# Patient Record
Sex: Female | Born: 1940 | Race: Black or African American | Hispanic: No | Smoking: Former smoker
Health system: Southern US, Community
[De-identification: ages and names within clinical notes are randomized; demographics above are authoritative.]

## PROBLEM LIST (undated history)

## (undated) DIAGNOSIS — S82891A Other fracture of right lower leg, initial encounter for closed fracture: Secondary | ICD-10-CM

## (undated) DIAGNOSIS — D649 Anemia, unspecified: Secondary | ICD-10-CM

## (undated) DIAGNOSIS — M51369 Other intervertebral disc degeneration, lumbar region without mention of lumbar back pain or lower extremity pain: Secondary | ICD-10-CM

## (undated) DIAGNOSIS — R269 Unspecified abnormalities of gait and mobility: Secondary | ICD-10-CM

## (undated) DIAGNOSIS — S42302A Unspecified fracture of shaft of humerus, left arm, initial encounter for closed fracture: Secondary | ICD-10-CM

## (undated) DIAGNOSIS — M5136 Other intervertebral disc degeneration, lumbar region: Secondary | ICD-10-CM

## (undated) DIAGNOSIS — M545 Low back pain, unspecified: Secondary | ICD-10-CM

## (undated) DIAGNOSIS — Z66 Do not resuscitate: Secondary | ICD-10-CM

## (undated) DIAGNOSIS — G4730 Sleep apnea, unspecified: Secondary | ICD-10-CM

## (undated) DIAGNOSIS — G4733 Obstructive sleep apnea (adult) (pediatric): Secondary | ICD-10-CM

## (undated) DIAGNOSIS — R590 Localized enlarged lymph nodes: Secondary | ICD-10-CM

## (undated) DIAGNOSIS — M5030 Other cervical disc degeneration, unspecified cervical region: Secondary | ICD-10-CM

## (undated) DIAGNOSIS — R413 Other amnesia: Secondary | ICD-10-CM

## (undated) DIAGNOSIS — M1990 Unspecified osteoarthritis, unspecified site: Secondary | ICD-10-CM

## (undated) DIAGNOSIS — R739 Hyperglycemia, unspecified: Secondary | ICD-10-CM

## (undated) DIAGNOSIS — L700 Acne vulgaris: Secondary | ICD-10-CM

## (undated) DIAGNOSIS — Z9889 Other specified postprocedural states: Secondary | ICD-10-CM

## (undated) DIAGNOSIS — F329 Major depressive disorder, single episode, unspecified: Secondary | ICD-10-CM

## (undated) DIAGNOSIS — F0390 Unspecified dementia without behavioral disturbance: Secondary | ICD-10-CM

## (undated) DIAGNOSIS — Z9989 Dependence on other enabling machines and devices: Secondary | ICD-10-CM

## (undated) DIAGNOSIS — F32A Depression, unspecified: Secondary | ICD-10-CM

## (undated) DIAGNOSIS — K219 Gastro-esophageal reflux disease without esophagitis: Secondary | ICD-10-CM

## (undated) DIAGNOSIS — I10 Essential (primary) hypertension: Secondary | ICD-10-CM

## (undated) DIAGNOSIS — F419 Anxiety disorder, unspecified: Secondary | ICD-10-CM

## (undated) DIAGNOSIS — J449 Chronic obstructive pulmonary disease, unspecified: Secondary | ICD-10-CM

## (undated) DIAGNOSIS — B351 Tinea unguium: Secondary | ICD-10-CM

## (undated) HISTORY — DX: Major depressive disorder, single episode, unspecified: F32.9

## (undated) HISTORY — PX: OTHER SURGICAL HISTORY: SHX169

## (undated) HISTORY — DX: Unspecified abnormalities of gait and mobility: R26.9

## (undated) HISTORY — DX: Dependence on other enabling machines and devices: Z99.89

## (undated) HISTORY — DX: Other specified postprocedural states: Z98.890

## (undated) HISTORY — DX: Depression, unspecified: F32.A

## (undated) HISTORY — DX: Unspecified osteoarthritis, unspecified site: M19.90

## (undated) HISTORY — DX: Other intervertebral disc degeneration, lumbar region: M51.36

## (undated) HISTORY — DX: Low back pain: M54.5

## (undated) HISTORY — DX: Chronic obstructive pulmonary disease, unspecified: J44.9

## (undated) HISTORY — DX: Low back pain, unspecified: M54.50

## (undated) HISTORY — DX: Obstructive sleep apnea (adult) (pediatric): G47.33

## (undated) HISTORY — DX: Anxiety disorder, unspecified: F41.9

## (undated) HISTORY — DX: Essential (primary) hypertension: I10

## (undated) HISTORY — DX: Gastro-esophageal reflux disease without esophagitis: K21.9

## (undated) HISTORY — DX: Acne vulgaris: L70.0

## (undated) HISTORY — DX: Hyperglycemia, unspecified: R73.9

## (undated) HISTORY — DX: Other amnesia: R41.3

## (undated) HISTORY — DX: Anemia, unspecified: D64.9

## (undated) HISTORY — DX: Localized enlarged lymph nodes: R59.0

## (undated) HISTORY — DX: Other cervical disc degeneration, unspecified cervical region: M50.30

## (undated) HISTORY — DX: Unspecified fracture of shaft of humerus, left arm, initial encounter for closed fracture: S42.302A

## (undated) HISTORY — PX: APPENDECTOMY: SHX54

## (undated) HISTORY — DX: Other intervertebral disc degeneration, lumbar region without mention of lumbar back pain or lower extremity pain: M51.369

## (undated) HISTORY — DX: Tinea unguium: B35.1

## (undated) HISTORY — DX: Other fracture of right lower leg, initial encounter for closed fracture: S82.891A

---

## 1974-11-15 HISTORY — PX: OOPHORECTOMY: SHX86

## 1974-11-15 HISTORY — PX: ABDOMINAL HYSTERECTOMY: SHX81

## 2001-11-01 ENCOUNTER — Ambulatory Visit (HOSPITAL_COMMUNITY): Admission: RE | Admit: 2001-11-01 | Discharge: 2001-11-01 | Payer: Self-pay | Admitting: Family Medicine

## 2001-11-01 ENCOUNTER — Encounter: Payer: Self-pay | Admitting: Family Medicine

## 2002-07-09 ENCOUNTER — Emergency Department (HOSPITAL_COMMUNITY): Admission: EM | Admit: 2002-07-09 | Discharge: 2002-07-09 | Payer: Self-pay

## 2002-11-05 ENCOUNTER — Encounter: Payer: Self-pay | Admitting: Family Medicine

## 2002-11-05 ENCOUNTER — Ambulatory Visit (HOSPITAL_COMMUNITY): Admission: RE | Admit: 2002-11-05 | Discharge: 2002-11-05 | Payer: Self-pay | Admitting: Family Medicine

## 2004-09-29 ENCOUNTER — Ambulatory Visit (HOSPITAL_COMMUNITY): Admission: RE | Admit: 2004-09-29 | Discharge: 2004-09-29 | Payer: Self-pay | Admitting: Neurology

## 2004-11-24 ENCOUNTER — Ambulatory Visit (HOSPITAL_COMMUNITY): Admission: RE | Admit: 2004-11-24 | Discharge: 2004-11-24 | Payer: Self-pay | Admitting: Family Medicine

## 2005-01-06 ENCOUNTER — Ambulatory Visit (HOSPITAL_COMMUNITY): Admission: RE | Admit: 2005-01-06 | Discharge: 2005-01-06 | Payer: Self-pay | Admitting: Neurology

## 2005-03-04 ENCOUNTER — Encounter (INDEPENDENT_AMBULATORY_CARE_PROVIDER_SITE_OTHER): Payer: Self-pay | Admitting: Internal Medicine

## 2005-04-07 ENCOUNTER — Emergency Department (HOSPITAL_COMMUNITY): Admission: EM | Admit: 2005-04-07 | Discharge: 2005-04-07 | Payer: Self-pay | Admitting: Emergency Medicine

## 2005-06-22 ENCOUNTER — Ambulatory Visit (HOSPITAL_COMMUNITY): Admission: RE | Admit: 2005-06-22 | Discharge: 2005-06-22 | Payer: Self-pay | Admitting: Family Medicine

## 2005-10-11 ENCOUNTER — Emergency Department (HOSPITAL_COMMUNITY): Admission: EM | Admit: 2005-10-11 | Discharge: 2005-10-11 | Payer: Self-pay | Admitting: Emergency Medicine

## 2005-10-14 LAB — CONVERTED CEMR LAB
Cholesterol: 229 mg/dL
HDL: 51 mg/dL
LDL Cholesterol: 136 mg/dL
Triglycerides: 211 mg/dL

## 2005-11-03 ENCOUNTER — Ambulatory Visit: Payer: Self-pay | Admitting: Orthopedic Surgery

## 2005-12-27 ENCOUNTER — Ambulatory Visit (HOSPITAL_COMMUNITY): Admission: RE | Admit: 2005-12-27 | Discharge: 2005-12-27 | Payer: Self-pay | Admitting: Family Medicine

## 2006-02-27 ENCOUNTER — Encounter (INDEPENDENT_AMBULATORY_CARE_PROVIDER_SITE_OTHER): Payer: Self-pay | Admitting: Internal Medicine

## 2006-03-01 LAB — CONVERTED CEMR LAB
Basophils Absolute: 0.1 10*3/uL
Hemoglobin: 12.8 g/dL
Lymphocytes Relative: 26 %
Monocytes Absolute: 0.5 10*3/uL
Neutro Abs: 4.8 10*3/uL
Neutrophils Relative %: 64.7 %
Platelets: 303 10*3/uL
RDW: 14.8 %

## 2006-05-21 ENCOUNTER — Emergency Department (HOSPITAL_COMMUNITY): Admission: EM | Admit: 2006-05-21 | Discharge: 2006-05-21 | Payer: Self-pay | Admitting: Emergency Medicine

## 2006-06-23 ENCOUNTER — Ambulatory Visit: Payer: Self-pay | Admitting: Internal Medicine

## 2006-08-04 ENCOUNTER — Ambulatory Visit: Payer: Self-pay | Admitting: Internal Medicine

## 2006-08-04 LAB — CONVERTED CEMR LAB
AST: 13 units/L
Albumin: 4.2 g/dL
BUN: 20 mg/dL
Calcium: 9.4 mg/dL
Chloride: 100 meq/L
Potassium: 3.4 meq/L

## 2006-08-08 ENCOUNTER — Encounter (INDEPENDENT_AMBULATORY_CARE_PROVIDER_SITE_OTHER): Payer: Self-pay | Admitting: Internal Medicine

## 2006-09-12 ENCOUNTER — Ambulatory Visit: Payer: Self-pay | Admitting: Internal Medicine

## 2006-10-05 DIAGNOSIS — M545 Low back pain: Secondary | ICD-10-CM

## 2006-10-05 DIAGNOSIS — J309 Allergic rhinitis, unspecified: Secondary | ICD-10-CM | POA: Insufficient documentation

## 2006-10-05 DIAGNOSIS — I10 Essential (primary) hypertension: Secondary | ICD-10-CM | POA: Insufficient documentation

## 2006-10-05 DIAGNOSIS — K219 Gastro-esophageal reflux disease without esophagitis: Secondary | ICD-10-CM

## 2006-10-05 DIAGNOSIS — F411 Generalized anxiety disorder: Secondary | ICD-10-CM | POA: Insufficient documentation

## 2006-10-05 DIAGNOSIS — D649 Anemia, unspecified: Secondary | ICD-10-CM

## 2006-10-05 DIAGNOSIS — B351 Tinea unguium: Secondary | ICD-10-CM

## 2006-10-05 DIAGNOSIS — M159 Polyosteoarthritis, unspecified: Secondary | ICD-10-CM | POA: Insufficient documentation

## 2006-10-05 DIAGNOSIS — M129 Arthropathy, unspecified: Secondary | ICD-10-CM | POA: Insufficient documentation

## 2006-10-05 DIAGNOSIS — G4733 Obstructive sleep apnea (adult) (pediatric): Secondary | ICD-10-CM

## 2006-10-05 DIAGNOSIS — J441 Chronic obstructive pulmonary disease with (acute) exacerbation: Secondary | ICD-10-CM | POA: Insufficient documentation

## 2006-10-26 ENCOUNTER — Ambulatory Visit: Payer: Self-pay | Admitting: Internal Medicine

## 2006-10-28 ENCOUNTER — Encounter (INDEPENDENT_AMBULATORY_CARE_PROVIDER_SITE_OTHER): Payer: Self-pay | Admitting: Internal Medicine

## 2006-11-28 ENCOUNTER — Encounter (INDEPENDENT_AMBULATORY_CARE_PROVIDER_SITE_OTHER): Payer: Self-pay | Admitting: Internal Medicine

## 2007-01-05 ENCOUNTER — Ambulatory Visit (HOSPITAL_COMMUNITY): Admission: RE | Admit: 2007-01-05 | Discharge: 2007-01-05 | Payer: Self-pay | Admitting: Internal Medicine

## 2007-01-06 ENCOUNTER — Encounter (INDEPENDENT_AMBULATORY_CARE_PROVIDER_SITE_OTHER): Payer: Self-pay | Admitting: Internal Medicine

## 2007-01-25 ENCOUNTER — Ambulatory Visit: Payer: Self-pay | Admitting: Internal Medicine

## 2007-01-25 DIAGNOSIS — E785 Hyperlipidemia, unspecified: Secondary | ICD-10-CM | POA: Insufficient documentation

## 2007-02-15 ENCOUNTER — Ambulatory Visit: Payer: Self-pay | Admitting: Internal Medicine

## 2007-02-24 ENCOUNTER — Telehealth (INDEPENDENT_AMBULATORY_CARE_PROVIDER_SITE_OTHER): Payer: Self-pay | Admitting: *Deleted

## 2007-03-17 ENCOUNTER — Ambulatory Visit: Payer: Self-pay | Admitting: Internal Medicine

## 2007-03-20 ENCOUNTER — Telehealth (INDEPENDENT_AMBULATORY_CARE_PROVIDER_SITE_OTHER): Payer: Self-pay | Admitting: *Deleted

## 2007-03-28 ENCOUNTER — Ambulatory Visit: Payer: Self-pay | Admitting: Internal Medicine

## 2007-04-25 ENCOUNTER — Ambulatory Visit: Payer: Self-pay | Admitting: Internal Medicine

## 2007-05-01 ENCOUNTER — Encounter (INDEPENDENT_AMBULATORY_CARE_PROVIDER_SITE_OTHER): Payer: Self-pay | Admitting: Internal Medicine

## 2007-05-23 ENCOUNTER — Ambulatory Visit: Payer: Self-pay | Admitting: Internal Medicine

## 2007-06-20 ENCOUNTER — Ambulatory Visit: Payer: Self-pay | Admitting: Internal Medicine

## 2007-06-20 DIAGNOSIS — R0902 Hypoxemia: Secondary | ICD-10-CM

## 2007-07-24 ENCOUNTER — Ambulatory Visit: Payer: Self-pay | Admitting: Orthopedic Surgery

## 2007-08-22 ENCOUNTER — Ambulatory Visit: Payer: Self-pay | Admitting: Internal Medicine

## 2007-08-22 ENCOUNTER — Inpatient Hospital Stay (HOSPITAL_COMMUNITY): Admission: EM | Admit: 2007-08-22 | Discharge: 2007-08-25 | Payer: Self-pay | Admitting: Emergency Medicine

## 2007-08-22 DIAGNOSIS — R0602 Shortness of breath: Secondary | ICD-10-CM

## 2007-08-22 LAB — CONVERTED CEMR LAB
ALT: 36 units/L — ABNORMAL HIGH (ref 0–35)
Alkaline Phosphatase: 113 units/L (ref 39–117)
Basophils Relative: 1 % (ref 0–1)
Creatinine, Ser: 0.85 mg/dL (ref 0.40–1.20)
Eosinophils Absolute: 0 10*3/uL (ref 0.0–0.7)
Eosinophils Relative: 0 % (ref 0–5)
Glucose, Bld: 111 mg/dL — ABNORMAL HIGH (ref 70–99)
MCHC: 29.9 g/dL — ABNORMAL LOW (ref 30.0–36.0)
MCV: 73.9 fL — ABNORMAL LOW (ref 78.0–100.0)
Neutrophils Relative %: 65 % (ref 43–77)
Platelets: 343 10*3/uL (ref 150–400)
RDW: 18.5 % — ABNORMAL HIGH (ref 11.5–14.0)
Sodium: 139 meq/L (ref 135–145)
Total Bilirubin: 0.4 mg/dL (ref 0.3–1.2)
Total Protein: 6.6 g/dL (ref 6.0–8.3)

## 2007-09-04 ENCOUNTER — Telehealth (INDEPENDENT_AMBULATORY_CARE_PROVIDER_SITE_OTHER): Payer: Self-pay | Admitting: *Deleted

## 2007-09-11 ENCOUNTER — Ambulatory Visit: Payer: Self-pay | Admitting: Internal Medicine

## 2007-09-11 DIAGNOSIS — R7309 Other abnormal glucose: Secondary | ICD-10-CM | POA: Insufficient documentation

## 2007-09-20 ENCOUNTER — Ambulatory Visit (HOSPITAL_COMMUNITY): Admission: RE | Admit: 2007-09-20 | Discharge: 2007-09-20 | Payer: Self-pay | Admitting: Internal Medicine

## 2007-09-20 ENCOUNTER — Encounter (INDEPENDENT_AMBULATORY_CARE_PROVIDER_SITE_OTHER): Payer: Self-pay | Admitting: Internal Medicine

## 2007-09-21 ENCOUNTER — Encounter (INDEPENDENT_AMBULATORY_CARE_PROVIDER_SITE_OTHER): Payer: Self-pay | Admitting: Internal Medicine

## 2007-09-25 ENCOUNTER — Telehealth (INDEPENDENT_AMBULATORY_CARE_PROVIDER_SITE_OTHER): Payer: Self-pay | Admitting: Internal Medicine

## 2007-09-25 LAB — CONVERTED CEMR LAB
BUN: 15 mg/dL (ref 6–23)
CO2: 27 meq/L (ref 19–32)
Chloride: 102 meq/L (ref 96–112)
Eosinophils Absolute: 0.2 10*3/uL (ref 0.0–0.7)
Ferritin: 33 ng/mL (ref 10–291)
Glucose, Bld: 90 mg/dL (ref 70–99)
Lymphocytes Relative: 27 % (ref 12–46)
Lymphs Abs: 1.4 10*3/uL (ref 0.7–3.3)
MCV: 76.2 fL — ABNORMAL LOW (ref 78.0–100.0)
Monocytes Relative: 5 % (ref 3–11)
Neutrophils Relative %: 65 % (ref 43–77)
Platelets: 233 10*3/uL (ref 150–400)
Potassium: 4.3 meq/L (ref 3.5–5.3)
RBC: 5.25 M/uL — ABNORMAL HIGH (ref 3.87–5.11)
Saturation Ratios: 25 % (ref 20–55)
Sodium: 142 meq/L (ref 135–145)
UIBC: 282 ug/dL
WBC: 5.4 10*3/uL (ref 4.0–10.5)

## 2007-09-27 ENCOUNTER — Encounter (INDEPENDENT_AMBULATORY_CARE_PROVIDER_SITE_OTHER): Payer: Self-pay | Admitting: Internal Medicine

## 2007-09-28 ENCOUNTER — Encounter (INDEPENDENT_AMBULATORY_CARE_PROVIDER_SITE_OTHER): Payer: Self-pay | Admitting: Internal Medicine

## 2007-10-31 ENCOUNTER — Ambulatory Visit: Payer: Self-pay | Admitting: Internal Medicine

## 2007-11-01 ENCOUNTER — Telehealth (INDEPENDENT_AMBULATORY_CARE_PROVIDER_SITE_OTHER): Payer: Self-pay | Admitting: *Deleted

## 2007-11-01 LAB — CONVERTED CEMR LAB
BUN: 18 mg/dL (ref 6–23)
Basophils Absolute: 0 10*3/uL (ref 0.0–0.1)
Basophils Relative: 0 % (ref 0–1)
Calcium: 9.5 mg/dL (ref 8.4–10.5)
Chloride: 103 meq/L (ref 96–112)
Creatinine, Ser: 0.77 mg/dL (ref 0.40–1.20)
Eosinophils Absolute: 0.1 10*3/uL — ABNORMAL LOW (ref 0.2–0.7)
Eosinophils Relative: 2 % (ref 0–5)
HCT: 39.6 % (ref 36.0–46.0)
Hemoglobin: 11.5 g/dL — ABNORMAL LOW (ref 12.0–15.0)
MCHC: 29 g/dL — ABNORMAL LOW (ref 30.0–36.0)
MCV: 77.6 fL — ABNORMAL LOW (ref 78.0–100.0)
Monocytes Absolute: 0.3 10*3/uL (ref 0.1–1.0)
Monocytes Relative: 5 % (ref 3–12)
Neutro Abs: 3.6 10*3/uL (ref 1.7–7.7)
RBC: 5.1 M/uL (ref 3.87–5.11)
RDW: 23.9 % — ABNORMAL HIGH (ref 11.5–15.5)

## 2007-11-06 ENCOUNTER — Telehealth (INDEPENDENT_AMBULATORY_CARE_PROVIDER_SITE_OTHER): Payer: Self-pay | Admitting: Internal Medicine

## 2007-11-16 DIAGNOSIS — R590 Localized enlarged lymph nodes: Secondary | ICD-10-CM

## 2007-11-16 HISTORY — DX: Localized enlarged lymph nodes: R59.0

## 2007-11-23 ENCOUNTER — Ambulatory Visit (HOSPITAL_COMMUNITY): Admission: RE | Admit: 2007-11-23 | Discharge: 2007-11-23 | Payer: Self-pay | Admitting: Internal Medicine

## 2007-12-18 ENCOUNTER — Ambulatory Visit: Payer: Self-pay | Admitting: Internal Medicine

## 2007-12-18 DIAGNOSIS — J441 Chronic obstructive pulmonary disease with (acute) exacerbation: Secondary | ICD-10-CM

## 2007-12-18 DIAGNOSIS — K117 Disturbances of salivary secretion: Secondary | ICD-10-CM

## 2008-01-03 ENCOUNTER — Encounter (INDEPENDENT_AMBULATORY_CARE_PROVIDER_SITE_OTHER): Payer: Self-pay | Admitting: Internal Medicine

## 2008-01-11 ENCOUNTER — Telehealth (INDEPENDENT_AMBULATORY_CARE_PROVIDER_SITE_OTHER): Payer: Self-pay | Admitting: *Deleted

## 2008-01-11 ENCOUNTER — Ambulatory Visit (HOSPITAL_COMMUNITY): Admission: RE | Admit: 2008-01-11 | Discharge: 2008-01-11 | Payer: Self-pay | Admitting: Internal Medicine

## 2008-01-16 ENCOUNTER — Telehealth (INDEPENDENT_AMBULATORY_CARE_PROVIDER_SITE_OTHER): Payer: Self-pay | Admitting: *Deleted

## 2008-01-18 ENCOUNTER — Ambulatory Visit: Payer: Self-pay | Admitting: Internal Medicine

## 2008-01-18 DIAGNOSIS — M5430 Sciatica, unspecified side: Secondary | ICD-10-CM | POA: Insufficient documentation

## 2008-02-16 ENCOUNTER — Ambulatory Visit: Payer: Self-pay | Admitting: Internal Medicine

## 2008-02-28 ENCOUNTER — Encounter (INDEPENDENT_AMBULATORY_CARE_PROVIDER_SITE_OTHER): Payer: Self-pay | Admitting: Internal Medicine

## 2008-03-26 ENCOUNTER — Ambulatory Visit: Payer: Self-pay | Admitting: Internal Medicine

## 2008-05-16 ENCOUNTER — Ambulatory Visit: Payer: Self-pay | Admitting: Internal Medicine

## 2008-05-20 LAB — CONVERTED CEMR LAB
BUN: 21 mg/dL (ref 6–23)
CO2: 28 meq/L (ref 19–32)
Calcium: 9.6 mg/dL (ref 8.4–10.5)
Chloride: 105 meq/L (ref 96–112)
Cholesterol: 250 mg/dL — ABNORMAL HIGH (ref 0–200)
Creatinine, Ser: 0.84 mg/dL (ref 0.40–1.20)
Glucose, Bld: 95 mg/dL (ref 70–99)
HDL: 60 mg/dL (ref >39)
LDL Cholesterol: 166 mg/dL — ABNORMAL HIGH (ref 0–99)
Potassium: 4.1 meq/L (ref 3.5–5.3)
Sodium: 143 meq/L (ref 135–145)
TSH: 0.372 microintl units/mL (ref 0.350–4.50)
Total CHOL/HDL Ratio: 4.2
Triglycerides: 120 mg/dL (ref <150)
VLDL: 24 mg/dL (ref 0–40)

## 2008-07-04 ENCOUNTER — Ambulatory Visit: Payer: Self-pay | Admitting: Internal Medicine

## 2008-07-04 DIAGNOSIS — L708 Other acne: Secondary | ICD-10-CM

## 2008-07-08 ENCOUNTER — Encounter (INDEPENDENT_AMBULATORY_CARE_PROVIDER_SITE_OTHER): Payer: Self-pay | Admitting: Internal Medicine

## 2008-07-08 LAB — CONVERTED CEMR LAB
Basophils Absolute: 0 10*3/uL (ref 0.0–0.1)
Basophils Relative: 1 % (ref 0–1)
Eosinophils Absolute: 0.1 10*3/uL (ref 0.0–0.7)
Hemoglobin: 11.3 g/dL — ABNORMAL LOW (ref 12.0–15.0)
MCHC: 29.5 g/dL — ABNORMAL LOW (ref 30.0–36.0)
MCV: 83.3 fL (ref 78.0–100.0)
Monocytes Absolute: 0.3 10*3/uL (ref 0.1–1.0)
Monocytes Relative: 5 % (ref 3–12)
RBC: 4.6 M/uL (ref 3.87–5.11)
RDW: 15.5 % (ref 11.5–15.5)

## 2008-08-01 ENCOUNTER — Ambulatory Visit: Payer: Self-pay | Admitting: Internal Medicine

## 2008-10-03 ENCOUNTER — Ambulatory Visit: Payer: Self-pay | Admitting: Internal Medicine

## 2008-10-31 ENCOUNTER — Ambulatory Visit: Payer: Self-pay | Admitting: Internal Medicine

## 2008-11-28 ENCOUNTER — Encounter (INDEPENDENT_AMBULATORY_CARE_PROVIDER_SITE_OTHER): Payer: Self-pay | Admitting: Internal Medicine

## 2008-11-29 ENCOUNTER — Encounter (INDEPENDENT_AMBULATORY_CARE_PROVIDER_SITE_OTHER): Payer: Self-pay | Admitting: *Deleted

## 2008-11-29 LAB — CONVERTED CEMR LAB
BUN: 18 mg/dL (ref 6–23)
CO2: 28 meq/L (ref 19–32)
Calcium: 9 mg/dL (ref 8.4–10.5)
Chloride: 102 meq/L (ref 96–112)
Cholesterol: 251 mg/dL — ABNORMAL HIGH (ref 0–200)
Creatinine, Ser: 0.81 mg/dL (ref 0.40–1.20)
Glucose, Bld: 85 mg/dL (ref 70–99)
HDL: 73 mg/dL (ref >39)
LDL Cholesterol: 158 mg/dL — ABNORMAL HIGH (ref 0–99)
Potassium: 4.1 meq/L (ref 3.5–5.3)
Sodium: 143 meq/L (ref 135–145)
Total CHOL/HDL Ratio: 3.4
Triglycerides: 99 mg/dL (ref <150)
VLDL: 20 mg/dL (ref 0–40)

## 2009-01-11 ENCOUNTER — Emergency Department (HOSPITAL_COMMUNITY): Admission: EM | Admit: 2009-01-11 | Discharge: 2009-01-11 | Payer: Self-pay | Admitting: Emergency Medicine

## 2009-01-11 ENCOUNTER — Encounter: Payer: Self-pay | Admitting: Orthopedic Surgery

## 2009-01-13 ENCOUNTER — Ambulatory Visit: Payer: Self-pay | Admitting: Orthopedic Surgery

## 2009-01-20 ENCOUNTER — Ambulatory Visit: Payer: Self-pay | Admitting: Internal Medicine

## 2009-01-27 ENCOUNTER — Ambulatory Visit (HOSPITAL_COMMUNITY): Admission: RE | Admit: 2009-01-27 | Discharge: 2009-01-27 | Payer: Self-pay | Admitting: Internal Medicine

## 2009-01-29 ENCOUNTER — Encounter (INDEPENDENT_AMBULATORY_CARE_PROVIDER_SITE_OTHER): Payer: Self-pay | Admitting: Internal Medicine

## 2009-02-03 ENCOUNTER — Encounter (INDEPENDENT_AMBULATORY_CARE_PROVIDER_SITE_OTHER): Payer: Self-pay | Admitting: Internal Medicine

## 2009-02-03 ENCOUNTER — Telehealth (INDEPENDENT_AMBULATORY_CARE_PROVIDER_SITE_OTHER): Payer: Self-pay | Admitting: Internal Medicine

## 2009-02-04 ENCOUNTER — Encounter (INDEPENDENT_AMBULATORY_CARE_PROVIDER_SITE_OTHER): Payer: Self-pay | Admitting: Internal Medicine

## 2009-02-05 ENCOUNTER — Encounter (INDEPENDENT_AMBULATORY_CARE_PROVIDER_SITE_OTHER): Payer: Self-pay | Admitting: Internal Medicine

## 2009-02-06 ENCOUNTER — Encounter (INDEPENDENT_AMBULATORY_CARE_PROVIDER_SITE_OTHER): Payer: Self-pay | Admitting: Internal Medicine

## 2009-03-04 ENCOUNTER — Encounter (INDEPENDENT_AMBULATORY_CARE_PROVIDER_SITE_OTHER): Payer: Self-pay | Admitting: Internal Medicine

## 2009-03-05 ENCOUNTER — Encounter (INDEPENDENT_AMBULATORY_CARE_PROVIDER_SITE_OTHER): Payer: Self-pay | Admitting: Internal Medicine

## 2009-04-21 ENCOUNTER — Encounter (INDEPENDENT_AMBULATORY_CARE_PROVIDER_SITE_OTHER): Payer: Self-pay | Admitting: Internal Medicine

## 2009-04-23 ENCOUNTER — Ambulatory Visit: Payer: Self-pay | Admitting: Internal Medicine

## 2009-04-23 LAB — CONVERTED CEMR LAB
Glucose, Urine, Semiquant: NEGATIVE
Urobilinogen, UA: 1

## 2009-06-30 ENCOUNTER — Ambulatory Visit: Payer: Self-pay | Admitting: Internal Medicine

## 2009-07-31 ENCOUNTER — Ambulatory Visit: Payer: Self-pay | Admitting: Internal Medicine

## 2009-08-01 ENCOUNTER — Encounter (INDEPENDENT_AMBULATORY_CARE_PROVIDER_SITE_OTHER): Payer: Self-pay | Admitting: Internal Medicine

## 2009-08-01 LAB — CONVERTED CEMR LAB
ALT: 16 units/L (ref 0–35)
AST: 17 units/L (ref 0–37)
Calcium: 9.1 mg/dL (ref 8.4–10.5)
Chloride: 102 meq/L (ref 96–112)
Creatinine, Ser: 0.89 mg/dL (ref 0.40–1.20)
Potassium: 4.2 meq/L (ref 3.5–5.3)
Sodium: 143 meq/L (ref 135–145)
Total CHOL/HDL Ratio: 4.2
Total Protein: 7.2 g/dL (ref 6.0–8.3)

## 2009-08-29 ENCOUNTER — Encounter: Payer: Self-pay | Admitting: Family Medicine

## 2009-10-20 ENCOUNTER — Ambulatory Visit: Payer: Self-pay | Admitting: Family Medicine

## 2009-10-21 ENCOUNTER — Encounter: Payer: Self-pay | Admitting: Family Medicine

## 2009-10-31 ENCOUNTER — Telehealth: Payer: Self-pay | Admitting: Family Medicine

## 2009-11-28 ENCOUNTER — Encounter: Payer: Self-pay | Admitting: Family Medicine

## 2009-12-09 ENCOUNTER — Encounter: Payer: Self-pay | Admitting: Family Medicine

## 2010-01-20 ENCOUNTER — Ambulatory Visit: Payer: Self-pay | Admitting: Family Medicine

## 2010-01-20 LAB — CONVERTED CEMR LAB
Bilirubin Urine: NEGATIVE
Glucose, Urine, Semiquant: NEGATIVE
pH: 6.5

## 2010-01-21 ENCOUNTER — Encounter: Payer: Self-pay | Admitting: Family Medicine

## 2010-01-23 LAB — CONVERTED CEMR LAB
ALT: 15 units/L (ref 0–35)
AST: 20 units/L (ref 0–37)
Albumin: 4.3 g/dL (ref 3.5–5.2)
Alkaline Phosphatase: 86 units/L (ref 39–117)
Chloride: 100 meq/L (ref 96–112)
Creatinine, Ser: 0.95 mg/dL (ref 0.40–1.20)
Eosinophils Absolute: 0.2 10*3/uL (ref 0.0–0.7)
HDL: 65 mg/dL (ref >39)
LDL Cholesterol: 111 mg/dL — ABNORMAL HIGH (ref 0–99)
Lymphocytes Relative: 23 % (ref 12–46)
Lymphs Abs: 1.1 10*3/uL (ref 0.7–4.0)
MCV: 85.9 fL (ref 78.0–100.0)
Neutrophils Relative %: 69 % (ref 43–77)
Platelets: 269 10*3/uL (ref 150–400)
Total Protein: 6.9 g/dL (ref 6.0–8.3)
Triglycerides: 95 mg/dL (ref <150)
WBC: 4.8 10*3/uL (ref 4.0–10.5)

## 2010-01-25 DIAGNOSIS — N3000 Acute cystitis without hematuria: Secondary | ICD-10-CM

## 2010-01-29 ENCOUNTER — Ambulatory Visit (HOSPITAL_COMMUNITY): Admission: RE | Admit: 2010-01-29 | Discharge: 2010-01-29 | Payer: Self-pay | Admitting: Family Medicine

## 2010-02-03 ENCOUNTER — Ambulatory Visit: Payer: Self-pay | Admitting: Family Medicine

## 2010-02-03 DIAGNOSIS — R110 Nausea: Secondary | ICD-10-CM

## 2010-02-03 DIAGNOSIS — R42 Dizziness and giddiness: Secondary | ICD-10-CM | POA: Insufficient documentation

## 2010-02-03 LAB — CONVERTED CEMR LAB
Bilirubin Urine: NEGATIVE
Protein, U semiquant: NEGATIVE
Specific Gravity, Urine: 1.015
pH: 5.5

## 2010-02-04 ENCOUNTER — Encounter: Payer: Self-pay | Admitting: Family Medicine

## 2010-02-17 ENCOUNTER — Ambulatory Visit: Payer: Self-pay | Admitting: Family Medicine

## 2010-02-17 DIAGNOSIS — R0989 Other specified symptoms and signs involving the circulatory and respiratory systems: Secondary | ICD-10-CM

## 2010-02-23 ENCOUNTER — Encounter: Payer: Self-pay | Admitting: Gastroenterology

## 2010-02-24 ENCOUNTER — Ambulatory Visit (HOSPITAL_COMMUNITY): Admission: RE | Admit: 2010-02-24 | Discharge: 2010-02-24 | Payer: Self-pay | Admitting: Family Medicine

## 2010-02-25 ENCOUNTER — Encounter: Payer: Self-pay | Admitting: Family Medicine

## 2010-02-26 ENCOUNTER — Encounter: Payer: Self-pay | Admitting: Family Medicine

## 2010-03-04 ENCOUNTER — Encounter (HOSPITAL_COMMUNITY): Admission: RE | Admit: 2010-03-04 | Discharge: 2010-04-03 | Payer: Self-pay | Admitting: Family Medicine

## 2010-03-19 ENCOUNTER — Ambulatory Visit: Payer: Self-pay | Admitting: Family Medicine

## 2010-04-10 ENCOUNTER — Ambulatory Visit: Payer: Self-pay | Admitting: Family Medicine

## 2010-04-10 DIAGNOSIS — J301 Allergic rhinitis due to pollen: Secondary | ICD-10-CM | POA: Insufficient documentation

## 2010-04-23 ENCOUNTER — Ambulatory Visit: Payer: Self-pay | Admitting: Family Medicine

## 2010-04-24 ENCOUNTER — Encounter: Payer: Self-pay | Admitting: Family Medicine

## 2010-06-03 ENCOUNTER — Encounter: Payer: Self-pay | Admitting: Family Medicine

## 2010-06-23 ENCOUNTER — Ambulatory Visit: Payer: Self-pay | Admitting: Family Medicine

## 2010-06-23 DIAGNOSIS — M949 Disorder of cartilage, unspecified: Secondary | ICD-10-CM

## 2010-06-23 DIAGNOSIS — M899 Disorder of bone, unspecified: Secondary | ICD-10-CM | POA: Insufficient documentation

## 2010-07-03 ENCOUNTER — Ambulatory Visit: Payer: Self-pay | Admitting: Family Medicine

## 2010-07-03 DIAGNOSIS — L259 Unspecified contact dermatitis, unspecified cause: Secondary | ICD-10-CM

## 2010-07-15 LAB — CONVERTED CEMR LAB
AST: 17 units/L (ref 0–37)
Alkaline Phosphatase: 81 units/L (ref 39–117)
Bilirubin, Direct: 0.1 mg/dL (ref 0.0–0.3)
CO2: 30 meq/L (ref 19–32)
Calcium: 9.3 mg/dL (ref 8.4–10.5)
Creatinine, Ser: 0.98 mg/dL (ref 0.40–1.20)
Glucose, Bld: 81 mg/dL (ref 70–99)
LDL Cholesterol: 139 mg/dL — ABNORMAL HIGH (ref 0–99)
Total Bilirubin: 0.5 mg/dL (ref 0.3–1.2)
Vit D, 25-Hydroxy: 27 ng/mL — ABNORMAL LOW (ref 30–89)

## 2010-08-20 ENCOUNTER — Ambulatory Visit: Payer: Self-pay | Admitting: Family Medicine

## 2010-09-02 ENCOUNTER — Telehealth: Payer: Self-pay | Admitting: Family Medicine

## 2010-10-30 ENCOUNTER — Ambulatory Visit: Payer: Self-pay | Admitting: Family Medicine

## 2010-10-30 DIAGNOSIS — M25559 Pain in unspecified hip: Secondary | ICD-10-CM | POA: Insufficient documentation

## 2010-10-30 LAB — CONVERTED CEMR LAB
Bilirubin Urine: NEGATIVE
Glucose, Urine, Semiquant: NEGATIVE
pH: 6

## 2010-10-31 ENCOUNTER — Encounter: Payer: Self-pay | Admitting: Family Medicine

## 2010-11-03 ENCOUNTER — Telehealth: Payer: Self-pay | Admitting: Family Medicine

## 2010-11-04 ENCOUNTER — Encounter: Payer: Self-pay | Admitting: Family Medicine

## 2010-11-13 ENCOUNTER — Encounter: Payer: Self-pay | Admitting: Family Medicine

## 2010-11-13 ENCOUNTER — Telehealth: Payer: Self-pay | Admitting: Family Medicine

## 2010-11-17 ENCOUNTER — Encounter: Payer: Self-pay | Admitting: Family Medicine

## 2010-11-25 ENCOUNTER — Encounter: Payer: Self-pay | Admitting: Family Medicine

## 2010-12-06 ENCOUNTER — Encounter: Payer: Self-pay | Admitting: Family Medicine

## 2010-12-06 ENCOUNTER — Encounter: Payer: Self-pay | Admitting: Neurology

## 2010-12-15 NOTE — Letter (Signed)
Summary: [facility]  [facility]   Imported By: Rudene Anda 02/26/2010 13:28:13  _____________________________________________________________________  External Attachment:    Type:   Image     Comment:   External Document

## 2010-12-15 NOTE — Assessment & Plan Note (Signed)
Summary: office visit   Vital Signs:  Patient profile:   70 year old female Menstrual status:  hysterectomy Height:      71 inches Weight:      255.75 pounds BMI:     35.80 O2 Sat:      94 % on 2 L/min Pulse rate:   112 / minute Pulse rhythm:   regular Resp:     16 per minute BP sitting:   150 / 92  (left arm) Cuff size:   large  Vitals Entered By: Everitt Amber LPN (January 20, 1026 10:47 AM)  Nutrition Counseling: Patient's BMI is greater than 25 and therefore counseled on weight management options.  O2 Flow:  2 L/min CC: Follow up, has been having hot flashes and then nausea and vomiting. Doesn't know whats causing it   Primary Care Provider:  Syliva Overman MD  CC:  Follow up and has been having hot flashes and then nausea and vomiting. Doesn't know whats causing it.  History of Present Illness: Reports  that she has been fair. Denies recent fever or chills. Denies sinus pressure, nasal congestion , ear pain or sore throat. Denies chest congestion, or cough productive of sputum. Denies chest pain, palpitations, PND, orthopnea or leg swelling. Reports abdomiunal  painwith some  nausea, but no  vomitting, diarrhea or constipation. Denies change in bowel movements or bloody stool. reports  dysuria , frequency, incontinence or hesitancy. Denies  joint pain, swelling, or reduced mobility. Denies headaches, vertigo, seizures. Denies depression, anxiety or insomnia. Denies  rash, lesions, or itch.     Allergies: 1)  ! * Benazepril 2)  Pravachol 3)  Cipro  Review of Systems      See HPI GU:  Complains of dysuria and urinary frequency. MS:  Complains of low back pain, mid back pain, and muscle weakness. Psych:  Denies anxiety and depression. Endo:  Denies cold intolerance, excessive hunger, excessive thirst, excessive urination, heat intolerance, polyuria, and weight change. Heme:  Denies abnormal bruising and bleeding. Allergy:  Complains of seasonal  allergies.  Physical Exam  General:  Well-developed,well-nourished,in no acute distress; alert,appropriate and cooperative throughout examination. pt has to use assistive device to ambulate, and when she sits she  sits to one side because of discomfort. HEENT: No facial asymmetry,  EOMI, No sinus tenderness, TM's Clear, oropharynx  pink and moist.   Chest: Clear to auscultation bilaterally.  CVS: S1, S2, No murmurs, No S3.   Abd: Soft, Nontender.  MS: decreased ROM spine,  and left shoulder  Ext: No edema.   CNS: CN 2-12 intact, power tone and sensation normal throughout.   Skin: Intact, no visible lesions or rashes.  Psych: Good eye contact, normal affect.  Memory intact, not anxious or depressed appearing.    Impression & Recommendations:  Problem # 1:  HYPERTENSION (ICD-401.9) Assessment Deteriorated  Her updated medication list for this problem includes:    Verapamil Hcl Cr 240 Mg Tbcr (Verapamil hcl) .[name]... 1 by mouth once daily  BP today: 150/92 Prior BP: 130/70 (10/20/2009)  Prior 10 Yr Risk Heart Disease: 13 % (09/11/2007)  Labs Reviewed: K+: 4.2 (08/01/2009) Creat: : 0.89 (08/01/2009)   Chol: 250 (08/01/2009)   HDL: 60 (08/01/2009)   LDL: 172 (08/01/2009)   TG: 90 (08/01/2009)  Problem # 2:  ALLERGIC RHINITIS (ICD-477.9) Assessment: Unchanged  Her updated medication list for this problem includes:    Fexofenadine Hcl 180 Mg Tabs (Fexofenadine hcl) .[name]... 1 by mouth once daily  Problem # 3:  DEPRESSION (ICD-311) Assessment: Improved  Her updated medication list for this problem includes:    Fluoxetine Hcl 20 Mg Caps (Fluoxetine hcl) .[name]... 1 pill by mouth once daily  Problem # 4:  ACUTE CYSTITIS (ICD-595.0) Assessment: Comment Only  Her updated medication list for this problem includes:    Bactrim Ds 800-160 Mg Tabs (Sulfamethoxazole-trimethoprim) .[name]... Take 1 tablet by mouth two times a day for 7 days  Orders: UA Dipstick W/ Micro (manual)  (81000) T-Culture, Urine ([id number])  Complete Medication List: 1)  Fluoxetine Hcl 20 Mg Caps (Fluoxetine hcl) .[name].. 1 pill by mouth once daily 2)  Verapamil Hcl Cr 240 Mg Tbcr (Verapamil hcl) .[name].. 1 by mouth once daily 3)  Fexofenadine Hcl 180 Mg Tabs (Fexofenadine hcl) .[name].. 1 by mouth once daily 4)  Os-cal Ultra 600 Mg Tabs (Calcium carb-vit d-c-e-mineral) .... Take one tab two times a day 5)  Brovana 15 Mcg/33ml Nebu (Arformoterol tartrate) .[name].. 1 in nebulizer two times a day 6)  Naproxen 500 Mg Tabs (Naproxen) .... Take one tab two times a day as needed 7)  Mevacor 40 Mg Tabs (Lovastatin) .... 2 tablets at bedtime 8)  Omeprazole 40 Mg Cpdr (Omeprazole) .... One cap by mouth once daily 9)  Bactrim Ds 800-160 Mg Tabs (Sulfamethoxazole-trimethoprim) .... Take 1 tablet by mouth two times a day for 7 days  Other Orders: T-Basic Metabolic Panel [id number]) T-Hepatic Function [phone]) T-Lipid Profile [id number]) T-TSH [id number]) T-CBC w/Diff [id number]) T- Hemoglobin A1C [id number]) T-Vitamin D (25-Hydroxy) [phone])  Patient Instructions: 1)  Please schedule a follow-up appointment in 3 months. 2)  You need to lose weight. Consider a lower calorie diet and regular exercise.  3)  BMP prior to visit, ICD-9: 4)  Hepatic Panel prior to visit, ICD-9: 5)  Lipid Panel prior to visit, ICD-9: 6)  TSH prior to visit, ICD-9:  fasting past due, pls do asap 7)  CBC w/ Diff prior to visit, ICD-9: 8)  Vit D level 9)  HbgA1C prior to visit, ICD-9: 10)  Schedule a colonoscopy/sigmoidoscopy to help detect colon cancer. Prescriptions: OMEPRAZOLE 40 MG CPDR (OMEPRAZOLE) one cap by mouth once daily  #90 x 1   Entered by:   Adella Hare LPN   Authorized by:   Syliva Overman MD   Signed by:   Adella Hare LPN on 31/51/7616   Method used:   Handwritten   RxID:   [id number] NAPROXEN 500 MG TABS (NAPROXEN) Take one tab two times a day as needed  #180 x 1   Entered by:    Adella Hare LPN   Authorized by:   Syliva Overman MD   Signed by:   Adella Hare LPN on 70/35/0093   Method used:   Handwritten   RxID:   [id number] FLUOXETINE HCL 20 MG CAPS (FLUOXETINE HCL) 1 pill by mouth once daily  #90 x 1   Entered by:   Adella Hare LPN   Authorized by:   Syliva Overman MD   Signed by:   Adella Hare LPN on 81/11/7508   Method used:   Handwritten   RxID:   [id number] VERAPAMIL HCL CR 240 MG TBCR (VERAPAMIL HCL) 1 by mouth once daily  #90 x 1   Entered by:   Adella Hare LPN   Authorized by:   Syliva Overman MD   Signed by:   Adella Hare LPN on 44/31/5400   Method used:   Handwritten   RxID:   [id number]  Laboratory Results   Urine Tests  Date/Time Received: January 20, 2010  Date/Time Reported: January 20, 2010   Routine Urinalysis   Color: yellow Appearance: Clear Glucose: negative   (Normal Range: Negative) Bilirubin: negative   (Normal Range: Negative) Ketone: negative   (Normal Range: Negative) Spec. Gravity: 1.025   (Normal Range: 1.003-1.035) Blood: negative   (Normal Range: Negative) pH: 6.5   (Normal Range: 5.0-8.0) Protein: trace   (Normal Range: Negative) Urobilinogen: 0.2   (Normal Range: 0-1) Nitrite: negative   (Normal Range: Negative) Leukocyte Esterace: trace   (Normal Range: Negative)

## 2010-12-15 NOTE — Letter (Signed)
Summary: Internal Other [facility]  Internal Other [facility]   Imported By: Cloria Spring LPN 56/21/3086 57:84:69  _____________________________________________________________________  External Attachment:    Type:   Image     Comment:   External Document

## 2010-12-15 NOTE — Assessment & Plan Note (Signed)
Summary: office visit   Vital Signs:  Patient profile:   70 year old female Menstrual status:  hysterectomy Height:      71 inches Weight:      251.75 pounds BMI:     35.24 O2 Sat:      94 % on 2 L/min Pulse rate:   109 / minute Pulse rhythm:   regular Resp:     16 per minute BP sitting:   112 / 60  (left arm) Cuff size:   large  Vitals Entered By: Everitt Amber LPN (April 23, 1609 10:04 AM)  Nutrition Counseling: Patient's BMI is greater than 25 and therefore counseled on weight management options.  O2 Flow:  2 L/min CC: has been getting this little spots that pop up on her and itch and then they scab over   Primary Care Provider:  Syliva Overman MD  CC:  has been getting this little spots that pop up on her and itch and then they scab over.  History of Present Illness: Pt is in prmarily for f/u of conjunctivitis and uncontrolled allergies. She reports good response to allegra with improvement. She denies erythemA, OR ITCHING OF THE EYES. Reports  that she has been doing fairly well. Denies recent fever or chills. Denies sinus pressure, nasal congestion , ear pain or sore throat. Denies chest congestion, or cough productive of sputum.she has chronic dyspnea and is oxygen dependent Denies chest pain, palpitations, PND, orthopnea or leg swelling. Denies abdominal pain, nausea, vomitting, diarrhea or constipation. Denies change in bowel movements or bloody stool. Denies dysuria , frequency, incontinence or hesitancy.  Denies headaches, vertigo, seizures.  Denies  rash, lesions, or itch.      Current Medications (verified): 1)  Fluoxetine Hcl 20 Mg Caps (Fluoxetine Hcl) .[name].. 1 Pill By Mouth Once Daily 2)  Verapamil Hcl Cr 240 Mg Tbcr (Verapamil Hcl) .[name].. 1 By Mouth Once Daily 3)  Os-Cal Ultra 600 Mg Tabs (Calcium Carb-Vit D-C-E-Mineral) .... Take One Tab Two Times A Day 4)  Brovana 15 Mcg/57ml  Nebu (Arformoterol Tartrate) .[name].. 1 in Nebulizer Two Times A Day 5)   Naproxen 500 Mg Tabs (Naproxen) .... Take One Tab Two Times A Day As Needed 6)  Mevacor 40 Mg Tabs (Lovastatin) .... 2 Tablets At Bedtime 7)  Omeprazole 40 Mg Cpdr (Omeprazole) .... One Cap By Mouth Once Daily 8)  Cerovite Senior  Tabs (Multiple Vitamins-Minerals) .... One Tab By Mouth Once Daily 9)  Oscal 500/200 D-3 500-200 Mg-Unit Tabs (Calcium-Vitamin D) .... Take 1 Tablet By Mouth Two Times A Day 10)  Fexofenadine Hcl 180 Mg Tabs (Fexofenadine Hcl) .... Take 1 Tablet By Mouth Once A Day  Allergies (verified): 1)  ! * Benazepril 2)  ! Sulfa 3)  Pravachol 4)  Cipro  Review of Systems      See HPI General:  Complains of fatigue. Eyes:  Complains of vision loss-both eyes. Resp:  Complains of shortness of breath. MS:  Complains of joint pain, low back pain, mid back pain, and stiffness. Endo:  Denies cold intolerance, excessive hunger, excessive thirst, excessive urination, heat intolerance, polyuria, and weight change. Heme:  Denies abnormal bruising and bleeding. Allergy:  Complains of seasonal allergies; improved.  Physical Exam  General:  Well-developed,well-nourished,in no acute distress; alert,appropriate and cooperative throughout examination HEENT: No facial asymmetry,  EOMI, No sinus tenderness, TM's Clear, oropharynx  pink and moist. no conjunctival erytema  Chest: Clear to auscultation bilaterally. Decreased air entry bilaterally CVS: S1,  S2, No murmurs, No S3.   Abd: Soft, Nontender.  MS: deecreased  ROM spine, hips, shoulders and knees.  Ext: No edema.   CNS: CN 2-12 intact, power tone and sensation normal throughout.   Skin: Intact, no visible lesions or rashes.  Psych: Good eye contact, normal affect.  Memory intact, not anxious or depressed appearing.    Impression & Recommendations:  Problem # 1:  ALLERGIC RHINITIS, SEASONAL (ICD-477.0) Assessment Improved  Problem # 2:  COPD (ICD-496) Assessment: Unchanged  Her updated medication list for this problem  includes:    Brovana 15 Mcg/68ml Nebu (Arformoterol tartrate) .[name]... 1 in nebulizer two times a day, pt continues on supplemental oxygen  Problem # 3:  DEGENERATIVE DISC DISEASE (ICD-722.6) Assessment: Unchanged  Problem # 4:  DEPRESSION (ICD-311) Assessment: Improved  Her updated medication list for this problem includes:    Fluoxetine Hcl 20 Mg Caps (Fluoxetine hcl) .[name]... 1 pill by mouth once daily  Problem # 5:  ANXIETY (ICD-300.00) Assessment: Improved  Her updated medication list for this problem includes:    Fluoxetine Hcl 20 Mg Caps (Fluoxetine hcl) .[name]... 1 pill by mouth once daily hospice is now a part of her husband's care   Problem # 6:  HYPERTENSION (ICD-401.9) Assessment: Improved  Her updated medication list for this problem includes:    Verapamil Hcl Cr 240 Mg Tbcr (Verapamil hcl) .[name]... 1 by mouth once daily  BP today: 112/60 Prior BP: 140/80 (04/10/2010)  Prior 10 Yr Risk Heart Disease: 13 % (09/11/2007)  Labs Reviewed: K+: 4.1 (01/23/2010) Creat: : 0.95 (01/23/2010)   Chol: 195 (01/23/2010)   HDL: 65 (01/23/2010)   LDL: 111 (01/23/2010)   TG: 95 (01/23/2010)  Complete Medication List: 1)  Fluoxetine Hcl 20 Mg Caps (Fluoxetine hcl) .[name].. 1 pill by mouth once daily 2)  Verapamil Hcl Cr 240 Mg Tbcr (Verapamil hcl) .[name].. 1 by mouth once daily 3)  Os-cal Ultra 600 Mg Tabs (Calcium carb-vit d-c-e-mineral) .... Take one tab two times a day 4)  Brovana 15 Mcg/63ml Nebu (Arformoterol tartrate) .[name].. 1 in nebulizer two times a day 5)  Naproxen 500 Mg Tabs (Naproxen) .... Take one tab two times a day as needed 6)  Mevacor 40 Mg Tabs (Lovastatin) .... 2 tablets at bedtime 7)  Omeprazole 40 Mg Cpdr (Omeprazole) .... One cap by mouth once daily 8)  Cerovite Senior Tabs (Multiple vitamins-minerals) .... One tab by mouth once daily 9)  Oscal 500/200 D-3 500-200 Mg-unit Tabs (Calcium-vitamin d) .... Take 1 tablet by mouth two times a day 10)  Fexofenadine Hcl 180 Mg Tabs  (Fexofenadine hcl) .... Take 1 tablet by mouth once a day  Patient Instructions: 1)  F/U in 2 months. 2)  Pls continue to take the allegra as prescribed. 3)  No med changes at this time . 4)  Your allergies and the eye are better

## 2010-12-15 NOTE — Assessment & Plan Note (Signed)
Summary: sick- room 2   Vital Signs:  Patient profile:   70 year old female Menstrual status:  hysterectomy Height:      71 inches Weight:      250 pounds BMI:     34.99 O2 Sat:      87 % on 2 L/min Pulse rate:   106 / minute Resp:     16 per minute BP sitting:   120 / 60  (left arm)  Vitals Entered By: Adella Hare LPN (February 03, 2010 10:24 AM) CC: nausea and generally feeling bad Comments patient reports not being able to take sulfa in the past, however, this med was rx'd this month   Primary Provider:  Syliva Overman MD  CC:  nausea and generally feeling bad.  History of Present Illness: Pt was seen 01/20/10 by Dr Lodema Hong with c/o N/V.  Pt is here today c/o N/V persisiting.   She was treated for a UTI.  Denies dysuria or freq. No abd pain. Appetite is decreased.  Since not eating well not having much BM but not constipated.  Pt comments on possible sulfa allergy.  Sx's did not change with Bactrim prescription & no rash.  Pt is very vague about her syptoms.  She did become lightheaded when she went into the bathroom. She did then admit that she has been getting lightheaded too.  No chest pain, palp or difficulty breathing.  No headaches.   Current Medications (verified): 1)  Fluoxetine Hcl 20 Mg Caps (Fluoxetine Hcl) .[name].. 1 Pill By Mouth Once Daily 2)  Verapamil Hcl Cr 240 Mg Tbcr (Verapamil Hcl) .[name].. 1 By Mouth Once Daily 3)  Fexofenadine Hcl 180 Mg Tabs (Fexofenadine Hcl) .[name].. 1 By Mouth Once Daily 4)  Os-Cal Ultra 600 Mg Tabs (Calcium Carb-Vit D-C-E-Mineral) .... Take One Tab Two Times A Day 5)  Brovana 15 Mcg/61ml  Nebu (Arformoterol Tartrate) .[name].. 1 in Nebulizer Two Times A Day 6)  Naproxen 500 Mg Tabs (Naproxen) .... Take One Tab Two Times A Day As Needed 7)  Mevacor 40 Mg Tabs (Lovastatin) .... 2 Tablets At Bedtime 8)  Omeprazole 40 Mg Cpdr (Omeprazole) .... One Cap By Mouth Once Daily 9)  Bactrim Ds 800-160 Mg Tabs (Sulfamethoxazole-Trimethoprim) .... Take 1  Tablet By Mouth Two Times A Day For 7 Days  Allergies (verified): 1)  ! * Benazepril 2)  Pravachol 3)  Cipro  Past History:  Past medical history reviewed for relevance to current acute and chronic problems.  Past Medical History: Reviewed history from 06/30/2009 and no changes required. 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 ENT:  Denies earache, nasal congestion, ringing in ears, and sinus pressure. CV:  Denies chest pain or discomfort and palpitations. Resp:  Denies cough and shortness of breath. GI:  Complains of loss of appetite, nausea, and vomiting; denies abdominal pain, constipation, and diarrhea. GU:  Denies dysuria, incontinence, and urinary frequency.  Physical Exam  General:  alert, well-developed, well-nourished, and well-hydrated.   Head:  Normocephalic and atraumatic without obvious abnormalities. No apparent alopecia or balding. Ears:  External ear exam shows no significant lesions or deformities.  Otoscopic examination reveals clear canals, tympanic membranes are intact bilaterally without bulging, retraction, inflammation or  discharge. Hearing is grossly normal bilaterally. Nose:  External nasal examination shows no deformity or inflammation. Nasal mucosa are pink and moist without lesions or exudates. Mouth:  Oral mucosa and oropharynx without lesions or exudates.  Teeth in good repair. Neck:  No deformities, masses, or tenderness noted. Lungs:  Normal respiratory effort, chest expands symmetrically. Lungs are clear to auscultation, no crackles or wheezes. Heart:   Normal rate and regular rhythm. S1 and S2 normal without gallop, murmur, click, rub or other extra sounds. Abdomen:  Bowel sounds positive,abdomen soft and non-tender without masses, organomegaly or hernias noted. Cervical Nodes:  No lymphadenopathy noted Psych:  depressed affect and flat affect.     Impression & Recommendations:  Problem # 1:  ACUTE CYSTITIS (ICD-595.0) Assessment Unchanged Pt has an increase in her leuks on dipstick today compared with her 01/20/10 OV.  The following medications were removed from the medication list:    Bactrim Ds 800-160 Mg Tabs (Sulfamethoxazole-trimethoprim) .[name]... Take 1 tablet by mouth two times a day for 7 days Her updated medication list for this problem includes:    Macrobid 100 Mg Caps (Nitrofurantoin monohyd macro) .[name]... Take 1 two times a day x 7 days  Orders: UA Dipstick W/ Micro (manual) (04540) T-Culture, Urine ([id number])  Her updated medication list for this problem includes:    Bactrim Ds 800-160 Mg Tabs (Sulfamethoxazole-trimethoprim) .[name]... Take 1 tablet by mouth two times a day for 7 days    Macrobid 100 Mg Caps (Nitrofurantoin monohyd macro) .[name]... Take 1 two times a day x 7 days  Problem # 2:  NAUSEA (ICD-787.02) Assessment: Unchanged Uncertain if due to cystitis, dizziness or other GI cause.  Her updated medication list for this problem includes:    Fexofenadine Hcl 180 Mg Tabs (Fexofenadine hcl) .[name]... 1 by mouth once daily    Antivert 25 Mg Tabs (Meclizine hcl) .[name]... Take 1 three times a day as needed for dizziness  Orders: Zofran 1mg . injection (N5621) Admin of Therapeutic Inj  intramuscular or subcutaneous (30865)  Problem # 3:  DIZZINESS (ICD-780.4) Assessment: New  Her updated medication list for this problem includes:    Fexofenadine Hcl 180 Mg Tabs (Fexofenadine hcl) .[name]... 1 by mouth once daily    Antivert 25 Mg Tabs (Meclizine hcl) .[name]... Take 1 three times a day as needed for dizziness  Complete Medication  List: 1)  Fluoxetine Hcl 20 Mg Caps (Fluoxetine hcl) .[name].. 1 pill by mouth once daily 2)  Verapamil Hcl Cr 240 Mg Tbcr (Verapamil hcl) .[name].. 1 by mouth once daily 3)  Fexofenadine Hcl 180 Mg Tabs (Fexofenadine hcl) .[name].. 1 by mouth once daily 4)  Os-cal Ultra 600 Mg Tabs (Calcium carb-vit d-c-e-mineral) .... Take one tab two times a day 5)  Brovana 15 Mcg/39ml Nebu (Arformoterol tartrate) .[name].. 1 in nebulizer two times a day 6)  Naproxen 500 Mg Tabs (Naproxen) .... Take one tab two times a day as needed 7)  Mevacor 40 Mg Tabs (Lovastatin) .... 2 tablets at bedtime 8)  Omeprazole 40 Mg Cpdr (Omeprazole) .... One cap by mouth once daily 9)  Macrobid 100 Mg Caps (Nitrofurantoin monohyd macro) .... Take 1 two times a day x 7 days 10)  Antivert 25 Mg Tabs (Meclizine hcl) .... Take 1 three times a day as needed for dizziness  Patient Instructions: 1)  Please schedule a follow-up appointment in 2 weeks with Dr Lodema Hong. 2)  I have prescribed another anitbiotic for you.  It appears that you still have  a urinary tract infection. 3)  I have also prescribed a medication to help with your dizziness. Prescriptions: OMEPRAZOLE 40 MG CPDR (OMEPRAZOLE) one cap by mouth once daily  #30 x 1   Entered and Authorized by:   Esperanza Sheets PA   Signed by:   Esperanza Sheets PA on 02/03/2010   Method used:   Electronically to        [facility]. [phone]* (retail)       [address]       [place], [place]  [address]       Ph: [phone] or [phone]       Fax: [phone]   RxID:   [id number] ANTIVERT 25 MG TABS (MECLIZINE HCL) take 1 three times a day as needed for dizziness  #30 x 0   Entered and Authorized by:   Esperanza Sheets PA   Signed by:   Esperanza Sheets PA on 02/03/2010   Method used:   Electronically to        [facility] [phone]* (retail)       [address]       [place], [place]  [address]       Ph: [phone] or [phone]       Fax: [phone]   RxID:    [id number] MACROBID 100 MG CAPS (NITROFURANTOIN MONOHYD MACRO) take 1 two times a day x 7 days  #14 x 0   Entered and Authorized by:   Esperanza Sheets PA   Signed by:   Esperanza Sheets PA on 02/03/2010   Method used:   Electronically to        [facility] [phone]* (retail)       [address]       [place], [place]  [address]       Ph: [phone] or [phone]       Fax: [phone]   RxID:   [id number]   Laboratory Results   Urine Tests  Date/Time Received: February 03, 2010  Date/Time Reported: February 03, 2010   Routine Urinalysis   Color: yellow Appearance: Clear Glucose: negative   (Normal Range: Negative) Bilirubin: negative   (Normal Range: Negative) Ketone: negative   (Normal Range: Negative) Spec. Gravity: 1.015   (Normal Range: 1.003-1.035) Blood: trace-intact   (Normal Range: Negative) pH: 5.5   (Normal Range: 5.0-8.0) Protein: negative   (Normal Range: Negative) Urobilinogen: 0.2   (Normal Range: 0-1) Nitrite: negative   (Normal Range: Negative) Leukocyte Esterace: small   (Normal Range: Negative)         Medication Administration  Injection # 1:    Medication: Zofran 1mg . injection    Diagnosis: NAUSEA (ICD-787.02)    Route: IM    Site: LUOQ gluteus    Exp Date: 2/12    Lot #: [id number]    Mfr: [email or web address]    Comments: zofran 4mg     Patient tolerated injection without complications    Given by: Adella Hare LPN (February 03, 2010 11:35 AM)  Orders Added: 1)  UA Dipstick W/ Micro (manual) [81000] 2)  Zofran 1mg . injection [J2405] 3)  Admin of Therapeutic Inj  intramuscular or subcutaneous [96372] 4)  T-Culture, Urine [[id number]] 5)  Est. Patient Level IV [27782]

## 2010-12-15 NOTE — Assessment & Plan Note (Signed)
Summary: office visit   Vital Signs:  Patient profile:   70 year old female Menstrual status:  hysterectomy Height:      71 inches Weight:      242.25 pounds BMI:     33.91 O2 Sat:      98 % on 2 L/min Pulse rate:   96 / minute Pulse rhythm:   regular Resp:     16 per minute BP sitting:   138 / 90  (right arm)  Vitals Entered By: Mauricia Area CMA  Nutrition Counseling: Patient's BMI is greater than 25 and therefore counseled on weight management options.  O2 Flow:  2 L/min CC: follow up Is Patient Diabetic? No Pain Assessment Patient in pain? no        Primary Care Provider:  Syliva Overman MD  CC:  follow up.  History of Present Illness: Reports  that she has been doing fairly well.  Denies recent fever or chills. Denies sinus pressure, nasal congestion , ear pain or sore throat. Denies chest congestion, or cough productive of sputum.She is reliant on supplemental oxygen , which she uses faithfully. Denies chest pain, palpitations, PND, orthopnea or leg swelling. Denies abdominal pain, nausea, vomitting, diarrhea or constipation. Denies change in bowel movements or bloody stool. Denies dysuria , frequency, incontinence or hesitancy. Chronic back pain with reduced mpobility, uses a walker, no falls. Denies headaches, vertigo, seizures. Chronic depression, anxiety and insomnia.she is still not using counselling svcs available to her through hospice. Denies  rash, lesions, or itch.       Current Medications (verified): 1)  Verapamil Hcl Cr 240 Mg Tbcr (Verapamil Hcl) .[name].. 1 By Mouth Once Daily 2)  Os-Cal Ultra 600 Mg Tabs (Calcium Carb-Vit D-C-E-Mineral) .... Take One Tab Two Times A Day 3)  Naproxen 500 Mg Tabs (Naproxen) .... Take One Tab Two Times A Day As Needed 4)  Omeprazole 40 Mg Cpdr (Omeprazole) .... One Cap By Mouth Once Daily 5)  Cerovite Senior  Tabs (Multiple Vitamins-Minerals) .... One Tab By Mouth Once Daily 6)  Oscal 500/200 D-3 500-200  Mg-Unit Tabs (Calcium-Vitamin D) .... Take 1 Tablet By Mouth Two Times A Day 7)  Fluoxetine Hcl 40 Mg Caps (Fluoxetine Hcl) .... Take 1 Capsule By Mouth Once A Day  Allergies (verified): 1)  ! * Benazepril 2)  ! Sulfa 3)  Pravachol 4)  Cipro  Review of Systems      See HPI General:  Complains of fatigue, sleep disorder, and weakness. Eyes:  Denies discharge, eye pain, and red eye. Resp:  Complains of pleuritic. MS:  Complains of joint pain, low back pain, mid back pain, muscle weakness, and stiffness. Psych:  Complains of anxiety, depression, and mental problems; denies suicidal thoughts/plans, thoughts of violence, and unusual visions or sounds. Endo:  Denies cold intolerance, excessive thirst, excessive urination, and heat intolerance. Heme:  Denies abnormal bruising and bleeding. Allergy:  Complains of seasonal allergies; denies hives or rash and itching eyes.  Physical Exam  General:  Well-developed,well-nourished,in no acute distress; alert,appropriate and cooperative throughout examination HEENT: No facial asymmetry,  EOMI, No sinus tenderness, TM's Clear, oropharynx  pink and moist. no conjunctival erytema  Chest: Clear to auscultation bilaterally. Decreased air entry bilaterally CVS: S1, S2, No murmurs, No S3.   Abd: Soft, Nontender.  MS: deecreased  ROM spine, hips, shoulders and knees.  Ext: No edema.   CNS: CN 2-12 intact, power tone and sensation normal throughout.   Skin: Intact multiple , no  erythema or ulceration Psych: Good eye contact, normal affect.  Memory intact, not anxious or depressed appearing.    Impression & Recommendations:  Problem # 1:  DERMATITIS (ICD-692.9) Assessment Improved  The following medications were removed from the medication list:    Benadryl 25 Mg Caps (Diphenhydramine hcl) .[name]... As needed  Problem # 2:  DEGENERATIVE DISC DISEASE (ICD-722.6) Assessment: Unchanged  Problem # 3:  HYPERTENSION (ICD-401.9) Assessment:  Unchanged  Her updated medication list for this problem includes:    Verapamil Hcl Cr 240 Mg Tbcr (Verapamil hcl) .[name]... 1 by mouth once daily  BP today: 138/90 Prior BP: 140/86 (07/03/2010) needs improvement, pt aware Prior 10 Yr Risk Heart Disease: 13 % (09/11/2007)  Labs Reviewed: K+: 4.0 (07/14/2010) Creat: : 0.98 (07/14/2010)   Chol: 222 (07/14/2010)   HDL: 67 (07/14/2010)   LDL: 139 (07/14/2010)   TG: 79 (07/14/2010)  Problem # 4:  COPD (ICD-496) Assessment: Unchanged  The following medications were removed from the medication list:    Brovana 15 Mcg/42ml Nebu (Arformoterol tartrate) .[name]... 1 in nebulizer two times a day  Problem # 5:  HYPERLIPIDEMIA (ICD-272.4) Assessment: Unchanged  The following medications were removed from the medication list:    Mevacor 40 Mg Tabs (Lovastatin) .[name]... 2 tablets at bedtime  Orders: T-Lipid Profile [phone]) T-Hepatic Function [phone])  Labs Reviewed: SGOT: 17 (07/14/2010)   SGPT: 12 (07/14/2010)  Prior 10 Yr Risk Heart Disease: 13 % (09/11/2007)   HDL:67 (07/14/2010), 65 (01/23/2010)  LDL:139 (07/14/2010), 111 (01/23/2010)  Chol:222 (07/14/2010), 195 (01/23/2010)  Trig:79 (07/14/2010), 95 (01/23/2010)  Complete Medication List: 1)  Verapamil Hcl Cr 240 Mg Tbcr (Verapamil hcl) .[name].. 1 by mouth once daily 2)  Os-cal Ultra 600 Mg Tabs (Calcium carb-vit d-c-e-mineral) .... Take one tab two times a day 3)  Naproxen 500 Mg Tabs (Naproxen) .... Take one tab two times a day as needed 4)  Omeprazole 40 Mg Cpdr (Omeprazole) .... One cap by mouth once daily 5)  Cerovite Senior Tabs (Multiple vitamins-minerals) .... One tab by mouth once daily 6)  Oscal 500/200 D-3 500-200 Mg-unit Tabs (Calcium-vitamin d) .... Take 1 tablet by mouth two times a day 7)  Fluoxetine Hcl 40 Mg Caps (Fluoxetine hcl) .... Take 1 capsule by mouth once a day  Other Orders: Pneumococcal Vaccine (18841) Admin 1st Vaccine (66063) Tdap => 31yrs IM (01601) Admin  of Any Addtl Vaccine (09323) Influenza Vaccine MCR (55732)  Patient Instructions: 1)  Please schedule a follow-up appointment in 3 months. 2)  Call if you need help before. 3)    4)  Pls seek counselling and support through hospice for yourself as you care for your ill spouse 5)  Flu vac today. 6)  The medication list was reviewed and reconciled..All changed/newly prescribed medications were explained. A complete medication list was provided to the patient/caregiver.  7)  PLS remember to take all meds as prescribed, esp the cholesterol med. 8)  Hepatic Panel prior to visit, ICD-9: 9)  Lipid Panel prior to visit, ICD-9:   Immunizations Administered:  Pneumonia Vaccine:    Vaccine Type: Pneumovax    Site: right deltoid    Mfr: Merck    Dose: 0.5 ml    Route: IM    Given by: Adella Hare LPN    Exp. Date: 02/01/2012    Lot #: [id number]    VIS given: 10/20/09 version given August 20, 2010.  Tetanus Vaccine:    Vaccine Type: Tdap    Site: left  deltoid    Mfr: GlaxoSmithKline    Dose: 0.5 ml    Route: IM    Given by: Adella Hare LPN    Exp. Date: 09/03/2012    Lot #: [id number]    VIS given: 10/02/08 version given August 20, 2010.  Influenza Vaccine # 1:    Vaccine Type: Fluvax MCR    Site: right deltoid    Mfr: novartis    Dose: 0.5 ml    Route: IM    Given by: Adella Hare LPN    Exp. Date: 03/2011    Lot #: [id number]    VIS given: 06/09/10 version given August 20, 2010.

## 2010-12-15 NOTE — Letter (Signed)
Summary: CONFIRMATION OF ORDER Patsy Lager  CONFIRMATION OF ORDER Patsy Lager   Imported By: Lind Guest 06/03/2010 08:56:34  _____________________________________________________________________  External Attachment:    Type:   Image     Comment:   External Document

## 2010-12-15 NOTE — Assessment & Plan Note (Signed)
Summary: office visit per doc   Vital Signs:  Patient profile:   70 year old female Menstrual status:  hysterectomy Height:      71 inches Weight:      254.50 pounds BMI:     35.62 O2 Sat:      95 % on 2 L/min Pulse rate:   101 / minute Pulse rhythm:   regular Resp:     16 per minute BP sitting:   140 / 88  (left arm) Cuff size:   large  Vitals Entered By: Everitt Amber LPN (Mar 19, 8100 9:36 AM)  Nutrition Counseling: Patient's BMI is greater than 25 and therefore counseled on weight management options.  O2 Flow:  2 L/min CC: Follow up visit per Dr. Lodema Hong. Husband is very ill and she's under alot of stress   Primary Care Provider:  Syliva Overman MD  CC:  Follow up visit per Dr. Lodema Hong. Husband is very ill and she's under alot of stress.  History of Present Illness: Reports  that she has been doing fairly well. She is currently under increased stress since her spouse of over 45 yrs is now under the care of hospice due to cancer. Denies recent fever or chills. Denies sinus pressure, nasal congestion , ear pain or sore throat. Denies chest congestion, or cough productive of sputum.She has COPD and is oxygen dependent. Denies chest pain, palpitations, PND, orthopnea or leg swelling. Denies abdominal pain, nausea, vomitting, diarrhea or constipation. Denies change in bowel movements or bloody stool. Denies dysuria , frequency, incontinence or hesitancy. reports reduced mobility of spine, hips and knee, and chronic pain and swelling of the major joints.. Denies headaches, vertigo, seizures. Denies depression, anxiety or insomnia. Denies  rash, lesions, or itch.  She is also here to review recent tests which ere alll good    1   Current Medications (verified): 1)  Fluoxetine Hcl 20 Mg Caps (Fluoxetine Hcl) .[name].. 1 Pill By Mouth Once Daily 2)  Verapamil Hcl Cr 240 Mg Tbcr (Verapamil Hcl) .[name].. 1 By Mouth Once Daily 3)  Os-Cal Ultra 600 Mg Tabs (Calcium Carb-Vit  D-C-E-Mineral) .... Take One Tab Two Times A Day 4)  Brovana 15 Mcg/59ml  Nebu (Arformoterol Tartrate) .[name].. 1 in Nebulizer Two Times A Day 5)  Naproxen 500 Mg Tabs (Naproxen) .... Take One Tab Two Times A Day As Needed 6)  Mevacor 40 Mg Tabs (Lovastatin) .... 2 Tablets At Bedtime 7)  Omeprazole 40 Mg Cpdr (Omeprazole) .... One Cap By Mouth Once Daily  Allergies (verified): 1)  ! * Benazepril 2)  ! Sulfa 3)  Pravachol 4)  Cipro  Review of Systems      See HPI Eyes:  Denies discharge, eye pain, itching, and red eye. Heme:  Denies abnormal bruising and bleeding. Allergy:  Denies hives or rash and itching eyes.  Physical Exam  General:  alert, well-developed, well-nourished, and well-hydrated.   HEENT: No facial asymmetry, carotid bruit EOMI, No sinus tenderness, TM's Clear, oropharynx  pink and moist.   Chest: Clear to auscultation bilaterally. Decreased air entry throughout CVS: S1, S2, No murmurs, No S3.   Abd: Soft, Nontender.  BP:ZWCHENIDP  ROM spine, hips, shoulders and knees. Ambulates with a walker Ext: No edema.   CNS: CN 2-12 intact, power tone and sensation normal throughout.   Skin: Intact, no visible lesions or rashes.  Psych: Good eye contact, normal affect.  Memory intact,mildly  anxious and depressed appearing.    Impression &  Recommendations:  Problem # 1:  CAROTID BRUIT (ICD-785.9) Assessment Comment Only no obstruction rdiologically, pt reassured  Problem # 2:  COPD (ICD-496) Assessment: Unchanged  Her updated medication list for this problem includes:    Brovana 15 Mcg/5ml Nebu (Arformoterol tartrate) .[name]... 1 in nebulizer two times a day remains oxygen dependent  Problem # 3:  ANXIETY (ICD-300.00) Assessment: Deteriorated  Her updated medication list for this problem includes:    Fluoxetine Hcl 20 Mg Caps (Fluoxetine hcl) .[name]... 1 pill by mouth once daily  Problem # 4:  DEPRESSION (ICD-311) Assessment: Deteriorated  Her updated medication list for  this problem includes:    Fluoxetine Hcl 20 Mg Caps (Fluoxetine hcl) .[name]... 1 pill by mouth once daily  Problem # 5:  ALLERGIC RHINITIS (ICD-477.9)  Complete Medication List: 1)  Fluoxetine Hcl 20 Mg Caps (Fluoxetine hcl) .[name].. 1 pill by mouth once daily 2)  Verapamil Hcl Cr 240 Mg Tbcr (Verapamil hcl) .[name].. 1 by mouth once daily 3)  Os-cal Ultra 600 Mg Tabs (Calcium carb-vit d-c-e-mineral) .... Take one tab two times a day 4)  Brovana 15 Mcg/39ml Nebu (Arformoterol tartrate) .[name].. 1 in nebulizer two times a day 5)  Naproxen 500 Mg Tabs (Naproxen) .... Take one tab two times a day as needed 6)  Mevacor 40 Mg Tabs (Lovastatin) .... 2 tablets at bedtime 7)  Omeprazole 40 Mg Cpdr (Omeprazole) .... One cap by mouth once daily  Other Orders: T-Basic Metabolic Panel [phone]) T-Hepatic Function [phone]) T-Lipid Profile [phone]) T-Vitamin D (25-Hydroxy) [phone])  Patient Instructions: 1)  Please schedule a follow-up appointment in 4 months. 2)  BMP prior to visit, ICD-9: 3)  Hepatic Panel prior to visit, ICD-9: 4)  Lipid Panel prior to visit, ICD-9:   fasting in 4 months 5)  vitamin d level 6)  pls do breathing treatments twice daily, and call to reschedule the colonscopy. 7)  you are in my thoughts with theill health of your husband. Prescriptions: NAPROXEN 500 MG TABS (NAPROXEN) Take one tab two times a day as needed  #180 x 1   Entered by:   Everitt Amber LPN   Authorized by:   Syliva Overman MD   Signed by:   Everitt Amber LPN on 28/41/3244   Method used:   Faxed to ...       [facility] (mail-order)             , [place]         Ph: [phone]       Fax: [phone]   RxID:   [id number] MEVACOR 40 MG TABS (LOVASTATIN) 2 tablets at bedtime  #180 x 3   Entered by:   Everitt Amber LPN   Authorized by:   Syliva Overman MD   Signed by:   Everitt Amber LPN on 41/66/0630   Method used:   Faxed to ...       [facility] (mail-order)             , [place]           Ph: [phone]       Fax: [phone]   RxID:   [id number] OMEPRAZOLE 40 MG CPDR (OMEPRAZOLE) one cap by mouth once daily  #90 x 3   Entered by:   Everitt Amber LPN   Authorized by:   Syliva Overman MD   Signed by:   Everitt Amber LPN on 60/73/7106   Method used:   Faxed to .Marland KitchenMarland Kitchen  [facility] (mail-order)             , [place]         Ph: [phone]       Fax: [phone]   RxID:   [id number] FLUOXETINE HCL 20 MG CAPS (FLUOXETINE HCL) 1 pill by mouth once daily  #90 x 3   Entered by:   Everitt Amber LPN   Authorized by:   Syliva Overman MD   Signed by:   Everitt Amber LPN on 18/84/1660   Method used:   Faxed to ...       [facility] (mail-order)             , [place]         Ph: [phone]       Fax: [phone]   RxID:   [id number] VERAPAMIL HCL CR 240 MG TBCR (VERAPAMIL HCL) 1 by mouth once daily  #90 x 3   Entered by:   Everitt Amber LPN   Authorized by:   Syliva Overman MD   Signed by:   Everitt Amber LPN on 76/16/0737   Method used:   Faxed to ...       [facility] (mail-order)             , [place]         Ph: [phone]       Fax: [phone]   RxID:   [id number]

## 2010-12-15 NOTE — Progress Notes (Signed)
Summary: [facility]  Phone Note Call from Patient   Summary of Call: NEEDS HER FLUOXETINE  OMEPRAZOLE AND VERAPAMIL    SEND TO [facility] Initial call taken by: Lind Guest,  September 02, 2010 9:58 AM  Follow-up for Phone Call        pls refill as requested Follow-up by: Syliva Overman MD,  September 02, 2010 12:23 PM    Prescriptions: FLUOXETINE HCL 40 MG CAPS (FLUOXETINE HCL) Take 1 capsule by mouth once a day  #90 x 0   Entered by:   Adella Hare LPN   Authorized by:   Syliva Overman MD   Signed by:   Adella Hare LPN on 16/08/9603   Method used:   Faxed to ...       [facility] (mail-order)             , [place]         Ph: [phone]       Fax: [phone]   RxID:   [id number] OMEPRAZOLE 40 MG CPDR (OMEPRAZOLE) one cap by mouth once daily  #90 x 0   Entered by:   Adella Hare LPN   Authorized by:   Syliva Overman MD   Signed by:   Adella Hare LPN on 32/44/0102   Method used:   Faxed to ...       [facility] (mail-order)             , [place]         Ph: [phone]       Fax: [phone]   RxID:   [id number] VERAPAMIL HCL CR 240 MG TBCR (VERAPAMIL HCL) 1 by mouth once daily  #90 x 0   Entered by:   Adella Hare LPN   Authorized by:   Syliva Overman MD   Signed by:   Adella Hare LPN on 05/14/1600   Method used:   Faxed to ...       [facility] (mail-order)             , [place]         Ph: [phone]       Fax: [phone]   RxID:   [id number] FLUOXETINE HCL 40 MG CAPS (FLUOXETINE HCL) Take 1 capsule by mouth once a day  #90 x 0   Entered by:   Adella Hare LPN   Authorized by:   Syliva Overman MD   Signed by:   Adella Hare LPN on 71/04/2693   Method used:   Electronically to        [facility] [phone]* (retail)       [address]       [place], [place]  [address]       Ph: [phone] or [phone]       Fax: [phone]   RxID:   [id number] OMEPRAZOLE 40 MG CPDR (OMEPRAZOLE) one cap by  mouth once daily  #90 x 0   Entered by:   Adella Hare LPN   Authorized by:   Syliva Overman MD   Signed by:   Adella Hare LPN on 31/54/0086   Method used:   Electronically to        [facility] [phone]* (retail)       [address]       [place], [place]  [address]       Ph: [phone] or [phone]  Fax: [phone]   RxID:   [id number] VERAPAMIL HCL CR 240 MG TBCR (VERAPAMIL HCL) 1 by mouth once daily  #90 x 0   Entered by:   Adella Hare LPN   Authorized by:   Syliva Overman MD   Signed by:   Adella Hare LPN on 95/63/8756   Method used:   Electronically to        [facility] [phone]* (retail)       [address]       [place], [place]  [address]       Ph: [phone] or [phone]       Fax: [phone]   RxID:   [id number]

## 2010-12-15 NOTE — Assessment & Plan Note (Signed)
Summary: rash /  eye swollen - room 2   Vital Signs:  Patient profile:   70 year old female Menstrual status:  hysterectomy Height:      71 inches Weight:      254.75 pounds BMI:     35.66 O2 Sat:      98 % on Room air Pulse rate:   89 / minute Resp:     16 per minute BP sitting:   140 / 80  (left arm)  Vitals Entered By: Adella Hare LPN (Apr 10, 2010 8:40 AM) CC: rash and swollen eye Is Patient Diabetic? No Pain Assessment Patient in pain? no        Primary Provider:  Syliva Overman MD  CC:  rash and swollen eye.  History of Present Illness: Pt presents today with an itchy rash. She states that she had 2 red bumps on her lower legs about 1 week ago.  They formed 1 single blister on each, then healed up after broke open.  Now she awoke with itching and swelling of her Rt upper eyelid. She admits to seasonal allergies.   Not currently taking allergy meds. Allegra has worked well for her in the past. No new foods, meds, soaps, lotions etc. She denies difficulty breathing, difficulty swallowing, swelling of her tongue or throat.   Current Medications (verified): 1)  Fluoxetine Hcl 20 Mg Caps (Fluoxetine Hcl) .[name].. 1 Pill By Mouth Once Daily 2)  Verapamil Hcl Cr 240 Mg Tbcr (Verapamil Hcl) .[name].. 1 By Mouth Once Daily 3)  Os-Cal Ultra 600 Mg Tabs (Calcium Carb-Vit D-C-E-Mineral) .... Take One Tab Two Times A Day 4)  Brovana 15 Mcg/68ml  Nebu (Arformoterol Tartrate) .[name].. 1 in Nebulizer Two Times A Day 5)  Naproxen 500 Mg Tabs (Naproxen) .... Take One Tab Two Times A Day As Needed 6)  Mevacor 40 Mg Tabs (Lovastatin) .... 2 Tablets At Bedtime 7)  Omeprazole 40 Mg Cpdr (Omeprazole) .... One Cap By Mouth Once Daily 8)  Cerovite Senior  Tabs (Multiple Vitamins-Minerals) .... One Tab By Mouth Once Daily  Allergies (verified): 1)  ! * Benazepril 2)  ! Sulfa 3)  Pravachol 4)  Cipro  Past History:  Past medical history reviewed for relevance to current acute and chronic  problems.  Past Medical History: Reviewed history from 06/30/2009 and no changes required. 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 CV:  Denies palpitations. Resp:  Denies cough and shortness of breath. Derm:  Complains of itching, lesion(s), and rash. Allergy:  Complains of hives or rash, itching eyes, seasonal allergies, and sneezing.  Physical Exam  General:  Well-developed,well-nourished,in no acute distress; alert,appropriate and cooperative throughout examination Head:  Normocephalic and atraumatic without obvious abnormalities. No apparent alopecia or balding. Eyes:  Mild swelling and erythema of Rt upper lid noted.  pupils equal, pupils round, pupils reactive to light, and no injection.   Ears:  External ear exam shows no significant lesions or deformities.  Otoscopic examination reveals clear canals, tympanic membranes are intact bilaterally without bulging, retraction, inflammation or discharge. Hearing is grossly normal bilaterally. Nose:  External nasal examination shows no deformity or inflammation. Nasal mucosa are pale and moistt without lesions or exudates. Mouth:  Oral  mucosa and oropharynx without lesions or exudates.  Teeth in good repair. Neck:  No deformities, masses, or tenderness noted. Lungs:  Normal respiratory effort, chest expands symmetrically. Lungs are clear to auscultation, no crackles or wheezes. Heart:  Normal rate and regular rhythm. S1 and S2 normal without gallop, murmur, click, rub or other extra sounds. Skin:  irrg erythmatous itchy raised lesion Rt jawline, and  Rt upper lid Cervical Nodes:  No lymphadenopathy noted Psych:  Cognition and judgment appear intact. Alert and cooperative with normal attention span and concentration. No apparent delusions, illusions, hallucinations   Impression & Recommendations:  Problem # 1:  URTICARIA (ICD-708.9) Assessment New  Orders: Depo- Medrol 80mg  (J1040) Admin of Therapeutic Inj  intramuscular or subcutaneous (81191)  Problem # 2:  ALLERGIC RHINITIS, SEASONAL (ICD-477.0) Assessment: Deteriorated  Problem # 3:  HYPERTENSION (ICD-401.9) Assessment: Comment Only  Her updated medication list for this problem includes:    Verapamil Hcl Cr 240 Mg Tbcr (Verapamil hcl) .[name]... 1 by mouth once daily  BP today: 140/80 Prior BP: 140/88 (03/19/2010)  Prior 10 Yr Risk Heart Disease: 13 % (09/11/2007)  Labs Reviewed: K+: 4.1 (01/23/2010) Creat: : 0.95 (01/23/2010)   Chol: 195 (01/23/2010)   HDL: 65 (01/23/2010)   LDL: 111 (01/23/2010)   TG: 95 (01/23/2010)  Complete Medication List: 1)  Fluoxetine Hcl 20 Mg Caps (Fluoxetine hcl) .[name].. 1 pill by mouth once daily 2)  Verapamil Hcl Cr 240 Mg Tbcr (Verapamil hcl) .[name].. 1 by mouth once daily 3)  Os-cal Ultra 600 Mg Tabs (Calcium carb-vit d-c-e-mineral) .... Take one tab two times a day 4)  Brovana 15 Mcg/30ml Nebu (Arformoterol tartrate) .[name].. 1 in nebulizer two times a day 5)  Naproxen 500 Mg Tabs (Naproxen) .... Take one tab two times a day as needed 6)  Mevacor 40 Mg Tabs (Lovastatin) .... 2 tablets at bedtime 7)  Omeprazole 40 Mg Cpdr (Omeprazole) .... One cap by mouth once daily 8)  Cerovite Senior Tabs (Multiple vitamins-minerals) .... One tab by mouth once daily  Patient Instructions: 1)  Please schedule a follow-up appointment as needed. 2)  Restart Allegra one daily. 3)  You have received a shot of Depo Medrol today to help with your allergies and hives.   Medication Administration  Injection # 1:    Medication: Depo- Medrol 80mg     Diagnosis:  URTICARIA (ICD-708.9)    Route: IM    Site: L deltoid    Exp Date: 3/12    Lot #: [id number]    Mfr: Pharmacia    Patient tolerated injection without complications    Given by: Adella Hare LPN (Apr 10, 2010 9:27 AM)  Orders Added: 1)  Depo- Medrol 80mg  [J1040] 2)  Admin of Therapeutic Inj  intramuscular or subcutaneous [96372] 3)  Est. Patient Level IV [47829]

## 2010-12-15 NOTE — Letter (Signed)
Summary: [name]   Imported By: Lind Guest 11/28/2009 09:19:29  _____________________________________________________________________  External Attachment:    Type:   Image     Comment:   External Document

## 2010-12-15 NOTE — Assessment & Plan Note (Signed)
Summary: office visit   Vital Signs:  Patient profile:   70 year old female Menstrual status:  hysterectomy Height:      71 inches Weight:      257 pounds BMI:     35.97 O2 Sat:      95 % on 2 L/min Pulse rate:   112 / minute Pulse rhythm:   regular Resp:     18 per minute BP sitting:   150 / 90  (left arm) Cuff size:   large  Vitals Entered By: Everitt Amber LPN (February 17, 1609 9:28 AM)  Nutrition Counseling: Patient's BMI is greater than 25 and therefore counseled on weight management options.  O2 Flow:  2 L/min CC: Follow up chronic problems   Primary Care Provider:  Syliva Overman MD  CC:  Follow up chronic problems.  History of Present Illness: Pt in for f/u,. states she has nausea and vertigo, states the antivert is helping her alot. She denies any fever or chills. She denies urinary symptoms currently. She reprts nausea for approx 2 months, has had only a hysterectomy. States her last colonscopy was over 10 years ago. Reports  she will try lovastatin 1 only when she saw her lipid results, and states she will quit if it bothers her and let us kniow. reports lightheadedness when she stands quickly.   Current Medications (verified): 1)  Fluoxetine Hcl 20 Mg Caps (Fluoxetine Hcl) .[name].. 1 Pill By Mouth Once Daily 2)  Verapamil Hcl Cr 240 Mg Tbcr (Verapamil Hcl) .[name].. 1 By Mouth Once Daily 3)  Os-Cal Ultra 600 Mg Tabs (Calcium Carb-Vit D-C-E-Mineral) .... Take One Tab Two Times A Day 4)  Brovana 15 Mcg/63ml  Nebu (Arformoterol Tartrate) .[name].. 1 in Nebulizer Two Times A Day 5)  Naproxen 500 Mg Tabs (Naproxen) .... Take One Tab Two Times A Day As Needed 6)  Mevacor 40 Mg Tabs (Lovastatin) .... 2 Tablets At Bedtime 7)  Omeprazole 40 Mg Cpdr (Omeprazole) .... One Cap By Mouth Once Daily  Allergies (verified): 1)  ! * Benazepril 2)  ! Sulfa 3)  Pravachol 4)  Cipro  Family History: Reviewed history from 10/20/2009 and no changes required. father-deceased-CA,  stomach mother-deceased-breast CA sisters x 2  living, 1 deceasedb at age 68 cancer brothers x 3 healthy  Social History: Reviewed history from 10/20/2009 and no changes required. Married Former Smoker-1/2ppd x40years, quit October 2008 Alcohol use-no Drug use-no 3 daughtersand one son, oneis hypertensive emploed in the tobacco industry  Review of Systems General:  Complains of fatigue, malaise, and weakness; denies chills and fever. Eyes:  Denies discharge and red eye. ENT:  Complains of nasal congestion and postnasal drainage; denies sinus pressure and sore throat. CV:  Complains of difficulty breathing at night and shortness of breath with exertion; denies chest pain or discomfort, palpitations, swelling of feet, and weight gain. Resp:  Complains of shortness of breath; denies cough, sputum productive, and wheezing; oxygen dpendent. GI:  Denies abdominal pain, constipation, diarrhea, indigestion, nausea, and vomiting. GU:  Denies dysuria and urinary frequency. MS:  Complains of joint pain, low back pain, mid back pain, muscle weakness, and stiffness. Derm:  Denies itching, lesion(s), and rash. Neuro:  Complains of poor balance and weakness; denies seizures and tingling. Psych:  Complains of anxiety and depression; denies easily tearful, irritability, mental problems, suicidal thoughts/plans, thoughts of violence, and unusual visions or sounds. Endo:  Denies cold intolerance, excessive hunger, excessive thirst, excessive urination, heat intolerance, polyuria, and weight  change. Heme:  Denies abnormal bruising and bleeding. Allergy:  Complains of seasonal allergies.  Physical Exam  General:  alert, well-developed, well-nourished, and well-hydrated.   HEENT: No facial asymmetry, carotid bruit EOMI, No sinus tenderness, TM's Clear, oropharynx  pink and moist.   Chest: Clear to auscultation bilaterally. Decreased air entry throughout CVS: S1, S2, No murmurs, No S3.   Abd: Soft,  Nontender.  UJ:[id number]  ROM spine, hips, shoulders and knees. Ambulates with a walker Ext: No edema.   CNS: CN 2-12 intact, power tone and sensation normal throughout.   Skin: Intact, no visible lesions or rashes.  Psych: Good eye contact, normal affect.  Memory intact,mildly  anxious and depressed appearing.    Impression & Recommendations:  Problem # 1:  CAROTID BRUIT (ICD-785.9) Assessment Comment Only  Orders: Radiology Referral (Radiology)  Problem # 2:  NAUSEA (ICD-787.02) Assessment: Deteriorated  Orders: Radiology Referral (Radiology), RUQ ordered  Problem # 3:  DIZZINESS (ICD-780.4) Assessment: Improved  The following medications were removed from the medication list:    Fexofenadine Hcl 180 Mg Tabs (Fexofenadine hcl) .[name]... 1 by mouth once daily  Problem # 4:  HYPERTENSION (ICD-401.9) Assessment: Deteriorated  Her updated medication list for this problem includes:    Verapamil Hcl Cr 240 Mg Tbcr (Verapamil hcl) .[name]... 1 by mouth once daily  BP today: 150/90 Prior BP: 120/60 (02/03/2010)  Prior 10 Yr Risk Heart Disease: 13 % (09/11/2007)  Labs Reviewed: K+: 4.1 (01/23/2010) Creat: : 0.95 (01/23/2010)   Chol: 195 (01/23/2010)   HDL: 65 (01/23/2010)   LDL: 111 (01/23/2010)   TG: 95 (01/23/2010)  Problem # 5:  ANXIETY (ICD-300.00) Assessment: Unchanged  Her updated medication list for this problem includes:    Fluoxetine Hcl 20 Mg Caps (Fluoxetine hcl) .[name]... 1 pill by mouth once daily  Complete Medication List: 1)  Fluoxetine Hcl 20 Mg Caps (Fluoxetine hcl) .[name].. 1 pill by mouth once daily 2)  Verapamil Hcl Cr 240 Mg Tbcr (Verapamil hcl) .[name].. 1 by mouth once daily 3)  Os-cal Ultra 600 Mg Tabs (Calcium carb-vit d-c-e-mineral) .... Take one tab two times a day 4)  Brovana 15 Mcg/41ml Nebu (Arformoterol tartrate) .[name].. 1 in nebulizer two times a day 5)  Naproxen 500 Mg Tabs (Naproxen) .... Take one tab two times a day as needed 6)  Mevacor 40 Mg Tabs  (Lovastatin) .... 2 tablets at bedtime 7)  Omeprazole 40 Mg Cpdr (Omeprazole) .... One cap by mouth once daily  Other Orders: Gastroenterology Referral (GI)  Patient Instructions: 1)  Please schedule a follow-up appointment in 1 month. 2)  You will be referred fopr a colonscopy, gall blladder tests, , and a carotid doppler, 3)  Plas start lovastatin 1 at night. Prescriptions: VERAPAMIL HCL CR 240 MG TBCR (VERAPAMIL HCL) 1 by mouth once daily  #90 x 3   Entered by:   Everitt Amber LPN   Authorized by:   Syliva Overman MD   Signed by:   Everitt Amber LPN on 95/62/1308   Method used:   Faxed to ...       [facility] (mail-order)       [address]       [place], [place]  [id number]       Ph: [phone]       Fax: [phone]   RxID:   [id number] OMEPRAZOLE 40 MG CPDR (OMEPRAZOLE) one cap by mouth once daily  #90 x 3   Entered by:   Everitt Amber  LPN   Authorized by:   Syliva Overman MD   Signed by:   Everitt Amber LPN on 62/13/0865   Method used:   Faxed to ...       [facility] (mail-order)       [address]       [place], [place]  [id number]       Ph: [phone]       Fax: [phone]   RxID:   [id number] FLUOXETINE HCL 20 MG CAPS (FLUOXETINE HCL) 1 pill by mouth once daily  #90 x 1   Entered by:   Everitt Amber LPN   Authorized by:   Syliva Overman MD   Signed by:   Everitt Amber LPN on 33/29/5188   Method used:   Faxed to ...       [facility] (mail-order)       [address]       [place], [place]  [id number]       Ph: [phone]       Fax: [phone]   RxID:   [id number]

## 2010-12-15 NOTE — Assessment & Plan Note (Signed)
Summary: office visit   Vital Signs:  Patient profile:   70 year old female Menstrual status:  hysterectomy Height:      71 inches Weight:      248 pounds BMI:     34.71 O2 Sat:      94 % on 2 L/min Pulse rate:   73 / minute Pulse rhythm:   regular Resp:     17 per minute BP sitting:   130 / 70  (left arm) Cuff size:   large  Vitals Entered By: Everitt Amber LPN (June 23, 2010 9:34 AM)  Nutrition Counseling: Patient's BMI is greater than 25 and therefore counseled on weight management options.  O2 Flow:  2 L/min CC: Follow up chronic problems   Primary Care Provider:  Syliva Overman MD  CC:  Follow up chronic problems.  History of Present Illness: Reports  that she has been doing fairly well. Denies recent fever or chills. Denies sinus pressure, nasal congestion , ear pain or sore throat. Denies chest congestion, or cough productive of sputum.She is dyspnoeic even at rest, and needs supplemental oxygen at all tmes. Denies chest pain, palpitations, PND, orthopnea or leg swelling. Denies abdominal pain, nausea, vomitting, diarrhea or constipation. Denies change in bowel movements or bloody stool. Denies dysuria , frequency, incontinence or hesitancy. chromnic and unchanged   joint pain, swelling,and  reduced mobility.Ambulates with a walker. Denies headaches, vertigo, seizures. Denies depression, anxiety or insomnia being uncontrolled, she is on medication for these Denies  rash, lesions, or itch.      Current Medications (verified): 1)  Fluoxetine Hcl 20 Mg Caps (Fluoxetine Hcl) .[name].. 1 Pill By Mouth Once Daily 2)  Verapamil Hcl Cr 240 Mg Tbcr (Verapamil Hcl) .[name].. 1 By Mouth Once Daily 3)  Os-Cal Ultra 600 Mg Tabs (Calcium Carb-Vit D-C-E-Mineral) .... Take One Tab Two Times A Day 4)  Brovana 15 Mcg/2ml  Nebu (Arformoterol Tartrate) .[name].. 1 in Nebulizer Two Times A Day 5)  Naproxen 500 Mg Tabs (Naproxen) .... Take One Tab Two Times A Day As Needed 6)  Mevacor 40 Mg  Tabs (Lovastatin) .... 2 Tablets At Bedtime 7)  Omeprazole 40 Mg Cpdr (Omeprazole) .... One Cap By Mouth Once Daily 8)  Cerovite Senior  Tabs (Multiple Vitamins-Minerals) .... One Tab By Mouth Once Daily 9)  Oscal 500/200 D-3 500-200 Mg-Unit Tabs (Calcium-Vitamin D) .... Take 1 Tablet By Mouth Two Times A Day 10)  Fexofenadine Hcl 180 Mg Tabs (Fexofenadine Hcl) .... Take 1 Tablet By Mouth Once A Day  Allergies (verified): 1)  ! * Benazepril 2)  ! Sulfa 3)  Pravachol 4)  Cipro  Review of Systems      See HPI General:  Complains of fatigue and sleep disorder. Eyes:  Complains of vision loss-both eyes; denies discharge and eye pain. Resp:  Complains of shortness of breath. MS:  Complains of joint pain, low back pain, mid back pain, muscle weakness, and stiffness. Neuro:  Complains of visual disturbances and weakness; denies seizures and sensation of room spinning. Psych:  Complains of anxiety and depression; denies panic attacks, suicidal thoughts/plans, thoughts of violence, and thoughts /plans of harming others; states she has been doubling the 20mg  fluoxetine and finds this useful. Endo:  Denies cold intolerance, excessive thirst, excessive urination, and heat intolerance. Heme:  Denies abnormal bruising and bleeding. Allergy:  Complains of seasonal allergies.  Physical Exam  General:  Well-developed,well-nourished,in no acute distress; alert,appropriate and cooperative throughout examination HEENT: No facial asymmetry,  EOMI, No sinus tenderness, TM's Clear, oropharynx  pink and moist. no conjunctival erytema  Chest: Clear to auscultation bilaterally. Decreased air entry bilaterally CVS: S1, S2, No murmurs, No S3.   Abd: Soft, Nontender.  MS: deecreased  ROM spine, hips, shoulders and knees.  Ext: No edema.   CNS: CN 2-12 intact, power tone and sensation normal throughout.   Skin: Intact, no visible lesions or rashes.  Psych: Good eye contact, normal affect.  Memory intact, not  anxious or depressed appearing.    Impression & Recommendations:  Problem # 1:  COPD (ICD-496) Assessment Unchanged  Her updated medication list for this problem includes:    Brovana 15 Mcg/56ml Nebu (Arformoterol tartrate) .[name]... 1 in nebulizer two times a day  Problem # 2:  HYPERTENSION (ICD-401.9) Assessment: Unchanged  Her updated medication list for this problem includes:    Verapamil Hcl Cr 240 Mg Tbcr (Verapamil hcl) .[name]... 1 by mouth once daily  Orders: T-Basic Metabolic Panel [id number])  BP today: 130/70 Prior BP: 112/60 (04/23/2010)  Prior 10 Yr Risk Heart Disease: 13 % (09/11/2007)  Labs Reviewed: K+: 4.1 (01/23/2010) Creat: : 0.95 (01/23/2010)   Chol: 195 (01/23/2010)   HDL: 65 (01/23/2010)   LDL: 111 (01/23/2010)   TG: 95 (01/23/2010)  Problem # 3:  DEPRESSION (ICD-311) Assessment: Improved  The following medications were removed from the medication list:    Fluoxetine Hcl 20 Mg Caps (Fluoxetine hcl) .[name]... 1 pill by mouth once daily Her updated medication list for this problem includes:    Fluoxetine Hcl 40 Mg Caps (Fluoxetine hcl) .[name]... Take 1 capsule by mouth once a day  Problem # 4:  HYPERLIPIDEMIA (ICD-272.4) Assessment: Comment Only  Her updated medication list for this problem includes:    Mevacor 40 Mg Tabs (Lovastatin) .[name]... 2 tablets at bedtime  Orders: T-Hepatic Function [id number]) T-Lipid Profile [id number])  Labs Reviewed: SGOT: 20 (01/23/2010)   SGPT: 15 (01/23/2010)  Prior 10 Yr Risk Heart Disease: 13 % (09/11/2007)   HDL:65 (01/23/2010), 60 (08/01/2009)  LDL:111 (01/23/2010), 172 (08/01/2009)  Chol:195 (01/23/2010), 250 (08/01/2009)  Trig:95 (01/23/2010), 90 (08/01/2009)  Complete Medication List: 1)  Verapamil Hcl Cr 240 Mg Tbcr (Verapamil hcl) .[name].. 1 by mouth once daily 2)  Os-cal Ultra 600 Mg Tabs (Calcium carb-vit d-c-e-mineral) .... Take one tab two times a day 3)  Brovana 15 Mcg/43ml Nebu (Arformoterol tartrate) .[name].. 1 in  nebulizer two times a day 4)  Naproxen 500 Mg Tabs (Naproxen) .... Take one tab two times a day as needed 5)  Mevacor 40 Mg Tabs (Lovastatin) .... 2 tablets at bedtime 6)  Omeprazole 40 Mg Cpdr (Omeprazole) .... One cap by mouth once daily 7)  Cerovite Senior Tabs (Multiple vitamins-minerals) .... One tab by mouth once daily 8)  Oscal 500/200 D-3 500-200 Mg-unit Tabs (Calcium-vitamin d) .... Take 1 tablet by mouth two times a day 9)  Fexofenadine Hcl 180 Mg Tabs (Fexofenadine hcl) .... Take 1 tablet by mouth once a day 10)  Fluoxetine Hcl 40 Mg Caps (Fluoxetine hcl) .... Take 1 capsule by mouth once a day  Other Orders: T-Vitamin D (25-Hydroxy) [id number])  Patient Instructions: 1)  f/u in mid october 2)  BMP prior to visit, ICD-9: 3)  Hepatic Panel prior to visit, ICD-9:   fasting mid October 4)  Lipid Panel prior to visit, ICD-9: 5)  vitd 6)  dose increase in fluoxetine take two 20mg  capsules daily till done, then new med is 40mg  oNE daily Prescriptions: FLUOXETINE HCL  40 MG CAPS (FLUOXETINE HCL) Take 1 capsule by mouth once a day  #90 x 3   Entered by:   Everitt Amber LPN   Authorized by:   Syliva Overman MD   Signed by:   Everitt Amber LPN on 95/28/4132   Method used:   Printed then faxed to ...       [address] [phone]* (retail)       [address]       [place], [place]  [address]       Ph: [phone] or [phone]       Fax: [phone]   RxID:   [id number] FLUOXETINE HCL 40 MG CAPS (FLUOXETINE HCL) Take 1 capsule by mouth once a day  #30 x 3   Entered and Authorized by:   Syliva Overman MD   Signed by:   Syliva Overman MD on 06/23/2010   Method used:   Printed then faxed to ...       [address] [phone]* (retail)       [address]       [place], [place]  [address]       Ph: [phone] or [phone]       Fax: [phone]   RxID:   [id number]

## 2010-12-15 NOTE — Letter (Signed)
Summary: ORDER WALKER  ORDER WALKER   Imported By: Lind Guest 12/09/2009 10:50:07  _____________________________________________________________________  External Attachment:    Type:   Image     Comment:   External Document

## 2010-12-15 NOTE — Letter (Signed)
Summary: medical necessity  medical necessity   Imported By: Lind Guest 04/24/2010 16:19:52  _____________________________________________________________________  External Attachment:    Type:   Image     Comment:   External Document

## 2010-12-15 NOTE — Assessment & Plan Note (Signed)
Summary: office visit   Vital Signs:  Patient profile:   70 year old female Menstrual status:  hysterectomy Height:      71 inches Weight:      248.75 pounds BMI:     34.82 O2 Sat:      96 % on 2 L/min Pulse rate:   101 / minute Pulse rhythm:   regular Resp:     18 per minute BP sitting:   140 / 86  (left arm) Cuff size:   large  Vitals Entered By: Everitt Amber LPN (July 03, 2010 9:26 AM)  Nutrition Counseling: Patient's BMI is greater than 25 and therefore counseled on weight management options.  O2 Flow:  2 L/min CC: has these itchy bumps that have been popping up all over her body and she has been taking banadryl but they are not doing away   Primary Care Provider:  Syliva Overman MD  CC:  has these itchy bumps that have been popping up all over her body and she has been taking banadryl but they are not doing away.  History of Present Illness: 1 week h/o puritic rash involving lower ext and back, red, has watery blisters, no fever or chills. Pt had similar rash a few weeks ago, no other familyt member or clos contact is similarly affected. he denies fever or chills, she sometimes has a purulent drainage associated with the lesions. She ahs ujsed OTC benadryl with no relief and the itch is very disturbing.  Current Medications (verified): 1)  Verapamil Hcl Cr 240 Mg Tbcr (Verapamil Hcl) .[name].. 1 By Mouth Once Daily 2)  Os-Cal Ultra 600 Mg Tabs (Calcium Carb-Vit D-C-E-Mineral) .... Take One Tab Two Times A Day 3)  Brovana 15 Mcg/32ml  Nebu (Arformoterol Tartrate) .[name].. 1 in Nebulizer Two Times A Day 4)  Naproxen 500 Mg Tabs (Naproxen) .... Take One Tab Two Times A Day As Needed 5)  Mevacor 40 Mg Tabs (Lovastatin) .... 2 Tablets At Bedtime 6)  Omeprazole 40 Mg Cpdr (Omeprazole) .... One Cap By Mouth Once Daily 7)  Cerovite Senior  Tabs (Multiple Vitamins-Minerals) .... One Tab By Mouth Once Daily 8)  Oscal 500/200 D-3 500-200 Mg-Unit Tabs (Calcium-Vitamin D) .... Take 1  Tablet By Mouth Two Times A Day 9)  Benadryl 25 Mg Caps (Diphenhydramine Hcl) .... As Needed 10)  Fluoxetine Hcl 40 Mg Caps (Fluoxetine Hcl) .... Take 1 Capsule By Mouth Once A Day  Allergies (verified): 1)  ! * Benazepril 2)  ! Sulfa 3)  Pravachol 4)  Cipro  Review of Systems      See HPI General:  Complains of fatigue. Eyes:  Complains of vision loss-both eyes; denies blurring and discharge. ENT:  Denies hoarseness, nasal congestion, sinus pressure, and sore throat. CV:  Denies chest pain or discomfort, palpitations, and swelling of feet. Resp:  Denies cough and sputum productive. GI:  Denies abdominal pain, constipation, nausea, and vomiting. MS:  Complains of joint pain and stiffness. Derm:  Complains of itching and rash. Psych:  Complains of anxiety and depression; denies mental problems, panic attacks, suicidal thoughts/plans, thoughts of violence, and thoughts /plans of harming others.  Physical Exam  General:  Well-developed,well-nourished,in no acute distress; alert,appropriate and cooperative throughout examination HEENT: No facial asymmetry,  EOMI, No sinus tenderness, TM's Clear, oropharynx  pink and moist. no conjunctival erytema  Chest: Clear to auscultation bilaterally. Decreased air entry bilaterally CVS: S1, S2, No murmurs, No S3.   Abd: Soft, Nontender.  MS: deecreased  ROM spine, hips, shoulders and knees.  Ext: No edema.   CNS: CN 2-12 intact, power tone and sensation normal throughout.   Skin: Intact multiple scartach marks on back and lowe extremeties, erythematous vesicular lesions scattered throughout.No evidence of bacteril superinfection Psych: Good eye contact, normal affect.  Memory intact, not anxious or depressed appearing.    Impression & Recommendations:  Problem # 1:  DERMATITIS (ICD-692.9) Assessment Comment Only  Her updated medication list for this problem includes:    Benadryl 25 Mg Caps (Diphenhydramine hcl) .[name]... As needed     Prednisone (pak) 5 Mg Tabs (Prednisone) ..... Use as directed  Orders: Depo- Medrol 80mg  (J1040) Benadryl  IM or IV (J1200) Admin of Therapeutic Inj  intramuscular or subcutaneous (14782)  Problem # 2:  COPD (ICD-496) Assessment: Unchanged  Her updated medication list for this problem includes:    Brovana 15 Mcg/79ml Nebu (Arformoterol tartrate) .[name]... 1 in nebulizer two times a day  Problem # 3:  HYPERTENSION (ICD-401.9) Assessment: Deteriorated  Her updated medication list for this problem includes:    Verapamil Hcl Cr 240 Mg Tbcr (Verapamil hcl) .[name]... 1 by mouth once daily  BP today: 140/86 Prior BP: 130/70 (06/23/2010)  Prior 10 Yr Risk Heart Disease: 13 % (09/11/2007)  Labs Reviewed: K+: 4.1 (01/23/2010) Creat: : 0.95 (01/23/2010)   Chol: 195 (01/23/2010)   HDL: 65 (01/23/2010)   LDL: 111 (01/23/2010)   TG: 95 (01/23/2010)  Problem # 4:  DEGENERATIVE DISC DISEASE (ICD-722.6) Assessment: Unchanged  Complete Medication List: 1)  Verapamil Hcl Cr 240 Mg Tbcr (Verapamil hcl) .[name].. 1 by mouth once daily 2)  Os-cal Ultra 600 Mg Tabs (Calcium carb-vit d-c-e-mineral) .... Take one tab two times a day 3)  Brovana 15 Mcg/56ml Nebu (Arformoterol tartrate) .[name].. 1 in nebulizer two times a day 4)  Naproxen 500 Mg Tabs (Naproxen) .... Take one tab two times a day as needed 5)  Mevacor 40 Mg Tabs (Lovastatin) .... 2 tablets at bedtime 6)  Omeprazole 40 Mg Cpdr (Omeprazole) .... One cap by mouth once daily 7)  Cerovite Senior Tabs (Multiple vitamins-minerals) .... One tab by mouth once daily 8)  Oscal 500/200 D-3 500-200 Mg-unit Tabs (Calcium-vitamin d) .... Take 1 tablet by mouth two times a day 9)  Benadryl 25 Mg Caps (Diphenhydramine hcl) .... As needed 10)  Fluoxetine Hcl 40 Mg Caps (Fluoxetine hcl) .... Take 1 capsule by mouth once a day 11)  Prednisone (pak) 5 Mg Tabs (Prednisone) .... Use as directed 12)  Cephalexin 500 Mg Caps (Cephalexin) .... Take 1 capsule by mouth two times a  day  Patient Instructions: 1)  F/U as before. 2)  You will get 2 shots in the office , benadryl and depomedrol. 3)  Meds are also sent in to the pharmacy, pls take them all 4)  You need to use AVEENO bath soap and CALAMINE LOTION. 5)  pls use the equate allergy pills one 3 times daily for the next 3 to 5 days Prescriptions: CEPHALEXIN 500 MG CAPS (CEPHALEXIN) Take 1 capsule by mouth two times a day  #14 x 0   Entered and Authorized by:   Syliva Overman MD   Signed by:   Syliva Overman MD on 07/03/2010   Method used:   Electronically to        [facility] [phone]* (retail)       [address]       [place],  [place]  [address]       Ph: [phone] or [phone]       Fax: [phone]   RxID:   [id number] PREDNISONE (PAK) 5 MG TABS (PREDNISONE) Use as directed  #21 x 0   Entered and Authorized by:   Syliva Overman MD   Signed by:   Syliva Overman MD on 07/03/2010   Method used:   Electronically to        [facility] [phone]* (retail)       [address]       [place], [place]  [address]       Ph: [phone] or [phone]       Fax: [phone]   RxID:   [id number]    Medication Administration  Injection # 1:    Medication: Depo- Medrol 80mg     Diagnosis: DERMATITIS (ICD-692.9)    Route: IM    Site: RUOQ gluteus    Exp Date: 03/2011    Lot #: [id number]    Mfr: Pharmacia    Comments: 80mg  given     Patient tolerated injection without complications    Given by: Everitt Amber LPN (July 03, 2010 11:16 AM)  Injection # 2:    Medication: Benadryl  IM or IV    Diagnosis: DERMATITIS (ICD-692.9)    Route: IM    Site: LUOQ gluteus    Exp Date: 11/2011    Lot #: [id number]    Mfr: [email or web address]    Comments: 25mg  given     Patient tolerated injection without complications    Given by: Everitt Amber LPN (July 03, 2010 11:16 AM)  Orders Added: 1)  Est. Patient Level IV [70623] 2)  Depo- Medrol 80mg  [J1040] 3)  Benadryl  IM or IV  [J1200] 4)  Admin of Therapeutic Inj  intramuscular or subcutaneous [76283]

## 2010-12-17 NOTE — Letter (Signed)
Summary: oximetry results  oximetry results   Imported By: Lind Guest 11/25/2010 11:08:12  _____________________________________________________________________  External Attachment:    Type:   Image     Comment:   External Document

## 2010-12-17 NOTE — Progress Notes (Signed)
Summary: [facility]  Phone Note Other Incoming   Caller: Jasmine December from [place] Summary of Call: [phone] opt 2 and you may speak with whomever answer,  needs a verbal on oxygen and cpap supplies.   Initial call taken by: Curtis Sites,  November 03, 2010 3:50 PM  Follow-up for Phone Call        HAVE A FORM WILL SEND IN Follow-up by: Adella Hare LPN,  November 03, 2010 6:27 PM

## 2010-12-17 NOTE — Letter (Signed)
Summary: [facility]   Imported By: Lind Guest 11/17/2010 15:22:45  _____________________________________________________________________  External Attachment:    Type:   Image     Comment:   External Document

## 2010-12-17 NOTE — Letter (Signed)
[facility]   Imported By: Lind Guest 11/02/2010 13:16:59  _____________________________________________________________________  External Attachment:    Type:   Image     Comment:   External Document

## 2010-12-17 NOTE — Letter (Signed)
Summary: [facility]   Imported By: Lind Guest 11/25/2010 09:14:42  _____________________________________________________________________  External Attachment:    Type:   Image     Comment:   External Document

## 2010-12-17 NOTE — Assessment & Plan Note (Signed)
Summary: OV   Vital Signs:  Patient profile:   70 year old female Menstrual status:  hysterectomy Height:      71 inches Weight:      243.75 pounds BMI:     34.12 O2 Sat:      91 % on 2 L/min Pulse rate:   96 / minute Pulse rhythm:   regular Resp:     16 per minute BP sitting:   142 / 70  (left arm)  Vitals Entered By: Adella Hare LPN (October 30, 2010 8:41 AM)  O2 Flow:  2 L/min CC: follow-up visit Is Patient Diabetic? No   Primary Care Provider:  Syliva Overman MD  CC:  follow-up visit.  History of Present Illness: Needs oxygen from [facility], drop [facility] effective Jan 2012. Reports  that she has been doing fairly well. She lost her terminally ill spouse approx 2 months ago, and is at pweace with this. Her children are supportive. Denies recent fever or chills. Denies sinus pressure, nasal congestion , ear pain or sore throat. Denies chest congestion, or cough productive of sputum. Denies chest pain, palpitations, PND, orthopnea or leg swelling. Denies abdominal pain, nausea, vomitting, diarrhea or constipation. Denies change in bowel movements or bloody stool.   Denies headaches, vertigo, seizures. Denies depression, anxiety or insomnia. Denies  rash, lesions, or itch.     Current Medications (verified): 1)  Verapamil Hcl Cr 240 Mg Tbcr (Verapamil Hcl) .[name].. 1 By Mouth Once Daily 2)  Os-Cal Ultra 600 Mg Tabs (Calcium Carb-Vit D-C-E-Mineral) .... Take One Tab Two Times A Day 3)  Naproxen 500 Mg Tabs (Naproxen) .... Take One Tab Two Times A Day As Needed 4)  Omeprazole 40 Mg Cpdr (Omeprazole) .... One Cap By Mouth Once Daily 5)  Cerovite Senior  Tabs (Multiple Vitamins-Minerals) .... One Tab By Mouth Once Daily 6)  Oscal 500/200 D-3 500-200 Mg-Unit Tabs (Calcium-Vitamin D) .... Take 1 Tablet By Mouth Two Times A Day 7)  Fluoxetine Hcl 40 Mg Caps (Fluoxetine Hcl) .... Take 1 Capsule By Mouth Once A Day  Allergies (verified): 1)  ! * Benazepril 2)  ! Sulfa 3)   Pravachol 4)  Cipro  Past History:  Family history reviewed for relevance to current acute and chronic problems.  Family History: Reviewed history from 10/20/2009 and no changes required. father-deceased-CA, stomach mother-deceased-breast CA sisters x 2  living, 1 deceasedb at age 46 cancer brothers x 3 healthy  Social History: Widow 08/2010 Former Smoker-1/2ppd x40years, quit October 2008 Alcohol use-no Drug use-no 3 daughtersand one son, oneis hypertensive emploed in the tobacco industry  Review of Systems      See HPI General:  Complains of fatigue. Eyes:  Complains of vision loss-both eyes; denies discharge, eye pain, and red eye; wears corerective lenses. GI:  Complains of abdominal pain; increased reflux symptoms will prop head and stop sodas. GU:  Complains of urinary frequency; malodoros urine x 1 day. MS:  Complains of joint pain, muscle weakness, and stiffness; left hip pain and stiffness x 1 month , fell this morning.Uses assistive device chronically . Endo:  Denies cold intolerance, excessive hunger, excessive thirst, and excessive urination. Heme:  Denies abnormal bruising and bleeding. Allergy:  Denies hives or rash and itching eyes.  Physical Exam  General:  Well-developed,well-nourished,in no acute distress; alert,appropriate and cooperative throughout examination HEENT: No facial asymmetry,  EOMI, No sinus tenderness, TM's Clear, oropharynx  pink and moist.   Chest:decreased air entry scattered crackles , no wheezes  CVS: S1, S2, No murmurs, No S3.   Abd: Soft, Nontender.  MS: decreased  ROM spine, hips, shoulders and knees.  Ext: No edema.   CNS: CN 2-12 intact, power tone and sensation normal throughout.   Skin: Intact, no visible lesions or rashes.  Psych: Good eye contact, normal affect.  Memory intact, not anxious or depressed appearing.    Impression & Recommendations:  Problem # 1:  HIP PAIN, LEFT (ICD-719.45) Assessment Deteriorated  Her  updated medication list for this problem includes:    Naproxen 500 Mg Tabs (Naproxen) .[name]... Take one tab two times a day as needed  Orders: Depo- Medrol 80mg  (J1040) Ketorolac-Toradol 15mg  (E4540) Admin of Therapeutic Inj  intramuscular or subcutaneous (98119)  Problem # 2:  ALLERGIC RHINITIS, SEASONAL (ICD-477.0) Assessment: Deteriorated  Problem # 3:  ACUTE CYSTITIS (ICD-595.0) Assessment: Comment Only  Her updated medication list for this problem includes:    Ciprofloxacin Hcl 500 Mg Tabs (Ciprofloxacin hcl) ..... One tab by mouth two times a day  Orders: Urinalysis (14782-95621) T-Culture, Urine ([id number])  Encouraged to push clear liquids, get enough rest, and take acetaminophen as needed. To be seen in 10 days if no improvement, sooner if worse.  Problem # 4:  HYPERTENSION (ICD-401.9) Assessment: Unchanged  Her updated medication list for this problem includes:    Verapamil Hcl Cr 240 Mg Tbcr (Verapamil hcl) .[name]... 1 by mouth once daily Patient advised to follow low sodium diet rich in fruit and vegetables, and to commit to at least 30 minutes 5 days per week of regular exercise , to improve blood presure control.   BP today: 142/70 Prior BP: 138/90 (08/20/2010)  Prior 10 Yr Risk Heart Disease: 13 % (09/11/2007)  Labs Reviewed: K+: 4.0 (07/14/2010) Creat: : 0.98 (07/14/2010)   Chol: 222 (07/14/2010)   HDL: 67 (07/14/2010)   LDL: 139 (07/14/2010)   TG: 79 (07/14/2010)  Problem # 5:  DEGENERATIVE DISC DISEASE (ICD-722.6) Assessment: Deteriorated counselled re improving safety in the home and reducing falls   Problem # 6:  COPD (ICD-496) Assessment: Unchanged new oxygen svc per insurance coverage  Problem # 7:  HYPERLIPIDEMIA (ICD-272.4) Assessment: Comment Only  The following medications were removed from the medication list:    Lovastatin 40 Mg Tabs (Lovastatin) .[name]... Take 1 tab by mouth at bedtime Her updated medication list for this problem includes:     Lovastatin 40 Mg Tabs (Lovastatin) .[name]... Take 1 tab by mouth at bedtime  Orders: Medicare Electronic Prescription [phone]) T-Lipid Profile [phone]) T-Hepatic Function [phone])  Labs Reviewed: SGOT: 17 (07/14/2010)   SGPT: 12 (07/14/2010)  Prior 10 Yr Risk Heart Disease: 13 % (09/11/2007)   HDL:67 (07/14/2010), 65 (01/23/2010)  LDL:139 (07/14/2010), 111 (01/23/2010)  Chol:222 (07/14/2010), 195 (01/23/2010)  Trig:79 (07/14/2010), 95 (01/23/2010) Low fat dietdiscussed and encouraged  Complete Medication List: 1)  Verapamil Hcl Cr 240 Mg Tbcr (Verapamil hcl) .[name].. 1 by mouth once daily 2)  Os-cal Ultra 600 Mg Tabs (Calcium carb-vit d-c-e-mineral) .... Take one tab two times a day 3)  Naproxen 500 Mg Tabs (Naproxen) .... Take one tab two times a day as needed 4)  Omeprazole 40 Mg Cpdr (Omeprazole) .... One cap by mouth once daily 5)  Cerovite Senior Tabs (Multiple vitamins-minerals) .... One tab by mouth once daily 6)  Oscal 500/200 D-3 500-200 Mg-unit Tabs (Calcium-vitamin d) .... Take 1 tablet by mouth two times a day 7)  Fluoxetine Hcl 40 Mg Caps (Fluoxetine hcl) .... Take 1 capsule by mouth  once a day 8)  Lovastatin 40 Mg Tabs (Lovastatin) .... Take 1 tab by mouth at bedtime 9)  Ciprofloxacin Hcl 500 Mg Tabs (Ciprofloxacin hcl) .... One tab by mouth two times a day  Other Orders: TLB-H. Pylori Abs(Helicobacter Pylori) (86677-HELICO)  Patient Instructions: 1)  Please schedule a follow-up appointment in 4 months. 2)  Hepatic Panel prior to visit, ICD-9:   fasting in 4 months 3)  Lipid Panel prior to visit, ICD-9: 4)  H pylori  dyspepsia 5)  New med for cholesterol, pls follow low fat diet 6)  Urine will be checked today.[name] 7)  We woill also notify your oxygen service 8)  [facility] Greet[facility]!! Prescriptions: CIPROFLOXACIN HCL 500 MG TABS (CIPROFLOXACIN HCL) one tab by mouth two times a day  #10 x 0   Entered by:   Adella Hare LPN   Authorized by:   Syliva Overman MD    Signed by:   Adella Hare LPN on 91/47/8295   Method used:   Electronically to        [facility] [phone]* (retail)       [address]       [place], [place]  [address]       Ph: [phone] or [phone]       Fax: [phone]   RxID:   [id number] LOVASTATIN 40 MG TABS (LOVASTATIN) Take 1 tab by mouth at bedtime  #30 x 3   Entered and Authorized by:   Syliva Overman MD   Signed by:   Syliva Overman MD on 10/30/2010   Method used:   Electronically to        [facility] [phone]* (retail)       [address]       [place], [place]  [address]       Ph: [phone] or [phone]       Fax: [phone]   RxID:   [id number] LOVASTATIN 40 MG TABS (LOVASTATIN) Take 1 tab by mouth at bedtime  #90 x 1   Entered and Authorized by:   Syliva Overman MD   Signed by:   Syliva Overman MD on 10/30/2010   Method used:   Printed then faxed to ...       [address] [phone]* (retail)       [address]       [place], [place]  [address]       Ph: [phone] or [phone]       Fax: [phone]   RxID:   [id number] LOVASTATIN 40 MG TABS (LOVASTATIN) Take 1 tab by mouth at bedtime  #30 x 4   Entered and Authorized by:   Syliva Overman MD   Signed by:   Syliva Overman MD on 10/30/2010   Method used:   Electronically to        [facility] [phone]* (retail)       [address]       [place], [place]  [address]       Ph: [phone] or [phone]       Fax: [phone]   RxID:   [id number]    Medication Administration  Injection # 1:    Medication: Depo- Medrol 80mg     Diagnosis: HIP PAIN, LEFT (ICD-719.45)    Route: IM    Site: RUOQ  gluteus    Exp Date: 07/12    Lot #: Gunnar Bulla    Mfr: Pharmacia    Patient tolerated injection without complications    Given by: Adella Hare LPN (October 30, 2010 3:21 PM)  Injection # 2:    Medication: Ketorolac-Toradol 15mg     Diagnosis:  HIP PAIN, LEFT (ICD-719.45)    Route: IM    Site: LUOQ gluteus    Exp Date: 04/15/2012    Lot #: [id number]    Mfr: novalog    Comments: toradol 60mg  given    Patient tolerated injection without complications    Given by: Adella Hare LPN (October 30, 2010 3:22 PM)  Orders Added: 1)  Est. Patient Level IV [91478] 2)  Medicare Electronic Prescription [G8553] 3)  T-Lipid Profile [80061-22930] 4)  T-Hepatic Function [80076-22960] 5)  TLB-H. Pylori Abs(Helicobacter Pylori) [86677-HELICO] 6)  Depo- Medrol 80mg  [J1040] 7)  Ketorolac-Toradol 15mg  [J1885] 8)  Admin of Therapeutic Inj  intramuscular or subcutaneous [96372] 9)  Urinalysis [81003-65000] 10)  T-Culture, Urine [[id number]]     Medication Administration  Injection # 1:    Medication: Depo- Medrol 80mg     Diagnosis: HIP PAIN, LEFT (ICD-719.45)    Route: IM    Site: RUOQ gluteus    Exp Date: 07/12    Lot #: Gunnar Bulla    Mfr: Pharmacia    Patient tolerated injection without complications    Given by: Adella Hare LPN (October 30, 2010 3:21 PM)  Injection # 2:    Medication: Ketorolac-Toradol 15mg     Diagnosis: HIP PAIN, LEFT (ICD-719.45)    Route: IM    Site: LUOQ gluteus    Exp Date: 04/15/2012    Lot #: [id number]    Mfr: novalog    Comments: toradol 60mg  given    Patient tolerated injection without complications    Given by: Adella Hare LPN (October 30, 2010 3:22 PM)  Orders Added: 1)  Est. Patient Level IV [29528] 2)  Medicare Electronic Prescription [G8553] 3)  T-Lipid Profile [80061-22930] 4)  T-Hepatic Function [80076-22960] 5)  TLB-H. Pylori Abs(Helicobacter Pylori) [86677-HELICO] 6)  Depo- Medrol 80mg  [J1040] 7)  Ketorolac-Toradol 15mg  [J1885] 8)  Admin of Therapeutic Inj  intramuscular or subcutaneous [96372] 9)  Urinalysis [81003-65000] 10)  T-Culture, Urine [[id number]]   Laboratory Results   Urine Tests  Date/Time Received: October 30, 2010 3:29 PM  Date/Time Reported: October 30, 2010  3:30 PM   Routine Urinalysis   Color: yellow Appearance: Clear Glucose: negative   (Normal Range: Negative) Bilirubin: negative   (Normal Range: Negative) Ketone: trace (5)   (Normal Range: Negative) Spec. Gravity: 1.025   (Normal Range: 1.003-1.035) Blood: negative   (Normal Range: Negative) pH: 6.0   (Normal Range: 5.0-8.0) Protein: trace   (Normal Range: Negative) Urobilinogen: 1.0   (Normal Range: 0-1) Nitrite: positive   (Normal Range: Negative) Leukocyte Esterace: trace   (Normal Range: Negative)

## 2010-12-17 NOTE — Letter (Signed)
[facility]   Imported By: Lind Guest 11/04/2010 13:03:25  _____________________________________________________________________  External Attachment:    Type:   Image     Comment:   External Document

## 2010-12-17 NOTE — Progress Notes (Signed)
Summary: medication  Phone Note Call from Patient   Summary of Call: omeprazole 40 mg, fluoxetine hcl 40 mg, and her naproxen 500 mg she needs these called into [facility].   Initial call taken by: Curtis Sites,  November 13, 2010 10:51 AM    Prescriptions: FLUOXETINE HCL 40 MG CAPS (FLUOXETINE HCL) Take 1 capsule by mouth once a day  #90 x 0   Entered by:   Adella Hare LPN   Authorized by:   Syliva Overman MD   Signed by:   Adella Hare LPN on 16/08/9603   Method used:   Faxed to ...       [facility] (mail-order)             , [place]         Ph: [phone]       Fax: [phone]   RxID:   [id number] OMEPRAZOLE 40 MG CPDR (OMEPRAZOLE) one cap by mouth once daily  #90 x 0   Entered by:   Adella Hare LPN   Authorized by:   Syliva Overman MD   Signed by:   Adella Hare LPN on 32/44/0102   Method used:   Faxed to ...       [facility] (mail-order)             , [place]         Ph: [phone]       Fax: [phone]   RxID:   [id number] NAPROXEN 500 MG TABS (NAPROXEN) Take one tab two times a day as needed  #180 x 0   Entered by:   Adella Hare LPN   Authorized by:   Syliva Overman MD   Signed by:   Adella Hare LPN on 05/14/1600   Method used:   Faxed to ...       [facility] (mail-order)             , [place]         Ph: [phone]       Fax: [phone]   RxID:   [id number]

## 2010-12-18 NOTE — Letter (Signed)
Summary: Letter  Letter   Imported By: Lind Guest 02/25/2010 16:39:43  _____________________________________________________________________  External Attachment:    Type:   Image     Comment:   External Document

## 2011-01-08 ENCOUNTER — Encounter: Payer: Self-pay | Admitting: Family Medicine

## 2011-01-12 NOTE — Letter (Signed)
Summary: to reschedule   to reschedule   Imported By: Lind Guest 01/08/2011 14:14:18  _____________________________________________________________________  External Attachment:    Type:   Image     Comment:   External Document

## 2011-01-14 ENCOUNTER — Other Ambulatory Visit: Payer: Self-pay | Admitting: Family Medicine

## 2011-01-14 DIAGNOSIS — Z139 Encounter for screening, unspecified: Secondary | ICD-10-CM

## 2011-02-02 ENCOUNTER — Telehealth: Payer: Self-pay | Admitting: Family Medicine

## 2011-02-02 ENCOUNTER — Ambulatory Visit (HOSPITAL_COMMUNITY)
Admission: RE | Admit: 2011-02-02 | Discharge: 2011-02-02 | Disposition: A | Discharge status: Home / Self Care | Payer: Medicare PPO | Source: Ambulatory Visit | Attending: Family Medicine | Admitting: Family Medicine

## 2011-02-02 DIAGNOSIS — Z1231 Encounter for screening mammogram for malignant neoplasm of breast: Secondary | ICD-10-CM | POA: Insufficient documentation

## 2011-02-02 DIAGNOSIS — Z139 Encounter for screening, unspecified: Secondary | ICD-10-CM

## 2011-02-02 NOTE — Telephone Encounter (Signed)
Briefly examined 2 areas of concern per pt, no tick seen, no erythema. Warmth, tenderness or drainage. Advised topical neosporin if become infected, not to scratch area, use of pure vaseline for comfort only

## 2011-02-18 ENCOUNTER — Encounter: Payer: Self-pay | Admitting: Family Medicine

## 2011-02-22 ENCOUNTER — Other Ambulatory Visit: Payer: Self-pay | Admitting: Family Medicine

## 2011-02-22 DIAGNOSIS — M25552 Pain in left hip: Secondary | ICD-10-CM

## 2011-02-22 DIAGNOSIS — K219 Gastro-esophageal reflux disease without esophagitis: Secondary | ICD-10-CM

## 2011-02-22 DIAGNOSIS — I10 Essential (primary) hypertension: Secondary | ICD-10-CM

## 2011-02-22 DIAGNOSIS — F32A Depression, unspecified: Secondary | ICD-10-CM

## 2011-02-22 DIAGNOSIS — F329 Major depressive disorder, single episode, unspecified: Secondary | ICD-10-CM

## 2011-02-23 ENCOUNTER — Ambulatory Visit: Payer: Self-pay | Admitting: Family Medicine

## 2011-03-01 ENCOUNTER — Ambulatory Visit: Payer: Self-pay | Admitting: Family Medicine

## 2011-03-09 ENCOUNTER — Telehealth: Payer: Self-pay | Admitting: Family Medicine

## 2011-03-09 MED ORDER — OMEPRAZOLE 40 MG PO CPDR: 40.0000 mg | DELAYED_RELEASE_CAPSULE | Freq: Every day | ORAL | Status: DC

## 2011-03-09 MED ORDER — NAPROXEN 500 MG PO TABS: 500.0000 mg | ORAL_TABLET | Freq: Two times a day (BID) | ORAL | Status: DC

## 2011-03-09 MED ORDER — FLUOXETINE HCL 40 MG PO CAPS: 40.0000 mg | ORAL_CAPSULE | Freq: Every day | ORAL | Status: DC

## 2011-03-09 MED ORDER — VERAPAMIL HCL ER 240 MG PO TBCR: 240.0000 mg | EXTENDED_RELEASE_TABLET | Freq: Every day | ORAL | Status: DC

## 2011-03-30 NOTE — H&P (Signed)
[name], [name]           ACCOUNT NO.:  [id number]   MEDICAL RECORD NO.:  [id number]          PATIENT TYPE:  INP   LOCATION:  A338                          FACILITY:  [facility]   PHYSICIAN:  Skeet Latch, DO    DATE OF BIRTH:  24-Jan-1941   DATE OF ADMISSION:  08/22/2007  DATE OF DISCHARGE:  LH                              HISTORY & PHYSICAL   PRIMARY CARE PHYSICIAN:  Dr. [name].   CHIEF COMPLAINT:  Shortness of breath.   HISTORY OF PRESENT ILLNESS:  This is a 70 year old African American  female who presents to the emergency room with hypoxemia.  Apparently,  the patient was seen at her primary care physician's office today and  was found to be hypoxic and was given nebulizer treatments and Solu-  Medrol without relief.  The patient states for the past one week she has  had progressive shortness of breath, especially on exertion, as well as  leg weakness.  The patient denies any fever, chills, nausea, vomiting or  diarrhea.  The patient has been taking over-the-counter medications  without relief.  The patient also complains of some whitish sputum when  she coughs.  The patient has no history of any respiratory illnesses,  but the patient is a one-pack per day smoker for over 40 years.   PAST MEDICAL HISTORY:  1. Allergic rhinitis.  2. Anemia.  3. Anxiety/depression.  4  Gastroesophageal reflux disease.  1. Hypertension.  2. Low back pain.  3. Arthritis.  4. Obstructive sleep apnea on CPAP.  5. C spine disk disease.  6. L spine disk disease.  7. Onychomycosis.   PAST SURGICAL HISTORY:  Hysterectomy oophorectomy.   SOCIAL HISTORY:  The patient is a one-pack per day smoker for over 40  years.  Denies any drug use or alcohol abuse.  Past family history is  positive for cancer.  Mother had Guillain-Barre disease, positive for  diabetes and stroke.   DRUG ALLERGIES:  BENAZEPRIL AND PRAVACHOL.   CURRENT MEDICATIONS:  1. Diovan 320 mg daily.  2. Prozac 20 mg daily.  3. Verapamil 240 mg twice a day.  4. Omeprazole 20 mg twice a day.  5. Aspirin 81 mg daily.  6. Calcium twice a day.  7. Naproxen 500 mg as needed.  8. Singulair 10 mg daily.  9. Allegra 180 mg daily.   REVIEW OF SYSTEMS:  CONSTITUTION:  The patient denies any chills, fever  or weight loss.  HEENT: Unremarkable.  CARDIOVASCULAR:  Unremarkable.  RESPIRATORY:  Positive for productive cough and dyspnea.  GENITOURINARY:  Unremarkable GASTROINTESTINAL:  Unremarkable.  NEUROLOGICALLY:  Unremarkable.  Other systems are unremarkable.   PHYSICAL EXAMINATION:  GENERAL: The patient is alert, awake, well-  hydrated, well-nourished, well-developed female, looks appropriate age.  No acute distress.  VITAL SIGNS: Temperature is 98.8, pulse 87, respirations 20, blood  pressure 161/75.  The patient was sating 94% on 5 liters.  HEENT:  Head is atraumatic, normocephalic.  No scleral icterus is noted.  NECK:  Soft, supple, nontender, nondistended.  No JVD.  No thyromegaly.  Oral mucosa is moist.  CARDIOVASCULAR:  Regular rate  and rhythm.  No rubs, gallops or murmurs.  LUNGS:  She has coarse breath sounds throughout with wheezing and some  slight rales at the bases.  ABDOMEN:  Obese, soft, nontender, nondistended.  Positive bowel sounds.  EXTREMITIES:  No clubbing, cyanosis or edema.  NEUROLOGICAL:  Cranial nerves II-XII grossly intact.  The patient is  alert and oriented x3.  She is awake and alert.   STUDIES:  Chest x-ray showed a minimal bronchitic changes in the left  base, atelectasis versus early infiltrate in the right base.   LABORATORY DATA:  ABG showed pH of 7.333, pCO2 60.8, pO2 63.4, bicarb  31.4, sodium 139, potassium 3.3, chloride 103, CO2 is 32, glucose 101,  BUN 10, creatinine 0.77.  White count 5.5, hemoglobin 10.3, hematocrit  34.1, platelets 327.   ASSESSMENT:  1. Pneumonia.  2. Hypokalemia.  3. Hypertension.  4. Anemia.  5. Tobacco abuse.   PLAN:  The patient will be  admitted to general medical bed, placed on IV  steroids as well as IV antibiotics.  The patient was kept on oxygen to  keep her sats above 90%.  For hyperkalemia, the patient's potassium will  be replaced orally and will be repeated in the morning.  Would also get  a magnesium phosphate level added to her a.m. labs.  Her anemia seems to  be chronic.  Will continue to watch her hemoglobin with CBCs at this  time.  For hypertension,  the patient will be placed on home medications and these will be  adjusted as needed for blood pressure control.  The patient may get a  repeat chest x-ray in the next 1-2 days also.  The patient will be also  placed on DVT as well as GI prophylaxis.      Skeet Latch, DO  Electronically Signed     SM/MEDQ  D:  08/22/2007  T:  08/23/2007  Job:  [id number]   cc:   Erle Crocker, M.D.

## 2011-03-30 NOTE — Discharge Summary (Signed)
[name], [name]           ACCOUNT NO.:  [id number]   MEDICAL RECORD NO.:  [id number]          PATIENT TYPE:  INP   LOCATION:  A338                          FACILITY:  [facility]   PHYSICIAN:  Osvaldo Shipper, MD     DATE OF BIRTH:  1941/04/22   DATE OF ADMISSION:  08/22/2007  DATE OF DISCHARGE:  10/10/2008LH                               DISCHARGE SUMMARY   PRIMARY CARE PHYSICIAN:  [name].   Please see the H&P dictated by Dr. [name] for details regarding  patient's presenting illness.   DISCHARGE DIAGNOSES:  1. Pneumonia, likely community-acquired.  2. Likely chronic obstructive pulmonary disease.  3. History of sleep apnea, on CPAP.  4. History of hypertension, stable.  5. History of mediastinal lymphadenopathy requiring followup.   BRIEF HOSPITAL COURSE:  1. Shortness of breath.  This is a 70 year old African-American female      who was sent by her doctor, after she was hypoxic at the doctor's      office.  The patient had presented to the office complaining of      shortness of breath.  The patient was having some wheezing.  There      were some crackles in the lungs, and her x-ray suggested possible      pneumonia.  The patient was subsequently admitted to the hospital,      started on antibiotics with ceftriaxone and azithromycin.  She was      put on oxygen.  She was also treated with nebulizer treatment and      steroids.  The patient's symptoms have improved significantly over      the past three or four days.  She, however, continues to be hypoxic      at 83%.  To think of other differentials, we obtained a D-dimer      which was mildly elevated.  This prompted a CT scan of the chest,      which showed mild bilateral lower lobe infiltrative changes,      moderately enlarged mediastinal lymph notes were also noted.  Small      pericardial effusion was noted.  The reason for this      lymphadenopathy is not really clear.  It could be reactive, because    of the pneumonia.  Other differentials include lymphoma and      sarcoidosis.  We are going to get a repeat CT in three months'      time.  This has been set up for January of 2009.  If that CT      continues to show enlarged lymph nodes, she will need further      workup in the form of pulmonary consult.  2. She remains afebrile.  Her room air oxygen yesterday was 83%,      hence, she probably will need home O2.  We will recheck her room      air saturations today.   Of note, ABG was checked, which showed a pH of 7.33, with a PCO2 of 60,  the PaO2 was 63.  Hence, the patient does have evidence for COPD.  She  did admit to smoking one pack of cigarettes every day and has more than  40 pack-year history of smoking.  She was strongly counseled to stop  smoking, and all the risks were explained to her of continuing to smoke.  She was prescribed a nicotine patch to help with this.  She can discuss  with her PMD, regarding other means for smoking cessation.   Her hypertension was reasonably controlled.  Her anemia was also stable.  Her other medical issues were pretty stable as well.   This morning, the patient was feeling much better.  She ambulated in the  hallway with no difficulties.  She is keen on going home.  Her vital  signs are stable, except for the hypoxia, which we will recheck today on  room air.   Lungs reveals a few crackles at the right base; however, mostly clear on  auscultation.   PLAN:  As per above.   DISCHARGE MEDICATIONS:  1. Vantin 200 mg b.i.d. for 5 days.  2. Zithromax 500 mg daily for 5 days.  3. Xopenex 1.25 mg every 6 hours for 5 days and then as needed.  4. Atrovent 0.5 mg nebs every 6 hours for 5 days and then as needed.  5. Prednisone 60 mg for 3 days, 40 for 3 days, 20 for 3 days, and then      10 for 3 days.  6. Nicotine patch 21 mg daily.  Otherwise, will continue home medications which include:  1. Prozac 20 mg daily.  2. Diovan 320 mg daily.   3. Verapamil 240 mg b.i.d.  4. Prilosec 40 mg daily.  5. Aspirin 81 mg daily.  6. Calcium 500 mg daily.  7. Allegra 180 mg daily.  8. Tylenol and Naproxen as needed.   FOLLOWUP:  Dr. [name] in one week.  CT scan chest with contrast scheduled  for November 23, 2007, based on which she may need a referral to a  pulmonologist. The patient may also need to have a pulmonary function  test.  As an outpatient, I will defer this to Dr. [name] to follow up.   No consultations ordered during this admission.   Imaging studies were discussed earlier.   DIET:  She can have a heart-healthy diet.   PHYSICAL ACTIVITIES:  Increase activity slowly.  She may need home O2,  based on her evaluation today.   The discharge time was about 40 minutes.      Osvaldo Shipper, MD  Electronically Signed     GK/MEDQ  D:  08/25/2007  T:  08/25/2007  Job:  [id number]   cc:   Erle Crocker, M.D.

## 2011-04-02 NOTE — Procedures (Signed)
[name], [name]           ACCOUNT NO.:  [id number]   MEDICAL RECORD NO.:  [id number]          PATIENT TYPE:  OUT   LOCATION:  RAD                           FACILITY:  [facility]   PHYSICIAN:  Edward L. Juanetta Gosling, M.D.DATE OF BIRTH:  1941-04-15   DATE OF PROCEDURE:  DATE OF DISCHARGE:                            PULMONARY FUNCTION TEST   1. Spirometry shows a mild ventilatory defect with evidence of airflow      obstruction.  2. Lung volumes show normal total lung capacity but evidence of air      trapping.  3. DLCO is moderately reduced.  4. Arterial blood gases show hypoxemia and increased pCO2 suggesting      chronic respiratory failure.  5. There is no significant bronchodilator effect.  6. Particularly with a reduction in DLCO, this is consistent with a      diagnosis of emphysema.      Edward L. Juanetta Gosling, M.D.  Electronically Signed     ELH/MEDQ  D:  09/28/2007  T:  09/28/2007  Job:  [id number]   cc:   Erle Crocker, M.D.

## 2011-04-14 ENCOUNTER — Encounter: Payer: Self-pay | Admitting: Family Medicine

## 2011-04-16 ENCOUNTER — Ambulatory Visit (INDEPENDENT_AMBULATORY_CARE_PROVIDER_SITE_OTHER): Payer: Medicare PPO | Admitting: Family Medicine

## 2011-04-16 ENCOUNTER — Encounter: Payer: Self-pay | Admitting: Family Medicine

## 2011-04-16 VITALS — BP 110/68 | HR 89 | Ht 71.0 in | Wt 242.0 lb

## 2011-04-16 DIAGNOSIS — E785 Hyperlipidemia, unspecified: Secondary | ICD-10-CM

## 2011-04-16 DIAGNOSIS — I10 Essential (primary) hypertension: Secondary | ICD-10-CM

## 2011-04-16 DIAGNOSIS — M25559 Pain in unspecified hip: Secondary | ICD-10-CM

## 2011-04-16 DIAGNOSIS — Z23 Encounter for immunization: Secondary | ICD-10-CM

## 2011-04-16 DIAGNOSIS — R5383 Other fatigue: Secondary | ICD-10-CM

## 2011-04-16 DIAGNOSIS — J301 Allergic rhinitis due to pollen: Secondary | ICD-10-CM

## 2011-04-16 DIAGNOSIS — R5381 Other malaise: Secondary | ICD-10-CM

## 2011-04-16 DIAGNOSIS — J449 Chronic obstructive pulmonary disease, unspecified: Secondary | ICD-10-CM

## 2011-04-16 MED ORDER — KETOROLAC TROMETHAMINE 30 MG/ML IJ SOLN
60.0000 mg | Freq: Once | INTRAMUSCULAR | Status: AC
  Administered 2011-04-16: 60 mg via INTRAMUSCULAR

## 2011-04-16 NOTE — Patient Instructions (Signed)
F/u in 3.5 months.  Fasting labs are due , pls get them within the next week if possible.  pls be careful, and try not to fall, Toradol 60mg  today for hip pain  No med changes at this time

## 2011-04-16 NOTE — Progress Notes (Signed)
  Subjective:    Patient ID: [name], female    DOB: 03/13/1941, 70 y.o.   MRN: [id number]  HPI Chronic nausea, wiht  Bloating and belching,Gall bladder studies 1 year ago were normal  Recurrent falls, approx twice per month, does not want eval for wheelchair at this time and promises to be careful. Routine health maintenance and immunization reviewed     Review of Systems Denies recent fever or chills. Denies sinus pressure, nasal congestion, ear pain or sore throat. Denies chest congestion, productive cough or wheezing.Oxygen dependent COPD, continues to smoke. Denies chest pains, palpitations, paroxysmal nocturnal dyspnea, orthopnea and leg swelling Denies abdominal pain, nausea, vomiting,diarrhea or constipation.  Denies rectal bleeding or change in bowel movement. Denies dysuria,  Chronic joint pain, swelling and limitation in mobility.Uses a walker Denies headaches, seizure, numbness, or tingling. Denies uncontrolled  depression, anxiety or insomnia. Denies skin break down or rash.        Objective:   Physical Exam Patient alert and oriented and in no Cardiopulmonary distress.  HEENT: No facial asymmetry, EOMI, no sinus tenderness, TM's clear, Oropharynx pink and moist.  Neck  no adenopathy.  Chest: Clear to auscultation bilaterally.decreased air entry bilaterally, no crackles, few wheezes  CVS: S1, S2 no murmurs, no S3.  ABD: Soft non tender. Bowel sounds normal.  Ext: No edema  WG:NFAOZHYQM ROM spine, shoulders, hips and knees.  Skin: Intact, no ulcerations or rash noted.  Psych: Good eye contact, normal affect. Memory intact not anxious or depressed appearing.  CNS: CN 2-12 intact,        Assessment & Plan:

## 2011-04-17 LAB — HEPATIC FUNCTION PANEL
ALT: 12 U/L (ref 0–35)
Albumin: 4.5 g/dL (ref 3.5–5.2)
Indirect Bilirubin: 0.3 mg/dL (ref 0.0–0.9)
Total Protein: 7 g/dL (ref 6.0–8.3)

## 2011-04-17 LAB — CBC WITH DIFFERENTIAL/PLATELET
Basophils Absolute: 0 10*3/uL (ref 0.0–0.1)
Basophils Relative: 0 % (ref 0–1)
HCT: 38.5 % (ref 36.0–46.0)
Hemoglobin: 11.6 g/dL — ABNORMAL LOW (ref 12.0–15.0)
Lymphocytes Relative: 18 % (ref 12–46)
Monocytes Absolute: 0.4 10*3/uL (ref 0.1–1.0)
Monocytes Relative: 6 % (ref 3–12)
Neutro Abs: 4.4 10*3/uL (ref 1.7–7.7)
Neutrophils Relative %: 72 % (ref 43–77)
RDW: 15.2 % (ref 11.5–15.5)
WBC: 6.2 10*3/uL (ref 4.0–10.5)

## 2011-04-17 LAB — LIPID PANEL
Cholesterol: 193 mg/dL (ref 0–200)
HDL: 59 mg/dL (ref >39)
LDL Cholesterol: 118 mg/dL — ABNORMAL HIGH (ref 0–99)
Total CHOL/HDL Ratio: 3.3 Ratio
Triglycerides: 78 mg/dL (ref <150)
VLDL: 16 mg/dL (ref 0–40)

## 2011-04-17 LAB — BASIC METABOLIC PANEL
BUN: 22 mg/dL (ref 6–23)
Chloride: 100 mEq/L (ref 96–112)
Potassium: 4.6 mEq/L (ref 3.5–5.3)
Sodium: 140 mEq/L (ref 135–145)

## 2011-04-17 LAB — TSH: TSH: 0.643 u[IU]/mL (ref 0.350–4.500)

## 2011-04-18 NOTE — Assessment & Plan Note (Signed)
Improved, low fat diet encouraged , no med change 

## 2011-04-18 NOTE — Assessment & Plan Note (Signed)
Controlled, no change in medication  

## 2011-04-18 NOTE — Assessment & Plan Note (Signed)
increasd symptoms at this time , which is as expected

## 2011-04-18 NOTE — Assessment & Plan Note (Signed)
Unchanged, continue oxygen at same rate

## 2011-04-18 NOTE — Assessment & Plan Note (Signed)
Deteriorated, toradol administered at [facility]

## 2011-06-09 ENCOUNTER — Telehealth: Payer: Self-pay | Admitting: Family Medicine

## 2011-06-09 DIAGNOSIS — I10 Essential (primary) hypertension: Secondary | ICD-10-CM

## 2011-06-09 DIAGNOSIS — F329 Major depressive disorder, single episode, unspecified: Secondary | ICD-10-CM

## 2011-06-09 DIAGNOSIS — K219 Gastro-esophageal reflux disease without esophagitis: Secondary | ICD-10-CM

## 2011-06-09 DIAGNOSIS — F32A Depression, unspecified: Secondary | ICD-10-CM

## 2011-06-09 DIAGNOSIS — M25552 Pain in left hip: Secondary | ICD-10-CM

## 2011-06-09 MED ORDER — FLUOXETINE HCL 40 MG PO CAPS: 40.0000 mg | ORAL_CAPSULE | Freq: Every day | ORAL | Status: DC

## 2011-06-09 MED ORDER — OMEPRAZOLE 40 MG PO CPDR: 40.0000 mg | DELAYED_RELEASE_CAPSULE | Freq: Every day | ORAL | Status: DC

## 2011-06-09 MED ORDER — VERAPAMIL HCL ER 240 MG PO TBCR: 240.0000 mg | EXTENDED_RELEASE_TABLET | Freq: Every day | ORAL | Status: DC

## 2011-06-09 MED ORDER — NAPROXEN 500 MG PO TABS: 500.0000 mg | ORAL_TABLET | Freq: Two times a day (BID) | ORAL | Status: DC

## 2011-06-09 NOTE — Telephone Encounter (Signed)
meds sent as requested 

## 2011-06-21 ENCOUNTER — Telehealth: Payer: Self-pay | Admitting: Family Medicine

## 2011-06-21 ENCOUNTER — Encounter: Payer: Self-pay | Admitting: Family Medicine

## 2011-06-21 NOTE — Telephone Encounter (Signed)
Aurea Graff scheduled this patient for an OV

## 2011-06-22 ENCOUNTER — Emergency Department (HOSPITAL_COMMUNITY): Payer: Medicare PPO

## 2011-06-22 ENCOUNTER — Other Ambulatory Visit: Payer: Self-pay

## 2011-06-22 ENCOUNTER — Emergency Department (HOSPITAL_COMMUNITY)
Admission: EM | Admit: 2011-06-22 | Discharge: 2011-06-22 | Disposition: A | Discharge status: Home / Self Care | Payer: Medicare PPO | Attending: Emergency Medicine | Admitting: Emergency Medicine

## 2011-06-22 ENCOUNTER — Ambulatory Visit: Payer: Medicare PPO | Admitting: Family Medicine

## 2011-06-22 ENCOUNTER — Encounter (HOSPITAL_COMMUNITY): Payer: Self-pay | Admitting: Emergency Medicine

## 2011-06-22 DIAGNOSIS — Z79899 Other long term (current) drug therapy: Secondary | ICD-10-CM | POA: Insufficient documentation

## 2011-06-22 DIAGNOSIS — K219 Gastro-esophageal reflux disease without esophagitis: Secondary | ICD-10-CM | POA: Insufficient documentation

## 2011-06-22 DIAGNOSIS — F341 Dysthymic disorder: Secondary | ICD-10-CM | POA: Insufficient documentation

## 2011-06-22 DIAGNOSIS — J4489 Other specified chronic obstructive pulmonary disease: Secondary | ICD-10-CM | POA: Insufficient documentation

## 2011-06-22 DIAGNOSIS — M545 Low back pain, unspecified: Secondary | ICD-10-CM | POA: Insufficient documentation

## 2011-06-22 DIAGNOSIS — J449 Chronic obstructive pulmonary disease, unspecified: Secondary | ICD-10-CM | POA: Insufficient documentation

## 2011-06-22 DIAGNOSIS — G4733 Obstructive sleep apnea (adult) (pediatric): Secondary | ICD-10-CM | POA: Insufficient documentation

## 2011-06-22 DIAGNOSIS — M25469 Effusion, unspecified knee: Secondary | ICD-10-CM | POA: Insufficient documentation

## 2011-06-22 LAB — CBC
HCT: 35.4 % — ABNORMAL LOW (ref 36.0–46.0)
Hemoglobin: 10.7 g/dL — ABNORMAL LOW (ref 12.0–15.0)
RBC: 4.19 MIL/uL (ref 3.87–5.11)

## 2011-06-22 LAB — BASIC METABOLIC PANEL
BUN: 23 mg/dL (ref 6–23)
CO2: 28 mEq/L (ref 19–32)
Glucose, Bld: 91 mg/dL (ref 70–99)
Potassium: 3.7 mEq/L (ref 3.5–5.1)
Sodium: 141 mEq/L (ref 135–145)

## 2011-06-22 MED ORDER — PREDNISONE 50 MG PO TABS: 50.0000 mg | ORAL_TABLET | Freq: Every day | ORAL | Status: AC

## 2011-06-22 MED ORDER — PREDNISONE 20 MG PO TABS
60.0000 mg | ORAL_TABLET | Freq: Once | ORAL | Status: AC
  Administered 2011-06-22: 60 mg via ORAL
  Filled 2011-06-22: qty 3

## 2011-06-22 MED ORDER — ALBUTEROL SULFATE HFA 108 (90 BASE) MCG/ACT IN AERS: 2.0000 | INHALATION_SPRAY | RESPIRATORY_TRACT | Status: DC | PRN

## 2011-06-22 MED ORDER — ALBUTEROL SULFATE (5 MG/ML) 0.5% IN NEBU
5.0000 mg | INHALATION_SOLUTION | Freq: Once | RESPIRATORY_TRACT | Status: AC
  Administered 2011-06-22: 5 mg via RESPIRATORY_TRACT
  Filled 2011-06-22: qty 1

## 2011-06-22 MED ORDER — IPRATROPIUM BROMIDE 0.02 % IN SOLN
0.5000 mg | Freq: Once | RESPIRATORY_TRACT | Status: AC
  Administered 2011-06-22: 0.5 mg via RESPIRATORY_TRACT
  Filled 2011-06-22: qty 2.5

## 2011-06-22 NOTE — ED Notes (Signed)
Pt presents with cough, chest congestion and c/o lt knee pain after a fall at home yesterday. Pt is oxygen dependent at home, 2 LPM [place] 24/7.  Pt has bilateral decreased lower lung function audible. Pt states  Has yellow sputum and chest tightness only when coughing.

## 2011-06-22 NOTE — ED Notes (Signed)
RTT paged  for albuterol treatment. Pt remains stable at this time.

## 2011-06-22 NOTE — ED Notes (Signed)
Budesonide (pulmicort) breathing treatments completed at home twice a day.

## 2011-06-22 NOTE — Discharge Instructions (Signed)
Chronic Obstructive Pulmonary Disease (COPD) Chronic obstructive pulmonary disease (COPD) is a condition in which airflow from the lungs is restricted. The lungs can never return to normal, but there are measures you can take which will improve them and make you feel better. CAUSES  Smoking.   Breathing in irritants (pollution, cigarette smoke, strong odors, aerosol sprays, paint fumes).   History of lung infections.  TREATMENT  Treatment focuses on making you comfortable (supportive care).  HOME CARE INSTRUCTIONS  If you smoke, stop smoking. The carbon monoxide buildup in the blood robs you of your already short oxygen supply.   Take medicines (antibiotics) that kill germs as directed.   Avoid antihistamines and cough syrups. They dry up your system and slow down the elimination of secretions. This decreases respiratory capacity and may lead to infections.   Drink enough water and fluids to keep your urine clear or pale yellow. This loosens secretions.   Use humidifiers at home and at your bedside if they do not make breathing difficult.   Receive all protective vaccines your caregiver suggests, especially pneumococcal and influenza.   Use home oxygen as suggested.  SEEK MEDICAL CARE IF:  You develop pus-like mucus (sputum).   You have an oral temperature above 100.79F.   Breathing is more labored or exercise becomes difficult to do.   You are running out of the medicine you take for your breathing.  SEEK IMMEDIATE MEDICAL CARE IF:  You have a rapid heart rate.   You have agitation, confusion, tremors, or are in a stupor (family members may need to observe this).   It becomes difficult to breathe.   You develop chest pain.   You have an oral temperature above 100.79F, not controlled by medicine.  MAKE SURE YOU:   Understand these instructions.   Will watch your condition.   Will get help right away if you are not doing well or get worse.  Document Released:  08/11/2005 Document Re-Released: 01/26/2010 [facility] Patient Information 2011 [place], [place].

## 2011-06-22 NOTE — ED Provider Notes (Signed)
History    Scribed for Gabriella Gaskins, MD, the patient was seen in room APA02/APA02 . This chart was scribed by Desma Paganini. This patient's care was started at 12:05 PM .   CSN: [id number] Arrival date & time: 06/22/2011 11:03 AM  Chief Complaint  Patient presents with  . Cough    Pt c/o since last Friday. Fell at home yesterday.   HPI [name] is a 70 y.o. female who presents to the Emergency Department complaining of cough and congestion that started 4 days ago. States she has been coughing up thick sputum and had chest tightness before she coughed up the sputum but tightness is improved now. She has difficulty breathing, worse while walking but denies fever, chills, ha, and n/v. She has h/o asthma and COPD and is on 2L of oxygen at home. Pt also with separate c/o tenderness in left knee since falling yesterday when her leg gave out. No numbness, tingling, or weakness. No other injury.  PAST MEDICAL HISTORY:  Past Medical History  Diagnosis Date  . Allergic rhinitis   . Anemia   . Anxiety   . Depression   . GERD (gastroesophageal reflux disease)   . Hypertension   . Low back pain   . Arthritis     abnormal gait   . OSA on CPAP   . Degenerative disc disease, lumbar     L5 nerve impingement   . 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 Arm fracture, left   . Onychomycosis   . Ankle fracture, right   . Hyperglycemia   . Mediastinal lymphadenopathy 1/9    resolving   . COPD (chronic obstructive pulmonary disease)     chronic CO2 retention, decreased DLCO   . Cystic acne     adult      PAST SURGICAL HISTORY:  Past Surgical History  Procedure Date  . Abdominal hysterectomy 1976    secondary to bleeding   . Oophorectomy 1976  . Epidural steroids      MEDICATIONS:  Previous Medications   CALCIUM-VITAMIN D (OSCAL WITH D) 500-200 MG-UNIT PER TABLET    Take 1 tablet by mouth 2 (two) times daily.     FLUOXETINE (PROZAC) 40 MG CAPSULE    Take 1  capsule (40 mg total) by mouth daily.   LOVASTATIN (MEVACOR) 40 MG TABLET    Take 40 mg by mouth at bedtime.     MULTIPLE VITAMINS-MINERALS (CEROVITE SENIOR) TABS    Take by mouth daily.     NAPROXEN (NAPROSYN) 500 MG TABLET    Take 1 tablet (500 mg total) by mouth 2 (two) times daily with a meal.   OMEPRAZOLE (PRILOSEC) 40 MG CAPSULE    Take 1 capsule (40 mg total) by mouth daily.   VERAPAMIL (CALAN-SR) 240 MG CR TABLET    Take 1 tablet (240 mg total) by mouth at bedtime.   VITAMIN C (ASCORBIC ACID) 500 MG TABLET    Take 1,000 mg by mouth daily.       ALLERGIES:  Allergies as of 06/22/2011 - Review Complete 06/22/2011  Allergen Reaction Noted  . Ciprofloxacin    . Pravastatin sodium    . Sulfonamide derivatives Other (See Comments) 02/17/2010     FAMILY HISTORY:  Family History  Problem Relation Age of Onset  . Stomach cancer Father   . Breast cancer Father   . Cancer Father   . Cancer Sister   . Hypertension Son   .  Cancer Mother      SOCIAL HISTORY: History   Social History  . Marital Status: Married    Spouse Name: N/A    Number of Children: 4  . Years of Education: N/A   Occupational History  . employed in the tobacco industry     Social History Main Topics  . Smoking status: Former Smoker    Quit date: 06/21/2006  . Smokeless tobacco: Never Used  . Alcohol Use: No  . Drug Use: No  . Sexually Active: No   Other Topics Concern  . None   Social History Narrative  . None      OB History    Grav Para Term Preterm Abortions TAB SAB Ect Mult Living   5 4 4  1  1          Review of Systems 10 Systems reviewed and are negative for acute change except as noted in the HPI.  Physical Exam  BP 160/91  Pulse 101  Temp(Src) 98.5 F (36.9 C) (Oral)  Resp 20  Ht 6\' 1"  (1.854 m)  Wt 245 lb (111.131 kg)  BMI 32.32 kg/m2  SpO2 97%  Physical Exam CONSTITUTIONAL: Well developed/well nourished HEAD AND FACE: Normocephalic/atraumatic EYES: EOMI/PERRL ENMT:  Mucous membranes moist NECK: supple no meningeal signs SPINE:entire spine nontender CV: S1/S2 noted, no murmurs/rubs/gallops noted LUNGS:  no apparent distress; wheezing; decreased breath sounds ABDOMEN: soft, nontender, no rebound or guarding GU:no cva tenderness NEURO: Pt is awake/alert, moves all extremitiesx4 EXTREMITIES: pulses normal, full ROM; no swelling; tenderness in left patella SKIN: warm, color normal PSYCH: no abnormalities of mood noted   ED Course  Procedures  OTHER DATA REVIEWED: Nursing notes and vital signs reviewed.  DIAGNOSTIC STUDIES: Oxygen Saturation is 97% on 2L O2 per nasal cannula, normal by my interpretation.     Date: 06/22/2011  Rate: 93  Rhythm: normal sinus rhythm  QRS Axis: normal  Intervals: normal  ST/T Wave abnormalities: nonspecific ST changes  Conduction Disutrbances:none  Narrative Interpretation:   Old EKG Reviewed: none available     LABS / RADIOLOGY:  Results for orders placed during the hospital encounter of 06/22/11  CBC      Component Value Range   WBC 6.5  4.0 - 10.5 (K/uL)   RBC 4.19  3.87 - 5.11 (MIL/uL)   Hemoglobin 10.7 (*) 12.0 - 15.0 (g/dL)   HCT 45.4 (*) 09.8 - 46.0 (%)   MCV 84.5  78.0 - 100.0 (fL)   MCH 25.5 (*) 26.0 - 34.0 (pg)   MCHC 30.2  30.0 - 36.0 (g/dL)   RDW 11.9 (*) 14.7 - 15.5 (%)   Platelets 228  150 - 400 (K/uL)  BASIC METABOLIC PANEL      Component Value Range   Sodium 141  135 - 145 (mEq/L)   Potassium 3.7  3.5 - 5.1 (mEq/L)   Chloride 102  96 - 112 (mEq/L)   CO2 28  19 - 32 (mEq/L)   Glucose, Bld 91  70 - 99 (mg/dL)   BUN 23  6 - 23 (mg/dL)   Creatinine, Ser 8.29  0.50 - 1.10 (mg/dL)   Calcium 9.7  8.4 - 56.2 (mg/dL)   GFR calc non Af Amer >60  >60 (mL/min)   GFR calc Af Amer >60  >60 (mL/min)   Dg Chest Portable 1 View  06/22/2011  *RADIOLOGY REPORT*  Clinical Data: History of shortness of breath.  PORTABLE CHEST - 1 VIEW  Comparison: 08/24/2007.  CT 11/23/2007.  Findings: There is  borderline enlargement of the cardiac silhouette accentuated by AP magnification.  There is slight elevation of the left hemidiaphragm with slight atelectasis.  No pulmonary edema, pneumonia, or pleural effusion is evident.  No pneumothorax is seen.  Osteophytes are present in the spine.  IMPRESSION: Borderline cardiac silhouette enlargement.  Elevation of left hemidiaphragm with slight left basilar atelectasis.  Original Report Authenticated By: Crawford Givens, M.D.   Dg Knee Left Port  06/22/2011  *RADIOLOGY REPORT*  Clinical Data: History of left knee pain.  PORTABLE LEFT KNEE - 1-2 VIEW  Comparison: 10/11/2005.  Findings: There is fullness in the suprapatellar region on lateral image consistent with element of joint effusion.  There is posterior osteophyte formation and the anterior superior enthesophyte formation involving the patella.  There is narrowing of the inferior patellofemoral joint space.  There is narrowing of the medial and lateral joint spaces with marginal osteophyte formation representing osteoarthritic changes.  Meniscal calcifications are seen representing chondrocalcinosis.  No fracture, dislocation, or bony destruction is seen.  No definite opaque loose body is evident.  IMPRESSION: There appears to be a joint effusion.  Three compartment osteoarthritic changes.  Meniscal calcifications representing chondrocalcinosis.  Chondrocalcinosis may be associated with gout and the various causes of pseudogout syndrome including calcium pyrophosphate deposition disease.  Original Report Authenticated By: Crawford Givens, M.D.      ED COURSE / COORDINATION OF CARE: Patient feeling better 2:00 PM    MDM: Pt stable on baseline O2 Pt with cough/wheeze, improved after nebs.  She wants to go home, suspect this was flare of COPD.         I personally performed the services described in this documentation, which was scribed in my presence. The recorded information has been reviewed and considered.  Gabriella Gaskins, MD    Gabriella Gaskins, MD 06/22/11 [phone]

## 2011-06-22 NOTE — ED Notes (Signed)
Pt c/o cough and congestion that started last Fri. Coughing up thick yellow sputum. Pt currently on O2 at 2L via [place], wears all the time. Pt also fell at home yesterday injuring her L knee.

## 2011-06-22 NOTE — ED Notes (Signed)
MD at bedside. Dr Bebe Shaggy at bedside talking and examining pt.

## 2011-06-22 NOTE — ED Notes (Signed)
MD at bedside. Dr Bebe Shaggy at bedside to reassess pt and discuss plan of care.

## 2011-07-05 ENCOUNTER — Telehealth: Payer: Self-pay | Admitting: Family Medicine

## 2011-07-05 NOTE — Telephone Encounter (Signed)
Ask if strong enough to use a manual wheelchair, with oxygen she may qualify for a motorized/power wchair, if she feels she needs that I need to know to refer her to PT for further assesment , but I think she may qualify. Sorry to hear about the falls, will send in for chairas soon as i am clear about what is going on

## 2011-07-06 NOTE — Telephone Encounter (Signed)
Called patient, no answer 

## 2011-07-07 NOTE — Telephone Encounter (Signed)
Patient is requesting a Manufacturing engineer wheelchair

## 2011-07-07 NOTE — Telephone Encounter (Signed)
Script faxed to [facility] and patient aware

## 2011-07-07 NOTE — Telephone Encounter (Signed)
Script written , pls fax and let her know 

## 2011-07-08 ENCOUNTER — Telehealth: Payer: Self-pay | Admitting: Orthopedic Surgery

## 2011-07-08 NOTE — Telephone Encounter (Signed)
Patient requests appointment following [facility] visit 06/22/11 for left knee. States left knee has been giving out, and said right knee is almost as bad.  Patient had Xrays at the ER on this date on left knee.  Scheduled appointment in our office 08/03/11 (offered earlier date, unable to come in the previous week).  Advised also to notify her primary care physician of the emergency room visit.

## 2011-07-12 ENCOUNTER — Telehealth: Payer: Self-pay | Admitting: Family Medicine

## 2011-07-12 NOTE — Telephone Encounter (Signed)
Patients script was sent to [facility], due to insurance this has to go to [place] instead, can you re write script so this can be sent to [facility]

## 2011-07-12 NOTE — Telephone Encounter (Signed)
Written pls send and let her know

## 2011-07-12 NOTE — Telephone Encounter (Signed)
Sent and patient aware

## 2011-07-16 ENCOUNTER — Telehealth: Payer: Self-pay | Admitting: Family Medicine

## 2011-07-16 NOTE — Telephone Encounter (Signed)
error 

## 2011-07-20 NOTE — Telephone Encounter (Signed)
PATIENT IS REQUESTING BROVANA WHICH IS NOT ON HER MED LIST AND WANTS IT SENT TO [facility], [facility] HAS CALLED AS WELL  PHARMACIST LINE [phone]

## 2011-07-21 ENCOUNTER — Telehealth: Payer: Self-pay

## 2011-07-21 ENCOUNTER — Other Ambulatory Visit: Payer: Self-pay | Admitting: Family Medicine

## 2011-07-21 DIAGNOSIS — J449 Chronic obstructive pulmonary disease, unspecified: Secondary | ICD-10-CM

## 2011-07-21 MED ORDER — ARFORMOTEROL TARTRATE 15 MCG/2ML IN NEBU: 15.0000 ug | INHALATION_SOLUTION | Freq: Two times a day (BID) | RESPIRATORY_TRACT | Status: DC

## 2011-07-21 NOTE — Telephone Encounter (Signed)
completed

## 2011-07-21 NOTE — Telephone Encounter (Signed)
Printed, pls fax , an d notify pt and supplier.

## 2011-07-21 NOTE — Telephone Encounter (Signed)
Medication faxed

## 2011-08-03 ENCOUNTER — Ambulatory Visit (HOSPITAL_COMMUNITY)
Admission: RE | Admit: 2011-08-03 | Discharge: 2011-08-03 | Disposition: A | Discharge status: Home / Self Care | Payer: Medicare PPO | Source: Ambulatory Visit | Attending: Orthopedic Surgery | Admitting: Orthopedic Surgery

## 2011-08-03 ENCOUNTER — Other Ambulatory Visit: Payer: Self-pay | Admitting: Orthopedic Surgery

## 2011-08-03 ENCOUNTER — Ambulatory Visit (INDEPENDENT_AMBULATORY_CARE_PROVIDER_SITE_OTHER): Payer: Medicare PPO | Admitting: Orthopedic Surgery

## 2011-08-03 ENCOUNTER — Encounter: Payer: Self-pay | Admitting: Orthopedic Surgery

## 2011-08-03 VITALS — Resp 22 | Wt 242.0 lb

## 2011-08-03 DIAGNOSIS — M4800 Spinal stenosis, site unspecified: Secondary | ICD-10-CM

## 2011-08-03 DIAGNOSIS — M545 Low back pain, unspecified: Secondary | ICD-10-CM | POA: Insufficient documentation

## 2011-08-03 DIAGNOSIS — M549 Dorsalgia, unspecified: Secondary | ICD-10-CM

## 2011-08-04 ENCOUNTER — Encounter: Payer: Self-pay | Admitting: Orthopedic Surgery

## 2011-08-04 NOTE — Progress Notes (Signed)
Chief complaint bilateral knee pain LEFT hip radiating pain  Pain started 6 months ago  Pain started gradually  Patient reports injury secondary to a fall.  She's been treated with oral medication including naproxen  Pain is described as dull  Pain is described as 7/10 and intermittent  Pain is described as occurring in the morning when she wakes up.  She says she has to stand prolonged before she can get going.  Sitting for long periods of time also increase the pain and the pain is associated with bruising numbness and swelling  A 14 system review was undertaken.  The patient reports wheezing as the only positive finding.  Past Medical History  Diagnosis Date  . Allergic rhinitis   . Anemia   . Anxiety   . Depression   . GERD (gastroesophageal reflux disease)   . Hypertension   . Low back pain   . Arthritis     abnormal gait   . OSA on CPAP   . Degenerative disc disease, lumbar     L5 nerve impingement   . 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 Arm fracture, left   . Onychomycosis   . Ankle fracture, right   . Hyperglycemia   . Mediastinal lymphadenopathy 1/9    resolving   . COPD (chronic obstructive pulmonary disease)     chronic CO2 retention, decreased DLCO   . Cystic acne     adult     Past Surgical History  Procedure Date  . Abdominal hysterectomy 1976    secondary to bleeding   . Oophorectomy 1976  . Epidural steroids     Physical Exam(12) GENERAL: normal development   CDV: pulses are normal   Skin: normal  Lymph: nodes were not palpable/normal  Psychiatric: awake, alert and oriented  Neuro: normal sensation  MSK gait normal  1Extremities Inspection shows no gross abnormalities.  Mild tenderness around the knee joint. 2 Range of motion is normal in both knees and hips 3 Motor exam is graded 5 in all muscles of the lower extremity 4 Extremity joints are stable  Lumbar spine no major tenderness in the lumbar spine although  the LEFT gluteal region is tender and straight leg raise is mildly positive at 40 LEFT leg.   Assessment: Back pain    Plan: Computers were down during this evaluation I believe the patient was sent for some physical therapy.  I am not sure who took an x-ray although I do remember possibility of her having degenerative disc disease.

## 2011-08-06 ENCOUNTER — Other Ambulatory Visit: Payer: Self-pay | Admitting: Family Medicine

## 2011-08-06 DIAGNOSIS — F32A Depression, unspecified: Secondary | ICD-10-CM

## 2011-08-06 DIAGNOSIS — F329 Major depressive disorder, single episode, unspecified: Secondary | ICD-10-CM

## 2011-08-06 DIAGNOSIS — I10 Essential (primary) hypertension: Secondary | ICD-10-CM

## 2011-08-06 DIAGNOSIS — K219 Gastro-esophageal reflux disease without esophagitis: Secondary | ICD-10-CM

## 2011-08-06 DIAGNOSIS — M25552 Pain in left hip: Secondary | ICD-10-CM

## 2011-08-06 DIAGNOSIS — J449 Chronic obstructive pulmonary disease, unspecified: Secondary | ICD-10-CM

## 2011-08-06 MED ORDER — VERAPAMIL HCL ER 240 MG PO TBCR: 240.0000 mg | EXTENDED_RELEASE_TABLET | Freq: Every day | ORAL | Status: DC

## 2011-08-06 MED ORDER — OMEPRAZOLE 40 MG PO CPDR: 40.0000 mg | DELAYED_RELEASE_CAPSULE | Freq: Every day | ORAL | Status: DC

## 2011-08-06 MED ORDER — NAPROXEN 500 MG PO TABS: 500.0000 mg | ORAL_TABLET | Freq: Two times a day (BID) | ORAL | Status: DC

## 2011-08-06 MED ORDER — FLUOXETINE HCL 40 MG PO CAPS: 40.0000 mg | ORAL_CAPSULE | Freq: Every day | ORAL | Status: DC

## 2011-08-06 MED ORDER — ARFORMOTEROL TARTRATE 15 MCG/2ML IN NEBU: 15.0000 ug | INHALATION_SOLUTION | Freq: Two times a day (BID) | RESPIRATORY_TRACT | Status: DC

## 2011-08-06 NOTE — Telephone Encounter (Signed)
Sent in to [facility] 

## 2011-08-17 ENCOUNTER — Ambulatory Visit: Payer: Medicare PPO | Admitting: Orthopedic Surgery

## 2011-08-18 ENCOUNTER — Ambulatory Visit (INDEPENDENT_AMBULATORY_CARE_PROVIDER_SITE_OTHER): Payer: Medicare PPO | Admitting: Orthopedic Surgery

## 2011-08-18 ENCOUNTER — Encounter: Payer: Self-pay | Admitting: Orthopedic Surgery

## 2011-08-18 VITALS — Ht 71.0 in | Wt 242.0 lb

## 2011-08-18 DIAGNOSIS — M5137 Other intervertebral disc degeneration, lumbosacral region: Secondary | ICD-10-CM

## 2011-08-18 DIAGNOSIS — M5136 Other intervertebral disc degeneration, lumbar region: Secondary | ICD-10-CM

## 2011-08-18 MED ORDER — HYDROCODONE-ACETAMINOPHEN 5-325 MG PO TABS: 1.0000 | ORAL_TABLET | Freq: Four times a day (QID) | ORAL | Status: AC | PRN

## 2011-08-18 NOTE — Patient Instructions (Signed)
Neurology consult: left leg giving out  Physical therapy will be ordered please call the number on the order sheet   Start pain medication take as ordered

## 2011-08-18 NOTE — Progress Notes (Signed)
Followup visit  The patient returns after treatment for back and leg pain with left leg weakness and giving way  She is improved slightly with Naprosyn. Her numbness has improved however her leg is still weak she still having giving way episodes still requiring a walker.  Presumptive diagnosis spinal stenosis   Review of systems he denies bowel or bladder dysfunction or urinary retention   The patient will work prior MRI of the lumbar spine.  She can start physical therapy on the lumbar spine to improve her back mechanics. She should have a neurology consult to evaluate her left leg giving way and weakness.

## 2011-08-19 ENCOUNTER — Encounter: Payer: Self-pay | Admitting: Family Medicine

## 2011-08-20 ENCOUNTER — Other Ambulatory Visit: Payer: Self-pay | Admitting: Orthopedic Surgery

## 2011-08-20 DIAGNOSIS — R29898 Other symptoms and signs involving the musculoskeletal system: Secondary | ICD-10-CM

## 2011-08-24 ENCOUNTER — Ambulatory Visit (INDEPENDENT_AMBULATORY_CARE_PROVIDER_SITE_OTHER): Payer: Medicare PPO | Admitting: Family Medicine

## 2011-08-24 ENCOUNTER — Encounter: Payer: Self-pay | Admitting: Family Medicine

## 2011-08-24 VITALS — BP 140/80 | HR 88 | Resp 16 | Ht 73.0 in | Wt 238.1 lb

## 2011-08-24 DIAGNOSIS — J449 Chronic obstructive pulmonary disease, unspecified: Secondary | ICD-10-CM

## 2011-08-24 DIAGNOSIS — IMO0002 Reserved for concepts with insufficient information to code with codable children: Secondary | ICD-10-CM

## 2011-08-24 DIAGNOSIS — Z23 Encounter for immunization: Secondary | ICD-10-CM

## 2011-08-24 DIAGNOSIS — F329 Major depressive disorder, single episode, unspecified: Secondary | ICD-10-CM

## 2011-08-24 DIAGNOSIS — E785 Hyperlipidemia, unspecified: Secondary | ICD-10-CM

## 2011-08-24 DIAGNOSIS — M25552 Pain in left hip: Secondary | ICD-10-CM

## 2011-08-24 DIAGNOSIS — I10 Essential (primary) hypertension: Secondary | ICD-10-CM

## 2011-08-24 DIAGNOSIS — M25559 Pain in unspecified hip: Secondary | ICD-10-CM

## 2011-08-24 DIAGNOSIS — K219 Gastro-esophageal reflux disease without esophagitis: Secondary | ICD-10-CM

## 2011-08-24 LAB — BLOOD GAS, ARTERIAL
TCO2: 28
pCO2 arterial: 54.3 — ABNORMAL HIGH
pH, Arterial: 7.369
pO2, Arterial: 55.3 — ABNORMAL LOW

## 2011-08-24 LAB — CARBOXYHEMOGLOBIN
Carboxyhemoglobin: 0.7
O2 Saturation: 87.2
Total hemoglobin: 11.8 — ABNORMAL LOW
Total oxygen content: 14.2 — ABNORMAL LOW

## 2011-08-24 MED ORDER — ARFORMOTEROL TARTRATE 15 MCG/2ML IN NEBU: 15.0000 ug | INHALATION_SOLUTION | Freq: Two times a day (BID) | RESPIRATORY_TRACT | Status: DC

## 2011-08-24 NOTE — Patient Instructions (Signed)
F/u in 4 months.  Fasting labs before next visit.  Pls take all meds as prescribed.  Flu vaccine today.  Call and check on the coverage for the shingles vaccine please

## 2011-08-25 DIAGNOSIS — Z23 Encounter for immunization: Secondary | ICD-10-CM

## 2011-08-25 MED ORDER — OMEPRAZOLE 40 MG PO CPDR: 40.0000 mg | DELAYED_RELEASE_CAPSULE | Freq: Every day | ORAL | Status: DC

## 2011-08-25 MED ORDER — VERAPAMIL HCL ER 240 MG PO TBCR: 240.0000 mg | EXTENDED_RELEASE_TABLET | Freq: Every day | ORAL | Status: DC

## 2011-08-25 MED ORDER — NAPROXEN 500 MG PO TABS: 500.0000 mg | ORAL_TABLET | Freq: Two times a day (BID) | ORAL | Status: DC

## 2011-08-26 ENCOUNTER — Telehealth: Payer: Self-pay | Admitting: Family Medicine

## 2011-08-26 DIAGNOSIS — J449 Chronic obstructive pulmonary disease, unspecified: Secondary | ICD-10-CM

## 2011-08-26 LAB — CBC
Hemoglobin: 10.3 — ABNORMAL LOW
Hemoglobin: 10.7 — ABNORMAL LOW
MCHC: 30.2
MCHC: 30.3
MCV: 74.1 — ABNORMAL LOW
RBC: 4.6
RBC: 4.77

## 2011-08-26 LAB — BLOOD GAS, ARTERIAL
Acid-Base Excess: 3.7 — ABNORMAL HIGH
Acid-Base Excess: 5.8 — ABNORMAL HIGH
O2 Content: 4
O2 Saturation: 89.7
TCO2: 29.5
pCO2 arterial: 57.2
pCO2 arterial: 60.8
pH, Arterial: 7.328 — ABNORMAL LOW
pO2, Arterial: 56.6 — ABNORMAL LOW
pO2, Arterial: 63.4 — ABNORMAL LOW

## 2011-08-26 LAB — DIFFERENTIAL
Band Neutrophils: 0
Band Neutrophils: 0
Basophils Relative: 0
Basophils Relative: 0
Blasts: 0
Lymphocytes Relative: 9 — ABNORMAL LOW
Metamyelocytes Relative: 0
Monocytes Relative: 2 — ABNORMAL LOW
Myelocytes: 0
Neutrophils Relative %: 89 — ABNORMAL HIGH
Promyelocytes Absolute: 0
Promyelocytes Absolute: 0

## 2011-08-26 LAB — BASIC METABOLIC PANEL
CO2: 29
CO2: 32
Calcium: 8.9
Chloride: 103
Creatinine, Ser: 0.71
GFR calc Af Amer: >60
GFR calc Af Amer: >60
GFR calc non Af Amer: >60
Sodium: 139
Sodium: 141

## 2011-08-26 LAB — URINALYSIS, ROUTINE W REFLEX MICROSCOPIC
Bilirubin Urine: NEGATIVE
Nitrite: NEGATIVE
Specific Gravity, Urine: >1.03 — ABNORMAL HIGH
Urobilinogen, UA: 0.2
pH: 5

## 2011-08-26 LAB — URINE CULTURE

## 2011-08-26 LAB — MAGNESIUM: Magnesium: 1.9

## 2011-08-26 LAB — URINE MICROSCOPIC-ADD ON

## 2011-08-26 LAB — CULTURE, BLOOD (ROUTINE X 2): Report Status: 10122008

## 2011-08-26 LAB — PHOSPHORUS: Phosphorus: 2.9

## 2011-08-26 LAB — PROTIME-INR: INR: 1

## 2011-08-30 ENCOUNTER — Telehealth: Payer: Self-pay | Admitting: Radiology

## 2011-08-30 NOTE — Assessment & Plan Note (Signed)
Controlled, no change in medication  

## 2011-08-30 NOTE — Telephone Encounter (Signed)
I faxed a referral for this patient to Dr. Gerilyn Pilgrim for leg weakness.

## 2011-08-30 NOTE — Assessment & Plan Note (Signed)
Hyperlipidemia:Low fat diet discussed and encouraged.  Continue medication as before, fasting labs prior to next  visit

## 2011-08-30 NOTE — Assessment & Plan Note (Signed)
Progressing with increased fall risk. Safety discussed and emphasized

## 2011-08-30 NOTE — Assessment & Plan Note (Signed)
Unchanged, supplemental oxygen and neb treatments as before

## 2011-08-30 NOTE — Progress Notes (Signed)
  Subjective:    Patient ID: [name], female    DOB: 04/14/1941, 70 y.o.   MRN: [id number]  HPI The PT is here for follow up and re-evaluation of chronic medical conditions, medication management and review of any available recent lab and radiology data.  Preventive health is updated, specifically  Cancer screening and Immunization.   Questions or concerns regarding consultations or procedures which the PT has had in the interim are  addressed. The PT denies any adverse reactions to current medications since the last visit.  C/o increased instability with recent fall, following with orthopedics for this.     Review of Systems See HPI Denies recent fever or chills. Denies sinus pressure, nasal congestion, ear pain or sore throat. Denies chest congestion, productive cough or wheezing, has COPD and is oxygen dependent. Needs supplies. Denies chest pains, palpitations and leg swelling Denies abdominal pain, nausea, vomiting,diarrhea or constipation.   Denies dysuria, frequency, hesitancy or incontinence. Denies headaches, seizures, numbness, or tingling. Denies depression, anxiety or insomnia. Denies skin break down or rash.        Objective:   Physical Exam Patient alert and oriented and in no cardiopulmonary distress.Pt maintained on supplemental oxygen  HEENT: No facial asymmetry, EOMI, no sinus tenderness,  oropharynx pink and moist.  Neck supple no adenopathy.  Chest: Clear to auscultation bilaterally.Decreased air entry throughout  CVS: S1, S2 no murmurs, no S3.  ABD: Soft non tender. Bowel sounds normal.  Ext: No edema  [id number]:[id number]  ROM spine, shoulders, hips and knees.  Skin: Intact, no ulcerations or rash noted.  Psych: Good eye contact, normal affect. Memory intact not anxious or depressed appearing.  CNS: CN 2-12 intact, power, tone and sensation normal throughout.        Assessment & Plan:

## 2011-09-01 ENCOUNTER — Ambulatory Visit (HOSPITAL_COMMUNITY)
Admission: RE | Admit: 2011-09-01 | Discharge: 2011-09-01 | Disposition: A | Discharge status: Home / Self Care | Payer: Medicare PPO | Source: Ambulatory Visit | Attending: Orthopedic Surgery | Admitting: Orthopedic Surgery

## 2011-09-01 DIAGNOSIS — M545 Low back pain, unspecified: Secondary | ICD-10-CM | POA: Insufficient documentation

## 2011-09-01 DIAGNOSIS — M6281 Muscle weakness (generalized): Secondary | ICD-10-CM | POA: Insufficient documentation

## 2011-09-01 DIAGNOSIS — J4489 Other specified chronic obstructive pulmonary disease: Secondary | ICD-10-CM | POA: Insufficient documentation

## 2011-09-01 DIAGNOSIS — IMO0001 Reserved for inherently not codable concepts without codable children: Secondary | ICD-10-CM | POA: Insufficient documentation

## 2011-09-01 DIAGNOSIS — J449 Chronic obstructive pulmonary disease, unspecified: Secondary | ICD-10-CM | POA: Insufficient documentation

## 2011-09-01 DIAGNOSIS — I10 Essential (primary) hypertension: Secondary | ICD-10-CM | POA: Insufficient documentation

## 2011-09-01 DIAGNOSIS — Z9181 History of falling: Secondary | ICD-10-CM | POA: Insufficient documentation

## 2011-09-01 NOTE — Progress Notes (Signed)
Physical Therapy Evaluation  Patient Details  Name: [name] MRN: [id number] Date of Birth: 11-17-1940  Today's Date: 09/01/2011 Time: 4098-1191 Time Calculation (min): 43 min Visit#: 1  of 16   Re-eval: 10/01/11 Assessment Diagnosis: Radicular low back pain Charge:  Evaluation Past Medical History:  Past Medical History  Diagnosis Date  . Allergic rhinitis   . Anemia   . Anxiety   . Depression   . GERD (gastroesophageal reflux disease)   . Hypertension   . Low back pain   . Arthritis     abnormal gait   . OSA on CPAP   . Degenerative disc disease, lumbar     L5 nerve impingement   . 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 Arm fracture, left   . Onychomycosis   . Ankle fracture, right   . Hyperglycemia   . Mediastinal lymphadenopathy 1/9    resolving   . COPD (chronic obstructive pulmonary disease)     chronic CO2 retention, decreased DLCO   . Cystic acne     adult    Past Surgical History:  Past Surgical History  Procedure Date  . Abdominal hysterectomy 1976    secondary to bleeding   . Oophorectomy 1976  . Epidural steroids     Subjective Symptoms/Limitations Symptoms: The patient states that she has had back pain for several years and has been given several injections.  She states that the pain is on the left side of her back and radiates down her left leg if she sits or stand too long.  The patient states that she has been using a walker to walk with for two years now.  The pain has been getting progressively worse and it is at a point now that her left leg just buckles.  She has begun to fall and fell two times last week.  The patient is being referred to therapy to improve her strength , improve her safety , improve her functional ability and decrease her pain. Limitations: Sitting;Standing;Walking How long can you sit comfortably?: Able to sit for 15 minutes before she has increased pain.  After  30 minutes her left leg will go  numb. How long can you stand comfortably?: The patient states she will need to sit down after 15 minutes. How long can you walk comfortably?: The patient states that she is able to walk with her walker.  If she has her walker she can walk for 15 minutes.   Repetition: Increases Symptoms Special Tests: O2 dependent. Pain Assessment Currently in Pain?: No/denies (if she has been weight bearing pain increases to a 9) Pain Location: Back Pain Orientation: Left Pain Onset: More than a month ago Pain Frequency: Intermittent Pain Relieving Factors: sitting for short a short time, injections Effect of Pain on Daily Activities: increases  Precautions/Restrictions  Precautions Precautions: Fall  Prior Functioning  Home Living Lives With: Alone  Objective: RLE Strength Right Hip Flexion: 5/5 Right Hip Extension: 2/5 Right Hip ABduction: 2/5 Right Hip ADduction: 3-/5 Right Knee Flexion: 5/5 Right Knee Extension: 5/5 Right Ankle Dorsiflexion: 5/5 LLE Strength Left Hip Flexion: 3-/5 Left Hip Extension: 2-/5 Left Hip ABduction: 2/5 Left Hip ADduction: 2/5 Left Knee Flexion: 3-/5 Left Knee Extension: 3/5 Left Ankle Dorsiflexion: 3/5 Lumbar AROM Lumbar Flexion: decreasedc 50% Lumbar Extension: decreased 80% Lumbar - Right Side Bend: decreased 30% Lumbar - Left Side Bend: decreased 30% Lumbar - Right Rotation: decreased 30% Lumbar - Left Rotation: decreased 30%  Exercise/Treatments    Stability Bridge: 5 reps Bent Knee Raise: 5 reps Ab Set: 5 reps   Physical Therapy Assessment and Plan PT Assessment and Plan Clinical Impression Statement: Patient with increased pain, decreased strength, pain that has limited functional ability who will benefit from skilled PT to improve functional ability. Rehab Potential: Good Clinical Impairments Affecting Rehab Potential: decreased strength, decreased balance pain PT Frequency: Min 3X/week PT Duration: 6 weeks PT Treatment/Interventions:  Balance training;Therapeutic exercise;Functional mobility training;Patient/family education PT Plan: Pt to be seen 3x/wk.  Pt to work on [employer], Standing without UE support, functional squat, SL abduction, K-C and active hamstretch next treatment.  If pt does not improve may have trial of pelvic traction in the furture.    Goals PT Short Term Goals Time to Complete Short Term Goals: 3 weeks PT Short Term Goal 1: Pt pain to be no greater than an 8 PT Short Term Goal 2: Patient to be able to sit 30 min without leg numbness PT Short Term Goal 3: no falls PT Long Term Goals Time to Complete Long Term Goals:  (6 weeks) PT Long Term Goal 1: I advance HEP PT Long Term Goal 2: able to stand 20 minute to prepare a meal Long Term Goal 3: Able to sit 60 mninutes without pain or numbness Long Term Goal 4: Able to walk 30 min. with her walker PT Long Term Goal 5: Sit to stand within 3 seconds. Patient able to show controlled descent when going stand to sit  Problem List Patient Active Problem List  Diagnoses  . ONYCHOMYCOSIS  . HYPERLIPIDEMIA  . ANEMIA-NOS  . ANXIETY  . DEPRESSION  . OBSTRUCTIVE SLEEP APNEA  . HYPERTENSION  . ALLERGIC RHINITIS, SEASONAL  . ALLERGIC RHINITIS  . COPD  . DRY MOUTH  . GERD  . ACUTE CYSTITIS  . DERMATITIS  . CYSTIC ACNE  . ARTHRITIS  . DEGENERATIVE DISC DISEASE  . LOW BACK PAIN  . SCIATICA, LEFT  . DISORDER OF BONE AND CARTILAGE UNSPECIFIED  . DIZZINESS  . CAROTID BRUIT  . DYSPNEA  . NAUSEA  . HYPERGLYCEMIA  . HYPOXEMIA  . HIP PAIN, LEFT  . Muscle weakness (generalized)  . Falls frequently    PT - End of Session Activity Tolerance: Patient tolerated treatment well General Behavior During Session: [facility] for tasks performed Cognition: [facility] for tasks performed   RUSSELL,CINDY 09/01/2011, 10:09 AM  Physician Documentation Your signature is required to indicate approval of the treatment plan as stated above.  Please sign and either send  electronically or make a copy of this report for your files and return this physician signed original.   Please mark one 1.__approve of plan  2. ___approve of plan with the following conditions.   ______________________________                                                          _____________________ Physician Signature  Date  

## 2011-09-01 NOTE — Patient Instructions (Addendum)
HEP

## 2011-09-02 NOTE — Telephone Encounter (Signed)
I don't understand the message but Patient states she does have the medicine

## 2011-09-07 ENCOUNTER — Ambulatory Visit (HOSPITAL_COMMUNITY)
Admission: RE | Admit: 2011-09-07 | Discharge: 2011-09-07 | Disposition: A | Discharge status: Home / Self Care | Payer: Medicare PPO | Source: Ambulatory Visit | Attending: Family Medicine | Admitting: Family Medicine

## 2011-09-07 DIAGNOSIS — R296 Repeated falls: Secondary | ICD-10-CM

## 2011-09-07 DIAGNOSIS — M6281 Muscle weakness (generalized): Secondary | ICD-10-CM

## 2011-09-07 NOTE — Progress Notes (Signed)
Physical Therapy Treatment Patient Details  Name: [name] MRN: [id number] Date of Birth: 11/26/1940  Today's Date: 09/07/2011 Time: 7829-5621 Time Calculation (min): 42 min Visit#: 2  of 16   Re-eval: 10/01/11   Charge:  There ex 43 Subjective: Symptoms/Limitations Symptoms: The patient states she is sore she is doing the exercises every night.   Pain Assessment Currently in Pain?: No/denies (pt has taken a pain pill) Pain Location: Back        Exercise/Treatments Stretches Active Hamstring Stretch: 3 reps;30 seconds Passive Hamstring Stretch: Limitations Passive Hamstring Stretch Limitations: stand without UE  Single Knee to Chest Stretch: 3 reps;30 seconds Lumbar Exercises   Stability Bridge: 15 reps Bent Knee Raise: 15 reps Ab Set: 15 reps Straight Leg Raise: 5 reps;Supine Hip Abduction: Side-lying;10 reps Functional Squats: 10 reps Heel Raises: 10 reps Machine Exercises Stationary Bike: 6' seat at 14 at 2.0     Physical Therapy Assessment and Plan PT Assessment and Plan Clinical Impression Statement: Pt needed verbal and tactile cues to complete stabilizaation exercises correctly.  Only able to hold standing without UE for 5 seconds. Rehab Potential: Good Clinical Impairments Affecting Rehab Potential: decreased strength and balance. PT Plan: begin hip flexion isometrics and T-Band exercises for strengthening.  May need assist standing secondary to balance.    Goals  decrease pain improved functional ability  Problem List Patient Active Problem List  Diagnoses  . ONYCHOMYCOSIS  . HYPERLIPIDEMIA  . ANEMIA-NOS  . ANXIETY  . DEPRESSION  . OBSTRUCTIVE SLEEP APNEA  . HYPERTENSION  . ALLERGIC RHINITIS, SEASONAL  . ALLERGIC RHINITIS  . COPD  . DRY MOUTH  . GERD  . ACUTE CYSTITIS  . DERMATITIS  . CYSTIC ACNE  . ARTHRITIS  . DEGENERATIVE DISC DISEASE  . LOW BACK PAIN  . SCIATICA, LEFT  . DISORDER OF BONE AND CARTILAGE UNSPECIFIED  .  DIZZINESS  . CAROTID BRUIT  . DYSPNEA  . NAUSEA  . HYPERGLYCEMIA  . HYPOXEMIA  . HIP PAIN, LEFT  . Muscle weakness (generalized)  . Falls frequently    PT - End of Session Activity Tolerance: Patient tolerated treatment well General Behavior During Session: [facility] for tasks performed Cognition: [facility] for tasks performed  RUSSELL,CINDY 09/07/2011, 8:46 AM

## 2011-09-08 ENCOUNTER — Ambulatory Visit (HOSPITAL_COMMUNITY)
Admission: RE | Admit: 2011-09-08 | Discharge: 2011-09-08 | Disposition: A | Discharge status: Home / Self Care | Payer: Medicare PPO | Source: Ambulatory Visit | Attending: Family Medicine | Admitting: Family Medicine

## 2011-09-08 NOTE — Progress Notes (Signed)
Physical Therapy Treatment Patient Details  Name: [name] MRN: [id number] Date of Birth: August 11, 1941  Today's Date: 09/08/2011 Time: 5621-3086 Time Calculation (min): 39 min Visit#: 3  of 16   Re-eval: 10/01/11 Charges: Therex x 38'  Subjective: Symptoms/Limitations Symptoms: Pt reports she is not having any pain currently but she took a pain pill when she woke up. Pain Assessment Currently in Pain?: No/denies    Exercise/Treatments Stretches Active Hamstring Stretch: 3 reps;30 seconds Single Knee to Chest Stretch: 3 reps;30 seconds Lumbar Exercises Scapular Retraction: 10 reps;Theraband Theraband Level (Scapular Retraction): Level 3 (Green) Row: 10 reps;Theraband Theraband Level (Row): Level 3 (Green) Shoulder Extension: 10 reps;Theraband Theraband Level (Shoulder Extension): Level 3 (Green) Stability Bridge: 15 reps Bent Knee Raise: 15 reps Ab Set: 15 reps Isometric Hip Flexion: 10 reps Straight Leg Raise: 5 reps;Supine  Physical Therapy Assessment and Plan PT Assessment and Plan Clinical Impression Statement: Pt displays hip instability with bridges. Began scapular tband ex with min assist to hold balance. Pt requires multimodal cueing to increase control with lowering of SLR. PT Plan: Continue to progress per PT POC.     Problem List Patient Active Problem List  Diagnoses  . ONYCHOMYCOSIS  . HYPERLIPIDEMIA  . ANEMIA-NOS  . ANXIETY  . DEPRESSION  . OBSTRUCTIVE SLEEP APNEA  . HYPERTENSION  . ALLERGIC RHINITIS, SEASONAL  . ALLERGIC RHINITIS  . COPD  . DRY MOUTH  . GERD  . ACUTE CYSTITIS  . DERMATITIS  . CYSTIC ACNE  . ARTHRITIS  . DEGENERATIVE DISC DISEASE  . LOW BACK PAIN  . SCIATICA, LEFT  . DISORDER OF BONE AND CARTILAGE UNSPECIFIED  . DIZZINESS  . CAROTID BRUIT  . DYSPNEA  . NAUSEA  . HYPERGLYCEMIA  . HYPOXEMIA  . HIP PAIN, LEFT  . Muscle weakness (generalized)  . Falls frequently    PT - End of Session Activity Tolerance:  Patient tolerated treatment well General Behavior During Session: [facility] for tasks performed Cognition: [facility] for tasks performed  Antonieta Iba 09/08/2011, 8:59 AM

## 2011-09-16 ENCOUNTER — Ambulatory Visit (HOSPITAL_COMMUNITY)
Admission: RE | Admit: 2011-09-16 | Discharge: 2011-09-16 | Disposition: A | Discharge status: Home / Self Care | Payer: Medicare PPO | Source: Ambulatory Visit | Attending: Family Medicine | Admitting: Family Medicine

## 2011-09-16 ENCOUNTER — Telehealth (HOSPITAL_COMMUNITY): Payer: Self-pay | Admitting: Physical Therapy

## 2011-09-16 DIAGNOSIS — R296 Repeated falls: Secondary | ICD-10-CM

## 2011-09-16 DIAGNOSIS — M545 Low back pain, unspecified: Secondary | ICD-10-CM | POA: Insufficient documentation

## 2011-09-16 DIAGNOSIS — J449 Chronic obstructive pulmonary disease, unspecified: Secondary | ICD-10-CM | POA: Insufficient documentation

## 2011-09-16 DIAGNOSIS — J4489 Other specified chronic obstructive pulmonary disease: Secondary | ICD-10-CM | POA: Insufficient documentation

## 2011-09-16 DIAGNOSIS — I10 Essential (primary) hypertension: Secondary | ICD-10-CM | POA: Insufficient documentation

## 2011-09-16 DIAGNOSIS — IMO0001 Reserved for inherently not codable concepts without codable children: Secondary | ICD-10-CM | POA: Insufficient documentation

## 2011-09-16 DIAGNOSIS — M6281 Muscle weakness (generalized): Secondary | ICD-10-CM | POA: Insufficient documentation

## 2011-09-16 NOTE — Progress Notes (Signed)
Physical Therapy Treatment Patient Details  Name: [name] MRN: [id number] Date of Birth: 20-Jul-1941  Today's Date: 09/16/2011 Time: 4034-7425 Time Calculation (min): 48 min Visit#: 4  of 16   Re-eval: 10/01/11 Charges:  therex 45'    Subjective: Symptoms/Limitations Symptoms: Pt. states she is having 5/10 pain this morning.  States it is worse in the morning and she has difficulty walking when first gets up. Pain Assessment Currently in Pain?: Yes Pain Score:   5 Pain Location: Back   Exercise/Treatments Lumbar Exercises Scapular Retraction: 10 reps;Theraband Theraband Level (Scapular Retraction): Level 3 (Green) Row: 10 reps;Theraband Theraband Level (Row): Level 3 (Green) Shoulder Extension: 10 reps;Theraband Theraband Level (Shoulder Extension): Level 3 (Green) Stability Bridge: 15 reps Bent Knee Raise: 15 reps Ab Set: 15 reps;5 seconds Isometric Hip Flexion: 10 reps;5 seconds Straight Leg Raise: 10 reps;Supine Leg Raise: Limitations Leg Raises Limitations: seated LAQ's 15 reps each Functional Squats: Limitations (TIME) Functional Squats Limitations: TIME Heel Raises: Limitations Heel Raises Limitations: TIME Machine Exercises Stationary Bike: TIME     Physical Therapy Assessment and Plan PT Assessment and Plan Clinical Impression Statement: Improved control with lowering LE's with SLR; required min assistance with tband exercises due to multiple LOB.  Began seated LAQ and Clams to address LE strengthening and core stability.  Pt. with difficulty coming from sit to stand. PT Treatment/Interventions: Therapeutic exercise PT Plan: Begin sit to stand exercises next visit.    Problem List Patient Active Problem List  Diagnoses  . ONYCHOMYCOSIS  . HYPERLIPIDEMIA  . ANEMIA-NOS  . ANXIETY  . DEPRESSION  . OBSTRUCTIVE SLEEP APNEA  . HYPERTENSION  . ALLERGIC RHINITIS, SEASONAL  . ALLERGIC RHINITIS  . COPD  . DRY MOUTH  . GERD  . ACUTE CYSTITIS    . DERMATITIS  . CYSTIC ACNE  . ARTHRITIS  . DEGENERATIVE DISC DISEASE  . LOW BACK PAIN  . SCIATICA, LEFT  . DISORDER OF BONE AND CARTILAGE UNSPECIFIED  . DIZZINESS  . CAROTID BRUIT  . DYSPNEA  . NAUSEA  . HYPERGLYCEMIA  . HYPOXEMIA  . HIP PAIN, LEFT  . Muscle weakness (generalized)  . Falls frequently    PT - End of Session Activity Tolerance: Patient tolerated treatment well General Behavior During Session: [facility] for tasks performed Cognition: [facility] for tasks performed  Emeline Gins B 09/16/2011, 11:37 AM

## 2011-09-17 ENCOUNTER — Ambulatory Visit (HOSPITAL_COMMUNITY)
Admission: RE | Admit: 2011-09-17 | Discharge: 2011-09-17 | Disposition: A | Discharge status: Home / Self Care | Payer: Medicare PPO | Source: Ambulatory Visit | Attending: Family Medicine | Admitting: Family Medicine

## 2011-09-17 DIAGNOSIS — R296 Repeated falls: Secondary | ICD-10-CM

## 2011-09-17 DIAGNOSIS — M6281 Muscle weakness (generalized): Secondary | ICD-10-CM

## 2011-09-17 NOTE — Progress Notes (Signed)
Physical Therapy Treatment Patient Details  Name: [name] MRN: [id number] Date of Birth: [birth year]  Today's Date: 09/17/2011 Time: 4098-1191 Time Calculation (min): 44 min Visit#: 5  of 16   Re-eval: 10/01/11  There ex 17'; Neuromuscular reed 24'  Subjective: Symptoms/Limitations Symptoms: Pt states that she has been trying to do some of the exercises at night but when she wakes up she is in the greatest pain. Pain Assessment Currently in Pain?: No/denies (Pt states she has taken pain meds this am)  Precautions/Restrictions -falls      Exercise/Treatments Stretches  standing no UE support x 2 min. Standing  hip extension x 10 Worked  on heel toe gt pt tends to walk on the ball of her feet increasing her instability.  SLS with two finger hold x 5 on both R/L LELumbar Exercises  Stability Bridge: 10 reps Straight Leg Raise: 10 reps Hip Abduction: Side-lying;10 reps Machine Exercises : sit to stand with mat at 24" 5 x R; 5x L  LE in back Stationary Bike: 6'@3 .0 seat at 14   Physical Therapy Assessment and Plan PT Assessment and Plan Clinical Impression Statement: Worked on standing stability activites today. PT Plan: try prone exercises next treatment pt may not be able to tolerate.    Goals  decrease back pain improve stability  Problem List Patient Active Problem List  Diagnoses  . ONYCHOMYCOSIS  . HYPERLIPIDEMIA  . ANEMIA-NOS  . ANXIETY  . DEPRESSION  . OBSTRUCTIVE SLEEP APNEA  . HYPERTENSION  . ALLERGIC RHINITIS, SEASONAL  . ALLERGIC RHINITIS  . COPD  . DRY MOUTH  . GERD  . ACUTE CYSTITIS  . DERMATITIS  . CYSTIC ACNE  . ARTHRITIS  . DEGENERATIVE DISC DISEASE  . LOW BACK PAIN  . SCIATICA, LEFT  . DISORDER OF BONE AND CARTILAGE UNSPECIFIED  . DIZZINESS  . CAROTID BRUIT  . DYSPNEA  . NAUSEA  . HYPERGLYCEMIA  . HYPOXEMIA  . HIP PAIN, LEFT  . Muscle weakness (generalized)  . Falls frequently    PT - End of Session Equipment Utilized  During Treatment: Gait belt Activity Tolerance: Patient tolerated treatment well General Behavior During Session: [facility] for tasks performed  RUSSELL,CINDY 09/17/2011, 8:49 AM

## 2011-09-21 ENCOUNTER — Ambulatory Visit (HOSPITAL_COMMUNITY)
Admission: RE | Admit: 2011-09-21 | Discharge: 2011-09-21 | Disposition: A | Discharge status: Home / Self Care | Payer: Medicare PPO | Source: Ambulatory Visit | Attending: Family Medicine | Admitting: Family Medicine

## 2011-09-21 NOTE — Progress Notes (Signed)
Physical Therapy Treatment Patient Details  Name: [name] MRN: [id number] Date of Birth: 1941-05-31  Today's Date: 09/21/2011 Time: 4098-1191 Time Calculation (min): 52 min Visit#: 6  of 16   Re-eval: 10/01/11 Charges:  therex 16', NMR 32'    Subjective: Symptoms/Limitations Symptoms: Pt. states she has no pain today secondary to taking pain meds.  However, pt. reports she hurt so bad this morning, she couldn't get out of the bed to walk. Pain Assessment Currently in Pain?: No/denies   Exercise/Treatments Stretches Standing Side Bend Limitations: standing no UE support x 2 min. Standing Extension Limitations: hip extension x 10 Quadruped Mid Back Stretch Limitations: work on heel toe gt. Quad Stretch Limitations: SLS with two finger hold x 1 min. each Lumbar Exercises Scapular Retraction: 15 reps;Seated;Theraband Theraband Level (Scapular Retraction): Level 3 (Green) Stability Bridge: 15 reps Straight Leg Raise: 15 reps Hip Abduction: Side-lying;15 reps Leg Raise: 5 reps;Prone;Right;Left Machine Exercises Cybex Leg Press: sit to stand with mat at 24" 5 x R; 5x L  LE in back     Physical Therapy Assessment and Plan PT Assessment and Plan Clinical Impression Statement: Pt. able to SLS for 1 minute on each leg with B UE support.  Pt. with several LOB with activities but able to correct with min assistance.  Changed standing theraband retraction to seated due to decreased trunk stability.  Pt. required multimodal cueing for posture and control with therex. Progressed to prone with manual cues. PT Treatment/Interventions: Therapeutic exercise;Balance training;Functional mobility training PT Plan: Continue to progress strength and stability.       Problem List Patient Active Problem List  Diagnoses  . ONYCHOMYCOSIS  . HYPERLIPIDEMIA  . ANEMIA-NOS  . ANXIETY  . DEPRESSION  . OBSTRUCTIVE SLEEP APNEA  . HYPERTENSION  . ALLERGIC RHINITIS, SEASONAL  . ALLERGIC  RHINITIS  . COPD  . DRY MOUTH  . GERD  . ACUTE CYSTITIS  . DERMATITIS  . CYSTIC ACNE  . ARTHRITIS  . DEGENERATIVE DISC DISEASE  . LOW BACK PAIN  . SCIATICA, LEFT  . DISORDER OF BONE AND CARTILAGE UNSPECIFIED  . DIZZINESS  . CAROTID BRUIT  . DYSPNEA  . NAUSEA  . HYPERGLYCEMIA  . HYPOXEMIA  . HIP PAIN, LEFT  . Muscle weakness (generalized)  . Falls frequently    PT - End of Session Activity Tolerance: Patient tolerated treatment well General Behavior During Session: [facility] for tasks performed Cognition: [facility] for tasks performed  Bascom Levels, Amy B 09/21/2011, 10:20 AM

## 2011-09-22 ENCOUNTER — Ambulatory Visit (HOSPITAL_COMMUNITY)
Admission: RE | Admit: 2011-09-22 | Discharge: 2011-09-22 | Disposition: A | Discharge status: Home / Self Care | Payer: Medicare PPO | Source: Ambulatory Visit | Attending: Family Medicine | Admitting: Family Medicine

## 2011-09-22 NOTE — Progress Notes (Signed)
Physical Therapy Treatment Patient Details  Name: [name] MRN: [id number] Date of Birth: [birth year]  Today's Date: 09/22/2011 Time: 7829-5621 Time Calculation (min): 47 min Visit#: 7  of 16   Re-eval: 10/01/11 Charges: Neuro re-ed x 10' Therex x 25' Gait x 8'  Subjective: Symptoms/Limitations Symptoms: Pt reprots taht she is pain free. She also reprots taht she felt good after last session. Pain Assessment Currently in Pain?: No/denies Pain Score: 0-No pain   Exercise/Treatments Standing standing no UE support x 2 min. hip extension x 15  work on heel toe gt. SLS with one finger hold x 1 min. each Lumbar Exercises Scapular Retraction: 15 reps;Seated;Theraband Theraband Level (Scapular Retraction): Level 3 (Green) Stability Bridge: 15 reps Straight Leg Raise: 15 reps sit to stand with mat at 22" 5 x R; 5x L  LE in back  Physical Therapy Assessment and Plan PT Assessment and Plan Clinical Impression Statement: Pt able to complete SLS for 1 minute with 1 finger assistance. Pt requires multimodal cueing with hip ext to isolate hip movement from trunk. Pt with 1 LOB with gt requiring mod assist to recover. Pt requires multimodal cueing for posture and technique with sit to stand.  PT Treatment/Interventions: Therapeutic exercise;Balance training;Gait training PT Plan: Continue per PT POC.     Problem List Patient Active Problem List  Diagnoses  . ONYCHOMYCOSIS  . HYPERLIPIDEMIA  . ANEMIA-NOS  . ANXIETY  . DEPRESSION  . OBSTRUCTIVE SLEEP APNEA  . HYPERTENSION  . ALLERGIC RHINITIS, SEASONAL  . ALLERGIC RHINITIS  . COPD  . DRY MOUTH  . GERD  . ACUTE CYSTITIS  . DERMATITIS  . CYSTIC ACNE  . ARTHRITIS  . DEGENERATIVE DISC DISEASE  . LOW BACK PAIN  . SCIATICA, LEFT  . DISORDER OF BONE AND CARTILAGE UNSPECIFIED  . DIZZINESS  . CAROTID BRUIT  . DYSPNEA  . NAUSEA  . HYPERGLYCEMIA  . HYPOXEMIA  . HIP PAIN, LEFT  . Muscle weakness (generalized)  .  Falls frequently    PT - End of Session Activity Tolerance: Patient tolerated treatment well General Behavior During Session: [facility] for tasks performed Cognition: [facility] for tasks performed  Antonieta Iba 09/22/2011, 9:00 AM

## 2011-09-29 ENCOUNTER — Encounter: Payer: Self-pay | Admitting: Orthopedic Surgery

## 2011-09-29 ENCOUNTER — Ambulatory Visit (INDEPENDENT_AMBULATORY_CARE_PROVIDER_SITE_OTHER): Payer: Medicare PPO | Admitting: Orthopedic Surgery

## 2011-09-29 VITALS — BP 140/90 | Ht 73.0 in | Wt 240.0 lb

## 2011-09-29 DIAGNOSIS — M4800 Spinal stenosis, site unspecified: Secondary | ICD-10-CM | POA: Insufficient documentation

## 2011-09-29 NOTE — Patient Instructions (Signed)
Continue naprosyn and finish therapy

## 2011-09-29 NOTE — Progress Notes (Signed)
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 Correlation for symptoms in left L5 distribution recommended.  Bulging discs causing mass-effect upon thecal sac and probably mild mass-effect  upon the L4 and L3 roots respectively at L3-L4 and L-2 3.  No additional disc herniation.  Multilevel degenerative disc and facet disease changes.  She has improved with therapy and is on naprosyn doing well   Continue medication and finish PT

## 2011-09-30 ENCOUNTER — Ambulatory Visit (HOSPITAL_COMMUNITY)
Admission: RE | Admit: 2011-09-30 | Discharge: 2011-09-30 | Disposition: A | Discharge status: Home / Self Care | Payer: Medicare PPO | Source: Ambulatory Visit | Attending: Family Medicine | Admitting: Family Medicine

## 2011-09-30 NOTE — Progress Notes (Addendum)
Physical Therapy Treatment Patient Details  Name: [name] MRN: [id number] Date of Birth: September 20, 1941  Today's Date: 09/30/2011 Time: 4098-1191 Time Calculation (min): 46 min Visit#: 8  of 16   Re-eval: 10/01/11 Charges: Therex x 34' Gait x 8'  Subjective: Symptoms/Limitations Symptoms: Pt reports that she has fallen twice since she was last at therapy. She reports both times she wasin her yard getting up leaves. Pain Assessment Currently in Pain?: No/denies Pain Score: 0-No pain (Pt took pain pill this morning)   Mobility (including Balance) Ambulation/Gait Ambulation/Gait: Yes Ambulation/Gait Assistance: 6: Modified independent (Device/Increase time) Ambulation Distance (Feet): 154 Feet Assistive device: 4-wheeled walker Gait Pattern: Decreased hip/knee flexion - left;Decreased hip/knee flexion - right;Decreased dorsiflexion - right;Decreased dorsiflexion - left;Decreased step length - right;Decreased step length - left Gait velocity: cautious     Exercise/Treatments Stretches Standing Side Bend Limitations: standing no UE support x 2 min. Standing Extension Limitations: hip extension x 15 Quad Stretch Limitations: SLS with one finger hold x 1 min. each Lumbar Exercises Scapular Retraction: 15 reps;Seated;Theraband Theraband Level (Scapular Retraction): Level 3 (Green) Stability Clam: Limitations Clam Limitations: supine ER/IRwith grn ball 5x5" each Bridge: 20 reps Straight Leg Raise: 15 reps Machine Exercises Cybex Leg Press: sit to stand with mat at 22" 5 x   Physical Therapy Assessment and Plan PT Assessment and Plan Clinical Impression Statement: Pt displays increased activity tolerance. Pt with improved posture and decreased assistance with sit to stand. Pt without LOB with gait this session.  PT Treatment/Interventions: Therapeutic exercise;Gait training PT Plan: Continue to progress per PT POC.     Problem List Patient Active Problem List    Diagnoses  . ONYCHOMYCOSIS  . HYPERLIPIDEMIA  . ANEMIA-NOS  . ANXIETY  . DEPRESSION  . OBSTRUCTIVE SLEEP APNEA  . HYPERTENSION  . ALLERGIC RHINITIS, SEASONAL  . ALLERGIC RHINITIS  . COPD  . DRY MOUTH  . GERD  . ACUTE CYSTITIS  . DERMATITIS  . CYSTIC ACNE  . ARTHRITIS  . DEGENERATIVE DISC DISEASE  . LOW BACK PAIN  . SCIATICA, LEFT  . DISORDER OF BONE AND CARTILAGE UNSPECIFIED  . DIZZINESS  . CAROTID BRUIT  . DYSPNEA  . NAUSEA  . HYPERGLYCEMIA  . HYPOXEMIA  . HIP PAIN, LEFT  . Muscle weakness (generalized)  . Falls frequently  . Spinal stenosis    PT - End of Session Activity Tolerance: Patient tolerated treatment well General Behavior During Session: [facility] for tasks performed Cognition: [facility] for tasks performed  Antonieta Iba 09/30/2011, 11:28 AM

## 2011-10-01 ENCOUNTER — Ambulatory Visit (HOSPITAL_COMMUNITY)
Admission: RE | Admit: 2011-10-01 | Discharge: 2011-10-01 | Disposition: A | Discharge status: Home / Self Care | Payer: Medicare PPO | Source: Ambulatory Visit | Attending: Family Medicine | Admitting: Family Medicine

## 2011-10-01 DIAGNOSIS — R296 Repeated falls: Secondary | ICD-10-CM

## 2011-10-01 DIAGNOSIS — M6281 Muscle weakness (generalized): Secondary | ICD-10-CM

## 2011-10-01 NOTE — Patient Instructions (Addendum)
HEP

## 2011-10-01 NOTE — Progress Notes (Signed)
Physical Therapy Reassessment  Patient Details  Name: [name] MRN: [id number] Date of Birth: [birth year]  Today's Date: 10/01/2011 Time: 6962-9528 Time Calculation (min): 45 min Visit#: 9  of 9   Re-eval:  today    Past Medical History:  Past Medical History  Diagnosis Date  . Allergic rhinitis   . Anemia   . Anxiety   . Depression   . GERD (gastroesophageal reflux disease)   . Hypertension   . Low back pain   . Arthritis     abnormal gait   . OSA on CPAP   . Degenerative disc disease, lumbar     L5 nerve impingement   . 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 Arm fracture, left   . Onychomycosis   . Ankle fracture, right   . Hyperglycemia   . Mediastinal lymphadenopathy 1/9    resolving   . COPD (chronic obstructive pulmonary disease)     chronic CO2 retention, decreased DLCO   . Cystic acne     adult    Past Surgical History:  Past Surgical History  Procedure Date  . Abdominal hysterectomy 1976    secondary to bleeding   . Oophorectomy 1976  . Epidural steroids     Subjective Symptoms/Limitations Symptoms: The patient states that she feels she is doing better.  She states if she stays up long she starts getting tired and weak.  Thepateint states thtat before she started therapy she was falling once a week.  She has only fallen once since starting therapy and this was because she was leaqning over holding onto a tree and the limb broke. How long can you sit comfortably?: The patient was only able to sit for 15 minutes before her back started hurting.  Now she can sit on the couch for an hour.  She states her leg will start going numb. How long can you stand comfortably?: The patient states that she can stand for about 20 min. now where she was able to be up 15 min. How long can you walk comfortably?: the patient states that she can walk with her walker 21:00 she was stating that she could only walk for 15 min.  Pain  Assessment Currently in Pain?: No/denies (The patient states that her leg pain is not bad any longer)  Precautions/Restrictions   falls  Prior Functioning   Pt falling once a week.   Objection RLE Strength Right Hip Flexion: 5/5 was 5/5 Right Hip Extension: 3/5 was 2/5 Right Hip ABduction: 5/5 was 2/5 Right Hip ADduction: 3/5 was 3-/5 Right Knee Flexion: 5/5  Right Knee Extension: 5/5 Right Ankle Dorsiflexion: 5/5 LLE Strength Left Hip Flexion: 5/5 was 3- Left Hip Extension: 3-/5 was 2- Left Hip ABduction: 3+/5 was 3 Left Hip ADduction: 3/5 was 2 Left Knee Flexion: 5/5 was 3- Left Knee Extension: 5/5 was 3 Left Ankle Dorsiflexion: 5/5 was 3 Lumbar AROM Lumbar Flexion:  (decreased 50%) Lumbar Extension: decreased 70 % Lumbar - Right Side Bend: decreased 305 Lumbar - Left Side Bend: decreased 30% Lumbar - Right Rotation: decreased 50% Lumbar - Left Rotation: decreased 50%  Balance:  Pt able to stand with supervision for one minute without and UE hold.  At initial evaluation pt was unable to stand for 15 sec with one hand hold.  Exercise/Treatments -for strengthening and balance. Physical Therapy Assessment and Plan PT Assessment and Plan Clinical Impression Statement: Pt improved significantly in strength;  Balance is improved  but is still a significant deficit.  Pt low back pain is improved.  ROM has not improved at this point.   Clinical Impairments Affecting Rehab Potential: balance, strength. PT Plan: D/C to [facility] secondary to transportation issues. PT encouraged to continue with therapy due to good gains stated she  Has to rely on her daugther to bring her and she would prefer to attempt to do at home.    Goals Home Exercise Program Pt will Perform Home Exercise Program: Independently PT Short Term Goals PT Short Term Goal 1 - Progress: Met PT Short Term Goal 2 - Progress: Met PT Short Term Goal 3: Pt fell one time last week but it had to do with pt leaning on a  tree. PT Short Term Goal 3 - Progress: Partly met PT Long Term Goals PT Long Term Goal 1 - Progress: Met PT Long Term Goal 2 - Progress: Met Long Term Goal 3 Progress: Partly met Long Term Goal 4 Progress: Progressing toward goal Long Term Goal 5 Progress: Met Problem List Patient Active Problem List  Diagnoses  . ONYCHOMYCOSIS  . HYPERLIPIDEMIA  . ANEMIA-NOS  . ANXIETY  . DEPRESSION  . OBSTRUCTIVE SLEEP APNEA  . HYPERTENSION  . ALLERGIC RHINITIS, SEASONAL  . ALLERGIC RHINITIS  . COPD  . DRY MOUTH  . GERD  . ACUTE CYSTITIS  . DERMATITIS  . CYSTIC ACNE  . ARTHRITIS  . DEGENERATIVE DISC DISEASE  . LOW BACK PAIN  . SCIATICA, LEFT  . DISORDER OF BONE AND CARTILAGE UNSPECIFIED  . DIZZINESS  . CAROTID BRUIT  . DYSPNEA  . NAUSEA  . HYPERGLYCEMIA  . HYPOXEMIA  . HIP PAIN, LEFT  . Muscle weakness (generalized)  . Falls frequently  . Spinal stenosis    PT - End of Session Activity Tolerance: Patient tolerated treatment well General Behavior During Session: [facility] for tasks performed Cognition: [facility] for tasks performed   RUSSELL,CINDY 10/01/2011, 9:58 AM  Physician Documentation Your signature is required to indicate approval of the treatment plan as stated above.  Please sign and either send electronically or make a copy of this report for your files and return this physician signed original.   Please mark one 1.__approve of plan  2. ___approve of plan with the following conditions.   ______________________________                                                          _____________________ Physician Signature                                                                                                             Date

## 2011-11-19 ENCOUNTER — Encounter: Payer: Self-pay | Admitting: Orthopedic Surgery

## 2011-12-08 ENCOUNTER — Telehealth: Payer: Self-pay | Admitting: Family Medicine

## 2011-12-08 DIAGNOSIS — K219 Gastro-esophageal reflux disease without esophagitis: Secondary | ICD-10-CM

## 2011-12-08 DIAGNOSIS — I10 Essential (primary) hypertension: Secondary | ICD-10-CM

## 2011-12-08 DIAGNOSIS — M25552 Pain in left hip: Secondary | ICD-10-CM

## 2011-12-08 DIAGNOSIS — F32A Depression, unspecified: Secondary | ICD-10-CM

## 2011-12-08 DIAGNOSIS — F329 Major depressive disorder, single episode, unspecified: Secondary | ICD-10-CM

## 2011-12-09 MED ORDER — FLUOXETINE HCL 40 MG PO CAPS: 40.0000 mg | ORAL_CAPSULE | Freq: Every day | ORAL | Status: DC

## 2011-12-09 MED ORDER — OMEPRAZOLE 40 MG PO CPDR: 40.0000 mg | DELAYED_RELEASE_CAPSULE | Freq: Every day | ORAL | Status: DC

## 2011-12-09 MED ORDER — NAPROXEN 500 MG PO TABS: 500.0000 mg | ORAL_TABLET | Freq: Two times a day (BID) | ORAL | Status: DC

## 2011-12-09 MED ORDER — VERAPAMIL HCL ER 240 MG PO TBCR: 240.0000 mg | EXTENDED_RELEASE_TABLET | Freq: Every day | ORAL | Status: DC

## 2011-12-09 NOTE — Telephone Encounter (Signed)
Faxed to [facility].

## 2011-12-18 LAB — BASIC METABOLIC PANEL
BUN: 19 mg/dL (ref 6–23)
CO2: 29 mEq/L (ref 19–32)
Chloride: 101 mEq/L (ref 96–112)
Creat: 0.92 mg/dL (ref 0.50–1.10)
Glucose, Bld: 89 mg/dL (ref 70–99)
Potassium: 4.5 mEq/L (ref 3.5–5.3)

## 2011-12-18 LAB — LIPID PANEL
Cholesterol: 247 mg/dL — ABNORMAL HIGH (ref 0–200)
VLDL: 25 mg/dL (ref 0–40)

## 2011-12-18 LAB — HEPATIC FUNCTION PANEL
ALT: 13 U/L (ref 0–35)
AST: 16 U/L (ref 0–37)
Albumin: 4.3 g/dL (ref 3.5–5.2)
Bilirubin, Direct: 0.1 mg/dL (ref 0.0–0.3)
Total Protein: 6.9 g/dL (ref 6.0–8.3)

## 2011-12-28 ENCOUNTER — Ambulatory Visit (INDEPENDENT_AMBULATORY_CARE_PROVIDER_SITE_OTHER): Payer: Medicare PPO | Admitting: Family Medicine

## 2011-12-28 ENCOUNTER — Encounter: Payer: Self-pay | Admitting: Family Medicine

## 2011-12-28 DIAGNOSIS — M545 Low back pain, unspecified: Secondary | ICD-10-CM

## 2011-12-28 DIAGNOSIS — M899 Disorder of bone, unspecified: Secondary | ICD-10-CM

## 2011-12-28 DIAGNOSIS — J449 Chronic obstructive pulmonary disease, unspecified: Secondary | ICD-10-CM

## 2011-12-28 DIAGNOSIS — R5381 Other malaise: Secondary | ICD-10-CM

## 2011-12-28 DIAGNOSIS — F329 Major depressive disorder, single episode, unspecified: Secondary | ICD-10-CM

## 2011-12-28 DIAGNOSIS — E785 Hyperlipidemia, unspecified: Secondary | ICD-10-CM

## 2011-12-28 DIAGNOSIS — F32A Depression, unspecified: Secondary | ICD-10-CM

## 2011-12-28 DIAGNOSIS — F3289 Other specified depressive episodes: Secondary | ICD-10-CM

## 2011-12-28 DIAGNOSIS — M25559 Pain in unspecified hip: Secondary | ICD-10-CM

## 2011-12-28 DIAGNOSIS — I10 Essential (primary) hypertension: Secondary | ICD-10-CM

## 2011-12-28 DIAGNOSIS — M25552 Pain in left hip: Secondary | ICD-10-CM

## 2011-12-28 DIAGNOSIS — Z1211 Encounter for screening for malignant neoplasm of colon: Secondary | ICD-10-CM

## 2011-12-28 DIAGNOSIS — J4489 Other specified chronic obstructive pulmonary disease: Secondary | ICD-10-CM

## 2011-12-28 DIAGNOSIS — M949 Disorder of cartilage, unspecified: Secondary | ICD-10-CM

## 2011-12-28 DIAGNOSIS — K219 Gastro-esophageal reflux disease without esophagitis: Secondary | ICD-10-CM

## 2011-12-28 DIAGNOSIS — D649 Anemia, unspecified: Secondary | ICD-10-CM

## 2011-12-28 MED ORDER — BENZONATATE 100 MG PO CAPS: ORAL_CAPSULE | ORAL | Status: DC

## 2011-12-28 NOTE — Patient Instructions (Addendum)
F/u in  September  You need to take your cholesterol pills every night, your cholesterol is too high, also cut back on fried and fatty foods.   I am glad that you are doing better.   You are referred for colonoscopy to drFields  In august  Your mammogram is due in March pls schedule   New med sent in for chest congestion  CBC, fasting cmp, lipid, TSH and vit D in September

## 2011-12-28 NOTE — Assessment & Plan Note (Signed)
Uncontrolled due to non compliance.

## 2011-12-29 NOTE — Progress Notes (Signed)
Pt referred by Dr. Lodema Hong for a colonoscopy.Pt wants August appt.

## 2012-01-01 NOTE — Assessment & Plan Note (Signed)
Controlled, no change in medication  

## 2012-01-01 NOTE — Assessment & Plan Note (Signed)
Reports improvement in symptoms.

## 2012-01-01 NOTE — Progress Notes (Signed)
  Subjective:    Patient ID: [name], female    DOB: 15-Jan-1941, 71 y.o.   MRN: [id number]  HPI The PT is here for follow up and re-evaluation of chronic medical conditions, medication management and review of any available recent lab and radiology data.  Preventive health is updated, specifically  Cancer screening and Immunization.   Questions or concerns regarding consultations or procedures which the PT has had in the interim are  addressed. The PT denies any adverse reactions to current medications since the last visit.  C/o increased nasal stuffiness and chest congestion in the past 2 weeks. Denies fever , chills or yellow drainage or sputum. Overall she feels better and reports improved mobility      Review of Systems See HPI Denies recent fever or chills. Denies sinus pressure, , ear pain or sore throat.  Denies chest pains, palpitations and leg swelling Denies abdominal pain, nausea, vomiting,diarrhea or constipation.   Denies dysuria, frequency, hesitancy or incontinence. Chronic generalized arthritis and ambulates with a walker Denies headaches, seizures, numbness, or tingling. Denies depression, anxiety or insomnia. Denies skin break down or rash.        Objective:   Physical Exam Patient alert and oriented and in no cardiopulmonary distress.Uses suplemental oxygen and a walker  HEENT: No facial asymmetry, EOMI, no sinus tenderness,  oropharynx pink and moist.  Neck supple no adenopathy.  Chest: Decreased air entry, scattered crackles and wheezes.  CVS: S1, S2 no murmurs, no S3.  ABD: Soft non tender. Bowel sounds normal.  Ext: No edema  MS: decreased  ROM spine, shoulders, hips and knees.  Skin: Intact, no ulcerations or rash noted.  Psych: Good eye contact, normal affect. Memory intact not anxious or depressed appearing.  CNS: CN 2-12 intact, power, normal throughout.        Assessment & Plan:

## 2012-01-01 NOTE — Assessment & Plan Note (Signed)
Markedly improved, continue meds 

## 2012-01-01 NOTE — Assessment & Plan Note (Signed)
increased respiratory secretions in recent times, will add tessalon perles as needed

## 2012-01-04 NOTE — Progress Notes (Signed)
Called pt. She said she has a lot going on now with her legs. She will discuss with her daughter and might consider a colonoscopy in the near future. Will let me know.

## 2012-01-10 ENCOUNTER — Other Ambulatory Visit: Payer: Self-pay | Admitting: Family Medicine

## 2012-01-10 DIAGNOSIS — Z139 Encounter for screening, unspecified: Secondary | ICD-10-CM

## 2012-01-16 ENCOUNTER — Emergency Department (HOSPITAL_COMMUNITY)
Admission: EM | Admit: 2012-01-16 | Discharge: 2012-01-16 | Disposition: A | Discharge status: Home / Self Care | Payer: Medicare PPO | Attending: Emergency Medicine | Admitting: Emergency Medicine

## 2012-01-16 ENCOUNTER — Encounter (HOSPITAL_COMMUNITY): Payer: Self-pay

## 2012-01-16 ENCOUNTER — Emergency Department (HOSPITAL_COMMUNITY): Payer: Medicare PPO

## 2012-01-16 DIAGNOSIS — J449 Chronic obstructive pulmonary disease, unspecified: Secondary | ICD-10-CM | POA: Insufficient documentation

## 2012-01-16 DIAGNOSIS — M25476 Effusion, unspecified foot: Secondary | ICD-10-CM | POA: Insufficient documentation

## 2012-01-16 DIAGNOSIS — M25473 Effusion, unspecified ankle: Secondary | ICD-10-CM | POA: Insufficient documentation

## 2012-01-16 DIAGNOSIS — G4733 Obstructive sleep apnea (adult) (pediatric): Secondary | ICD-10-CM | POA: Insufficient documentation

## 2012-01-16 DIAGNOSIS — Z79899 Other long term (current) drug therapy: Secondary | ICD-10-CM | POA: Insufficient documentation

## 2012-01-16 DIAGNOSIS — J4489 Other specified chronic obstructive pulmonary disease: Secondary | ICD-10-CM | POA: Insufficient documentation

## 2012-01-16 DIAGNOSIS — S82843A Displaced bimalleolar fracture of unspecified lower leg, initial encounter for closed fracture: Secondary | ICD-10-CM

## 2012-01-16 DIAGNOSIS — M129 Arthropathy, unspecified: Secondary | ICD-10-CM | POA: Insufficient documentation

## 2012-01-16 DIAGNOSIS — F341 Dysthymic disorder: Secondary | ICD-10-CM | POA: Insufficient documentation

## 2012-01-16 DIAGNOSIS — K219 Gastro-esophageal reflux disease without esophagitis: Secondary | ICD-10-CM | POA: Insufficient documentation

## 2012-01-16 DIAGNOSIS — I10 Essential (primary) hypertension: Secondary | ICD-10-CM | POA: Insufficient documentation

## 2012-01-16 DIAGNOSIS — W010XXA Fall on same level from slipping, tripping and stumbling without subsequent striking against object, initial encounter: Secondary | ICD-10-CM | POA: Insufficient documentation

## 2012-01-16 DIAGNOSIS — M25579 Pain in unspecified ankle and joints of unspecified foot: Secondary | ICD-10-CM | POA: Insufficient documentation

## 2012-01-16 MED ORDER — DOCUSATE SODIUM 100 MG PO CAPS: 100.0000 mg | ORAL_CAPSULE | Freq: Two times a day (BID) | ORAL | Status: AC

## 2012-01-16 MED ORDER — HYDROCODONE-ACETAMINOPHEN 5-325 MG PO TABS: ORAL_TABLET | ORAL | Status: DC

## 2012-01-16 MED ORDER — IBUPROFEN 400 MG PO TABS
400.0000 mg | ORAL_TABLET | Freq: Once | ORAL | Status: AC
  Administered 2012-01-16: 400 mg via ORAL
  Filled 2012-01-16: qty 1

## 2012-01-16 NOTE — ED Provider Notes (Addendum)
History     CSN: [id number]  Arrival date & time 01/16/12  1603   First MD Initiated Contact with Patient 01/16/12 1649      Chief Complaint  Patient presents with  . Joint Swelling    (Consider location/radiation/quality/duration/timing/severity/associated sxs/prior treatment) HPI Comments: Patient was wearing socks and on a hardwood floor when her left foot slid and she twisted her ankle and fell to the ground. She denies any pain from falling. She denies any head injury, neck pain, back pain. She denies shortness of breath worse than usual. She is on home oxygen. She reports she has pain diffusely with associated significant swelling around her left ankle. She denies pain to her foot or toes. She reports she was unable to stand up or bear weight on her left ankle. She denies pain to her leg or her knee or her hip. No medications were taken prior to arrival. She denies numbness or weakness. She's not on Coumadin or any other significant blood thinners.  The history is provided by the patient and a relative.    Past Medical History  Diagnosis Date  . Allergic rhinitis   . Anemia   . Anxiety   . Depression   . GERD (gastroesophageal reflux disease)   . Hypertension   . Low back pain   . Arthritis     abnormal gait   . OSA on CPAP   . Degenerative disc disease, lumbar     L5 nerve impingement   . 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 Arm fracture, left   . Onychomycosis   . Ankle fracture, right   . Hyperglycemia   . Mediastinal lymphadenopathy 1/9    resolving   . COPD (chronic obstructive pulmonary disease)     chronic CO2 retention, decreased DLCO   . Cystic acne     adult     Past Surgical History  Procedure Date  . Abdominal hysterectomy 1976    secondary to bleeding   . Oophorectomy 1976  . Epidural steroids     Family History  Problem Relation Age of Onset  . Stomach cancer Father   . Breast cancer Father   . Cancer Father   . Cancer  Sister   . Hypertension Son   . Cancer Mother   . Arthritis      History  Substance Use Topics  . Smoking status: Former Smoker    Quit date: 06/21/2006  . Smokeless tobacco: Never Used  . Alcohol Use: No    OB History    Grav Para Term Preterm Abortions TAB SAB Ect Mult Living   5 4 4  1  1          Review of Systems  HENT: Negative for neck pain.   Musculoskeletal: Positive for joint swelling and arthralgias. Negative for back pain.  Skin: Negative for wound.  Neurological: Negative for weakness, numbness and headaches.    Allergies  Ciprofloxacin; Pravastatin sodium; and Sulfonamide derivatives  Home Medications   Current Outpatient Rx  Name Route Sig Dispense Refill  . ALBUTEROL SULFATE HFA 108 (90 BASE) MCG/ACT IN AERS Inhalation Inhale 2 puffs into the lungs every 4 (four) hours as needed for wheezing. 1 Inhaler 0  . ARFORMOTEROL TARTRATE 15 MCG/2ML IN NEBU Nebulization Take 2 mLs (15 mcg total) by nebulization 2 (two) times daily. 360 mL 1  . BENZONATATE 100 MG PO CAPS  One capsule twice daily, as needed for chest congestion  60 capsule 0  . CALCIUM CARBONATE-VITAMIN D 500-200 MG-UNIT PO TABS Oral Take 1 tablet by mouth 2 (two) times daily.      [name] DOCUSATE SODIUM 100 MG PO CAPS Oral Take 1 capsule (100 mg total) by mouth 2 (two) times daily. 30 capsule 0  . FLUOXETINE HCL 40 MG PO CAPS Oral Take 1 capsule (40 mg total) by mouth daily. 90 capsule 1  . HYDROCODONE-ACETAMINOPHEN 5-325 MG PO TABS  1-2 tablets po q 6 hours prn moderate to severe pain 20 tablet 0  . LOVASTATIN 40 MG PO TABS Oral Take 40 mg by mouth at bedtime.      . CEROVITE SENIOR PO TABS Oral Take by mouth daily.      [name] NAPROXEN 500 MG PO TABS Oral Take 1 tablet (500 mg total) by mouth 2 (two) times daily with a meal. 180 tablet 1  . OMEPRAZOLE 40 MG PO CPDR Oral Take 1 capsule (40 mg total) by mouth daily. 90 capsule 1  . VERAPAMIL HCL ER 240 MG PO TBCR Oral Take 1 tablet (240 mg total) by mouth at  bedtime. 90 tablet 1  . VITAMIN C 500 MG PO TABS Oral Take 1,000 mg by mouth daily.        BP 142/98  Pulse 101  Temp(Src) 97.5 F (36.4 C) (Oral)  Resp 20  Ht 6\' 1"  (1.854 m)  Wt 240 lb (108.863 kg)  BMI 31.66 kg/m2  SpO2 96%  Physical Exam  Nursing note and vitals reviewed. Constitutional: She appears well-developed and well-nourished. No distress.  HENT:  Head: Normocephalic and atraumatic.  Neck: Normal range of motion. Neck supple.  Pulmonary/Chest: Effort normal.  Abdominal: Soft.  Musculoskeletal:       Feet:  Neurological: She is alert. She has normal strength. No sensory deficit.  Skin: Skin is warm and dry. She is not diaphoretic.       No abrasions, lacerations, or obvious bruising  Psychiatric: She has a normal mood and affect. Her speech is normal. Judgment normal.    ED Course  Procedures (including critical care time)  Labs Reviewed - No data to display Dg Ankle Complete Left  01/16/2012  *RADIOLOGY REPORT*  Clinical Data: Status post fall.  Ankle pain and swelling.  LEFT ANKLE COMPLETE - 3+ VIEW  Comparison: None.  Findings: There is an acute fracture of the medial malleolus which is mildly displaced laterally.  There is an oblique fracture of the distal fibula extending proximally from the ankle mortise.  This demonstrates up to 9 mm of lateral displacement with resulting widening of the ankle mortise.  The posterior malleolus and talar dome appear intact.  There is moderate soft tissue swelling surrounding the ankle with a small ankle joint effusion.  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  IMPRESSION: Mildly displaced bimalleolar fracture of the left ankle as described.  Original Report Authenticated By: Gerrianne Scale, M.D.     1. Ankle fracture, bimalleolar, closed       MDM   RICE for current injury.  Will get plain film.  Pt denies h/o kidney problems, ulcers,  family reports ok taking ibuprofen.       6:00 PM  I reviewed the film myself.  B ankle fracture with mild displacement.  Per radiologist about 9 mm lateral displacement.  Neurovascularly intact.  Will splint, provide crutches and will refer to ortho.    Gavin Pound. Aviance [place]wood,  MD 01/16/12 1759  Gavin Pound. Shakirra Buehler, MD 01/16/12 1800   6:47 PM Spoke to Dr. Romeo Apple who can see pt in his office this week  Gavin Pound. Oletta Lamas, MD 01/16/12 4098

## 2012-01-16 NOTE — ED Notes (Addendum)
Pt presents with swollen left ankle after slipping and falling today. Pt denies hitting head.

## 2012-01-16 NOTE — Discharge Instructions (Signed)
Ankle Fracture A fracture is a break in the bone. A cast or splint is used to protect and keep your injured bone from moving.  HOME CARE INSTRUCTIONS   Use your crutches as directed.   To lessen the swelling, keep the injured leg elevated while sitting or lying down.   Apply ice to the injury for 15 to 20 minutes, 3 to 4 times per day while awake for 2 days. Put the ice in a plastic bag and place a thin towel between the bag of ice and your cast.   If you have a plaster or fiberglass cast:   Do not try to scratch the skin under the cast using sharp or pointed objects.   Check the skin around the cast every day. You may put lotion on any red or sore areas.   Keep your cast dry and clean.   If you have a plaster splint:   Wear the splint as directed.   You may loosen the elastic around the splint if your toes become numb, tingle, or turn cold or blue.   Do not put pressure on any part of your cast or splint; it may break. Rest your cast only on a pillow the first 24 hours until it is fully hardened.   Your cast or splint can be protected during bathing with a plastic bag. Do not lower the cast or splint into water.   Take medications as directed by your caregiver. Only take over-the-counter or prescription medicines for pain, discomfort, or fever as directed by your caregiver.   Do not drive a vehicle until your caregiver specifically tells you it is safe to do so.   If your caregiver has given you a follow-up appointment, it is very important to keep that appointment. Not keeping the appointment could result in a chronic or permanent injury, pain, and disability. If there is any problem keeping the appointment, you must call back to this facility for assistance.  SEEK IMMEDIATE MEDICAL CARE IF:   Your cast gets damaged or breaks.   You have continued severe pain or more swelling than you did before the cast was put on.   Your skin or toenails below the injury turn blue or gray,  or feel cold or numb.   There is a bad smell or new stains and/or purulent (pus like) drainage coming from under the cast.  If you do not have a window in your cast for observing the wound, a discharge or minor bleeding may show up as a stain on the outside of your cast. Report these findings to your caregiver. MAKE SURE YOU:   Understand these instructions.   Will watch your condition.   Will get help right away if you are not doing well or get worse.  Document Released: 10/29/2000 Document Revised: 10/21/2011 Document Reviewed: 06/04/2008 [facility] Patient Information 2012 [place], [place].    Narcotic and benzodiazepine use may cause drowsiness, slowed breathing or dependence.  Please use with caution and do not drive, operate machinery or watch young children alone while taking them.  Taking combinations of these medications or drinking alcohol will potentiate these effects.  I suggest that you take stool softener like colace with lots of water when taking Norco to prevent constipation.

## 2012-01-16 NOTE — ED Notes (Signed)
Splint applied with assistance of Teodoro Kil., RN and Wallis Bamberg, [place]. + [facility] post application.

## 2012-01-17 ENCOUNTER — Encounter: Payer: Self-pay | Admitting: Orthopedic Surgery

## 2012-01-17 ENCOUNTER — Telehealth: Payer: Self-pay | Admitting: Orthopedic Surgery

## 2012-01-17 ENCOUNTER — Ambulatory Visit (INDEPENDENT_AMBULATORY_CARE_PROVIDER_SITE_OTHER): Payer: Medicare PPO | Admitting: Orthopedic Surgery

## 2012-01-17 VITALS — BP 100/60 | Ht 73.0 in | Wt 240.0 lb

## 2012-01-17 DIAGNOSIS — S82843A Displaced bimalleolar fracture of unspecified lower leg, initial encounter for closed fracture: Secondary | ICD-10-CM

## 2012-01-17 NOTE — Telephone Encounter (Signed)
Contacted [employer] at ph [phone], re: out-patient surgery scheduled 01/18/12, at [facility], for CPT [phone], ICD9code 824.4, bimalleolar fracture repair, ORIF.  Per automated voice response system, no pre-authorization is required for this procedure.  Today's date (01/17/12) at 5:05PM for reference.

## 2012-01-17 NOTE — Progress Notes (Signed)
  Subjective:    [name] is a 70 y.o. female who presents with LEFT ankle bimalleolar fracture sustained on January 16, 2012  The patient has a history of COPD and is on oxygen therapy and noncompliant with her CPAP machine.  She slipped at home on a wooden floor with socks on presented to the emergency room on the third and x-rays show bimalleolar fracture with displacement.  She complains of 6/10 throbbing pain with bruising swelling stiffness and some numbness in the foot which is intermittent.  She is a history of shortness of breath wheezing snoring COPD neurologic numbness and unsteady gait with tremors she uses a walker with a seat for ambulation.  She has a history of depression and nervousness she bruises easily and she has seasonal ALLERGIES  The remaining systems were reviewed and were normal.  The following portions of the patient's history were reviewed and updated as appropriate:   allergies, current medications, past family history, past medical history, past social history, past surgical history and problem list.    Objective:    BP 100/60  Ht 6' 1" (1.854 m)  Wt 240 lb (108.863 kg)  BMI 31.66 kg/m2  Vital signs are stable as recorded  General appearance is normal  The patient is alert and oriented x3  The patient's mood and affect are normal  Gait assessment:   The cardiovascular exam reveals normal pulses and temperature without edema swelling.  The lymphatic system is negative for palpable lymph nodes  The sensory exam is normal.  There are no pathologic reflexes.  Balance is normal.  Upper extremity exam  Inspection and palpation revealed no abnormalities in the upper extremities.  Range of motion is full without contracture.  Motor exam is normal with grade 5 strength.  The joints are fully reduced without subluxation.  There is no atrophy or tremor and muscle tone is normal.  All joints are stable.  left ankle:   the patient is  unable to ambulate or stand with her significant up.  He is awake and LEFT ankle.  She has tenderness and swelling over the lateral aspects of the ankle were she also has limited range of motion secondary to pain with weakness in plantarflexion and dorsiflexion.  Ankle is unstable.  Right  ankle:   normal range of motion strength stability and alignment with flexible pes planus   Imaging: X-ray of LEFT ankle from the hospital show bimalleolar ankle fracture with displacement of the fibula.  Assessment:    Fracture of LEFT ankle bimalleolar with displacement of the fibula.  The concern is that the patient will not be able to hold this in a cast.  She will not be able to be compliant as well.  He    Plan:    open treatment internal fixation LEFT ankle for bimalleolar fracture.  We plan on discharging the patient after surgery but we may have to reassess thepatient'sambulatorystatusandnoncompliance.  

## 2012-01-17 NOTE — Patient Instructions (Addendum)
Surgery tomorrow left ankle plate and screws and pins   No food after midnight. You can take your blood pressure medicines with a sip of water.  You have been scheduled for surgery.  All surgeries carry some risk.  Remember you always have the option of continued nonsurgical treatment. However in this situation the risks vs. the benefits favor surgery as the best treatment option. The risks of the surgery includes the following but is not limited to bleeding, infection, pulmonary embolus, death from anesthesia, nerve injury vascular injury or need for further surgery, continued pain.  Specific to this procedure the following risks and complications are rare but possible Stiffness, pain, Hardware or issues with the metal such as pain when the weather changes. The pain may require that the implant be removed after a year.

## 2012-01-18 ENCOUNTER — Encounter (HOSPITAL_COMMUNITY): Payer: Self-pay | Admitting: Anesthesiology

## 2012-01-18 ENCOUNTER — Ambulatory Visit (HOSPITAL_COMMUNITY): Payer: Medicare PPO | Admitting: Anesthesiology

## 2012-01-18 ENCOUNTER — Encounter (HOSPITAL_COMMUNITY)
Admission: RE | Admit: 2012-01-18 | Discharge: 2012-01-18 | Disposition: A | Discharge status: Home / Self Care | Payer: Medicare PPO | Source: Ambulatory Visit | Attending: Orthopedic Surgery | Admitting: Orthopedic Surgery

## 2012-01-18 ENCOUNTER — Ambulatory Visit (HOSPITAL_COMMUNITY): Payer: Medicare PPO

## 2012-01-18 ENCOUNTER — Encounter (HOSPITAL_COMMUNITY): Payer: Self-pay

## 2012-01-18 ENCOUNTER — Encounter (HOSPITAL_COMMUNITY)
Admission: RE | Disposition: A | Discharge status: Home / Self Care | Payer: Self-pay | Source: Ambulatory Visit | Attending: Orthopedic Surgery

## 2012-01-18 ENCOUNTER — Ambulatory Visit (HOSPITAL_COMMUNITY)
Admission: RE | Admit: 2012-01-18 | Discharge: 2012-01-18 | Disposition: A | Discharge status: Home / Self Care | Payer: Medicare PPO | Source: Ambulatory Visit | Attending: Orthopedic Surgery | Admitting: Orthopedic Surgery

## 2012-01-18 DIAGNOSIS — Z79899 Other long term (current) drug therapy: Secondary | ICD-10-CM | POA: Insufficient documentation

## 2012-01-18 DIAGNOSIS — Y92009 Unspecified place in unspecified non-institutional (private) residence as the place of occurrence of the external cause: Secondary | ICD-10-CM | POA: Insufficient documentation

## 2012-01-18 DIAGNOSIS — S82843A Displaced bimalleolar fracture of unspecified lower leg, initial encounter for closed fracture: Secondary | ICD-10-CM

## 2012-01-18 DIAGNOSIS — G4733 Obstructive sleep apnea (adult) (pediatric): Secondary | ICD-10-CM | POA: Insufficient documentation

## 2012-01-18 DIAGNOSIS — X500XXA Overexertion from strenuous movement or load, initial encounter: Secondary | ICD-10-CM | POA: Insufficient documentation

## 2012-01-18 DIAGNOSIS — J449 Chronic obstructive pulmonary disease, unspecified: Secondary | ICD-10-CM | POA: Insufficient documentation

## 2012-01-18 DIAGNOSIS — J4489 Other specified chronic obstructive pulmonary disease: Secondary | ICD-10-CM | POA: Insufficient documentation

## 2012-01-18 DIAGNOSIS — I10 Essential (primary) hypertension: Secondary | ICD-10-CM | POA: Insufficient documentation

## 2012-01-18 DIAGNOSIS — Z01812 Encounter for preprocedural laboratory examination: Secondary | ICD-10-CM | POA: Insufficient documentation

## 2012-01-18 HISTORY — PX: ORIF ANKLE FRACTURE: SHX5408

## 2012-01-18 HISTORY — DX: Sleep apnea, unspecified: G47.30

## 2012-01-18 LAB — PROTIME-INR
INR: 1.03 (ref 0.00–1.49)
Prothrombin Time: 13.7 seconds (ref 11.6–15.2)

## 2012-01-18 LAB — APTT: aPTT: 24 seconds (ref 24–37)

## 2012-01-18 LAB — CBC
MCH: 26.2 pg (ref 26.0–34.0)
MCHC: 29.6 g/dL — ABNORMAL LOW (ref 30.0–36.0)
Platelets: 237 10*3/uL (ref 150–400)
RBC: 4.09 MIL/uL (ref 3.87–5.11)

## 2012-01-18 LAB — BASIC METABOLIC PANEL
Calcium: 9.4 mg/dL (ref 8.4–10.5)
Chloride: 103 mEq/L (ref 96–112)
Creatinine, Ser: 1.05 mg/dL (ref 0.50–1.10)
GFR calc Af Amer: 61 mL/min — ABNORMAL LOW (ref >90)
Sodium: 142 mEq/L (ref 135–145)

## 2012-01-18 LAB — SURGICAL PCR SCREEN
MRSA, PCR: NEGATIVE
Staphylococcus aureus: NEGATIVE

## 2012-01-18 SURGERY — OPEN REDUCTION INTERNAL FIXATION (ORIF) ANKLE FRACTURE
Anesthesia: Spinal | Site: Ankle | Laterality: Left | Wound class: Clean

## 2012-01-18 MED ORDER — MIDAZOLAM HCL 2 MG/2ML IJ SOLN
INTRAMUSCULAR | Status: AC
  Filled 2012-01-18: qty 2

## 2012-01-18 MED ORDER — METHOCARBAMOL 100 MG/ML IJ SOLN
500.0000 mg | Freq: Once | INTRAVENOUS | Status: AC
  Administered 2012-01-18: 500 mg via INTRAVENOUS
  Filled 2012-01-18: qty 5

## 2012-01-18 MED ORDER — CEFAZOLIN SODIUM-DEXTROSE 2-3 GM-% IV SOLR
INTRAVENOUS | Status: AC
  Filled 2012-01-18: qty 50

## 2012-01-18 MED ORDER — ONDANSETRON HCL 4 MG/2ML IJ SOLN: 4.0000 mg | Freq: Once | INTRAMUSCULAR | Status: DC | PRN

## 2012-01-18 MED ORDER — PHENYLEPHRINE HCL 10 MG/ML IJ SOLN
INTRAMUSCULAR | Status: AC
  Filled 2012-01-18: qty 1

## 2012-01-18 MED ORDER — LACTATED RINGERS IV SOLN
INTRAVENOUS | Status: DC
  Administered 2012-01-18 (×2): via INTRAVENOUS

## 2012-01-18 MED ORDER — MIDAZOLAM HCL 2 MG/2ML IJ SOLN
1.0000 mg | INTRAMUSCULAR | Status: DC | PRN
  Administered 2012-01-18: 1 mg via INTRAVENOUS

## 2012-01-18 MED ORDER — FENTANYL CITRATE 0.05 MG/ML IJ SOLN
INTRAMUSCULAR | Status: DC | PRN
  Administered 2012-01-18: 20 ug via INTRATHECAL
  Administered 2012-01-18: 50 ug via INTRAVENOUS

## 2012-01-18 MED ORDER — HYDROCODONE-ACETAMINOPHEN 10-325 MG PO TABS: 1.0000 | ORAL_TABLET | ORAL | Status: AC | PRN

## 2012-01-18 MED ORDER — BUPIVACAINE IN DEXTROSE 0.75-8.25 % IT SOLN
INTRATHECAL | Status: AC
  Filled 2012-01-18: qty 2

## 2012-01-18 MED ORDER — LIDOCAINE HCL (PF) 1 % IJ SOLN
INTRAMUSCULAR | Status: AC
  Filled 2012-01-18: qty 5

## 2012-01-18 MED ORDER — EPHEDRINE SULFATE 50 MG/ML IJ SOLN
INTRAMUSCULAR | Status: AC
  Filled 2012-01-18: qty 1

## 2012-01-18 MED ORDER — SODIUM CHLORIDE 0.9 % IR SOLN
Status: DC | PRN
  Administered 2012-01-18: 1000 mL

## 2012-01-18 MED ORDER — OXYCODONE HCL 5 MG PO TABS
ORAL_TABLET | ORAL | Status: AC
  Filled 2012-01-18: qty 1

## 2012-01-18 MED ORDER — PROPOFOL 10 MG/ML IV EMUL
INTRAVENOUS | Status: DC | PRN
  Administered 2012-01-18: 20 ug/kg/min via INTRAVENOUS

## 2012-01-18 MED ORDER — ONDANSETRON HCL 4 MG/2ML IJ SOLN
4.0000 mg | Freq: Once | INTRAMUSCULAR | Status: AC
  Administered 2012-01-18: 4 mg via INTRAVENOUS

## 2012-01-18 MED ORDER — BUPIVACAINE-EPINEPHRINE PF 0.5-1:200000 % IJ SOLN
INTRAMUSCULAR | Status: AC
  Filled 2012-01-18: qty 20

## 2012-01-18 MED ORDER — CHLORHEXIDINE GLUCONATE 4 % EX LIQD
60.0000 mL | Freq: Once | CUTANEOUS | Status: DC
  Filled 2012-01-18: qty 60

## 2012-01-18 MED ORDER — PROPOFOL 10 MG/ML IV EMUL
INTRAVENOUS | Status: AC
  Filled 2012-01-18: qty 20

## 2012-01-18 MED ORDER — CEFAZOLIN SODIUM-DEXTROSE 2-3 GM-% IV SOLR: 2.0000 g | INTRAVENOUS | Status: DC

## 2012-01-18 MED ORDER — MUPIROCIN 2 % EX OINT
TOPICAL_OINTMENT | CUTANEOUS | Status: AC
  Filled 2012-01-18: qty 22

## 2012-01-18 MED ORDER — FENTANYL CITRATE 0.05 MG/ML IJ SOLN: 25.0000 ug | INTRAMUSCULAR | Status: DC | PRN

## 2012-01-18 MED ORDER — BUPIVACAINE HCL 0.75 % IJ SOLN
INTRAMUSCULAR | Status: DC | PRN
  Administered 2012-01-18: 15 mg via INTRATHECAL

## 2012-01-18 MED ORDER — ONDANSETRON HCL 4 MG/2ML IJ SOLN
INTRAMUSCULAR | Status: AC
  Filled 2012-01-18: qty 2

## 2012-01-18 MED ORDER — BUPIVACAINE-EPINEPHRINE PF 0.5-1:200000 % IJ SOLN
INTRAMUSCULAR | Status: DC | PRN
  Administered 2012-01-18: 60 mL

## 2012-01-18 MED ORDER — EPHEDRINE SULFATE 50 MG/ML IJ SOLN
INTRAMUSCULAR | Status: DC | PRN
  Administered 2012-01-18: 10 mg via INTRAVENOUS

## 2012-01-18 MED ORDER — FENTANYL CITRATE 0.05 MG/ML IJ SOLN
INTRAMUSCULAR | Status: AC
  Filled 2012-01-18: qty 2

## 2012-01-18 MED ORDER — PHENYLEPHRINE HCL 10 MG/ML IJ SOLN
INTRAMUSCULAR | Status: DC | PRN
  Administered 2012-01-18 (×2): 50 ug via INTRAVENOUS
  Administered 2012-01-18: 100 ug via INTRAVENOUS

## 2012-01-18 MED ORDER — BUPIVACAINE-EPINEPHRINE PF 0.5-1:200000 % IJ SOLN
INTRAMUSCULAR | Status: AC
  Filled 2012-01-18: qty 10

## 2012-01-18 MED ORDER — MIDAZOLAM HCL 5 MG/5ML IJ SOLN
INTRAMUSCULAR | Status: DC | PRN
  Administered 2012-01-18: 2 mg via INTRAVENOUS

## 2012-01-18 MED ORDER — OXYCODONE HCL 5 MG PO TABS
5.0000 mg | ORAL_TABLET | ORAL | Status: DC
  Administered 2012-01-18: 5 mg via ORAL

## 2012-01-18 SURGICAL SUPPLY — 70 items
BAG HAMPER (MISCELLANEOUS) ×2 IMPLANT
BANDAGE ELASTIC 4 VELCRO NS (GAUZE/BANDAGES/DRESSINGS) ×4 IMPLANT
BANDAGE ELASTIC 6 VELCRO NS (GAUZE/BANDAGES/DRESSINGS) ×2 IMPLANT
BANDAGE ESMARK 4X12 BL STRL LF (DISPOSABLE) ×1 IMPLANT
BIT DRILL 2.5X110 QC LCP DISP (BIT) ×2 IMPLANT
BIT DRILL 2.8 (BIT)
BIT DRILL CANN QC 2.8X165 (BIT) IMPLANT
BIT DRILL Q/COUPLING 1 (BIT) ×2 IMPLANT
BIT DRILL QC 3.5X110 (BIT) IMPLANT
BLADE SURG SZ10 CARB STEEL (BLADE) ×2 IMPLANT
BNDG ESMARK 4X12 BLUE STRL LF (DISPOSABLE) ×2
CHLORAPREP W/TINT 26ML (MISCELLANEOUS) ×2 IMPLANT
CLOTH BEACON ORANGE TIMEOUT ST (SAFETY) ×2 IMPLANT
COVER LIGHT HANDLE STERIS (MISCELLANEOUS) ×6 IMPLANT
CUFF TOURNIQUET SINGLE 34IN LL (TOURNIQUET CUFF) ×2 IMPLANT
DRAPE C-ARM FOLDED MOBILE STRL (DRAPES) ×2 IMPLANT
DRILL BIT 2.8MM (BIT)
GAUZE XEROFORM 5X9 LF (GAUZE/BANDAGES/DRESSINGS) ×2 IMPLANT
GLOVE ECLIPSE 6.5 STRL STRAW (GLOVE) ×4 IMPLANT
GLOVE EXAM NITRILE MD LF STRL (GLOVE) ×4 IMPLANT
GLOVE INDICATOR 7.0 STRL GRN (GLOVE) ×8 IMPLANT
GLOVE SKINSENSE NS SZ8.0 LF (GLOVE) ×1
GLOVE SKINSENSE STRL SZ8.0 LF (GLOVE) ×1 IMPLANT
GLOVE SS BIOGEL STRL SZ 6.5 (GLOVE) ×1 IMPLANT
GLOVE SS N UNI LF 8.5 STRL (GLOVE) ×2 IMPLANT
GLOVE SUPERSENSE BIOGEL SZ 6.5 (GLOVE) ×1
GOWN STRL REIN XL XLG (GOWN DISPOSABLE) ×10 IMPLANT
INST SET MINOR BONE (KITS) ×2 IMPLANT
K-WIRE 1.25 TRCR POINT 150 (WIRE) ×4
K-WIRE 1.6X150 (WIRE)
K-WIRE 2.0X150M (WIRE)
KIT ROOM TURNOVER APOR (KITS) ×2 IMPLANT
KWIRE 1.25 TRCR POINT 150 (WIRE) ×2 IMPLANT
KWIRE 1.6X150 (WIRE) IMPLANT
KWIRE 2.0X150M (WIRE) IMPLANT
MANIFOLD NEPTUNE II (INSTRUMENTS) ×2 IMPLANT
NEEDLE HYPO 21X1.5 SAFETY (NEEDLE) ×2 IMPLANT
NS IRRIG 1000ML POUR BTL (IV SOLUTION) ×2 IMPLANT
PACK BASIC LIMB (CUSTOM PROCEDURE TRAY) ×2 IMPLANT
PAD ABD 5X9 TENDERSORB (GAUZE/BANDAGES/DRESSINGS) IMPLANT
PAD ARMBOARD 7.5X6 YLW CONV (MISCELLANEOUS) ×2 IMPLANT
PAD CAST 4YDX4 CTTN HI CHSV (CAST SUPPLIES) ×1 IMPLANT
PADDING CAST COTTON 4X4 STRL (CAST SUPPLIES) ×1
PLATE LCP 3.5 1/3 TUB 8HX93 (Plate) ×2 IMPLANT
SCREW CANC FT 12 4.0 (Screw) ×2 IMPLANT
SCREW CANC FT 4.0X20 (Screw) ×2 IMPLANT
SCREW CANC FT ST SFS 4X14 (Screw) ×2 IMPLANT
SCREW CANC FT/18 4.0 (Screw) ×2 IMPLANT
SCREW CANCEL 4.0 26MM (Screw) ×2 IMPLANT
SCREW CORTEX 3.5 14MM (Screw) ×2 IMPLANT
SCREW CORTEX 3.5 16MM (Screw) ×1 IMPLANT
SCREW CORTEX 3.5 18MM (Screw) ×2 IMPLANT
SCREW CORTEX 3.5 26MM (Screw) ×1 IMPLANT
SCREW LOCK CORT ST 3.5X14 (Screw) ×2 IMPLANT
SCREW LOCK CORT ST 3.5X16 (Screw) ×1 IMPLANT
SCREW LOCK CORT ST 3.5X18 (Screw) ×2 IMPLANT
SCREW LOCK CORT ST 3.5X26 (Screw) ×1 IMPLANT
SET BASIN LINEN APH (SET/KITS/TRAYS/PACK) ×2 IMPLANT
SPLINT IMMOBILIZER J 3INX20FT (CAST SUPPLIES)
SPLINT J IMMOBILIZER 3X20FT (CAST SUPPLIES) IMPLANT
SPLINT J IMMOBILIZER 4X20FT (CAST SUPPLIES) ×1 IMPLANT
SPLINT J PLASTER J 4INX20Y (CAST SUPPLIES) ×1
SPONGE GAUZE 4X4 12PLY (GAUZE/BANDAGES/DRESSINGS) ×2 IMPLANT
SPONGE LAP 18X18 X RAY DECT (DISPOSABLE) ×2 IMPLANT
STAPLER VISISTAT 35W (STAPLE) ×2 IMPLANT
SUT ETHILON 3 0 FSL (SUTURE) ×2 IMPLANT
SUT MON AB 0 CT1 (SUTURE) ×2 IMPLANT
SUT MON AB 2-0 CT1 36 (SUTURE) ×4 IMPLANT
SYR 30ML LL (SYRINGE) ×2 IMPLANT
SYR BULB IRRIGATION 50ML (SYRINGE) ×2 IMPLANT

## 2012-01-18 NOTE — Op Note (Signed)
01/18/2012  3:28 PM  PATIENT:  [name]  71 y.o. female  PRE-OPERATIVE DIAGNOSIS:  left ankle bimalleolar fracture  POST-OPERATIVE DIAGNOSIS:  left ankle bimalleolar fracture  Findings: Lateral oblique distal fibular fracture Weber B. type. Medial anterior colliculus fracture with deltoid avulsion  PROCEDURE:  Procedure(s) (LRB): OPEN REDUCTION INTERNAL FIXATION (ORIF) bimalleolar ANKLE FRACTURE (Left)  Lateral plate and screws and medial 0.045 k wires   Details of procedure  The site was confirmed and marked in the preop area. Surgical consent was reviewed and signed in the chart was reviewed  The patient was taken to the operating room for spinal anesthetic. She was placed in supine position. A tourniquet was placed on the left eye the leg was prepped and draped in sterile technique a bump was placed under the left hip. Timeout was executed in completed  X-rays were available implants were checked  After exsanguination of the limb with a six-inch Esmarch tourniquet was elevated to 300 mm mercury where it stayed for 83 minutes.  Lateral incision was made down to bone exposing the fracture and the lateral malleolus. The fracture was debrided irrigated and then manually reduced and held with a bone clamp. The reduction was confirmed by x-ray. An interfragmentary screw was placed using AO technique. We drilled a 3.2 glide hole a 2.5 distal hole used to countersink measured and placed a cortical screw. X-ray confirmed reduction of the fracture. Bone clamps were removed. A lateral plate was then applied after it was contoured. Reduced distal cancellus screws and proximal cortical screws. X-rays confirm reduction of the mortise and position of the plate.  Medial incision was then made over the anterior portion of the medial malleolus. This was taken down to bone. Blunt dissection was carried out at the distal edge of the incision to protect the posterior tibial tendon and  neurovascular bundle  This was a small for comminuted fragment with evidence of deltoid rupture  K wires were used to reduce the anterior colliculus this was confirmed by x-ray. The K wires were palpated just exiting the cortex of the tibia proximally and posteriorly. The deltoid ligament was repaired with a 2-0 Monocryl suture  Radiographs were taken of the entire ankle construct using 3 views and the fracture was reduced.  The pins were bent and driven into the medial malleolus. The wounds were irrigated with copious amounts of saline  The lateral side was closed with 2-0 Monocryl and staples. The medial side was closed with 2-0 Monocryl and 3-0 nylon.  Sterile dressings were applied and a posterior splint was applied with the foot in neutral position.  Postop plan nonweightbearing for probably 6 weeks. Discharge home after recovery  SURGEON:  Surgeon(s) and Role:    * Fuller Canada, MD - Primary  PHYSICIAN ASSISTANT:   ASSISTANTS: BETTY ASHLEY    ANESTHESIA:   spinal  EBL:  Total I/O In: 1000 [I.V.:1000] Out: 20 [Blood:20]  BLOOD ADMINISTERED:none  DRAINS: none   LOCAL MEDICATIONS USED:  MARCAINE     SPECIMEN:  No Specimen  DISPOSITION OF SPECIMEN:  N/A  COUNTS:  YES  TOURNIQUET:   Total Tourniquet Time Documented: Thigh (Left) - 84 minutes  DICTATION: .Dragon Dictation  PLAN OF CARE: Discharge to home after PACU  PATIENT DISPOSITION:  PACU - hemodynamically stable.   Delay start of Pharmacological VTE agent (>24hrs) due to surgical blood loss or risk of bleeding: not applicable Ortho

## 2012-01-18 NOTE — H&P (Signed)
  [name] is a 71 y.o. female who presents with LEFT ankle bimalleolar fracture sustained on January 16, 2012  The patient has a history of COPD and is on oxygen therapy and noncompliant with her CPAP machine.  She slipped at home on a wooden floor with socks on presented to the emergency room on the third and x-rays show bimalleolar fracture with displacement. She complains of 6/10 throbbing pain with bruising swelling stiffness and some numbness in the foot which is intermittent.  She is a history of shortness of breath wheezing snoring COPD neurologic numbness and unsteady gait with tremors she uses a walker with a seat for ambulation. She has a history of depression and nervousness she bruises easily and she has seasonal ALLERGIES  The remaining systems were reviewed and were normal.  The following portions of the patient's history were reviewed and updated as appropriate:  allergies, current medications, past family history, past medical history, past social history, past surgical history and problem list.  Objective:   BP 100/60  Ht 6\' 1"  (1.854 m)  Wt 240 lb (108.863 kg)  BMI 31.66 kg/m2  Vital signs are stable as recorded  General appearance is normal  The patient is alert and oriented x3  The patient's mood and affect are normal  Gait assessment:  The cardiovascular exam reveals normal pulses and temperature without edema swelling.  The lymphatic system is negative for palpable lymph nodes  The sensory exam is normal.  There are no pathologic reflexes.  Balance is normal.  Upper extremity exam  Inspection and palpation revealed no abnormalities in the upper extremities. Range of motion is full without contracture.  Motor exam is normal with grade 5 strength.  The joints are fully reduced without subluxation.  There is no atrophy or tremor and muscle tone is normal. All joints are stable.  left ankle:  the patient is unable to ambulate or stand with her significant up. He  is awake and LEFT ankle. She has tenderness and swelling over the lateral aspects of the ankle were she also has limited range of motion secondary to pain with weakness in plantarflexion and dorsiflexion. Ankle is unstable.   Right ankle:  normal range of motion strength stability and alignment with flexible pes planus   Imaging:  X-ray of LEFT ankle from the hospital show bimalleolar ankle fracture with displacement of the fibula.  Assessment:   Fracture of LEFT ankle bimalleolar with displacement of the fibula. The concern is that the patient will not be able to hold this in a cast. She will not be able to be compliant as well. He  Plan:   open treatment internal fixation LEFT ankle for bimalleolar fracture. We plan on discharging the patient after surgery but we may have to reassess thepatient'sambulatorystatusandnoncompliance.

## 2012-01-18 NOTE — Interval H&P Note (Signed)
History and Physical Interval Note:  01/18/2012 1:01 PM  [name]  has presented today for surgery, with the diagnosis of left ankle bimalleolar fracture  The various methods of treatment have been discussed with the patient and family. After consideration of risks, benefits and other options for treatment, the patient has consented to  Procedure(s) (LRB):LEFT OPEN REDUCTION INTERNAL FIXATION (ORIF) ANKLE FRACTURE (Left) as a surgical intervention .  The patients' history has been reviewed, patient examined, no change in status, stable for surgery.  I have reviewed the patients' chart and labs.  Questions were answered to the patient's satisfaction.     Fuller Canada

## 2012-01-18 NOTE — Anesthesia Postprocedure Evaluation (Signed)
  Anesthesia Post-op Note  Patient: [name]  Procedure(s) Performed: Procedure(s) (LRB): OPEN REDUCTION INTERNAL FIXATION (ORIF) ANKLE FRACTURE (Left)  Patient Location: PACU  Anesthesia Type: General and Spinal  Level of Consciousness: awake, alert  and oriented  Airway and Oxygen Therapy: Patient Spontanous Breathing and Patient connected to face mask oxygen  Post-op Pain: none  Post-op Assessment: Post-op Vital signs reviewed, Patient's Cardiovascular Status Stable, Respiratory Function Stable, Patent Airway and No signs of Nausea or vomiting  Post-op Vital Signs: Reviewed and stable  Complications: No apparent anesthesia complications

## 2012-01-18 NOTE — H&P (View-Only) (Signed)
  Subjective:    [name] is a 71 y.o. female who presents with LEFT ankle bimalleolar fracture sustained on January 16, 2012  The patient has a history of COPD and is on oxygen therapy and noncompliant with her CPAP machine.  She slipped at home on a wooden floor with socks on presented to the emergency room on the third and x-rays show bimalleolar fracture with displacement.  She complains of 6/10 throbbing pain with bruising swelling stiffness and some numbness in the foot which is intermittent.  She is a history of shortness of breath wheezing snoring COPD neurologic numbness and unsteady gait with tremors she uses a walker with a seat for ambulation.  She has a history of depression and nervousness she bruises easily and she has seasonal ALLERGIES  The remaining systems were reviewed and were normal.  The following portions of the patient's history were reviewed and updated as appropriate:   allergies, current medications, past family history, past medical history, past social history, past surgical history and problem list.    Objective:    BP 100/60  Ht 6\' 1"  (1.854 m)  Wt 240 lb (108.863 kg)  BMI 31.66 kg/m2  Vital signs are stable as recorded  General appearance is normal  The patient is alert and oriented x3  The patient's mood and affect are normal  Gait assessment:   The cardiovascular exam reveals normal pulses and temperature without edema swelling.  The lymphatic system is negative for palpable lymph nodes  The sensory exam is normal.  There are no pathologic reflexes.  Balance is normal.  Upper extremity exam  Inspection and palpation revealed no abnormalities in the upper extremities.  Range of motion is full without contracture.  Motor exam is normal with grade 5 strength.  The joints are fully reduced without subluxation.  There is no atrophy or tremor and muscle tone is normal.  All joints are stable.  left ankle:   the patient is  unable to ambulate or stand with her significant up.  He is awake and LEFT ankle.  She has tenderness and swelling over the lateral aspects of the ankle were she also has limited range of motion secondary to pain with weakness in plantarflexion and dorsiflexion.  Ankle is unstable.  Right  ankle:   normal range of motion strength stability and alignment with flexible pes planus   Imaging: X-ray of LEFT ankle from the hospital show bimalleolar ankle fracture with displacement of the fibula.  Assessment:    Fracture of LEFT ankle bimalleolar with displacement of the fibula.  The concern is that the patient will not be able to hold this in a cast.  She will not be able to be compliant as well.  He    Plan:    open treatment internal fixation LEFT ankle for bimalleolar fracture.  We plan on discharging the patient after surgery but we may have to reassess thepatient'sambulatorystatusandnoncompliance.

## 2012-01-18 NOTE — Interval H&P Note (Signed)
History and Physical Interval Note:  01/18/2012 1:04 PM  [name]  has presented today for surgery, with the diagnosis of left ankle bimalleolar fracture  The various methods of treatment have been discussed with the patient and family. After consideration of risks, benefits and other options for treatment, the patient has consented to  Procedure(s) (LRB): OPEN REDUCTION INTERNAL FIXATION (ORIF) ANKLE FRACTURE (Left) as a surgical intervention .  The patients' history has been reviewed, patient examined, no change in status, stable for surgery.  I have reviewed the patients' chart and labs.  Questions were answered to the patient's satisfaction.     Fuller Canada

## 2012-01-18 NOTE — Anesthesia Preprocedure Evaluation (Addendum)
Anesthesia Evaluation  Patient identified by MRN, date of birth, ID band Patient awake    Reviewed: Allergy & Precautions, H&P , NPO status , Patient's Chart, lab work & pertinent test results  History of Anesthesia Complications Negative for: history of anesthetic complications  Airway Mallampati: II TM Distance: >3 FB     Dental  (+) Edentulous Upper and Edentulous Lower   Pulmonary shortness of breath, asthma , sleep apnea , COPD breath sounds clear to auscultation        Cardiovascular hypertension, Pt. on medications Rhythm:Regular Rate:Normal     Neuro/Psych Anxiety Depression    GI/Hepatic GERD-  Medicated and Controlled,  Endo/Other    Renal/GU      Musculoskeletal  (+) Arthritis - (chronic LBP, hx epidural steroid injections),   Abdominal   Peds  Hematology   Anesthesia Other Findings   Reproductive/Obstetrics                           Anesthesia Physical Anesthesia Plan  ASA: III  Anesthesia Plan: Spinal   Post-op Pain Management:    Induction: Intravenous  Airway Management Planned: Nasal Cannula  Additional Equipment:   Intra-op Plan:   Post-operative Plan:   Informed Consent: I have reviewed the patients History and Physical, chart, labs and discussed the procedure including the risks, benefits and alternatives for the proposed anesthesia with the patient or authorized representative who has indicated his/her understanding and acceptance.     Plan Discussed with:   Anesthesia Plan Comments:         Anesthesia Quick Evaluation

## 2012-01-18 NOTE — Anesthesia Procedure Notes (Signed)
Spinal  Patient location during procedure: OR Start time: 01/18/2012 1:28 PM Staffing CRNA/Resident: Glynn Octave Preanesthetic Checklist Completed: patient identified, site marked, surgical consent, pre-op evaluation, timeout performed, IV checked, risks and benefits discussed and monitors and equipment checked Spinal Block Patient position: left lateral decubitus Prep: Betadine Patient monitoring: heart rate, cardiac monitor, continuous pulse ox and blood pressure Approach: left paramedian Location: L3-4 Injection technique: single-shot Needle Needle type: Spinocan  Needle gauge: 22 G Needle length: 9 cm Assessment Sensory level: T8 Additional Notes  ATTEMPTS: 1 TRAY ID : [id number] TRAY EXPIRATION DATE:09/2012

## 2012-01-18 NOTE — Progress Notes (Signed)
01/18/12 1139  OBSTRUCTIVE SLEEP APNEA  Have you ever been diagnosed with sleep apnea through a sleep study? Yes  If yes, do you have and use a CPAP or BPAP machine every night? 1  Do you know the presssure settings on your maching? No  Do you snore loudly (loud enough to be heard through closed doors)?  1  Do you often feel tired, fatigued, or sleepy during the daytime? 1  Has anyone observed you stop breathing during your sleep? 1  Do you have, or are you being treated for high blood pressure? 1  BMI more than 35 kg/m2? 1  Age over 68 years old? 1  Neck circumference greater than 40 cm/18 inches? 0  Gender: 0  Obstructive Sleep Apnea Score 6   Score 4 or greater  Updated health history

## 2012-01-18 NOTE — Transfer of Care (Signed)
Immediate Anesthesia Transfer of Care Note  Patient: [name]  Procedure(s) Performed: Procedure(s) (LRB): OPEN REDUCTION INTERNAL FIXATION (ORIF) ANKLE FRACTURE (Left)  Patient Location: PACU  Anesthesia Type: Spinal  Level of Consciousness: awake, alert  and oriented  Airway & Oxygen Therapy: Patient Spontanous Breathing and Patient connected to face mask oxygen  Post-op Assessment: Report given to PACU RN  Post vital signs: Reviewed and stable  Complications: No apparent anesthesia complications

## 2012-01-18 NOTE — Brief Op Note (Signed)
01/18/2012  3:28 PM  PATIENT:  [name]  71 y.o. female  PRE-OPERATIVE DIAGNOSIS:  left ankle bimalleolar fracture  POST-OPERATIVE DIAGNOSIS:  left ankle bimalleolar fracture  Findings: Lateral oblique distal fibular fracture Weber B. type. Medial anterior colliculus fracture with deltoid avulsion  PROCEDURE:  Procedure(s) (LRB): OPEN REDUCTION INTERNAL FIXATION (ORIF) bimalleolar ANKLE FRACTURE (Left)  Lateral plates and screws and medial 0.045 k wires   SURGEON:  Surgeon(s) and Role:    * Fuller Canada, MD - Primary  PHYSICIAN ASSISTANT:   ASSISTANTS: BETTY ASHLEY    ANESTHESIA:   spinal  EBL:  Total I/O In: 1000 [I.V.:1000] Out: 20 [Blood:20]  BLOOD ADMINISTERED:none  DRAINS: none   LOCAL MEDICATIONS USED:  MARCAINE     SPECIMEN:  No Specimen  DISPOSITION OF SPECIMEN:  N/A  COUNTS:  YES  TOURNIQUET:   Total Tourniquet Time Documented: Thigh (Left) - 84 minutes  DICTATION: .Dragon Dictation  PLAN OF CARE: Discharge to home after PACU  PATIENT DISPOSITION:  PACU - hemodynamically stable.   Delay start of Pharmacological VTE agent (>24hrs) due to surgical blood loss or risk of bleeding: not applicable Ortho

## 2012-01-20 ENCOUNTER — Telehealth: Payer: Self-pay | Admitting: *Deleted

## 2012-01-20 DIAGNOSIS — S82843A Displaced bimalleolar fracture of unspecified lower leg, initial encounter for closed fracture: Secondary | ICD-10-CM

## 2012-01-20 NOTE — Telephone Encounter (Signed)
Ok   Order PT nwb operative leg

## 2012-01-21 ENCOUNTER — Encounter (HOSPITAL_COMMUNITY): Payer: Self-pay | Admitting: Orthopedic Surgery

## 2012-01-21 NOTE — Telephone Encounter (Signed)
Home health PT order sent to [facility]

## 2012-01-25 ENCOUNTER — Ambulatory Visit (INDEPENDENT_AMBULATORY_CARE_PROVIDER_SITE_OTHER): Payer: Medicare PPO | Admitting: Orthopedic Surgery

## 2012-01-25 ENCOUNTER — Encounter: Payer: Self-pay | Admitting: Orthopedic Surgery

## 2012-01-25 VITALS — BP 132/60 | Ht 73.0 in | Wt 240.0 lb

## 2012-01-25 DIAGNOSIS — S82843A Displaced bimalleolar fracture of unspecified lower leg, initial encounter for closed fracture: Secondary | ICD-10-CM

## 2012-01-25 NOTE — Progress Notes (Signed)
Patient ID: [name], female   DOB: [birth year], 71 y.o.   MRN: [id number] Chief Complaint  Patient presents with  . Routine Post Op    post op 1, ORIF Left ankle, DOS 01/18/12   The patient is doing okay mild to moderate pain  Exam shows multiple blisters on both sides of the leg.  Blisters are released today with sterile technique.  Sterile dressings were applied.  Posterior splint reapplied.  Followup in a week change dressing take x-rays.  Cast if blisters and wound looks good  [facility]

## 2012-01-25 NOTE — Patient Instructions (Signed)
Elevate and ice the leg.

## 2012-02-02 ENCOUNTER — Encounter: Payer: Self-pay | Admitting: Orthopedic Surgery

## 2012-02-02 ENCOUNTER — Ambulatory Visit (INDEPENDENT_AMBULATORY_CARE_PROVIDER_SITE_OTHER): Payer: Medicare PPO | Admitting: Orthopedic Surgery

## 2012-02-02 VITALS — Ht 73.0 in | Wt 240.0 lb

## 2012-02-02 DIAGNOSIS — S82843A Displaced bimalleolar fracture of unspecified lower leg, initial encounter for closed fracture: Secondary | ICD-10-CM

## 2012-02-02 NOTE — Progress Notes (Signed)
3 views, LEFT ankle.  LEFT ankle fracture, status post internal fixation.  Lateral malleolar fracture is anatomic. The medial malleolar fracture and pin fixation have slight separation. Medial clear space is normal.  Impression stabilized lateral malleolus and medial malleolar fractures with internal fixation.

## 2012-02-02 NOTE — Patient Instructions (Signed)
No weight bearing on injured foot   Keep  Cast dry   Do not get wet   If it gets wet dry with a hair dryer on low setting and call the office

## 2012-02-02 NOTE — Progress Notes (Signed)
Patient ID: [name], female   DOB: 06-27-1941, 71 y.o.   MRN: [id number] Chief Complaint  Patient presents with  . Follow-up    1 week recheck and xray left ankle, DOS 01/18/12    Staples and sutures are removed. Blisters are improved.  Short-leg cast applied.  X-ray LEFT ankle 3 views.  Internal fixation, medial and lateral with pins medially, plate and screw construct laterally. Ankle mortise is intact.  Short-leg cast applied. Follow up 4 weeks for x-ray out of plaster

## 2012-02-03 ENCOUNTER — Telehealth: Payer: Self-pay | Admitting: Family Medicine

## 2012-02-03 NOTE — Telephone Encounter (Signed)
noted 

## 2012-02-04 ENCOUNTER — Encounter: Payer: Self-pay | Admitting: Orthopedic Surgery

## 2012-02-04 ENCOUNTER — Ambulatory Visit (HOSPITAL_COMMUNITY): Payer: Medicare PPO

## 2012-02-10 ENCOUNTER — Telehealth: Payer: Self-pay | Admitting: Family Medicine

## 2012-02-10 NOTE — Telephone Encounter (Signed)
Patient aware it was being shipped

## 2012-03-02 ENCOUNTER — Encounter: Payer: Self-pay | Admitting: Orthopedic Surgery

## 2012-03-02 ENCOUNTER — Telehealth: Payer: Self-pay | Admitting: Orthopedic Surgery

## 2012-03-02 ENCOUNTER — Ambulatory Visit (INDEPENDENT_AMBULATORY_CARE_PROVIDER_SITE_OTHER): Payer: Medicare PPO | Admitting: Orthopedic Surgery

## 2012-03-02 ENCOUNTER — Other Ambulatory Visit: Payer: Self-pay

## 2012-03-02 ENCOUNTER — Telehealth: Payer: Self-pay | Admitting: Family Medicine

## 2012-03-02 ENCOUNTER — Ambulatory Visit (INDEPENDENT_AMBULATORY_CARE_PROVIDER_SITE_OTHER): Payer: Medicare PPO

## 2012-03-02 VITALS — BP 90/50 | Ht 73.0 in | Wt 240.0 lb

## 2012-03-02 DIAGNOSIS — S82892A Other fracture of left lower leg, initial encounter for closed fracture: Secondary | ICD-10-CM

## 2012-03-02 DIAGNOSIS — S82899A Other fracture of unspecified lower leg, initial encounter for closed fracture: Secondary | ICD-10-CM

## 2012-03-02 DIAGNOSIS — M25552 Pain in left hip: Secondary | ICD-10-CM

## 2012-03-02 MED ORDER — NAPROXEN 500 MG PO TABS: 500.0000 mg | ORAL_TABLET | Freq: Two times a day (BID) | ORAL | Status: DC

## 2012-03-02 NOTE — Patient Instructions (Signed)
OK TO WEIGHT BEAR AS TOLERATED WITH CAST SHOE AND WALKER

## 2012-03-02 NOTE — Progress Notes (Signed)
Patient ID: [name], female   DOB: 30-Jul-1941, 71 y.o.   MRN: [id number] Chief Complaint  Patient presents with  . Follow-up    4 week recheck and xray left ankle/DOS 01-18-2012    XRAYS OOP   FRACTURE HEALING APPROPRIATELY   WOUNDS ARE HEALING   BLISTERS HAVE RESOLVED   THE MEDIAL WOUND HAD A SUPERFICIAL SKIN SLOUGH   SLWC APPLIED

## 2012-03-02 NOTE — Telephone Encounter (Signed)
Per daughter and patient, hold on 6 month appointment for re-check scoliosis, due to current treatment for post operative fracture care.  States will call back at a later time to re-schedule this appointment.

## 2012-03-07 ENCOUNTER — Other Ambulatory Visit: Payer: Self-pay

## 2012-03-07 ENCOUNTER — Telehealth: Payer: Self-pay | Admitting: Family Medicine

## 2012-03-07 DIAGNOSIS — F329 Major depressive disorder, single episode, unspecified: Secondary | ICD-10-CM

## 2012-03-07 DIAGNOSIS — M25552 Pain in left hip: Secondary | ICD-10-CM

## 2012-03-07 DIAGNOSIS — K219 Gastro-esophageal reflux disease without esophagitis: Secondary | ICD-10-CM

## 2012-03-07 DIAGNOSIS — I10 Essential (primary) hypertension: Secondary | ICD-10-CM

## 2012-03-07 MED ORDER — NAPROXEN 500 MG PO TABS: 500.0000 mg | ORAL_TABLET | Freq: Two times a day (BID) | ORAL | Status: DC

## 2012-03-07 MED ORDER — VERAPAMIL HCL ER 240 MG PO TBCR: 240.0000 mg | EXTENDED_RELEASE_TABLET | Freq: Every day | ORAL | Status: DC

## 2012-03-07 MED ORDER — FLUOXETINE HCL 40 MG PO CAPS: 40.0000 mg | ORAL_CAPSULE | Freq: Every day | ORAL | Status: DC

## 2012-03-07 MED ORDER — OMEPRAZOLE 40 MG PO CPDR: 40.0000 mg | DELAYED_RELEASE_CAPSULE | Freq: Every day | ORAL | Status: DC

## 2012-03-07 NOTE — Telephone Encounter (Signed)
meds sent

## 2012-03-07 NOTE — Telephone Encounter (Signed)
Was sent in today

## 2012-03-09 ENCOUNTER — Telehealth: Payer: Self-pay | Admitting: Family Medicine

## 2012-03-09 NOTE — Telephone Encounter (Signed)
Sallie to schedule

## 2012-03-17 ENCOUNTER — Encounter: Payer: Self-pay | Admitting: Family Medicine

## 2012-03-17 ENCOUNTER — Ambulatory Visit (INDEPENDENT_AMBULATORY_CARE_PROVIDER_SITE_OTHER): Payer: Medicare PPO | Admitting: Family Medicine

## 2012-03-17 VITALS — BP 140/82 | HR 97 | Resp 18 | Ht 73.0 in | Wt 240.1 lb

## 2012-03-17 DIAGNOSIS — S82843A Displaced bimalleolar fracture of unspecified lower leg, initial encounter for closed fracture: Secondary | ICD-10-CM

## 2012-03-17 DIAGNOSIS — J449 Chronic obstructive pulmonary disease, unspecified: Secondary | ICD-10-CM

## 2012-03-17 DIAGNOSIS — F329 Major depressive disorder, single episode, unspecified: Secondary | ICD-10-CM

## 2012-03-17 DIAGNOSIS — I10 Essential (primary) hypertension: Secondary | ICD-10-CM

## 2012-03-17 NOTE — Patient Instructions (Signed)
F/u as before.  Blood pressure is 140/82, no need to change medication.  You dO  NEED to cut back on salt, salty foods, canned foods and sodas.  Eat a lot of fresh or frozen fruit and vegetable

## 2012-03-17 NOTE — Assessment & Plan Note (Signed)
Controlled, no change in medication  

## 2012-03-18 ENCOUNTER — Encounter: Payer: Self-pay | Admitting: Family Medicine

## 2012-03-18 NOTE — Assessment & Plan Note (Signed)
Currently receiving physical therapy under the supervision of orthopedics

## 2012-03-18 NOTE — Progress Notes (Signed)
  Subjective:    Patient ID: Merceda Elks, female    DOB: 06-09-1941, 71 y.o.   MRN: [id number]  HPI  Pt in primarily for review of her blood pressure, the therapist who is working with her since she fractured her left ankle had called in with concerns about her blood pressure being too elevated, also pt states she was also told that her heart rate was irregular.She had fractured the left  Ankle in March of this year ago had ORIF and is currently in therapy. She has no concerns or complaints, generally feels well Review of Systems See HPI Denies recent fever or chills. Denies sinus pressure, nasal congestion, ear pain or sore throat. Denies chest congestion, productive cough or wheezing.Chronic dependence on supplemental oxygen unchanged. Denies chest pains, palpitations and leg swelling Denies abdominal pain, nausea, vomiting,diarrhea or constipation.   Denies dysuria, frequency, hesitancy or incontinence.  Denies headaches, seizures, numbness, or tingling. Denies depression, anxiety or insomnia. Denies skin break down or rash.        Objective:   Physical Exam  Patient alert and oriented and in no cardiopulmonary distress.  HEENT: No facial asymmetry, EOMI, no sinus tenderness,  oropharynx pink and moist.  Neck supple no adenopathy.  Chest: Clear to auscultation bilaterally.Decreased air entry throughout  CVS: S1, S2 no murmurs, no S3.  ABD: Soft non tender. Bowel sounds normal.  Ext: No edema  WU:[id number] ROM spine, shoulders, hips and knees.left ankle in boot  Skin: Intact, no ulcerations or rash noted.  Psych: Good eye contact, normal affect. Memory intact not anxious or depressed appearing.  CNS: CN 2-12 intact, power, tone and sensation normal throughout.       Assessment & Plan:

## 2012-03-18 NOTE — Assessment & Plan Note (Signed)
Controlled, no change in medication  

## 2012-03-29 ENCOUNTER — Ambulatory Visit: Payer: Medicare PPO | Admitting: Orthopedic Surgery

## 2012-04-13 ENCOUNTER — Encounter: Payer: Self-pay | Admitting: Orthopedic Surgery

## 2012-04-13 ENCOUNTER — Ambulatory Visit (INDEPENDENT_AMBULATORY_CARE_PROVIDER_SITE_OTHER): Payer: Medicare PPO | Admitting: Orthopedic Surgery

## 2012-04-13 ENCOUNTER — Ambulatory Visit (INDEPENDENT_AMBULATORY_CARE_PROVIDER_SITE_OTHER): Payer: Medicare PPO

## 2012-04-13 VITALS — BP 100/60 | Ht 73.0 in | Wt 240.0 lb

## 2012-04-13 DIAGNOSIS — S82899A Other fracture of unspecified lower leg, initial encounter for closed fracture: Secondary | ICD-10-CM

## 2012-04-13 DIAGNOSIS — S82843A Displaced bimalleolar fracture of unspecified lower leg, initial encounter for closed fracture: Secondary | ICD-10-CM

## 2012-04-13 NOTE — Patient Instructions (Signed)
Wear brace for 2 months then followup with me for reexamination

## 2012-04-13 NOTE — Progress Notes (Addendum)
Patient ID: [name], female   DOB: Apr 14, 1941, 71 y.o.   MRN: [id number] Chief Complaint  Patient presents with  . Follow-up    DOS 01/18/2012     BP 100/60  Ht 6\' 1"  (1.854 m)  Wt 240 lb (108.863 kg)  BMI 31.66 kg/m2  S/P otif ankle LEFT   XRAYS TODAY    X-rays show that the fracture has healed there is some partial loss of fixation of the 2 medial pins but it is inconsequential  Clinically her foot looks good incisions look good around the ankle.  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 She was started on steroids. She also had neurologic consult

## 2012-04-13 NOTE — Progress Notes (Deleted)
Patient ID: [name], female   DOB: 03/14/1941, 71 y.o.   MRN: [id number] Subjective:

## 2012-04-13 NOTE — Progress Notes (Deleted)
Subjective:     Patient ID: [name], female   DOB: May 03, 1941, 71 y.o.   MRN: [id number]  HPI   Review of Systems     Objective:   Physical Exam     Assessment:     ***    Plan:     ***

## 2012-04-25 ENCOUNTER — Telehealth: Payer: Self-pay | Admitting: Family Medicine

## 2012-04-25 DIAGNOSIS — J449 Chronic obstructive pulmonary disease, unspecified: Secondary | ICD-10-CM

## 2012-04-25 MED ORDER — ARFORMOTEROL TARTRATE 15 MCG/2ML IN NEBU: 15.0000 ug | INHALATION_SOLUTION | Freq: Two times a day (BID) | RESPIRATORY_TRACT | Status: DC

## 2012-04-25 NOTE — Telephone Encounter (Signed)
Sent in

## 2012-04-27 ENCOUNTER — Ambulatory Visit (HOSPITAL_COMMUNITY)
Admission: RE | Admit: 2012-04-27 | Discharge: 2012-04-27 | Disposition: A | Discharge status: Home / Self Care | Payer: Medicare PPO | Source: Ambulatory Visit | Attending: Family Medicine | Admitting: Family Medicine

## 2012-04-27 DIAGNOSIS — Z139 Encounter for screening, unspecified: Secondary | ICD-10-CM

## 2012-04-27 DIAGNOSIS — Z1231 Encounter for screening mammogram for malignant neoplasm of breast: Secondary | ICD-10-CM | POA: Insufficient documentation

## 2012-05-25 ENCOUNTER — Telehealth: Payer: Self-pay

## 2012-05-25 NOTE — Telephone Encounter (Signed)
Called pt. She will be going out of town for a week or so. She will check with her daughter and see when would be a good time to schedule. She will call me back.

## 2012-06-08 ENCOUNTER — Telehealth: Payer: Self-pay | Admitting: Orthopedic Surgery

## 2012-06-08 NOTE — Telephone Encounter (Signed)
Regarding patient's appointment scheduled for 06/14/12 - our office had left messages with patient and patient's daughter, as we need to re-schedule the follow up appointment, due to change in Dr. Mort Sawyers schedule.  Spoke with patient's daughter; elects to call back to re-schedule follow up appointment due to her variable work schedule.  Patient placed on recall.

## 2012-06-14 ENCOUNTER — Ambulatory Visit: Payer: Medicare PPO | Admitting: Orthopedic Surgery

## 2012-06-15 ENCOUNTER — Telehealth: Payer: Self-pay | Admitting: Family Medicine

## 2012-06-15 DIAGNOSIS — I10 Essential (primary) hypertension: Secondary | ICD-10-CM

## 2012-06-15 DIAGNOSIS — K219 Gastro-esophageal reflux disease without esophagitis: Secondary | ICD-10-CM

## 2012-06-15 DIAGNOSIS — M25552 Pain in left hip: Secondary | ICD-10-CM

## 2012-06-15 MED ORDER — VERAPAMIL HCL ER 240 MG PO TBCR: 240.0000 mg | EXTENDED_RELEASE_TABLET | Freq: Every day | ORAL | Status: DC

## 2012-06-15 MED ORDER — NAPROXEN 500 MG PO TABS: 500.0000 mg | ORAL_TABLET | Freq: Two times a day (BID) | ORAL | Status: DC

## 2012-06-15 MED ORDER — OMEPRAZOLE 40 MG PO CPDR: 40.0000 mg | DELAYED_RELEASE_CAPSULE | Freq: Every day | ORAL | Status: DC

## 2012-06-15 NOTE — Telephone Encounter (Signed)
Ordered the refills that I saw were due

## 2012-06-20 NOTE — Telephone Encounter (Signed)
Many rings and no answer. Will mail a letter to call.

## 2012-06-28 ENCOUNTER — Telehealth: Payer: Self-pay | Admitting: Family Medicine

## 2012-06-28 ENCOUNTER — Other Ambulatory Visit: Payer: Self-pay

## 2012-06-28 ENCOUNTER — Telehealth: Payer: Self-pay

## 2012-06-28 DIAGNOSIS — Z139 Encounter for screening, unspecified: Secondary | ICD-10-CM

## 2012-06-28 DIAGNOSIS — F329 Major depressive disorder, single episode, unspecified: Secondary | ICD-10-CM

## 2012-06-28 NOTE — Telephone Encounter (Signed)
Daughter called to schedule her mom's colonoscopy. She answered the triage questions. Mom will have to call back with the med list and her insurance info. She is scheduled for 07/20/2012 with RMR at 12:30 PM.   Mom called back and gave the med list and insurance info.

## 2012-06-28 NOTE — Telephone Encounter (Signed)
Gastroenterology Pre-Procedure Form    Request Date: 06/28/2012     Requesting Physician: Dr. Lodema Hong     PATIENT INFORMATION:  [name] is a 71 y.o., female (DOB=[birth year]).  PROCEDURE: Procedure(s) requested: colonoscopy Procedure Reason: screening for colon cancer  PATIENT REVIEW QUESTIONS: The patient reports the following:   1. Diabetes Melitis: no 2. Joint replacements in the past 12 months: no 3. Major health problems in the past 3 months: no 4. Has an artificial valve or MVP:no 5. Has been advised in past to take antibiotics in advance of a procedure like teeth cleaning: no}    MEDICATIONS & ALLERGIES:    Patient reports the following regarding taking any blood thinners:   Plavix? no Aspirin?no Coumadin?  no  Patient confirms/reports the following medications:  Current Outpatient Prescriptions  Medication Sig Dispense Refill  . arformoterol (BROVANA) 15 MCG/2ML NEBU Take 2 mLs (15 mcg total) by nebulization 2 (two) times daily.  360 mL  1  . calcium-vitamin D (OSCAL WITH D) 500-200 MG-UNIT per tablet Take 1 tablet by mouth daily.       [name] FLUoxetine (PROZAC) 40 MG capsule Take 1 capsule (40 mg total) by mouth daily.  90 capsule  0  . Multiple Vitamins-Minerals (CEROVITE SENIOR) TABS Take by mouth daily.        . naproxen (NAPROSYN) 500 MG tablet Take 500 mg by mouth 1 day or 1 dose.      [name] omeprazole (PRILOSEC) 40 MG capsule Take 1 capsule (40 mg total) by mouth daily.  90 capsule  0  . verapamil (CALAN-SR) 240 MG CR tablet Take 1 tablet (240 mg total) by mouth at bedtime.  90 tablet  0  . vitamin C (ASCORBIC ACID) 500 MG tablet Take 1,000 mg by mouth daily.        [name] albuterol ([name] HFA;VENTOLIN HFA) 108 (90 BASE) MCG/ACT inhaler Inhale 2 puffs into the lungs every 4 (four) hours as needed for wheezing.  1 Inhaler  0  . HYDROcodone-acetaminophen (VICODIN) 5-500 MG per tablet Take 1 tablet by mouth every 4 (four) hours as needed.      . lovastatin  (MEVACOR) 40 MG tablet Take 40 mg by mouth at bedtime.          Patient confirms/reports the following allergies:  Allergies  Allergen Reactions  . Ciprofloxacin     REACTION: rash--? reaction to cipro  . Pravastatin Sodium     REACTION: nausea  . Sulfonamide Derivatives Other (See Comments)    unknown    Patient is appropriate to schedule for requested procedure(s): yes  AUTHORIZATION INFORMATION Primary Insurance:  ID #:   Group #:  Pre-Cert / Auth required:  Pre-Cert / Auth #:   Secondary Insurance:   ID #:   Group #:  Pre-Cert / Auth required:  Pre-Cert / Auth #:   No orders of the defined types were placed in this encounter.    SCHEDULE INFORMATION: Procedure has been scheduled as follows:  Date: 07/20/2012     Time: 12:30 PM  Location: [facility] Short Stay  This Gastroenterology Pre-Precedure Form is being routed to the following provider(s) for review: R. Roetta Sessions, MD

## 2012-06-28 NOTE — Telephone Encounter (Signed)
OK to schedule

## 2012-06-30 MED ORDER — FLUOXETINE HCL 40 MG PO CAPS: 40.0000 mg | ORAL_CAPSULE | Freq: Every day | ORAL | Status: DC

## 2012-06-30 NOTE — Telephone Encounter (Signed)
Sent in needed med

## 2012-07-04 MED ORDER — PEG 3350-KCL-NA BICARB-NACL 420 G PO SOLR: 4000.0000 L | ORAL | Status: AC

## 2012-07-04 NOTE — Telephone Encounter (Signed)
Rx sent to [facility] in [place]. Instructions mailed to pt.

## 2012-07-05 ENCOUNTER — Encounter (HOSPITAL_COMMUNITY): Payer: Self-pay | Admitting: Pharmacy Technician

## 2012-07-11 ENCOUNTER — Encounter: Payer: Self-pay | Admitting: Orthopedic Surgery

## 2012-07-11 ENCOUNTER — Ambulatory Visit (INDEPENDENT_AMBULATORY_CARE_PROVIDER_SITE_OTHER): Payer: Medicare PPO | Admitting: Orthopedic Surgery

## 2012-07-11 VITALS — BP 144/80 | Ht 73.0 in | Wt 240.0 lb

## 2012-07-11 DIAGNOSIS — S82843A Displaced bimalleolar fracture of unspecified lower leg, initial encounter for closed fracture: Secondary | ICD-10-CM

## 2012-07-11 NOTE — Progress Notes (Signed)
Patient ID: [name], female   DOB: 1941-04-09, 71 y.o.   MRN: [id number] Chief Complaint  Patient presents with  . Follow-up    Recheck left ankle.    BP 144/80  Ht 6\' 1"  (1.854 m)  Wt 240 lb (108.863 kg)  BMI 31.66 kg/m2  Recheck left ankle after open treatment internal fixation in March. She is doing well regained her ambulation. The ankle looks clean scarring around the ankle from blisters have recovered nicely no signs of infection adequate ankle motion  Routine activities post op left ankle open treatment internal fixation followup as needed

## 2012-07-11 NOTE — Patient Instructions (Signed)
activities as tolerated 

## 2012-07-20 ENCOUNTER — Encounter (HOSPITAL_COMMUNITY): Payer: Self-pay | Admitting: *Deleted

## 2012-07-20 ENCOUNTER — Ambulatory Visit (HOSPITAL_COMMUNITY)
Admission: RE | Admit: 2012-07-20 | Discharge: 2012-07-20 | Disposition: A | Discharge status: Home / Self Care | Payer: Medicare PPO | Source: Ambulatory Visit | Attending: Internal Medicine | Admitting: Internal Medicine

## 2012-07-20 ENCOUNTER — Encounter (HOSPITAL_COMMUNITY)
Admission: RE | Disposition: A | Discharge status: Home / Self Care | Payer: Self-pay | Source: Ambulatory Visit | Attending: Internal Medicine

## 2012-07-20 DIAGNOSIS — J4489 Other specified chronic obstructive pulmonary disease: Secondary | ICD-10-CM | POA: Insufficient documentation

## 2012-07-20 DIAGNOSIS — Z1211 Encounter for screening for malignant neoplasm of colon: Secondary | ICD-10-CM | POA: Insufficient documentation

## 2012-07-20 DIAGNOSIS — Z139 Encounter for screening, unspecified: Secondary | ICD-10-CM

## 2012-07-20 DIAGNOSIS — J449 Chronic obstructive pulmonary disease, unspecified: Secondary | ICD-10-CM | POA: Insufficient documentation

## 2012-07-20 DIAGNOSIS — I10 Essential (primary) hypertension: Secondary | ICD-10-CM | POA: Insufficient documentation

## 2012-07-20 DIAGNOSIS — D126 Benign neoplasm of colon, unspecified: Secondary | ICD-10-CM | POA: Insufficient documentation

## 2012-07-20 HISTORY — PX: COLONOSCOPY: SHX5424

## 2012-07-20 SURGERY — COLONOSCOPY
Anesthesia: Moderate Sedation

## 2012-07-20 MED ORDER — SODIUM CHLORIDE 0.9 % IJ SOLN
INTRAMUSCULAR | Status: DC | PRN
  Administered 2012-07-20 (×2): 10 mL

## 2012-07-20 MED ORDER — MEPERIDINE HCL 100 MG/ML IJ SOLN
INTRAMUSCULAR | Status: DC | PRN
  Administered 2012-07-20: 25 mg via INTRAVENOUS
  Administered 2012-07-20: 50 mg via INTRAVENOUS

## 2012-07-20 MED ORDER — SODIUM CHLORIDE 0.45 % IV SOLN
INTRAVENOUS | Status: DC
  Administered 2012-07-20: 11:00:00 via INTRAVENOUS

## 2012-07-20 MED ORDER — STERILE WATER FOR IRRIGATION IR SOLN
Status: DC | PRN
  Administered 2012-07-20: 12:00:00

## 2012-07-20 MED ORDER — SODIUM CHLORIDE 0.9 % IJ SOLN
INTRAMUSCULAR | Status: DC | PRN
  Administered 2012-07-20 (×2)

## 2012-07-20 MED ORDER — MEPERIDINE HCL 100 MG/ML IJ SOLN
INTRAMUSCULAR | Status: AC
  Filled 2012-07-20: qty 1

## 2012-07-20 MED ORDER — MIDAZOLAM HCL 5 MG/5ML IJ SOLN
INTRAMUSCULAR | Status: DC | PRN
  Administered 2012-07-20: 1 mg via INTRAVENOUS
  Administered 2012-07-20 (×2): 2 mg via INTRAVENOUS

## 2012-07-20 MED ORDER — MIDAZOLAM HCL 5 MG/5ML IJ SOLN
INTRAMUSCULAR | Status: AC
  Filled 2012-07-20: qty 10

## 2012-07-20 MED ORDER — EPINEPHRINE HCL 0.1 MG/ML IJ SOLN
INTRAMUSCULAR | Status: AC
  Filled 2012-07-20: qty 10

## 2012-07-20 NOTE — H&P (Signed)
Primary Care Physician:  Syliva Overman, MD Primary Gastroenterologist:  Dr. Jena Gauss  Pre-Procedure History & Physical: HPI:  [name] is a 71 y.o. female is here for a screening colonoscopy.   Past Medical History  Diagnosis Date  . Allergic rhinitis   . Anemia   . Anxiety   . Depression   . GERD (gastroesophageal reflux disease)   . Hypertension   . Low back pain   . Arthritis     abnormal gait   . OSA on CPAP   . Degenerative disc disease, lumbar     L5 nerve impingement   . 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 Arm fracture, left   . Onychomycosis   . Ankle fracture, right   . Hyperglycemia   . Mediastinal lymphadenopathy 1/9    resolving   . COPD (chronic obstructive pulmonary disease)     chronic CO2 retention, decreased DLCO   . Cystic acne     adult   . Sleep apnea     stop bang score 6    Past Surgical History  Procedure Date  . Abdominal hysterectomy 1976    secondary to bleeding   . Oophorectomy 1976  . Epidural steroids   . Orif ankle fracture 01/18/2012    Procedure: OPEN REDUCTION INTERNAL FIXATION (ORIF) ANKLE FRACTURE;  Surgeon: Fuller Canada, MD;  Location: [facility];  Service: Orthopedics;  Laterality: Left;    Prior to Admission medications   Medication Sig Start Date End Date Taking? Authorizing Provider  albuterol ([name] HFA;VENTOLIN HFA) 108 (90 BASE) MCG/ACT inhaler Inhale 2 puffs into the lungs every 6 (six) hours as needed. Shortness of Breath   Yes Historical Provider, MD  calcium-vitamin D (OSCAL WITH D) 500-200 MG-UNIT per tablet Take 1 tablet by mouth daily.    Yes Historical Provider, MD  FLUoxetine (PROZAC) 40 MG capsule Take 1 capsule (40 mg total) by mouth daily. 06/30/12  Yes Kerri Perches, MD  Multiple Vitamins-Minerals (CEROVITE SENIOR) TABS Take by mouth daily.     Yes Historical Provider, MD  naproxen (NAPROSYN) 500 MG tablet Take 500 mg by mouth daily.  06/15/12  Yes Kerri Perches, MD    omeprazole (PRILOSEC) 40 MG capsule Take 1 capsule (40 mg total) by mouth daily. 06/15/12  Yes Kerri Perches, MD  verapamil (CALAN-SR) 240 MG CR tablet Take 1 tablet (240 mg total) by mouth at bedtime. 06/15/12  Yes Kerri Perches, MD  vitamin C (ASCORBIC ACID) 500 MG tablet Take 500 mg by mouth daily.    Yes Historical Provider, MD  albuterol ([name] HFA;VENTOLIN HFA) 108 (90 BASE) MCG/ACT inhaler Inhale 2 puffs into the lungs every 4 (four) hours as needed for wheezing. 06/22/11 06/21/12  Joya Gaskins, MD  arformoterol (BROVANA) 15 MCG/2ML NEBU Take 2 mLs (15 mcg total) by nebulization 2 (two) times daily. 04/25/12   Kerri Perches, MD    Allergies as of 06/28/2012 - Review Complete 06/28/2012  Allergen Reaction Noted  . Ciprofloxacin    . Pravastatin sodium    . Sulfonamide derivatives Other (See Comments) 02/17/2010    Family History  Problem Relation Age of Onset  . Stomach cancer Father   . Breast cancer Father   . Cancer Father   . Cancer Sister   . Hypertension Son   . Cancer Mother   . Arthritis      History   Social History  . Marital Status: Married  Spouse Name: N/A    Number of Children: 4  . Years of Education: N/A   Occupational History  . employed in the tobacco industry     Social History Main Topics  . Smoking status: Former Smoker -- 1.0 packs/day for 40 years    Types: Cigarettes    Quit date: 06/21/2006  . Smokeless tobacco: Never Used  . Alcohol Use: No  . Drug Use: No  . Sexually Active: No   Other Topics Concern  . Not on file   Social History Narrative  . No narrative on file    Review of Systems: See HPI, otherwise negative ROS  Physical Exam: BP 131/82  Pulse 101  Temp 98 F (36.7 C) (Oral)  Resp 18  Ht 6\' 1"  (1.854 m)  Wt 240 lb (108.863 kg)  BMI 31.66 kg/m2  SpO2 92% General:   Alert,  Well-developed, well-nourished, pleasant and cooperative in NAD Head:  Normocephalic and atraumatic. Eyes:  Sclera clear,  no icterus.   Conjunctiva pink. Ears:  Normal auditory acuity. Nose:  No deformity, discharge,  or lesions. Mouth:  No deformity or lesions, dentition normal. Neck:  Supple; no masses or thyromegaly. Lungs:  Clear throughout to auscultation.   No wheezes, crackles, or rhonchi. No acute distress. Heart:  Regular rate and rhythm; no murmurs, clicks, rubs,  or gallops. Abdomen:  Soft, nontender and nondistended. No masses, hepatosplenomegaly or hernias noted. Normal bowel sounds, without guarding, and without rebound.   Msk:  Symmetrical without gross deformities. Normal posture. Pulses:  Normal pulses noted. Extremities:  Without clubbing or edema. Neurologic:  Alert and  oriented x4;  grossly normal neurologically. Skin:  Intact without significant lesions or rashes. Cervical Nodes:  No significant cervical adenopathy. Psych:  Alert and cooperative. Normal mood and affect.  Impression/Plan: [name] is now here to undergo a screening colonoscopy.  Last examination reportedly 10 years ago and negative. No bowel symptoms currently. No family history colon polyps or colon cancer.  Risks, benefits, limitations, imponderables and alternatives regarding colonoscopy have been reviewed with the patient. Questions have been answered. All parties agreeable.

## 2012-07-20 NOTE — Op Note (Signed)
[facility] [address] [place], [address]   COLONOSCOPY PROCEDURE REPORT  PATIENT: [name], [name]  MR#:         [id number] BIRTHDATE: 1941-06-06 , 70  yrs. old GENDER: Female ENDOSCOPIST: R.  Roetta Sessions, MD FACP FACG REFERRED BY:  Syliva Overman, M.D. PROCEDURE DATE:  07/20/2012 PROCEDURE:     colonoscopy with piecemeal saline assisted snare polypectomy, debulking and ablation ablation  INDICATIONS: average risk screening colonoscopy; last exam reportedly 10 years ago  INFORMED CONSENT:  The risks, benefits, alternatives and imponderables including but not limited to bleeding, perforation as well as the possibility of a missed lesion have been reviewed.  The potential for biopsy, lesion removal, etc. have also been discussed.  Questions have been answered.  All parties agreeable. Please see the history and physical in the medical record for more information.  MEDICATIONS: Versed 5 mg IV and Versed 5 mg IV in divided doses. 12 cc of 1 in 10,000 epinephrine  DESCRIPTION OF PROCEDURE:  After a digital rectal exam was performed, the EC-[id number] ([id number])  colonoscope was advanced from the anus through the rectum and colon to the area of the cecum, ileocecal valve and appendiceal orifice.  The cecum was deeply intubated.  These structures were well-seen and photographed for the record.  From the level of the cecum and ileocecal valve, the scope was slowly and cautiously withdrawn.  The mucosal surfaces were carefully surveyed utilizing scope tip deflection to facilitate fold flattening as needed.  The scope was pulled down into the rectum where a thorough examination including retroflexion was performed.    FINDINGS:  marginal preparation made the exam more challenging. The patient had a 5 mm  polyp in the rectum - pedunculated at 12 cm from the anal verge; otherwise,rectum appeared normal. The patient had multiple large polyps in the ascending  segment and cecum. These were sprawling "carpet polyps" - the largest being approximately 2 by 2 cm on the distal side of the ileocecal valve. ; there were smaller,5-59mm polyps, in the same segment. There were a couple of diminutive polyps in the same segment as well.There was a 1.25 cm pedunculated polyp at the hepatic flexure as well as multiple 5- 8mm pedunculated polyps in the sigmoid and descending segments.  THERAPEUTIC / DIAGNOSTIC MANEUVERS PERFORMED:  The large complex polyps in the cecum and  ascending segment were lifted away from the colonic wall with submucosal normal saline injection. Piecemeal polypectomy/debulking of these polyps followed. A couple of these polypectomy sites wanted to ooz.  A total of 12 cc 1:10,000 epinephrine was injected at the periphery of a couple of these polypectomy sites to achieve good hemostasis. Please note the 3 largest polyps in the cecum and ascending segment were largely removed/debulked, however,  but some residual polyp tissue remained. I did not feel that I could safely put further thermal energy in the colon wall on this side in this single setting.  The hepatic flexure, and left colon polyps removed with hot snare cautery technique. The rectal polyp mentioned above was removed with hot snare cautery but was not recovered at the conclusion of the procedure.  COMPLICATIONS: none  CECAL WITHDRAWAL TIME:  one hour and 8 minutes  IMPRESSION:  Multiple colonic polyps with particularly large polyps on the right side - status post saline-assisted debulking piecemeal polypectomy and ablation.. Not all of the large polyps on the right side were entirely removed.  RECOMMENDATIONS: Followup on pathology. If high-grade pathology existed in  any of the right colon polyps, she will need a right hemicolectomy.  I have advised no Naprosyn or other nonsteroidals/aspirin for the next 14 days.   _______________________________ eSigned:  R.  Roetta Sessions, MD FACP [facility] 07/20/2012 1:20 PM   CC:    PATIENT NAME:  [name], [name] MR#: [id number]

## 2012-07-24 ENCOUNTER — Encounter (HOSPITAL_COMMUNITY): Payer: Self-pay | Admitting: Internal Medicine

## 2012-07-25 ENCOUNTER — Ambulatory Visit (INDEPENDENT_AMBULATORY_CARE_PROVIDER_SITE_OTHER): Payer: Medicare PPO | Admitting: Family Medicine

## 2012-07-25 ENCOUNTER — Encounter: Payer: Self-pay | Admitting: Family Medicine

## 2012-07-25 VITALS — BP 122/68 | HR 94 | Resp 20 | Ht 73.0 in | Wt 242.1 lb

## 2012-07-25 DIAGNOSIS — I10 Essential (primary) hypertension: Secondary | ICD-10-CM

## 2012-07-25 DIAGNOSIS — J449 Chronic obstructive pulmonary disease, unspecified: Secondary | ICD-10-CM

## 2012-07-25 DIAGNOSIS — Z23 Encounter for immunization: Secondary | ICD-10-CM

## 2012-07-25 DIAGNOSIS — E785 Hyperlipidemia, unspecified: Secondary | ICD-10-CM

## 2012-07-25 NOTE — Patient Instructions (Addendum)
Continue current medications Flu shot given today  Get the labs done fasting in October F/U 4 months Dr. Lodema Hong

## 2012-07-26 ENCOUNTER — Encounter: Payer: Self-pay | Admitting: Family Medicine

## 2012-07-26 NOTE — Assessment & Plan Note (Signed)
Currently stable, on oxygen therapy

## 2012-07-26 NOTE — Assessment & Plan Note (Signed)
Unable to tolerate statin per report, advised her to try fish oil, recheck before next visit

## 2012-07-26 NOTE — Progress Notes (Signed)
  Subjective:    Patient ID: [name], female    DOB: 03-01-1941, 71 y.o.   MRN: [id number]  HPI Pt here to f/u chronic medical problems, doing well no concerns She is s/p treatment for left ankle fracture has been released by ortho Medications and history reviewed Due for Flu shot Breathing has been good   Review of Systems  GEN- denies fatigue, fever, weight loss,weakness, recent illness HEENT- denies eye drainage, change in vision, nasal discharge, CVS- denies chest pain, palpitations RESP- denies SOB, cough, wheeze ABD- denies N/V, change in stools, abd pain GU- denies dysuria, hematuria, dribbling, incontinence MSK- denies joint pain, muscle aches, injury Neuro- denies headache, dizziness, syncope, seizure activity      Objective:   Physical Exam GEN- NAD, alert and oriented x3, obese, uses rollator HEENT- PERRL, EOMI, non injected sclera, pink conjunctiva, MMM, oropharynx clear Neck- Supple, no thryomegaly CVS- RRR, no murmur RESP-CTAB, 2 L oxygen ABD-NABS,soft,NT,ND EXT- trace pedal edema L >R  edema Pulses- Radial, DP- 2+ Psych-normal affect and Mood        Assessment & Plan:

## 2012-07-26 NOTE — Assessment & Plan Note (Signed)
BP well controlled, no change to meds 

## 2012-07-27 ENCOUNTER — Ambulatory Visit: Payer: Medicare PPO | Admitting: Family Medicine

## 2012-07-31 NOTE — Progress Notes (Signed)
Patient is scheduled with Dr. Cherylann Ratel on Tuesday Sept 24th at 2:15 and patient is aware

## 2012-08-08 NOTE — H&P (Signed)
  NTS SOAP Note  Vital Signs:  Vitals as of: 08/08/2012: Systolic 111: Diastolic 67: Heart Rate 97: Temp 98.74F: Height 92ft 1in: Weight 243Lbs 0 Ounces: BMI 32  BMI : 32.06 kg/m2  Subjective: This 58 Years 11 Months old Female presents for of a dysplastic polyp in the ascending colon.  Could not be removed fully endoscopically.  Denies any GI complaints.  Review of Symptoms:  Constitutional:unremarkable   Head:unremarkable    Eyes:unremarkable   Nose/Mouth/Throat:unremarkable Cardiovascular:  unremarkable   Respiratory:  dyspnea,wheezing Gastrointestinal:  unremarkable   Genitourinary:unremarkable       joint pain Skin:unremarkable Hematolgic/Lymphatic:unremarkable     Allergic/Immunologic:unremarkable     Past Medical History:    Reviewed   Past Medical History  Surgical History: hysterectomy Medical Problems:  High Blood pressure, COPD Allergies: pravastatin, cipro Medications: albuterol, calcium, fluoxetine, omeprazole, verapamil 240, vit c   Social History:Reviewed  Social History  Preferred Language: English ([place]) Race:  Black or African American Ethnicity: Not Hispanic / Latino Age: 72 Years 10 Months Marital Status:  W Alcohol:  No Recreational drug(s):  No   Smoking Status: Never smoker reviewed on 08/08/2012  Family History:  Reviewed   Family History  Is there a family history [id number]    Objective Information: General:  Well appearing, well nourished in no distress. Head:Atraumatic; no masses; no abnormalities Neck:  Supple without lymphadenopathy.  Heart:  RRR, no murmur Lungs:    CTA bilaterally, no wheezes, rhonchi, rales.  Breathing unlabored.  Is on 2l o2 Abdomen:Soft, NT/ND, no HSM, no masses.  Assessment:Colon neoplasm  Diagnosis &amp; Procedure: DiagnosisCode: 153.6, ProcedureCode: 78295,    Plan:Scheduled for laparoscopic handassisted partial  colectomy on 08/18/12.  Patient knows she is at higher risk due to COPD.  May need to be on vent postop.   Patient Education:Alternative treatments to surgery were discussed with patient (and family).  Risks and benefits  of procedure were fully explained to the patient (and family) who gave informed consent. Patient/family questions were addressed.  Follow-up:Pending Surgery

## 2012-08-11 ENCOUNTER — Encounter (HOSPITAL_COMMUNITY): Payer: Self-pay | Admitting: Pharmacy Technician

## 2012-08-14 ENCOUNTER — Encounter (HOSPITAL_COMMUNITY): Payer: Self-pay

## 2012-08-14 ENCOUNTER — Other Ambulatory Visit: Payer: Self-pay

## 2012-08-14 ENCOUNTER — Encounter (HOSPITAL_COMMUNITY)
Admission: RE | Admit: 2012-08-14 | Discharge: 2012-08-14 | Disposition: A | Discharge status: Home / Self Care | Payer: Medicare PPO | Source: Ambulatory Visit | Attending: General Surgery | Admitting: General Surgery

## 2012-08-14 ENCOUNTER — Ambulatory Visit (HOSPITAL_COMMUNITY)
Admission: RE | Admit: 2012-08-14 | Discharge: 2012-08-14 | Disposition: A | Discharge status: Home / Self Care | Payer: Medicare PPO | Source: Ambulatory Visit | Attending: General Surgery | Admitting: General Surgery

## 2012-08-14 LAB — BLOOD GAS, ARTERIAL
FIO2: 0.28 %
Patient temperature: 37
TCO2: 28.6 mmol/L (ref 0–100)
pCO2 arterial: 51.8 mmHg — ABNORMAL HIGH (ref 35.0–45.0)
pH, Arterial: 7.397 (ref 7.350–7.450)

## 2012-08-14 LAB — COMPREHENSIVE METABOLIC PANEL
AST: 18 U/L (ref 0–37)
Albumin: 3.9 g/dL (ref 3.5–5.2)
Alkaline Phosphatase: 91 U/L (ref 39–117)
BUN: 17 mg/dL (ref 6–23)
Chloride: 98 mEq/L (ref 96–112)
Potassium: 3.6 mEq/L (ref 3.5–5.1)
Sodium: 139 mEq/L (ref 135–145)
Total Bilirubin: 0.3 mg/dL (ref 0.3–1.2)
Total Protein: 7.1 g/dL (ref 6.0–8.3)

## 2012-08-14 LAB — CBC WITH DIFFERENTIAL/PLATELET
Basophils Absolute: 0 10*3/uL (ref 0.0–0.1)
Basophils Relative: 0 % (ref 0–1)
HCT: 36.8 % (ref 36.0–46.0)
Lymphocytes Relative: 23 % (ref 12–46)
MCHC: 30.7 g/dL (ref 30.0–36.0)
Monocytes Absolute: 0.3 10*3/uL (ref 0.1–1.0)
Neutro Abs: 3.8 10*3/uL (ref 1.7–7.7)
Neutrophils Relative %: 69 % (ref 43–77)
Platelets: 280 10*3/uL (ref 150–400)
RDW: 14.8 % (ref 11.5–15.5)
WBC: 5.5 10*3/uL (ref 4.0–10.5)

## 2012-08-14 LAB — SURGICAL PCR SCREEN: Staphylococcus aureus: NEGATIVE

## 2012-08-14 LAB — PREPARE RBC (CROSSMATCH)

## 2012-08-14 NOTE — Patient Instructions (Addendum)
20 [name]  08/14/2012   Your procedure is scheduled on:  08/18/2012  Report to [facility] at  800  AM.  Call this number if you have problems the morning of surgery: [phone]   Remember:   Do not eat food:After Midnight.  May have clear liquids:until Midnight .    Take these medicines the morning of surgery with A SIP OF WATER:  Prilosec,prozac,verapamil   Do not wear jewelry, make-up or nail polish.  Do not wear lotions, powders, or perfumes. You may wear deodorant.  Do not shave 48 hours prior to surgery. Men may shave face and neck.  Do not bring valuables to the hospital.  Contacts, dentures or bridgework may not be worn into surgery.  Leave suitcase in the car. After surgery it may be brought to your room.  For patients admitted to the hospital, checkout time is 11:00 AM the day of discharge.   Patients discharged the day of surgery will not be allowed to drive home.  Name and phone number of your driver: family  Special Instructions: Shower using CHG 2 nights before surgery and the night before surgery.  If you shower the day of surgery use CHG.  Use special wash - you have one bottle of CHG for all showers.  You should use approximately 1/3 of the bottle for each shower.   Please read over the following fact sheets that you were given: Pain Booklet, MRSA Information, Surgical Site Infection Prevention, Anesthesia Post-op Instructions and Care and Recovery After Surgery Removal of Colon (Colectomy) Care Before and After Surgery A partial or total colectomy is the removal of part or all of your colon. This is most often done under a general anesthetic so that you are sleeping during the procedure. Following the procedure, you will be taken to a recovery room. Once you have recovered from the anesthetic you will be returned to your room.  LET YOUR CAREGIVERS KNOW ABOUT:  Allergies   Medications taken including herbs, eye drops, over the counter medications, and  creams.   Use of steroids (by mouth or creams).   Previous problems with anesthetics or Novocaine.   Possibility of pregnancy, if this applies.   History of blood clots (thrombophlebitis).   History of bleeding or blood problems.   Previous surgery.   Other health problems.  AFTER THE PROCEDURE After surgery, you will be taken to the recovery area where a nurse will watch you and check your progress. After the recovery area you will go to your hospital room. Your surgeon will determine when it is alright for you to take fluids and foods. You will be given pain medicine to keep you comfortable. HOME CARE INSTRUCTIONS  Once home, an ice pack applied to the operative site may help with discomfort and keep swelling down.   Change dressings as directed.   Only take over-the-counter or prescription medicines for pain, discomfort, or fever as directed by your caregiver.   You may continue normal diet and activities as directed.   There should be no heavy lifting (more than 10 pounds), strenuous activities or contact sports for three weeks, or as directed.   Keep the wound dry and clean. The wound may be washed gently with soap and water. Gently blot or dab dry following cleansing without rubbing. Do not take baths, use swimming pools or use hot tubs for ten days, or as instructed by your caregivers.   If you have a colostomy, care  for it as you have been shown.  SEEK MEDICAL CARE IF:   There is redness, swelling, or increasing pain in the wound area.   Pus is coming from the wound.   An unexplained oral temperature above 101 F (38.3 C) develops.   You notice a foul smell coming from the wound or dressing.   There is a breaking open of a wound (edges not staying together) after the sutures have been removed.   There is increasing abdominal pain.  SEEK IMMEDIATE MEDICAL CARE IF:   A rash develops.   There is difficulty breathing, or development of a reaction or side effects  to medications given.  Document Released: 08/05/2004 Document Revised: 10/21/2011 Document Reviewed: 12/05/2007 [facility] Patient Information 2012 [place], [place].PATIENT INSTRUCTIONS POST-ANESTHESIA  IMMEDIATELY FOLLOWING SURGERY:  Do not drive or operate machinery for the first twenty four hours after surgery.  Do not make any important decisions for twenty four hours after surgery or while taking narcotic pain medications or sedatives.  If you develop intractable nausea and vomiting or a severe headache please notify your doctor immediately.  FOLLOW-UP:  Please make an appointment with your surgeon as instructed. You do not need to follow up with anesthesia unless specifically instructed to do so.  WOUND CARE INSTRUCTIONS (if applicable):  Keep a dry clean dressing on the anesthesia/puncture wound site if there is drainage.  Once the wound has quit draining you may leave it open to air.  Generally you should leave the bandage intact for twenty four hours unless there is drainage.  If the epidural site drains for more than 36-48 hours please call the anesthesia department.  QUESTIONS?:  Please feel free to call your physician or the hospital operator if you have any questions, and they will be happy to assist you.

## 2012-08-14 NOTE — Pre-Procedure Instructions (Signed)
ABG results called to Dr Lovell Sheehan. No orders given.

## 2012-08-15 LAB — CEA: CEA: 1 ng/mL (ref 0.0–5.0)

## 2012-08-16 NOTE — Pre-Procedure Instructions (Signed)
Dr Jayme Cloud aware of ABG. No new orders given.

## 2012-08-18 ENCOUNTER — Encounter (HOSPITAL_COMMUNITY): Payer: Self-pay | Admitting: Anesthesiology

## 2012-08-18 ENCOUNTER — Encounter (HOSPITAL_COMMUNITY): Payer: Self-pay | Admitting: *Deleted

## 2012-08-18 ENCOUNTER — Inpatient Hospital Stay (HOSPITAL_COMMUNITY)
Admission: RE | Admit: 2012-08-18 | Discharge: 2012-08-23 | DRG: 331 | Disposition: A | Discharge status: Home / Self Care | Payer: Medicare PPO | Source: Ambulatory Visit | Attending: General Surgery | Admitting: General Surgery

## 2012-08-18 ENCOUNTER — Ambulatory Visit (HOSPITAL_COMMUNITY): Payer: Medicare PPO | Admitting: Anesthesiology

## 2012-08-18 ENCOUNTER — Encounter (HOSPITAL_COMMUNITY)
Admission: RE | Disposition: A | Discharge status: Home / Self Care | Payer: Self-pay | Source: Ambulatory Visit | Attending: General Surgery

## 2012-08-18 DIAGNOSIS — Z90710 Acquired absence of both cervix and uterus: Secondary | ICD-10-CM

## 2012-08-18 DIAGNOSIS — R5381 Other malaise: Secondary | ICD-10-CM | POA: Diagnosis not present

## 2012-08-18 DIAGNOSIS — Z881 Allergy status to other antibiotic agents status: Secondary | ICD-10-CM

## 2012-08-18 DIAGNOSIS — J449 Chronic obstructive pulmonary disease, unspecified: Secondary | ICD-10-CM | POA: Diagnosis present

## 2012-08-18 DIAGNOSIS — Z888 Allergy status to other drugs, medicaments and biological substances status: Secondary | ICD-10-CM

## 2012-08-18 DIAGNOSIS — Z79899 Other long term (current) drug therapy: Secondary | ICD-10-CM

## 2012-08-18 DIAGNOSIS — Z9981 Dependence on supplemental oxygen: Secondary | ICD-10-CM

## 2012-08-18 DIAGNOSIS — I10 Essential (primary) hypertension: Secondary | ICD-10-CM | POA: Diagnosis present

## 2012-08-18 DIAGNOSIS — G8929 Other chronic pain: Secondary | ICD-10-CM | POA: Diagnosis present

## 2012-08-18 DIAGNOSIS — J4489 Other specified chronic obstructive pulmonary disease: Secondary | ICD-10-CM | POA: Diagnosis present

## 2012-08-18 DIAGNOSIS — M479 Spondylosis, unspecified: Secondary | ICD-10-CM | POA: Diagnosis present

## 2012-08-18 DIAGNOSIS — K219 Gastro-esophageal reflux disease without esophagitis: Secondary | ICD-10-CM | POA: Diagnosis present

## 2012-08-18 DIAGNOSIS — F329 Major depressive disorder, single episode, unspecified: Secondary | ICD-10-CM | POA: Diagnosis present

## 2012-08-18 DIAGNOSIS — F411 Generalized anxiety disorder: Secondary | ICD-10-CM | POA: Diagnosis present

## 2012-08-18 DIAGNOSIS — D126 Benign neoplasm of colon, unspecified: Principal | ICD-10-CM | POA: Diagnosis present

## 2012-08-18 DIAGNOSIS — G4730 Sleep apnea, unspecified: Secondary | ICD-10-CM | POA: Diagnosis present

## 2012-08-18 DIAGNOSIS — F3289 Other specified depressive episodes: Secondary | ICD-10-CM | POA: Diagnosis present

## 2012-08-18 HISTORY — PX: COLON RESECTION: SHX5231

## 2012-08-18 SURGERY — COLECTOMY, HAND-ASSISTED, LAPAROSCOPIC
Anesthesia: General | Site: Abdomen | Wound class: Clean Contaminated

## 2012-08-18 MED ORDER — ONDANSETRON HCL 4 MG/2ML IJ SOLN: 4.0000 mg | Freq: Four times a day (QID) | INTRAMUSCULAR | Status: DC | PRN

## 2012-08-18 MED ORDER — GLYCOPYRROLATE 0.2 MG/ML IJ SOLN
0.2000 mg | Freq: Once | INTRAMUSCULAR | Status: AC
  Administered 2012-08-18: 0.2 mg via INTRAVENOUS

## 2012-08-18 MED ORDER — PROPOFOL 10 MG/ML IV EMUL
INTRAVENOUS | Status: AC
  Filled 2012-08-18: qty 20

## 2012-08-18 MED ORDER — LIDOCAINE HCL (PF) 1 % IJ SOLN
INTRAMUSCULAR | Status: AC
  Filled 2012-08-18: qty 5

## 2012-08-18 MED ORDER — LACTATED RINGERS IV SOLN
INTRAVENOUS | Status: DC
  Administered 2012-08-18 (×2): via INTRAVENOUS
  Administered 2012-08-18: 125 mL/h via INTRAVENOUS
  Administered 2012-08-19 – 2012-08-20 (×4): via INTRAVENOUS

## 2012-08-18 MED ORDER — GLYCOPYRROLATE 0.2 MG/ML IJ SOLN
INTRAMUSCULAR | Status: AC
  Filled 2012-08-18: qty 2

## 2012-08-18 MED ORDER — BUPIVACAINE HCL (PF) 0.5 % IJ SOLN
INTRAMUSCULAR | Status: AC
  Filled 2012-08-18: qty 30

## 2012-08-18 MED ORDER — ENOXAPARIN SODIUM 40 MG/0.4ML ~~LOC~~ SOLN
40.0000 mg | Freq: Once | SUBCUTANEOUS | Status: AC
  Administered 2012-08-18: 40 mg via SUBCUTANEOUS

## 2012-08-18 MED ORDER — MIDAZOLAM HCL 2 MG/2ML IJ SOLN
INTRAMUSCULAR | Status: AC
  Filled 2012-08-18: qty 2

## 2012-08-18 MED ORDER — DEXAMETHASONE SODIUM PHOSPHATE 4 MG/ML IJ SOLN
4.0000 mg | Freq: Once | INTRAMUSCULAR | Status: AC
  Administered 2012-08-18: 4 mg via INTRAVENOUS

## 2012-08-18 MED ORDER — ONDANSETRON HCL 4 MG/2ML IJ SOLN: 4.0000 mg | Freq: Once | INTRAMUSCULAR | Status: DC | PRN

## 2012-08-18 MED ORDER — LIDOCAINE HCL (CARDIAC) 10 MG/ML IV SOLN
INTRAVENOUS | Status: DC | PRN
  Administered 2012-08-18: 50 mg via INTRAVENOUS

## 2012-08-18 MED ORDER — IPRATROPIUM-ALBUTEROL 18-103 MCG/ACT IN AERO
INHALATION_SPRAY | RESPIRATORY_TRACT | Status: DC | PRN
  Administered 2012-08-18 (×2): 2 via RESPIRATORY_TRACT

## 2012-08-18 MED ORDER — POVIDONE-IODINE 10 % OINT PACKET
TOPICAL_OINTMENT | CUTANEOUS | Status: DC | PRN
  Administered 2012-08-18: 2 via TOPICAL

## 2012-08-18 MED ORDER — ARFORMOTEROL TARTRATE 15 MCG/2ML IN NEBU
15.0000 ug | INHALATION_SOLUTION | Freq: Two times a day (BID) | RESPIRATORY_TRACT | Status: DC
  Administered 2012-08-19 – 2012-08-23 (×9): 15 ug via RESPIRATORY_TRACT
  Filled 2012-08-18 (×14): qty 2

## 2012-08-18 MED ORDER — FENTANYL CITRATE 0.05 MG/ML IJ SOLN
INTRAMUSCULAR | Status: AC
  Filled 2012-08-18: qty 5

## 2012-08-18 MED ORDER — GLYCOPYRROLATE 0.2 MG/ML IJ SOLN
INTRAMUSCULAR | Status: DC | PRN
  Administered 2012-08-18: 0.6 mg via INTRAVENOUS

## 2012-08-18 MED ORDER — CHLORHEXIDINE GLUCONATE 4 % EX LIQD: 1.0000 "application " | Freq: Once | CUTANEOUS | Status: DC

## 2012-08-18 MED ORDER — MIDAZOLAM HCL 2 MG/2ML IJ SOLN
1.0000 mg | INTRAMUSCULAR | Status: DC | PRN
  Administered 2012-08-18: 2 mg via INTRAVENOUS

## 2012-08-18 MED ORDER — ROCURONIUM BROMIDE 50 MG/5ML IV SOLN
INTRAVENOUS | Status: AC
  Filled 2012-08-18: qty 1

## 2012-08-18 MED ORDER — HYDROMORPHONE HCL PF 1 MG/ML IJ SOLN
1.0000 mg | INTRAMUSCULAR | Status: DC | PRN
  Administered 2012-08-18 – 2012-08-20 (×2): 1 mg via INTRAVENOUS
  Filled 2012-08-18: qty 15
  Filled 2012-08-18: qty 10
  Filled 2012-08-18: qty 1
  Filled 2012-08-18: qty 10
  Filled 2012-08-18: qty 1
  Filled 2012-08-18: qty 15
  Filled 2012-08-18: qty 5
  Filled 2012-08-18: qty 15

## 2012-08-18 MED ORDER — HEMOSTATIC AGENTS (NO CHARGE) OPTIME
TOPICAL | Status: DC | PRN
  Administered 2012-08-18 (×2): 1 via TOPICAL

## 2012-08-18 MED ORDER — PROPOFOL 10 MG/ML IV EMUL
INTRAVENOUS | Status: DC | PRN
  Administered 2012-08-18: 150 mg via INTRAVENOUS

## 2012-08-18 MED ORDER — ALBUTEROL SULFATE HFA 108 (90 BASE) MCG/ACT IN AERS
INHALATION_SPRAY | RESPIRATORY_TRACT | Status: AC
  Filled 2012-08-18: qty 6.7

## 2012-08-18 MED ORDER — FLUOXETINE HCL 20 MG PO CAPS
40.0000 mg | ORAL_CAPSULE | Freq: Every day | ORAL | Status: DC
  Administered 2012-08-18 – 2012-08-23 (×6): 40 mg via ORAL
  Filled 2012-08-18 (×2): qty 1
  Filled 2012-08-18 (×4): qty 2
  Filled 2012-08-18 (×2): qty 1

## 2012-08-18 MED ORDER — ACETAMINOPHEN 10 MG/ML IV SOLN
INTRAVENOUS | Status: AC
  Filled 2012-08-18: qty 100

## 2012-08-18 MED ORDER — SUCCINYLCHOLINE CHLORIDE 20 MG/ML IJ SOLN
INTRAMUSCULAR | Status: DC | PRN
  Administered 2012-08-18: 120 mg via INTRAVENOUS

## 2012-08-18 MED ORDER — ENOXAPARIN SODIUM 40 MG/0.4ML ~~LOC~~ SOLN
SUBCUTANEOUS | Status: AC
  Filled 2012-08-18: qty 0.4

## 2012-08-18 MED ORDER — LACTATED RINGERS IV SOLN
INTRAVENOUS | Status: DC
  Administered 2012-08-18: 09:00:00 via INTRAVENOUS

## 2012-08-18 MED ORDER — VERAPAMIL HCL ER 240 MG PO TBCR
240.0000 mg | EXTENDED_RELEASE_TABLET | Freq: Every day | ORAL | Status: DC
  Administered 2012-08-18 – 2012-08-22 (×5): 240 mg via ORAL
  Filled 2012-08-18 (×5): qty 1

## 2012-08-18 MED ORDER — ENOXAPARIN SODIUM 40 MG/0.4ML ~~LOC~~ SOLN
40.0000 mg | SUBCUTANEOUS | Status: DC
  Administered 2012-08-19 – 2012-08-23 (×5): 40 mg via SUBCUTANEOUS
  Filled 2012-08-18 (×6): qty 0.4

## 2012-08-18 MED ORDER — SODIUM CHLORIDE 0.9 % IR SOLN
Status: DC | PRN
  Administered 2012-08-18: 2000 mL

## 2012-08-18 MED ORDER — PHENYLEPHRINE HCL 10 MG/ML IJ SOLN
INTRAMUSCULAR | Status: DC | PRN
  Administered 2012-08-18 (×2): 100 ug via INTRAVENOUS

## 2012-08-18 MED ORDER — ONDANSETRON HCL 4 MG/2ML IJ SOLN
INTRAMUSCULAR | Status: AC
  Filled 2012-08-18: qty 2

## 2012-08-18 MED ORDER — NEOSTIGMINE METHYLSULFATE 1 MG/ML IJ SOLN
INTRAMUSCULAR | Status: AC
  Filled 2012-08-18: qty 10

## 2012-08-18 MED ORDER — PHENYLEPHRINE HCL 10 MG/ML IJ SOLN
INTRAMUSCULAR | Status: AC
  Filled 2012-08-18: qty 1

## 2012-08-18 MED ORDER — ONDANSETRON HCL 4 MG/2ML IJ SOLN
4.0000 mg | Freq: Once | INTRAMUSCULAR | Status: AC
  Administered 2012-08-18: 4 mg via INTRAVENOUS

## 2012-08-18 MED ORDER — SODIUM CHLORIDE 0.9 % IR SOLN
Status: DC | PRN
  Administered 2012-08-18: 3000 mL

## 2012-08-18 MED ORDER — SODIUM CHLORIDE 0.9 % IV SOLN
1.0000 g | INTRAVENOUS | Status: DC | PRN
  Administered 2012-08-18: 1 g via INTRAVENOUS

## 2012-08-18 MED ORDER — FENTANYL CITRATE 0.05 MG/ML IJ SOLN: 25.0000 ug | INTRAMUSCULAR | Status: DC | PRN

## 2012-08-18 MED ORDER — EPHEDRINE SULFATE 50 MG/ML IJ SOLN
INTRAMUSCULAR | Status: AC
  Filled 2012-08-18: qty 1

## 2012-08-18 MED ORDER — ALVIMOPAN 12 MG PO CAPS
12.0000 mg | ORAL_CAPSULE | Freq: Two times a day (BID) | ORAL | Status: DC
  Administered 2012-08-19 – 2012-08-20 (×3): 12 mg via ORAL
  Filled 2012-08-18 (×4): qty 1

## 2012-08-18 MED ORDER — BUPIVACAINE HCL (PF) 0.5 % IJ SOLN
INTRAMUSCULAR | Status: DC | PRN
  Administered 2012-08-18: 10 mL

## 2012-08-18 MED ORDER — SUCCINYLCHOLINE CHLORIDE 20 MG/ML IJ SOLN
INTRAMUSCULAR | Status: AC
  Filled 2012-08-18: qty 1

## 2012-08-18 MED ORDER — ALVIMOPAN 12 MG PO CAPS
12.0000 mg | ORAL_CAPSULE | Freq: Once | ORAL | Status: AC
  Administered 2012-08-18: 12 mg via ORAL

## 2012-08-18 MED ORDER — EPHEDRINE SULFATE 50 MG/ML IJ SOLN
INTRAMUSCULAR | Status: DC | PRN
  Administered 2012-08-18: 5 mg via INTRAVENOUS

## 2012-08-18 MED ORDER — ALVIMOPAN 12 MG PO CAPS
ORAL_CAPSULE | ORAL | Status: AC
  Filled 2012-08-18: qty 1

## 2012-08-18 MED ORDER — ROCURONIUM BROMIDE 100 MG/10ML IV SOLN
INTRAVENOUS | Status: DC | PRN
  Administered 2012-08-18: 10 mg via INTRAVENOUS
  Administered 2012-08-18: 30 mg via INTRAVENOUS
  Administered 2012-08-18: 10 mg via INTRAVENOUS

## 2012-08-18 MED ORDER — ONDANSETRON HCL 4 MG PO TABS: 4.0000 mg | ORAL_TABLET | Freq: Four times a day (QID) | ORAL | Status: DC | PRN

## 2012-08-18 MED ORDER — DEXAMETHASONE SODIUM PHOSPHATE 4 MG/ML IJ SOLN
INTRAMUSCULAR | Status: AC
  Filled 2012-08-18: qty 1

## 2012-08-18 MED ORDER — NEOSTIGMINE METHYLSULFATE 1 MG/ML IJ SOLN
INTRAMUSCULAR | Status: DC | PRN
  Administered 2012-08-18: 2 mg via INTRAVENOUS

## 2012-08-18 MED ORDER — ERTAPENEM SODIUM 1 G IJ SOLR: 1.0000 g | INTRAMUSCULAR | Status: DC

## 2012-08-18 MED ORDER — POVIDONE-IODINE 10 % EX OINT
TOPICAL_OINTMENT | CUTANEOUS | Status: AC
  Filled 2012-08-18: qty 2

## 2012-08-18 MED ORDER — FENTANYL CITRATE 0.05 MG/ML IJ SOLN
INTRAMUSCULAR | Status: DC | PRN
  Administered 2012-08-18 (×2): 100 ug via INTRAVENOUS

## 2012-08-18 MED ORDER — SODIUM CHLORIDE 0.9 % IV SOLN
INTRAVENOUS | Status: AC
  Filled 2012-08-18: qty 1

## 2012-08-18 MED ORDER — GLYCOPYRROLATE 0.2 MG/ML IJ SOLN
INTRAMUSCULAR | Status: AC
  Filled 2012-08-18: qty 1

## 2012-08-18 MED ORDER — LACTATED RINGERS IV SOLN
INTRAVENOUS | Status: DC | PRN
  Administered 2012-08-18 (×3): via INTRAVENOUS

## 2012-08-18 MED ORDER — ACETAMINOPHEN 10 MG/ML IV SOLN
1000.0000 mg | Freq: Four times a day (QID) | INTRAVENOUS | Status: AC
  Administered 2012-08-18 – 2012-08-19 (×4): 1000 mg via INTRAVENOUS
  Filled 2012-08-18 (×3): qty 100

## 2012-08-18 SURGICAL SUPPLY — 73 items
BAG HAMPER (MISCELLANEOUS) ×2 IMPLANT
BLADE SURG SZ10 CARB STEEL (BLADE) ×2 IMPLANT
CLOTH BEACON ORANGE TIMEOUT ST (SAFETY) ×2 IMPLANT
COVER LIGHT HANDLE STERIS (MISCELLANEOUS) ×4 IMPLANT
CUTTER ENDO LINEAR 45M (STAPLE) IMPLANT
DECANTER SPIKE VIAL GLASS SM (MISCELLANEOUS) ×2 IMPLANT
DRAPE INCISE IOBAN 44X35 STRL (DRAPES) ×2 IMPLANT
DRAPE WARM FLUID 44X44 (DRAPE) ×2 IMPLANT
DURAPREP 26ML APPLICATOR (WOUND CARE) ×2 IMPLANT
ELECT BLADE 6 FLAT ULTRCLN (ELECTRODE) ×2 IMPLANT
ELECT REM PT RETURN 9FT ADLT (ELECTROSURGICAL) ×2
ELECTRODE REM PT RTRN 9FT ADLT (ELECTROSURGICAL) ×1 IMPLANT
FILTER SMOKE EVAC LAPAROSHD (FILTER) IMPLANT
GLOVE BIO SURGEON STRL SZ7.5 (GLOVE) ×2 IMPLANT
GLOVE BIOGEL PI IND STRL 7.0 (GLOVE) ×2 IMPLANT
GLOVE BIOGEL PI IND STRL 7.5 (GLOVE) ×1 IMPLANT
GLOVE BIOGEL PI INDICATOR 7.0 (GLOVE) ×2
GLOVE BIOGEL PI INDICATOR 7.5 (GLOVE) ×1
GLOVE ECLIPSE 6.5 STRL STRAW (GLOVE) ×8 IMPLANT
GLOVE ECLIPSE 7.0 STRL STRAW (GLOVE) ×4 IMPLANT
GLOVE ECLIPSE 8.0 STRL XLNG CF (GLOVE) ×4 IMPLANT
GLOVE EXAM NITRILE MD LF STRL (GLOVE) ×2 IMPLANT
GOWN STRL REIN XL XLG (GOWN DISPOSABLE) ×8 IMPLANT
INST SET LAPROSCOPIC AP (KITS) ×2 IMPLANT
INST SET MAJOR GENERAL (KITS) ×2 IMPLANT
IV NS IRRIG 3000ML ARTHROMATIC (IV SOLUTION) ×2 IMPLANT
KIT BLADEGUARD II DBL (SET/KITS/TRAYS/PACK) ×2 IMPLANT
KIT ROOM TURNOVER AP CYSTO (KITS) IMPLANT
LIGASURE 5MM LAPAROSCOPIC (INSTRUMENTS) IMPLANT
LIGASURE IMPACT 36 18CM CVD LR (INSTRUMENTS) ×2 IMPLANT
LIGASURE LAP ATLAS 10MM 37CM (INSTRUMENTS) ×2 IMPLANT
MANIFOLD NEPTUNE II (INSTRUMENTS) ×2 IMPLANT
NEEDLE HYPO 18GX1.5 BLUNT FILL (NEEDLE) ×2 IMPLANT
NS IRRIG 1000ML POUR BTL (IV SOLUTION) ×4 IMPLANT
PACK LAP CHOLE LZT030E (CUSTOM PROCEDURE TRAY) ×2 IMPLANT
PAD ARMBOARD 7.5X6 YLW CONV (MISCELLANEOUS) ×2 IMPLANT
PENCIL HANDSWITCHING (ELECTRODE) ×2 IMPLANT
RELOAD LINEAR CUT PROX 55 BLUE (ENDOMECHANICALS) IMPLANT
RELOAD PROXIMATE 75MM BLUE (ENDOMECHANICALS) ×4 IMPLANT
RELOAD STAPLE TA45 3.5 REG BLU (ENDOMECHANICALS) IMPLANT
SEALER TISSUE G2 CVD JAW 35 (ENDOMECHANICALS) IMPLANT
SEALER TISSUE G2 CVD JAW 45CM (ENDOMECHANICALS)
SET BASIN LINEN APH (SET/KITS/TRAYS/PACK) ×2 IMPLANT
SET TUBE IRRIG SUCTION NO TIP (IRRIGATION / IRRIGATOR) ×2 IMPLANT
SHEET LAVH (DRAPES) ×2 IMPLANT
SPONGE GAUZE 2X2 8PLY STRL LF (GAUZE/BANDAGES/DRESSINGS) ×4 IMPLANT
SPONGE GAUZE 4X4 12PLY (GAUZE/BANDAGES/DRESSINGS) ×2 IMPLANT
SPONGE LAP 18X18 X RAY DECT (DISPOSABLE) ×4 IMPLANT
STAPLER GUN LINEAR PROX 60 (STAPLE) ×2 IMPLANT
STAPLER PROXIMATE 55 BLUE (STAPLE) IMPLANT
STAPLER PROXIMATE 75MM BLUE (STAPLE) ×2 IMPLANT
STAPLER VISISTAT (STAPLE) ×2 IMPLANT
STAPLER VISISTAT 35W (STAPLE) ×2 IMPLANT
SUCTION POOLE TIP (SUCTIONS) ×2 IMPLANT
SUT CHROMIC 0 CT 1 (SUTURE) ×2 IMPLANT
SUT CHROMIC 2 0 SH (SUTURE) ×2 IMPLANT
SUT PDS AB CT VIOLET #0 27IN (SUTURE) ×4 IMPLANT
SUT SILK 2 0 (SUTURE)
SUT SILK 2-0 18XBRD TIE 12 (SUTURE) IMPLANT
SUT SILK 3 0 SH CR/8 (SUTURE) ×4 IMPLANT
SUT VIC AB 0 CT1 27 (SUTURE)
SUT VIC AB 0 CT1 27XCR 8 STRN (SUTURE) IMPLANT
SUT VIC AB 2-0 CT2 27 (SUTURE) IMPLANT
SUT VICRYL 0 UR6 27IN ABS (SUTURE) ×2 IMPLANT
SYS LAPSCP GELPORT 120MM (MISCELLANEOUS) ×2
SYSTEM LAPSCP GELPORT 120MM (MISCELLANEOUS) ×1 IMPLANT
TAPE CLOTH SURG 4X10 WHT LF (GAUZE/BANDAGES/DRESSINGS) ×2 IMPLANT
TRAY FOLEY CATH 14FR (SET/KITS/TRAYS/PACK) ×2 IMPLANT
TROCAR Z-THRD FIOS HNDL 11X100 (TROCAR) ×2 IMPLANT
TROCAR Z-THREAD SLEEVE 11X100 (TROCAR) ×2 IMPLANT
TUBING HI FLO HEAT INSUFFLATOR (IRRIGATION / IRRIGATOR) ×2 IMPLANT
WARMER LAPAROSCOPE (MISCELLANEOUS) ×2 IMPLANT
YANKAUER SUCT BULB TIP 10FT TU (MISCELLANEOUS) ×2 IMPLANT

## 2012-08-18 NOTE — Transfer of Care (Signed)
Immediate Anesthesia Transfer of Care Note  Patient: [name]  Procedure(s) Performed: Procedure(s) (LRB): HAND ASSISTED LAPAROSCOPIC COLON RESECTION (N/A)  Patient Location: PACU  Anesthesia Type: General  Level of Consciousness: awake  Airway & Oxygen Therapy: Patient Spontanous Breathing and non-rebreather face mask  Post-op Assessment: Report given to PACU RN, Post -op Vital signs reviewed and stable and Patient moving all extremities  Post vital signs: Reviewed and stable  Complications: No apparent anesthesia complications

## 2012-08-18 NOTE — Anesthesia Postprocedure Evaluation (Signed)
Anesthesia Post Note  Patient: [name]  Procedure(s) Performed: Procedure(s) (LRB): HAND ASSISTED LAPAROSCOPIC COLON RESECTION (N/A)  Anesthesia type: General  Patient location: PACU  Post pain: Pain level controlled  Post assessment: Post-op Vital signs reviewed, Patient's Cardiovascular Status Stable, Respiratory Function Stable, Patent Airway, No signs of Nausea or vomiting and Pain level controlled  Last Vitals:  Filed Vitals:   08/18/12 1116  BP: 146/73  Pulse: 84  Temp: 36.6 C  Resp: 13    Post vital signs: Reviewed and stable  Level of consciousness: awake and alert   Complications: No apparent anesthesia complications

## 2012-08-18 NOTE — Anesthesia Preprocedure Evaluation (Addendum)
Anesthesia Evaluation  Patient identified by MRN, date of birth, ID band Patient awake    Reviewed: Allergy & Precautions, H&P , NPO status , Patient's Chart, lab work & pertinent test results  History of Anesthesia Complications Negative for: history of anesthetic complications  Airway Mallampati: II TM Distance: >3 FB     Dental  (+) Edentulous Upper and Edentulous Lower   Pulmonary shortness of breath, asthma , sleep apnea , COPD breath sounds clear to auscultation        Cardiovascular hypertension, Pt. on medications Rhythm:Regular Rate:Normal     Neuro/Psych PSYCHIATRIC DISORDERS Anxiety Depression    GI/Hepatic GERD-  Medicated and Controlled,  Endo/Other    Renal/GU      Musculoskeletal  (+) Arthritis - (chronic LBP, hx epidural steroid injections),   Abdominal   Peds  Hematology   Anesthesia Other Findings   Reproductive/Obstetrics                          Anesthesia Physical Anesthesia Plan  ASA: III  Anesthesia Plan: General   Post-op Pain Management:    Induction: Intravenous, Rapid sequence and Cricoid pressure planned  Airway Management Planned: Oral ETT  Additional Equipment:   Intra-op Plan:   Post-operative Plan: Extubation in OR  Informed Consent: I have reviewed the patients History and Physical, chart, labs and discussed the procedure including the risks, benefits and alternatives for the proposed anesthesia with the patient or authorized representative who has indicated his/her understanding and acceptance.     Plan Discussed with:   Anesthesia Plan Comments:         Anesthesia Quick Evaluation

## 2012-08-18 NOTE — Interval H&P Note (Signed)
History and Physical Interval Note:  08/18/2012 8:54 AM  [name]  has presented today for surgery, with the diagnosis of Colon Cancer  The various methods of treatment have been discussed with the patient and family. After consideration of risks, benefits and other options for treatment, the patient has consented to  Procedure(s) (LRB) with comments: HAND ASSISTED LAPAROSCOPIC COLON RESECTION (N/A) as a surgical intervention .  The patient's history has been reviewed, patient examined, no change in status, stable for surgery.  I have reviewed the patient's chart and labs.  Questions were answered to the patient's satisfaction.     Franky Macho A

## 2012-08-18 NOTE — Op Note (Signed)
Patient:  [name]  DOB:  1940/12/05  MRN:  [id number]   Preop Diagnosis:  Right colon neoplasm  Postop Diagnosis:  Same  Procedure:  Laparoscopic hand-assisted right hemicolectomy  Surgeon:  Franky Macho, M.D.  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 The patient comes the operating room for laparoscopic hand-assisted right colectomy. The risks and benefits of the procedure including bleeding, infection, cardiopulmonary difficulties, the possibly of an open procedure the possibility of a blood transfusion, and the possibility of requiring postoperative intubation were fully explained to the patient, gave informed consent.  Procedure note:  Patient was placed in the low lithotomy position after general anesthesia was administered. The abdomen was prepped and draped using usual sterile technique with DuraPrep. Surgical site confirmation was performed.  A midline incision was made in the periumbilical region. The peritoneal cavity was entered without difficulty. A GelPort was then inserted. An additional 11 mm trocar was placed the left upper quadrant and another one placed in the left lower quadrant under direct palpation. The abdomen was then insufflated to 16 mm mercury pressure. The liver was inspected and noted within normal limits. The descending colon was mobilized along its peritoneal reflection. A pack flexure was taken down using the LigaSure. The right colon was then mobilized and exteriorized through the GelPort. A GIA stapler was placed across the terminal ileum as well as the proximal transverse colon and fired. The mesentery was divided using the LigaSure. The specimen sent to pathology further examination. A side to side ileocolic anastomosis was then performed using a GIA 70  stapler. The enterotomy was closed using a TA 60 stapler. The stapler was posterior using 3-0 silk sutures. The greater omentum was then placed over the anastomosis and secured in place using 3-0 silk sutures. The mesenteric defect was closed using a 2-0 chromic gut running suture. The bowel was then returned to the abdominal cavity an orderly fashion. All surgical personnel then changed their gloves. The abdomen was copious irrigated normal saline. The abdomen was reinsufflated and the hepatic flexure and right paracolic gutter within normal limits. No abnormal bleeding was noted. Surgicel and Gelfoam were then placed at the hepatic flexure. All fluid and air were then evacuated from the abdominal cavity prior to removal of the trochars in the GelPort.  The midline incision was closed using an 0 PDS running suture. All skin incisions were closed using staples. 0.5% Sensorcaine was instilled the surrounding wound. Betadine ointment dorsal dressings were applied.  All tape and needle counts were correct at the end of the procedure. Patient was extubated in the operating room and transferred to PACU in stable condition.    Complications:  None  EBL:  100 cc  Specimen:  Right colon

## 2012-08-18 NOTE — Preoperative (Signed)
Beta Blockers   Reason not to administer Beta Blockers:Not Applicable 

## 2012-08-18 NOTE — Anesthesia Procedure Notes (Signed)
Procedure Name: Intubation Date/Time: 08/18/2012 9:29 AM Performed by: Carolyne Littles, Ames Hoban L Pre-anesthesia Checklist: Patient identified, Patient being monitored, Timeout performed, Emergency Drugs available and Suction available Patient Re-evaluated:Patient Re-evaluated prior to inductionOxygen Delivery Method: Circle System Utilized Preoxygenation: Pre-oxygenation with 100% oxygen Intubation Type: IV induction, Rapid sequence and Cricoid Pressure applied Laryngoscope Size: 3 and Miller Grade View: Grade I Tube type: Oral Tube size: 7.0 mm Number of attempts: 1 Airway Equipment and Method: stylet Placement Confirmation: ETT inserted through vocal cords under direct vision,  positive ETCO2 and breath sounds checked- equal and bilateral Secured at: 21 cm Tube secured with: Tape Dental Injury: Teeth and Oropharynx as per pre-operative assessment

## 2012-08-19 LAB — BASIC METABOLIC PANEL
CO2: 30 mEq/L (ref 19–32)
Calcium: 9.4 mg/dL (ref 8.4–10.5)
Creatinine, Ser: 0.81 mg/dL (ref 0.50–1.10)
GFR calc Af Amer: 83 mL/min — ABNORMAL LOW (ref >90)

## 2012-08-19 LAB — CBC
Hemoglobin: 10.4 g/dL — ABNORMAL LOW (ref 12.0–15.0)
MCH: 25.9 pg — ABNORMAL LOW (ref 26.0–34.0)
MCHC: 30.9 g/dL (ref 30.0–36.0)

## 2012-08-19 LAB — MAGNESIUM: Magnesium: 1.7 mg/dL (ref 1.5–2.5)

## 2012-08-19 MED ORDER — HYDROCODONE-ACETAMINOPHEN 5-325 MG PO TABS
1.0000 | ORAL_TABLET | ORAL | Status: DC | PRN
  Administered 2012-08-19 – 2012-08-20 (×4): 2 via ORAL
  Administered 2012-08-20: 1 via ORAL
  Administered 2012-08-21: 2 via ORAL
  Administered 2012-08-22 (×2): 1 via ORAL
  Filled 2012-08-19: qty 2
  Filled 2012-08-19 (×3): qty 1
  Filled 2012-08-19 (×4): qty 2

## 2012-08-19 NOTE — Progress Notes (Signed)
1 Day Post-Op  Subjective: Feels good. No shortness of breath. Mild incisional pain.  Objective: Vital signs in last 24 hours: Temp:  [97.9 F (36.6 C)-98.7 F (37.1 C)] 98.3 F (36.8 C) (10/05 0400) Pulse Rate:  [76-99] 88  (10/05 0900) Resp:  [10-21] 20  (10/05 0900) BP: (124-176)/(54-97) 124/75 mmHg (10/05 0900) SpO2:  [91 %-100 %] 91 % (10/05 0900) Weight:  [115.4 kg (254 lb 6.6 oz)] 115.4 kg (254 lb 6.6 oz) (10/04 1300) Last BM Date: 08/17/12  Intake/Output from previous day: 10/04 0701 - 10/05 0700 In: 5930 [P.O.:780; I.V.:4750; IV Piggyback:400] Out: 1180 [Urine:1080; Blood:100] Intake/Output this shift: Total I/O In: 850 [P.O.:600; I.V.:250] Out: 1000 [Urine:1000]  General appearance: alert, cooperative and no distress Resp: clear to auscultation bilaterally Cardio: regular rate and rhythm, S1, S2 normal, no murmur, click, rub or gallop GI: Soft. Dressings dry and intact. No bowel sounds appreciated.  Lab Results:   [facility] 08/19/12 0513  WBC 12.4*  HGB 10.4*  HCT 33.7*  PLT 251   BMET  Basename 08/19/12 0513  NA 140  K 4.0  CL 101  CO2 30  GLUCOSE 104*  BUN 12  CREATININE 0.81  CALCIUM 9.4   PT/INR No results found for this basename: LABPROT:2,INR:2 in the last 72 hours  Studies/Results: No results found.  Anti-infectives: Anti-infectives     Start     Dose/Rate Route Frequency Ordered Stop   08/18/12 0817   ertapenem (INVANZ) 1 g in sodium chloride 0.9 % 50 mL IVPB  Status:  Discontinued        1 g 100 mL/hr over 30 Minutes Intravenous 60 min pre-op 08/18/12 0817 08/18/12 1210          Assessment/Plan: s/p Procedure(s): HAND ASSISTED LAPAROSCOPIC COLON RESECTION Impression: Stable, postoperative day one. Respiratory status stable. Awaiting return of bowel function. Will transfer to step down unit. Will remove Foley.  LOS: 1 day    [name] 08/19/2012

## 2012-08-20 LAB — BASIC METABOLIC PANEL
Chloride: 101 mEq/L (ref 96–112)
GFR calc Af Amer: 81 mL/min — ABNORMAL LOW (ref >90)
Potassium: 3.6 mEq/L (ref 3.5–5.1)

## 2012-08-20 LAB — MAGNESIUM: Magnesium: 1.7 mg/dL (ref 1.5–2.5)

## 2012-08-20 LAB — CBC
HCT: 30.9 % — ABNORMAL LOW (ref 36.0–46.0)
Platelets: 228 10*3/uL (ref 150–400)
RBC: 3.59 MIL/uL — ABNORMAL LOW (ref 3.87–5.11)
RDW: 15.1 % (ref 11.5–15.5)
WBC: 9.3 10*3/uL (ref 4.0–10.5)

## 2012-08-20 LAB — PHOSPHORUS: Phosphorus: 2.9 mg/dL (ref 2.3–4.6)

## 2012-08-20 MED ORDER — KCL IN DEXTROSE-NACL 20-5-0.45 MEQ/L-%-% IV SOLN
INTRAVENOUS | Status: DC
  Administered 2012-08-20 – 2012-08-22 (×3): via INTRAVENOUS

## 2012-08-20 MED ORDER — ACETAMINOPHEN 500 MG PO TABS
500.0000 mg | ORAL_TABLET | ORAL | Status: DC | PRN
  Administered 2012-08-20: 500 mg via ORAL
  Filled 2012-08-20: qty 1

## 2012-08-20 NOTE — Anesthesia Postprocedure Evaluation (Signed)
Anesthesia Post Note  Patient: [name]  Procedure(s) Performed: Procedure(s) (LRB): HAND ASSISTED LAPAROSCOPIC COLON RESECTION (N/A)  Anesthesia type: Genera  Post pain: Pain level controlled  Post assessment: Post-op Vital signs reviewed, Patient's Cardiovascular Status Stable, Respiratory Function Stable, Patent Airway, No signs of Nausea or vomiting and Pain level controlled  Last Vitals:  Filed Vitals:   08/20/12 0800  BP: 123/83  Pulse: 89  Temp: 36.9 C  Resp: 19    Post vital signs: Reviewed and stable  Level of consciousness: awake and alert   Complications: No apparent anesthesia complications

## 2012-08-20 NOTE — Progress Notes (Signed)
Pt was using her walker and hospital portable oxygen tank during ambulation.  Patient was hooked up to portable monitoring system.  VSS.

## 2012-08-20 NOTE — Progress Notes (Signed)
2 Days Post-Op  Subjective: Minimal incisional pain. States she gets short of breath to resolve often, though this is her normal state.  Objective: Vital signs in last 24 hours: Temp:  [98.3 F (36.8 C)-98.8 F (37.1 C)] 98.5 F (36.9 C) (10/06 0800) Pulse Rate:  [77-97] 89  (10/06 0800) Resp:  [9-24] 19  (10/06 0800) BP: (96-158)/(44-95) 123/83 mmHg (10/06 0800) SpO2:  [86 %-98 %] 90 % (10/06 0858) Weight:  [113.2 kg (249 lb 9 oz)] 113.2 kg (249 lb 9 oz) (10/06 0342) Last BM Date: 08/19/12  Intake/Output from previous day: 10/05 0701 - 10/06 0700 In: 4360 [P.O.:2460; I.V.:1900] Out: 1802 [Urine:1800; Stool:2] Intake/Output this shift: Total I/O In: 315 [P.O.:240; I.V.:75] Out: -   General appearance: alert, cooperative and no distress Resp: clear to auscultation bilaterally Cardio: regular rate and rhythm, S1, S2 normal, no murmur, click, rub or gallop GI: Soft. Occasional bowel sounds appreciated. Incision healing well.  Lab Results:   Basename 08/20/12 0524 08/19/12 0513  WBC 9.3 12.4*  HGB 9.3* 10.4*  HCT 30.9* 33.7*  PLT 228 251   BMET  Basename 08/20/12 0524 08/19/12 0513  NA 137 140  K 3.6 4.0  CL 101 101  CO2 32 30  GLUCOSE 87 104*  BUN 12 12  CREATININE 0.83 0.81  CALCIUM 9.2 9.4   PT/INR No results found for this basename: LABPROT:2,INR:2 in the last 72 hours  Studies/Results: No results found.  Anti-infectives: Anti-infectives     Start     Dose/Rate Route Frequency Ordered Stop   08/18/12 0817   ertapenem (INVANZ) 1 g in sodium chloride 0.9 % 50 mL IVPB  Status:  Discontinued        1 g 100 mL/hr over 30 Minutes Intravenous 60 min pre-op 08/18/12 0817 08/18/12 1210          Assessment/Plan: s/p Procedure(s): HAND ASSISTED LAPAROSCOPIC COLON RESECTION Impression: Stable postoperative day 2. Blood tests look good. Bowel function is returning. Will stop the Coreg and advanced diet. We'll also start to ambulate patient, though she does  use a walker and is on home O2. We'll continue step down care for now.  LOS: 2 days    Halston Kintz A 08/20/2012

## 2012-08-20 NOTE — Progress Notes (Signed)
Nurse tech with patient to walk per MD request.  Pt a little unsteady with writer this am during bath and getting up to [facility].  Instructed nurse tech, Cassie French to go slow and not push patient to walk far. Pt with weakness to bilateral legs.  Nurse tech frequently ask patient to wait for her.  Patient quick to move without assistance.  Writer retrieving medications from pxysis and observed patient and tech walking from bed to the door.  Family member beside patient. Pt verbalized feeling weak and her legs gave way. Pt eased to the floor by nurse tech and family member. Writer assessed patient for injuries and no apparent problems discovered.  Patient stated, "I fall frequently at home". Family member confirmed patient has issues with falling due to a pinched nerve in her back.  Writer and nursing assistant Cassie French assisted patient back to bed using the gait belt. Patient verbalized not wanting to walk again today. Dr. Lovell Sheehan notified of patient's weakness and incident, writer requested PT consult for strengthening and ambulation.  Patient verbalized she was hard-headed and just wanted to hurry up and get home.  No acute distress noted. Patient resting comfortably in bed with family at the bedside.

## 2012-08-20 NOTE — Addendum Note (Signed)
Addendum  created 08/20/12 1041 by Franco Nones, CRNA   Modules edited:Notes Section

## 2012-08-21 LAB — PHOSPHORUS: Phosphorus: 3.2 mg/dL (ref 2.3–4.6)

## 2012-08-21 LAB — MAGNESIUM: Magnesium: 1.6 mg/dL (ref 1.5–2.5)

## 2012-08-21 LAB — BASIC METABOLIC PANEL
CO2: 31 mEq/L (ref 19–32)
Calcium: 9.2 mg/dL (ref 8.4–10.5)
Chloride: 102 mEq/L (ref 96–112)
Potassium: 3.5 mEq/L (ref 3.5–5.1)
Sodium: 141 mEq/L (ref 135–145)

## 2012-08-21 LAB — CBC
Platelets: 239 10*3/uL (ref 150–400)
RBC: 3.72 MIL/uL — ABNORMAL LOW (ref 3.87–5.11)
WBC: 9.1 10*3/uL (ref 4.0–10.5)

## 2012-08-21 NOTE — Care Management Note (Signed)
    Page 1 of 2   08/23/2012     2:58:13 PM   CARE MANAGEMENT NOTE 08/23/2012  Patient:  [name], [name]   Account Number:  [id number]  Date Initiated:  08/21/2012  Documentation initiated by:  Sharrie Rothman  Subjective/Objective Assessment:   Pt admitted from home s/p right hemicolectomy. Pt lives alone and has a daughter and grandchild that checks on her frequently. Pt is fairly independent and will return home at discharge. Pt hoes have a rollator and O2 at home.     Action/Plan:   No CM or HH needs noted. Will continue to follow.   Anticipated DC Date:  08/23/2012   Anticipated DC Plan:  HOME/SELF CARE      DC Planning Services  CM consult      [facility] Choice  HOME HEALTH   Choice offered to / List presented to:  C-4 Adult Children        HH arranged  HH-1 RN  HH-2 PT      HH agency  [facility]   Status of service:  Completed, signed off Medicare Important Message given?   (If response is "NO", the following Medicare IM given date fields will be blank) Date Medicare IM given:   Date Additional Medicare IM given:    Discharge Disposition:  HOME W HOME HEALTH SERVICES  Per UR Regulation:    If discussed at Long Length of Stay Meetings, dates discussed:    Comments:  08/23/12 1500 Arlyss Queen, RN BSN CM Pt discharged home today with [facility]. Tamala Bari of [name] is aware of referral and orders sent to [facility]. HH services will start within 48 hours. Pt and pts nurse is aware of discharge arrangements. No DME needs noted.  08/22/12 1533 Arlyss Queen, RN BSN Cm Pt is potential for discharge home on 08/23/12. Pt is refusing SNF. Per pts daughter, [name], the family is going to provide around the clock care for the pt. Pts daughter has chosen [facility]. Pt will need RN and PT. Tamala Bari of [facility] is aware and will collect the pts orders and information from the chart on 08/23/12.  08/21/12 1320 Arlyss Queen, RN BSN CM

## 2012-08-21 NOTE — Progress Notes (Signed)
3 Days Post-Op  Subjective: Feels okay. Was weak yesterday during ambulation and had to be assisted significantly. Tolerating soft diet well. To have a bowel movement yesterday again.  Objective: Vital signs in last 24 hours: Temp:  [97.6 F (36.4 C)-102.5 F (39.2 C)] 98.1 F (36.7 C) (10/07 0815) Pulse Rate:  [78-117] 82  (10/07 1000) Resp:  [10-25] 19  (10/07 1100) BP: (73-154)/(31-118) 73/31 mmHg (10/07 1100) SpO2:  [90 %-97 %] 96 % (10/07 1000) Weight:  [112.9 kg (248 lb 14.4 oz)] 112.9 kg (248 lb 14.4 oz) (10/07 0500) Last BM Date: 08/20/12  Intake/Output from previous day: 10/06 0701 - 10/07 0700 In: 2731.7 [P.O.:1500; I.V.:1231.7] Out: 1750 [Urine:1750] Intake/Output this shift: Total I/O In: 440 [P.O.:240; I.V.:200] Out: 500 [Urine:500]  General appearance: alert, cooperative and no distress Resp: clear to auscultation bilaterally Cardio: regular rate and rhythm, S1, S2 normal, no murmur, click, rub or gallop GI: Soft. No tenderness noted. Incisions healing well.  Lab Results:   Basename 08/21/12 0447 08/20/12 0524  WBC 9.1 9.3  HGB 9.6* 9.3*  HCT 32.0* 30.9*  PLT 239 228   BMET  Basename 08/21/12 0447 08/20/12 0524  NA 141 137  K 3.5 3.6  CL 102 101  CO2 31 32  GLUCOSE 95 87  BUN 11 12  CREATININE 0.74 0.83  CALCIUM 9.2 9.2   PT/INR No results found for this basename: LABPROT:2,INR:2 in the last 72 hours  Studies/Results: No results found.  Anti-infectives: Anti-infectives     Start     Dose/Rate Route Frequency Ordered Stop   08/18/12 0817   ertapenem (INVANZ) 1 g in sodium chloride 0.9 % 50 mL IVPB  Status:  Discontinued        1 g 100 mL/hr over 30 Minutes Intravenous 60 min pre-op 08/18/12 0817 08/18/12 1210          Assessment/Plan: s/p Procedure(s): HAND ASSISTED LAPAROSCOPIC COLON RESECTION Impression: Doing well on postoperative day 3. Bowel function has returned. She is still a little and requires assistance with fibrillation.  Physical therapy and consult. Pathology pending.  LOS: 3 days    [name] A 08/21/2012

## 2012-08-21 NOTE — Evaluation (Signed)
Physical Therapy Evaluation Patient Details Name: [name] MRN: [id number] DOB: 10-10-1941 Today's Date: 08/21/2012 Time: 4098-1191 PT Time Calculation (min): 50 min  PT Assessment / Plan / Recommendation Clinical Impression  Pt was seen for initial tx.  She is deconditioned with difficulty walking.  She did tolerate ther ex well, able to do 20 reps of all ex.  She walked 84' with 2 wheeled walker, min guard.  She states that she has a good friend who can stay with her at discharge (pt lives alone).  In my opinion, she must have 24 hour assist initially.  HHPT will be needed.  If she does not end up having full time help at d/c, she will need to go to SNF.    PT Assessment  Patient needs continued PT services    Follow Up Recommendations  Home health PT    Does the patient have the potential to tolerate intense rehabilitation      Barriers to Discharge Decreased caregiver support      Equipment Recommendations  None recommended by PT    Recommendations for Other Services OT consult   Frequency Min 3X/week    Precautions / Restrictions Precautions Precautions: Fall Restrictions Weight Bearing Restrictions: No   Pertinent Vitals/Pain       Mobility  Bed Mobility Bed Mobility: Supine to Sit;Sit to Supine Supine to Sit: 6: Modified independent (Device/Increase time);HOB elevated Sit to Supine: 5: Supervision;HOB flat Details for Bed Mobility Assistance: pt does have to lift LLE into the bed with her hand, even though she was able to do SLR x 20 reps Transfers Transfers: Sit to Stand;Stand to Sit Sit to Stand: From bed;With upper extremity assist;5: Supervision Stand to Sit: 5: Supervision;With upper extremity assist;To bed Details for Transfer Assistance: pt declines sitting in a chair...too tired Ambulation/Gait Ambulation/Gait Assistance: 4: Min guard Ambulation Distance (Feet): 80 Feet Assistive device: Rolling walker Ambulation/Gait Assistance Details:  pt has a 4 wheeled walker here with her, but we elected to use a 2 wheeled walker instead for increased stability Gait Pattern: Within Functional Limits Gait velocity: slow and labored General Gait Details: pt leans heavily on walker, O2 on 4L/min...she has had an incident at bedside where her knees bucked under her and she slid to the floor...she needs to be watched very carefully for safety Stairs: No Wheelchair Mobility Wheelchair Mobility: No    Shoulder Instructions     Exercises General Exercises - Lower Extremity Ankle Circles/Pumps: AROM;10 reps;Supine Short Arc Quad: AROM;Both;20 reps;Supine;Strengthening Heel Slides: AROM;Strengthening;Both;20 reps;Supine Hip ABduction/ADduction: AROM;Strengthening;Both;20 reps;Supine Straight Leg Raises: AROM;Both;20 reps;Supine   PT Diagnosis: Difficulty walking;Generalized weakness  PT Problem List: Decreased strength;Decreased activity tolerance;Decreased mobility;Decreased safety awareness PT Treatment Interventions: Gait training;Functional mobility training;Therapeutic activities;Therapeutic exercise;Patient/family education   PT Goals Acute Rehab PT Goals PT Goal Formulation: With patient Time For Goal Achievement: 09/04/12 Potential to Achieve Goals: Good Pt will Ambulate: 51 - 150 feet;with supervision;with rolling walker PT Goal: Ambulate - Progress: Goal set today  Visit Information  Last PT Received On: 08/21/12    Subjective Data  Subjective: I sometimes do things too fast and then I get into trouble Patient Stated Goal: retrun home   Prior Functioning  Home Living Lives With: Alone Available Help at Discharge: Friend(s);Available 24 hours/day Type of Home: House Home Access: Ramped entrance Home Layout: One level Firefighter: Standard Home Adaptive Equipment: Wheelchair - [employer] - four wheeled;Walker - rolling;Straight cane;Bedside commode/3-in-1 Prior Function Level of Independence: Independent with  assistive  device(s) Able to Take Stairs?: No Vocation: Retired Musician: No difficulties    Cognition  Overall Cognitive Status: Appears within functional limits for tasks assessed/performed Arousal/Alertness: Awake/alert Orientation Level: Appears intact for tasks assessed Behavior During Session: [facility] for tasks performed    Extremity/Trunk Assessment Right Lower Extremity Assessment RLE ROM/Strength/Tone: [facility] for tasks assessed (genu valgus at knee) RLE Sensation: WFL - Light Touch Left Lower Extremity Assessment LLE ROM/Strength/Tone: WFL for tasks assessed (genu valgus at knee) LLE Sensation: WFL - Light Touch Trunk Assessment Trunk Assessment: Normal   Balance Balance Balance Assessed: No  End of Session PT - End of Session Equipment Utilized During Treatment: Gait belt Activity Tolerance: Patient tolerated treatment well Patient left: in bed;with call bell/phone within reach Nurse Communication: Mobility status  GP     Myrlene Broker L 08/21/2012, 1:51 PM

## 2012-08-22 ENCOUNTER — Encounter (HOSPITAL_COMMUNITY): Payer: Self-pay | Admitting: General Surgery

## 2012-08-22 MED ORDER — SODIUM CHLORIDE 0.9 % IJ SOLN
INTRAMUSCULAR | Status: AC
  Filled 2012-08-22: qty 3

## 2012-08-22 NOTE — Progress Notes (Signed)
Patient declined flu vaccine at this time. Will get at PCP after discharge.

## 2012-08-22 NOTE — Clinical Social Work Note (Signed)
CSW met w patient at bedside.  Patient wants to return home, says she has friend coming to stay w her 24/7.  Friend previously assisted patient when she had broken leg.  Daughter lives across street, works during day, but available at nights and weekends.  Patient feels she has adequate support at home and declines SNF placement.  CSW signing off unless further needs arise.  Santa Genera, LCSW Clinical Social Worker [phone])

## 2012-08-22 NOTE — Progress Notes (Signed)
4 Days Post-Op  Subjective: Still feels weak when ambulating with assistance. He is tolerating by mouth well.  Objective: Vital signs in last 24 hours: Temp:  [98 F (36.7 C)-100.1 F (37.8 C)] 98.5 F (36.9 C) (10/08 0815) Pulse Rate:  [82-83] 83  (10/08 1000) Resp:  [11-22] 14  (10/08 1000) BP: (73-179)/(31-90) 133/69 mmHg (10/08 1000) SpO2:  [92 %-96 %] 96 % (10/08 1000) Weight:  [114.1 kg (251 lb 8.7 oz)] 114.1 kg (251 lb 8.7 oz) (10/08 0500) Last BM Date: 08/21/12  Intake/Output from previous day: 10/07 0701 - 10/08 0700 In: 1620 [P.O.:1080; I.V.:540] Out: 2100 [Urine:2100] Intake/Output this shift: Total I/O In: 240 [P.O.:240] Out: -   General appearance: alert and cooperative Resp: clear to auscultation bilaterally Cardio: regular rate and rhythm, S1, S2 normal, no murmur, click, rub or gallop GI: Soft, nontender, nondistended. Incision healing well.  Lab Results:   Basename 08/21/12 0447 08/20/12 0524  WBC 9.1 9.3  HGB 9.6* 9.3*  HCT 32.0* 30.9*  PLT 239 228   BMET  Basename 08/21/12 0447 08/20/12 0524  NA 141 137  K 3.5 3.6  CL 102 101  CO2 31 32  GLUCOSE 95 87  BUN 11 12  CREATININE 0.74 0.83  CALCIUM 9.2 9.2   PT/INR No results found for this basename: LABPROT:2,INR:2 in the last 72 hours  Studies/Results: No results found.  Anti-infectives: Anti-infectives     Start     Dose/Rate Route Frequency Ordered Stop   08/18/12 0817   ertapenem (INVANZ) 1 g in sodium chloride 0.9 % 50 mL IVPB  Status:  Discontinued        1 g 100 mL/hr over 30 Minutes Intravenous 60 min pre-op 08/18/12 0817 08/18/12 1210          Assessment/Plan: s/p Procedure(s): HAND ASSISTED LAPAROSCOPIC COLON RESECTION Impression: Having difficulties with ambulation. She does have a history of needing a walker to ambulate. She also is on home oxygen. She has agreed to temporary rehabilitation placement until her strength is improved. Final pathology is still pending.  We'll consult social work for assistance.  LOS: 4 days    Karyna Bessler A 08/22/2012

## 2012-08-22 NOTE — Progress Notes (Signed)
Physical Therapy Treatment Patient Details Name: [name] MRN: [id number] DOB: May 06, 1941 Today's Date: 08/22/2012 Time: 4098-1191 PT Time Calculation (min): 23 min 19' gait   PT Assessment / Plan / Recommendation Comments on Treatment Session  Patient motivated to return home. Daughter present and assured they have "things in place" to take care of her mother at home;daughter lives accross the street as well. Patient completed a total of 200' of gait training with RW;min guard;knees did buckle somewhat x2 but pt able to maintain balance. Several standing rest breaks required to bring O2 sats from upper 80's to mid 90's. With patient desire to return home 24hr assistance and HHPT reccommended.    Follow Up Recommendations        Does the patient have the potential to tolerate intense rehabilitation     Barriers to Discharge        Equipment Recommendations       Recommendations for Other Services    Frequency     Plan      Precautions / Restrictions     Pertinent Vitals/Pain     Mobility  Bed Mobility Bed Mobility: Not assessed Transfers Sit to Stand: 5: Supervision;From chair/3-in-1;From toilet Stand to Sit: 5: Supervision;To chair/3-in-1;To toilet Details for Transfer Assistance: verbal cues to back completely to surface before sitting Ambulation/Gait Ambulation/Gait Assistance: 4: Min guard Ambulation Distance (Feet): 200 Feet Assistive device: Rolling walker Ambulation/Gait Assistance Details: 2 wheeled walker used. 3-4 standing rest breaks needed to bring O2 sats from upper 80/s to mid 90's. Left knee buckled x2 patient able to recover Gait Pattern: Trunk flexed;Shuffle Gait velocity: very slow General Gait Details: patient not "leaning" on RW as previous observed yesterday Stairs: No Wheelchair Mobility Wheelchair Mobility: No    Exercises     PT Diagnosis:    PT Problem List:   PT Treatment Interventions:     PT Goals    Visit  Information  Last PT Received On: 08/22/12    Subjective Data  Subjective: I am ready to get out of here. Ready to go home.   Cognition       Balance     End of Session PT - End of Session Equipment Utilized During Treatment: Gait belt Activity Tolerance: Patient tolerated treatment well Patient left: in chair;with call bell/phone within reach;with nursing in room;with family/visitor present   GP     Demarius Archila ATKINSO 08/22/2012, 12:11 PM

## 2012-08-23 MED ORDER — HYDROCODONE-ACETAMINOPHEN 5-325 MG PO TABS: 1.0000 | ORAL_TABLET | Freq: Four times a day (QID) | ORAL | Status: DC | PRN

## 2012-08-23 NOTE — Clinical Social Work Psychosocial (Signed)
    Clinical Social Work Department BRIEF PSYCHOSOCIAL ASSESSMENT 08/23/2012  Patient:  [name], [name]     Account Number:  [id number]     Admit date:  08/18/2012  Clinical Social Worker:  Santa Genera, CLINICAL SOCIAL WORKER  Date/Time:  08/22/2012 12:00 N  Referred by:  Physician  Date Referred:  08/22/2012 Referred for  SNF Placement   Other Referral:   Interview type:  Patient Other interview type:    PSYCHOSOCIAL DATA Living Status:  ALONE Admitted from facility:   Level of care:   Primary support name:  Marliss Czar Primary support relationship to patient:  CHILD, ADULT Degree of support available:   Significant    CURRENT CONCERNS  Other Concerns:    SOCIAL WORK ASSESSMENT / PLAN CSW met w patient at bedside to discuss discharge planning. Per MD, SNF is recommended as patient lives alone.  PT has evaluated patient and recommended [facility] PT as patient told PT they wanted to return home.  MD concerned that patient cannot manage self care w O2 and needs rehab at this point. Concerned that daughter lives across street and works during day so is unavailable.  Discussed concerns w patient, who stated that she has a friend from [place] coming to stay w her until she "regains her strength." Friend previously stayed w her when she broke her leg and was able to assist patient effectively.  Patient says her daughter will be available nights and weekends as well. Patient confident that she wants to return home at discharge and that she has sufficient help to manage.  Does not want SNF placement at this time.  CSW signing off unless further needs arise.   Assessment/plan status:  Psychosocial Support/Ongoing Assessment of Needs Other assessment/ plan:   Information/referral to community resources:   None needed    PATIENT'S/FAMILY'S RESPONSE TO PLAN OF CARE: Appreciative.    Santa Genera, LCSW Clinical Social Worker [phone])

## 2012-08-23 NOTE — Progress Notes (Signed)
Patient is ready for discharge.  IV removed and instructions given to patient and patients daughter.  Script for pain medications also given to patient.  Patient and daughter verbalized understanding of discharge instructions.  Out via wheelchair to vehicle for transport home.  No acute distress noted.

## 2012-08-23 NOTE — Discharge Summary (Signed)
Physician Discharge Summary  Patient ID: [name] MRN: [id number] DOB/AGE: 71-11-1940 71 y.o.  Admit date: 08/18/2012 Discharge date: 08/23/2012  Admission Diagnoses: Dysplastic adenoma, right colon  Discharge Diagnoses: Same Active Problems:  * No active hospital problems. *    Discharged Condition: good  Hospital Course: Patient is a 71 year old black female who underwent colonoscopy by Dr. workup gastroenterology was found to have a dysplastic adenomatous polyp in the ascending colon which could not be removed endoscopically. The patient thus was referred to surgery for a laparoscopic hand-assisted right hemicolectomy. This was performed on 08/18/2012. Her postoperative course was remarkable for some mild weakness. She is on home oxygen and also requires a walker for assistance with ambulation. Despite being offered rehabilitation in a nursing home, the patient elected to proceed home. Final pathology did reveal no evidence of malignant change of the adenoma. Several other polyps were noted to be within the specimen removed.  Treatments: surgery: Laparoscopic hand-assisted right hemicolectomy on 08/18/2012  Discharge Exam: Blood pressure 143/71, pulse 25, temperature 98.5 F (36.9 C), temperature source Oral, resp. rate 13, height 6\' 1"  (1.854 m), weight 110.9 kg (244 lb 7.8 oz), SpO2 85.00%. General appearance: alert, cooperative and no distress Resp: clear to auscultation bilaterally Cardio: regular rate and rhythm, S1, S2 normal, no murmur, click, rub or gallop GI: Soft, nontender, nondistended. Good bowel sounds appreciated. Incisions healing well.  Disposition: 01-Home or Self Care  Discharge Orders    Future Appointments: Provider: Department: Dept Phone: Center:   11/30/2012 2:30 PM Kerri Perches, MD [facility] [phone] [facility]       Medication List     As of 08/23/2012 12:16 PM    TAKE these medications         acetaminophen 500 MG tablet    Commonly known as: TYLENOL   Take 1,000 mg by mouth 2 (two) times daily.      arformoterol 15 MCG/2ML Nebu   Commonly known as: BROVANA   Take 2 mLs (15 mcg total) by nebulization 2 (two) times daily.      calcium-vitamin D 500-200 MG-UNIT per tablet   Commonly known as: OSCAL WITH D   Take 1 tablet by mouth daily.      CEROVITE SENIOR Tabs   Take 1 tablet by mouth daily.      FLUoxetine 40 MG capsule   Commonly known as: PROZAC   Take 1 capsule (40 mg total) by mouth daily.      GAVILYTE-N WITH FLAVOR PACK 420 G solution   Generic drug: polyethylene glycol-electrolytes   Take 1 Bottle by mouth once. For procedure.      HYDROcodone-acetaminophen 5-325 MG per tablet   Commonly known as: NORCO/VICODIN   Take 1 tablet by mouth every 6 (six) hours as needed for pain.      omeprazole 40 MG capsule   Commonly known as: PRILOSEC   Take 1 capsule (40 mg total) by mouth daily.      verapamil 240 MG CR tablet   Commonly known as: CALAN-SR   Take 1 tablet (240 mg total) by mouth at bedtime.           Follow-up Information    Follow up with Dalia Heading, MD. Schedule an appointment as soon as possible for a visit on 08/31/2012.   Contact information:   [address] [place] [address] [phone]          Signed: Franky Macho A 08/23/2012, 12:16 PM

## 2012-08-23 NOTE — Progress Notes (Signed)
UR Chart Review Completed  

## 2012-08-24 LAB — URINE CULTURE

## 2012-08-25 LAB — TYPE AND SCREEN
ABO/RH(D): A POS
Antibody Screen: NEGATIVE
Unit division: 0

## 2012-09-08 MED ORDER — SODIUM CHLORIDE 0.9 % IJ SOLN
INTRAMUSCULAR | Status: AC
  Filled 2012-09-08: qty 3

## 2012-09-27 ENCOUNTER — Telehealth: Payer: Self-pay | Admitting: Family Medicine

## 2012-09-27 DIAGNOSIS — K219 Gastro-esophageal reflux disease without esophagitis: Secondary | ICD-10-CM

## 2012-09-27 DIAGNOSIS — F329 Major depressive disorder, single episode, unspecified: Secondary | ICD-10-CM

## 2012-09-27 DIAGNOSIS — I10 Essential (primary) hypertension: Secondary | ICD-10-CM

## 2012-09-27 MED ORDER — VERAPAMIL HCL ER 240 MG PO TBCR: 240.0000 mg | EXTENDED_RELEASE_TABLET | Freq: Every day | ORAL | Status: DC

## 2012-09-27 MED ORDER — OMEPRAZOLE 40 MG PO CPDR: 40.0000 mg | DELAYED_RELEASE_CAPSULE | Freq: Every day | ORAL | Status: DC

## 2012-09-27 MED ORDER — FLUOXETINE HCL 40 MG PO CAPS: 40.0000 mg | ORAL_CAPSULE | Freq: Every day | ORAL | Status: DC

## 2012-09-27 NOTE — Telephone Encounter (Signed)
Faxed in

## 2012-11-16 ENCOUNTER — Telehealth: Payer: Self-pay | Admitting: Family Medicine

## 2012-11-16 NOTE — Telephone Encounter (Signed)
Scheduled for Monday  °

## 2012-11-16 NOTE — Telephone Encounter (Signed)
Make appt for Dr [facility] tomorrow

## 2012-11-20 ENCOUNTER — Encounter: Payer: Self-pay | Admitting: Family Medicine

## 2012-11-20 ENCOUNTER — Ambulatory Visit (INDEPENDENT_AMBULATORY_CARE_PROVIDER_SITE_OTHER): Payer: Medicare PPO | Admitting: Family Medicine

## 2012-11-20 VITALS — BP 140/80 | HR 110 | Resp 18 | Ht 73.0 in | Wt 241.0 lb

## 2012-11-20 DIAGNOSIS — M949 Disorder of cartilage, unspecified: Secondary | ICD-10-CM

## 2012-11-20 DIAGNOSIS — M25559 Pain in unspecified hip: Secondary | ICD-10-CM

## 2012-11-20 DIAGNOSIS — I10 Essential (primary) hypertension: Secondary | ICD-10-CM

## 2012-11-20 DIAGNOSIS — M899 Disorder of bone, unspecified: Secondary | ICD-10-CM

## 2012-11-20 DIAGNOSIS — J449 Chronic obstructive pulmonary disease, unspecified: Secondary | ICD-10-CM

## 2012-11-20 DIAGNOSIS — M25562 Pain in left knee: Secondary | ICD-10-CM

## 2012-11-20 DIAGNOSIS — D649 Anemia, unspecified: Secondary | ICD-10-CM

## 2012-11-20 DIAGNOSIS — R0989 Other specified symptoms and signs involving the circulatory and respiratory systems: Secondary | ICD-10-CM

## 2012-11-20 DIAGNOSIS — R7309 Other abnormal glucose: Secondary | ICD-10-CM

## 2012-11-20 DIAGNOSIS — M25569 Pain in unspecified knee: Secondary | ICD-10-CM

## 2012-11-20 DIAGNOSIS — R42 Dizziness and giddiness: Secondary | ICD-10-CM

## 2012-11-20 DIAGNOSIS — E785 Hyperlipidemia, unspecified: Secondary | ICD-10-CM

## 2012-11-20 MED ORDER — KETOROLAC TROMETHAMINE 60 MG/2ML IJ SOLN
60.0000 mg | Freq: Once | INTRAMUSCULAR | Status: AC
  Administered 2012-11-20: 60 mg via INTRAMUSCULAR

## 2012-11-20 NOTE — Patient Instructions (Addendum)
F./U in 2 month  You will get toradol 60 mg IM in the office today.  You are referred to Dr Romeo Apple for eval and treatment of left knee due to recurrent falls and instability.  You are referred to cardiology due to falls from light headedness and chest discomfort intermittently, averaging 2 per week, also for a carotid doppler study to evaluate for blockage in neck arteries.  You are referred for a bone density test  Labs today, cbc, iron and ferritin, lipid, chem 7 , TSh and vit D  Please check you pharmacy for the shingles vaccine

## 2012-11-20 NOTE — Progress Notes (Signed)
  Subjective:    Patient ID: [name], female    DOB: 08/23/1941, 72 y.o.   MRN: [id number]  HPI Pt in with a c/o fall 1 week ago, experiencing increased hip pan. She reports worsening left knee pain and instability with a h/o recurrent falls and light headedness which she would like further addressed. Denies chest pain or palpitations. Denies any recent fever , chills and has no symptoms of infection   Review of Systems See HPI Denies recent fever or chills. Denies sinus pressure, nasal congestion, ear pain or sore throat. Denies chest congestion, productive cough or wheezing.Chronic oxygen dependence which is unchanged Denies chest pains, palpitations and leg swelling Denies abdominal pain, nausea, vomiting,diarrhea or constipation.   Denies dysuria, frequency, hesitancy or incontinence. Denies headaches. Denies depression, anxiety or insomnia. Denies skin break down or rash.        Objective:   Physical Exam Patient alert and oriented and in no cardiopulmonary distress.Oxygen dependent and ambulates with assistive device  HEENT: No facial asymmetry, EOMI, no sinus tenderness,  oropharynx pink and moist.  Neck adequate ROM no adenopathy.Bruit  Chest: Clear to auscultation bilaterally.Decreased air entry throughout  CVS: S1, S2 no murmurs, no S3.  ABD: Soft non tender. Bowel sounds normal.  Ext: No edema  MS: decreased  ROM spine, shoulders, hips and knees.  Skin: Intact, no ulcerations or rash noted.  Psych: Good eye contact, normal affect. Memory intact not anxious or depressed appearing.  CNS: CN 2-12 intact, .        Assessment & Plan:

## 2012-11-21 LAB — IRON: Iron: 133 ug/dL (ref 42–145)

## 2012-11-21 LAB — BASIC METABOLIC PANEL
Calcium: 10 mg/dL (ref 8.4–10.5)
Glucose, Bld: 84 mg/dL (ref 70–99)
Sodium: 142 mEq/L (ref 135–145)

## 2012-11-21 LAB — LIPID PANEL
HDL: 63 mg/dL (ref >39)
LDL Cholesterol: 156 mg/dL — ABNORMAL HIGH (ref 0–99)
Total CHOL/HDL Ratio: 3.9 Ratio

## 2012-11-21 LAB — CBC
HCT: 38.2 % (ref 36.0–46.0)
MCV: 78.4 fL (ref 78.0–100.0)
Platelets: 276 10*3/uL (ref 150–400)
RBC: 4.87 MIL/uL (ref 3.87–5.11)
WBC: 6.1 10*3/uL (ref 4.0–10.5)

## 2012-11-21 LAB — TSH: TSH: 0.712 u[IU]/mL (ref 0.350–4.500)

## 2012-11-28 ENCOUNTER — Telehealth: Payer: Self-pay | Admitting: Family Medicine

## 2012-11-30 ENCOUNTER — Telehealth: Payer: Self-pay | Admitting: Family Medicine

## 2012-11-30 ENCOUNTER — Ambulatory Visit: Payer: Medicare PPO | Admitting: Family Medicine

## 2012-11-30 DIAGNOSIS — R42 Dizziness and giddiness: Secondary | ICD-10-CM | POA: Insufficient documentation

## 2012-11-30 DIAGNOSIS — R0989 Other specified symptoms and signs involving the circulatory and respiratory systems: Secondary | ICD-10-CM | POA: Insufficient documentation

## 2012-11-30 DIAGNOSIS — M25562 Pain in left knee: Secondary | ICD-10-CM | POA: Insufficient documentation

## 2012-11-30 NOTE — Telephone Encounter (Signed)
Pls call pt, let her know total and bad cholesterol are high. Needs to reduce fried and fatty foods and red meat, also I recommend starting medication, and have entered tricor, low dose, she had nausea with pravachol, explain this is another type of cholesterol lowering medication. Pls fax the historical tricor to [facility] if she agrees Connect her too referral staff re referals also if sje has not got appts yet pls ( I entered them late)

## 2012-11-30 NOTE — Assessment & Plan Note (Signed)
Controlled, no change in medication  

## 2012-11-30 NOTE — Telephone Encounter (Signed)
pls refer, to  Ortho, cardiology and for carotid doppler. Pls let pt know I am sorry , referral is entered late pls put her to nurse re lab results also

## 2012-11-30 NOTE — Assessment & Plan Note (Signed)
Clinically stable at this time.

## 2012-11-30 NOTE — Assessment & Plan Note (Signed)
Uncontrolled,   Hyperlipidemia:Low fat diet discussed and encouraged.  Pt to be offered a trial of tricor

## 2012-12-01 ENCOUNTER — Other Ambulatory Visit: Payer: Self-pay

## 2012-12-01 DIAGNOSIS — E785 Hyperlipidemia, unspecified: Secondary | ICD-10-CM

## 2012-12-01 MED ORDER — FENOFIBRATE 48 MG PO TABS: 48.0000 mg | ORAL_TABLET | Freq: Every day | ORAL | Status: DC

## 2012-12-01 NOTE — Telephone Encounter (Signed)
Pt aware.

## 2012-12-04 ENCOUNTER — Ambulatory Visit (HOSPITAL_COMMUNITY): Payer: Medicare PPO

## 2012-12-06 ENCOUNTER — Encounter: Payer: Self-pay | Admitting: *Deleted

## 2012-12-07 ENCOUNTER — Other Ambulatory Visit (HOSPITAL_COMMUNITY): Payer: Medicare PPO

## 2012-12-07 ENCOUNTER — Ambulatory Visit (HOSPITAL_COMMUNITY)
Admission: RE | Admit: 2012-12-07 | Discharge: 2012-12-07 | Disposition: A | Discharge status: Home / Self Care | Payer: Medicare PPO | Source: Ambulatory Visit | Attending: Family Medicine | Admitting: Family Medicine

## 2012-12-07 ENCOUNTER — Encounter: Payer: Self-pay | Admitting: Cardiovascular Disease

## 2012-12-07 ENCOUNTER — Ambulatory Visit (INDEPENDENT_AMBULATORY_CARE_PROVIDER_SITE_OTHER): Payer: Medicare PPO | Admitting: Cardiovascular Disease

## 2012-12-07 VITALS — BP 120/72 | HR 98 | Ht 73.0 in | Wt 244.0 lb

## 2012-12-07 DIAGNOSIS — R55 Syncope and collapse: Secondary | ICD-10-CM

## 2012-12-07 DIAGNOSIS — I10 Essential (primary) hypertension: Secondary | ICD-10-CM | POA: Insufficient documentation

## 2012-12-07 DIAGNOSIS — R0902 Hypoxemia: Secondary | ICD-10-CM

## 2012-12-07 DIAGNOSIS — R0989 Other specified symptoms and signs involving the circulatory and respiratory systems: Secondary | ICD-10-CM | POA: Insufficient documentation

## 2012-12-07 DIAGNOSIS — R42 Dizziness and giddiness: Secondary | ICD-10-CM | POA: Insufficient documentation

## 2012-12-07 DIAGNOSIS — E785 Hyperlipidemia, unspecified: Secondary | ICD-10-CM

## 2012-12-07 NOTE — Assessment & Plan Note (Signed)
Cholesterol is at goal.  Continue current dose of statin and diet Rx.  No myalgias or side effects.  F/U  LFT's in 6 months. Lab Results  Component Value Date   LDLCALC 156* 11/20/2012

## 2012-12-07 NOTE — Assessment & Plan Note (Signed)
No evidence of cardiac issue.  Not postural in clinic today.  No palpitations  Carotids already ordered.  ECG is fine with no arrhythmia or signs of heart block  Echo to r/o structural heart disease

## 2012-12-07 NOTE — Patient Instructions (Signed)
Your physician recommends that you schedule a follow-up appointment in:  As needed  Your physician has requested that you have an echocardiogram. Echocardiography is a painless test that uses sound waves to create images of your heart. It provides your doctor with information about the size and shape of your heart and how well your heart's chambers and valves are working. This procedure takes approximately one hour. There are no restrictions for this procedure.    

## 2012-12-07 NOTE — Assessment & Plan Note (Signed)
Well controlled.  Continue current medications and low sodium Dash type diet.    

## 2012-12-07 NOTE — Progress Notes (Signed)
Patient ID: [name], female   DOB: 1941/02/15, 72 y.o.   MRN: [id number] 72 yo referred by Dr Lodema Hong for frequent falls ? Syncope.  The patient has significant comorbidities.  She has been on oxygen since 2009 for COPD.  Wears it continuously.  She has poor balance and uses a walker to ambulate.  Most of her falls seem mechanical.  No palpitations or chest pain preceding.  Does have some dizzyness but not always postural  Has carotids already scheduled.  No focal neuro signs and no history of seizures.    ROS: Denies fever, malais, weight loss, blurry vision, decreased visual acuity, cough, sputum, SOB, hemoptysis, pleuritic pain, palpitaitons, heartburn, abdominal pain, melena, lower extremity edema, claudication, or rash.  All other systems reviewed and negative   General: Affect appropriate Overweight black female with oxygen on HEENT: normal Neck supple with no adenopathy JVP normal no bruits no thyromegaly Lungs clear with no wheezing poor air movement  Heart:  S1/S2 no murmur,rub, gallop or click PMI normal Abdomen: benighn, BS positve, no tenderness, no AAA no bruit.  No HSM or HJR Distal pulses intact with no bruits No edema Neuro non-focal Skin warm and dry No muscular weakness  Medications Current Outpatient Prescriptions  Medication Sig Dispense Refill  . acetaminophen (TYLENOL) 500 MG tablet Take 1,000 mg by mouth 2 (two) times daily.      [name] arformoterol (BROVANA) 15 MCG/2ML NEBU Take 2 mLs (15 mcg total) by nebulization 2 (two) times daily.  360 mL  1  . calcium-vitamin D (OSCAL WITH D) 500-200 MG-UNIT per tablet Take 1 tablet by mouth daily.       [name] FLUoxetine (PROZAC) 40 MG capsule Take 1 capsule (40 mg total) by mouth daily.  90 capsule  1  . HYDROcodone-acetaminophen (NORCO) 5-325 MG per tablet Take 1 tablet by mouth every 6 (six) hours as needed for pain.  40 tablet  0  . Multiple Vitamins-Minerals (CEROVITE SENIOR) TABS Take 1 tablet by mouth daily.         [name] omeprazole (PRILOSEC) 40 MG capsule Take 1 capsule (40 mg total) by mouth daily.  90 capsule  1  . verapamil (CALAN-SR) 240 MG CR tablet Take 1 tablet (240 mg total) by mouth at bedtime.  90 tablet  1  . fenofibrate (TRICOR) 48 MG tablet Take 1 tablet (48 mg total) by mouth daily.  90 tablet  3    Allergies Pravastatin sodium; Sulfonamide derivatives; and Ciprofloxacin  Family History: Family History  Problem Relation Age of Onset  . Stomach cancer Father   . Breast cancer Father   . Cancer Father   . Cancer Sister   . Hypertension Son   . Cancer Mother   . Arthritis      Social History: History   Social History  . Marital Status: Widowed    Spouse Name: N/A    Number of Children: 4  . Years of Education: N/A   Occupational History  . employed in the tobacco industry     Social History Main Topics  . Smoking status: Former Smoker -- 1.0 packs/day for 40 years    Types: Cigarettes    Quit date: 06/21/2006  . Smokeless tobacco: Never Used  . Alcohol Use: No  . Drug Use: No  . Sexually Active: No   Other Topics Concern  . Not on file   Social History Narrative  . No narrative on file    Electrocardiogram:  SR rate  91 poor R wave progression 08/24/12  Assessment and Plan

## 2012-12-07 NOTE — Assessment & Plan Note (Signed)
Continue oxygen Consider referral to [facility] for long term f/u

## 2012-12-21 ENCOUNTER — Ambulatory Visit (HOSPITAL_COMMUNITY)
Admission: RE | Admit: 2012-12-21 | Discharge: 2012-12-21 | Disposition: A | Discharge status: Home / Self Care | Payer: Medicare PPO | Source: Ambulatory Visit | Attending: Cardiovascular Disease | Admitting: Cardiovascular Disease

## 2012-12-21 DIAGNOSIS — R0989 Other specified symptoms and signs involving the circulatory and respiratory systems: Secondary | ICD-10-CM | POA: Insufficient documentation

## 2012-12-21 DIAGNOSIS — I10 Essential (primary) hypertension: Secondary | ICD-10-CM | POA: Insufficient documentation

## 2012-12-21 DIAGNOSIS — J449 Chronic obstructive pulmonary disease, unspecified: Secondary | ICD-10-CM | POA: Insufficient documentation

## 2012-12-21 DIAGNOSIS — J4489 Other specified chronic obstructive pulmonary disease: Secondary | ICD-10-CM | POA: Insufficient documentation

## 2012-12-21 DIAGNOSIS — R55 Syncope and collapse: Secondary | ICD-10-CM | POA: Insufficient documentation

## 2012-12-21 DIAGNOSIS — I517 Cardiomegaly: Secondary | ICD-10-CM

## 2012-12-21 DIAGNOSIS — R0609 Other forms of dyspnea: Secondary | ICD-10-CM | POA: Insufficient documentation

## 2012-12-21 NOTE — Progress Notes (Signed)
*  PRELIMINARY RESULTS* Echocardiogram 2D Echocardiogram has been performed.  [name] 12/21/2012, 11:55 AM

## 2013-01-01 ENCOUNTER — Telehealth: Payer: Self-pay | Admitting: Family Medicine

## 2013-01-01 DIAGNOSIS — K219 Gastro-esophageal reflux disease without esophagitis: Secondary | ICD-10-CM

## 2013-01-01 DIAGNOSIS — I10 Essential (primary) hypertension: Secondary | ICD-10-CM

## 2013-01-01 DIAGNOSIS — F329 Major depressive disorder, single episode, unspecified: Secondary | ICD-10-CM

## 2013-01-02 MED ORDER — OMEPRAZOLE 40 MG PO CPDR: 40.0000 mg | DELAYED_RELEASE_CAPSULE | Freq: Every day | ORAL | Status: DC

## 2013-01-02 MED ORDER — VERAPAMIL HCL ER 240 MG PO TBCR: 240.0000 mg | EXTENDED_RELEASE_TABLET | Freq: Every day | ORAL | Status: DC

## 2013-01-02 MED ORDER — FLUOXETINE HCL 40 MG PO CAPS: 40.0000 mg | ORAL_CAPSULE | Freq: Every day | ORAL | Status: DC

## 2013-01-02 NOTE — Telephone Encounter (Signed)
meds sent to [facility]

## 2013-01-18 ENCOUNTER — Encounter: Payer: Self-pay | Admitting: *Deleted

## 2013-02-15 ENCOUNTER — Telehealth: Payer: Self-pay

## 2013-02-15 ENCOUNTER — Other Ambulatory Visit: Payer: Self-pay

## 2013-02-15 ENCOUNTER — Ambulatory Visit (INDEPENDENT_AMBULATORY_CARE_PROVIDER_SITE_OTHER): Payer: Medicare PPO | Admitting: Gastroenterology

## 2013-02-15 ENCOUNTER — Encounter: Payer: Self-pay | Admitting: Gastroenterology

## 2013-02-15 VITALS — BP 137/77 | HR 97 | Temp 98.0°F | Ht 73.0 in | Wt 245.2 lb

## 2013-02-15 DIAGNOSIS — D369 Benign neoplasm, unspecified site: Secondary | ICD-10-CM

## 2013-02-15 DIAGNOSIS — Z86010 Personal history of colonic polyps: Secondary | ICD-10-CM

## 2013-02-15 MED ORDER — PEG-KCL-NACL-NASULF-NA ASC-C 100 G PO SOLR: 1.0000 | ORAL | Status: DC

## 2013-02-15 NOTE — Assessment & Plan Note (Signed)
72 year old female status post colonoscopy by Dr. Jena Gauss in Sept 2013 noting: multiple colonic polyps with large polyps on right side, s/p saline-assisted debulking piecemeal polypectomy and ablation. Not all removed. Path with tubulovillous adenomas, high grade dysplasia. She then underwent a right-sided hemicolectomy due to the presence of polyps unable to be removed. This was by Dr. Lovell Sheehan. Due for 6 mos f/u surveillance colonoscopy. Her only complaint is of occasional loose stool after eating greasy/fatty foods. Otherwise, no changes in bowel habits. Likely this is secondary to post-procedure state and dietary habits. Discussed importance of limiting these foods, high fiber.   Proceed with TCS with Dr. Jena Gauss in near future: the risks, benefits, and alternatives have been discussed with the patient in detail. The patient states understanding and desires to proceed.

## 2013-02-15 NOTE — Progress Notes (Signed)
Referring Provider: Simpson, Margaret E, MD Primary Care Physician:  Margaret Simpson, MD Primary GI: Dr. Rourk   Chief Complaint  Patient presents with  . Diarrhea  . Gas    HPI:   [name] is a 72-year-old female who underwent a colonoscopy by Dr. Rourk in Sept 2013 noting: multiple colonic polyps with large polyps on right side, s/p saline-assisted debulking piecemeal polypectomy and ablation. Not all removed. Path with tubulovillous adenomas, high grade dysplasia. She then underwent a right-sided hemicolectomy due to the presence of polyps unable to be removed. This was by Dr. Jenkins. She is here to proceed with a surveillance colonoscopy for further evaluation. Denies abdominal pain. States stomach is growling. Large amounts of gas. +loose stools. Takes imodium, sometimes incontinence trying to get to bathroom. No rectal bleeding. However, she later admitted that she will have normal bowel movements if she eats healthy. Greasy and fatty foods cause loose stools. Occasional nausea.   Past Medical History  Diagnosis Date  . Allergic rhinitis   . Anemia   . Anxiety   . Depression   . GERD (gastroesophageal reflux disease)   . Hypertension   . Low back pain   . Arthritis     abnormal gait   . OSA on CPAP   . Degenerative disc disease, lumbar     L5 nerve impingement   . 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 Arm fracture, left   . Onychomycosis   . Ankle fracture, right   . Hyperglycemia   . Mediastinal lymphadenopathy 1/9    resolving   . COPD (chronic obstructive pulmonary disease)     chronic CO2 retention, decreased DLCO   . Cystic acne     adult   . Sleep apnea     stop bang score 6    Past Surgical History  Procedure Laterality Date  . Abdominal hysterectomy  1976    secondary to bleeding   . Oophorectomy  1976  . Epidural steroids    . Orif ankle fracture  01/18/2012    Procedure: OPEN REDUCTION INTERNAL FIXATION (ORIF) ANKLE FRACTURE;   Surgeon: Stanley Harrison, MD;  Location: [facility];  Service: Orthopedics;  Laterality: Left;  . Colonoscopy  07/20/2012    Dr. Rourk: multiple colonic polyps with large polyps on right side, s/p saline-assisted debulking piecemeal polypectomy and ablation. Not all removed. Path with tubulovillous adenomas, high grade dysplasia.   . Appendectomy    . Colon resection  08/18/2012    Procedure: HAND ASSISTED LAPAROSCOPIC COLON RESECTION;  Surgeon: Mark A Jenkins, MD;  Location: [facility];  Service: General;  Laterality: N/A;    Current Outpatient Prescriptions  Medication Sig Dispense Refill  . acetaminophen (TYLENOL) 500 MG tablet Take 1,000 mg by mouth 2 (two) times daily.      . arformoterol (BROVANA) 15 MCG/2ML NEBU Take 2 mLs (15 mcg total) by nebulization 2 (two) times daily.  360 mL  1  . calcium-vitamin D (OSCAL WITH D) 500-200 MG-UNIT per tablet Take 1 tablet by mouth daily.       . fenofibrate (TRICOR) 48 MG tablet Take 1 tablet (48 mg total) by mouth daily.  90 tablet  3  . FLUoxetine (PROZAC) 40 MG capsule Take 1 capsule (40 mg total) by mouth daily.  90 capsule  1  . HYDROcodone-acetaminophen (NORCO) 5-325 MG per tablet Take 1 tablet by mouth every 6 (six) hours as needed for pain.  40 tablet    0  . loperamide (IMODIUM) 2 MG capsule Take 2 mg by mouth 2 (two) times daily as needed for diarrhea or loose stools.      . Multiple Vitamins-Minerals (CEROVITE SENIOR) TABS Take 1 tablet by mouth daily.       . omeprazole (PRILOSEC) 40 MG capsule Take 1 capsule (40 mg total) by mouth daily.  90 capsule  1  . verapamil (CALAN-SR) 240 MG CR tablet Take 1 tablet (240 mg total) by mouth at bedtime.  90 tablet  1   No current facility-administered medications for this visit.    Allergies as of 02/15/2013 - Review Complete 02/15/2013  Allergen Reaction Noted  . Pravastatin sodium Nausea And Vomiting   . Sulfonamide derivatives Other (See Comments) 02/17/2010  . Ciprofloxacin Rash     Family History   Problem Relation Age of Onset  . Stomach cancer Father   . Breast cancer Father   . Cancer Father   . Cancer Sister   . Hypertension Son   . Cancer Mother   . Arthritis      History   Social History  . Marital Status: Widowed    Spouse Name: N/A    Number of Children: 4  . Years of Education: N/A   Occupational History  . employed in the tobacco industry     Social History Main Topics  . Smoking status: Former Smoker -- 1.00 packs/day for 40 years    Types: Cigarettes    Quit date: 06/21/2006  . Smokeless tobacco: Never Used  . Alcohol Use: No  . Drug Use: No  . Sexually Active: No   Other Topics Concern  . None   Social History Narrative  . None    Review of Systems: Gen: Denies fever, chills, anorexia. Denies fatigue, weakness, weight loss.  CV: Denies chest pain, palpitations, syncope, peripheral edema, and claudication. Resp: +SOB, DOE GI: SEE HPI Derm: Denies rash, itching, dry skin Psych: Denies depression, anxiety, memory loss, confusion. No homicidal or suicidal ideation.  Heme: Denies bruising, bleeding, and enlarged lymph nodes.  Physical Exam: BP 137/77  Pulse 97  Temp(Src) 98 F (36.7 C) (Oral)  Ht 6' 1" (1.854 m)  Wt 245 lb 3.2 oz (111.222 kg)  BMI 32.36 kg/m2 General:   Alert and oriented. No distress noted. Pleasant and cooperative. 2liters O2 per nasal cannula Head:  Normocephalic and atraumatic. Eyes:  Conjuctiva clear without scleral icterus. Mouth:  Oral mucosa pink and moist. Good dentition. No lesions. Heart:  S1, S2 present without murmurs, rubs, or gallops. Regular rate and rhythm. Abdomen:  +BS, soft, non-tender and non-distended. No rebound or guarding. No HSM or masses noted. Surgical scar well-healed Msk:  Symmetrical without gross deformities. Normal posture. Extremities:  Without edema. Neurologic:  Alert and  oriented x4;  grossly normal neurologically. Skin:  Intact without significant lesions or rashes. Psych:  Alert  and cooperative. Normal mood and affect.  

## 2013-02-15 NOTE — Progress Notes (Signed)
CC PCP 

## 2013-02-15 NOTE — Telephone Encounter (Signed)
Sending the prescription for the prep to [facility] in [place], pt request.

## 2013-02-15 NOTE — Patient Instructions (Addendum)
We have set you up for a repeat colonoscopy with Dr. Jena Gauss in the near future.  Continue to avoid fatty, greasy foods.

## 2013-02-22 ENCOUNTER — Telehealth: Payer: Self-pay | Admitting: Family Medicine

## 2013-02-22 NOTE — Telephone Encounter (Signed)
Advise fenofibrate IS the generic. Lab in January was excellent, so stop the fenofibrate, ensure keeps fried and fatty food intake down, esp butter , cheese, red meat and fried foods. Needs apprt by June and will need fasting lipid and cmp beforre the visit, ensure this is in place also please

## 2013-02-23 ENCOUNTER — Telehealth: Payer: Self-pay

## 2013-02-23 NOTE — Telephone Encounter (Signed)
Pt does not need PA for screening TCS per automated machine.

## 2013-02-26 ENCOUNTER — Encounter (HOSPITAL_COMMUNITY): Payer: Self-pay | Admitting: Pharmacy Technician

## 2013-03-09 ENCOUNTER — Encounter (HOSPITAL_COMMUNITY): Payer: Self-pay | Admitting: *Deleted

## 2013-03-09 ENCOUNTER — Ambulatory Visit (HOSPITAL_COMMUNITY)
Admission: RE | Admit: 2013-03-09 | Discharge: 2013-03-09 | Disposition: A | Discharge status: Home / Self Care | Payer: Medicare PPO | Source: Ambulatory Visit | Attending: Internal Medicine | Admitting: Internal Medicine

## 2013-03-09 ENCOUNTER — Encounter (HOSPITAL_COMMUNITY)
Admission: RE | Disposition: A | Discharge status: Home / Self Care | Payer: Self-pay | Source: Ambulatory Visit | Attending: Internal Medicine

## 2013-03-09 DIAGNOSIS — Z860100 Personal history of colon polyps, unspecified: Secondary | ICD-10-CM

## 2013-03-09 DIAGNOSIS — K648 Other hemorrhoids: Secondary | ICD-10-CM | POA: Insufficient documentation

## 2013-03-09 DIAGNOSIS — J4489 Other specified chronic obstructive pulmonary disease: Secondary | ICD-10-CM | POA: Insufficient documentation

## 2013-03-09 DIAGNOSIS — Z9049 Acquired absence of other specified parts of digestive tract: Secondary | ICD-10-CM | POA: Insufficient documentation

## 2013-03-09 DIAGNOSIS — Z09 Encounter for follow-up examination after completed treatment for conditions other than malignant neoplasm: Secondary | ICD-10-CM | POA: Insufficient documentation

## 2013-03-09 DIAGNOSIS — D126 Benign neoplasm of colon, unspecified: Secondary | ICD-10-CM | POA: Insufficient documentation

## 2013-03-09 DIAGNOSIS — Z1211 Encounter for screening for malignant neoplasm of colon: Secondary | ICD-10-CM

## 2013-03-09 DIAGNOSIS — I10 Essential (primary) hypertension: Secondary | ICD-10-CM | POA: Insufficient documentation

## 2013-03-09 DIAGNOSIS — J449 Chronic obstructive pulmonary disease, unspecified: Secondary | ICD-10-CM | POA: Insufficient documentation

## 2013-03-09 HISTORY — PX: COLONOSCOPY: SHX5424

## 2013-03-09 SURGERY — COLONOSCOPY
Anesthesia: Moderate Sedation

## 2013-03-09 MED ORDER — MEPERIDINE HCL 100 MG/ML IJ SOLN
INTRAMUSCULAR | Status: DC | PRN
  Administered 2013-03-09: 50 mg via INTRAVENOUS

## 2013-03-09 MED ORDER — MIDAZOLAM HCL 5 MG/5ML IJ SOLN
INTRAMUSCULAR | Status: DC | PRN
  Administered 2013-03-09: 1 mg via INTRAVENOUS
  Administered 2013-03-09: 2 mg via INTRAVENOUS

## 2013-03-09 MED ORDER — MEPERIDINE HCL 100 MG/ML IJ SOLN
INTRAMUSCULAR | Status: AC
  Filled 2013-03-09: qty 2

## 2013-03-09 MED ORDER — STERILE WATER FOR IRRIGATION IR SOLN
Status: DC | PRN
  Administered 2013-03-09: 11:00:00

## 2013-03-09 MED ORDER — MIDAZOLAM HCL 5 MG/5ML IJ SOLN
INTRAMUSCULAR | Status: AC
  Filled 2013-03-09: qty 10

## 2013-03-09 MED ORDER — SODIUM CHLORIDE 0.9 % IV SOLN
INTRAVENOUS | Status: DC
  Administered 2013-03-09: 1000 mL via INTRAVENOUS

## 2013-03-09 MED ORDER — ONDANSETRON HCL 4 MG/2ML IJ SOLN
INTRAMUSCULAR | Status: AC
  Filled 2013-03-09: qty 2

## 2013-03-09 NOTE — H&P (View-Only) (Signed)
Referring Provider: Kerri Perches, MD Primary Care Physician:  Syliva Overman, MD Primary GI: Dr. Jena Gauss   Chief Complaint  Patient presents with  . Diarrhea  . Gas    HPI:   [name] is a 72 year old female who underwent a colonoscopy by Dr. Jena Gauss in Sept 2013 noting: multiple colonic polyps with large polyps on right side, s/p saline-assisted debulking piecemeal polypectomy and ablation. Not all removed. Path with tubulovillous adenomas, high grade dysplasia. She then underwent a right-sided hemicolectomy due to the presence of polyps unable to be removed. This was by Dr. Lovell Sheehan. She is here to proceed with a surveillance colonoscopy for further evaluation. Denies abdominal pain. States stomach is growling. Large amounts of gas. +loose stools. Takes imodium, sometimes incontinence trying to get to bathroom. No rectal bleeding. However, she later admitted that she will have normal bowel movements if she eats healthy. Greasy and fatty foods cause loose stools. Occasional nausea.   Past Medical History  Diagnosis Date  . Allergic rhinitis   . Anemia   . Anxiety   . Depression   . GERD (gastroesophageal reflux disease)   . Hypertension   . Low back pain   . Arthritis     abnormal gait   . OSA on CPAP   . Degenerative disc disease, lumbar     L5 nerve impingement   . 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 Arm fracture, left   . Onychomycosis   . Ankle fracture, right   . Hyperglycemia   . Mediastinal lymphadenopathy 1/9    resolving   . COPD (chronic obstructive pulmonary disease)     chronic CO2 retention, decreased DLCO   . Cystic acne     adult   . Sleep apnea     stop bang score 6    Past Surgical History  Procedure Laterality Date  . Abdominal hysterectomy  1976    secondary to bleeding   . Oophorectomy  1976  . Epidural steroids    . Orif ankle fracture  01/18/2012    Procedure: OPEN REDUCTION INTERNAL FIXATION (ORIF) ANKLE FRACTURE;   Surgeon: Fuller Canada, MD;  Location: [facility];  Service: Orthopedics;  Laterality: Left;  . Colonoscopy  07/20/2012    Dr. Jena Gauss: multiple colonic polyps with large polyps on right side, s/p saline-assisted debulking piecemeal polypectomy and ablation. Not all removed. Path with tubulovillous adenomas, high grade dysplasia.   [name] Appendectomy    . Colon resection  08/18/2012    Procedure: HAND ASSISTED LAPAROSCOPIC COLON RESECTION;  Surgeon: Dalia Heading, MD;  Location: [facility];  Service: General;  Laterality: N/A;    Current Outpatient Prescriptions  Medication Sig Dispense Refill  . acetaminophen (TYLENOL) 500 MG tablet Take 1,000 mg by mouth 2 (two) times daily.      [name] arformoterol (BROVANA) 15 MCG/2ML NEBU Take 2 mLs (15 mcg total) by nebulization 2 (two) times daily.  360 mL  1  . calcium-vitamin D (OSCAL WITH D) 500-200 MG-UNIT per tablet Take 1 tablet by mouth daily.       . fenofibrate (TRICOR) 48 MG tablet Take 1 tablet (48 mg total) by mouth daily.  90 tablet  3  . FLUoxetine (PROZAC) 40 MG capsule Take 1 capsule (40 mg total) by mouth daily.  90 capsule  1  . HYDROcodone-acetaminophen (NORCO) 5-325 MG per tablet Take 1 tablet by mouth every 6 (six) hours as needed for pain.  40 tablet  0  . loperamide (IMODIUM) 2 MG capsule Take 2 mg by mouth 2 (two) times daily as needed for diarrhea or loose stools.      . Multiple Vitamins-Minerals (CEROVITE SENIOR) TABS Take 1 tablet by mouth daily.       [name] omeprazole (PRILOSEC) 40 MG capsule Take 1 capsule (40 mg total) by mouth daily.  90 capsule  1  . verapamil (CALAN-SR) 240 MG CR tablet Take 1 tablet (240 mg total) by mouth at bedtime.  90 tablet  1   No current facility-administered medications for this visit.    Allergies as of 02/15/2013 - Review Complete 02/15/2013  Allergen Reaction Noted  . Pravastatin sodium Nausea And Vomiting   . Sulfonamide derivatives Other (See Comments) 02/17/2010  . Ciprofloxacin Rash     Family History   Problem Relation Age of Onset  . Stomach cancer Father   . Breast cancer Father   . Cancer Father   . Cancer Sister   . Hypertension Son   . Cancer Mother   . Arthritis      History   Social History  . Marital Status: Widowed    Spouse Name: N/A    Number of Children: 4  . Years of Education: N/A   Occupational History  . employed in the tobacco industry     Social History Main Topics  . Smoking status: Former Smoker -- 1.00 packs/day for 40 years    Types: Cigarettes    Quit date: 06/21/2006  . Smokeless tobacco: Never Used  . Alcohol Use: No  . Drug Use: No  . Sexually Active: No   Other Topics Concern  . None   Social History Narrative  . None    Review of Systems: Gen: Denies fever, chills, anorexia. Denies fatigue, weakness, weight loss.  CV: Denies chest pain, palpitations, syncope, peripheral edema, and claudication. Resp: +SOB, DOE GI: SEE HPI Derm: Denies rash, itching, dry skin Psych: Denies depression, anxiety, memory loss, confusion. No homicidal or suicidal ideation.  Heme: Denies bruising, bleeding, and enlarged lymph nodes.  Physical Exam: BP 137/77  Pulse 97  Temp(Src) 98 F (36.7 C) (Oral)  Ht 6\' 1"  (1.854 m)  Wt 245 lb 3.2 oz (111.222 kg)  BMI 32.36 kg/m2 General:   Alert and oriented. No distress noted. Pleasant and cooperative. 2liters O2 per nasal cannula Head:  Normocephalic and atraumatic. Eyes:  Conjuctiva clear without scleral icterus. Mouth:  Oral mucosa pink and moist. Good dentition. No lesions. Heart:  S1, S2 present without murmurs, rubs, or gallops. Regular rate and rhythm. Abdomen:  +BS, soft, non-tender and non-distended. No rebound or guarding. No HSM or masses noted. Surgical scar well-healed Msk:  Symmetrical without gross deformities. Normal posture. Extremities:  Without edema. Neurologic:  Alert and  oriented x4;  grossly normal neurologically. Skin:  Intact without significant lesions or rashes. Psych:  Alert  and cooperative. Normal mood and affect.

## 2013-03-09 NOTE — Op Note (Signed)
[facility] [address] [place], [address]   COLONOSCOPY PROCEDURE REPORT  PATIENT: Gabriella, Luna  MR#:         [id number] BIRTHDATE: 30-Oct-1941 , 71  yrs. old GENDER: Female ENDOSCOPIST: R.  Roetta Sessions, MD FACP [facility] REFERRED BY:  Syliva Overman, M.D.  Franky Macho, M.D. PROCEDURE DATE:  03/09/2013 PROCEDURE:     Colonoscopy with snare polypectomy  INDICATIONS: history of high-grade adenomas (multiple) in the right colon  -  status post right hemicolectomy last year; here for surveillance examination  INFORMED CONSENT:  The risks, benefits, alternatives and imponderables including but not limited to bleeding, perforation as well as the possibility of a missed lesion have been reviewed.  The potential for biopsy, lesion removal, etc. have also been discussed.  Questions have been answered.  All parties agreeable. Please see the history and physical in the medical record for more information.  MEDICATIONS: Versed  3 mg IV and Demerol 50 mg IV in divided doses. Zofran 4 mg  DESCRIPTION OF PROCEDURE:  After a digital rectal exam was performed, the EC-[id number] ([id number])  colonoscope was advanced from the anus through the rectum and colon to the area of the cecum, ileocecal valve and appendiceal orifice.  The cecum was deeply intubated.  These structures were well-seen and photographed for the record.  From the level of the cecum and ileocecal valve, the scope was slowly and cautiously withdrawn.  The mucosal surfaces were carefully surveyed utilizing scope tip deflection to facilitate fold flattening as needed.  The scope was pulled down into the rectum where a thorough examination including retroflexion was performed.    FINDINGS:  Anal canal hemorrhoids; otherwise normal rectum. Status post right hemicolectomy. (1) 4 mm polyp in the descending segment; (1) 1 cm polyp at the splenic flexure; otherwise the remainder of the residual colonic mucosa  appeared normal.  THERAPEUTIC / DIAGNOSTIC MANEUVERS PERFORMED:  The above-mentioned polyps were cold and hot snare removed ,respectively.  COMPLICATIONS: none  CECAL WITHDRAWAL TIME:  not applicable  IMPRESSION:  Colonic polyps-removed as described above. Status post right hemicolectomy  RECOMMENDATIONS:  Follow up on pathology.   _______________________________ eSigned:  R. Roetta Sessions, MD FACP [facility] 03/09/2013 11:19 AM   CC:

## 2013-03-09 NOTE — Interval H&P Note (Signed)
History and Physical Interval Note:  03/09/2013 10:49 AM  [name]  has presented today for surgery, with the diagnosis of Surveillance  The various methods of treatment have been discussed with the patient and family. After consideration of risks, benefits and other options for treatment, the patient has consented to  Procedure(s) with comments: COLONOSCOPY (N/A) - 11:30 AM-moved to 12:15 [name] to notify pt as a surgical intervention .  The patient's history has been reviewed, patient examined, no change in status, stable for surgery.  I have reviewed the patient's chart and labs.  Questions were answered to the patient's satisfaction.    colonoscopy per plan.The risks, benefits, limitations, alternatives and imponderables have been reviewed with the patient. Questions have been answered. All parties are agreeable.

## 2013-03-13 ENCOUNTER — Encounter (HOSPITAL_COMMUNITY): Payer: Self-pay | Admitting: Internal Medicine

## 2013-03-13 ENCOUNTER — Encounter: Payer: Self-pay | Admitting: Internal Medicine

## 2013-03-14 NOTE — Telephone Encounter (Signed)
Called patient and left message for them to return call at the office   

## 2013-03-22 ENCOUNTER — Encounter: Payer: Self-pay | Admitting: Family Medicine

## 2013-03-22 ENCOUNTER — Ambulatory Visit (INDEPENDENT_AMBULATORY_CARE_PROVIDER_SITE_OTHER): Payer: Medicare PPO | Admitting: Family Medicine

## 2013-03-22 VITALS — BP 128/80 | HR 78 | Resp 16 | Ht 73.0 in | Wt 248.0 lb

## 2013-03-22 DIAGNOSIS — F329 Major depressive disorder, single episode, unspecified: Secondary | ICD-10-CM

## 2013-03-22 DIAGNOSIS — K219 Gastro-esophageal reflux disease without esophagitis: Secondary | ICD-10-CM

## 2013-03-22 DIAGNOSIS — J309 Allergic rhinitis, unspecified: Secondary | ICD-10-CM

## 2013-03-22 DIAGNOSIS — J301 Allergic rhinitis due to pollen: Secondary | ICD-10-CM

## 2013-03-22 DIAGNOSIS — M6281 Muscle weakness (generalized): Secondary | ICD-10-CM

## 2013-03-22 DIAGNOSIS — I10 Essential (primary) hypertension: Secondary | ICD-10-CM

## 2013-03-22 DIAGNOSIS — R296 Repeated falls: Secondary | ICD-10-CM

## 2013-03-22 DIAGNOSIS — M549 Dorsalgia, unspecified: Secondary | ICD-10-CM

## 2013-03-22 DIAGNOSIS — Z9181 History of falling: Secondary | ICD-10-CM

## 2013-03-22 DIAGNOSIS — R0602 Shortness of breath: Secondary | ICD-10-CM

## 2013-03-22 DIAGNOSIS — J449 Chronic obstructive pulmonary disease, unspecified: Secondary | ICD-10-CM

## 2013-03-22 DIAGNOSIS — E785 Hyperlipidemia, unspecified: Secondary | ICD-10-CM

## 2013-03-22 MED ORDER — OMEPRAZOLE 40 MG PO CPDR: 40.0000 mg | DELAYED_RELEASE_CAPSULE | Freq: Every day | ORAL | Status: DC

## 2013-03-22 MED ORDER — FLUTICASONE PROPIONATE 50 MCG/ACT NA SUSP: 1.0000 | Freq: Every day | NASAL | Status: DC

## 2013-03-22 MED ORDER — FLUOXETINE HCL 40 MG PO CAPS: 40.0000 mg | ORAL_CAPSULE | Freq: Every day | ORAL | Status: DC

## 2013-03-22 MED ORDER — VERAPAMIL HCL ER 240 MG PO TBCR: 240.0000 mg | EXTENDED_RELEASE_TABLET | Freq: Every day | ORAL | Status: DC

## 2013-03-22 NOTE — Patient Instructions (Addendum)
Annual wellness in 4.5 month, please call if you need me before  Toradol 60mg  IM and depo medrol 40mg  IM today for back and knee pain.  Please be careful NOT to fall. You are referred to Dr Gerilyn Pilgrim for a June or after appointment for re evaluation re recurrent falls  Your mammogram is due after June 16, please schedule  Check with [facility], [facility] or [facility] for the shingles vaccine you need this

## 2013-03-22 NOTE — Progress Notes (Signed)
  Subjective:    Patient ID: [name], female    DOB: [birth year], 72 y.o.   MRN: [id number]  HPI Larey Seat 2 days ago and has a scar across her forehead, continues to have recurrent falls , states that she will have one of her children living with her in the next month No h/o memory loss or other neurologic deficit associated with the fall.  Cardiology evaluated her in January to see if there as underlying cardiac pathology contributing to the falls, none evident . She is referred for neurology eval which she has had in the past. Recent colonoscopy revealed multiple plyps so she has rept upcoming re evaluation in the near future Review of Systems See HPI Denies recent fever or chills. C/o increased allergy symtoms, sneezing, nasal congestion, watery eyes,denies  ear pain or sore throat. Denies chest congestion, productive cough or wheezing.Chronic oxygen dependence Denies chest pains, palpitations and leg swelling Denies abdominal pain, nausea, vomiting,diarrhea or constipation.   Denies dysuria, frequency, hesitancy or incontinence. Chronic generalized osteoarthritis, walker depenedent Denies headaches, seizures, numbness, or tingling. Denies uncontrolled depression, anxiety or insomnia. Denies skin break down or rash.        Objective:   Physical Exam        Assessment & Plan:

## 2013-03-23 DIAGNOSIS — M549 Dorsalgia, unspecified: Secondary | ICD-10-CM

## 2013-03-23 MED ORDER — METHYLPREDNISOLONE ACETATE 40 MG/ML IJ SUSP
40.0000 mg | Freq: Once | INTRAMUSCULAR | Status: AC
  Administered 2013-03-23: 40 mg via INTRAMUSCULAR

## 2013-03-23 MED ORDER — KETOROLAC TROMETHAMINE 60 MG/2ML IM SOLN
60.0000 mg | Freq: Once | INTRAMUSCULAR | Status: AC
  Administered 2013-03-23: 60 mg via INTRAMUSCULAR

## 2013-03-23 NOTE — Telephone Encounter (Signed)
Pt seen in the office yesterday

## 2013-03-25 NOTE — Assessment & Plan Note (Signed)
Controlled, no change in medication  

## 2013-03-25 NOTE — Assessment & Plan Note (Signed)
Unchanged, continue supplemental oxygen and neb treatments

## 2013-03-25 NOTE — Assessment & Plan Note (Signed)
Hyperlipidemia:Low fat diet discussed and encouraged.  [name] of meds

## 2013-03-25 NOTE — Assessment & Plan Note (Signed)
Oxygen dependent.  

## 2013-03-25 NOTE — Assessment & Plan Note (Signed)
Recent fall less than 1 week ago. The need to live in a safe environment again stressed. Pt reports that one of her daughters is moving in with her next month, relocating from [place]. She is referred for neurology to re eval her for unsteady gait.Clean laceration to forehead, length approx 2 inches. No memory loss associated with recent fall, neurologic exam unchanged from baseline

## 2013-03-25 NOTE — Assessment & Plan Note (Signed)
Uncontrolled , add flonase 

## 2013-04-18 NOTE — Assessment & Plan Note (Signed)
Generalized muscle weakness, with recurrent falls, will benefit and needs physical therapy for strengthening

## 2013-04-18 NOTE — Progress Notes (Signed)
  Subjective:    Patient ID: [name], female    DOB: 1940/12/02, 72 y.o.   MRN: [id number]  HPI    Review of Systems     Objective:   Physical Exam Patient alert and oriented requires supplemental oxygen at rest HEENT: No facial asymmetry, EOMI, no sinus tenderness,  oropharynx pink and moist.  Neck decreased ROM no adenopathy.  Chest: decreased air entry bilaterally, no crackles, few wheezes CVS: S1, S2 no murmurs, no S3.  ABD: Soft non tender. Bowel sounds normal.  Ext: No edema  MS: decreased  ROM spine, shoulders, hips and knees.Abnormal gait, relies on walker for safe ambulation  Skin: Intact, no ulcerations or rash noted.  Psych: Good eye contact, normal affect. Memory intact not anxious or depressed appearing.  CNS: CN 2-12 intact, power, and tone decreased in upper and lower extremities        Assessment & Plan:

## 2013-04-23 ENCOUNTER — Telehealth: Payer: Self-pay | Admitting: Family Medicine

## 2013-04-24 NOTE — Telephone Encounter (Signed)
[name] and gave verbal ok for OT

## 2013-06-05 ENCOUNTER — Other Ambulatory Visit: Payer: Self-pay | Admitting: Family Medicine

## 2013-06-05 DIAGNOSIS — Z139 Encounter for screening, unspecified: Secondary | ICD-10-CM

## 2013-06-07 ENCOUNTER — Ambulatory Visit (HOSPITAL_COMMUNITY)
Admission: RE | Admit: 2013-06-07 | Discharge: 2013-06-07 | Disposition: A | Discharge status: Home / Self Care | Payer: Medicare PPO | Source: Ambulatory Visit | Attending: Family Medicine | Admitting: Family Medicine

## 2013-06-07 DIAGNOSIS — Z1231 Encounter for screening mammogram for malignant neoplasm of breast: Secondary | ICD-10-CM | POA: Insufficient documentation

## 2013-06-07 DIAGNOSIS — Z139 Encounter for screening, unspecified: Secondary | ICD-10-CM

## 2013-06-14 ENCOUNTER — Telehealth: Payer: Self-pay | Admitting: Family Medicine

## 2013-06-14 DIAGNOSIS — K219 Gastro-esophageal reflux disease without esophagitis: Secondary | ICD-10-CM

## 2013-06-14 DIAGNOSIS — J309 Allergic rhinitis, unspecified: Secondary | ICD-10-CM

## 2013-06-14 DIAGNOSIS — F329 Major depressive disorder, single episode, unspecified: Secondary | ICD-10-CM

## 2013-06-14 DIAGNOSIS — I10 Essential (primary) hypertension: Secondary | ICD-10-CM

## 2013-06-14 DIAGNOSIS — F32A Depression, unspecified: Secondary | ICD-10-CM

## 2013-06-14 MED ORDER — OMEPRAZOLE 40 MG PO CPDR: 40.0000 mg | DELAYED_RELEASE_CAPSULE | Freq: Every day | ORAL | Status: DC

## 2013-06-14 MED ORDER — VERAPAMIL HCL ER 240 MG PO TBCR: 240.0000 mg | EXTENDED_RELEASE_TABLET | Freq: Every day | ORAL | Status: DC

## 2013-06-14 MED ORDER — NAPROXEN 500 MG PO TABS: 500.0000 mg | ORAL_TABLET | Freq: Two times a day (BID) | ORAL | Status: DC

## 2013-06-14 MED ORDER — FLUTICASONE PROPIONATE 50 MCG/ACT NA SUSP: 1.0000 | Freq: Every day | NASAL | Status: DC

## 2013-06-14 MED ORDER — FLUOXETINE HCL 40 MG PO CAPS: 40.0000 mg | ORAL_CAPSULE | Freq: Every day | ORAL | Status: DC

## 2013-06-14 NOTE — Telephone Encounter (Signed)
meds to be [facility]ed [facility]

## 2013-06-14 NOTE — Telephone Encounter (Signed)
[name] a letter stating that it is medically necessary for her daughter to come home to help take care of her mother.   [name] is the daughters name.

## 2013-06-15 NOTE — Telephone Encounter (Signed)
pls type letter stating pt needs her daughter to live with her, on medical grounds. I will sign,

## 2013-06-21 ENCOUNTER — Telehealth: Payer: Self-pay | Admitting: Family Medicine

## 2013-06-21 NOTE — Telephone Encounter (Signed)
Letter composed and awaiting signature.  Daughter to come by office and collect.

## 2013-06-21 NOTE — Telephone Encounter (Signed)
Letter complete and awaiting signature.

## 2013-07-19 ENCOUNTER — Telehealth: Payer: Self-pay | Admitting: Family Medicine

## 2013-07-24 NOTE — Telephone Encounter (Signed)
Noted and agree will await arrival of "new form" and review the situation

## 2013-07-25 ENCOUNTER — Telehealth: Payer: Self-pay | Admitting: Family Medicine

## 2013-07-26 NOTE — Telephone Encounter (Signed)
Patient has called back again

## 2013-07-26 NOTE — Telephone Encounter (Signed)
Please advise.  Do you agree with this?

## 2013-07-30 NOTE — Telephone Encounter (Signed)
Form has had change made to # 4 , Exia aware , handed to receptionist for faxing today

## 2013-07-30 NOTE — Telephone Encounter (Signed)
I spoke directly with daughter Eden Emms today, her cell # is [phone] States that job told her she needs a "yes" for #4 , for coverage  I explained I had already stated her Mom needs help for her entire lifetime, she asked if I can date through December 31 as that isd the time alloted , i statted I would and again restate in the same area that needs are lifetime since this is no acute incident when her mother is incapacitated for a specific time eg following surgery. Stated she understands and that is "OK"  Form changed as requested , pls fax

## 2013-07-31 NOTE — Telephone Encounter (Signed)
Faxed over paper [phone] to [facility] at [phone]

## 2013-08-21 ENCOUNTER — Ambulatory Visit (INDEPENDENT_AMBULATORY_CARE_PROVIDER_SITE_OTHER): Payer: Medicare PPO | Admitting: Family Medicine

## 2013-08-21 ENCOUNTER — Encounter: Payer: Self-pay | Admitting: Family Medicine

## 2013-08-21 VITALS — BP 130/80 | HR 87 | Resp 16 | Ht 73.0 in | Wt 252.1 lb

## 2013-08-21 DIAGNOSIS — D649 Anemia, unspecified: Secondary | ICD-10-CM

## 2013-08-21 DIAGNOSIS — E785 Hyperlipidemia, unspecified: Secondary | ICD-10-CM

## 2013-08-21 DIAGNOSIS — I10 Essential (primary) hypertension: Secondary | ICD-10-CM

## 2013-08-21 DIAGNOSIS — E559 Vitamin D deficiency, unspecified: Secondary | ICD-10-CM

## 2013-08-21 DIAGNOSIS — Z0000 Encounter for general adult medical examination without abnormal findings: Secondary | ICD-10-CM

## 2013-08-21 DIAGNOSIS — H547 Unspecified visual loss: Secondary | ICD-10-CM

## 2013-08-21 DIAGNOSIS — Z23 Encounter for immunization: Secondary | ICD-10-CM

## 2013-08-21 NOTE — Progress Notes (Signed)
Subjective:    Patient ID: [name], female    DOB: 02/01/1941, 72 y.o.   MRN: [id number]  HPI    Review of Systems     Objective:   Physical Exam        Assessment & Plan:  Preventive Screening-Counseling & Management   Patient present here today for a Medicare annual wellness visit.   Current Problems (verified)   Medications Prior to Visit Allergies (verified)   PAST HISTORY  Family HistoryNo heart disease , stroke , depression , dementia  Social History Widow x 3years , Mom of 4 living children, 1 stillbirth. No cancer , heart disease, stroke   Risk Factors  Current exercise habits:  Needs to increase movement  Dietary issues discussed:low fat diet , rich in fresh veg and fruit   Cardiac risk factors: no personal or family history  Depression Screen  (Note: if answer to either of the following is "Yes", a more complete depression screening is indicated)   Over the past two weeks, have you felt down, depressed or hopeless? No  Over the past two weeks, have you felt little interest or pleasure in doing things? No  Have you lost interest or pleasure in daily life? No  Do you often feel hopeless? No  Do you cry easily over simple problems? No   Activities of Daily Living  In your present state of health, do you have any difficulty performing the following activities?  Driving?: yes Managing money?: No Feeding yourself?:No Getting from bed to chair?:yes Climbing a flight of stairs?:yes Preparing food and eating?:no Bathing or showering?:No Getting dressed?:No Getting to the toilet?:No Using the toilet?:No Moving around from place to place?:yes uses assisitive device  Fall Risk Assessment In the past year have you fallen or had a near fall?:yes Are you currently taking any medications that make you dizzy?:No   Hearing Difficulties: No Do you often ask people to speak up or repeat themselves?:No Do you experience ringing or noises in  your ears?:No Do you have difficulty understanding soft or whispered voices?:No  Cognitive Testing  Alert? Yes Normal Appearance?Yes  Oriented to person? Yes Place? Yes  Time? Yes  Displays appropriate judgment?Yes  Can read the correct time from a watch face? yes Are you having problems remembering things?No  Advanced Directives have been discussed with the patient?Yes , full code, living will in place   List the Names of Other Physician/Practitioners you currently use: Dr Geoffry Paradise any recent Medical Services you may have received from other than [facility] providers in the past year (date may be approximate).   Assessment:    Annual Wellness Exam   Plan:    During the course of the visit the patient was educated and counseled about appropriate screening and preventive services including:  A healthy diet is rich in fruit, vegetables and whole grains. Poultry fish, nuts and beans are a healthy choice for protein rather then red meat. A low sodium diet and drinking 64 ounces of water daily is generally recommended. Oils and sweet should be limited. Carbohydrates especially for those who are diabetic or overweight, should be limited to 30-45 gram per meal. It is important to eat on a regular schedule, at least 3 times daily. Snacks should be primarily fruits, vegetables or nuts. It is important that you exercise regularly at least 30 minutes 5 times a week. If you develop chest pain, have severe difficulty breathing, or feel very tired, stop exercising immediately and seek  medical attention  Immunization reviewed and updated. Cancer screening reviewed and updated    Patient Instructions (the written plan) was given to the patient.  Medicare Attestation  I have personally reviewed:  The patient's medical and social history  Their use of alcohol, tobacco or illicit drugs  Their current medications and supplements  The patient's functional ability including ADLs,fall risks,  home safety risks, cognitive, and hearing and visual impairment  Diet and physical activities  Evidence for depression or mood disorders  The patient's weight, height, BMI, and visual acuity have been recorded in the chart. I have made referrals, counseling, and provided education to the patient based on review of the above and I have provided the patient with a written personalized care plan for preventive services.

## 2013-08-21 NOTE — Patient Instructions (Addendum)
F/u early February, call if you need me before  Fasting lipid, cmp, Vit D this week  You are referred for eye exam when due

## 2013-08-23 LAB — COMPREHENSIVE METABOLIC PANEL
ALT: 15 U/L (ref 0–35)
AST: 16 U/L (ref 0–37)
Albumin: 4 g/dL (ref 3.5–5.2)
CO2: 34 mEq/L — ABNORMAL HIGH (ref 19–32)
Calcium: 9.8 mg/dL (ref 8.4–10.5)
Chloride: 101 mEq/L (ref 96–112)
Creat: 1.01 mg/dL (ref 0.50–1.10)
Potassium: 4.6 mEq/L (ref 3.5–5.3)
Sodium: 142 mEq/L (ref 135–145)
Total Protein: 6.7 g/dL (ref 6.0–8.3)

## 2013-08-23 LAB — LIPID PANEL
LDL Cholesterol: 146 mg/dL — ABNORMAL HIGH (ref 0–99)
Triglycerides: 110 mg/dL (ref <150)
VLDL: 22 mg/dL (ref 0–40)

## 2013-08-24 LAB — VITAMIN D 25 HYDROXY (VIT D DEFICIENCY, FRACTURES): Vit D, 25-Hydroxy: 34 ng/mL (ref 30–89)

## 2013-08-26 DIAGNOSIS — Z0000 Encounter for general adult medical examination without abnormal findings: Secondary | ICD-10-CM | POA: Insufficient documentation

## 2013-08-26 NOTE — Assessment & Plan Note (Signed)
Improved Hyperlipidemia:Low fat diet discussed and encouraged.  Will benefit from statin will prescribe, pt to be notified then med sent in

## 2013-08-26 NOTE — Assessment & Plan Note (Signed)
Annual wellness as documented. Limitation is due to poor mobility, however , since her daughter is now relocated, less h/o falls, improved  mobility, and no c/o depression \\pt  to set up eye exam when due. End of life issues discussed openly and already has a living will reportedly

## 2013-08-27 ENCOUNTER — Other Ambulatory Visit: Payer: Self-pay

## 2013-08-27 DIAGNOSIS — E785 Hyperlipidemia, unspecified: Secondary | ICD-10-CM

## 2013-08-27 MED ORDER — ATORVASTATIN CALCIUM 10 MG PO TABS: 10.0000 mg | ORAL_TABLET | Freq: Every day | ORAL | Status: DC

## 2013-09-13 ENCOUNTER — Ambulatory Visit (HOSPITAL_COMMUNITY)
Admission: RE | Admit: 2013-09-13 | Discharge: 2013-09-13 | Disposition: A | Discharge status: Home / Self Care | Payer: Medicare PPO | Source: Ambulatory Visit | Attending: Family Medicine | Admitting: Family Medicine

## 2013-09-13 ENCOUNTER — Encounter: Payer: Self-pay | Admitting: Family Medicine

## 2013-09-13 ENCOUNTER — Ambulatory Visit (INDEPENDENT_AMBULATORY_CARE_PROVIDER_SITE_OTHER): Payer: Medicare PPO | Admitting: Family Medicine

## 2013-09-13 VITALS — BP 102/60 | HR 90 | Temp 99.6°F | Resp 18 | Ht 73.0 in | Wt 219.0 lb

## 2013-09-13 DIAGNOSIS — A499 Bacterial infection, unspecified: Secondary | ICD-10-CM

## 2013-09-13 DIAGNOSIS — I10 Essential (primary) hypertension: Secondary | ICD-10-CM

## 2013-09-13 DIAGNOSIS — J441 Chronic obstructive pulmonary disease with (acute) exacerbation: Secondary | ICD-10-CM

## 2013-09-13 DIAGNOSIS — J209 Acute bronchitis, unspecified: Secondary | ICD-10-CM | POA: Insufficient documentation

## 2013-09-13 DIAGNOSIS — B9689 Other specified bacterial agents as the cause of diseases classified elsewhere: Secondary | ICD-10-CM

## 2013-09-13 DIAGNOSIS — F329 Major depressive disorder, single episode, unspecified: Secondary | ICD-10-CM

## 2013-09-13 DIAGNOSIS — Z9181 History of falling: Secondary | ICD-10-CM

## 2013-09-13 DIAGNOSIS — R296 Repeated falls: Secondary | ICD-10-CM

## 2013-09-13 MED ORDER — PENICILLIN V POTASSIUM 500 MG PO TABS: 500.0000 mg | ORAL_TABLET | Freq: Three times a day (TID) | ORAL | Status: AC

## 2013-09-13 MED ORDER — BENZONATATE 100 MG PO CAPS: 100.0000 mg | ORAL_CAPSULE | Freq: Four times a day (QID) | ORAL | Status: DC | PRN

## 2013-09-13 MED ORDER — METHYLPREDNISOLONE ACETATE 80 MG/ML IJ SUSP
80.0000 mg | Freq: Once | INTRAMUSCULAR | Status: AC
  Administered 2013-09-13: 80 mg via INTRAMUSCULAR

## 2013-09-13 MED ORDER — CEFTRIAXONE SODIUM 1 G IJ SOLR
500.0000 mg | Freq: Once | INTRAMUSCULAR | Status: AC
  Administered 2013-09-13: 500 mg via INTRAMUSCULAR

## 2013-09-13 MED ORDER — PREDNISONE (PAK) 5 MG PO TABS: 5.0000 mg | ORAL_TABLET | ORAL | Status: DC

## 2013-09-13 NOTE — Patient Instructions (Signed)
F/u as before, call if you need me before  You are treated for acute bronchitis and flare up of your COPD.  Depo medrol is administered in the office , also Rocephin CXR today  Prednisone, tessalon perles and avelox (an antibiotic) are sent to your local pharmacy Please use wax softener in left ear before next visit, for 3 days so ear can be flushed, there is a lot of wax in that ear.

## 2013-09-13 NOTE — Assessment & Plan Note (Addendum)
Rocephin 500mg  IM in office followed by oral antibiotics and decongestants

## 2013-09-13 NOTE — Progress Notes (Signed)
  Subjective:    Patient ID: [name], female    DOB: Nov 20, 1940, 72 y.o.   MRN: [id number]  HPI  1 week h/o wheeze, cough , chest congestion with yellow sputum. Worse at night, no sore throat or ear pain, nasal drainage is yellow, has had intermittent chills and she believes also fever , though not taken Worried about her breathing, has had to use the nebulizer more often, conceerned she may have pneumonia    Review of Systems See HPI  throat. Denies chest congestion, productive cough or wheezing. Denies chest pains, palpitations and leg swelling Denies abdominal pain, nausea, vomiting,diarrhea or constipation.   Denies dysuria, frequency, hesitancy or incontinence. Chronic  limitation in mobility.Uses assistive device Denies headaches, seizures, numbness, or tingling. Denies depression, anxiety or insomnia. Denies skin break down or rash.        Objective:   Physical Exam Patient alert and oriented and in no cardiopulmonary distress.  HEENT: No facial asymmetry, EOMI, no sinus tenderness,  oropharynx pink and moist.  Neck supple no adenopathy.  Chest: decreased iar entry on supplemental oxygen, no wheeze, crackles in right lung  CVS: S1, S2 no murmurs, no S3.  ABD: Soft non tender. Bowel sounds normal.  Ext: No edema  MS: decreased ROM spine, shoulders, hips and knees.  Skin: Intact, no ulcerations or rash noted.  Psych: Good eye contact, normal affect. Memory intact not anxious or depressed appearing.  CNS: CN 2-12 intact, power,  normal throughout.        Assessment & Plan:

## 2013-09-13 NOTE — Assessment & Plan Note (Addendum)
Depo medrol 80mg  Im in office followed by prednisone dose pack also CXR

## 2013-09-16 NOTE — Assessment & Plan Note (Signed)
Denies any falls in past 6 weeks approx ever since daughter relocated to help with her care

## 2013-09-16 NOTE — Assessment & Plan Note (Signed)
Controlled, no change in medication  

## 2013-09-16 NOTE — Assessment & Plan Note (Signed)
Improved , esp with daughter relocating to help to care for her

## 2013-10-08 ENCOUNTER — Telehealth: Payer: Self-pay

## 2013-10-08 ENCOUNTER — Other Ambulatory Visit: Payer: Self-pay

## 2013-10-08 DIAGNOSIS — I10 Essential (primary) hypertension: Secondary | ICD-10-CM

## 2013-10-08 DIAGNOSIS — F329 Major depressive disorder, single episode, unspecified: Secondary | ICD-10-CM

## 2013-10-08 MED ORDER — FLUOXETINE HCL 40 MG PO CAPS: 40.0000 mg | ORAL_CAPSULE | Freq: Every day | ORAL | Status: DC

## 2013-10-08 MED ORDER — VERAPAMIL HCL ER 240 MG PO TBCR: 240.0000 mg | EXTENDED_RELEASE_TABLET | Freq: Every day | ORAL | Status: DC

## 2013-10-08 NOTE — Telephone Encounter (Signed)
[facility]

## 2013-10-15 ENCOUNTER — Telehealth: Payer: Self-pay | Admitting: Family Medicine

## 2013-10-15 ENCOUNTER — Other Ambulatory Visit: Payer: Self-pay

## 2013-10-15 DIAGNOSIS — K219 Gastro-esophageal reflux disease without esophagitis: Secondary | ICD-10-CM

## 2013-10-15 MED ORDER — OMEPRAZOLE 40 MG PO CPDR: 40.0000 mg | DELAYED_RELEASE_CAPSULE | Freq: Every day | ORAL | Status: DC

## 2013-10-15 NOTE — Telephone Encounter (Signed)
Med refilled.

## 2013-10-17 ENCOUNTER — Telehealth: Payer: Self-pay | Admitting: Family Medicine

## 2013-10-17 NOTE — Telephone Encounter (Signed)
Please advise 

## 2013-10-17 NOTE — Telephone Encounter (Signed)
pls call pt to get toradol 60mg  and depomedrol 80mg  Im in office for pain and document duration of pain symptoms, and pain level

## 2013-10-17 NOTE — Telephone Encounter (Signed)
Patient aware and states that she will come in  12/4

## 2013-10-18 ENCOUNTER — Encounter (INDEPENDENT_AMBULATORY_CARE_PROVIDER_SITE_OTHER): Payer: Self-pay

## 2013-10-18 ENCOUNTER — Ambulatory Visit (INDEPENDENT_AMBULATORY_CARE_PROVIDER_SITE_OTHER): Payer: Medicare PPO

## 2013-10-18 DIAGNOSIS — M159 Polyosteoarthritis, unspecified: Secondary | ICD-10-CM

## 2013-10-18 MED ORDER — METHYLPREDNISOLONE ACETATE 80 MG/ML IJ SUSP
80.0000 mg | Freq: Once | INTRAMUSCULAR | Status: AC
  Administered 2013-10-18: 80 mg via INTRAMUSCULAR

## 2013-10-18 MED ORDER — KETOROLAC TROMETHAMINE 60 MG/2ML IM SOLN
60.0000 mg | Freq: Once | INTRAMUSCULAR | Status: AC
  Administered 2013-10-18: 60 mg via INTRAMUSCULAR

## 2013-10-18 NOTE — Progress Notes (Signed)
Patient in for injection for pain control.  Patient describes pain as 6/10.  Pain is in right shoulder and arm.  Duration 2 weeks.  Has tried ibuprofen with little relief.  Toradol 60mg  IM given.  Depo medrol 80mg  IM given.  No sign or symptom of adverse reaction.  No voiced complaints from injections.

## 2013-10-29 ENCOUNTER — Telehealth: Payer: Self-pay | Admitting: Family Medicine

## 2013-10-30 ENCOUNTER — Encounter: Payer: Self-pay | Admitting: Family Medicine

## 2013-10-30 ENCOUNTER — Encounter (INDEPENDENT_AMBULATORY_CARE_PROVIDER_SITE_OTHER): Payer: Self-pay

## 2013-10-30 ENCOUNTER — Ambulatory Visit (INDEPENDENT_AMBULATORY_CARE_PROVIDER_SITE_OTHER): Payer: Medicare PPO | Admitting: Family Medicine

## 2013-10-30 ENCOUNTER — Ambulatory Visit (HOSPITAL_COMMUNITY)
Admission: RE | Admit: 2013-10-30 | Discharge: 2013-10-30 | Disposition: A | Discharge status: Home / Self Care | Payer: Medicare PPO | Source: Ambulatory Visit | Attending: Family Medicine | Admitting: Family Medicine

## 2013-10-30 VITALS — BP 156/86 | HR 100 | Resp 20 | Ht 73.0 in | Wt 253.0 lb

## 2013-10-30 DIAGNOSIS — I10 Essential (primary) hypertension: Secondary | ICD-10-CM

## 2013-10-30 DIAGNOSIS — D369 Benign neoplasm, unspecified site: Secondary | ICD-10-CM

## 2013-10-30 DIAGNOSIS — M25511 Pain in right shoulder: Secondary | ICD-10-CM

## 2013-10-30 DIAGNOSIS — M25519 Pain in unspecified shoulder: Secondary | ICD-10-CM | POA: Insufficient documentation

## 2013-10-30 DIAGNOSIS — G8929 Other chronic pain: Secondary | ICD-10-CM | POA: Insufficient documentation

## 2013-10-30 DIAGNOSIS — M898X9 Other specified disorders of bone, unspecified site: Secondary | ICD-10-CM | POA: Insufficient documentation

## 2013-10-30 MED ORDER — TRAMADOL HCL 50 MG PO TABS: ORAL_TABLET | ORAL | Status: DC

## 2013-10-30 MED ORDER — IBUPROFEN 800 MG PO TABS: ORAL_TABLET | ORAL | Status: AC

## 2013-10-30 NOTE — Progress Notes (Signed)
   Subjective:    Patient ID: [name], female    DOB: 15-Sep-1941, 72 y.o.   MRN: [id number]  HPI 6 week h/o worsening right shoulder pain, no recent trauma to area, pain limiting mobility, shoulder just "flops " down when she elevates it Recently came in for anti inflammatory injections IM for pain, but reports no relief    Review of Systems See HPI Denies recent fever or chills. Denies sinus pressure, nasal congestion, ear pain or sore throat. Denies chest congestion, productive cough or  Uncontrolled wheezing. Denies chest pains, palpitations and leg swelling Chronic arthritis limits independent ambulation.  Denies skin break down or rash.        Objective:   Physical Exam  Patient alert and oriented and in no cardiopulmonary distress.Patient in pain  HEENT: No facial asymmetry, EOMI, no sinus tenderness,  oropharynx pink and moist.  Neck adequate ROM, no adenopathy.  Chest: Clear to auscultation bilaterally.  CVS: S1, S2 no murmurs, no S3.  ABD: Soft non tender. Bowel sounds normal.  Ext: No edema  MS: decreased  ROM spine, shoulders, hips and knees.Decreased ROM most marked in right shoulder  Skin: Intact, no ulcerations or rash noted.  Psych: Good eye contact, normal affect. Memory intact not anxious or depressed appearing.        Assessment & Plan:

## 2013-10-30 NOTE — Patient Instructions (Addendum)
F/u as before  Ibuprofen 800mg  one daily for 2 weeks  Tramadol 50mg  one to two at bedtime as needed, for shoulder pain  You need an xray today   You are referred to Dr Romeo Apple for further management  [facility] you feel better soon

## 2013-10-30 NOTE — Telephone Encounter (Signed)
Patient confirmed she will be at her 1:30 appt

## 2013-11-02 NOTE — Assessment & Plan Note (Signed)
Deterioration in pain in shoulder ,no significant response to recent anti inflammatory injections. Xray and ortho eval, tramadol for pain

## 2013-11-02 NOTE — Assessment & Plan Note (Addendum)
Elevated BP at visit , but pt states hse has not had morning meds, is in pain and having poor sleep as a result of this. No med change today

## 2013-11-20 ENCOUNTER — Ambulatory Visit: Payer: Medicare PPO | Admitting: Orthopedic Surgery

## 2013-12-03 ENCOUNTER — Telehealth: Payer: Self-pay | Admitting: Family Medicine

## 2013-12-03 NOTE — Telephone Encounter (Signed)
Attempted to call patient back. Line busy. Will try again.

## 2013-12-05 NOTE — Telephone Encounter (Signed)
pls send in zofran 4 mg one at night for nausea , when she takes he tramadol, if not using tramadol every night, then limit the #of zofran per month to approx number of times she takes the tramadol pls...any questions pls ask

## 2013-12-05 NOTE — Telephone Encounter (Signed)
pls see response alreaty sent from before midday

## 2013-12-05 NOTE — Telephone Encounter (Signed)
Patient states that Tramadol is causing nausea and abdominal discomfort.  She is taking at night as prescribed.  She states that it does help with pain but she is having a hard time tolerating nausea.

## 2013-12-06 MED ORDER — ONDANSETRON HCL 4 MG PO TABS: 4.0000 mg | ORAL_TABLET | Freq: Every day | ORAL | Status: DC

## 2013-12-06 NOTE — Telephone Encounter (Signed)
Patient aware.

## 2013-12-06 NOTE — Addendum Note (Signed)
Addended by: Denman George B on: 12/06/2013 10:29 AM   Modules accepted: Orders

## 2013-12-18 ENCOUNTER — Ambulatory Visit: Payer: Medicare PPO | Admitting: Family Medicine

## 2013-12-20 ENCOUNTER — Encounter: Payer: Self-pay | Admitting: Family Medicine

## 2013-12-20 ENCOUNTER — Ambulatory Visit (INDEPENDENT_AMBULATORY_CARE_PROVIDER_SITE_OTHER): Payer: Medicare PPO | Admitting: Family Medicine

## 2013-12-20 ENCOUNTER — Encounter (INDEPENDENT_AMBULATORY_CARE_PROVIDER_SITE_OTHER): Payer: Self-pay

## 2013-12-20 VITALS — BP 130/80 | HR 90 | Resp 16 | Ht 73.0 in | Wt 249.1 lb

## 2013-12-20 DIAGNOSIS — M899 Disorder of bone, unspecified: Secondary | ICD-10-CM

## 2013-12-20 DIAGNOSIS — M4800 Spinal stenosis, site unspecified: Secondary | ICD-10-CM

## 2013-12-20 DIAGNOSIS — R1310 Dysphagia, unspecified: Secondary | ICD-10-CM

## 2013-12-20 DIAGNOSIS — M949 Disorder of cartilage, unspecified: Secondary | ICD-10-CM

## 2013-12-20 DIAGNOSIS — E785 Hyperlipidemia, unspecified: Secondary | ICD-10-CM

## 2013-12-20 DIAGNOSIS — F329 Major depressive disorder, single episode, unspecified: Secondary | ICD-10-CM

## 2013-12-20 DIAGNOSIS — K219 Gastro-esophageal reflux disease without esophagitis: Secondary | ICD-10-CM

## 2013-12-20 DIAGNOSIS — I10 Essential (primary) hypertension: Secondary | ICD-10-CM

## 2013-12-20 DIAGNOSIS — J449 Chronic obstructive pulmonary disease, unspecified: Secondary | ICD-10-CM

## 2013-12-20 DIAGNOSIS — R110 Nausea: Secondary | ICD-10-CM

## 2013-12-20 DIAGNOSIS — F3289 Other specified depressive episodes: Secondary | ICD-10-CM

## 2013-12-20 MED ORDER — ONDANSETRON HCL 4 MG/2ML IJ SOLN
4.0000 mg | Freq: Once | INTRAMUSCULAR | Status: AC
  Administered 2013-12-20: 4 mg via INTRAMUSCULAR

## 2013-12-20 NOTE — Patient Instructions (Signed)
F/u in 4 month, call if you need me before   Zofran in office for  Nausea  You are referred for [place] of gall bladder and to see Dr Gala Romney  Fasting lipid, cmp , H pylori and vit D , today if possible  You will be contacted re covered alternative for brovana

## 2013-12-21 DIAGNOSIS — J449 Chronic obstructive pulmonary disease, unspecified: Secondary | ICD-10-CM | POA: Insufficient documentation

## 2013-12-21 NOTE — Assessment & Plan Note (Signed)
Stable, maintained on supplemental oxygen More affordable alternative for brovana is needed

## 2013-12-21 NOTE — Assessment & Plan Note (Signed)
Progressive dysphagia, GI evaluation

## 2013-12-21 NOTE — Assessment & Plan Note (Signed)
Updated lab needed at/ before next visit. Hyperlipidemia:Low fat diet discussed and encouraged.   

## 2013-12-21 NOTE — Assessment & Plan Note (Signed)
Chronic pain management as before, pt ambulates with assistance

## 2013-12-21 NOTE — Progress Notes (Signed)
   Subjective:    Patient ID: [name], female    DOB: 1941-01-10, 73 y.o.   MRN: [id number]  HPI The PT is here for follow up and re-evaluation of chronic medical conditions, medication management and review of any available recent lab and radiology data.  Preventive health is updated, specifically  Cancer screening and Immunization.   The PT denies any adverse reactions to current medications since the last visit. C/o cost of brovana and wants to change this Specific complaint today is of a 2 week h/o intractable nausea, denies vomit, has chronic loose stool following partial colectomy. Denies fever , chills , myalgias, no other family member is affected. Appetite is good. States zofran is of no benefit. C/o dysphagia worsening over past several weeks, no weight change  Noted. Has to drink water to help food go down    Review of Systems See HPI Denies recent fever or chills. Denies sinus pressure, nasal congestion, ear pain or sore throat. Denies chest congestion or  productive cough , has chronic wheeze and shortness of breath, depends on supplemental oxygen Denies chest pains, palpitations and leg swelling  Denies dysuria, frequency, hesitancy has mild  incontinence. Chronic  joint pain, swelling and limitation in mobility. Denies headaches, seizures, numbness, or tingling. Denies depression, anxiety or insomnia. Denies skin break down or rash.        Objective:   Physical Exam  BP 130/80  Pulse 90  Resp 16  Ht 6\' 1"  (1.854 m)  Wt 249 lb 1.9 oz (113 kg)  BMI 32.87 kg/m2  SpO2 95%   Patient alert and oriented and in no cardiopulmonary distress.  HEENT: No facial asymmetry, EOMI, no sinus tenderness,  oropharynx pink and moist.  Neck supple no adenopathy.  Chest: Clear to auscultation bilaterally.Decreased though adequate air entry throughout  CVS: S1, S2 no murmurs, no S3.  ABD: Soft non tender. Bowel sounds normal.No palpable organomegaly or  mass  Ext: No edema  MS: Adequate though reduced  ROM spine, shoulders, hips and knees.  Skin: Intact, no ulcerations or rash noted.  Psych: Good eye contact, normal affect. Memory intact not anxious or depressed appearing.  CNS: CN 2-12 intact, power, tone and sensation normal throughout.       Assessment & Plan:  Nausea alone Disabling daily nausea x 2 weeks. H/o thickened gall bladder wall, rept imaging study and check H pylori Zofran in office  GERD Progressive dysphagia, GI evaluation  HYPERTENSION Controlled, no change in medication DASH diet and commitment to daily physical activity for a minimum of 20 minutes discussed and encouraged, as a part of hypertension management. The importance of attaining a healthy weight is also discussed.   HYPERLIPIDEMIA Updated lab needed at/ before next visit. Hyperlipidemia:Low fat diet discussed and encouraged.    Spinal stenosis Chronic pain management as before, pt ambulates with assistance  DEPRESSION Controlled, no change in medication   COPD (chronic obstructive pulmonary disease) Stable, maintained on supplemental oxygen More affordable alternative for brovana is needed

## 2013-12-21 NOTE — Assessment & Plan Note (Signed)
Disabling daily nausea x 2 weeks. H/o thickened gall bladder wall, rept imaging study and check H pylori Zofran in office

## 2013-12-21 NOTE — Assessment & Plan Note (Signed)
Controlled, no change in medication DASH diet and commitment to daily physical activity for a minimum of 20 minutes discussed and encouraged, as a part of hypertension management. The importance of attaining a healthy weight is also discussed.

## 2013-12-21 NOTE — Assessment & Plan Note (Signed)
Controlled, no change in medication  

## 2013-12-26 ENCOUNTER — Encounter: Payer: Self-pay | Admitting: Internal Medicine

## 2013-12-27 ENCOUNTER — Telehealth: Payer: Self-pay | Admitting: *Deleted

## 2013-12-27 NOTE — Telephone Encounter (Signed)
Noted and added to her chart

## 2013-12-27 NOTE — Telephone Encounter (Signed)
Pt called wanting to let Dr. Moshe Cipro know she had her shingles shot in [facility]

## 2014-01-03 ENCOUNTER — Ambulatory Visit: Payer: Medicare PPO | Admitting: Family Medicine

## 2014-01-08 ENCOUNTER — Ambulatory Visit: Payer: Medicare PPO | Admitting: Family Medicine

## 2014-01-08 ENCOUNTER — Telehealth: Payer: Self-pay

## 2014-01-08 ENCOUNTER — Ambulatory Visit (HOSPITAL_COMMUNITY): Payer: Medicare PPO | Attending: Family Medicine

## 2014-01-08 MED ORDER — PENICILLIN V POTASSIUM 500 MG PO TABS: 500.0000 mg | ORAL_TABLET | Freq: Three times a day (TID) | ORAL | Status: DC

## 2014-01-08 NOTE — Telephone Encounter (Signed)
Patient had appt today but had to cancel due to weather. For 1 week she has cough and congestion/rattling in her chest. Producing yellow mucus. Had used mucinex and Robitussin. Advised to continue the Robitussin but not the mucinex. Uses [facility] in [place]. Please advise

## 2014-01-08 NOTE — Telephone Encounter (Signed)
Asked nurse to directly speak with pt to let her know that I had sent in medication if she is able to spk with her.

## 2014-01-08 NOTE — Telephone Encounter (Signed)
Pen v sent  To her pharmacy

## 2014-01-11 ENCOUNTER — Ambulatory Visit: Payer: Medicare PPO | Admitting: Gastroenterology

## 2014-01-14 ENCOUNTER — Telehealth: Payer: Self-pay | Admitting: Family Medicine

## 2014-01-14 ENCOUNTER — Other Ambulatory Visit: Payer: Self-pay

## 2014-01-14 DIAGNOSIS — I10 Essential (primary) hypertension: Secondary | ICD-10-CM

## 2014-01-14 DIAGNOSIS — F329 Major depressive disorder, single episode, unspecified: Secondary | ICD-10-CM

## 2014-01-14 DIAGNOSIS — E785 Hyperlipidemia, unspecified: Secondary | ICD-10-CM

## 2014-01-14 DIAGNOSIS — K219 Gastro-esophageal reflux disease without esophagitis: Secondary | ICD-10-CM

## 2014-01-14 DIAGNOSIS — F32A Depression, unspecified: Secondary | ICD-10-CM

## 2014-01-14 MED ORDER — VERAPAMIL HCL ER 240 MG PO TBCR: 240.0000 mg | EXTENDED_RELEASE_TABLET | Freq: Every day | ORAL | Status: DC

## 2014-01-14 MED ORDER — FLUOXETINE HCL 40 MG PO CAPS: 40.0000 mg | ORAL_CAPSULE | Freq: Every day | ORAL | Status: DC

## 2014-01-14 MED ORDER — ATORVASTATIN CALCIUM 10 MG PO TABS: 10.0000 mg | ORAL_TABLET | Freq: Every day | ORAL | Status: DC

## 2014-01-14 MED ORDER — OMEPRAZOLE 40 MG PO CPDR: 40.0000 mg | DELAYED_RELEASE_CAPSULE | Freq: Every day | ORAL | Status: DC

## 2014-01-14 NOTE — Telephone Encounter (Signed)
meds refilled 

## 2014-01-18 ENCOUNTER — Encounter: Payer: Self-pay | Admitting: Gastroenterology

## 2014-01-18 ENCOUNTER — Ambulatory Visit (INDEPENDENT_AMBULATORY_CARE_PROVIDER_SITE_OTHER): Payer: Medicare PPO | Admitting: Gastroenterology

## 2014-01-18 VITALS — BP 125/78 | HR 99 | Temp 97.4°F | Ht 73.0 in | Wt 252.2 lb

## 2014-01-18 DIAGNOSIS — R1310 Dysphagia, unspecified: Secondary | ICD-10-CM

## 2014-01-18 DIAGNOSIS — R1314 Dysphagia, pharyngoesophageal phase: Secondary | ICD-10-CM

## 2014-01-18 DIAGNOSIS — R1319 Other dysphagia: Secondary | ICD-10-CM | POA: Insufficient documentation

## 2014-01-18 DIAGNOSIS — K219 Gastro-esophageal reflux disease without esophagitis: Secondary | ICD-10-CM

## 2014-01-18 NOTE — Assessment & Plan Note (Signed)
73 year old lady with history of chronic GERD who presents with complaints of solid food dysphagia. She feels like food is sticking in her upper chest but even after washing it down she has discomfort remaining for a period of time. Denies any episodes of impaction. Typical heartburn well controlled on Prilosec. No prior upper endoscopy. Recommend EGD with possible dilation in the near future.  I have discussed the risks, alternatives, benefits with regards to but not limited to the risk of reaction to medication, bleeding, infection, perforation and the patient is agreeable to proceed. Written consent to be obtained.  Continue Prilosec for now.

## 2014-01-18 NOTE — Patient Instructions (Signed)
1. Upper endoscopy as scheduled. See separate instructions.  

## 2014-01-18 NOTE — Progress Notes (Signed)
Primary Care Physician: Tula Nakayama, MD  Primary Gastroenterologist:  [name] Cornea, MD   Chief Complaint  Patient presents with  . Dysphagia    HPI: [name] is a 73 y.o. female here for further evaluation of dysphagia. She has a history of chronic GERD for over 5 years. She has been on Prilosec once daily for some time. Over the past several weeks she's noted difficulty swallowing her food. She feels like it sticking in her upper chest. She gets some relief when she washes it down with liquids. Since this started she also developed an upper respiratory infection and is currently on penicillin. Her congestion has improved. She had nausea during this time but this again has improved with the penicillin. She continues to have a couple bowel movements today. Stools tend to be loose or sometimes related to certain 2 she eats and has been this way since she had right hemicolectomy a couple years ago. She denies any melena or rectal bleeding. Denies any abdominal pain. She uses Imodium as needed. Generally her appetite has been good.  No prior upper endoscopy.   Current Outpatient Prescriptions  Medication Sig Dispense Refill  . arformoterol (BROVANA) 15 MCG/2ML NEBU Take 2 mLs (15 mcg total) by nebulization 2 (two) times daily.  360 mL  1  . atorvastatin (LIPITOR) 10 MG tablet Take 1 tablet (10 mg total) by mouth daily.  90 tablet  1  . calcium-vitamin D (OSCAL WITH D) 500-200 MG-UNIT per tablet Take 1 tablet by mouth daily.       [name] FLUoxetine (PROZAC) 40 MG capsule Take 1 capsule (40 mg total) by mouth daily.  90 capsule  1  . fluticasone (FLONASE) 50 MCG/ACT nasal spray Place 1 spray into the nose daily.  16 g  2  . ibuprofen (ADVIL,MOTRIN) 200 MG tablet Take 200 mg by mouth as needed.      . Multiple Vitamins-Minerals (CEROVITE SENIOR) TABS Take 1 tablet by mouth daily.       [name] omeprazole (PRILOSEC) 40 MG capsule Take 1 capsule (40 mg total) by mouth daily.  90  capsule  1  . ondansetron (ZOFRAN) 4 MG tablet Take 1 tablet (4 mg total) by mouth at bedtime.  30 tablet  1  . penicillin v potassium (VEETID) 500 MG tablet Take 1 tablet (500 mg total) by mouth 3 (three) times daily.  30 tablet  0  . traMADol (ULTRAM) 50 MG tablet Two tablets at bedtime for right shoulder pain  60 tablet  0  . verapamil (CALAN-SR) 240 MG CR tablet Take 1 tablet (240 mg total) by mouth at bedtime.  90 tablet  1   No current facility-administered medications for this visit.    Allergies as of 01/18/2014 - Review Complete 01/18/2014  Allergen Reaction Noted  . Fenofibrate Nausea And Vomiting 02/26/2013  . Pravastatin sodium Nausea And Vomiting   . Sulfonamide derivatives Other (See Comments) 02/17/2010  . Ciprofloxacin Rash    Past Medical History  Diagnosis Date  . Allergic rhinitis   . Anemia   . Anxiety   . Depression   . GERD (gastroesophageal reflux disease)   . Hypertension   . Low back pain   . Arthritis     abnormal gait   . OSA on CPAP   . Degenerative disc disease, lumbar     L5 nerve impingement   . 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 Arm  fracture, left   . Onychomycosis   . Ankle fracture, right   . Hyperglycemia   . Mediastinal lymphadenopathy 1/9    resolving   . COPD (chronic obstructive pulmonary disease)     chronic CO2 retention, decreased DLCO   . Cystic acne     adult   . Sleep apnea     stop bang score 6  . History of colon surgery     right hemicolectomy for high-grade adenomas in right colon 2013   Past Surgical History  Procedure Laterality Date  . Abdominal hysterectomy  1976    secondary to bleeding   . Oophorectomy  1976  . Epidural steroids    . Orif ankle fracture  01/18/2012    Procedure: OPEN REDUCTION INTERNAL FIXATION (ORIF) ANKLE FRACTURE;  Surgeon: Arther Abbott, MD;  Location: [facility];  Service: Orthopedics;  Laterality: Left;  . Colonoscopy  07/20/2012    Dr. Gala Romney: multiple colonic polyps  with large polyps on right side, s/p saline-assisted debulking piecemeal polypectomy and ablation. Not all removed. Path with tubulovillous adenomas, high grade dysplasia.   [name] Appendectomy    . Colon resection  08/18/2012    Procedure: HAND ASSISTED LAPAROSCOPIC COLON RESECTION;  Surgeon: Jamesetta So, MD;  Location: [facility];  Service: General;  Laterality: N/A;  . Colonoscopy N/A 03/09/2013    [id number] polyps-tubular adenomas. S/p right hemicolectomy. next tcs 02/2018   Family History  Problem Relation Age of Onset  . Stomach cancer Father   . Breast cancer Father   . Cancer Father   . Cancer Sister   . Hypertension Son   . Cancer Mother   . Arthritis     History   Social History  . Marital Status: Widowed    Spouse Name: N/A    Number of Children: 4  . Years of Education: N/A   Occupational History  . employed in the tobacco industry     Social History Main Topics  . Smoking status: Former Smoker -- 1.50 packs/day for 40 years    Types: Cigarettes    Quit date: 06/21/2006  . Smokeless tobacco: Never Used  . Alcohol Use: No  . Drug Use: No  . Sexual Activity: No   Other Topics Concern  . None   Social History Narrative  . None    ROS:  General: Negative for anorexia, weight loss, fever, chills, fatigue, weakness. ENT: Negative for hoarseness, nasal congestion. CV: Negative for chest pain, angina, palpitations, dyspnea on exertion, peripheral edema.  Respiratory: Negative for dyspnea at rest, dyspnea on exertion, cough, sputum, wheezing.  GI: See history of present illness. GU:  Negative for dysuria, hematuria, urinary incontinence, urinary frequency, nocturnal urination.  Endo: Negative for unusual weight change.    Physical Examination:   BP 125/78  Pulse 99  Temp(Src) 97.4 F (36.3 C) (Oral)  Ht 6\' 1"  (1.854 m)  Wt 252 lb 3.2 oz (114.397 kg)  BMI 33.28 kg/m2  General: Well-nourished, well-developed in no acute distress.  Eyes: No icterus. Mouth:  Oropharyngeal mucosa moist and pink , no lesions erythema or exudate. Lungs: Clear to auscultation bilaterally.  Heart: Regular rate and rhythm, no murmurs rubs or gallops.  Abdomen: Bowel sounds are normal, nontender, nondistended, no hepatosplenomegaly or masses, no abdominal bruits or hernia , no rebound or guarding.   Extremities: No lower extremity edema. No clubbing or deformities. Neuro: Alert and oriented x 4   Skin: Warm and dry, no jaundice.   Psych: Alert  and cooperative, normal mood and affect.

## 2014-01-21 ENCOUNTER — Encounter (HOSPITAL_COMMUNITY): Payer: Self-pay | Admitting: Pharmacy Technician

## 2014-01-21 NOTE — Progress Notes (Signed)
cc'd to pcp 

## 2014-02-06 ENCOUNTER — Encounter (HOSPITAL_COMMUNITY)
Admission: RE | Disposition: A | Discharge status: Home / Self Care | Payer: Self-pay | Source: Ambulatory Visit | Attending: Internal Medicine

## 2014-02-06 ENCOUNTER — Encounter (HOSPITAL_COMMUNITY): Payer: Self-pay | Admitting: *Deleted

## 2014-02-06 ENCOUNTER — Ambulatory Visit (HOSPITAL_COMMUNITY)
Admission: RE | Admit: 2014-02-06 | Discharge: 2014-02-06 | Disposition: A | Discharge status: Home / Self Care | Payer: Medicare PPO | Source: Ambulatory Visit | Attending: Internal Medicine | Admitting: Internal Medicine

## 2014-02-06 DIAGNOSIS — Q391 Atresia of esophagus with tracheo-esophageal fistula: Secondary | ICD-10-CM

## 2014-02-06 DIAGNOSIS — F329 Major depressive disorder, single episode, unspecified: Secondary | ICD-10-CM | POA: Insufficient documentation

## 2014-02-06 DIAGNOSIS — F411 Generalized anxiety disorder: Secondary | ICD-10-CM | POA: Insufficient documentation

## 2014-02-06 DIAGNOSIS — Z79899 Other long term (current) drug therapy: Secondary | ICD-10-CM | POA: Insufficient documentation

## 2014-02-06 DIAGNOSIS — J4489 Other specified chronic obstructive pulmonary disease: Secondary | ICD-10-CM | POA: Insufficient documentation

## 2014-02-06 DIAGNOSIS — R1310 Dysphagia, unspecified: Secondary | ICD-10-CM

## 2014-02-06 DIAGNOSIS — Q393 Congenital stenosis and stricture of esophagus: Secondary | ICD-10-CM

## 2014-02-06 DIAGNOSIS — F3289 Other specified depressive episodes: Secondary | ICD-10-CM | POA: Insufficient documentation

## 2014-02-06 DIAGNOSIS — J449 Chronic obstructive pulmonary disease, unspecified: Secondary | ICD-10-CM | POA: Insufficient documentation

## 2014-02-06 DIAGNOSIS — R1319 Other dysphagia: Secondary | ICD-10-CM

## 2014-02-06 DIAGNOSIS — K219 Gastro-esophageal reflux disease without esophagitis: Secondary | ICD-10-CM

## 2014-02-06 DIAGNOSIS — K222 Esophageal obstruction: Secondary | ICD-10-CM | POA: Insufficient documentation

## 2014-02-06 DIAGNOSIS — I10 Essential (primary) hypertension: Secondary | ICD-10-CM | POA: Insufficient documentation

## 2014-02-06 HISTORY — PX: ESOPHAGOGASTRODUODENOSCOPY (EGD) WITH ESOPHAGEAL DILATION: SHX5812

## 2014-02-06 SURGERY — ESOPHAGOGASTRODUODENOSCOPY (EGD) WITH ESOPHAGEAL DILATION
Anesthesia: Moderate Sedation

## 2014-02-06 MED ORDER — MIDAZOLAM HCL 5 MG/5ML IJ SOLN
INTRAMUSCULAR | Status: DC | PRN
Start: 2014-02-06 — End: 2014-02-06
  Administered 2014-02-06: 1 mg via INTRAVENOUS

## 2014-02-06 MED ORDER — STERILE WATER FOR IRRIGATION IR SOLN
Status: DC | PRN
  Administered 2014-02-06: 10:00:00

## 2014-02-06 MED ORDER — ONDANSETRON HCL 4 MG/2ML IJ SOLN
INTRAMUSCULAR | Status: DC | PRN
  Administered 2014-02-06: 4 mg via INTRAVENOUS

## 2014-02-06 MED ORDER — MEPERIDINE HCL 100 MG/ML IJ SOLN
INTRAMUSCULAR | Status: AC
  Filled 2014-02-06: qty 2

## 2014-02-06 MED ORDER — SODIUM CHLORIDE 0.9 % IV SOLN
INTRAVENOUS | Status: DC
  Administered 2014-02-06: 09:00:00 via INTRAVENOUS

## 2014-02-06 MED ORDER — ONDANSETRON HCL 4 MG/2ML IJ SOLN
INTRAMUSCULAR | Status: AC
  Filled 2014-02-06: qty 2

## 2014-02-06 MED ORDER — MEPERIDINE HCL 100 MG/ML IJ SOLN
INTRAMUSCULAR | Status: DC | PRN
  Administered 2014-02-06: 25 mg via INTRAVENOUS

## 2014-02-06 MED ORDER — LIDOCAINE VISCOUS 2 % MT SOLN
OROMUCOSAL | Status: AC
  Filled 2014-02-06: qty 15

## 2014-02-06 MED ORDER — LIDOCAINE VISCOUS 2 % MT SOLN
OROMUCOSAL | Status: DC | PRN
  Administered 2014-02-06: 3 mL via OROMUCOSAL

## 2014-02-06 MED ORDER — MIDAZOLAM HCL 5 MG/5ML IJ SOLN
INTRAMUSCULAR | Status: AC
  Filled 2014-02-06: qty 10

## 2014-02-06 NOTE — Interval H&P Note (Signed)
History and Physical Interval Note:  02/06/2014 10:09 AM  [name]  has presented today for surgery, with the diagnosis of CHRONIC GERD  AND DYSPHAGIA  The various methods of treatment have been discussed with the patient and family. After consideration of risks, benefits and other options for treatment, the patient has consented to  Procedure(s) with comments: ESOPHAGOGASTRODUODENOSCOPY (EGD) WITH ESOPHAGEAL DILATION (N/A) - 9:30 as a surgical intervention .  The patient's history has been reviewed, patient examined, no change in status, stable for surgery.  I have reviewed the patient's chart and labs.  Questions were answered to the patient's satisfaction.     No change. EGD with dilation as appropriate today.The risks, benefits, limitations, alternatives and imponderables have been reviewed with the patient. Potential for esophageal dilation, biopsy, etc. have also been reviewed.  Questions have been answered. All parties agreeable.  Manus Rudd

## 2014-02-06 NOTE — H&P (View-Only) (Signed)
Primary Care Physician: Tula Nakayama, MD  Primary Gastroenterologist:  [name] Cornea, MD   Chief Complaint  Patient presents with  . Dysphagia    HPI: [name] is a 73 y.o. female here for further evaluation of dysphagia. She has a history of chronic GERD for over 5 years. She has been on Prilosec once daily for some time. Over the past several weeks she's noted difficulty swallowing her food. She feels like it sticking in her upper chest. She gets some relief when she washes it down with liquids. Since this started she also developed an upper respiratory infection and is currently on penicillin. Her congestion has improved. She had nausea during this time but this again has improved with the penicillin. She continues to have a couple bowel movements today. Stools tend to be loose or sometimes related to certain 2 she eats and has been this way since she had right hemicolectomy a couple years ago. She denies any melena or rectal bleeding. Denies any abdominal pain. She uses Imodium as needed. Generally her appetite has been good.  No prior upper endoscopy.   Current Outpatient Prescriptions  Medication Sig Dispense Refill  . arformoterol (BROVANA) 15 MCG/2ML NEBU Take 2 mLs (15 mcg total) by nebulization 2 (two) times daily.  360 mL  1  . atorvastatin (LIPITOR) 10 MG tablet Take 1 tablet (10 mg total) by mouth daily.  90 tablet  1  . calcium-vitamin D (OSCAL WITH D) 500-200 MG-UNIT per tablet Take 1 tablet by mouth daily.       [name] FLUoxetine (PROZAC) 40 MG capsule Take 1 capsule (40 mg total) by mouth daily.  90 capsule  1  . fluticasone (FLONASE) 50 MCG/ACT nasal spray Place 1 spray into the nose daily.  16 g  2  . ibuprofen (ADVIL,MOTRIN) 200 MG tablet Take 200 mg by mouth as needed.      . Multiple Vitamins-Minerals (CEROVITE SENIOR) TABS Take 1 tablet by mouth daily.       [name] omeprazole (PRILOSEC) 40 MG capsule Take 1 capsule (40 mg total) by mouth daily.  90  capsule  1  . ondansetron (ZOFRAN) 4 MG tablet Take 1 tablet (4 mg total) by mouth at bedtime.  30 tablet  1  . penicillin v potassium (VEETID) 500 MG tablet Take 1 tablet (500 mg total) by mouth 3 (three) times daily.  30 tablet  0  . traMADol (ULTRAM) 50 MG tablet Two tablets at bedtime for right shoulder pain  60 tablet  0  . verapamil (CALAN-SR) 240 MG CR tablet Take 1 tablet (240 mg total) by mouth at bedtime.  90 tablet  1   No current facility-administered medications for this visit.    Allergies as of 01/18/2014 - Review Complete 01/18/2014  Allergen Reaction Noted  . Fenofibrate Nausea And Vomiting 02/26/2013  . Pravastatin sodium Nausea And Vomiting   . Sulfonamide derivatives Other (See Comments) 02/17/2010  . Ciprofloxacin Rash    Past Medical History  Diagnosis Date  . Allergic rhinitis   . Anemia   . Anxiety   . Depression   . GERD (gastroesophageal reflux disease)   . Hypertension   . Low back pain   . Arthritis     abnormal gait   . OSA on CPAP   . Degenerative disc disease, lumbar     L5 nerve impingement   . 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 Arm  fracture, left   . Onychomycosis   . Ankle fracture, right   . Hyperglycemia   . Mediastinal lymphadenopathy 1/9    resolving   . COPD (chronic obstructive pulmonary disease)     chronic CO2 retention, decreased DLCO   . Cystic acne     adult   . Sleep apnea     stop bang score 6  . History of colon surgery     right hemicolectomy for high-grade adenomas in right colon 2013   Past Surgical History  Procedure Laterality Date  . Abdominal hysterectomy  1976    secondary to bleeding   . Oophorectomy  1976  . Epidural steroids    . Orif ankle fracture  01/18/2012    Procedure: OPEN REDUCTION INTERNAL FIXATION (ORIF) ANKLE FRACTURE;  Surgeon: Stanley Harrison, MD;  Location: [facility];  Service: Orthopedics;  Laterality: Left;  . Colonoscopy  07/20/2012    Dr. Rourk: multiple colonic polyps  with large polyps on right side, s/p saline-assisted debulking piecemeal polypectomy and ablation. Not all removed. Path with tubulovillous adenomas, high grade dysplasia.   . Appendectomy    . Colon resection  08/18/2012    Procedure: HAND ASSISTED LAPAROSCOPIC COLON RESECTION;  Surgeon: Mark A Jenkins, MD;  Location: [facility];  Service: General;  Laterality: N/A;  . Colonoscopy N/A 03/09/2013    RMR:Colonic polyps-tubular adenomas. S/p right hemicolectomy. next tcs 02/2018   Family History  Problem Relation Age of Onset  . Stomach cancer Father   . Breast cancer Father   . Cancer Father   . Cancer Sister   . Hypertension Son   . Cancer Mother   . Arthritis     History   Social History  . Marital Status: Widowed    Spouse Name: N/A    Number of Children: 4  . Years of Education: N/A   Occupational History  . employed in the tobacco industry     Social History Main Topics  . Smoking status: Former Smoker -- 1.50 packs/day for 40 years    Types: Cigarettes    Quit date: 06/21/2006  . Smokeless tobacco: Never Used  . Alcohol Use: No  . Drug Use: No  . Sexual Activity: No   Other Topics Concern  . None   Social History Narrative  . None    ROS:  General: Negative for anorexia, weight loss, fever, chills, fatigue, weakness. ENT: Negative for hoarseness, nasal congestion. CV: Negative for chest pain, angina, palpitations, dyspnea on exertion, peripheral edema.  Respiratory: Negative for dyspnea at rest, dyspnea on exertion, cough, sputum, wheezing.  GI: See history of present illness. GU:  Negative for dysuria, hematuria, urinary incontinence, urinary frequency, nocturnal urination.  Endo: Negative for unusual weight change.    Physical Examination:   BP 125/78  Pulse 99  Temp(Src) 97.4 F (36.3 C) (Oral)  Ht 6' 1" (1.854 m)  Wt 252 lb 3.2 oz (114.397 kg)  BMI 33.28 kg/m2  General: Well-nourished, well-developed in no acute distress.  Eyes: No icterus. Mouth:  Oropharyngeal mucosa moist and pink , no lesions erythema or exudate. Lungs: Clear to auscultation bilaterally.  Heart: Regular rate and rhythm, no murmurs rubs or gallops.  Abdomen: Bowel sounds are normal, nontender, nondistended, no hepatosplenomegaly or masses, no abdominal bruits or hernia , no rebound or guarding.   Extremities: No lower extremity edema. No clubbing or deformities. Neuro: Alert and oriented x 4   Skin: Warm and dry, no jaundice.   Psych: Alert   and cooperative, normal mood and affect.

## 2014-02-06 NOTE — Op Note (Signed)
[facility] [address] [place], [address]   ENDOSCOPY PROCEDURE REPORT  PATIENT: [name], [name]  MR#: [id number] BIRTHDATE: 04-Dec-1940 , 72  yrs. old GENDER: Female ENDOSCOPIST: R.  [name] Cornea, MD FACP FACG REFERRED BY:  Tula Nakayama, M.D. PROCEDURE DATE:  02/06/2014 PROCEDURE:     EGD with Venia Minks dilation  INDICATIONS:      Esophageal dysphagia; long-standing GERD  INFORMED CONSENT:   The risks, benefits, limitations, alternatives and imponderables have been discussed.  The potential for biopsy, esophogeal dilation, etc. have also been reviewed.  Questions have been answered.  All parties agreeable.  Please see the history and physical in the medical record for more information.  MEDICATIONS: Versed 1 mg IV and Demerol 25 mg IV. Xylocaine gel orally. Zofran 4 mg IV  DESCRIPTION OF PROCEDURE:   The [id number] ([id number])  endoscope was introduced through the mouth and advanced to the second portion of the duodenum without difficulty or limitations.  The mucosal surfaces were surveyed very carefully during advancement of the scope and upon withdrawal.  Retroflexion view of the proximal stomach and esophagogastric junction was performed.      FINDINGS:   Incomplete, noncritical. Schatzki's ring. No Barrett's esophagus. No esophagitis. Stomach empty. Normal gastric mucosa. Patent pylorus. Normal first and second portion of the duodenum.  THERAPEUTIC / DIAGNOSTIC MANEUVERS PERFORMED:  A 54 French Maloney dilator was passed to full insertion easily; A look back revealed no apparent complication related to this maneuver.   COMPLICATIONS:  None  IMPRESSION:  Incomplete Schatzki's ring-status post passage of a Maloney dilator as described above; otherwise normal EGD  RECOMMENDATIONS:  Continue omeprazole 40 mg daily. GERD information provided. Swallowing precautions reviewed. Patient is to call if she has any future difficulty  swallowing.    _______________________________ R. [name] Cornea, MD FACP [facility] eSigned:  R. [name] Cornea, MD FACP [facility] 02/06/2014 10:37 AM     CC:  PATIENT NAME:  Nathalia, Wismer MR#: [id number]

## 2014-02-06 NOTE — Discharge Instructions (Signed)
EGD Discharge instructions Please read the instructions outlined below and refer to this sheet in the next few weeks. These discharge instructions provide you with general information on caring for yourself after you leave the hospital. Your doctor may also give you specific instructions. While your treatment has been planned according to the most current medical practices available, unavoidable complications occasionally occur. If you have any problems or questions after discharge, please call your doctor. ACTIVITY  You may resume your regular activity but move at a slower pace for the next 24 hours.   Take frequent rest periods for the next 24 hours.   Walking will help expel (get rid of) the air and reduce the bloated feeling in your abdomen.   No driving for 24 hours (because of the anesthesia (medicine) used during the test).   You may shower.   Do not sign any important legal documents or operate any machinery for 24 hours (because of the anesthesia used during the test).  NUTRITION  Drink plenty of fluids.   You may resume your normal diet.   Begin with a light meal and progress to your normal diet.   Avoid alcoholic beverages for 24 hours or as instructed by your caregiver.  MEDICATIONS  You may resume your normal medications unless your caregiver tells you otherwise.  WHAT YOU CAN EXPECT TODAY  You may experience abdominal discomfort such as a feeling of fullness or gas pains.  FOLLOW-UP  Your doctor will discuss the results of your test with you.  SEEK IMMEDIATE MEDICAL ATTENTION IF ANY OF THE FOLLOWING OCCUR:  Excessive nausea (feeling sick to your stomach) and/or vomiting.   Severe abdominal pain and distention (swelling).   Trouble swallowing.   Temperature over 101 F (37.8 C).   Rectal bleeding or vomiting of blood.   GERD information provided  Continue omeprazole 40 mg orally daily  Swallowing precautions reviewed.  Call if you have any recurrent  difficulties with swallowing

## 2014-02-12 ENCOUNTER — Encounter (HOSPITAL_COMMUNITY): Payer: Self-pay | Admitting: Internal Medicine

## 2014-03-28 ENCOUNTER — Telehealth: Payer: Self-pay | Admitting: Family Medicine

## 2014-03-28 NOTE — Telephone Encounter (Signed)
Script written, pls follow through, see if seat needs to be added to script and add pls

## 2014-03-28 NOTE — Telephone Encounter (Signed)
Patient is asking for a rx for a rolling walker with seat due to her current walker breaking.  She states that she believes it has been 5 years since she last got one.  She is aware that medicare only pays for this every 5 years.

## 2014-03-29 NOTE — Telephone Encounter (Signed)
Patient's daughter came by office and collected script.  Unable to get walker at [facility].  She is trying to find somewhere that is in network.  She will be intouch

## 2014-03-29 NOTE — Telephone Encounter (Signed)
RX sent to requested supplier [facility]).  Will followup with patient to make sure she is aware.

## 2014-04-12 ENCOUNTER — Other Ambulatory Visit: Payer: Self-pay

## 2014-04-18 ENCOUNTER — Other Ambulatory Visit: Payer: Self-pay | Admitting: Family Medicine

## 2014-04-18 ENCOUNTER — Ambulatory Visit (INDEPENDENT_AMBULATORY_CARE_PROVIDER_SITE_OTHER): Payer: Medicare PPO | Admitting: Family Medicine

## 2014-04-18 ENCOUNTER — Encounter: Payer: Self-pay | Admitting: Family Medicine

## 2014-04-18 ENCOUNTER — Encounter (INDEPENDENT_AMBULATORY_CARE_PROVIDER_SITE_OTHER): Payer: Self-pay

## 2014-04-18 VITALS — BP 114/72 | HR 101 | Resp 16 | Wt 249.0 lb

## 2014-04-18 DIAGNOSIS — Z1239 Encounter for other screening for malignant neoplasm of breast: Secondary | ICD-10-CM

## 2014-04-18 DIAGNOSIS — J4489 Other specified chronic obstructive pulmonary disease: Secondary | ICD-10-CM

## 2014-04-18 DIAGNOSIS — M949 Disorder of cartilage, unspecified: Secondary | ICD-10-CM

## 2014-04-18 DIAGNOSIS — E785 Hyperlipidemia, unspecified: Secondary | ICD-10-CM

## 2014-04-18 DIAGNOSIS — K219 Gastro-esophageal reflux disease without esophagitis: Secondary | ICD-10-CM

## 2014-04-18 DIAGNOSIS — Z13820 Encounter for screening for osteoporosis: Secondary | ICD-10-CM

## 2014-04-18 DIAGNOSIS — F3289 Other specified depressive episodes: Secondary | ICD-10-CM

## 2014-04-18 DIAGNOSIS — F329 Major depressive disorder, single episode, unspecified: Secondary | ICD-10-CM

## 2014-04-18 DIAGNOSIS — I10 Essential (primary) hypertension: Secondary | ICD-10-CM

## 2014-04-18 DIAGNOSIS — M899 Disorder of bone, unspecified: Secondary | ICD-10-CM

## 2014-04-18 DIAGNOSIS — M4800 Spinal stenosis, site unspecified: Secondary | ICD-10-CM

## 2014-04-18 DIAGNOSIS — J449 Chronic obstructive pulmonary disease, unspecified: Secondary | ICD-10-CM

## 2014-04-18 LAB — COMPREHENSIVE METABOLIC PANEL
ALK PHOS: 86 U/L (ref 39–117)
ALT: 15 U/L (ref 0–35)
AST: 17 U/L (ref 0–37)
Albumin: 4.1 g/dL (ref 3.5–5.2)
BILIRUBIN TOTAL: 0.3 mg/dL (ref 0.2–1.2)
BUN: 17 mg/dL (ref 6–23)
CO2: 34 meq/L — AB (ref 19–32)
Calcium: 9.2 mg/dL (ref 8.4–10.5)
Chloride: 100 mEq/L (ref 96–112)
Creat: 1.07 mg/dL (ref 0.50–1.10)
GLUCOSE: 78 mg/dL (ref 70–99)
Potassium: 4.4 mEq/L (ref 3.5–5.3)
Sodium: 141 mEq/L (ref 135–145)
Total Protein: 6.8 g/dL (ref 6.0–8.3)

## 2014-04-18 LAB — CBC WITH DIFFERENTIAL/PLATELET
BASOS ABS: 0 10*3/uL (ref 0.0–0.1)
Basophils Relative: 0 % (ref 0–1)
Eosinophils Absolute: 0.1 10*3/uL (ref 0.0–0.7)
Eosinophils Relative: 2 % (ref 0–5)
HCT: 36.5 % (ref 36.0–46.0)
Hemoglobin: 11.4 g/dL — ABNORMAL LOW (ref 12.0–15.0)
LYMPHS ABS: 1.4 10*3/uL (ref 0.7–4.0)
LYMPHS PCT: 23 % (ref 12–46)
MCH: 25.7 pg — ABNORMAL LOW (ref 26.0–34.0)
MCHC: 31.2 g/dL (ref 30.0–36.0)
MCV: 82.4 fL (ref 78.0–100.0)
Monocytes Absolute: 0.4 10*3/uL (ref 0.1–1.0)
Monocytes Relative: 7 % (ref 3–12)
NEUTROS ABS: 4.3 10*3/uL (ref 1.7–7.7)
Neutrophils Relative %: 68 % (ref 43–77)
PLATELETS: 281 10*3/uL (ref 150–400)
RBC: 4.43 MIL/uL (ref 3.87–5.11)
RDW: 15.6 % — ABNORMAL HIGH (ref 11.5–15.5)
WBC: 6.3 10*3/uL (ref 4.0–10.5)

## 2014-04-18 LAB — TSH: TSH: 0.705 u[IU]/mL (ref 0.350–4.500)

## 2014-04-18 LAB — LIPID PANEL
CHOL/HDL RATIO: 3.7 ratio
Cholesterol: 184 mg/dL (ref 0–200)
HDL: 50 mg/dL (ref >39)
LDL Cholesterol: 105 mg/dL — ABNORMAL HIGH (ref 0–99)
TRIGLYCERIDES: 144 mg/dL (ref <150)
VLDL: 29 mg/dL (ref 0–40)

## 2014-04-18 MED ORDER — ALENDRONATE SODIUM 70 MG PO TABS
70.0000 mg | ORAL_TABLET | ORAL | Status: DC
Start: 2014-04-18 — End: 2015-03-17

## 2014-04-18 NOTE — Patient Instructions (Signed)
Annual physical exam in September , call if you need me before   Labs within the next week, preferably today  You are referre d for bone density test and mammogram 7./27 or after morning appts per request  New for bones is once weekly fosamax

## 2014-04-19 LAB — VITAMIN D 25 HYDROXY (VIT D DEFICIENCY, FRACTURES): Vit D, 25-Hydroxy: 38 ng/mL (ref 30–89)

## 2014-04-19 LAB — IRON: IRON: 94 ug/dL (ref 42–145)

## 2014-04-19 LAB — FERRITIN: Ferritin: 43 ng/mL (ref 10–291)

## 2014-04-21 NOTE — Assessment & Plan Note (Signed)
Stable on chronic meds

## 2014-04-21 NOTE — Assessment & Plan Note (Signed)
Controlled, no change in medication  

## 2014-04-21 NOTE — Assessment & Plan Note (Signed)
Nearly at goal Hyperlipidemia:Low fat diet discussed and encouraged. `No med change

## 2014-04-21 NOTE — Progress Notes (Signed)
   Subjective:    Patient ID: [name], female    DOB: 05/24/1941, 73 y.o.   MRN: [id number]  HPI The PT is here for follow up and re-evaluation of chronic medical conditions, medication management and review of any available recent lab and radiology data.  Preventive health is updated, specifically  Cancer screening and Immunization.   Questions or concerns regarding consultations or procedures which the PT has had in the interim are  addressed. The PT denies any adverse reactions to current medications since the last visit.  There are no new concerns.  There are no specific complaints       Review of Systems See HPI Denies recent fever or chills. Denies sinus pressure, nasal congestion, ear pain or sore throat. Denies chest congestion, productive cough or wheezing. Denies chest pains, palpitations and leg swelling Denies abdominal pain, nausea, vomiting,diarrhea or constipation.   Denies dysuria, frequency, hesitancy or incontinence. Chronic and unchanged joint pain, swelling and limitation in mobility. Denies headaches, seizures, numbness, or tingling. Denies uncontrolled  depression, anxiety or insomnia. Denies skin break down or rash.        Objective:   Physical Exam BP 114/72  Pulse 101  Resp 16  Wt 249 lb (112.946 kg)  SpO2 94% Patient alert and oriented and in no cardiopulmonary distress.Dependent on supplemental oxygen continually  HEENT: No facial asymmetry, EOMI, no sinus tenderness,  oropharynx pink and moist.  Neck decreased though adequate ROM, no jVD, no adenopathy.  Chest: Clear to auscultation bilaterally.Decreased air entry   CVS: S1, S2 no murmurs, no S3.  ABD: Soft non tender.  Ext: No edema  NO:MVEHMCNOB  ROM spine, shoulders, hips and knees.  Skin: Intact, no ulcerations or rash noted.  Psych: Good eye contact, normal affect. Memory intact not anxious or depressed appearing.  CNS: CN 2-12 intact, power, tone and sensation  normal throughout.        Assessment & Plan:  HYPERTENSION Controlled, no change in medication   COPD (chronic obstructive pulmonary disease) Stable on chronic meds  HYPERLIPIDEMIA Nearly at goal Hyperlipidemia:Low fat diet discussed and encouraged. `No med change  Spinal stenosis Ongoing need to ambulate with assistance, with h/o fracture will start bisphosphonate  DEPRESSION Controlled, no change in medication   GERD Controlled, no change in medication

## 2014-04-21 NOTE — Assessment & Plan Note (Signed)
Ongoing need to ambulate with assistance, with h/o fracture will start bisphosphonate

## 2014-04-26 ENCOUNTER — Encounter (HOSPITAL_COMMUNITY): Payer: Self-pay | Admitting: Emergency Medicine

## 2014-04-26 ENCOUNTER — Telehealth: Payer: Self-pay

## 2014-04-26 ENCOUNTER — Emergency Department (HOSPITAL_COMMUNITY)
Admission: EM | Admit: 2014-04-26 | Discharge: 2014-04-26 | Disposition: A | Discharge status: Home / Self Care | Payer: Medicare PPO | Attending: Emergency Medicine | Admitting: Emergency Medicine

## 2014-04-26 DIAGNOSIS — M129 Arthropathy, unspecified: Secondary | ICD-10-CM | POA: Insufficient documentation

## 2014-04-26 DIAGNOSIS — Z9981 Dependence on supplemental oxygen: Secondary | ICD-10-CM | POA: Insufficient documentation

## 2014-04-26 DIAGNOSIS — F411 Generalized anxiety disorder: Secondary | ICD-10-CM | POA: Insufficient documentation

## 2014-04-26 DIAGNOSIS — J449 Chronic obstructive pulmonary disease, unspecified: Secondary | ICD-10-CM | POA: Insufficient documentation

## 2014-04-26 DIAGNOSIS — G4733 Obstructive sleep apnea (adult) (pediatric): Secondary | ICD-10-CM | POA: Insufficient documentation

## 2014-04-26 DIAGNOSIS — R410 Disorientation, unspecified: Secondary | ICD-10-CM

## 2014-04-26 DIAGNOSIS — J4489 Other specified chronic obstructive pulmonary disease: Secondary | ICD-10-CM | POA: Insufficient documentation

## 2014-04-26 DIAGNOSIS — M5137 Other intervertebral disc degeneration, lumbosacral region: Secondary | ICD-10-CM | POA: Insufficient documentation

## 2014-04-26 DIAGNOSIS — K219 Gastro-esophageal reflux disease without esophagitis: Secondary | ICD-10-CM | POA: Insufficient documentation

## 2014-04-26 DIAGNOSIS — E785 Hyperlipidemia, unspecified: Secondary | ICD-10-CM | POA: Insufficient documentation

## 2014-04-26 DIAGNOSIS — F29 Unspecified psychosis not due to a substance or known physiological condition: Secondary | ICD-10-CM | POA: Insufficient documentation

## 2014-04-26 DIAGNOSIS — Z87891 Personal history of nicotine dependence: Secondary | ICD-10-CM | POA: Insufficient documentation

## 2014-04-26 DIAGNOSIS — F329 Major depressive disorder, single episode, unspecified: Secondary | ICD-10-CM | POA: Insufficient documentation

## 2014-04-26 DIAGNOSIS — N390 Urinary tract infection, site not specified: Secondary | ICD-10-CM | POA: Insufficient documentation

## 2014-04-26 DIAGNOSIS — Z8619 Personal history of other infectious and parasitic diseases: Secondary | ICD-10-CM | POA: Insufficient documentation

## 2014-04-26 DIAGNOSIS — Z79899 Other long term (current) drug therapy: Secondary | ICD-10-CM | POA: Insufficient documentation

## 2014-04-26 DIAGNOSIS — F3289 Other specified depressive episodes: Secondary | ICD-10-CM | POA: Insufficient documentation

## 2014-04-26 DIAGNOSIS — I10 Essential (primary) hypertension: Secondary | ICD-10-CM | POA: Insufficient documentation

## 2014-04-26 DIAGNOSIS — Z8781 Personal history of (healed) traumatic fracture: Secondary | ICD-10-CM | POA: Insufficient documentation

## 2014-04-26 DIAGNOSIS — M51379 Other intervertebral disc degeneration, lumbosacral region without mention of lumbar back pain or lower extremity pain: Secondary | ICD-10-CM | POA: Insufficient documentation

## 2014-04-26 DIAGNOSIS — Z872 Personal history of diseases of the skin and subcutaneous tissue: Secondary | ICD-10-CM | POA: Insufficient documentation

## 2014-04-26 LAB — URINE MICROSCOPIC-ADD ON

## 2014-04-26 LAB — CBC WITH DIFFERENTIAL/PLATELET
BASOS ABS: 0 10*3/uL (ref 0.0–0.1)
Basophils Relative: 0 % (ref 0–1)
EOS ABS: 0.2 10*3/uL (ref 0.0–0.7)
EOS PCT: 4 % (ref 0–5)
HCT: 36.8 % (ref 36.0–46.0)
Hemoglobin: 11.4 g/dL — ABNORMAL LOW (ref 12.0–15.0)
LYMPHS ABS: 1.2 10*3/uL (ref 0.7–4.0)
LYMPHS PCT: 21 % (ref 12–46)
MCH: 26.7 pg (ref 26.0–34.0)
MCHC: 31 g/dL (ref 30.0–36.0)
MCV: 86.2 fL (ref 78.0–100.0)
Monocytes Absolute: 0.3 10*3/uL (ref 0.1–1.0)
Monocytes Relative: 5 % (ref 3–12)
NEUTROS PCT: 70 % (ref 43–77)
Neutro Abs: 3.9 10*3/uL (ref 1.7–7.7)
Platelets: 236 10*3/uL (ref 150–400)
RBC: 4.27 MIL/uL (ref 3.87–5.11)
RDW: 14.9 % (ref 11.5–15.5)
WBC: 5.6 10*3/uL (ref 4.0–10.5)

## 2014-04-26 LAB — COMPREHENSIVE METABOLIC PANEL
ALT: 15 U/L (ref 0–35)
AST: 16 U/L (ref 0–37)
Albumin: 3.7 g/dL (ref 3.5–5.2)
Alkaline Phosphatase: 98 U/L (ref 39–117)
BUN: 20 mg/dL (ref 6–23)
CALCIUM: 9.5 mg/dL (ref 8.4–10.5)
CO2: 32 mEq/L (ref 19–32)
Chloride: 100 mEq/L (ref 96–112)
Creatinine, Ser: 0.96 mg/dL (ref 0.50–1.10)
GFR calc Af Amer: 67 mL/min — ABNORMAL LOW (ref >90)
GFR calc non Af Amer: 58 mL/min — ABNORMAL LOW (ref >90)
GLUCOSE: 99 mg/dL (ref 70–99)
Potassium: 4.2 mEq/L (ref 3.7–5.3)
Sodium: 143 mEq/L (ref 137–147)
TOTAL PROTEIN: 7.3 g/dL (ref 6.0–8.3)
Total Bilirubin: 0.4 mg/dL (ref 0.3–1.2)

## 2014-04-26 LAB — URINALYSIS, ROUTINE W REFLEX MICROSCOPIC
Bilirubin Urine: NEGATIVE
GLUCOSE, UA: NEGATIVE mg/dL
HGB URINE DIPSTICK: NEGATIVE
Nitrite: NEGATIVE
PH: 6 (ref 5.0–8.0)
Protein, ur: 30 mg/dL — AB
Urobilinogen, UA: 0.2 mg/dL (ref 0.0–1.0)

## 2014-04-26 MED ORDER — CEPHALEXIN 500 MG PO CAPS: 500.0000 mg | ORAL_CAPSULE | Freq: Four times a day (QID) | ORAL | Status: DC

## 2014-04-26 NOTE — ED Notes (Signed)
No in and out cath.  Pt able to void.

## 2014-04-26 NOTE — Discharge Instructions (Signed)
Urinary Tract Infection  Urinary tract infections (UTIs) can develop anywhere along your urinary tract. Your urinary tract is your body's drainage system for removing wastes and extra water. Your urinary tract includes two kidneys, two ureters, a bladder, and a urethra. Your kidneys are a pair of bean-shaped organs. Each kidney is about the size of your fist. They are located below your ribs, one on each side of your spine.  CAUSES  Infections are caused by microbes, which are microscopic organisms, including fungi, viruses, and bacteria. These organisms are so small that they can only be seen through a microscope. Bacteria are the microbes that most commonly cause UTIs.  SYMPTOMS   Symptoms of UTIs may vary by age and gender of the patient and by the location of the infection. Symptoms in young women typically include a frequent and intense urge to urinate and a painful, burning feeling in the bladder or urethra during urination. Older women and men are more likely to be tired, shaky, and weak and have muscle aches and abdominal pain. A fever may mean the infection is in your kidneys. Other symptoms of a kidney infection include pain in your back or sides below the ribs, nausea, and vomiting.  DIAGNOSIS  To diagnose a UTI, your caregiver will ask you about your symptoms. Your caregiver also will ask to provide a urine sample. The urine sample will be tested for bacteria and white blood cells. White blood cells are made by your body to help fight infection.  TREATMENT   Typically, UTIs can be treated with medication. Because most UTIs are caused by a bacterial infection, they usually can be treated with the use of antibiotics. The choice of antibiotic and length of treatment depend on your symptoms and the type of bacteria causing your infection.  HOME CARE INSTRUCTIONS   If you were prescribed antibiotics, take them exactly as your caregiver instructs you. Finish the medication even if you feel better after you  have only taken some of the medication.   Drink enough water and fluids to keep your urine clear or pale yellow.   Avoid caffeine, tea, and carbonated beverages. They tend to irritate your bladder.   Empty your bladder often. Avoid holding urine for long periods of time.   Empty your bladder before and after sexual intercourse.   After a bowel movement, women should cleanse from front to back. Use each tissue only once.  SEEK MEDICAL CARE IF:    You have back pain.   You develop a fever.   Your symptoms do not begin to resolve within 3 days.  SEEK IMMEDIATE MEDICAL CARE IF:    You have severe back pain or lower abdominal pain.   You develop chills.   You have nausea or vomiting.   You have continued burning or discomfort with urination.  MAKE SURE YOU:    Understand these instructions.   Will watch your condition.   Will get help right away if you are not doing well or get worse.  Document Released: 08/11/2005 Document Revised: 05/02/2012 Document Reviewed: 12/10/2011  ExitCare Patient Information 2014 [employer]  Confusion  Confusion is the inability to think with your usual speed or clarity. Confusion may come on quickly or slowly over time. How quickly the confusion comes on depends on the cause. Confusion can be due to any number of causes.  CAUSES    Concussion, head injury, or head trauma.   Seizures.   Stroke.   Fever.     Senility.   Heightened emotional states like rage or terror.   Mental illness in which the person loses the ability to determine what is real and what is not (hallucinations).   Infections.   Toxic effects from alcohol, drugs, or prescription medicines.   Dehydration and an imbalance of salts in the body (electrolytes).   Lack of sleep.   Low blood sugar (diabetes).   Low levels of oxygen (for example from chronic lung disorders).   Drug interactions or other medication side effects.   Nutritional deficiencies, especially niacin, thiamine, vitamin C, or  vitamin B.   Sudden drop in body temperature (hypothermia).   Illness in the elderly. Constipation can result in confusion. An elderly person who is hospitalized may become confused due to change in daily routine.  SYMPTOMS   People often describe their thinking as cloudy or unclear when they are confused. Confusion can also include feeling disoriented. That means you are unaware of where or who you are. You may also not know what the date or time is. If confused, you may also have difficulty paying attention, remembering and making decisions. Some people also act aggressively when they are confused.   DIAGNOSIS   The medical evaluation of confusion may include:   Blood and urine tests.   X-rays.   Brain and nervous system tests.   Analyzing your brain waves (electroencphalogram or EEG).   A special X-ray (MRI) of your head or other special studies.  Your physician will ask questions such as:   Do you get days and nights mixed up?   Are you awake during regular sleep times?   Do you have trouble recognizing people?   Do you know where you are?   Do you know the date and time?   Does the confusion come and go?   Is the confusion quickly getting worse?   Has there been a recent illness?   Has there been a recent head injury?   Are you diabetic?   Do you have a lung disorder?   What medication are you taking?   Have you taken drugs or alcohol?  TREATMENT   An admission to the hospital may not be needed, but a confused person should not be left alone. Stay with a family member or friend until the confusion clears. Avoid alcohol, pain relievers or sedative drugs until you have fully recovered. Do not drive until your caregiver says it is okay.  HOME CARE INSTRUCTIONS  What family and friends can do:   To find out if someone is confused ask him or her their name, age, and the date. If the person is unsure or answers incorrectly, he or she is confused.   Always introduce yourself, no matter how well  the person knows you.   Often remind the person of his or her location.   Place a calendar and clock near the confused person.   Talk about current events and plans for the day.   Try to keep the environment calm, quiet and peaceful.   Make sure the patient keeps follow up appointments with their physician.  PREVENTION   Ways to prevent confusion:   Avoid alcohol.   Eat a balanced diet.   Get enough sleep.   Do not become isolated. Spend time with other people and make plans for your days.   Keep careful watch on your blood sugar levels if you are diabetic.  SEEK IMMEDIATE MEDICAL CARE IF:    You develop severe   headaches, repeated vomiting, seizures, blackouts or slurred speech.   There is increasing confusion, weakness, numbness, restlessness or personality changes.   You develop a loss of balance, have marked dizziness, feel uncoordinated or fall.   You have delusions, hallucinations or develop severe anxiety.   Your family members think you need to be rechecked.  Document Released: 12/09/2004 Document Revised: 01/24/2012 Document Reviewed: 08/06/2008  [employer] Patient Information 2014 [employer].

## 2014-04-26 NOTE — ED Notes (Signed)
Increased confusion starting yesterday per daughter.  Denies weakness, neuro deficits.  Denies pain.

## 2014-04-26 NOTE — Telephone Encounter (Signed)
Daughter called and stating that her mother has been very confused x several days. It was very bad yesterday and she is worried about her. Didn't know if maybe she had a uti- no definate symptoms though. Advised she be evaluated at the urgent care. Gave directions and telephone information.

## 2014-04-26 NOTE — ED Provider Notes (Signed)
CSN: [id number]     Arrival date & time 04/26/14  3244 History  This chart was scribed for Gabriella Hacker, MD by [name], ED Scribe. The patient was seen in room APA14/[facility]14. Patient's care was started at 9:00 AM.  Chief Complaint  Patient presents with  . Altered Mental Status    The history is provided by a relative and the patient (daughter). No language interpreter was used.    HPI Comments: [name] is a 73 y.o. female with history of COPD, OSA, hypertension who presents to the Emergency Department complaining of increased confusion persistently since yesterday. Patient's daughter states that patient has not been oriented to place for the past day. Daughter also reports that patient has been repeating things multiple times and has not been at her behavioral baseline. Daughter reports that patient has not had a recent change in appetite. Patient uses supplement oxygen at home. Patient denies any pain, shortness of breath, chest pain, abdominal pain, nausea or vomiting. Her daughter has not noticed unilateral weakness, changes in speech, or neurological deficits. Patient has not started any new medications recently. Patient normally ambulates with a walker.   Past Medical History  Diagnosis Date  . Allergic rhinitis   . Anemia   . Anxiety   . Depression   . GERD (gastroesophageal reflux disease)   . Hypertension   . Low back pain   . Arthritis     abnormal gait   . OSA on CPAP   . Degenerative disc disease, lumbar     L5 nerve impingement   . 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 Arm fracture, left   . Onychomycosis   . Ankle fracture, right   . Hyperglycemia   . Mediastinal lymphadenopathy 1/9    resolving   . COPD (chronic obstructive pulmonary disease)     chronic CO2 retention, decreased DLCO   . Cystic acne     adult   . Sleep apnea     stop bang score 6  . History of colon surgery     right hemicolectomy for high-grade adenomas in  right colon 2013   Past Surgical History  Procedure Laterality Date  . Abdominal hysterectomy  1976    secondary to bleeding   . Oophorectomy  1976  . Epidural steroids    . Orif ankle fracture  01/18/2012    Procedure: OPEN REDUCTION INTERNAL FIXATION (ORIF) ANKLE FRACTURE;  Surgeon: Arther Abbott, MD;  Location: [facility];  Service: Orthopedics;  Laterality: Left;  . Colonoscopy  07/20/2012    Dr. Gala Luna: multiple colonic polyps with large polyps on right side, s/p saline-assisted debulking piecemeal polypectomy and ablation. Not all removed. Path with tubulovillous adenomas, high grade dysplasia.   [name] Appendectomy    . Colon resection  08/18/2012    Procedure: HAND ASSISTED LAPAROSCOPIC COLON RESECTION;  Surgeon: Gabriella So, MD;  Location: [facility];  Service: General;  Laterality: N/A;  . Colonoscopy N/A 03/09/2013    WNU:UVOZDGU polyps-tubular adenomas. S/p right hemicolectomy. next tcs 02/2018  . Esophagogastroduodenoscopy (egd) with esophageal dilation N/A 02/06/2014    Procedure: ESOPHAGOGASTRODUODENOSCOPY (EGD) WITH ESOPHAGEAL DILATION;  Surgeon: Gabriella Dolin, MD;  Location: [facility];  Service: Endoscopy;  Laterality: N/A;  9:30   Family History  Problem Relation Age of Onset  . Stomach cancer Father   . Breast cancer Father   . Cancer Father   . Cancer Sister   . Hypertension  Son   . Cancer Mother   . Arthritis     History  Substance Use Topics  . Smoking status: Former Smoker -- 1.50 packs/day for 40 years    Types: Cigarettes    Quit date: 06/21/2006  . Smokeless tobacco: Never Used  . Alcohol Use: No   OB History   Grav Para Term Preterm Abortions TAB SAB Ect Mult Living   5 4 4  1  1         Review of Systems  Constitutional: Negative for fever.  Respiratory: Negative for cough, chest tightness and shortness of breath.   Cardiovascular: Negative for chest pain.  Gastrointestinal: Negative for nausea, vomiting and abdominal pain.  Genitourinary: Negative for  dysuria.  Musculoskeletal: Negative for back pain.  Neurological: Negative for dizziness, speech difficulty, weakness, light-headedness and headaches.  Psychiatric/Behavioral: Positive for confusion.  All other systems reviewed and are negative.     Allergies  Fenofibrate; Pravastatin sodium; Sulfonamide derivatives; and Ciprofloxacin  Home Medications   Prior to Admission medications   Medication Sig Start Date End Date Taking? Authorizing Provider  alendronate (FOSAMAX) 70 MG tablet Take 1 tablet (70 mg total) by mouth once a week. Take with a full glass of water on an empty stomach. 04/18/14   [name], MD  arformoterol (BROVANA) 15 MCG/2ML NEBU Take 2 mLs (15 mcg total) by nebulization 2 (two) times daily. 04/25/12   [name], MD  atorvastatin (LIPITOR) 10 MG tablet Take 1 tablet (10 mg total) by mouth daily. 01/14/14 01/14/15  [name], MD  calcium-vitamin D (OSCAL WITH D) 500-200 MG-UNIT per tablet Take 1 tablet by mouth daily.     Historical Provider, MD  cephALEXin (KEFLEX) 500 MG capsule Take 1 capsule (500 mg total) by mouth 4 (four) times daily. 04/26/14   Gabriella Hacker, MD  FLUoxetine (PROZAC) 40 MG capsule Take 1 capsule (40 mg total) by mouth daily. 01/14/14   [name], MD  fluticasone (FLONASE) 50 MCG/ACT nasal spray Place 1 spray into the nose daily. 06/14/13 06/14/14  [name], MD  ibuprofen (ADVIL,MOTRIN) 200 MG tablet Take 600 mg by mouth every 8 (eight) hours as needed for moderate pain.     Historical Provider, MD  Multiple Vitamins-Minerals (CEROVITE SENIOR) TABS Take 1 tablet by mouth daily.     Historical Provider, MD  omeprazole (PRILOSEC) 40 MG capsule Take 1 capsule (40 mg total) by mouth daily. 01/14/14   [name], MD  verapamil (CALAN-SR) 240 MG CR tablet Take 1 tablet (240 mg total) by mouth at bedtime. 01/14/14   [name], MD   Triage Vitals: BP 150/89  Pulse 88  Temp(Src) 98.6 F (37 C) (Oral)   Resp 20  Ht 6\' 1"  (1.854 m)  Wt 240 lb (108.863 kg)  BMI 31.67 kg/m2  SpO2 100% Physical Exam  Nursing note and vitals reviewed. Constitutional: She is oriented to person, place, and time. No distress.  elderly  HENT:  Head: Normocephalic and atraumatic.  Eyes: Pupils are equal, round, and reactive to light.  Neck: Neck supple.  Cardiovascular: Normal rate, regular rhythm and normal heart sounds.   Pulmonary/Chest: Effort normal and breath sounds normal. No respiratory distress. She has no wheezes.  Holden in place  Abdominal: Soft. Bowel sounds are normal. There is no tenderness. There is no rebound.  Musculoskeletal: She exhibits no edema.  Neurological: She is alert and oriented to person, place, and time. No cranial  nerve deficit. Coordination normal.  5/5 strength in all 4 extremities, no dysmetria to finger nose finger, fluent speech  Skin: Skin is warm and dry.  Psychiatric: She has a normal mood and affect.    ED Course  Procedures (including critical care time) DIAGNOSTIC STUDIES: Oxygen Saturation is 100% on room air, normal by my interpretation.    COORDINATION OF CARE: 9:05 AM- Patient informed of current plan for treatment and evaluation and agrees with plan at this time.     Labs Review Labs Reviewed  CBC WITH DIFFERENTIAL - Abnormal; Notable for the following:    Hemoglobin 11.4 (*)    All other components within normal limits  COMPREHENSIVE METABOLIC PANEL - Abnormal; Notable for the following:    GFR calc non Af Amer 58 (*)    GFR calc Af Amer 67 (*)    All other components within normal limits  URINALYSIS, ROUTINE W REFLEX MICROSCOPIC - Abnormal; Notable for the following:    Specific Gravity, Urine >1.030 (*)    Ketones, ur TRACE (*)    Protein, ur 30 (*)    Leukocytes, UA TRACE (*)    All other components within normal limits  URINE MICROSCOPIC-ADD ON - Abnormal; Notable for the following:    Squamous Epithelial / LPF MANY (*)    All other components  within normal limits  URINE CULTURE    Imaging Review No results found.   EKG Interpretation None      MDM   Final diagnoses:  Confusion  UTI (lower urinary tract infection)   Patient presents with increasing confusion per the daughter.  Vital signs are reassuring and patient is afebrile. She is alert and oriented x3 and able to provide history. She is nonfocal on neurologic exam. Workup at this time noted for  3-6 white cells in the urine for bacteria. Specimen appears dirty. However given concerns for increasing confusion we'll send urine culture and treat for UTI. Discussed workup with patient and her daughter. At this time do not feel patient needs further imaging including CT or MRI given that she is nonfocal and oriented x3. Daughter stated understanding. We'll treat for UTI and patient to followup closely with her primary physician.   After history, exam, and medical workup I feel the patient has been appropriately medically screened and is safe for discharge home. Pertinent diagnoses were discussed with the patient. Patient was given return precautions.  I personally performed the services described in this documentation, which was scribed in my presence. The recorded information has been reviewed and is accurate.    Gabriella Hacker, MD 04/26/14 1102

## 2014-04-28 LAB — URINE CULTURE

## 2014-04-30 ENCOUNTER — Encounter: Payer: Self-pay | Admitting: Family Medicine

## 2014-04-30 ENCOUNTER — Ambulatory Visit (INDEPENDENT_AMBULATORY_CARE_PROVIDER_SITE_OTHER): Payer: Medicare PPO | Admitting: Family Medicine

## 2014-04-30 VITALS — BP 120/82 | HR 99 | Resp 16 | Wt 247.0 lb

## 2014-04-30 DIAGNOSIS — M159 Polyosteoarthritis, unspecified: Secondary | ICD-10-CM

## 2014-04-30 DIAGNOSIS — R0989 Other specified symptoms and signs involving the circulatory and respiratory systems: Secondary | ICD-10-CM

## 2014-04-30 DIAGNOSIS — I10 Essential (primary) hypertension: Secondary | ICD-10-CM

## 2014-04-30 DIAGNOSIS — E785 Hyperlipidemia, unspecified: Secondary | ICD-10-CM

## 2014-04-30 DIAGNOSIS — F3289 Other specified depressive episodes: Secondary | ICD-10-CM

## 2014-04-30 DIAGNOSIS — F05 Delirium due to known physiological condition: Secondary | ICD-10-CM

## 2014-04-30 DIAGNOSIS — F329 Major depressive disorder, single episode, unspecified: Secondary | ICD-10-CM

## 2014-04-30 DIAGNOSIS — R413 Other amnesia: Secondary | ICD-10-CM

## 2014-04-30 NOTE — Patient Instructions (Addendum)
F/u in 2.5 month You are referred for a brain scan as well as an [place] of your neck arteries and to Dr Merlene Laughter to see why you have in the past 2 weeks become confused  Urine was not infected

## 2014-05-01 DIAGNOSIS — R413 Other amnesia: Secondary | ICD-10-CM | POA: Insufficient documentation

## 2014-05-01 NOTE — Assessment & Plan Note (Signed)
High fasll risk due to limitation in mobility, pt uses walker for safety

## 2014-05-01 NOTE — Assessment & Plan Note (Signed)
Controlled, no change in medication  

## 2014-05-01 NOTE — Assessment & Plan Note (Signed)
10 day h/o acute confusion with memory loss and forgetfulness, following recent trip out of town. No new neorolgic deficits on exam or by history. Possible CVA, needs brain scan ,a lso neurology to eval Check TSH, B12 and RPR, labs added

## 2014-05-01 NOTE — Assessment & Plan Note (Signed)
MRI brain as well as additional labs and neurology eval

## 2014-05-01 NOTE — Progress Notes (Signed)
   Subjective:    Patient ID: [name], female    DOB: 1941-02-20, 73 y.o.   MRN: [id number]  HPI Pt in today with her daughter. She wasd in her normal state of health  Up to 10 days ago. She went out of state for a few days and went fishing with her family. Daughter notes that since her return, [name] has been confused and unable to recall recent events. There is no new weakness or numbness in any extremities She has no established cerebrovascular disease, but has in the past had neurologic evaluation for gait instability She was evaluated in the ED 5 days ago due to her symptoms , treated for a presumed UTI, which is now shown to  be normal, and she remains somewhat confused with no recall of the recent events surrounding her out of town trip and subsequently after this There is no recent h/o direct head trauma   Review of Systems See HPI Denies recent fever or chills. Denies sinus pressure, nasal congestion, ear pain or sore throat. Denies chest congestion, productive cough or wheezing. Denies chest pains, palpitations and leg swelling Denies abdominal pain, nausea, vomiting,diarrhea or constipation.   Denies dysuria, frequency, hesitancy or incontinence. Chronic  joint pain,  and limitation in mobility is unchanged, continues to ambulate with a walker. Denies seizures, numbness, or tingling. Denies uncontrolled  depression, anxiety or insomnia. Denies skin break down or rash.        Objective:   Physical Exam  BP 120/82  Pulse 99  Resp 16  Wt 247 lb (112.038 kg)  SpO2 97% Patient alert and oriented and in no cardiopulmonary distress.  HEENT: No facial asymmetry, EOMI,   oropharynx pink and moist.  Neck decreased though adequate ROM. Bruits bilaterally no JVD, no mass.  Chest: Clear to auscultation bilaterally.Decreased air entry bilaterally  CVS: S1, S2 no murmurs, no S3.  ABD: Soft non tender.   Ext: No edema  Gabriella: decreased ROM spine, shoulders,  hips and knees.  Skin: Intact, no ulcerations or rash noted.  Psych: Good eye contact, normal affect. Memory impaired  Mildly  anxious not  depressed appearing.  CNS: CN 2-12 intact, power,  normal throughout.no focal deficits noted.       Assessment & Plan:  HYPERTENSION Controlled, no change in medication   Carotid bruit Needs carotid doppler to further evaluate  Acute confusional state 10 day h/o acute confusion with memory loss and forgetfulness, following recent trip out of town. No new neorolgic deficits on exam or by history. Possible CVA, needs brain scan ,a lso neurology to eval Check TSH, B12 and RPR, labs added  Generalized osteoarthritis of multiple sites High fasll risk due to limitation in mobility, pt uses walker for safety  HYPERLIPIDEMIA Improved, but LDL still too high Hyperlipidemia:Low fat diet discussed and encouraged.  No med change at this time  DEPRESSION Controlled, no change in medication   Memory loss of unknown cause MRI brain as well as additional labs and neurology eval

## 2014-05-01 NOTE — Assessment & Plan Note (Signed)
Improved, but LDL still too high Hyperlipidemia:Low fat diet discussed and encouraged.  No med change at this time

## 2014-05-01 NOTE — Assessment & Plan Note (Signed)
Needs carotid doppler to further evaluate

## 2014-05-02 ENCOUNTER — Telehealth: Payer: Self-pay | Admitting: Family Medicine

## 2014-05-02 LAB — RPR

## 2014-05-02 LAB — VITAMIN B12: Vitamin B-12: 553 pg/mL (ref 211–911)

## 2014-05-02 NOTE — Telephone Encounter (Signed)
Spoke with patient and assured her that we were working closely with her daughter [name] to get her scheduled with [place] and for a brain scan. She feels better now. Is just very upset because she is so confused. Tried to comfort her as much as I could

## 2014-05-03 ENCOUNTER — Ambulatory Visit (HOSPITAL_COMMUNITY)
Admission: RE | Admit: 2014-05-03 | Discharge: 2014-05-03 | Disposition: A | Discharge status: Home / Self Care | Payer: Medicare PPO | Source: Ambulatory Visit | Attending: Family Medicine | Admitting: Family Medicine

## 2014-05-03 ENCOUNTER — Encounter (HOSPITAL_COMMUNITY): Payer: Self-pay

## 2014-05-03 DIAGNOSIS — R413 Other amnesia: Secondary | ICD-10-CM | POA: Insufficient documentation

## 2014-05-03 DIAGNOSIS — I658 Occlusion and stenosis of other precerebral arteries: Secondary | ICD-10-CM | POA: Insufficient documentation

## 2014-05-03 DIAGNOSIS — I6529 Occlusion and stenosis of unspecified carotid artery: Secondary | ICD-10-CM | POA: Insufficient documentation

## 2014-05-03 DIAGNOSIS — Z8673 Personal history of transient ischemic attack (TIA), and cerebral infarction without residual deficits: Secondary | ICD-10-CM | POA: Insufficient documentation

## 2014-05-03 DIAGNOSIS — R0989 Other specified symptoms and signs involving the circulatory and respiratory systems: Secondary | ICD-10-CM | POA: Insufficient documentation

## 2014-05-03 DIAGNOSIS — F05 Delirium due to known physiological condition: Secondary | ICD-10-CM | POA: Insufficient documentation

## 2014-05-20 ENCOUNTER — Encounter: Payer: Self-pay | Admitting: Family Medicine

## 2014-05-20 ENCOUNTER — Ambulatory Visit (INDEPENDENT_AMBULATORY_CARE_PROVIDER_SITE_OTHER): Payer: Medicare PPO | Admitting: Family Medicine

## 2014-05-20 ENCOUNTER — Encounter (INDEPENDENT_AMBULATORY_CARE_PROVIDER_SITE_OTHER): Payer: Self-pay

## 2014-05-20 VITALS — BP 128/78 | HR 100 | Resp 20 | Wt 247.0 lb

## 2014-05-20 DIAGNOSIS — R413 Other amnesia: Secondary | ICD-10-CM

## 2014-05-20 DIAGNOSIS — F05 Delirium due to known physiological condition: Secondary | ICD-10-CM

## 2014-05-20 DIAGNOSIS — M542 Cervicalgia: Secondary | ICD-10-CM

## 2014-05-20 DIAGNOSIS — N3000 Acute cystitis without hematuria: Secondary | ICD-10-CM

## 2014-05-20 DIAGNOSIS — H9202 Otalgia, left ear: Secondary | ICD-10-CM

## 2014-05-20 DIAGNOSIS — J449 Chronic obstructive pulmonary disease, unspecified: Secondary | ICD-10-CM

## 2014-05-20 DIAGNOSIS — M25569 Pain in unspecified knee: Secondary | ICD-10-CM

## 2014-05-20 DIAGNOSIS — I10 Essential (primary) hypertension: Secondary | ICD-10-CM

## 2014-05-20 DIAGNOSIS — M5090 Cervical disc disorder, unspecified, unspecified cervical region: Secondary | ICD-10-CM | POA: Insufficient documentation

## 2014-05-20 DIAGNOSIS — H9209 Otalgia, unspecified ear: Secondary | ICD-10-CM

## 2014-05-20 DIAGNOSIS — M25562 Pain in left knee: Secondary | ICD-10-CM

## 2014-05-20 LAB — POCT URINALYSIS DIPSTICK
Blood, UA: NEGATIVE
Glucose, UA: NEGATIVE
NITRITE UA: NEGATIVE
Protein, UA: 100
Spec Grav, UA: >=1.03
UROBILINOGEN UA: 0.2
pH, UA: 6

## 2014-05-20 MED ORDER — ASPIRIN EC 81 MG PO TBEC: 81.0000 mg | DELAYED_RELEASE_TABLET | Freq: Every day | ORAL | Status: DC

## 2014-05-20 NOTE — Patient Instructions (Addendum)
Annual wellness 10 /10 or after, call if you need me before   You are referred to lung specialist ,  to check your lungs  Stop ibuprofen for left knee pain, instead use tylenol 325 mg one daily and topical rub like icy hot or bengay  We will let you know about neurology referral   You need to start daily aspirin 81mg  to help to protect you from a stroke  Xray of your neck asap, you have arthritis and right neck spasm which is causing right ear pain. I recommend tylenol twice daily for the next 7 to 10 days, massages to neck  and shoulders, esp the rigth and use of warm compresses twice daily and daily neck exercises, call bac for referral to PT if desired     Urine is checked today, shows dehydration is possible, you mAY have an infection, we will know in the next approx 3 days

## 2014-05-21 LAB — BASIC METABOLIC PANEL
BUN: 9 mg/dL (ref 6–23)
CALCIUM: 9 mg/dL (ref 8.4–10.5)
CO2: 31 mEq/L (ref 19–32)
CREATININE: 0.85 mg/dL (ref 0.50–1.10)
Chloride: 100 mEq/L (ref 96–112)
Glucose, Bld: 99 mg/dL (ref 70–99)
Potassium: 3.8 mEq/L (ref 3.5–5.3)
Sodium: 143 mEq/L (ref 135–145)

## 2014-05-22 ENCOUNTER — Ambulatory Visit (HOSPITAL_COMMUNITY)
Admission: RE | Admit: 2014-05-22 | Discharge: 2014-05-22 | Disposition: A | Discharge status: Home / Self Care | Payer: Medicare PPO | Source: Ambulatory Visit | Attending: Family Medicine | Admitting: Family Medicine

## 2014-05-22 ENCOUNTER — Other Ambulatory Visit: Payer: Self-pay

## 2014-05-22 DIAGNOSIS — M542 Cervicalgia: Secondary | ICD-10-CM

## 2014-05-22 DIAGNOSIS — T148XXA Other injury of unspecified body region, initial encounter: Secondary | ICD-10-CM

## 2014-05-22 LAB — URINE CULTURE: Colony Count: >=100000

## 2014-05-23 ENCOUNTER — Ambulatory Visit (INDEPENDENT_AMBULATORY_CARE_PROVIDER_SITE_OTHER): Payer: Medicare PPO

## 2014-05-23 ENCOUNTER — Encounter (INDEPENDENT_AMBULATORY_CARE_PROVIDER_SITE_OTHER): Payer: Self-pay

## 2014-05-23 DIAGNOSIS — N3000 Acute cystitis without hematuria: Secondary | ICD-10-CM

## 2014-05-23 NOTE — Progress Notes (Signed)
Patient in for urine collection via I&O cath.  Urine sent for culture due to previous specimen being contaminated.  Will await results.

## 2014-05-26 ENCOUNTER — Other Ambulatory Visit: Payer: Self-pay | Admitting: Family Medicine

## 2014-05-26 LAB — URINE CULTURE

## 2014-05-27 ENCOUNTER — Ambulatory Visit (INDEPENDENT_AMBULATORY_CARE_PROVIDER_SITE_OTHER): Payer: Medicare PPO

## 2014-05-27 ENCOUNTER — Other Ambulatory Visit: Payer: Self-pay

## 2014-05-27 ENCOUNTER — Encounter (INDEPENDENT_AMBULATORY_CARE_PROVIDER_SITE_OTHER): Payer: Self-pay

## 2014-05-27 VITALS — BP 130/74 | Wt 248.0 lb

## 2014-05-27 DIAGNOSIS — N390 Urinary tract infection, site not specified: Secondary | ICD-10-CM

## 2014-05-27 MED ORDER — CEFTRIAXONE SODIUM 1 G IJ SOLR
500.0000 mg | Freq: Once | INTRAMUSCULAR | Status: AC
  Administered 2014-05-27: 500 mg via INTRAMUSCULAR

## 2014-05-27 MED ORDER — NITROFURANTOIN MACROCRYSTAL 100 MG PO CAPS: 100.0000 mg | ORAL_CAPSULE | Freq: Two times a day (BID) | ORAL | Status: DC

## 2014-05-27 NOTE — Progress Notes (Signed)
Patient in for injection of Rocephin 500mg  IM. Injection given as directed.  No sign or symptom of adverse reaction.  No voice complaints.  Patient to return on 7/14 for next injection.

## 2014-05-28 ENCOUNTER — Encounter (INDEPENDENT_AMBULATORY_CARE_PROVIDER_SITE_OTHER): Payer: Self-pay

## 2014-05-28 ENCOUNTER — Ambulatory Visit (INDEPENDENT_AMBULATORY_CARE_PROVIDER_SITE_OTHER): Payer: Medicare PPO

## 2014-05-28 VITALS — BP 130/74 | Wt 248.0 lb

## 2014-05-28 DIAGNOSIS — N390 Urinary tract infection, site not specified: Secondary | ICD-10-CM

## 2014-05-28 MED ORDER — CEFTRIAXONE SODIUM 1 G IJ SOLR
500.0000 mg | Freq: Once | INTRAMUSCULAR | Status: AC
  Administered 2014-05-28: 500 mg via INTRAMUSCULAR

## 2014-05-28 NOTE — Progress Notes (Signed)
Patient in for #2 of 2 Rocephin injections.   Injection given Im as directed.  No sign or symptoms of adverse reaction.  No voiced complaints.  Patient is currently also taking Macrobid for UTI.

## 2014-06-03 NOTE — Assessment & Plan Note (Signed)
Increased and uncontrolled neck pain radiaiting to ear and upper extremity with upper extremity weakness needs rept MRI to determine extent of progression of the disease

## 2014-06-03 NOTE — Progress Notes (Signed)
   Subjective:    Patient ID: [name], female    DOB: [birth year], 73 y.o.   MRN: [id number]  HPI Pt returns with daughter still concerned about new confusion, imaging studies reveal no new pathology in brain or neck to explain, will try to expedite neuro eval . C/o left ear pain, has severe arthritis of spine and neck, knee pain is severe at times and need to change to non NSAID management. Family requests lung specialist to check her breathing as seems to have increased exertional fatigue   Review of Systems See HPI Denies recent fever or chills. Denies sinus pressure, nasal congestion,  or sore throat. Denies chest congestion, productive cough or wheezing. Denies chest pains, palpitations and leg swelling Denies abdominal pain, nausea, vomiting,diarrhea or constipation.   Denies dysuria, frequency, hesitancy or incontinence.   Denies uncontrolled depression, anxiety or insomnia. Denies skin break down or rash.        Objective:   Physical Exam BP 128/78  Pulse 100  Resp 20  Wt 247 lb (112.038 kg)  SpO2 91% Patient alert and oriented and in no cardiopulmonary distress.Maintained on supplemental oxygen  HEENT: No facial asymmetry, EOMI,   oropharynx pink and moist.  Neck decreased ROM no JVD, no mass.TM clear bilaterally  Chest: Decreased though adequate  air entry, scattered wheezes, no crackles  CVS: S1, S2 no murmurs, no S3.Regular rate.  ABD: Soft non tender.   Ext: No edema  MS: Decreased ROM spine, shoulders, hips and knees.  Skin: Intact, no ulcerations or rash noted.  Psych: Good eye contact, normal affect. Memory intact not anxious or depressed appearing.  CNS: CN 2-12 intact, power,  normal throughout.no focal deficits noted.        Assessment & Plan:  Cervical neck pain with evidence of disc disease Increased and uncontrolled neck pain radiaiting to ear and upper extremity with upper extremity weakness needs rept MRI to determine  extent of progression of the disease  Memory loss of unknown cause Family reports continued confusion and memory loss of relatively acute onset, request a 2nd neurologic opinion, will refer for same. Imaging studies do not show any acute insult, and pt believes she a has been experiencing mild memory impairment over time  COPD (chronic obstructive pulmonary disease) Severe long standing COPD.Oxygen dependent Family wants pulmonary evaluation and referral made  Acute confusional state Pt still awaiting neuro eval and family anxious to have this done. Imaging studies are non revealing will f/u referral info. Clinically pt appears to  Be at baseline  Knee pain, left For renal protection , pt to use topical rubs and tylenol only, fall risk reduction also discussed  Essential hypertension Controlled, no change in medication   Otalgia of left ear Referred  From c spine disease, ear exam is normal on 06/2014, pt to have xray  And use med and possibly PT for chronic neck pain

## 2014-06-03 NOTE — Assessment & Plan Note (Signed)
Family reports continued confusion and memory loss of relatively acute onset, request a 2nd neurologic opinion, will refer for same. Imaging studies do not show any acute insult, and pt believes she a has been experiencing mild memory impairment over time

## 2014-06-06 ENCOUNTER — Encounter: Payer: Self-pay | Admitting: Neurology

## 2014-06-06 ENCOUNTER — Ambulatory Visit (INDEPENDENT_AMBULATORY_CARE_PROVIDER_SITE_OTHER): Payer: Medicare PPO | Admitting: Neurology

## 2014-06-06 VITALS — BP 126/70 | HR 101 | Ht 73.0 in | Wt 246.0 lb

## 2014-06-06 DIAGNOSIS — R269 Unspecified abnormalities of gait and mobility: Secondary | ICD-10-CM

## 2014-06-06 DIAGNOSIS — R413 Other amnesia: Secondary | ICD-10-CM

## 2014-06-06 HISTORY — DX: Unspecified abnormalities of gait and mobility: R26.9

## 2014-06-06 NOTE — Progress Notes (Signed)
Reason for visit: Memory disorder  [name] is a 73 y.o. female  History of present illness:  [name] is a 73 year old right-handed black female with a history of a chronic gait disorder, and COPD. She has obstructive sleep apnea on CPAP, but she is not using her CPAP currently. The patient is on home oxygen. She had a relatively rapid onset of problems with confusion and memory issues that began 2 months ago according to the family. The patient went to the emergency room, and she was found to have a urinary tract infection. She was treated for this, but she has not returned to her baseline. MRI of the brain was done showing very minimal small vessel disease, but chronic medullary infarcts, left greater than right, were noted without acute changes seen. The patient herself reports no new numbness or weakness of the face, arms, or legs. He denies any headaches. She does have a chronic gait disorder and a history of vertigo that dates back for 5 years. She walks with a walker, and she does fall on occasion. She reports no recent episode of head trauma with a fall. She has been repeating herself, and she is no longer driving or cooking over the last 2 months. The patient has poor short-term memory. She does have a chronic tremor affecting the head and neck and arms. She denies issues controlling the bowels or the bladder. She has not had any significant lethargy during the day. The patient has undergone blood work that includes a vitamin B12 level, RPR, and thyroid profile that were unremarkable. She is sent to this office for an evaluation.  Past Medical History  Diagnosis Date  . Allergic rhinitis   . Anemia   . Anxiety   . Depression   . GERD (gastroesophageal reflux disease)   . Hypertension   . Low back pain   . Arthritis     abnormal gait   . OSA on CPAP   . Degenerative disc disease, lumbar     L5 nerve impingement   . Degenerative disc disease, cervical     syrinx  C3-7  . Arm fracture, left   . Onychomycosis   . Ankle fracture, right   . Hyperglycemia   . Mediastinal lymphadenopathy 1/9    resolving   . COPD (chronic obstructive pulmonary disease)     chronic CO2 retention, decreased DLCO   . Cystic acne     adult   . Sleep apnea     stop bang score 6  . History of colon surgery     right hemicolectomy for high-grade adenomas in right colon 2013  . Abnormality of gait 06/06/2014    Past Surgical History  Procedure Laterality Date  . Abdominal hysterectomy  1976    secondary to bleeding   . Oophorectomy  1976  . Epidural steroids    . Orif ankle fracture  01/18/2012    Procedure: OPEN REDUCTION INTERNAL FIXATION (ORIF) ANKLE FRACTURE;  Surgeon: Arther Abbott, MD;  Location: [facility];  Service: Orthopedics;  Laterality: Left;  . Colonoscopy  07/20/2012    Dr. Gala Romney: multiple colonic polyps with large polyps on right side, s/p saline-assisted debulking piecemeal polypectomy and ablation. Not all removed. Path with tubulovillous adenomas, high grade dysplasia.   [name] Appendectomy    . Colon resection  08/18/2012    Procedure: HAND ASSISTED LAPAROSCOPIC COLON RESECTION;  Surgeon: Jamesetta So, MD;  Location: [facility];  Service: General;  Laterality: N/A;  .  Colonoscopy N/A 03/09/2013    QMV:HQIONGE polyps-tubular adenomas. S/p right hemicolectomy. next tcs 02/2018  . Esophagogastroduodenoscopy (egd) with esophageal dilation N/A 02/06/2014    Procedure: ESOPHAGOGASTRODUODENOSCOPY (EGD) WITH ESOPHAGEAL DILATION;  Surgeon: Daneil Dolin, MD;  Location: [facility];  Service: Endoscopy;  Laterality: N/A;  9:30    Family History  Problem Relation Age of Onset  . Stomach cancer Father   . Breast cancer Father   . Cancer Father   . Cancer Sister   . Hypertension Son   . Cancer Mother   . Arthritis      Social history:  reports that she quit smoking about 7 years ago. Her smoking use included Cigarettes. She has a 60 pack-year smoking history. She  has never used smokeless tobacco. She reports that she does not drink alcohol or use illicit drugs.  Medications:  Current Outpatient Prescriptions on File Prior to Visit  Medication Sig Dispense Refill  . acetaminophen (TYLENOL) 325 MG tablet One tablet twice daily, as needed, for uncontrolled knee pain      . alendronate (FOSAMAX) 70 MG tablet Take 1 tablet (70 mg total) by mouth once a week. Take with a full glass of water on an empty stomach.  4 tablet  11  . arformoterol (BROVANA) 15 MCG/2ML NEBU Take 2 mLs (15 mcg total) by nebulization 2 (two) times daily.  360 mL  1  . aspirin EC 81 MG tablet Take 1 tablet (81 mg total) by mouth daily.  100 tablet  3  . atorvastatin (LIPITOR) 10 MG tablet Take 1 tablet (10 mg total) by mouth daily.  90 tablet  1  . calcium-vitamin D (OSCAL WITH D) 500-200 MG-UNIT per tablet Take 1 tablet by mouth daily.       [name] FLUoxetine (PROZAC) 40 MG capsule Take 1 capsule (40 mg total) by mouth daily.  90 capsule  1  . fluticasone (FLONASE) 50 MCG/ACT nasal spray Place 1 spray into the nose daily.  16 g  2  . Multiple Vitamins-Minerals (CEROVITE SENIOR) TABS Take 1 tablet by mouth daily.       [name] omeprazole (PRILOSEC) 40 MG capsule Take 1 capsule (40 mg total) by mouth daily.  90 capsule  1  . verapamil (CALAN-SR) 240 MG CR tablet Take 1 tablet (240 mg total) by mouth at bedtime.  90 tablet  1   No current facility-administered medications on file prior to visit.      Allergies  Allergen Reactions  . Fenofibrate Nausea And Vomiting  . Pravastatin Sodium Nausea And Vomiting  . Sulfonamide Derivatives Other (See Comments)    unknown  . Ciprofloxacin Rash    ROS:  Out of a complete 14 system review of symptoms, the patient complains only of the following symptoms, and all other reviewed systems are negative.  Ear pain Gait disturbance Memory loss Confusion, anxiety Snoring  Blood pressure 126/70, pulse 101, height 6\' 1"  (1.854 m), weight 246 lb  (111.585 kg).  Physical Exam  General: The patient is alert and cooperative at the time of the examination.  Eyes: Pupils are equal, round, and reactive to light. Discs are flat bilaterally.  Neck: The neck is supple, no carotid bruits are noted.  Respiratory: The respiratory examination is clear.  Cardiovascular: The cardiovascular examination reveals a regular rate and rhythm, no obvious murmurs or rubs are noted.  Skin: Extremities are without significant edema.  Neurologic Exam  Mental status: The Mini-Mental status examination done today shows a  total score 25/30.  Cranial nerves: Facial symmetry is present. There is good sensation of the face to pinprick and soft touch bilaterally. The strength of the facial muscles and the muscles to head turning and shoulder shrug are normal bilaterally. Speech is well enunciated, no aphasia or dysarthria is noted. Extraocular movements are full. Visual fields are full. The tongue is midline, and the patient has symmetric elevation of the soft palate. No obvious hearing deficits are noted.  Motor: The motor testing reveals 5 over 5 strength of all 4 extremities. Good symmetric motor tone is noted throughout.  Sensory: Sensory testing is intact to pinprick, soft touch, vibration sensation, and position sense on all 4 extremities, with exception of a stocking pattern pinprick sensory deficit to the ankles bilaterally, decreased vibration sensation in both feet. No evidence of extinction is noted.  Coordination: Cerebellar testing reveals good finger-nose-finger and heel-to-shin bilaterally.  Gait and station: Gait is wide-based, unsteady. The patient uses a walker for ambulation. Attempted. Romberg is negative. No drift is seen.  Reflexes: Deep tendon reflexes are symmetric and normal bilaterally. Toes are downgoing bilaterally.   MRI brain 05/03/14:  IMPRESSION:  No acute infarct or mass  Small chronic infarcts in the posterior medulla left  greater than  right.    Assessment/Plan:  1. Memory disorder, confusion, subacute onset  2. Chronic gait disorder  3. 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 The patient does have a chronic respiratory illness on oxygen. She had sleep apnea, and she is not using her CPAP over the last one year. The etiology of the confusion and memory problems is not clear. The patient will undergo further blood work today, and she will have an EEG study. The patient will have a blood gas study if the above evaluation is unremarkable. She will followup through this office in 2-3 months. A lumbar puncture may be considered in the future.  Jill Alexanders MD 06/06/2014 8:02 PM  [facility] [address] [place], [place] [address]  Phone [phone] Fax [phone]

## 2014-06-06 NOTE — Patient Instructions (Signed)

## 2014-06-09 LAB — SEDIMENTATION RATE: SED RATE: 37 mm/h (ref 0–40)

## 2014-06-09 LAB — AMMONIA: AMMONIA: 99 ug/dL — AB (ref 19–87)

## 2014-06-09 LAB — VITAMIN B1, WHOLE BLOOD: THIAMINE: 155.3 nmol/L (ref 66.5–200.0)

## 2014-06-09 LAB — COPPER, SERUM: Copper: 131 ug/dL (ref 72–166)

## 2014-06-10 ENCOUNTER — Telehealth: Payer: Self-pay | Admitting: *Deleted

## 2014-06-10 ENCOUNTER — Ambulatory Visit (HOSPITAL_COMMUNITY)
Admission: RE | Admit: 2014-06-10 | Discharge: 2014-06-10 | Disposition: A | Discharge status: Home / Self Care | Payer: Medicare PPO | Source: Ambulatory Visit | Attending: Neurology | Admitting: Neurology

## 2014-06-10 ENCOUNTER — Telehealth: Payer: Self-pay | Admitting: Neurology

## 2014-06-10 ENCOUNTER — Other Ambulatory Visit: Payer: Self-pay | Admitting: Neurology

## 2014-06-10 ENCOUNTER — Ambulatory Visit (HOSPITAL_COMMUNITY)
Admission: RE | Admit: 2014-06-10 | Discharge: 2014-06-10 | Disposition: A | Discharge status: Home / Self Care | Payer: Medicare PPO | Source: Ambulatory Visit | Attending: Family Medicine | Admitting: Family Medicine

## 2014-06-10 DIAGNOSIS — Z0389 Encounter for observation for other suspected diseases and conditions ruled out: Secondary | ICD-10-CM | POA: Insufficient documentation

## 2014-06-10 DIAGNOSIS — Z1231 Encounter for screening mammogram for malignant neoplasm of breast: Secondary | ICD-10-CM | POA: Insufficient documentation

## 2014-06-10 DIAGNOSIS — J441 Chronic obstructive pulmonary disease with (acute) exacerbation: Secondary | ICD-10-CM

## 2014-06-10 DIAGNOSIS — G9340 Encephalopathy, unspecified: Secondary | ICD-10-CM

## 2014-06-10 DIAGNOSIS — Z1239 Encounter for other screening for malignant neoplasm of breast: Secondary | ICD-10-CM

## 2014-06-10 LAB — BLOOD GAS, ARTERIAL
Acid-Base Excess: 4.7 mmol/L — ABNORMAL HIGH (ref 0.0–2.0)
Bicarbonate: 29.9 mEq/L — ABNORMAL HIGH (ref 20.0–24.0)
Drawn by: 234301
O2 Content: 2 L/min
O2 SAT: 90.5 %
PATIENT TEMPERATURE: 37
PH ART: 7.353 (ref 7.350–7.450)
TCO2: 27.4 mmol/L (ref 0–100)
pCO2 arterial: 55.3 mmHg — ABNORMAL HIGH (ref 35.0–45.0)
pO2, Arterial: 64.7 mmHg — ABNORMAL LOW (ref 80.0–100.0)

## 2014-06-10 NOTE — Telephone Encounter (Signed)
The blood work shows a slightly high ammonia level. Unclear if this is the sole etiology of her confusion. I will go on to check a blood gas given her chronic pulmonary disease. I called the family. We will go on to get the blood gas at [facility].

## 2014-06-10 NOTE — Telephone Encounter (Signed)
I called the family. Blood gas shows a low oxygen level and a high CO2 level, but the results are in a range that is at her baseline according to the prior study done. I am not sure this would explain a subacute onset of confusion, but could be a contributing factor. EEG is pending, if this does not show anything that explains her confusion, we will pursue a lumbar puncture.

## 2014-06-10 NOTE — Telephone Encounter (Signed)
I spoke with [place] at [facility].  She asked for confirmation on the lab test ordered.  I relayed to her that order for arterial blood gas was correct per Dr. Tobey Grim note.

## 2014-06-11 ENCOUNTER — Other Ambulatory Visit: Payer: Medicare PPO | Admitting: Radiology

## 2014-06-11 ENCOUNTER — Ambulatory Visit (HOSPITAL_COMMUNITY)
Admission: RE | Admit: 2014-06-11 | Discharge: 2014-06-11 | Disposition: A | Discharge status: Home / Self Care | Payer: Medicare PPO | Source: Ambulatory Visit | Attending: Family Medicine | Admitting: Family Medicine

## 2014-06-11 DIAGNOSIS — Z13820 Encounter for screening for osteoporosis: Secondary | ICD-10-CM | POA: Diagnosis present

## 2014-06-12 ENCOUNTER — Other Ambulatory Visit: Payer: Self-pay

## 2014-06-12 DIAGNOSIS — F32A Depression, unspecified: Secondary | ICD-10-CM

## 2014-06-12 DIAGNOSIS — I10 Essential (primary) hypertension: Secondary | ICD-10-CM

## 2014-06-12 DIAGNOSIS — E785 Hyperlipidemia, unspecified: Secondary | ICD-10-CM

## 2014-06-12 DIAGNOSIS — F329 Major depressive disorder, single episode, unspecified: Secondary | ICD-10-CM

## 2014-06-12 MED ORDER — VERAPAMIL HCL ER 240 MG PO TBCR: 240.0000 mg | EXTENDED_RELEASE_TABLET | Freq: Every day | ORAL | Status: DC

## 2014-06-12 MED ORDER — OMEPRAZOLE 40 MG PO CPDR: 40.0000 mg | DELAYED_RELEASE_CAPSULE | Freq: Every day | ORAL | Status: DC

## 2014-06-12 MED ORDER — FLUOXETINE HCL 40 MG PO CAPS
40.0000 mg | ORAL_CAPSULE | Freq: Every day | ORAL | Status: DC
Start: 2014-06-12 — End: 2014-06-17

## 2014-06-12 MED ORDER — ATORVASTATIN CALCIUM 10 MG PO TABS: 10.0000 mg | ORAL_TABLET | Freq: Every day | ORAL | Status: DC

## 2014-06-17 ENCOUNTER — Other Ambulatory Visit: Payer: Self-pay

## 2014-06-17 DIAGNOSIS — F329 Major depressive disorder, single episode, unspecified: Secondary | ICD-10-CM

## 2014-06-17 DIAGNOSIS — F32A Depression, unspecified: Secondary | ICD-10-CM

## 2014-06-17 DIAGNOSIS — I10 Essential (primary) hypertension: Secondary | ICD-10-CM

## 2014-06-17 MED ORDER — OMEPRAZOLE 40 MG PO CPDR: 40.0000 mg | DELAYED_RELEASE_CAPSULE | Freq: Every day | ORAL | Status: DC

## 2014-06-17 MED ORDER — VERAPAMIL HCL ER 240 MG PO TBCR: 240.0000 mg | EXTENDED_RELEASE_TABLET | Freq: Every day | ORAL | Status: DC

## 2014-06-17 MED ORDER — FLUOXETINE HCL 40 MG PO CAPS: 40.0000 mg | ORAL_CAPSULE | Freq: Every day | ORAL | Status: DC

## 2014-06-19 ENCOUNTER — Ambulatory Visit (INDEPENDENT_AMBULATORY_CARE_PROVIDER_SITE_OTHER): Payer: Medicare PPO

## 2014-06-19 DIAGNOSIS — R269 Unspecified abnormalities of gait and mobility: Secondary | ICD-10-CM

## 2014-06-19 DIAGNOSIS — R413 Other amnesia: Secondary | ICD-10-CM

## 2014-06-20 ENCOUNTER — Encounter: Payer: Self-pay | Admitting: Family Medicine

## 2014-06-20 ENCOUNTER — Encounter (INDEPENDENT_AMBULATORY_CARE_PROVIDER_SITE_OTHER): Payer: Self-pay

## 2014-06-20 ENCOUNTER — Ambulatory Visit (INDEPENDENT_AMBULATORY_CARE_PROVIDER_SITE_OTHER): Payer: Medicare PPO | Admitting: Family Medicine

## 2014-06-20 VITALS — BP 114/72 | HR 78 | Resp 16 | Ht 73.0 in | Wt 249.1 lb

## 2014-06-20 DIAGNOSIS — F05 Delirium due to known physiological condition: Secondary | ICD-10-CM

## 2014-06-20 DIAGNOSIS — E785 Hyperlipidemia, unspecified: Secondary | ICD-10-CM

## 2014-06-20 DIAGNOSIS — F3289 Other specified depressive episodes: Secondary | ICD-10-CM

## 2014-06-20 DIAGNOSIS — Z23 Encounter for immunization: Secondary | ICD-10-CM

## 2014-06-20 DIAGNOSIS — F32A Depression, unspecified: Secondary | ICD-10-CM

## 2014-06-20 DIAGNOSIS — I10 Essential (primary) hypertension: Secondary | ICD-10-CM

## 2014-06-20 DIAGNOSIS — M5090 Cervical disc disorder, unspecified, unspecified cervical region: Secondary | ICD-10-CM

## 2014-06-20 DIAGNOSIS — J449 Chronic obstructive pulmonary disease, unspecified: Secondary | ICD-10-CM

## 2014-06-20 DIAGNOSIS — F329 Major depressive disorder, single episode, unspecified: Secondary | ICD-10-CM

## 2014-06-20 DIAGNOSIS — M4800 Spinal stenosis, site unspecified: Secondary | ICD-10-CM

## 2014-06-20 MED ORDER — ATORVASTATIN CALCIUM 10 MG PO TABS: 10.0000 mg | ORAL_TABLET | Freq: Every day | ORAL | Status: DC

## 2014-06-20 MED ORDER — VERAPAMIL HCL ER 240 MG PO TBCR: 240.0000 mg | EXTENDED_RELEASE_TABLET | Freq: Every day | ORAL | Status: DC

## 2014-06-20 MED ORDER — FLUOXETINE HCL 40 MG PO CAPS
40.0000 mg | ORAL_CAPSULE | Freq: Every day | ORAL | Status: DC
Start: 2014-06-20 — End: 2015-03-17

## 2014-06-20 MED ORDER — OMEPRAZOLE 40 MG PO CPDR: 40.0000 mg | DELAYED_RELEASE_CAPSULE | Freq: Every day | ORAL | Status: DC

## 2014-06-20 NOTE — Procedures (Signed)
   HISTORY:  73 years old female, with past medical history of COPD, obstructive sleep apnea, presenting with rapid onset of confusion, memory issue.  TECHNIQUE:  16 channel EEG was performed based on standard 10-16 international system. One channel was dedicated to EKG, which has demonstrates normal sinus rhythm of  84beats per minutes.  Upon awakening, the posterior background activity was well-developed, in alpha range 9-10 Hz, with amplitude of  30 microvoltage, reactive to eye opening and closure.  There was no evidence of epileptiform discharge.  Photic stimulation was performed, which induced a symmetric photic driving.  Hyperventilation was  Not performed, because patient's history of COPD  During recording, the patient drifted into drowsiness, as evident by attenuation of background activity, but no deeper stage of sleep was achieved,  CONCLUSION: This is a  normal  awake EEG.  There is no electrodiagnostic evidence of epileptiform discharge

## 2014-06-20 NOTE — Patient Instructions (Signed)
Annual wellness early November , call if you need me before  Prevnar today  Careful not to fall  Keep your brain active so that you remember me!    Thankful that you are doing better

## 2014-06-22 ENCOUNTER — Emergency Department (HOSPITAL_COMMUNITY)
Admission: EM | Admit: 2014-06-22 | Discharge: 2014-06-23 | Disposition: A | Discharge status: Home / Self Care | Payer: Medicare PPO | Attending: Emergency Medicine | Admitting: Emergency Medicine

## 2014-06-22 ENCOUNTER — Encounter (HOSPITAL_COMMUNITY): Payer: Self-pay | Admitting: Emergency Medicine

## 2014-06-22 ENCOUNTER — Emergency Department (HOSPITAL_COMMUNITY): Payer: Medicare PPO

## 2014-06-22 DIAGNOSIS — Z9981 Dependence on supplemental oxygen: Secondary | ICD-10-CM | POA: Diagnosis not present

## 2014-06-22 DIAGNOSIS — Z7982 Long term (current) use of aspirin: Secondary | ICD-10-CM | POA: Diagnosis not present

## 2014-06-22 DIAGNOSIS — Z862 Personal history of diseases of the blood and blood-forming organs and certain disorders involving the immune mechanism: Secondary | ICD-10-CM | POA: Insufficient documentation

## 2014-06-22 DIAGNOSIS — G4733 Obstructive sleep apnea (adult) (pediatric): Secondary | ICD-10-CM | POA: Insufficient documentation

## 2014-06-22 DIAGNOSIS — I10 Essential (primary) hypertension: Secondary | ICD-10-CM | POA: Diagnosis not present

## 2014-06-22 DIAGNOSIS — Z9889 Other specified postprocedural states: Secondary | ICD-10-CM | POA: Diagnosis not present

## 2014-06-22 DIAGNOSIS — J449 Chronic obstructive pulmonary disease, unspecified: Secondary | ICD-10-CM | POA: Diagnosis not present

## 2014-06-22 DIAGNOSIS — J4489 Other specified chronic obstructive pulmonary disease: Secondary | ICD-10-CM | POA: Insufficient documentation

## 2014-06-22 DIAGNOSIS — Z8619 Personal history of other infectious and parasitic diseases: Secondary | ICD-10-CM | POA: Insufficient documentation

## 2014-06-22 DIAGNOSIS — Z87891 Personal history of nicotine dependence: Secondary | ICD-10-CM | POA: Diagnosis not present

## 2014-06-22 DIAGNOSIS — R404 Transient alteration of awareness: Secondary | ICD-10-CM | POA: Insufficient documentation

## 2014-06-22 DIAGNOSIS — Z79899 Other long term (current) drug therapy: Secondary | ICD-10-CM | POA: Insufficient documentation

## 2014-06-22 DIAGNOSIS — Z23 Encounter for immunization: Secondary | ICD-10-CM | POA: Insufficient documentation

## 2014-06-22 DIAGNOSIS — R413 Other amnesia: Secondary | ICD-10-CM

## 2014-06-22 DIAGNOSIS — W19XXXA Unspecified fall, initial encounter: Secondary | ICD-10-CM

## 2014-06-22 LAB — COMPREHENSIVE METABOLIC PANEL
ALBUMIN: 3.8 g/dL (ref 3.5–5.2)
ALT: 19 U/L (ref 0–35)
ANION GAP: 12 (ref 5–15)
AST: 21 U/L (ref 0–37)
Alkaline Phosphatase: 97 U/L (ref 39–117)
BILIRUBIN TOTAL: 0.2 mg/dL — AB (ref 0.3–1.2)
BUN: 20 mg/dL (ref 6–23)
CO2: 28 mEq/L (ref 19–32)
Calcium: 9 mg/dL (ref 8.4–10.5)
Chloride: 102 mEq/L (ref 96–112)
Creatinine, Ser: 1.01 mg/dL (ref 0.50–1.10)
GFR calc Af Amer: 63 mL/min — ABNORMAL LOW (ref >90)
GFR calc non Af Amer: 54 mL/min — ABNORMAL LOW (ref >90)
Glucose, Bld: 99 mg/dL (ref 70–99)
Potassium: 3.7 mEq/L (ref 3.7–5.3)
Sodium: 142 mEq/L (ref 137–147)
Total Protein: 7.4 g/dL (ref 6.0–8.3)

## 2014-06-22 LAB — CBC WITH DIFFERENTIAL/PLATELET
BASOS PCT: 0 % (ref 0–1)
Basophils Absolute: 0 10*3/uL (ref 0.0–0.1)
EOS PCT: 3 % (ref 0–5)
Eosinophils Absolute: 0.2 10*3/uL (ref 0.0–0.7)
HCT: 36.7 % (ref 36.0–46.0)
Hemoglobin: 11.1 g/dL — ABNORMAL LOW (ref 12.0–15.0)
Lymphocytes Relative: 22 % (ref 12–46)
Lymphs Abs: 1.6 10*3/uL (ref 0.7–4.0)
MCH: 26.1 pg (ref 26.0–34.0)
MCHC: 30.2 g/dL (ref 30.0–36.0)
MCV: 86.2 fL (ref 78.0–100.0)
MONOS PCT: 6 % (ref 3–12)
Monocytes Absolute: 0.4 10*3/uL (ref 0.1–1.0)
Neutro Abs: 5 10*3/uL (ref 1.7–7.7)
Neutrophils Relative %: 69 % (ref 43–77)
Platelets: 227 10*3/uL (ref 150–400)
RBC: 4.26 MIL/uL (ref 3.87–5.11)
RDW: 15.2 % (ref 11.5–15.5)
WBC: 7.2 10*3/uL (ref 4.0–10.5)

## 2014-06-22 LAB — URINALYSIS, ROUTINE W REFLEX MICROSCOPIC
Bilirubin Urine: NEGATIVE
GLUCOSE, UA: NEGATIVE mg/dL
Hgb urine dipstick: NEGATIVE
KETONES UR: NEGATIVE mg/dL
Nitrite: NEGATIVE
PH: 6 (ref 5.0–8.0)
Protein, ur: NEGATIVE mg/dL
Urobilinogen, UA: 0.2 mg/dL (ref 0.0–1.0)

## 2014-06-22 LAB — URINE MICROSCOPIC-ADD ON

## 2014-06-22 LAB — TROPONIN I: Troponin I: <0.3 ng/mL (ref <0.30)

## 2014-06-22 MED ORDER — SODIUM CHLORIDE 0.9 % IV SOLN: INTRAVENOUS | Status: DC

## 2014-06-22 NOTE — Assessment & Plan Note (Signed)
No acute flare, has upcoming appt with pulmonary, to establish that current meds are most appropriate

## 2014-06-22 NOTE — ED Provider Notes (Signed)
CSN: [id number]     Arrival date & time 06/22/14  2153 History   First MD Initiated Contact with Patient 06/22/14 2203   This chart was scribed for Fredia Sorrow, MD by Rosary Lively, ED scribe. This patient was seen in room [facility] and the patient's care was started at 10:16 PM.    Chief Complaint  Patient presents with  . Loss of Consciousness  . Fall   Patient is a 73 y.o. female presenting with syncope and fall. The history is provided by the patient. No language interpreter was used.  Loss of Consciousness Episode history:  Single Most recent episode:  Today Chronicity:  New Witnessed: no   Associated symptoms: no chest pain, no confusion, no dizziness, no fever, no headaches, no nausea, no shortness of breath and no vomiting   Fall Pertinent negatives include no chest pain, no abdominal pain, no headaches and no shortness of breath.   HPI Comments:  [name] is a 73 y.o. female who presents to the Emergency Department complaining of a fall. Pt reports that she experienced LOC and states that she called for EMS when she awoke. Pt states that she is unsure of why or how she fell. Pt states that she does use 2 liters of O2 a day. Relatives report that pt has fallen before. Pt denies any current CP, or pain in the hips. Pt reports that PCP is Dr. Moshe Cipro.   Past Medical History  Diagnosis Date  . Allergic rhinitis   . Anemia   . Anxiety   . Depression   . GERD (gastroesophageal reflux disease)   . Hypertension   . Low back pain   . Arthritis     abnormal gait   . OSA on CPAP   . Degenerative disc disease, lumbar     L5 nerve impingement   . 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 Arm fracture, left   . Onychomycosis   . Ankle fracture, right   . Hyperglycemia   . Mediastinal lymphadenopathy 1/9    resolving   . COPD (chronic obstructive pulmonary disease)     chronic CO2 retention, decreased DLCO   . Cystic acne     adult   . Sleep apnea      stop bang score 6  . History of colon surgery     right hemicolectomy for high-grade adenomas in right colon 2013  . Abnormality of gait 06/06/2014   Past Surgical History  Procedure Laterality Date  . Abdominal hysterectomy  1976    secondary to bleeding   . Oophorectomy  1976  . Epidural steroids    . Orif ankle fracture  01/18/2012    Procedure: OPEN REDUCTION INTERNAL FIXATION (ORIF) ANKLE FRACTURE;  Surgeon: Arther Abbott, MD;  Location: [facility];  Service: Orthopedics;  Laterality: Left;  . Colonoscopy  07/20/2012    Dr. Gala Romney: multiple colonic polyps with large polyps on right side, s/p saline-assisted debulking piecemeal polypectomy and ablation. Not all removed. Path with tubulovillous adenomas, high grade dysplasia.   [name] Appendectomy    . Colon resection  08/18/2012    Procedure: HAND ASSISTED LAPAROSCOPIC COLON RESECTION;  Surgeon: Jamesetta So, MD;  Location: [facility];  Service: General;  Laterality: N/A;  . Colonoscopy N/A 03/09/2013    TDV:VOHYWVP polyps-tubular adenomas. S/p right hemicolectomy. next tcs 02/2018  . Esophagogastroduodenoscopy (egd) with esophageal dilation N/A 02/06/2014    Procedure: ESOPHAGOGASTRODUODENOSCOPY (EGD) WITH ESOPHAGEAL DILATION;  Surgeon:  Daneil Dolin, MD;  Location: [facility];  Service: Endoscopy;  Laterality: N/A;  9:30   Family History  Problem Relation Age of Onset  . Stomach cancer Father   . Breast cancer Father   . Cancer Father   . Cancer Sister   . Hypertension Son   . Cancer Mother   . Arthritis     History  Substance Use Topics  . Smoking status: Former Smoker -- 1.50 packs/day for 40 years    Types: Cigarettes    Quit date: 06/21/2006  . Smokeless tobacco: Never Used  . Alcohol Use: No   OB History   Grav Para Term Preterm Abortions TAB SAB Ect Mult Living   5 4 4  1  1         Review of Systems  Constitutional: Negative for fever and chills.  HENT: Negative for rhinorrhea and sore throat.   Eyes: Negative for  visual disturbance.  Respiratory: Negative for cough and shortness of breath.   Cardiovascular: Positive for syncope. Negative for chest pain and leg swelling.  Gastrointestinal: Negative for nausea, vomiting, abdominal pain and diarrhea.  Genitourinary: Negative for dysuria.  Musculoskeletal: Negative for back pain, joint swelling and neck pain.  Skin: Negative for rash.  Neurological: Negative for dizziness, light-headedness and headaches.  Hematological: Does not bruise/bleed easily.  Psychiatric/Behavioral: Negative for confusion.      Allergies  Fenofibrate; Pravastatin sodium; Sulfonamide derivatives; and Ciprofloxacin  Home Medications   Prior to Admission medications   Medication Sig Start Date End Date Taking? Authorizing Provider  acetaminophen (TYLENOL) 325 MG tablet Take 325 mg by mouth 2 (two) times daily.  05/20/14  Yes [name], MD  alendronate (FOSAMAX) 70 MG tablet Take 1 tablet (70 mg total) by mouth once a week. Take with a full glass of water on an empty stomach. 04/18/14  Yes [name], MD  arformoterol (BROVANA) 15 MCG/2ML NEBU Take 2 mLs (15 mcg total) by nebulization 2 (two) times daily. 04/25/12  Yes [name], MD  aspirin EC 81 MG tablet Take 1 tablet (81 mg total) by mouth daily. 05/20/14  Yes [name], MD  atorvastatin (LIPITOR) 10 MG tablet Take 1 tablet (10 mg total) by mouth daily. 06/20/14 06/20/15 Yes [name], MD  calcium-vitamin D (OSCAL WITH D) 500-200 MG-UNIT per tablet Take 1 tablet by mouth daily.    Yes Historical Provider, MD  FLUoxetine (PROZAC) 40 MG capsule Take 1 capsule (40 mg total) by mouth daily. 06/20/14  Yes [name], MD  fluticasone (FLONASE) 50 MCG/ACT nasal spray Place 1 spray into the nose daily. 06/14/13 06/22/14 Yes [name], MD  Multiple Vitamins-Minerals (CEROVITE SENIOR) TABS Take 1 tablet by mouth daily.    Yes Historical Provider, MD  omeprazole (PRILOSEC) 40 MG capsule Take  1 capsule (40 mg total) by mouth daily. 06/20/14  Yes [name], MD  verapamil (CALAN-SR) 240 MG CR tablet Take 1 tablet (240 mg total) by mouth at bedtime. 06/20/14  Yes [name], MD   BP 111/85  Pulse 95  Temp(Src) 98.1 F (36.7 C) (Oral)  Resp 22  SpO2 92% Physical Exam  Nursing note and vitals reviewed. Constitutional: She is oriented to person, place, and time. She appears well-developed and well-nourished.  HENT:  Head: Normocephalic and atraumatic.  Mouth/Throat: Mucous membranes are normal.  Eyes: EOM are normal.  Neck: Normal range of motion. Neck supple.  Cardiovascular: Normal rate,  regular rhythm and normal heart sounds.   No murmur heard. Pulmonary/Chest: Effort normal and breath sounds normal.  Abdominal: Soft. Bowel sounds are normal. There is no tenderness.  Musculoskeletal: Normal range of motion. She exhibits no edema.  Neurological: She is alert and oriented to person, place, and time. No cranial nerve deficit. She exhibits normal muscle tone. Coordination normal.  Skin: Skin is warm and dry.  Psychiatric: She has a normal mood and affect. Her behavior is normal.    ED Course  Procedures  DIAGNOSTIC STUDIES: Oxygen Saturation is 89% on RA, low by my interpretation.  COORDINATION OF CARE: 10:23 PM-Discussed treatment plan with pt at bedside and pt agreed to plan.  Labs Review Labs Reviewed  CBC WITH DIFFERENTIAL - Abnormal; Notable for the following:    Hemoglobin 11.1 (*)    All other components within normal limits  COMPREHENSIVE METABOLIC PANEL - Abnormal; Notable for the following:    Total Bilirubin 0.2 (*)    GFR calc non Af Amer 54 (*)    GFR calc Af Amer 63 (*)    All other components within normal limits  URINALYSIS, ROUTINE W REFLEX MICROSCOPIC - Abnormal; Notable for the following:    Specific Gravity, Urine <1.005 (*)    Leukocytes, UA SMALL (*)    All other components within normal limits  URINE MICROSCOPIC-ADD ON -  Abnormal; Notable for the following:    Bacteria, UA FEW (*)    All other components within normal limits  TROPONIN I   Results for orders placed during the hospital encounter of 06/22/14  CBC WITH DIFFERENTIAL      Result Value Ref Range   WBC 7.2  4.0 - 10.5 K/uL   RBC 4.26  3.87 - 5.11 MIL/uL   Hemoglobin 11.1 (*) 12.0 - 15.0 g/dL   HCT 36.7  36.0 - 46.0 %   MCV 86.2  78.0 - 100.0 fL   MCH 26.1  26.0 - 34.0 pg   MCHC 30.2  30.0 - 36.0 g/dL   RDW 15.2  11.5 - 15.5 %   Platelets 227  150 - 400 K/uL   Neutrophils Relative % 69  43 - 77 %   Neutro Abs 5.0  1.7 - 7.7 K/uL   Lymphocytes Relative 22  12 - 46 %   Lymphs Abs 1.6  0.7 - 4.0 K/uL   Monocytes Relative 6  3 - 12 %   Monocytes Absolute 0.4  0.1 - 1.0 K/uL   Eosinophils Relative 3  0 - 5 %   Eosinophils Absolute 0.2  0.0 - 0.7 K/uL   Basophils Relative 0  0 - 1 %   Basophils Absolute 0.0  0.0 - 0.1 K/uL  COMPREHENSIVE METABOLIC PANEL      Result Value Ref Range   Sodium 142  137 - 147 mEq/L   Potassium 3.7  3.7 - 5.3 mEq/L   Chloride 102  96 - 112 mEq/L   CO2 28  19 - 32 mEq/L   Glucose, Bld 99  70 - 99 mg/dL   BUN 20  6 - 23 mg/dL   Creatinine, Ser 1.01  0.50 - 1.10 mg/dL   Calcium 9.0  8.4 - 10.5 mg/dL   Total Protein 7.4  6.0 - 8.3 g/dL   Albumin 3.8  3.5 - 5.2 g/dL   AST 21  0 - 37 U/L   ALT 19  0 - 35 U/L   Alkaline Phosphatase 97  39 - 117  U/L   Total Bilirubin 0.2 (*) 0.3 - 1.2 mg/dL   GFR calc non Af Amer 54 (*) >90 mL/min   GFR calc Af Amer 63 (*) >90 mL/min   Anion gap 12  5 - 15  TROPONIN I      Result Value Ref Range   Troponin I <0.30  <0.30 ng/mL  URINALYSIS, ROUTINE W REFLEX MICROSCOPIC      Result Value Ref Range   Color, Urine YELLOW  YELLOW   APPearance CLEAR  CLEAR   Specific Gravity, Urine <1.005 (*) 1.005 - 1.030   pH 6.0  5.0 - 8.0   Glucose, UA NEGATIVE  NEGATIVE mg/dL   Hgb urine dipstick NEGATIVE  NEGATIVE   Bilirubin Urine NEGATIVE  NEGATIVE   Ketones, ur NEGATIVE  NEGATIVE  mg/dL   Protein, ur NEGATIVE  NEGATIVE mg/dL   Urobilinogen, UA 0.2  0.0 - 1.0 mg/dL   Nitrite NEGATIVE  NEGATIVE   Leukocytes, UA SMALL (*) NEGATIVE  URINE MICROSCOPIC-ADD ON      Result Value Ref Range   WBC, UA 3-6  <3 WBC/hpf   Bacteria, UA FEW (*) RARE     Imaging Review Dg Chest 2 View  06/23/2014   CLINICAL DATA:  LOSS OF CONSCIOUSNESS FALL  EXAM: CHEST  2 VIEW  COMPARISON:  Prior radiograph from 09/13/2013  FINDINGS: Cardiomegaly is stable from prior. Mediastinal silhouette within normal limits.  The lungs are mildly hypoinflated. Mild right basilar atelectasis present. No airspace consolidation, pleural effusion, or pulmonary edema is identified. There is no pneumothorax.  No acute osseous abnormality identified.  IMPRESSION: Shallow lung inflation with mild right basilar atelectasis. No other acute cardiopulmonary abnormality.   Electronically Signed   By: Jeannine Boga M.D.   On: 06/23/2014 00:05   Ct Head Wo Contrast  06/23/2014   CLINICAL DATA:  Fall, loss of consciousness, headache  EXAM: CT HEAD WITHOUT CONTRAST  TECHNIQUE: Contiguous axial images were obtained from the base of the skull through the vertex without intravenous contrast.  COMPARISON:  None.  FINDINGS: No skull fracture. No hemorrhage or extra-axial fluid. No infarct mass or hydrocephalus. Mild to moderate diffuse age-appropriate atrophy.  IMPRESSION: Age-related atrophy.  No acute traumatic injury.   Electronically Signed   By: Skipper Cliche M.D.   On: 06/23/2014 00:12     EKG Interpretation   Date/Time:  Saturday June 22 2014 22:10:37 EDT Ventricular Rate:  95 PR Interval:  167 QRS Duration: 111 QT Interval:  330 QTC Calculation: 415 R Axis:   30 Text Interpretation:  Sinus rhythm Inferior infarct, age indeterminate  Repol abnrm, severe global ischemia (LM/MVD) No significant change since  last tracing Confirmed by Simmie Camerer  MD, [place] [phone]) on 06/22/2014  10:48:48 PM      MDM   Final  diagnoses:  Fall, initial encounter  Memory loss of unknown cause   Patient with known history of memory problems. Patient with fall at home. Patient with no recollection of the fall. Workup here without evidence of any serious injuries. Patient has a history of falling suspect this was not true syncope. She had a fall hit her head and that resulted in the loss of consciousness. If there was any perhaps due to some memory deficit. Head CT negative. Patient now completely normal. No complaints. No orthostatic hypotension. Cardiac monitoring here without any arrhythmias for the past several hours. Chest x-ray without any significant abnormalities. Patient's normally on 2 L of oxygen patient's oxygen saturations have  been 93% on the oxygen. Patient has primary care Dr. for followup. Family members will bring her back if there is any development of syncope.  I personally performed the services described in this documentation, which was scribed in my presence. The recorded information has been reviewed and is accurate.       Fredia Sorrow, MD 06/23/14 [phone]

## 2014-06-22 NOTE — Assessment & Plan Note (Signed)
Ambulates with assistance due to high fall risk, from severe arthritis, no c/o uncontrolled pain

## 2014-06-22 NOTE — Assessment & Plan Note (Signed)
Daughter present is still concerned about short term memory loss, she has had the benefit of a 2nd neurologic opinion per request of family No new dx , no new meds. Advised family to continue to be diligent in their observations in the home environ Encouraged pt to continue brian stimulating activity

## 2014-06-22 NOTE — Assessment & Plan Note (Signed)
prevnar administered

## 2014-06-22 NOTE — Progress Notes (Signed)
   Subjective:    Patient ID: [name], female    DOB: 21-Nov-1940, 73 y.o.   MRN: [id number]  HPI The PT is here for follow up and re-evaluation of chronic medical conditions, medication management and review of any available recent lab and radiology data.  Preventive health is updated, specifically  Cancer screening and Immunization.   Questions or concerns regarding consultations or procedures which the PT has had in the interim are  addressed. The PT denies any adverse reactions to current medications since the last visit.  There are no new concerns.  There are no specific complaints , states neck pain and cionfusion are better, still concern over short term  memory loss voiced by her daughter      Review of Systems See HPI Denies recent fever or chills. Denies sinus pressure, nasal congestion, ear pain or sore throat. Denies chest congestion, productive cough has chronic cough and  wheezing. Denies chest pains, palpitations, leg swelling and leg swelling Denies abdominal pain, nausea, vomiting,diarrhea or constipation.   Denies dysuria, frequency, hesitancy does have  incontinence. C/o chronic joint pain,  and limitation in mobility. Denies headaches, seizures, numbness, or tingling. Denies uncontrolled depression, anxiety or insomnia. Denies skin break down or rash.        Objective:   Physical Exam BP 114/72  Pulse 78  Resp 16  Ht 6\' 1"  (1.854 m)  Wt 249 lb 1.9 oz (113 kg)  BMI 32.87 kg/m2  SpO2 95% Patient alert and oriented and in no cardiopulmonary distress.A t rest on supplemental oxygen  HEENT: No facial asymmetry, EOMI,   oropharynx pink and moist.  Neck decreased though adequate ROM, no JVD, no mass.  Chest: Decreased air entry throughout, npo wheeze or crackles  CVS: S1, S2 no murmurs, no S3.Regular rate.  ABD: Soft non tender.   Ext: No edema  MS: Adequate though markedly reduced  ROM spine, shoulders, hips and knees.  Skin: Intact, no  ulcerations or rash noted.  Psych: Good eye contact, normal affect. not anxious or depressed appearing.  CNS: CN 2-12 intact, power,  normal throughout.no focal deficits noted.        Assessment & Plan:  Need for vaccination with 13-polyvalent pneumococcal conjugate vaccine prevnar administered  HYPERTENSION Controlled, no change in medication   COPD (chronic obstructive pulmonary disease) No acute flare, has upcoming appt with pulmonary, to establish that current meds are most appropriate  Acute confusional state Daughter present is still concerned about short term memory loss, she has had the benefit of a 2nd neurologic opinion per request of family No new dx , no new meds. Advised family to continue to be diligent in their observations in the home environ Encouraged pt to continue brian stimulating activity  Cervical neck pain with evidence of disc disease No longer much concern over paoin and reduced mobility, this is better, will not pursue imaging studies , not indicated at this time.   Spinal stenosis Ambulates with assistance due to high fall risk, from severe arthritis, no c/o uncontrolled pain

## 2014-06-22 NOTE — Assessment & Plan Note (Signed)
Controlled, no change in medication  

## 2014-06-22 NOTE — Assessment & Plan Note (Signed)
No longer much concern over paoin and reduced mobility, this is better, will not pursue imaging studies , not indicated at this time.

## 2014-06-22 NOTE — ED Notes (Signed)
Pt called ems for a fall and states she passed out, but does not recall the fall or why she fell

## 2014-06-23 NOTE — Discharge Instructions (Signed)

## 2014-07-02 ENCOUNTER — Telehealth: Payer: Self-pay | Admitting: *Deleted

## 2014-07-02 ENCOUNTER — Telehealth: Payer: Self-pay | Admitting: Neurology

## 2014-07-02 NOTE — Telephone Encounter (Signed)
Left message for patient's daughter [name] to call our office back to set up an appointment with [facility] with next 6 weeks.

## 2014-07-02 NOTE — Telephone Encounter (Signed)
I called the patient. The EEG study was unremarkable. The patient indicates that her memory and cognitive processing has remained stable. She is working crossword puzzles, and she indicates that she is able to perform this without much difficulty. The blood work did show an abnormal blood gas, but this was at her baseline, no worsening. The patient did have a slightly elevated ammonia level, which could be producing some of her confusion. At this point, we will follow her mental status conservatively, I will try to get a work in revisit within the next 6 weeks. We will need to determine whether the mental status is stable or deteriorating. If deterioration is noted, lumbar puncture will need to be done in the future.

## 2014-07-05 ENCOUNTER — Telehealth: Payer: Self-pay | Admitting: *Deleted

## 2014-07-05 NOTE — Telephone Encounter (Signed)
Left message for Mrs. Lipscombs to call and schedule appointment with [facility] for her mother in about 6 weeks.

## 2014-07-10 ENCOUNTER — Other Ambulatory Visit (HOSPITAL_COMMUNITY): Payer: Self-pay

## 2014-07-18 ENCOUNTER — Ambulatory Visit (HOSPITAL_COMMUNITY)
Admission: RE | Admit: 2014-07-18 | Discharge: 2014-07-18 | Disposition: A | Discharge status: Home / Self Care | Payer: Medicare PPO | Source: Ambulatory Visit | Attending: Pulmonary Disease | Admitting: Pulmonary Disease

## 2014-07-18 DIAGNOSIS — J4489 Other specified chronic obstructive pulmonary disease: Secondary | ICD-10-CM | POA: Insufficient documentation

## 2014-07-18 DIAGNOSIS — J449 Chronic obstructive pulmonary disease, unspecified: Secondary | ICD-10-CM | POA: Diagnosis present

## 2014-07-18 LAB — PULMONARY FUNCTION TEST
DL/VA % PRED: 54 %
DL/VA: 3.12 ml/min/mmHg/L
DLCO UNC % PRED: 35 %
DLCO UNC: 12.96 ml/min/mmHg
DLCO cor % pred: 35 %
DLCO cor: 12.96 ml/min/mmHg
FEF 25-75 Post: 1.57 L/sec
FEF 25-75 Pre: 0.73 L/sec
FEF2575-%Change-Post: 113 %
FEF2575-%Pred-Post: 69 %
FEF2575-%Pred-Pre: 32 %
FEV1-%Change-Post: 21 %
FEV1-%Pred-Post: 65 %
FEV1-%Pred-Pre: 54 %
FEV1-Post: 1.72 L
FEV1-Pre: 1.42 L
FEV1FVC-%CHANGE-POST: 2 %
FEV1FVC-%Pred-Pre: 86 %
FEV6-%Change-Post: 17 %
FEV6-%Pred-Post: 76 %
FEV6-%Pred-Pre: 64 %
FEV6-POST: 2.49 L
FEV6-PRE: 2.12 L
FEV6FVC-%CHANGE-POST: 0 %
FEV6FVC-%PRED-PRE: 102 %
FEV6FVC-%Pred-Post: 101 %
FVC-%CHANGE-POST: 18 %
FVC-%PRED-PRE: 63 %
FVC-%Pred-Post: 75 %
FVC-PRE: 2.14 L
FVC-Post: 2.52 L
Post FEV1/FVC ratio: 68 %
Post FEV6/FVC ratio: 99 %
Pre FEV1/FVC ratio: 66 %
Pre FEV6/FVC Ratio: 99 %
RV % pred: 119 %
RV: 3.22 L
TLC % pred: 92 %
TLC: 5.94 L

## 2014-07-18 MED ORDER — ALBUTEROL SULFATE (2.5 MG/3ML) 0.083% IN NEBU
2.5000 mg | INHALATION_SOLUTION | Freq: Once | RESPIRATORY_TRACT | Status: AC
  Administered 2014-07-18: 2.5 mg via RESPIRATORY_TRACT

## 2014-09-04 ENCOUNTER — Ambulatory Visit (INDEPENDENT_AMBULATORY_CARE_PROVIDER_SITE_OTHER): Payer: Medicare PPO

## 2014-09-04 DIAGNOSIS — Z23 Encounter for immunization: Secondary | ICD-10-CM

## 2014-09-12 DIAGNOSIS — H9202 Otalgia, left ear: Secondary | ICD-10-CM | POA: Insufficient documentation

## 2014-09-12 NOTE — Assessment & Plan Note (Signed)
Controlled, no change in medication  

## 2014-09-12 NOTE — Assessment & Plan Note (Signed)
Severe long standing COPD.Oxygen dependent Family wants pulmonary evaluation and referral made

## 2014-09-12 NOTE — Assessment & Plan Note (Addendum)
Pt still awaiting neuro eval and family anxious to have this done. Imaging studies are non revealing will f/u referral info. Clinically pt appears to  Be at baseline

## 2014-09-12 NOTE — Assessment & Plan Note (Signed)
Referred  From c spine disease, ear exam is normal on 06/2014, pt to have xray  And use med and possibly PT for chronic neck pain

## 2014-09-12 NOTE — Assessment & Plan Note (Signed)
For renal protection , pt to use topical rubs and tylenol only, fall risk reduction also discussed

## 2014-09-16 ENCOUNTER — Encounter (HOSPITAL_COMMUNITY): Payer: Self-pay | Admitting: Emergency Medicine

## 2014-09-19 ENCOUNTER — Telehealth: Payer: Self-pay | Admitting: Neurology

## 2014-09-19 NOTE — Telephone Encounter (Signed)
Patient's daughter called to cancel her appointment tomorrow at 8 since she has no transportation, wants to reschedule to sometime next week, please return call and advise.

## 2014-09-19 NOTE — Telephone Encounter (Signed)
Spoke to daughter and appointment is still scheduled for 09-20-14 at 12noon.

## 2014-09-19 NOTE — Telephone Encounter (Signed)
Patient's daughter calling again to state that she wants to keep that 12 pm appointment tomorrow.

## 2014-09-20 ENCOUNTER — Encounter: Payer: Self-pay | Admitting: Neurology

## 2014-09-20 ENCOUNTER — Ambulatory Visit (INDEPENDENT_AMBULATORY_CARE_PROVIDER_SITE_OTHER): Payer: Medicare PPO | Admitting: Neurology

## 2014-09-20 ENCOUNTER — Ambulatory Visit: Payer: Medicare PPO | Admitting: Neurology

## 2014-09-20 VITALS — BP 149/97 | HR 90 | Ht 73.0 in | Wt 218.0 lb

## 2014-09-20 DIAGNOSIS — R413 Other amnesia: Secondary | ICD-10-CM

## 2014-09-20 DIAGNOSIS — R269 Unspecified abnormalities of gait and mobility: Secondary | ICD-10-CM

## 2014-09-20 HISTORY — DX: Other amnesia: R41.3

## 2014-09-20 NOTE — Patient Instructions (Signed)

## 2014-09-20 NOTE — Progress Notes (Signed)
Reason for visit: memory disorder  [name] is an 73 y.o. female  History of present illness:  [name] is a 73 year old right-handed black female with a history of a memory disorder. According to the family, the patient had a sudden change in her memory and cognitive issues that occurred in May 2015. Since that time, her cognitive status has been quite stable. The patient has never gotten back to her baseline. The patient had a bladder infection around the onset of her cognitive deficits. MRI of the brain did not show an acute stroke, but patient does have a history of cerebrovascular disease with bilateral medullary strokes. The patient has undergone blood work that shows a mild elevation in ammonia level, and the PCO2 was 55, but this is at or near her baseline. The patient requires assistance to keep up with medications now, but she otherwise lives alone, able to bathe and dress her self, and she does some cooking. She has had occasional falls, she uses a walker for ambulation. 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   Past Medical History  Diagnosis Date  . Allergic rhinitis   . Anemia   . Anxiety   . Depression   . GERD (gastroesophageal reflux disease)   . Hypertension   . Low back pain   . Arthritis     abnormal gait   . OSA on CPAP   . Degenerative disc disease, lumbar     L5 nerve impingement   . 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 Arm fracture, left   . Onychomycosis   . Ankle fracture, right   . Hyperglycemia   . Mediastinal lymphadenopathy 1/9    resolving   . COPD (chronic obstructive pulmonary disease)     chronic CO2 retention, decreased DLCO   . Cystic acne     adult   . Sleep apnea     stop bang score 6  . History of colon surgery     right hemicolectomy for high-grade adenomas in right colon 2013  . Abnormality of gait 06/06/2014  . Memory difficulty 09/20/2014    Past Surgical History    Procedure Laterality Date  . Abdominal hysterectomy  1976    secondary to bleeding   . Oophorectomy  1976  . Epidural steroids    . Orif ankle fracture  01/18/2012    Procedure: OPEN REDUCTION INTERNAL FIXATION (ORIF) ANKLE FRACTURE;  Surgeon: Arther Abbott, MD;  Location: [facility];  Service: Orthopedics;  Laterality: Left;  . Colonoscopy  07/20/2012    Dr. Gala Romney: multiple colonic polyps with large polyps on right side, s/p saline-assisted debulking piecemeal polypectomy and ablation. Not all removed. Path with tubulovillous adenomas, high grade dysplasia.   [name] Appendectomy    . Colon resection  08/18/2012    Procedure: HAND ASSISTED LAPAROSCOPIC COLON RESECTION;  Surgeon: Jamesetta So, MD;  Location: [facility];  Service: General;  Laterality: N/A;  . Colonoscopy N/A 03/09/2013    [id number] polyps-tubular adenomas. S/p right hemicolectomy. next tcs 02/2018  . Esophagogastroduodenoscopy (egd) with esophageal dilation N/A 02/06/2014    Procedure: ESOPHAGOGASTRODUODENOSCOPY (EGD) WITH ESOPHAGEAL DILATION;  Surgeon: Daneil Dolin, MD;  Location: [facility];  Service: Endoscopy;  Laterality: N/A;  9:30    Family History  Problem Relation Age of Onset  . Stomach cancer Father   . Breast cancer Father   . Cancer Father   .  Cancer Sister   . Hypertension Son   . Cancer Mother   . Arthritis      Social history:  reports that she quit smoking about 8 years ago. Her smoking use included Cigarettes. She has a 60 pack-year smoking history. She has never used smokeless tobacco. She reports that she does not drink alcohol or use illicit drugs.    Allergies  Allergen Reactions  . Fenofibrate Nausea And Vomiting  . Pravastatin Sodium Nausea And Vomiting  . Sulfonamide Derivatives Other (See Comments)    unknown  . Ciprofloxacin Rash    Medications:  Current Outpatient Prescriptions on File Prior to Visit  Medication Sig Dispense Refill  . acetaminophen (TYLENOL) 325 MG tablet Take 325 mg by  mouth 2 (two) times daily.     [name] alendronate (FOSAMAX) 70 MG tablet Take 1 tablet (70 mg total) by mouth once a week. Take with a full glass of water on an empty stomach. 4 tablet 11  . arformoterol (BROVANA) 15 MCG/2ML NEBU Take 2 mLs (15 mcg total) by nebulization 2 (two) times daily. 360 mL 1  . aspirin EC 81 MG tablet Take 1 tablet (81 mg total) by mouth daily. 100 tablet 3  . atorvastatin (LIPITOR) 10 MG tablet Take 1 tablet (10 mg total) by mouth daily. 90 tablet 1  . calcium-vitamin D (OSCAL WITH D) 500-200 MG-UNIT per tablet Take 1 tablet by mouth daily.     [name] FLUoxetine (PROZAC) 40 MG capsule Take 1 capsule (40 mg total) by mouth daily. 90 capsule 1  . Multiple Vitamins-Minerals (CEROVITE SENIOR) TABS Take 1 tablet by mouth daily.     [name] omeprazole (PRILOSEC) 40 MG capsule Take 1 capsule (40 mg total) by mouth daily. 90 capsule 1  . verapamil (CALAN-SR) 240 MG CR tablet Take 1 tablet (240 mg total) by mouth at bedtime. 90 tablet 1  . fluticasone (FLONASE) 50 MCG/ACT nasal spray Place 1 spray into the nose daily. 16 g 2   No current facility-administered medications on file prior to visit.    ROS:  Out of a complete 14 system review of symptoms, the patient complains only of the following symptoms, and all other reviewed systems are negative.  Wheezing, shortness of breath Snoring Joint pain Memory loss Confusion, depression  Blood pressure 149/97, pulse 90, height 6\' 1"  (1.854 m), weight 218 lb (98.884 kg).  Physical Exam  General: The patient is alert and cooperative at the time of the examination.  Skin: No significant peripheral edema is noted.   Neurologic Exam  Mental status: The Mini-Mental status examination done today was 26/30.  Cranial nerves: Facial symmetry is present. Speech is normal, no aphasia or dysarthria is noted. Extraocular movements are full. Visual fields are full.  Motor: The patient has good strength in all 4 extremities.  Sensory  examination: Soft touch sensation is symmetric on the face, arms, and legs.  Coordination: The patient has good finger-nose-finger and heel-to-shin bilaterally.  Gait and station: The patient has a wide-based, unsteady gait. The patient uses a walker for ambulation. Tandem gait was not attempted. Romberg is negative. No drift is seen.  Reflexes: Deep tendon reflexes are symmetric.   Assessment/Plan:  1. Memory disturbance  2. Gait disturbance  3. Cerebrovascular disease  The patient has had a sudden change in her mental status that occurred in May 2015. The family indicates that she was out fishing, may have sustained a heat stroke, but she also had a bladder infection. The  patient has not had any acute changes by MRI of the brain. The etiology of her change in mental status is not clear. The patient will be followed over time, and if there is progression of her cognitive functioning level, medications such as Aricept can be added.  Jill Alexanders MD 09/20/2014 5:20 PM  [facility] [address] [place], [place] [address]  Phone [phone] Fax [phone]

## 2014-11-04 ENCOUNTER — Telehealth: Payer: Self-pay | Admitting: Family Medicine

## 2014-11-05 NOTE — Telephone Encounter (Signed)
Daughter called back and I made patient an appointment

## 2014-11-14 ENCOUNTER — Ambulatory Visit (INDEPENDENT_AMBULATORY_CARE_PROVIDER_SITE_OTHER): Payer: Medicare PPO | Admitting: Family Medicine

## 2014-11-14 ENCOUNTER — Encounter: Payer: Self-pay | Admitting: Family Medicine

## 2014-11-14 VITALS — BP 136/82 | HR 100 | Resp 20 | Ht 75.0 in | Wt 257.0 lb

## 2014-11-14 DIAGNOSIS — Z0000 Encounter for general adult medical examination without abnormal findings: Secondary | ICD-10-CM

## 2014-11-14 DIAGNOSIS — R413 Other amnesia: Secondary | ICD-10-CM

## 2014-11-14 DIAGNOSIS — E785 Hyperlipidemia, unspecified: Secondary | ICD-10-CM

## 2014-11-14 MED ORDER — DONEPEZIL HCL 5 MG PO TABS: 5.0000 mg | ORAL_TABLET | Freq: Every day | ORAL | Status: DC

## 2014-11-14 NOTE — Patient Instructions (Signed)
Annual physical exam in 4 month, call if you need me before  Fasting lipid today  New for memory impairment is aricept one every evening  Please eat more fruit and less sweets/ donuts , work on 5 pound weight loss  Careful not to fall

## 2014-11-14 NOTE — Progress Notes (Signed)
Subjective:    Patient ID: [name], female    DOB: 01-05-1941, 73 y.o.   MRN: [id number]  HPI Preventive Screening-Counseling & Management   Patient present here today for a Medicare annual wellness visit.   Current Problems (verified)   Medications Prior to Visit Allergies (verified)   PAST HISTORY  Family History (updated)  Social History retired Oncologist   Risk Factors  Current exercise habits: limited due to mobility , chair exercises encouraged  Dietary issues discussed: Needs to decrease sweets, increase veg and fruit   Cardiac risk factors: htn   Depression Screen  (Note: if answer to either of the following is "Yes", a more complete depression screening is indicated)   Over the past two weeks, have you felt down, depressed or hopeless? Yes Over the past two weeks, have you felt little interest or pleasure in doing things? No  Have you lost interest or pleasure in daily life? Yes  Do you often feel hopeless? No  Do you cry easily over simple problems? Yes  Activities of Daily Living  In your present state of health, do you have any difficulty performing the following activities?  Driving?: Yes Managing money?: Yes, handled by daughters  Feeding yourself?: No Getting from bed to chair?:yes Climbing a flight of stairs?:yes Preparing food and eating?:yes incapable of fixing a fuull meal, able to eat Bathing or showering?:No Getting dressed?:No  Getting to the toilet?:No Using the toilet?:No Moving around from place to place?: yes needs assistive device  Fall Risk Assessment In the past year have you fallen or had a near fall?:Yes, two falls, right knee instability Are you currently taking any medications that make you dizziness?:No   Hearing Difficulties: No Do you often ask people to speak up or repeat themselves?:No Do you experience ringing or noises in your ears?:No Do you have difficulty understanding soft or whispered  voices?:No  Cognitive Testing  Alert? Yes Normal Appearance?Yes  Oriented to person? Yes Place? Yes  Time? Yes  Displays appropriate judgment? Intermittent confusion  Can read the correct time from a watch face? yes Are you having problems remembering things? Yes  Advanced Directives have been discussed with the patient?Yes and brochure provided     List the Names of Other Physician/Practitioners you currently use: Dr. Luan Pulling, Dr. Gala Romney, Dr. Evonnie Pat any recent Medical Services you may have received from other than [facility] providers in the past year (date may be approximate).   Assessment:    Annual Wellness Exam   Plan:     Medicare Attestation  I have personally reviewed:  The patient's medical and social history  Their use of alcohol, tobacco or illicit drugs  Their current medications and supplements  The patient's functional ability including ADLs,fall risks, home safety risks, cognitive, and hearing and visual impairment  Diet and physical activities  Evidence for depression or mood disorders  The patient's weight, height, BMI, and visual acuity have been recorded in the chart. I have made referrals, counseling, and provided education to the patient based on review of the above and I have provided the patient with a written personalized care plan for preventive services.      Review of Systems     Objective:   Physical Exam BP 136/82 mmHg  Pulse 100  Resp 20  Ht 6\' 3"  (1.905 m)  Wt 257 lb (116.574 kg)  BMI 32.12 kg/m2  SpO2 90%        Assessment &  Plan:  Medicare annual wellness visit, subsequent Annual exam as documented. Counseling done  re healthy lifestyle involving commitment to daily exercise , reduced sweet and  Carb intake, heart healthy diet, and attaining healthy weight.The importance of adequate sleep also discussed. Regular seat belt use and home safety, is also discussed. Changes in health habits are decided on by the patient with  goals and time frames  set for achieving them. Immunization and cancer screening needs are specifically addressed at this visit.   Memory difficulty Daughter who accompanies her  Mom to this visit, requests that pt be afforded a trial of aricept, as the family remains concerned that [name] continues to show evidence of impaired memory Aricept at 5mg  daily is prescribed, major s/e , adverse of GI disturbance is discussed

## 2014-11-15 DIAGNOSIS — Z0000 Encounter for general adult medical examination without abnormal findings: Secondary | ICD-10-CM | POA: Insufficient documentation

## 2014-11-15 LAB — LIPID PANEL
Cholesterol: 172 mg/dL (ref 0–200)
HDL: 56 mg/dL (ref >39)
LDL CALC: 84 mg/dL (ref 0–99)
Total CHOL/HDL Ratio: 3.1 Ratio
Triglycerides: 162 mg/dL — ABNORMAL HIGH (ref <150)
VLDL: 32 mg/dL (ref 0–40)

## 2014-11-15 NOTE — Assessment & Plan Note (Signed)
Annual exam as documented. Counseling done  re healthy lifestyle involving commitment to daily exercise , reduced sweet and  Carb intake, heart healthy diet, and attaining healthy weight.The importance of adequate sleep also discussed. Regular seat belt use and home safety, is also discussed. Changes in health habits are decided on by the patient with goals and time frames  set for achieving them. Immunization and cancer screening needs are specifically addressed at this visit.

## 2014-11-15 NOTE — Assessment & Plan Note (Signed)
Daughter who accompanies her  Mom to this visit, requests that pt be afforded a trial of aricept, as the family remains concerned that Ms. Leven continues to show evidence of impaired memory Aricept at 5mg  daily is prescribed, major s/e , adverse of GI disturbance is discussed

## 2014-12-09 ENCOUNTER — Ambulatory Visit: Payer: Medicare PPO | Admitting: Family Medicine

## 2015-03-13 ENCOUNTER — Encounter: Payer: Self-pay | Admitting: Family Medicine

## 2015-03-13 ENCOUNTER — Ambulatory Visit (INDEPENDENT_AMBULATORY_CARE_PROVIDER_SITE_OTHER): Payer: Medicare PPO | Admitting: Family Medicine

## 2015-03-13 VITALS — BP 146/82 | HR 90 | Resp 18 | Ht 72.0 in | Wt 263.1 lb

## 2015-03-13 DIAGNOSIS — Z1211 Encounter for screening for malignant neoplasm of colon: Secondary | ICD-10-CM

## 2015-03-13 DIAGNOSIS — Z0000 Encounter for general adult medical examination without abnormal findings: Secondary | ICD-10-CM | POA: Diagnosis not present

## 2015-03-13 DIAGNOSIS — E785 Hyperlipidemia, unspecified: Secondary | ICD-10-CM

## 2015-03-13 DIAGNOSIS — I10 Essential (primary) hypertension: Secondary | ICD-10-CM

## 2015-03-13 LAB — LIPID PANEL
CHOL/HDL RATIO: 3 ratio
Cholesterol: 181 mg/dL (ref 0–200)
HDL: 61 mg/dL (ref >=46)
LDL CALC: 90 mg/dL (ref 0–99)
Triglycerides: 150 mg/dL — ABNORMAL HIGH (ref <150)
VLDL: 30 mg/dL (ref 0–40)

## 2015-03-13 LAB — CBC WITH DIFFERENTIAL/PLATELET
BASOS PCT: 0 % (ref 0–1)
Basophils Absolute: 0 10*3/uL (ref 0.0–0.1)
Eosinophils Absolute: 0.3 10*3/uL (ref 0.0–0.7)
Eosinophils Relative: 4 % (ref 0–5)
HCT: 37.2 % (ref 36.0–46.0)
Hemoglobin: 11.6 g/dL — ABNORMAL LOW (ref 12.0–15.0)
LYMPHS PCT: 22 % (ref 12–46)
Lymphs Abs: 1.4 10*3/uL (ref 0.7–4.0)
MCH: 26 pg (ref 26.0–34.0)
MCHC: 31.2 g/dL (ref 30.0–36.0)
MCV: 83.2 fL (ref 78.0–100.0)
MONO ABS: 0.4 10*3/uL (ref 0.1–1.0)
MPV: 10.3 fL (ref 8.6–12.4)
Monocytes Relative: 6 % (ref 3–12)
NEUTROS ABS: 4.3 10*3/uL (ref 1.7–7.7)
NEUTROS PCT: 68 % (ref 43–77)
PLATELETS: 279 10*3/uL (ref 150–400)
RBC: 4.47 MIL/uL (ref 3.87–5.11)
RDW: 15.2 % (ref 11.5–15.5)
WBC: 6.3 10*3/uL (ref 4.0–10.5)

## 2015-03-13 LAB — COMPREHENSIVE METABOLIC PANEL
ALT: 15 U/L (ref 0–35)
AST: 17 U/L (ref 0–37)
Albumin: 4.3 g/dL (ref 3.5–5.2)
Alkaline Phosphatase: 77 U/L (ref 39–117)
BUN: 21 mg/dL (ref 6–23)
CALCIUM: 9.5 mg/dL (ref 8.4–10.5)
CHLORIDE: 96 meq/L (ref 96–112)
CO2: 34 mEq/L — ABNORMAL HIGH (ref 19–32)
Creat: 0.99 mg/dL (ref 0.50–1.10)
Glucose, Bld: 98 mg/dL (ref 70–99)
POTASSIUM: 4.1 meq/L (ref 3.5–5.3)
Sodium: 144 mEq/L (ref 135–145)
Total Bilirubin: 0.5 mg/dL (ref 0.2–1.2)
Total Protein: 6.8 g/dL (ref 6.0–8.3)

## 2015-03-13 LAB — POC HEMOCCULT BLD/STL (OFFICE/1-CARD/DIAGNOSTIC): Fecal Occult Blood, POC: NEGATIVE

## 2015-03-13 NOTE — Patient Instructions (Addendum)
Annual wellness in mid September , please call if you need me before  CBc, fasting lipid, cmp today   Please get mammogram when next due  Please start aricept  Please be careful NOT TO FALL  Fall Prevention and Home Safety Falls cause injuries and can affect all age groups. It is possible to prevent falls.  HOW TO PREVENT FALLS  Wear shoes with rubber soles that do not have an opening for your toes.  Keep the inside and outside of your house well lit.  Use night lights throughout your home.  Remove clutter from floors.  Clean up floor spills.  Remove throw rugs or fasten them to the floor with carpet tape.  Do not place electrical cords across pathways.  Put grab bars by your tub, shower, and toilet. Do not use towel bars as grab bars.  Put handrails on both sides of the stairway. Fix loose handrails.  Do not climb on stools or stepladders, if possible.  Do not wax your floors.  Repair uneven or unsafe sidewalks, walkways, or stairs.  Keep items you use a lot within reach.  Be aware of pets.  Keep emergency numbers next to the telephone.  Put smoke detectors in your home and near bedrooms. Ask your doctor what other things you can do to prevent falls. Document Released: 08/28/2009 Document Revised: 05/02/2012 Document Reviewed: 02/01/2012 [facility] Patient Information 2015 [place], [place]. This information is not intended to replace advice given to you by your health care provider. Make sure you discuss any questions you have with your health care provider.

## 2015-03-13 NOTE — Assessment & Plan Note (Signed)

## 2015-03-14 NOTE — Assessment & Plan Note (Signed)
Elevated at visit, however no change in med, pt has not had medication on day of visit as she is fasting for labs, I explained Ok to take meds on empty stomach unless she has adverse s/e

## 2015-03-14 NOTE — Progress Notes (Signed)
   Subjective:    Patient ID: [name], female    DOB: 04-17-1941, 74 y.o.   MRN: [id number]  HPI Patient is in for annual physical exam. No other health concerns are expressed  at the visit. . Immunization is reviewed , and  updated if needed.    Review of Systems See HPI     Objective:   Physical Exam BP 146/82 mmHg  Pulse 90  Resp 18  Ht 6' (1.829 m)  Wt 263 lb 1.9 oz (119.35 kg)  BMI 35.68 kg/m2  SpO2 90%  Pleasant well nourished female, alert and oriented x 3, in no cardio-pulmonary distress.Dependent on supplemental oxygen Afebrile. HEENT No facial trauma or asymetry. Sinuses non tender.  Extra occullar muscles intact, pupils equally reactive to light. External ears normal, tympanic membranes clear.Partial obstruction by cerumen noted Oropharynx moist, no exudate, fair  dentition. Neck: decreased ROM, no adenopathy,JVD or thyromegaly.No bruits.  Chest: Clear to ascultation bilaterally.No crackles or wheezes.Markedly reduced air entry throughout , pt is oxygen dependent Non tender to palpation  Breast: No asymetry,no masses or lumps. No tenderness. No nipple discharge or inversion. No axillary or supraclavicular adenopathy  Cardiovascular system; Heart sounds normal,  S1 and  S2 ,no S3.  No murmur, or thrill. Apical beat not displaced Peripheral pulses normal.  Abdomen: Soft, non tender, no organomegaly or masses. No bruits. Bowel sounds normal. No guarding, tenderness or rebound.  Rectal:  Normal sphincter tone. No mass.No rectal masses.  Guaiac negative stool.  GU: External genitalia normal female genitalia , female distribution of hair. No lesions. Urethral meatus normal in size, no  Prolapse, no lesions visibly  Present. Bladder non tender. Vagina pink and moist , with no visible lesions , discharge present . Adequate pelvic support no  cystocele or rectocele noted Uterus absent, no adnexal masses, no adnexal  tenderness.   Musculoskeletal exam: Decreased  ROM of spine, hips , shoulders and knees. . No muscle wasting or atrophy.   Neurologic: Cranial nerves 2 to 12 intact. Power, tone ,sensation and reflexes normal throughout. No tremor.abnormal  Gait due to severe osteoarthritis, relies on assistive device , walker for safe mobility  Skin: Intact, no ulceration, erythema , scaling or rash noted. Pigmentation normal throughout  Psych; Normal mood and affect. Judgement and concentration normal        Assessment & Plan:  Annual physical exam Annual exam as documented. Counseling done  re healthy lifestyle involving commitment to 150 minutes exercise per week, heart healthy diet, and attaining healthy weight.The importance of adequate sleep also discussed. Regular seat belt use and home safety, is also discussed. Changes in health habits are decided on by the patient with goals and time frames  set for achieving them. Immunization and cancer screening needs are specifically addressed at this visit.    Essential hypertension Elevated at visit, however no change in med, pt has not had medication on day of visit as she is fasting for labs, I explained Ok to take meds on empty stomach unless she has adverse s/e

## 2015-03-17 ENCOUNTER — Other Ambulatory Visit: Payer: Self-pay

## 2015-03-17 DIAGNOSIS — F329 Major depressive disorder, single episode, unspecified: Secondary | ICD-10-CM

## 2015-03-17 DIAGNOSIS — R413 Other amnesia: Secondary | ICD-10-CM

## 2015-03-17 DIAGNOSIS — F32A Depression, unspecified: Secondary | ICD-10-CM

## 2015-03-17 DIAGNOSIS — E785 Hyperlipidemia, unspecified: Secondary | ICD-10-CM

## 2015-03-17 DIAGNOSIS — I10 Essential (primary) hypertension: Secondary | ICD-10-CM

## 2015-03-17 MED ORDER — DONEPEZIL HCL 5 MG PO TABS: 5.0000 mg | ORAL_TABLET | Freq: Every day | ORAL | Status: DC

## 2015-03-17 MED ORDER — VERAPAMIL HCL ER 240 MG PO TBCR: 240.0000 mg | EXTENDED_RELEASE_TABLET | Freq: Every day | ORAL | Status: DC

## 2015-03-17 MED ORDER — ALENDRONATE SODIUM 70 MG PO TABS: 70.0000 mg | ORAL_TABLET | ORAL | Status: DC

## 2015-03-17 MED ORDER — OMEPRAZOLE 40 MG PO CPDR: 40.0000 mg | DELAYED_RELEASE_CAPSULE | Freq: Every day | ORAL | Status: DC

## 2015-03-17 MED ORDER — ATORVASTATIN CALCIUM 10 MG PO TABS: 10.0000 mg | ORAL_TABLET | Freq: Every day | ORAL | Status: DC

## 2015-03-17 MED ORDER — FLUOXETINE HCL 40 MG PO CAPS: 40.0000 mg | ORAL_CAPSULE | Freq: Every day | ORAL | Status: DC

## 2015-03-26 ENCOUNTER — Ambulatory Visit: Payer: Medicare PPO | Admitting: Nurse Practitioner

## 2015-04-21 ENCOUNTER — Encounter: Payer: Self-pay | Admitting: Nurse Practitioner

## 2015-04-21 ENCOUNTER — Ambulatory Visit (INDEPENDENT_AMBULATORY_CARE_PROVIDER_SITE_OTHER): Payer: Medicare PPO | Admitting: Nurse Practitioner

## 2015-04-21 VITALS — BP 132/72 | HR 101 | Ht 73.0 in | Wt 208.2 lb

## 2015-04-21 DIAGNOSIS — R413 Other amnesia: Secondary | ICD-10-CM

## 2015-04-21 NOTE — Progress Notes (Signed)
I have read the note, and I agree with the clinical assessment and plan.  [name]   

## 2015-04-21 NOTE — Progress Notes (Signed)
[facility]  PATIENT: [name] DOB: Aug 27, 1941   REASON FOR VISIT: Follow-up for memory disorder HISTORY FROM: Patient, daughter    HISTORY OF PRESENT ILLNESS: [name], 74 year old black female returns for follow-up. She was last seen by Dr. Jannifer Franklin 09/20/2014. She has a history of sudden change in her memory in May 2015. She had a bladder infection around that time. She also has a history of cerebrovascular disease but MRI done in May 2015 did not show anything acute. She is O2 dependent chronic obstructive pulmonary disease. She has been placed on Aricept by her primary care Dr. Moshe Cipro about 3 weeks ago. Her memory score today is the same as it was in November of last year 26 out of 30. She lives alone but her daughters check on her frequently. She is independent in ADLs. She uses a rolling walker no recent falls. She does not drive. She has [employer]. She returns for reevaluation   HISTORY: [name] is a 74 year old right-handed black female with a history of a memory disorder. According to the family, the patient had a sudden change in her memory and cognitive issues that occurred in May 2015. Since that time, her cognitive status has been quite stable. The patient has never gotten back to her baseline. The patient had a bladder infection around the onset of her cognitive deficits. MRI of the brain did not show an acute stroke, but patient does have a history of cerebrovascular disease with bilateral medullary strokes. The patient has undergone blood work that shows a mild elevation in ammonia level, and the PCO2 was 55, but this is at or near her baseline. The patient requires assistance to keep up with medications now, but she otherwise lives alone, able to bathe and dress her self, and she does some cooking. She has had occasional falls, she uses a walker for ambulation. 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     REVIEW  OF SYSTEMS: Full 14 system review of systems performed and notable only for those listed, all others are neg:  Constitutional: Fatigue  Cardiovascular: neg Ear/Nose/Throat: neg  Skin: neg Eyes: neg Respiratory: Shortness of breath O2 dependent Gastroitestinal: neg  Hematology/Lymphatic: neg  Endocrine: neg Musculoskeletal: Walking difficulty  Allergy/Immunology: neg Neurological: Memory loss Psychiatric: neg Sleep : neg   ALLERGIES: Allergies  Allergen Reactions  . Fenofibrate Nausea And Vomiting  . Pravastatin Sodium Nausea And Vomiting  . Sulfonamide Derivatives Other (See Comments)    unknown  . Ciprofloxacin Rash    HOME MEDICATIONS: Outpatient Prescriptions Prior to Visit  Medication Sig Dispense Refill  . acetaminophen (TYLENOL) 325 MG tablet Take 325 mg by mouth 2 (two) times daily.     [name] alendronate (FOSAMAX) 70 MG tablet Take 1 tablet (70 mg total) by mouth once a week. Take with a full glass of water on an empty stomach. 12 tablet 1  . arformoterol (BROVANA) 15 MCG/2ML NEBU Take 2 mLs (15 mcg total) by nebulization 2 (two) times daily. 360 mL 1  . aspirin EC 81 MG tablet Take 1 tablet (81 mg total) by mouth daily. 100 tablet 3  . atorvastatin (LIPITOR) 10 MG tablet Take 1 tablet (10 mg total) by mouth daily. 90 tablet 1  . calcium-vitamin D (OSCAL WITH D) 500-200 MG-UNIT per tablet Take 1 tablet by mouth daily.     [name] donepezil (ARICEPT) 5 MG tablet Take 1 tablet (5 mg total) by mouth at  bedtime. 90 tablet 1  . FLUoxetine (PROZAC) 40 MG capsule Take 1 capsule (40 mg total) by mouth daily. 90 capsule 1  . Multiple Vitamins-Minerals (CEROVITE SENIOR) TABS Take 1 tablet by mouth daily.     [name] omeprazole (PRILOSEC) 40 MG capsule Take 1 capsule (40 mg total) by mouth daily. 90 capsule 1  . PROAIR RESPICLICK 601 (90 BASE) MCG/ACT AEPB as needed.    . verapamil (CALAN-SR) 240 MG CR tablet Take 1 tablet (240 mg total) by mouth at bedtime. 90 tablet 1   No  facility-administered medications prior to visit.    PAST MEDICAL HISTORY: Past Medical History  Diagnosis Date  . Allergic rhinitis   . Anemia   . Anxiety   . Depression   . GERD (gastroesophageal reflux disease)   . Hypertension   . Low back pain   . Arthritis     abnormal gait   . OSA on CPAP   . Degenerative disc disease, lumbar     L5 nerve impingement   . 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 Arm fracture, left   . Onychomycosis   . Ankle fracture, right   . Hyperglycemia   . Mediastinal lymphadenopathy 1/9    resolving   . COPD (chronic obstructive pulmonary disease)     chronic CO2 retention, decreased DLCO   . Cystic acne     adult   . Sleep apnea     stop bang score 6  . History of colon surgery     right hemicolectomy for high-grade adenomas in right colon 2013  . Abnormality of gait 06/06/2014  . Memory difficulty 09/20/2014    PAST SURGICAL HISTORY: Past Surgical History  Procedure Laterality Date  . Abdominal hysterectomy  1976    secondary to bleeding   . Oophorectomy  1976  . Epidural steroids    . Orif ankle fracture  01/18/2012    Procedure: OPEN REDUCTION INTERNAL FIXATION (ORIF) ANKLE FRACTURE;  Surgeon: Arther Abbott, MD;  Location: [facility];  Service: Orthopedics;  Laterality: Left;  . Colonoscopy  07/20/2012    Dr. Gala Romney: multiple colonic polyps with large polyps on right side, s/p saline-assisted debulking piecemeal polypectomy and ablation. Not all removed. Path with tubulovillous adenomas, high grade dysplasia.   [name] Appendectomy    . Colon resection  08/18/2012    Procedure: HAND ASSISTED LAPAROSCOPIC COLON RESECTION;  Surgeon: Jamesetta So, MD;  Location: [facility];  Service: General;  Laterality: N/A;  . Colonoscopy N/A 03/09/2013    UXN:ATFTDDU polyps-tubular adenomas. S/p right hemicolectomy. next tcs 02/2018  . Esophagogastroduodenoscopy (egd) with esophageal dilation N/A 02/06/2014    Procedure: ESOPHAGOGASTRODUODENOSCOPY  (EGD) WITH ESOPHAGEAL DILATION;  Surgeon: Daneil Dolin, MD;  Location: [facility];  Service: Endoscopy;  Laterality: N/A;  9:30    FAMILY HISTORY: Family History  Problem Relation Age of Onset  . Stomach cancer Father   . Cancer Father   . Cancer Mother     brain   . Breast cancer Mother   . Arthritis      SOCIAL HISTORY: History   Social History  . Marital Status: Widowed    Spouse Name: N/A  . Number of Children: 4  . Years of Education: college 1   Occupational History  . employed in the tobacco industry    .     Social History Main Topics  . Smoking status: Former Smoker -- 1.50 packs/day for 40 years  Types: Cigarettes    Quit date: 06/21/2006  . Smokeless tobacco: Never Used  . Alcohol Use: No  . Drug Use: No  . Sexual Activity: No   Other Topics Concern  . Not on file   Social History Narrative     PHYSICAL EXAM  Filed Vitals:   04/21/15 1022  BP: 132/72  Pulse: 101  Height: 6\' 1"  (1.854 m)  Weight: 208 lb 3.2 oz (94.439 kg)   Body mass index is 27.47 kg/(m^2). General: The patient is alert and cooperative at the time of the examination. Skin: No significant peripheral edema is noted.  Neurologic Exam Mental status: The Mini-Mental status examination done today was 26/30.AFT 9. Clock drawing 4/4 Cranial nerves: Facial symmetry is present. Speech is normal, no aphasia or dysarthria is noted. Extraocular movements are full. Visual fields are full. Motor: The patient has good strength in all 4 extremities. No focal weakness Sensory examination: Soft touch sensation is symmetric on the face, arms, and legs. Coordination: The patient has good finger-nose-finger and heel-to-shin bilaterally. Gait and station: The patient has a wide-based, unsteady gait. The patient uses a walker for ambulation. Tandem gait was not attempted. Romberg is negative. No drift is seen. Reflexes: Deep tendon reflexes are symmetric upper and lower.     DIAGNOSTIC  DATA (LABS, IMAGING, TESTING) - I reviewed patient records, labs, notes, testing and imaging myself where available.  Lab Results  Component Value Date   WBC 6.3 03/13/2015   HGB 11.6* 03/13/2015   HCT 37.2 03/13/2015   MCV 83.2 03/13/2015   PLT 279 03/13/2015      Component Value Date/Time   NA 144 03/13/2015 1137   K 4.1 03/13/2015 1137   CL 96 03/13/2015 1137   CO2 34* 03/13/2015 1137   GLUCOSE 98 03/13/2015 1137   BUN 21 03/13/2015 1137   CREATININE 0.99 03/13/2015 1137   CREATININE 1.01 06/22/2014 2237   CALCIUM 9.5 03/13/2015 1137   PROT 6.8 03/13/2015 1137   ALBUMIN 4.3 03/13/2015 1137   AST 17 03/13/2015 1137   ALT 15 03/13/2015 1137   ALKPHOS 77 03/13/2015 1137   BILITOT 0.5 03/13/2015 1137   GFRNONAA 54* 06/22/2014 2237   GFRAA 63* 06/22/2014 2237   Lab Results  Component Value Date   CHOL 181 03/13/2015   HDL 61 03/13/2015   LDLCALC 90 03/13/2015   TRIG 150* 03/13/2015   CHOLHDL 3.0 03/13/2015       ASSESSMENT AND PLAN  74 y.o. year old female  has a past medical history of 1. Memory loss mild 2. Cerebrovascular disease 3. Gait disturbance  Memory score is stable 26 out of 30 Continue Aricept 5 mg ordered by Dr. Moshe Cipro Continue to use rolling walker at all times for safe ambulation Reviewed with daughter and patient MRI from June 2015 as well as CT of the head in August which shows age-appropriate atrophy. Questions answered.  Follow-up in 6-8 months Dennie Bible, [facility], [facility], APRN  [facility] [address], [place], [place] [address] [phone]

## 2015-04-21 NOTE — Patient Instructions (Signed)
Memory score is stable 26 out of 30 Continue Aricept 5 mg ordered by Dr. Moshe Cipro Follow-up in 6-8 months

## 2015-05-13 DIAGNOSIS — J449 Chronic obstructive pulmonary disease, unspecified: Secondary | ICD-10-CM | POA: Diagnosis not present

## 2015-05-20 DIAGNOSIS — J449 Chronic obstructive pulmonary disease, unspecified: Secondary | ICD-10-CM | POA: Diagnosis not present

## 2015-05-23 ENCOUNTER — Other Ambulatory Visit: Payer: Self-pay | Admitting: Family Medicine

## 2015-05-23 DIAGNOSIS — Z1231 Encounter for screening mammogram for malignant neoplasm of breast: Secondary | ICD-10-CM

## 2015-05-26 DIAGNOSIS — J449 Chronic obstructive pulmonary disease, unspecified: Secondary | ICD-10-CM | POA: Diagnosis not present

## 2015-06-06 DIAGNOSIS — M40209 Unspecified kyphosis, site unspecified: Secondary | ICD-10-CM | POA: Diagnosis not present

## 2015-06-06 DIAGNOSIS — E785 Hyperlipidemia, unspecified: Secondary | ICD-10-CM | POA: Diagnosis not present

## 2015-06-06 DIAGNOSIS — J449 Chronic obstructive pulmonary disease, unspecified: Secondary | ICD-10-CM | POA: Diagnosis not present

## 2015-06-06 DIAGNOSIS — I10 Essential (primary) hypertension: Secondary | ICD-10-CM | POA: Diagnosis not present

## 2015-06-06 DIAGNOSIS — H269 Unspecified cataract: Secondary | ICD-10-CM | POA: Diagnosis not present

## 2015-06-06 DIAGNOSIS — E669 Obesity, unspecified: Secondary | ICD-10-CM | POA: Diagnosis not present

## 2015-06-06 DIAGNOSIS — H547 Unspecified visual loss: Secondary | ICD-10-CM | POA: Diagnosis not present

## 2015-06-06 DIAGNOSIS — F330 Major depressive disorder, recurrent, mild: Secondary | ICD-10-CM | POA: Diagnosis not present

## 2015-06-06 DIAGNOSIS — K219 Gastro-esophageal reflux disease without esophagitis: Secondary | ICD-10-CM | POA: Diagnosis not present

## 2015-06-12 ENCOUNTER — Ambulatory Visit (HOSPITAL_COMMUNITY)
Admission: RE | Admit: 2015-06-12 | Discharge: 2015-06-12 | Disposition: A | Discharge status: Home / Self Care | Payer: Medicare PPO | Source: Ambulatory Visit | Attending: Family Medicine | Admitting: Family Medicine

## 2015-06-12 ENCOUNTER — Other Ambulatory Visit: Payer: Self-pay | Admitting: Family Medicine

## 2015-06-12 DIAGNOSIS — Z1231 Encounter for screening mammogram for malignant neoplasm of breast: Secondary | ICD-10-CM | POA: Diagnosis not present

## 2015-06-12 DIAGNOSIS — J449 Chronic obstructive pulmonary disease, unspecified: Secondary | ICD-10-CM | POA: Diagnosis not present

## 2015-06-20 DIAGNOSIS — J449 Chronic obstructive pulmonary disease, unspecified: Secondary | ICD-10-CM | POA: Diagnosis not present

## 2015-07-13 DIAGNOSIS — J449 Chronic obstructive pulmonary disease, unspecified: Secondary | ICD-10-CM | POA: Diagnosis not present

## 2015-07-21 DIAGNOSIS — J449 Chronic obstructive pulmonary disease, unspecified: Secondary | ICD-10-CM | POA: Diagnosis not present

## 2015-08-13 ENCOUNTER — Ambulatory Visit (INDEPENDENT_AMBULATORY_CARE_PROVIDER_SITE_OTHER): Payer: Medicare PPO | Admitting: Family Medicine

## 2015-08-13 ENCOUNTER — Encounter: Payer: Self-pay | Admitting: Family Medicine

## 2015-08-13 VITALS — BP 130/80 | HR 88 | Resp 18 | Ht 73.0 in | Wt 252.1 lb

## 2015-08-13 DIAGNOSIS — Z23 Encounter for immunization: Secondary | ICD-10-CM

## 2015-08-13 DIAGNOSIS — I10 Essential (primary) hypertension: Secondary | ICD-10-CM

## 2015-08-13 DIAGNOSIS — Z0000 Encounter for general adult medical examination without abnormal findings: Secondary | ICD-10-CM

## 2015-08-13 DIAGNOSIS — R296 Repeated falls: Secondary | ICD-10-CM

## 2015-08-13 DIAGNOSIS — R413 Other amnesia: Secondary | ICD-10-CM

## 2015-08-13 DIAGNOSIS — J449 Chronic obstructive pulmonary disease, unspecified: Secondary | ICD-10-CM | POA: Diagnosis not present

## 2015-08-13 DIAGNOSIS — E785 Hyperlipidemia, unspecified: Secondary | ICD-10-CM

## 2015-08-13 MED ORDER — DONEPEZIL HCL 10 MG PO TABS: 10.0000 mg | ORAL_TABLET | Freq: Every day | ORAL | Status: DC

## 2015-08-13 NOTE — Progress Notes (Signed)
Subjective:    Patient ID: [name], female    DOB: 14-Aug-1941, 74 y.o.   MRN: [id number]  HPI Preventive Screening-Counseling & Management   Patient present here today for a Medicare annual wellness visit.   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 No  Over the past two weeks, have you felt little interest or pleasure in doing things? No  Have you lost interest or pleasure in daily life? No  Do you often feel hopeless? No  Do you cry easily over simple problems? No   Activities of Daily Living  In your present state of health, do you have any difficulty performing the following activities?  Driving?: Yes;  Transported by family Managing money?: Yes due to dementia; finances handled by family Feeding yourself?:No Getting from bed to chair?:No Climbing a flight of stairs?: Yes due to use of walker Preparing food and eating?:No Bathing or showering?:No Getting dressed?:No Getting to the toilet?:No Using the toilet?:No Moving around from place to place?: Yes; uses assistive device (walker)  Fall Risk Assessment In the past year have you fallen or had a near fall?: Yes Are you currently taking any medications that make you dizzy?:No   Hearing Difficulties: No Do you often ask people to speak up or repeat themselves?:No Do you experience ringing or noises in your ears?:No Do you have difficulty understanding soft or whispered  voices?:No  Cognitive Testing  Alert? Yes Normal Appearance?Yes  Oriented to person? Yes Place? Yes  Time? Intermittent Displays appropriate judgment?Yes  Can read the correct time from a watch face? yes Are you having problems remembering things? Yes; patient diagnosed with dementia no progression noted   Advanced Directives have been discussed with the patient?Yes and granddaughter states that patient has both healthcare poa and living will; requested copies of documents    List the Names of Other Physician/Practitioners you currently use: careteams updated    Indicate any recent Medical Services you may have received from other than [facility] providers in the past year (date may be approximate).   Assessment:    Annual Wellness Exam   Plan:     Medicare Attestation  I have personally reviewed:  The patient's medical and social history  Their use of alcohol, tobacco or illicit drugs  Their current medications and supplements  The patient's functional ability including ADLs,fall risks, home safety risks, cognitive, and hearing and visual impairment  Diet and physical activities  Evidence for depression or mood disorders  The patient's weight, height, BMI, and visual acuity have been recorded in the chart. I have made referrals, counseling, and provided education to the patient based on review of the above and I have provided the patient with a written personalized care plan for preventive services.     Review of Systems     Objective:   Physical Exam  BP 130/80 mmHg  Pulse 88  Resp 18  Ht 6\' 1"  (1.854 m)  Wt  252 lb 1.9 oz (114.361 kg)  BMI 33.27 kg/m2  SpO2 90%       Assessment & Plan:  Medicare annual wellness visit, subsequent Annual exam as documented. Counseling done  re healthy lifestyle involving commitment to 150 minutes exercise per week, heart healthy diet, and attaining healthy weight.The importance of adequate sleep also discussed. Regular seat belt  use and home safety, is also discussed. Changes in health habits are decided on by the patient with goals and time frames  set for achieving them. Immunization and cancer screening needs are specifically addressed at this visit.   Need for prophylactic vaccination and inoculation against influenza After obtaining informed consent, the vaccine is  administered by LPN.   Memory difficulty Dose increase to standard treating dose of aricept  Falls frequently Home safety and fall risk reduction discussed and written info provided

## 2015-08-13 NOTE — Assessment & Plan Note (Signed)
After obtaining informed consent, the vaccine is  administered by LPN.  

## 2015-08-13 NOTE — Assessment & Plan Note (Signed)

## 2015-08-13 NOTE — Patient Instructions (Addendum)
F/u in 4.5 month, call if you need me before  Flu vaccine today  Dose increase in aricept to standard treating dose of 10 mg daily  Call for script  for walker once you know where you want to get it  Please  Fasting labs needed please get between mid October  Mid  December  Thanks for choosing [facility], we consider it a privelige to serve you.   All the best!  Keep reading  Fall Prevention and [place] Falls cause injuries and can affect all age groups. It is possible to prevent falls.  HOW TO PREVENT FALLS  Wear shoes with rubber soles that do not have an opening for your toes.  Keep the inside and outside of your house well lit.  Use night lights throughout your home.  Remove clutter from floors.  Clean up floor spills.  Remove throw rugs or fasten them to the floor with carpet tape.  Do not place electrical cords across pathways.  Put grab bars by your tub, shower, and toilet. Do not use towel bars as grab bars.  Put handrails on both sides of the stairway. Fix loose handrails.  Do not climb on stools or stepladders, if possible.  Do not wax your floors.  Repair uneven or unsafe sidewalks, walkways, or stairs.  Keep items you use a lot within reach.  Be aware of pets.  Keep emergency numbers next to the telephone.  Put smoke detectors in your home and near bedrooms. Ask your doctor what other things you can do to prevent falls. Document Released: 08/28/2009 Document Revised: 05/02/2012 Document Reviewed: 02/01/2012 [facility] Patient Information 2015 [place], [place]. This information is not intended to replace advice given to you by your health care provider. Make sure you discuss any questions you have with your health care provider.

## 2015-08-15 NOTE — Assessment & Plan Note (Signed)
Home safety and fall risk reduction discussed and written info provided

## 2015-08-15 NOTE — Assessment & Plan Note (Signed)
Dose increase to standard treating dose of aricept

## 2015-08-20 DIAGNOSIS — J449 Chronic obstructive pulmonary disease, unspecified: Secondary | ICD-10-CM | POA: Diagnosis not present

## 2015-09-09 ENCOUNTER — Other Ambulatory Visit: Payer: Self-pay | Admitting: Family Medicine

## 2015-09-11 DIAGNOSIS — J449 Chronic obstructive pulmonary disease, unspecified: Secondary | ICD-10-CM | POA: Diagnosis not present

## 2015-09-12 DIAGNOSIS — J449 Chronic obstructive pulmonary disease, unspecified: Secondary | ICD-10-CM | POA: Diagnosis not present

## 2015-09-20 DIAGNOSIS — J449 Chronic obstructive pulmonary disease, unspecified: Secondary | ICD-10-CM | POA: Diagnosis not present

## 2015-10-13 DIAGNOSIS — J449 Chronic obstructive pulmonary disease, unspecified: Secondary | ICD-10-CM | POA: Diagnosis not present

## 2015-10-14 ENCOUNTER — Other Ambulatory Visit: Payer: Self-pay | Admitting: Family Medicine

## 2015-10-20 DIAGNOSIS — J449 Chronic obstructive pulmonary disease, unspecified: Secondary | ICD-10-CM | POA: Diagnosis not present

## 2015-10-21 ENCOUNTER — Ambulatory Visit (INDEPENDENT_AMBULATORY_CARE_PROVIDER_SITE_OTHER): Payer: Medicare PPO | Admitting: Nurse Practitioner

## 2015-10-21 ENCOUNTER — Encounter: Payer: Self-pay | Admitting: Nurse Practitioner

## 2015-10-21 VITALS — BP 140/65 | HR 84 | Ht 73.0 in | Wt 243.0 lb

## 2015-10-21 DIAGNOSIS — R413 Other amnesia: Secondary | ICD-10-CM | POA: Diagnosis not present

## 2015-10-21 DIAGNOSIS — R269 Unspecified abnormalities of gait and mobility: Secondary | ICD-10-CM | POA: Diagnosis not present

## 2015-10-21 NOTE — Progress Notes (Signed)
[facility]  PATIENT: [name] DOB: 07-27-1941   REASON FOR VISIT: Follow-up for memory loss HISTORY FROM: Patient and daughter    HISTORY OF PRESENT ILLNESS:[name], 74 year old black female returns for follow-up. She was last seen 04/21/15.She has a history of sudden change in her memory in May 2015. She had a bladder infection around that time. She also has a history of cerebrovascular disease but MRI done in May 2015 did not show anything acute. She is O2 dependent for chronic obstructive pulmonary disease. She has been placed on Aricept by her primary care Dr. Moshe Cipro and is currently taking 10 mg daily.She lives alone but her daughters check on her frequently. One of her daughters lives across the street. She is independent in ADLs. She no longer cooks. She no longer drives. She uses a rolling walker.  2 falls in the last 6 months without injury .  She has [facility]. She returns for reevaluation   HISTORY: [name] is a 74 year old right-handed black female with a history of a memory disorder. According to the family, the patient had a sudden change in her memory and cognitive issues that occurred in May 2015. Since that time, her cognitive status has been quite stable. The patient has never gotten back to her baseline. The patient had a bladder infection around the onset of her cognitive deficits. MRI of the brain did not show an acute stroke, but patient does have a history of cerebrovascular disease with bilateral medullary strokes. The patient has undergone blood work that shows a mild elevation in ammonia level, and the PCO2 was 55, but this is at or near her baseline. The patient requires assistance to keep up with medications now, but she otherwise lives alone, able to bathe and dress her self, and she does some cooking. She has had occasional falls, she uses a walker for ambulation. 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      REVIEW OF SYSTEMS: Full 14 system review of systems performed and notable only for those listed, all others are neg:  Constitutional: Appetite change Cardiovascular: neg Ear/Nose/Throat: neg  Skin: neg Eyes: neg Respiratory: Oxygen dependent Gastroitestinal: neg  Hematology/Lymphatic: neg  Endocrine: neg Musculoskeletal: Walking difficulty Allergy/Immunology: neg Neurological: Memory loss Psychiatric: neg Sleep : neg   ALLERGIES: Allergies  Allergen Reactions  . Fenofibrate Nausea And Vomiting  . Pravastatin Sodium Nausea And Vomiting  . Sulfonamide Derivatives Other (See Comments)    unknown  . Ciprofloxacin Rash    HOME MEDICATIONS: Outpatient Prescriptions Prior to Visit  Medication Sig Dispense Refill  . acetaminophen (TYLENOL) 325 MG tablet Take 325 mg by mouth 2 (two) times daily.     [name] alendronate (FOSAMAX) 70 MG tablet Take 1 tablet (70 mg total) by mouth once a week. Take with a full glass of water on an empty stomach. 12 tablet 1  . arformoterol (BROVANA) 15 MCG/2ML NEBU Take 2 mLs (15 mcg total) by nebulization 2 (two) times daily. 360 mL 1  . aspirin EC 81 MG tablet Take 1 tablet (81 mg total) by mouth daily. 100 tablet 3  . atorvastatin (LIPITOR) 10 MG tablet TAKE 1 TABLET EVERY DAY 90 tablet 1  . calcium-vitamin D (OSCAL WITH D) 500-200 MG-UNIT per tablet Take 1 tablet by mouth daily.     [name] donepezil (ARICEPT) 10 MG tablet Take 1 tablet (10 mg total) by mouth at bedtime. 90 tablet 3  . FLUoxetine (  PROZAC) 40 MG capsule TAKE 1 CAPSULE EVERY DAY 90 capsule 1  . Multiple Vitamins-Minerals (CEROVITE SENIOR) TABS Take 1 tablet by mouth daily.     [name] omeprazole (PRILOSEC) 40 MG capsule TAKE 1 CAPSULE DAILY 90 capsule 1  . PROAIR RESPICLICK 123XX123 (90 BASE) MCG/ACT AEPB as needed.    . verapamil (CALAN-SR) 240 MG CR tablet TAKE 1 TABLET AT BEDTIME 90 tablet 1   No facility-administered medications prior to visit.    PAST MEDICAL HISTORY: Past Medical History   Diagnosis Date  . Allergic rhinitis   . Anemia   . Anxiety   . Depression   . GERD (gastroesophageal reflux disease)   . Hypertension   . Low back pain   . Arthritis     abnormal gait   . OSA on CPAP   . Degenerative disc disease, lumbar     L5 nerve impingement   . 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 Arm fracture, left   . Onychomycosis   . Ankle fracture, right   . Hyperglycemia   . Mediastinal lymphadenopathy 1/9    resolving   . COPD (chronic obstructive pulmonary disease) (HCC)     chronic CO2 retention, decreased DLCO   . Cystic acne     adult   . Sleep apnea     stop bang score 6  . History of colon surgery     right hemicolectomy for high-grade adenomas in right colon 2013  . Abnormality of gait 06/06/2014  . Memory difficulty 09/20/2014    PAST SURGICAL HISTORY: Past Surgical History  Procedure Laterality Date  . Abdominal hysterectomy  1976    secondary to bleeding   . Oophorectomy  1976  . Epidural steroids    . Orif ankle fracture  01/18/2012    Procedure: OPEN REDUCTION INTERNAL FIXATION (ORIF) ANKLE FRACTURE;  Surgeon: Arther Abbott, MD;  Location: [facility];  Service: Orthopedics;  Laterality: Left;  . Colonoscopy  07/20/2012    Dr. Gala Romney: multiple colonic polyps with large polyps on right side, s/p saline-assisted debulking piecemeal polypectomy and ablation. Not all removed. Path with tubulovillous adenomas, high grade dysplasia.   [name] Appendectomy    . Colon resection  08/18/2012    Procedure: HAND ASSISTED LAPAROSCOPIC COLON RESECTION;  Surgeon: Jamesetta So, MD;  Location: [facility];  Service: General;  Laterality: N/A;  . Colonoscopy N/A 03/09/2013    [id number] polyps-tubular adenomas. S/p right hemicolectomy. next tcs 02/2018  . Esophagogastroduodenoscopy (egd) with esophageal dilation N/A 02/06/2014    Procedure: ESOPHAGOGASTRODUODENOSCOPY (EGD) WITH ESOPHAGEAL DILATION;  Surgeon: Daneil Dolin, MD;  Location: [facility];   Service: Endoscopy;  Laterality: N/A;  9:30    FAMILY HISTORY: Family History  Problem Relation Age of Onset  . Stomach cancer Father   . Cancer Father   . Cancer Mother     brain   . Breast cancer Mother   . Arthritis      SOCIAL HISTORY: Social History   Social History  . Marital Status: Widowed    Spouse Name: N/A  . Number of Children: 4  . Years of Education: college 1   Occupational History  . employed in the tobacco industry    .     Social History Main Topics  . Smoking status: Former Smoker -- 1.50 packs/day for 40 years    Types: Cigarettes    Quit date: 06/21/2006  . Smokeless tobacco: Never Used  .  Alcohol Use: No  . Drug Use: No  . Sexual Activity: No   Other Topics Concern  . Not on file   Social History Narrative     PHYSICAL EXAM  Filed Vitals:   10/21/15 0945  BP: 140/65  Pulse: 84  Height: 6\' 1"  (1.854 m)  Weight: 243 lb (110.224 kg)   Body mass index is 32.07 kg/(m^2). General: The patient is alert and cooperative at the time of the examination. Skin: No significant peripheral edema is noted.  Neurologic Exam Mental status: The Mini-Mental status examination done today was 23/30.AFT 11. Clock drawing 4/4. Last 26/30.AFT 9. Clock drawing 4/4 Cranial nerves: Facial symmetry is present. Speech is normal, no aphasia or dysarthria is noted. Extraocular movements are full. Visual fields are full. Motor: The patient has good strength in all 4 extremities. No focal weakness Sensory examination: Soft touch sensation is symmetric on the face, arms, and legs. Coordination: The patient has good finger-nose-finger and heel-to-shin bilaterally. Gait and station: The patient has a wide-based, unsteady gait. The patient uses a walker for ambulation. Tandem gait was not attempted. Romberg is negative. No drift is seen. Reflexes: Deep tendon reflexes are symmetric upper and lower.   DIAGNOSTIC DATA (LABS, IMAGING, TESTING) - I reviewed patient  records, labs, notes, testing and imaging myself where available.  Lab Results  Component Value Date   WBC 6.3 03/13/2015   HGB 11.6* 03/13/2015   HCT 37.2 03/13/2015   MCV 83.2 03/13/2015   PLT 279 03/13/2015      Component Value Date/Time   NA 144 03/13/2015 1137   K 4.1 03/13/2015 1137   CL 96 03/13/2015 1137   CO2 34* 03/13/2015 1137   GLUCOSE 98 03/13/2015 1137   BUN 21 03/13/2015 1137   CREATININE 0.99 03/13/2015 1137   CREATININE 1.01 06/22/2014 2237   CALCIUM 9.5 03/13/2015 1137   PROT 6.8 03/13/2015 1137   ALBUMIN 4.3 03/13/2015 1137   AST 17 03/13/2015 1137   ALT 15 03/13/2015 1137   ALKPHOS 77 03/13/2015 1137   BILITOT 0.5 03/13/2015 1137   GFRNONAA 54* 06/22/2014 2237   GFRAA 63* 06/22/2014 2237   Lab Results  Component Value Date   CHOL 181 03/13/2015   HDL 61 03/13/2015   LDLCALC 90 03/13/2015   TRIG 150* 03/13/2015   CHOLHDL 3.0 03/13/2015    ASSESSMENT AND PLAN 74 y.o. year old female has a past medical history of 1. Memory loss  2. Cerebrovascular disease 3. Gait disturbance  Memory score is stable 23 out of 30 Continue Aricept 10 mg ordered by Dr. Moshe Cipro Continue daily baby aspirin Continue to use rolling walker at all times for safe ambulation Exercise memory by crosswords word search and other strategy games.  F/U in 6 months with Dr. Jannifer Franklin Vst time 66 min Dennie Bible, [facility], [facility], [facility]  [facility] [address] [place], [place] [address] [phone]

## 2015-10-21 NOTE — Patient Instructions (Signed)
Memory score is stable 23 out of 30 Continue Aricept 10 mg ordered by Dr. Moshe Cipro Continue to use rolling walker at all times for safe ambulation Exercise memory by crosswords word search and other strategy games.  F/U in 6 months with Dr. Jannifer Franklin

## 2015-10-21 NOTE — Progress Notes (Signed)
I have read the note, and I agree with the clinical assessment and plan.  WILLIS,CHARLES KEITH   

## 2015-11-06 ENCOUNTER — Other Ambulatory Visit: Payer: Self-pay | Admitting: Family Medicine

## 2015-11-06 DIAGNOSIS — E785 Hyperlipidemia, unspecified: Secondary | ICD-10-CM | POA: Diagnosis not present

## 2015-11-06 DIAGNOSIS — D539 Nutritional anemia, unspecified: Secondary | ICD-10-CM | POA: Diagnosis not present

## 2015-11-06 DIAGNOSIS — I10 Essential (primary) hypertension: Secondary | ICD-10-CM | POA: Diagnosis not present

## 2015-11-06 LAB — CBC WITH DIFFERENTIAL/PLATELET
Basophils Absolute: 0 10*3/uL (ref 0.0–0.1)
Basophils Relative: 0 % (ref 0–1)
Eosinophils Absolute: 0.3 10*3/uL (ref 0.0–0.7)
Eosinophils Relative: 5 % (ref 0–5)
HEMATOCRIT: 36.5 % (ref 36.0–46.0)
Hemoglobin: 11.1 g/dL — ABNORMAL LOW (ref 12.0–15.0)
LYMPHS ABS: 1.5 10*3/uL (ref 0.7–4.0)
LYMPHS PCT: 26 % (ref 12–46)
MCH: 25.5 pg — ABNORMAL LOW (ref 26.0–34.0)
MCHC: 30.4 g/dL (ref 30.0–36.0)
MCV: 83.9 fL (ref 78.0–100.0)
MONO ABS: 0.4 10*3/uL (ref 0.1–1.0)
MPV: 9.1 fL (ref 8.6–12.4)
Monocytes Relative: 6 % (ref 3–12)
NEUTROS ABS: 3.7 10*3/uL (ref 1.7–7.7)
Neutrophils Relative %: 63 % (ref 43–77)
Platelets: 286 10*3/uL (ref 150–400)
RBC: 4.35 MIL/uL (ref 3.87–5.11)
RDW: 15.5 % (ref 11.5–15.5)
WBC: 5.9 10*3/uL (ref 4.0–10.5)

## 2015-11-07 LAB — LIPID PANEL
Cholesterol: 159 mg/dL (ref 125–200)
HDL: 71 mg/dL (ref >=46)
LDL Cholesterol: 57 mg/dL (ref <130)
TRIGLYCERIDES: 153 mg/dL — AB (ref <150)
Total CHOL/HDL Ratio: 2.2 Ratio (ref <=5.0)
VLDL: 31 mg/dL — AB (ref <30)

## 2015-11-07 LAB — COMPREHENSIVE METABOLIC PANEL
ALK PHOS: 85 U/L (ref 33–130)
ALT: 19 U/L (ref 6–29)
AST: 20 U/L (ref 10–35)
Albumin: 3.8 g/dL (ref 3.6–5.1)
BUN: 12 mg/dL (ref 7–25)
CO2: 32 mmol/L — AB (ref 20–31)
Calcium: 9.2 mg/dL (ref 8.6–10.4)
Chloride: 102 mmol/L (ref 98–110)
Creat: 0.8 mg/dL (ref 0.60–0.93)
GLUCOSE: 84 mg/dL (ref 65–99)
Potassium: 4.2 mmol/L (ref 3.5–5.3)
Sodium: 141 mmol/L (ref 135–146)
Total Bilirubin: 0.4 mg/dL (ref 0.2–1.2)
Total Protein: 6.4 g/dL (ref 6.1–8.1)

## 2015-11-07 LAB — TSH: TSH: 1.028 u[IU]/mL (ref 0.350–4.500)

## 2015-11-10 ENCOUNTER — Other Ambulatory Visit: Payer: Self-pay | Admitting: Family Medicine

## 2015-11-11 ENCOUNTER — Telehealth: Payer: Self-pay | Admitting: Family Medicine

## 2015-11-11 ENCOUNTER — Other Ambulatory Visit: Payer: Self-pay | Admitting: Family Medicine

## 2015-11-11 DIAGNOSIS — J449 Chronic obstructive pulmonary disease, unspecified: Secondary | ICD-10-CM | POA: Diagnosis not present

## 2015-11-11 LAB — FERRITIN: FERRITIN: 46 ng/mL (ref 10–291)

## 2015-11-11 LAB — IRON: IRON: 95 ug/dL (ref 45–160)

## 2015-11-11 MED ORDER — ALBUTEROL SULFATE 108 (90 BASE) MCG/ACT IN AEPB: 2.0000 | INHALATION_SPRAY | Freq: Three times a day (TID) | RESPIRATORY_TRACT | Status: DC | PRN

## 2015-11-11 NOTE — Telephone Encounter (Signed)
Med sent in.

## 2015-11-11 NOTE — Telephone Encounter (Signed)
Printed, please fax.  

## 2015-11-11 NOTE — Telephone Encounter (Signed)
Med refilled.

## 2015-11-11 NOTE — Telephone Encounter (Signed)
i see this on her current med list but there are no directions . How should she be taking it and ok to refill?

## 2015-11-11 NOTE — Telephone Encounter (Signed)
Patient is calling requesting a refill on PROAIR RESPICLICK [id number] (90 BASE) MCG/ACT AEPB  She broke hers over the weekend

## 2015-11-12 DIAGNOSIS — J449 Chronic obstructive pulmonary disease, unspecified: Secondary | ICD-10-CM | POA: Diagnosis not present

## 2015-11-20 DIAGNOSIS — J449 Chronic obstructive pulmonary disease, unspecified: Secondary | ICD-10-CM | POA: Diagnosis not present

## 2015-12-11 DIAGNOSIS — J449 Chronic obstructive pulmonary disease, unspecified: Secondary | ICD-10-CM | POA: Diagnosis not present

## 2015-12-11 DIAGNOSIS — H547 Unspecified visual loss: Secondary | ICD-10-CM | POA: Diagnosis not present

## 2015-12-11 DIAGNOSIS — I10 Essential (primary) hypertension: Secondary | ICD-10-CM | POA: Diagnosis not present

## 2015-12-11 DIAGNOSIS — M170 Bilateral primary osteoarthritis of knee: Secondary | ICD-10-CM | POA: Diagnosis not present

## 2015-12-11 DIAGNOSIS — E785 Hyperlipidemia, unspecified: Secondary | ICD-10-CM | POA: Diagnosis not present

## 2015-12-11 DIAGNOSIS — F3340 Major depressive disorder, recurrent, in remission, unspecified: Secondary | ICD-10-CM | POA: Diagnosis not present

## 2015-12-11 DIAGNOSIS — M810 Age-related osteoporosis without current pathological fracture: Secondary | ICD-10-CM | POA: Diagnosis not present

## 2015-12-11 DIAGNOSIS — E669 Obesity, unspecified: Secondary | ICD-10-CM | POA: Diagnosis not present

## 2015-12-11 DIAGNOSIS — K219 Gastro-esophageal reflux disease without esophagitis: Secondary | ICD-10-CM | POA: Diagnosis not present

## 2015-12-13 DIAGNOSIS — J449 Chronic obstructive pulmonary disease, unspecified: Secondary | ICD-10-CM | POA: Diagnosis not present

## 2015-12-15 ENCOUNTER — Encounter: Payer: Self-pay | Admitting: Family Medicine

## 2015-12-15 ENCOUNTER — Ambulatory Visit (INDEPENDENT_AMBULATORY_CARE_PROVIDER_SITE_OTHER): Payer: Medicare PPO | Admitting: Family Medicine

## 2015-12-15 VITALS — BP 120/70 | HR 100 | Resp 18 | Ht 73.0 in | Wt 241.0 lb

## 2015-12-15 DIAGNOSIS — R296 Repeated falls: Secondary | ICD-10-CM | POA: Diagnosis not present

## 2015-12-15 DIAGNOSIS — R413 Other amnesia: Secondary | ICD-10-CM

## 2015-12-15 DIAGNOSIS — J449 Chronic obstructive pulmonary disease, unspecified: Secondary | ICD-10-CM

## 2015-12-15 DIAGNOSIS — M25562 Pain in left knee: Secondary | ICD-10-CM

## 2015-12-15 DIAGNOSIS — M129 Arthropathy, unspecified: Secondary | ICD-10-CM | POA: Diagnosis not present

## 2015-12-15 DIAGNOSIS — M170 Bilateral primary osteoarthritis of knee: Secondary | ICD-10-CM

## 2015-12-15 DIAGNOSIS — I10 Essential (primary) hypertension: Secondary | ICD-10-CM

## 2015-12-15 DIAGNOSIS — M159 Polyosteoarthritis, unspecified: Secondary | ICD-10-CM

## 2015-12-15 DIAGNOSIS — M4807 Spinal stenosis, lumbosacral region: Secondary | ICD-10-CM

## 2015-12-15 MED ORDER — FLUOXETINE HCL 40 MG PO CAPS: 40.0000 mg | ORAL_CAPSULE | Freq: Every day | ORAL | Status: DC

## 2015-12-15 MED ORDER — ATORVASTATIN CALCIUM 10 MG PO TABS: 10.0000 mg | ORAL_TABLET | Freq: Every day | ORAL | Status: DC

## 2015-12-15 MED ORDER — VERAPAMIL HCL ER 240 MG PO TBCR: 240.0000 mg | EXTENDED_RELEASE_TABLET | Freq: Every day | ORAL | Status: DC

## 2015-12-15 MED ORDER — OMEPRAZOLE 40 MG PO CPDR
40.0000 mg | DELAYED_RELEASE_CAPSULE | Freq: Every day | ORAL | Status: DC
Start: 2015-12-15 — End: 2016-04-29

## 2015-12-15 MED ORDER — DONEPEZIL HCL 10 MG PO TABS: 10.0000 mg | ORAL_TABLET | Freq: Every day | ORAL | Status: DC

## 2015-12-15 MED ORDER — PREDNISONE 5 MG PO TABS: 5.0000 mg | ORAL_TABLET | Freq: Two times a day (BID) | ORAL | Status: DC

## 2015-12-15 MED ORDER — ALENDRONATE SODIUM 70 MG PO TABS: 70.0000 mg | ORAL_TABLET | ORAL | Status: DC

## 2015-12-15 MED ORDER — METHYLPREDNISOLONE ACETATE 80 MG/ML IJ SUSP
80.0000 mg | Freq: Once | INTRAMUSCULAR | Status: AC
  Administered 2015-12-15: 80 mg via INTRAMUSCULAR

## 2015-12-15 MED ORDER — KETOROLAC TROMETHAMINE 60 MG/2ML IM SOLN
60.0000 mg | Freq: Once | INTRAMUSCULAR | Status: AC
  Administered 2015-12-15: 60 mg via INTRAMUSCULAR

## 2015-12-15 NOTE — Progress Notes (Signed)
   Subjective:    Patient ID: [name], female    DOB: December 04, 1940, 75 y.o.   MRN: [id number]  HPI    Review of Systems     Objective:   Physical Exam        Assessment & Plan:    [name]     MRN: [id number]      DOB: 1941/08/09   HPI [name] is here for follow up and re-evaluation of chronic medical conditions, medication management and review of any available recent lab and radiology data.  Preventive health is updated, specifically  Cancer screening and Immunization.   Questions or concerns regarding consultations or procedures which the PT has had in the interim are  addressed. The PT denies any adverse reactions to current medications since the last visit.  C/o increased knee pain and instability, right greater than left, contributing to falls, she has had 3 in this current year , requests home PT for strengthening and gait training to reduce risk of recurrent fall  ROS Denies recent fever or chills. Denies sinus pressure, nasal congestion, ear pain or sore throat. Denies chest congestion, productive cough or wheezing. Denies chest pains, palpitations and leg swelling Denies abdominal pain, nausea, vomiting,diarrhea or constipation.   Denies dysuria, frequency, hesitancy  . Denies headaches, seizures, numbness, or tingling. Denies depression, anxiety or insomnia. Denies skin break down or rash.   PE  BP 120/70 mmHg  Pulse 100  Resp 18  Ht 6\' 1"  (1.854 m)  Wt 241 lb (109.317 kg)  BMI 31.80 kg/m2  SpO2 90%  Patient alert and oriented and in no cardiopulmonary distress.  HEENT: No facial asymmetry, EOMI,   oropharynx pink and moist.  Neck supple no JVD, no mass.  Chest: Clear to auscultation bilaterally.decreased though adequate air entry  CVS: S1, S2 no murmurs, no S3.Regular rate.  ABD: Soft non tender.   Ext: No edema  MS: decreased  ROM spine, shoulders, hips and knees.  Skin: Intact, no ulcerations or rash  noted.  Psych: Good eye contact, normal affect. Memory intact not anxious or depressed appearing.  CNS: CN 2-12 intact, power,  normal throughout.no focal deficits noted.   Assessment & Plan  Falls frequently Increased falls, uncontrolled knee pain, c/o instability, right worse than left, ortho eval for knees and ophysical therapy for fall risk reduction through strengthening exercises and gait training in the home, she is home bound  Needs twice weekly physical therapy for 6 weeks  Spinal stenosis Chronic back and lower extremity pain and weakness, multiple falls in past 1 month  Home physical therapy needed  Essential hypertension Controlled, no change in medication   COPD (chronic obstructive pulmonary disease) Controlled, no change in medication Remains oxygen dependent  Generalized osteoarthritis of multiple sites Flare of pain in knees injections in office and 5 day course of prednisone

## 2015-12-15 NOTE — Assessment & Plan Note (Addendum)
Increased falls, uncontrolled knee pain, c/o instability, right worse than left, ortho eval for knees and ophysical therapy for fall risk reduction through strengthening exercises and gait training in the home, she is home bound  Needs twice weekly physical therapy for 6 weeks

## 2015-12-15 NOTE — Patient Instructions (Addendum)
Annual physical exam in 4.5 months, call if you need me sooner  Injections in office today for knee pain, 5 day course of prednisone sent  you are also referred to physical therapy at home and to Dr Aline Brochure due to instability and pain in knees esp the right   Concerned about falls, please make sure home is clutter free  Fall Prevention in the Home  Falls can cause injuries. They can happen to people of all ages. There are many things you can do to make your home safe and to help prevent falls.  WHAT CAN I DO ON THE OUTSIDE OF MY HOME?  Regularly fix the edges of walkways and driveways and fix any cracks.  Remove anything that might make you trip as you walk through a door, such as a raised step or threshold.  Trim any bushes or trees on the path to your home.  Use bright outdoor lighting.  Clear any walking paths of anything that might make someone trip, such as rocks or tools.  Regularly check to see if handrails are loose or broken. Make sure that both sides of any steps have handrails.  Any raised decks and porches should have guardrails on the edges.  Have any leaves, snow, or ice cleared regularly.  Use sand or salt on walking paths during winter.  Clean up any spills in your garage right away. This includes oil or grease spills. WHAT CAN I DO IN THE BATHROOM?   Use night lights.  Install grab bars by the toilet and in the tub and shower. Do not use towel bars as grab bars.  Use non-skid mats or decals in the tub or shower.  If you need to sit down in the shower, use a plastic, non-slip stool.  Keep the floor dry. Clean up any water that spills on the floor as soon as it happens.  Remove soap buildup in the tub or shower regularly.  Attach bath mats securely with double-sided non-slip rug tape.  Do not have throw rugs and other things on the floor that can make you trip. WHAT CAN I DO IN THE BEDROOM?  Use night lights.  Make sure that you have a light by your  bed that is easy to reach.  Do not use any sheets or blankets that are too big for your bed. They should not hang down onto the floor.  Have a firm chair that has side arms. You can use this for support while you get dressed.  Do not have throw rugs and other things on the floor that can make you trip. WHAT CAN I DO IN THE KITCHEN?  Clean up any spills right away.  Avoid walking on wet floors.  Keep items that you use a lot in easy-to-reach places.  If you need to reach something above you, use a strong step stool that has a grab bar.  Keep electrical cords out of the way.  Do not use floor polish or wax that makes floors slippery. If you must use wax, use non-skid floor wax.  Do not have throw rugs and other things on the floor that can make you trip. WHAT CAN I DO WITH MY STAIRS?  Do not leave any items on the stairs.  Make sure that there are handrails on both sides of the stairs and use them. Fix handrails that are broken or loose. Make sure that handrails are as long as the stairways.  Check any carpeting to make sure that  it is firmly attached to the stairs. Fix any carpet that is loose or worn.  Avoid having throw rugs at the top or bottom of the stairs. If you do have throw rugs, attach them to the floor with carpet tape.  Make sure that you have a light switch at the top of the stairs and the bottom of the stairs. If you do not have them, ask someone to add them for you. WHAT ELSE CAN I DO TO HELP PREVENT FALLS?  Wear shoes that:  Do not have high heels.  Have rubber bottoms.  Are comfortable and fit you well.  Are closed at the toe. Do not wear sandals.  If you use a stepladder:  Make sure that it is fully opened. Do not climb a closed stepladder.  Make sure that both sides of the stepladder are locked into place.  Ask someone to hold it for you, if possible.  Clearly mark and make sure that you can see:  Any grab bars or handrails.  First and last  steps.  Where the edge of each step is.  Use tools that help you move around (mobility aids) if they are needed. These include:  Canes.  Walkers.  Scooters.  Crutches.  Turn on the lights when you go into a dark area. Replace any light bulbs as soon as they burn out.  Set up your furniture so you have a clear path. Avoid moving your furniture around.  If any of your floors are uneven, fix them.  If there are any pets around you, be aware of where they are.  Review your medicines with your doctor. Some medicines can make you feel dizzy. This can increase your chance of falling. Ask your doctor what other things that you can do to help prevent falls.   This information is not intended to replace advice given to you by your health care provider. Make sure you discuss any questions you have with your health care provider.   Document Released: 08/28/2009 Document Revised: 03/18/2015 Document Reviewed: 12/06/2014 Elsevier Interactive Patient Education [employer].

## 2015-12-16 NOTE — Assessment & Plan Note (Signed)
Chronic back and lower extremity pain and weakness, multiple falls in past 1 month  Home physical therapy needed

## 2015-12-16 NOTE — Assessment & Plan Note (Signed)
Controlled, no change in medication Remains oxygen dependent

## 2015-12-16 NOTE — Assessment & Plan Note (Addendum)
Flare of pain in knees injections in office and 5 day course of prednisone

## 2015-12-16 NOTE — Assessment & Plan Note (Signed)
Controlled, no change in medication  

## 2015-12-18 DIAGNOSIS — Z9981 Dependence on supplemental oxygen: Secondary | ICD-10-CM | POA: Diagnosis not present

## 2015-12-18 DIAGNOSIS — M25562 Pain in left knee: Secondary | ICD-10-CM | POA: Diagnosis not present

## 2015-12-18 DIAGNOSIS — M25561 Pain in right knee: Secondary | ICD-10-CM | POA: Diagnosis not present

## 2015-12-18 DIAGNOSIS — F068 Other specified mental disorders due to known physiological condition: Secondary | ICD-10-CM | POA: Diagnosis not present

## 2015-12-18 DIAGNOSIS — M159 Polyosteoarthritis, unspecified: Secondary | ICD-10-CM | POA: Diagnosis not present

## 2015-12-18 DIAGNOSIS — Z5181 Encounter for therapeutic drug level monitoring: Secondary | ICD-10-CM | POA: Diagnosis not present

## 2015-12-18 DIAGNOSIS — M4807 Spinal stenosis, lumbosacral region: Secondary | ICD-10-CM | POA: Diagnosis not present

## 2015-12-18 DIAGNOSIS — J449 Chronic obstructive pulmonary disease, unspecified: Secondary | ICD-10-CM | POA: Diagnosis not present

## 2015-12-18 DIAGNOSIS — I10 Essential (primary) hypertension: Secondary | ICD-10-CM | POA: Diagnosis not present

## 2015-12-20 ENCOUNTER — Emergency Department (HOSPITAL_COMMUNITY)
Admission: EM | Admit: 2015-12-20 | Discharge: 2015-12-21 | Disposition: A | Discharge status: Home / Self Care | Payer: Medicare PPO | Attending: Emergency Medicine | Admitting: Emergency Medicine

## 2015-12-20 DIAGNOSIS — Y9389 Activity, other specified: Secondary | ICD-10-CM | POA: Insufficient documentation

## 2015-12-20 DIAGNOSIS — F419 Anxiety disorder, unspecified: Secondary | ICD-10-CM | POA: Diagnosis not present

## 2015-12-20 DIAGNOSIS — Y998 Other external cause status: Secondary | ICD-10-CM | POA: Diagnosis not present

## 2015-12-20 DIAGNOSIS — S82841A Displaced bimalleolar fracture of right lower leg, initial encounter for closed fracture: Secondary | ICD-10-CM | POA: Insufficient documentation

## 2015-12-20 DIAGNOSIS — M1990 Unspecified osteoarthritis, unspecified site: Secondary | ICD-10-CM | POA: Diagnosis not present

## 2015-12-20 DIAGNOSIS — Z872 Personal history of diseases of the skin and subcutaneous tissue: Secondary | ICD-10-CM | POA: Diagnosis not present

## 2015-12-20 DIAGNOSIS — Z87891 Personal history of nicotine dependence: Secondary | ICD-10-CM | POA: Diagnosis not present

## 2015-12-20 DIAGNOSIS — F329 Major depressive disorder, single episode, unspecified: Secondary | ICD-10-CM | POA: Diagnosis not present

## 2015-12-20 DIAGNOSIS — Z862 Personal history of diseases of the blood and blood-forming organs and certain disorders involving the immune mechanism: Secondary | ICD-10-CM | POA: Diagnosis not present

## 2015-12-20 DIAGNOSIS — Z7982 Long term (current) use of aspirin: Secondary | ICD-10-CM | POA: Insufficient documentation

## 2015-12-20 DIAGNOSIS — K219 Gastro-esophageal reflux disease without esophagitis: Secondary | ICD-10-CM | POA: Insufficient documentation

## 2015-12-20 DIAGNOSIS — Z8619 Personal history of other infectious and parasitic diseases: Secondary | ICD-10-CM | POA: Diagnosis not present

## 2015-12-20 DIAGNOSIS — Z9981 Dependence on supplemental oxygen: Secondary | ICD-10-CM | POA: Diagnosis not present

## 2015-12-20 DIAGNOSIS — Z7952 Long term (current) use of systemic steroids: Secondary | ICD-10-CM | POA: Insufficient documentation

## 2015-12-20 DIAGNOSIS — S99911A Unspecified injury of right ankle, initial encounter: Secondary | ICD-10-CM | POA: Diagnosis present

## 2015-12-20 DIAGNOSIS — G4733 Obstructive sleep apnea (adult) (pediatric): Secondary | ICD-10-CM | POA: Insufficient documentation

## 2015-12-20 DIAGNOSIS — Z79899 Other long term (current) drug therapy: Secondary | ICD-10-CM | POA: Diagnosis not present

## 2015-12-20 DIAGNOSIS — I10 Essential (primary) hypertension: Secondary | ICD-10-CM | POA: Diagnosis not present

## 2015-12-20 DIAGNOSIS — W1839XA Other fall on same level, initial encounter: Secondary | ICD-10-CM | POA: Diagnosis not present

## 2015-12-20 DIAGNOSIS — J449 Chronic obstructive pulmonary disease, unspecified: Secondary | ICD-10-CM | POA: Insufficient documentation

## 2015-12-20 DIAGNOSIS — Y9289 Other specified places as the place of occurrence of the external cause: Secondary | ICD-10-CM | POA: Insufficient documentation

## 2015-12-20 DIAGNOSIS — S82831A Other fracture of upper and lower end of right fibula, initial encounter for closed fracture: Secondary | ICD-10-CM | POA: Diagnosis not present

## 2015-12-21 ENCOUNTER — Encounter (HOSPITAL_COMMUNITY): Payer: Self-pay | Admitting: *Deleted

## 2015-12-21 ENCOUNTER — Emergency Department (HOSPITAL_COMMUNITY): Payer: Medicare PPO

## 2015-12-21 DIAGNOSIS — J449 Chronic obstructive pulmonary disease, unspecified: Secondary | ICD-10-CM | POA: Diagnosis not present

## 2015-12-21 DIAGNOSIS — S82831A Other fracture of upper and lower end of right fibula, initial encounter for closed fracture: Secondary | ICD-10-CM | POA: Diagnosis not present

## 2015-12-21 MED ORDER — TRAMADOL HCL 50 MG PO TABS: 25.0000 mg | ORAL_TABLET | Freq: Four times a day (QID) | ORAL | Status: DC | PRN

## 2015-12-21 MED ORDER — WHEELCHAIR MISC: Status: DC

## 2015-12-21 NOTE — ED Provider Notes (Signed)
CSN: [id number]     Arrival date & time 12/20/15  2354 History   First MD Initiated Contact with Patient 12/20/15 2357     Chief Complaint  Patient presents with  . Ankle Injury     (Consider location/radiation/quality/duration/timing/severity/associated sxs/prior Treatment) HPI Comments: Patient presents to the ER for evaluation of right ankle injury. Patient reportedly fell earlier tonight. She does have a history of falls in the past. Patient reports pain in her ankle that is constant, worsens with movement. She denies headache. Loss of consciousness. She denies neck pain, back pain, hip pain, chest pain and shortness of breath.  Patient is a 75 y.o. female presenting with lower extremity injury.  Ankle Injury    Past Medical History  Diagnosis Date  . Allergic rhinitis   . Anemia   . Anxiety   . Depression   . GERD (gastroesophageal reflux disease)   . Hypertension   . Low back pain   . Arthritis     abnormal gait   . OSA on CPAP   . Degenerative disc disease, lumbar     L5 nerve impingement   . 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 Arm fracture, left   . Onychomycosis   . Ankle fracture, right   . Hyperglycemia   . Mediastinal lymphadenopathy 1/9    resolving   . COPD (chronic obstructive pulmonary disease) (HCC)     chronic CO2 retention, decreased DLCO   . Cystic acne     adult   . Sleep apnea     stop bang score 6  . History of colon surgery     right hemicolectomy for high-grade adenomas in right colon 2013  . Abnormality of gait 06/06/2014  . Memory difficulty 09/20/2014   Past Surgical History  Procedure Laterality Date  . Abdominal hysterectomy  1976    secondary to bleeding   . Oophorectomy  1976  . Epidural steroids    . Orif ankle fracture  01/18/2012    Procedure: OPEN REDUCTION INTERNAL FIXATION (ORIF) ANKLE FRACTURE;  Surgeon: Arther Abbott, MD;  Location: [facility];  Service: Orthopedics;  Laterality: Left;  . Colonoscopy   07/20/2012    Dr. Gala Romney: multiple colonic polyps with large polyps on right side, s/p saline-assisted debulking piecemeal polypectomy and ablation. Not all removed. Path with tubulovillous adenomas, high grade dysplasia.   [name] Appendectomy    . Colon resection  08/18/2012    Procedure: HAND ASSISTED LAPAROSCOPIC COLON RESECTION;  Surgeon: Jamesetta So, MD;  Location: [facility];  Service: General;  Laterality: N/A;  . Colonoscopy N/A 03/09/2013    [id number] polyps-tubular adenomas. S/p right hemicolectomy. next tcs 02/2018  . Esophagogastroduodenoscopy (egd) with esophageal dilation N/A 02/06/2014    Procedure: ESOPHAGOGASTRODUODENOSCOPY (EGD) WITH ESOPHAGEAL DILATION;  Surgeon: Daneil Dolin, MD;  Location: [facility];  Service: Endoscopy;  Laterality: N/A;  9:30   Family History  Problem Relation Age of Onset  . Stomach cancer Father   . Cancer Father   . Cancer Mother     brain   . Breast cancer Mother   . Arthritis     Social History  Substance Use Topics  . Smoking status: Former Smoker -- 1.50 packs/day for 40 years    Types: Cigarettes    Quit date: 06/21/2006  . Smokeless tobacco: Never Used  . Alcohol Use: No   OB History    Gravida Para Term Preterm AB TAB SAB Ectopic Multiple  Living   5 4 4  1  1         Review of Systems  Musculoskeletal: Positive for arthralgias.  Neurological: Negative for syncope.  All other systems reviewed and are negative.     Allergies  Fenofibrate; Pravastatin sodium; Sulfonamide derivatives; and Ciprofloxacin  Home Medications   Prior to Admission medications   Medication Sig Start Date End Date Taking? Authorizing Provider  acetaminophen (TYLENOL) 325 MG tablet Take 325 mg by mouth 2 (two) times daily.  05/20/14   [name], MD  Albuterol Sulfate (PROAIR RESPICLICK) 123XX123 (90 BASE) MCG/ACT AEPB Inhale 2 puffs into the lungs every 8 (eight) hours as needed. 11/11/15   [name], MD  alendronate (FOSAMAX) 70 MG tablet Take  1 tablet (70 mg total) by mouth once a week. Take with a full glass of water on an empty stomach. 12/15/15   [name], MD  arformoterol (BROVANA) 15 MCG/2ML NEBU Take 2 mLs (15 mcg total) by nebulization 2 (two) times daily. 04/25/12   [name], MD  aspirin EC 81 MG tablet Take 1 tablet (81 mg total) by mouth daily. 05/20/14   [name], MD  atorvastatin (LIPITOR) 10 MG tablet Take 1 tablet (10 mg total) by mouth daily. 12/15/15   [name], MD  calcium-vitamin D (OSCAL WITH D) 500-200 MG-UNIT per tablet Take 1 tablet by mouth daily.     Historical Provider, MD  donepezil (ARICEPT) 10 MG tablet Take 1 tablet (10 mg total) by mouth at bedtime. 12/15/15   [name], MD  FLUoxetine (PROZAC) 40 MG capsule Take 1 capsule (40 mg total) by mouth daily. 12/15/15   [name], MD  Multiple Vitamins-Minerals (CEROVITE SENIOR) TABS Take 1 tablet by mouth daily.     Historical Provider, MD  omeprazole (PRILOSEC) 40 MG capsule Take 1 capsule (40 mg total) by mouth daily. 12/15/15   [name], MD  predniSONE (DELTASONE) 5 MG tablet Take 1 tablet (5 mg total) by mouth 2 (two) times daily with a meal. 12/15/15   [name], MD  verapamil (CALAN-SR) 240 MG CR tablet Take 1 tablet (240 mg total) by mouth at bedtime. 12/15/15   [name], MD   BP 157/87 mmHg  Pulse 92  Temp(Src) 98.1 F (36.7 C)  Resp 18  Ht 6\' 1"  (1.854 m)  Wt 250 lb (113.399 kg)  BMI 32.99 kg/m2  SpO2 91% Physical Exam  Constitutional: She is oriented to person, place, and time. She appears well-developed and well-nourished. No distress.  HENT:  Head: Normocephalic and atraumatic.  Right Ear: Hearing normal.  Left Ear: Hearing normal.  Nose: Nose normal.  Mouth/Throat: Oropharynx is clear and moist and mucous membranes are normal.  Eyes: Conjunctivae and EOM are normal. Pupils are equal, round, and reactive to light.  Neck: Normal range of motion. Neck supple.   Cardiovascular: Regular rhythm, S1 normal and S2 normal.  Exam reveals no gallop and no friction rub.   No murmur heard. Pulmonary/Chest: Effort normal and breath sounds normal. No respiratory distress. She exhibits no tenderness.  Abdominal: Soft. Normal appearance and bowel sounds are normal. There is no hepatosplenomegaly. There is no tenderness. There is no rebound, no guarding, no tenderness at McBurney's point and negative Murphy's sign. No hernia.  Musculoskeletal:       Right ankle: She exhibits decreased range of motion and swelling. She exhibits no deformity. Tenderness.  Neurological: She is  alert and oriented to person, place, and time. She has normal strength. No cranial nerve deficit or sensory deficit. Coordination normal. GCS eye subscore is 4. GCS verbal subscore is 5. GCS motor subscore is 6.  Skin: Skin is warm, dry and intact. No rash noted. No cyanosis.  Psychiatric: She has a normal mood and affect. Her speech is normal and behavior is normal. Thought content normal.  Nursing note and vitals reviewed.   ED Course  Procedures (including critical care time) Labs Review Labs Reviewed - No data to display  Imaging Review No results found. I have personally reviewed and evaluated these images and lab results as part of my medical decision-making.   EKG Interpretation None      MDM   Final diagnoses:  None   bimalleolar ankle fracture  Presents to the ER for evaluation of ankle injury from a fall. Patient had isolated ankle injury. There was tenderness and swelling with decreased range of motion but no obvious deformity. X-ray confirms nondisplaced medial and lateral malleolus fractures. Patient is neurovascularly intact. She was placed in a leg splint and will follow up with orthopedics.    Orpah Greek, MD 12/21/15 0111

## 2015-12-21 NOTE — Discharge Instructions (Signed)
Ankle Fracture A fracture is a break in a bone. The ankle joint is made up of three bones. These include the lower (distal)sections of your lower leg bones, called the tibia and fibula, along with a bone in your foot, called the talus. Depending on how bad the break is and if more than one ankle joint bone is broken, a cast or splint is used to protect and keep your injured bone from moving while it heals. Sometimes, surgery is required to help the fracture heal properly.  There are two general types of fractures:  Stable fracture. This includes a single fracture line through one bone, with no injury to ankle ligaments. A fracture of the talus that does not have any displacement (movement of the bone on either side of the fracture line) is also stable.  Unstable fracture. This includes more than one fracture line through one or more bones in the ankle joint. It also includes fractures that have displacement of the bone on either side of the fracture line. CAUSES  A direct blow to the ankle.   Quickly and severely twisting your ankle.  Trauma, such as a car accident or falling from a significant height. RISK FACTORS You may be at a higher risk of ankle fracture if:  You have certain medical conditions.  You are involved in high-impact sports.  You are involved in a high-impact car accident. SIGNS AND SYMPTOMS   Tender and swollen ankle.  Bruising around the injured ankle.  Pain on movement of the ankle.  Difficulty walking or putting weight on the ankle.  A cold foot below the site of the ankle injury. This can occur if the blood vessels passing through your injured ankle were also damaged.  Numbness in the foot below the site of the ankle injury. DIAGNOSIS  An ankle fracture is usually diagnosed with a physical exam and X-rays. A CT scan may also be required for complex fractures. TREATMENT  Stable fractures are treated with a cast or splint and using crutches to avoid putting  weight on your injured ankle. This is followed by an ankle strengthening program. Some patients require a special type of cast, depending on other medical problems they may have. Unstable fractures require surgery to ensure the bones heal properly. Your health care provider will tell you what type of fracture you have and the best treatment for your condition. HOME CARE INSTRUCTIONS   Review correct crutch use with your health care provider and use your crutches as directed. Safe use of crutches is extremely important. Misuse of crutches can cause you to fall or cause injury to nerves in your hands or armpits.  Do not put weight or pressure on the injured ankle until directed by your health care provider.  To lessen the swelling, keep the injured leg elevated while sitting or lying down.  Apply ice to the injured area:  Put ice in a plastic bag.  Place a towel between your cast and the bag.  Leave the ice on for 20 minutes, 2-3 times a day.  If you have a plaster or fiberglass cast:  Do not try to scratch the skin under the cast with any objects. This can increase your risk of skin infection.  Check the skin around the cast every day. You may put lotion on any red or sore areas.  Keep your cast dry and clean.  If you have a plaster splint:  Wear the splint as directed.  You may loosen the elastic   around the splint if your toes become numb, tingle, or turn cold or blue.  Do not put pressure on any part of your cast or splint; it may break. Rest your cast only on a pillow the first 24 hours until it is fully hardened.  Your cast or splint can be protected during bathing with a plastic bag sealed to your skin with medical tape. Do not lower the cast or splint into water.  Take medicines as directed by your health care provider. Only take over-the-counter or prescription medicines for pain, discomfort, or fever as directed by your health care provider.  Do not drive a vehicle until  your health care provider specifically tells you it is safe to do so.  If your health care provider has given you a follow-up appointment, it is very important to keep that appointment. Not keeping the appointment could result in a chronic or permanent injury, pain, and disability. If you have any problem keeping the appointment, call the facility for assistance. SEEK MEDICAL CARE IF: You develop increased swelling or discomfort. SEEK IMMEDIATE MEDICAL CARE IF:   Your cast gets damaged or breaks.  You have continued severe pain.  You develop new pain or swelling after the cast was put on.  Your skin or toenails below the injury turn blue or gray.  Your skin or toenails below the injury feel cold, numb, or have loss of sensitivity to touch.  There is a bad smell or pus draining from under the cast. MAKE SURE YOU:   Understand these instructions.  Will watch your condition.  Will get help right away if you are not doing well or get worse.   This information is not intended to replace advice given to you by your health care provider. Make sure you discuss any questions you have with your health care provider.   Document Released: 10/29/2000 Document Revised: 11/06/2013 Document Reviewed: 05/31/2013 Elsevier Interactive Patient Education 2016 [employer]  

## 2015-12-21 NOTE — ED Notes (Signed)
Patient verbalizes understanding of discharge instructions, prescriptions, home care and follow up care. Patient out of department at this time with family. 

## 2015-12-21 NOTE — ED Notes (Signed)
Pt c/o right ankle pain after falling due to her leg's giving away, pt and family report that pt has had problems with fall, is currently receiving home health, cms intact

## 2015-12-22 ENCOUNTER — Encounter: Payer: Self-pay | Admitting: Orthopedic Surgery

## 2015-12-22 ENCOUNTER — Ambulatory Visit (INDEPENDENT_AMBULATORY_CARE_PROVIDER_SITE_OTHER): Payer: Medicare PPO | Admitting: Orthopedic Surgery

## 2015-12-22 ENCOUNTER — Telehealth: Payer: Self-pay | Admitting: Family Medicine

## 2015-12-22 VITALS — BP 106/67 | Ht 73.0 in | Wt 250.0 lb

## 2015-12-22 DIAGNOSIS — S82841A Displaced bimalleolar fracture of right lower leg, initial encounter for closed fracture: Secondary | ICD-10-CM

## 2015-12-22 DIAGNOSIS — M84471D Pathological fracture, right ankle, subsequent encounter for fracture with routine healing: Secondary | ICD-10-CM | POA: Diagnosis not present

## 2015-12-22 NOTE — Addendum Note (Signed)
Addended by: Baldomero Lamy B on: 12/22/2015 02:20 PM   Modules accepted: Orders

## 2015-12-22 NOTE — Progress Notes (Signed)
Patient ID: [name], female   DOB: 23-Dec-1940, 75 y.o.   MRN: [id number]  Chief Complaint  Patient presents with  . Follow-up    er follow up Rt ankle fx, DOI 12/21/15    HPI [name] is a 75 y.o. female.  Right ankle fracture Pain right ankle date of injury 12/21/2015, location at home, quality of pain dull ache severity mild requiring only Tylenol timing constant worse with weightbearing associated symptoms swelling  Review of systems no fever normal sensation  Review of Systems Review of Systems   Past Medical History  Diagnosis Date  . Allergic rhinitis   . Anemia   . Anxiety   . Depression   . GERD (gastroesophageal reflux disease)   . Hypertension   . Low back pain   . Arthritis     abnormal gait   . OSA on CPAP   . Degenerative disc disease, lumbar     L5 nerve impingement   . 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 Arm fracture, left   . Onychomycosis   . Ankle fracture, right   . Hyperglycemia   . Mediastinal lymphadenopathy 1/9    resolving   . COPD (chronic obstructive pulmonary disease) (HCC)     chronic CO2 retention, decreased DLCO   . Cystic acne     adult   . Sleep apnea     stop bang score 6  . History of colon surgery     right hemicolectomy for high-grade adenomas in right colon 2013  . Abnormality of gait 06/06/2014  . Memory difficulty 09/20/2014    Past Surgical History  Procedure Laterality Date  . Abdominal hysterectomy  1976    secondary to bleeding   . Oophorectomy  1976  . Epidural steroids    . Orif ankle fracture  01/18/2012    Procedure: OPEN REDUCTION INTERNAL FIXATION (ORIF) ANKLE FRACTURE;  Surgeon: Arther Abbott, MD;  Location: [facility];  Service: Orthopedics;  Laterality: Left;  . Colonoscopy  07/20/2012    Dr. Gala Romney: multiple colonic polyps with large polyps on right side, s/p saline-assisted debulking piecemeal polypectomy and ablation. Not all removed. Path with tubulovillous adenomas,  high grade dysplasia.   [name] Appendectomy    . Colon resection  08/18/2012    Procedure: HAND ASSISTED LAPAROSCOPIC COLON RESECTION;  Surgeon: Jamesetta So, MD;  Location: [facility];  Service: General;  Laterality: N/A;  . Colonoscopy N/A 03/09/2013    [id number] polyps-tubular adenomas. S/p right hemicolectomy. next tcs 02/2018  . Esophagogastroduodenoscopy (egd) with esophageal dilation N/A 02/06/2014    Procedure: ESOPHAGOGASTRODUODENOSCOPY (EGD) WITH ESOPHAGEAL DILATION;  Surgeon: Daneil Dolin, MD;  Location: [facility];  Service: Endoscopy;  Laterality: N/A;  9:30    Family History  Problem Relation Age of Onset  . Stomach cancer Father   . Cancer Father   . Cancer Mother     brain   . Breast cancer Mother   . Arthritis      Social History Social History  Substance Use Topics  . Smoking status: Former Smoker -- 1.50 packs/day for 40 years    Types: Cigarettes    Quit date: 06/21/2006  . Smokeless tobacco: Never Used  . Alcohol Use: No    Allergies  Allergen Reactions  . Fenofibrate Nausea And Vomiting  . Pravastatin Sodium Nausea And Vomiting  . Sulfonamide Derivatives Other (See Comments)    unknown  . Ciprofloxacin Rash  Current Outpatient Prescriptions  Medication Sig Dispense Refill  . acetaminophen (TYLENOL) 325 MG tablet Take 325 mg by mouth 2 (two) times daily.     . Albuterol Sulfate (PROAIR RESPICLICK) 123XX123 (90 BASE) MCG/ACT AEPB Inhale 2 puffs into the lungs every 8 (eight) hours as needed. 1 each 11  . alendronate (FOSAMAX) 70 MG tablet Take 1 tablet (70 mg total) by mouth once a week. Take with a full glass of water on an empty stomach. 12 tablet 1  . arformoterol (BROVANA) 15 MCG/2ML NEBU Take 2 mLs (15 mcg total) by nebulization 2 (two) times daily. 360 mL 1  . aspirin EC 81 MG tablet Take 1 tablet (81 mg total) by mouth daily. 100 tablet 3  . atorvastatin (LIPITOR) 10 MG tablet Take 1 tablet (10 mg total) by mouth daily. 90 tablet 1  . calcium-vitamin  D (OSCAL WITH D) 500-200 MG-UNIT per tablet Take 1 tablet by mouth daily.     [name] donepezil (ARICEPT) 10 MG tablet Take 1 tablet (10 mg total) by mouth at bedtime. 90 tablet 1  . FLUoxetine (PROZAC) 40 MG capsule Take 1 capsule (40 mg total) by mouth daily. 90 capsule 1  . Misc. Devices [facility]) MISC Use as needed 1 each 0  . Multiple Vitamins-Minerals (CEROVITE SENIOR) TABS Take 1 tablet by mouth daily.     [name] omeprazole (PRILOSEC) 40 MG capsule Take 1 capsule (40 mg total) by mouth daily. 90 capsule 1  . predniSONE (DELTASONE) 5 MG tablet Take 1 tablet (5 mg total) by mouth 2 (two) times daily with a meal. 10 tablet 0  . traMADol (ULTRAM) 50 MG tablet Take 0.5-1 tablets (25-50 mg total) by mouth every 6 (six) hours as needed. 15 tablet 0  . verapamil (CALAN-SR) 240 MG CR tablet Take 1 tablet (240 mg total) by mouth at bedtime. 90 tablet 1   No current facility-administered medications for this visit.     Physical Exam Blood pressure 106/67, height 6\' 1"  (1.854 m), weight 250 lb (113.399 kg). Physical Exam The patient is well developed well nourished and well groomed.  Orientation to person place and time is normal  Mood is pleasant.  Ambulatory status nonweightbearing wheelchair bound  Right Ankle examination: Inspection reveals tenderness and swelling over the lateral malleolus. The medial malleolus is tender as well  Range of motion is limited by pain foot is plantigrade. Ankle stability tests were deferred because of pain in the ankle does not appear to be subluxated. Motor exam shows no atrophy  Skin shows mild bruising and ecchymosis. With multiple blisters Neurovascular exam is intact.  Opposite ankle shows no deformity other than a flat foot    Data Reviewed  I reviewed the x-ray and independently interpreted as 3 views of the ankle and there is a non displaced lateral malleolus fracture nondisplaced medial malleolus vertical with intact ankle mortise   Probable  supination adduction injury   Assessment    Ankle fracture:  Bimalleolar    Plan    Cam Walker nonweightbearing x-ray for 2 weeks from Friday  Tylenol working for pain

## 2015-12-22 NOTE — Patient Instructions (Signed)
No weightbearing on right foot  Keep elevated  Take Tylenol for pain  Follow-up erect 24th for x-rays right ankle

## 2015-12-22 NOTE — Telephone Encounter (Signed)
Wanted to let us know her mother fell and broke her ankle and had an appt with Aline Brochure today at 2pm

## 2015-12-22 NOTE — Telephone Encounter (Signed)
Patient fell and broke her ankle Saturday night, [name] has a question, please advise?

## 2015-12-23 ENCOUNTER — Telehealth: Payer: Self-pay | Admitting: Family Medicine

## 2015-12-23 DIAGNOSIS — J449 Chronic obstructive pulmonary disease, unspecified: Secondary | ICD-10-CM | POA: Diagnosis not present

## 2015-12-23 DIAGNOSIS — Z9981 Dependence on supplemental oxygen: Secondary | ICD-10-CM | POA: Diagnosis not present

## 2015-12-23 DIAGNOSIS — F068 Other specified mental disorders due to known physiological condition: Secondary | ICD-10-CM | POA: Diagnosis not present

## 2015-12-23 DIAGNOSIS — M4807 Spinal stenosis, lumbosacral region: Secondary | ICD-10-CM | POA: Diagnosis not present

## 2015-12-23 DIAGNOSIS — Z5181 Encounter for therapeutic drug level monitoring: Secondary | ICD-10-CM | POA: Diagnosis not present

## 2015-12-23 DIAGNOSIS — M25561 Pain in right knee: Secondary | ICD-10-CM | POA: Diagnosis not present

## 2015-12-23 DIAGNOSIS — M159 Polyosteoarthritis, unspecified: Secondary | ICD-10-CM | POA: Diagnosis not present

## 2015-12-23 DIAGNOSIS — I10 Essential (primary) hypertension: Secondary | ICD-10-CM | POA: Diagnosis not present

## 2015-12-23 DIAGNOSIS — M25562 Pain in left knee: Secondary | ICD-10-CM | POA: Diagnosis not present

## 2015-12-23 NOTE — Telephone Encounter (Signed)
Patient had a fall on Saturday with a Rt ankle Fx, she saw Dr. Shelva Majestic for consult.

## 2015-12-23 NOTE — Telephone Encounter (Signed)
PT just wanted to notify of injury

## 2015-12-24 ENCOUNTER — Telehealth: Payer: Self-pay | Admitting: Family Medicine

## 2015-12-24 NOTE — Telephone Encounter (Signed)
Daughter aware that the pcp cannot admit to snf.  Directed her to call facility of choice and ask to speak with social worker.

## 2015-12-24 NOTE — Telephone Encounter (Signed)
Exia Ms. Blackwells daughter is asking if Dr. Moshe Cipro could put her in Rehab until she is mobil again with her Fx Ankle, please advise?

## 2015-12-25 DIAGNOSIS — I10 Essential (primary) hypertension: Secondary | ICD-10-CM | POA: Diagnosis not present

## 2015-12-25 DIAGNOSIS — Z5181 Encounter for therapeutic drug level monitoring: Secondary | ICD-10-CM | POA: Diagnosis not present

## 2015-12-25 DIAGNOSIS — M25562 Pain in left knee: Secondary | ICD-10-CM | POA: Diagnosis not present

## 2015-12-25 DIAGNOSIS — Z9981 Dependence on supplemental oxygen: Secondary | ICD-10-CM | POA: Diagnosis not present

## 2015-12-25 DIAGNOSIS — M25561 Pain in right knee: Secondary | ICD-10-CM | POA: Diagnosis not present

## 2015-12-25 DIAGNOSIS — M159 Polyosteoarthritis, unspecified: Secondary | ICD-10-CM | POA: Diagnosis not present

## 2015-12-25 DIAGNOSIS — J449 Chronic obstructive pulmonary disease, unspecified: Secondary | ICD-10-CM | POA: Diagnosis not present

## 2015-12-25 DIAGNOSIS — M4807 Spinal stenosis, lumbosacral region: Secondary | ICD-10-CM | POA: Diagnosis not present

## 2015-12-25 DIAGNOSIS — F068 Other specified mental disorders due to known physiological condition: Secondary | ICD-10-CM | POA: Diagnosis not present

## 2016-01-06 DIAGNOSIS — J449 Chronic obstructive pulmonary disease, unspecified: Secondary | ICD-10-CM | POA: Diagnosis not present

## 2016-01-06 DIAGNOSIS — M4807 Spinal stenosis, lumbosacral region: Secondary | ICD-10-CM | POA: Diagnosis not present

## 2016-01-06 DIAGNOSIS — M159 Polyosteoarthritis, unspecified: Secondary | ICD-10-CM | POA: Diagnosis not present

## 2016-01-06 DIAGNOSIS — M25562 Pain in left knee: Secondary | ICD-10-CM | POA: Diagnosis not present

## 2016-01-06 DIAGNOSIS — M25561 Pain in right knee: Secondary | ICD-10-CM | POA: Diagnosis not present

## 2016-01-07 ENCOUNTER — Telehealth: Payer: Self-pay | Admitting: *Deleted

## 2016-01-07 DIAGNOSIS — M25561 Pain in right knee: Secondary | ICD-10-CM | POA: Diagnosis not present

## 2016-01-07 DIAGNOSIS — J449 Chronic obstructive pulmonary disease, unspecified: Secondary | ICD-10-CM | POA: Diagnosis not present

## 2016-01-07 DIAGNOSIS — Z9981 Dependence on supplemental oxygen: Secondary | ICD-10-CM | POA: Diagnosis not present

## 2016-01-07 DIAGNOSIS — M159 Polyosteoarthritis, unspecified: Secondary | ICD-10-CM | POA: Diagnosis not present

## 2016-01-07 DIAGNOSIS — I10 Essential (primary) hypertension: Secondary | ICD-10-CM | POA: Diagnosis not present

## 2016-01-07 DIAGNOSIS — Z5181 Encounter for therapeutic drug level monitoring: Secondary | ICD-10-CM | POA: Diagnosis not present

## 2016-01-07 DIAGNOSIS — M25562 Pain in left knee: Secondary | ICD-10-CM | POA: Diagnosis not present

## 2016-01-07 DIAGNOSIS — F068 Other specified mental disorders due to known physiological condition: Secondary | ICD-10-CM | POA: Diagnosis not present

## 2016-01-07 DIAGNOSIS — M4807 Spinal stenosis, lumbosacral region: Secondary | ICD-10-CM | POA: Diagnosis not present

## 2016-01-07 NOTE — Telephone Encounter (Signed)
Tell him to put it back the way he found it

## 2016-01-07 NOTE — Telephone Encounter (Signed)
THERAPIST CALLED CONCERNED THAT PATIENT HAD NOT REMOVED WRAP TO ANKLE SINCE SHE LEFT OFFICE 12/22/15, HE REPORTS THAT DRAINAGE WAS PRESENT SO HE REMOVED BANDAGES TO CHECK FOR WOUND AND A LOT OF SWELLING AND BLISTERS WERE PRESENT, ( ALSO PRESENT AT LAST VISIT) HE IS ASKING SHOULD THEY REDRESS WITH CURLEX AND ACE UNTIL APPT Friday, THEY DO NOT HAVE XEROFORM.

## 2016-01-07 NOTE — Telephone Encounter (Signed)
Spoke with patient's daughter [name]) who is also a [employer] scientist, she states she will redress leg until appointment Friday

## 2016-01-09 ENCOUNTER — Ambulatory Visit (INDEPENDENT_AMBULATORY_CARE_PROVIDER_SITE_OTHER): Payer: Medicare PPO

## 2016-01-09 ENCOUNTER — Ambulatory Visit (INDEPENDENT_AMBULATORY_CARE_PROVIDER_SITE_OTHER): Payer: Self-pay | Admitting: Orthopedic Surgery

## 2016-01-09 ENCOUNTER — Telehealth: Payer: Self-pay | Admitting: Orthopedic Surgery

## 2016-01-09 VITALS — BP 122/68 | Ht 73.0 in | Wt 250.0 lb

## 2016-01-09 DIAGNOSIS — S82891A Other fracture of right lower leg, initial encounter for closed fracture: Secondary | ICD-10-CM | POA: Diagnosis not present

## 2016-01-09 DIAGNOSIS — S82841P Displaced bimalleolar fracture of right lower leg, subsequent encounter for closed fracture with malunion: Secondary | ICD-10-CM

## 2016-01-09 DIAGNOSIS — Z9981 Dependence on supplemental oxygen: Secondary | ICD-10-CM | POA: Diagnosis not present

## 2016-01-09 DIAGNOSIS — I10 Essential (primary) hypertension: Secondary | ICD-10-CM | POA: Diagnosis not present

## 2016-01-09 DIAGNOSIS — Z5181 Encounter for therapeutic drug level monitoring: Secondary | ICD-10-CM | POA: Diagnosis not present

## 2016-01-09 DIAGNOSIS — M25562 Pain in left knee: Secondary | ICD-10-CM | POA: Diagnosis not present

## 2016-01-09 DIAGNOSIS — J449 Chronic obstructive pulmonary disease, unspecified: Secondary | ICD-10-CM | POA: Diagnosis not present

## 2016-01-09 DIAGNOSIS — M159 Polyosteoarthritis, unspecified: Secondary | ICD-10-CM | POA: Diagnosis not present

## 2016-01-09 DIAGNOSIS — M25561 Pain in right knee: Secondary | ICD-10-CM | POA: Diagnosis not present

## 2016-01-09 DIAGNOSIS — M4807 Spinal stenosis, lumbosacral region: Secondary | ICD-10-CM | POA: Diagnosis not present

## 2016-01-09 DIAGNOSIS — F068 Other specified mental disorders due to known physiological condition: Secondary | ICD-10-CM | POA: Diagnosis not present

## 2016-01-09 NOTE — Addendum Note (Signed)
Addended by: Baldomero Lamy B on: 01/09/2016 12:55 PM   Modules accepted: Orders

## 2016-01-09 NOTE — Progress Notes (Signed)
Patient ID: [name], female   DOB: November 29, 1940, 75 y.o.   MRN: [id number]  Chief Complaint  Patient presents with  . Follow-up    Recheck on right ankle fracture with xray, DOI 12-21-15.    HPI [name] is a 75 y.o. female. Follow-up x-rays after bimalleolar ankle fracture where now 19 days into this  On initial presentation the patient gave the following history  [name] is a 75 y.o. female. Right ankle fracture Pain right ankle date of injury 12/21/2015, location at home, quality of pain dull ache severity mild requiring only Tylenol timing constant worse with weightbearing associated symptoms swelling  She was treated with a Cam Walker and told to be strict nonweightbearing  Review of systems no fever normal sensation Review of Systems Review of Systems 1. Denies fever 2. Does have respiratory issues Remaining review of systems negative all systems reviewed   Past Medical History  Diagnosis Date  . Allergic rhinitis   . Anemia   . Anxiety   . Depression   . GERD (gastroesophageal reflux disease)   . Hypertension   . Low back pain   . Arthritis     abnormal gait   . OSA on CPAP   . Degenerative disc disease, lumbar     L5 nerve impingement   . 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 Arm fracture, left   . Onychomycosis   . Ankle fracture, right   . Hyperglycemia   . Mediastinal lymphadenopathy 1/9    resolving   . COPD (chronic obstructive pulmonary disease) (HCC)     chronic CO2 retention, decreased DLCO   . Cystic acne     adult   . Sleep apnea     stop bang score 6  . History of colon surgery     right hemicolectomy for high-grade adenomas in right colon 2013  . Abnormality of gait 06/06/2014  . Memory difficulty 09/20/2014    Past Surgical History  Procedure Laterality Date  . Abdominal hysterectomy  1976    secondary to bleeding   . Oophorectomy  1976  . Epidural steroids    . Orif ankle fracture   01/18/2012    Procedure: OPEN REDUCTION INTERNAL FIXATION (ORIF) ANKLE FRACTURE;  Surgeon: Arther Abbott, MD;  Location: [facility];  Service: Orthopedics;  Laterality: Left;  . Colonoscopy  07/20/2012    Dr. Gala Romney: multiple colonic polyps with large polyps on right side, s/p saline-assisted debulking piecemeal polypectomy and ablation. Not all removed. Path with tubulovillous adenomas, high grade dysplasia.   [name] Appendectomy    . Colon resection  08/18/2012    Procedure: HAND ASSISTED LAPAROSCOPIC COLON RESECTION;  Surgeon: Jamesetta So, MD;  Location: [facility];  Service: General;  Laterality: N/A;  . Colonoscopy N/A 03/09/2013    [id number] polyps-tubular adenomas. S/p right hemicolectomy. next tcs 02/2018  . Esophagogastroduodenoscopy (egd) with esophageal dilation N/A 02/06/2014    Procedure: ESOPHAGOGASTRODUODENOSCOPY (EGD) WITH ESOPHAGEAL DILATION;  Surgeon: Daneil Dolin, MD;  Location: [facility];  Service: Endoscopy;  Laterality: N/A;  9:30    Social History Social History  Substance Use Topics  . Smoking status: Former Smoker -- 1.50 packs/day for 40 years    Types: Cigarettes    Quit date: 06/21/2006  . Smokeless tobacco: Never Used  . Alcohol Use: No    Allergies  Allergen Reactions  . Fenofibrate Nausea And Vomiting  . Pravastatin Sodium Nausea And Vomiting  .  Sulfonamide Derivatives Other (See Comments)    unknown  . Ciprofloxacin Rash    Current Outpatient Prescriptions  Medication Sig Dispense Refill  . acetaminophen (TYLENOL) 325 MG tablet Take 325 mg by mouth 2 (two) times daily.     . Albuterol Sulfate (PROAIR RESPICLICK) [id number] (90 BASE) MCG/ACT AEPB Inhale 2 puffs into the lungs every 8 (eight) hours as needed. 1 each 11  . alendronate (FOSAMAX) 70 MG tablet Take 1 tablet (70 mg total) by mouth once a week. Take with a full glass of water on an empty stomach. 12 tablet 1  . arformoterol (BROVANA) 15 MCG/2ML NEBU Take 2 mLs (15 mcg total) by nebulization 2 (two) times  daily. 360 mL 1  . aspirin EC 81 MG tablet Take 1 tablet (81 mg total) by mouth daily. 100 tablet 3  . atorvastatin (LIPITOR) 10 MG tablet Take 1 tablet (10 mg total) by mouth daily. 90 tablet 1  . calcium-vitamin D (OSCAL WITH D) 500-200 MG-UNIT per tablet Take 1 tablet by mouth daily.     [name] donepezil (ARICEPT) 10 MG tablet Take 1 tablet (10 mg total) by mouth at bedtime. 90 tablet 1  . FLUoxetine (PROZAC) 40 MG capsule Take 1 capsule (40 mg total) by mouth daily. 90 capsule 1  . Misc. Devices [facility]) MISC Use as needed 1 each 0  . Multiple Vitamins-Minerals (CEROVITE SENIOR) TABS Take 1 tablet by mouth daily.     [name] omeprazole (PRILOSEC) 40 MG capsule Take 1 capsule (40 mg total) by mouth daily. 90 capsule 1  . predniSONE (DELTASONE) 5 MG tablet Take 1 tablet (5 mg total) by mouth 2 (two) times daily with a meal. 10 tablet 0  . traMADol (ULTRAM) 50 MG tablet Take 0.5-1 tablets (25-50 mg total) by mouth every 6 (six) hours as needed. 15 tablet 0  . verapamil (CALAN-SR) 240 MG CR tablet Take 1 tablet (240 mg total) by mouth at bedtime. 90 tablet 1   No current facility-administered medications for this visit.      Physical Exam Physical Exam Blood pressure 122/68, height 6\' 1"  (1.854 m), weight 250 lb (113.399 kg).  Gen. appearance well-groomed oxygen is noted The patient is alert and oriented person place and time Mood is normal affect is normal Ambulatory status nonweightbearing wheelchair can walk with assistance and a walker normally walks with minimal assistance  Exam of the right ankle  Inspection skin shows previous blisters which are all resolved skin otherwise intact, medial lateral tenderness ROM ankle range of motion 5 dorsiflexion 5 plantar flexion Stability the ankle is unstable medial lateral stable anterior posterior Strength normal muscle tone  Skin: Skin as stated  Pulses: Remain intact strong bounding  Neuro: Normal sensation in the foot  Left ankle was  previously operated on inspection range of motion stability and strength normal skin normal pulses normal neurologic exam normal     Data Reviewed Today's x-rays compared to the last film shows shift of the mortise laterally with bimalleolar fracture  Assessment    Displaced bimalleolar fracture right ankle    Plan    Open treatment internal fixation right ankle This procedure has been fully reviewed with the patient and written informed consent has been obtained.        Arther Abbott 01/09/2016, 12:20 PM

## 2016-01-09 NOTE — Patient Instructions (Signed)
SURGERY TUES 01/13/16- RT ANKLE ORIF

## 2016-01-09 NOTE — Telephone Encounter (Signed)
Regarding pre-authorization, out-patient surgery scheduled at [facility], [place], [phone], contacted insurer [employer]; per Laurice Record, no pre-authorization required. REF# [id number], 01/09/16, 3:55p.m.

## 2016-01-12 ENCOUNTER — Encounter (HOSPITAL_COMMUNITY)
Admission: RE | Admit: 2016-01-12 | Discharge: 2016-01-12 | Disposition: A | Discharge status: Home / Self Care | Payer: Medicare PPO | Source: Ambulatory Visit | Attending: Orthopedic Surgery | Admitting: Orthopedic Surgery

## 2016-01-12 ENCOUNTER — Encounter (HOSPITAL_COMMUNITY): Payer: Self-pay

## 2016-01-12 DIAGNOSIS — K219 Gastro-esophageal reflux disease without esophagitis: Secondary | ICD-10-CM | POA: Diagnosis present

## 2016-01-12 DIAGNOSIS — Z7982 Long term (current) use of aspirin: Secondary | ICD-10-CM | POA: Diagnosis not present

## 2016-01-12 DIAGNOSIS — S82841A Displaced bimalleolar fracture of right lower leg, initial encounter for closed fracture: Secondary | ICD-10-CM | POA: Diagnosis present

## 2016-01-12 DIAGNOSIS — I10 Essential (primary) hypertension: Secondary | ICD-10-CM | POA: Diagnosis present

## 2016-01-12 DIAGNOSIS — Z87891 Personal history of nicotine dependence: Secondary | ICD-10-CM | POA: Diagnosis not present

## 2016-01-12 DIAGNOSIS — S82841P Displaced bimalleolar fracture of right lower leg, subsequent encounter for closed fracture with malunion: Secondary | ICD-10-CM | POA: Diagnosis not present

## 2016-01-12 DIAGNOSIS — X58XXXA Exposure to other specified factors, initial encounter: Secondary | ICD-10-CM | POA: Diagnosis present

## 2016-01-12 DIAGNOSIS — Y92009 Unspecified place in unspecified non-institutional (private) residence as the place of occurrence of the external cause: Secondary | ICD-10-CM | POA: Diagnosis not present

## 2016-01-12 DIAGNOSIS — M25571 Pain in right ankle and joints of right foot: Secondary | ICD-10-CM | POA: Diagnosis present

## 2016-01-12 DIAGNOSIS — S82831A Other fracture of upper and lower end of right fibula, initial encounter for closed fracture: Secondary | ICD-10-CM | POA: Diagnosis not present

## 2016-01-12 DIAGNOSIS — G4733 Obstructive sleep apnea (adult) (pediatric): Secondary | ICD-10-CM | POA: Diagnosis present

## 2016-01-12 DIAGNOSIS — J449 Chronic obstructive pulmonary disease, unspecified: Secondary | ICD-10-CM | POA: Diagnosis present

## 2016-01-12 DIAGNOSIS — S8251XA Displaced fracture of medial malleolus of right tibia, initial encounter for closed fracture: Secondary | ICD-10-CM | POA: Diagnosis not present

## 2016-01-12 DIAGNOSIS — Z90710 Acquired absence of both cervix and uterus: Secondary | ICD-10-CM | POA: Diagnosis not present

## 2016-01-12 LAB — BASIC METABOLIC PANEL
ANION GAP: 10 (ref 5–15)
BUN: 21 mg/dL — ABNORMAL HIGH (ref 6–20)
CALCIUM: 9.3 mg/dL (ref 8.9–10.3)
CHLORIDE: 104 mmol/L (ref 101–111)
CO2: 31 mmol/L (ref 22–32)
Creatinine, Ser: 0.84 mg/dL (ref 0.44–1.00)
GFR calc non Af Amer: >60 mL/min (ref >60)
GLUCOSE: 90 mg/dL (ref 65–99)
POTASSIUM: 4.5 mmol/L (ref 3.5–5.1)
Sodium: 145 mmol/L (ref 135–145)

## 2016-01-12 LAB — CBC WITH DIFFERENTIAL/PLATELET
BASOS ABS: 0 10*3/uL (ref 0.0–0.1)
BASOS PCT: 0 %
Eosinophils Absolute: 0.3 10*3/uL (ref 0.0–0.7)
Eosinophils Relative: 5 %
HEMATOCRIT: 35.5 % — AB (ref 36.0–46.0)
HEMOGLOBIN: 10.6 g/dL — AB (ref 12.0–15.0)
LYMPHS PCT: 22 %
Lymphs Abs: 1.2 10*3/uL (ref 0.7–4.0)
MCH: 26.4 pg (ref 26.0–34.0)
MCHC: 29.9 g/dL — ABNORMAL LOW (ref 30.0–36.0)
MCV: 88.3 fL (ref 78.0–100.0)
MONO ABS: 0.3 10*3/uL (ref 0.1–1.0)
Monocytes Relative: 5 %
NEUTROS ABS: 3.6 10*3/uL (ref 1.7–7.7)
NEUTROS PCT: 68 %
Platelets: 278 10*3/uL (ref 150–400)
RBC: 4.02 MIL/uL (ref 3.87–5.11)
RDW: 15.3 % (ref 11.5–15.5)
WBC: 5.4 10*3/uL (ref 4.0–10.5)

## 2016-01-12 NOTE — Patient Instructions (Signed)
[name]  01/12/2016     @PREFPERIOPPHARMACY @   Your procedure is scheduled on  01/13/2016   Report to [facility] at  1215  P.M.  Call this number if you have problems the morning of surgery:  [phone]   Remember:  Do not eat food or drink liquids after midnight.  Take these medicines the morning of surgery with A SIP OF WATER aricept, prilosec, prozac, prednisone, tramdol, verapamil. Take your inhaler before you come and bring it with you. Take your nebulizer before you come.   Do not wear jewelry, make-up or nail polish.  Do not wear lotions, powders, or perfumes.  You may wear deodorant.  Do not shave 48 hours prior to surgery.  Men may shave face and neck.  Do not bring valuables to the hospital.  [facility] is not responsible for any belongings or valuables.  Contacts, dentures or bridgework may not be worn into surgery.  Leave your suitcase in the car.  After surgery it may be brought to your room.  For patients admitted to the hospital, discharge time will be determined by your treatment team.  Patients discharged the day of surgery will not be allowed to drive home.   Name and phone number of your driver:   family Special instructions:  none  Please read over the following fact sheets that you were given. Coughing and Deep Breathing, Surgical Site Infection Prevention, Anesthesia Post-op Instructions and Care and Recovery After Surgery      Displaced Fibular Ankle Fracture Treated With Open Reduction, Adult Your displaced fracture is at the part of the fibula that is located at your ankle. Displaced means that the bone is not lined up correctly. Because of this, the bones must be put back into position with a procedure called open reduction and internal fixation (ORIF). ORIF ankle surgery will provide the best chance for your ankle to heal right and decrease the chances of later arthritis and disability. LET [facility] CARE PROVIDER KNOW  ABOUT:  Any allergies you have.  All medicines you are taking, including vitamins, herbs, eye drops, creams, and over-the-counter medicines.  Previous problems you or members of your family have had with the use of anesthetics.  Any blood disorders you have.  Previous surgeries you have had.  Medical conditions you have. RISKS AND COMPLICATIONS Generally, this is a safe procedure. However, as with any procedure, complications can occur. Possible complications include:  Excessive bleeding.  Infection.  Posttraumatic arthritis.  Failure to heal properly resulting in an unstable ankle.  Stiffness of the ankle after the repair. BEFORE THE PROCEDURE  Do not eat or drink after midnight the day before your procedure, or as instructed by your health care provider.  Ask your health care provider about changing or stopping any regular medicines. You may need to stop taking certain medicines up to 1 week before the procedure.  You may have lab tests the morning of the procedure.  Make arrangements for someone to drive you home after your procedure. PROCEDURE  An IV tube will be inserted into one of your veins.  You will receive fluids and medicine to make you relax through this tube.  You will then be given medicines to make you sleep (general anesthetic).  A cut (incision) will be made on the outside of your ankle to expose the bone and clean it out.  The bone is then aligned back together, and screws  and plates are used to hold this in place.  The skin and tissue are sewn back together to cover the repaired bone.  Dressings are placed over your incision. AFTER THE PROCEDURE  You will be taken to a recovery area and monitored until the anesthetic wears off.  You will be given pain medicine to control the pain.  You will be discharged after your pain is controlled and you can drink liquids.   This information is not intended to replace advice given to you by your health  care provider. Make sure you discuss any questions you have with your health care provider.   Document Released: 11/01/2005 Document Revised: 11/22/2014 Document Reviewed: 05/31/2013 Elsevier Interactive Patient Education 2016 Elsevier Inc. Displaced Fibular Ankle Fracture Treated With Open Reduction, Adult, Care After Refer to this sheet in the next few weeks. These instructions provide you with information on caring for yourself after your procedure. Your health care provider may also give you more specific instructions. Your treatment has been planned according to current medical practices, but problems sometimes occur. Call your health care provider if you have any problems or questions after your procedure. WHAT TO EXPECT AFTER THE PROCEDURE After your procedure, it is typical to have the following:  Swelling, bruising, and soreness at your ankle and foot. You will be given pain medicines to control this.  Stiffness when you move your ankle. HOME CARE INSTRUCTIONS   You may resume your normal diet and activities as directed or allowed.  Apply ice to the injured area:  Put ice in a plastic bag.  Place a towel between your skin and the bag.  Leave the ice on for 20 minutes, 2-3 times a day.  Change dressings if necessary or as directed.  If you have a plaster or fiberglass cast:  Do not try to scratch the skin under the cast using sharp or pointed objects.  Check the skin around the cast every day. You may put lotion on any red or sore areas.  Keep your cast dry and clean.  Do not put pressure on any part of your cast or splint until it is fully hardened.  Your cast or splint can be protected during bathing with a plastic bag. Do not lower the cast or splint into water.  Only take over-the-counter or prescription medicines for pain, discomfort, or fever as directed by your health care provider.  Use crutches as directed, and do not exercise the leg unless  instructed.  Your health care provider may instruct you to remove your cam boot.  Keep follow-up appointments as directed.  Do not drive a car or operate a motor vehicle until your health care provider specifically tells you it is safe to do so. SEEK MEDICAL CARE IF:  You have redness, swelling, or increasing pain in your wound.   You have pus coming from your wound.   You notice a bad smell coming from your wound or dressing.   Your wound breaks open (edges not staying together) after sutures or staples have been removed.   You have numbness in your foot that is getting worse. If you do not have a window in your cast for observing the wound, drainage or minor bleeding may show up as a stain on the outside of your cast. Report these findings to your health care provider. SEEK IMMEDIATE MEDICAL CARE IF:  You have chest pain or shortness of breath.   This information is not intended to replace advice given to  you by your health care provider. Make sure you discuss any questions you have with your health care provider.   Document Released: 08/22/2013 Document Revised: 11/22/2014 Document Reviewed: 08/22/2013 Elsevier Interactive Patient Education 2016 Elsevier Inc. PATIENT INSTRUCTIONS POST-ANESTHESIA  IMMEDIATELY FOLLOWING SURGERY:  Do not drive or operate machinery for the first twenty four hours after surgery.  Do not make any important decisions for twenty four hours after surgery or while taking narcotic pain medications or sedatives.  If you develop intractable nausea and vomiting or a severe headache please notify your doctor immediately.  FOLLOW-UP:  Please make an appointment with your surgeon as instructed. You do not need to follow up with anesthesia unless specifically instructed to do so.  WOUND CARE INSTRUCTIONS (if applicable):  Keep a dry clean dressing on the anesthesia/puncture wound site if there is drainage.  Once the wound has quit draining you may leave it  open to air.  Generally you should leave the bandage intact for twenty four hours unless there is drainage.  If the epidural site drains for more than 36-48 hours please call the anesthesia department.  QUESTIONS?:  Please feel free to call your physician or the hospital operator if you have any questions, and they will be happy to assist you.

## 2016-01-12 NOTE — Pre-Procedure Instructions (Signed)
Patient and daughter given information to sign up for my chart at home.

## 2016-01-13 ENCOUNTER — Ambulatory Visit (HOSPITAL_COMMUNITY): Payer: Medicare PPO | Admitting: Anesthesiology

## 2016-01-13 ENCOUNTER — Ambulatory Visit (HOSPITAL_COMMUNITY): Payer: Medicare PPO

## 2016-01-13 ENCOUNTER — Inpatient Hospital Stay (HOSPITAL_COMMUNITY)
Admission: AD | Admit: 2016-01-13 | Discharge: 2016-01-15 | DRG: 494 | Disposition: A | Discharge status: Skilled Nursing Facility | Payer: Medicare PPO | Source: Ambulatory Visit | Attending: Orthopedic Surgery | Admitting: Orthopedic Surgery

## 2016-01-13 ENCOUNTER — Encounter (HOSPITAL_COMMUNITY): Payer: Self-pay | Admitting: *Deleted

## 2016-01-13 ENCOUNTER — Encounter (HOSPITAL_COMMUNITY)
Admission: AD | Disposition: A | Discharge status: Skilled Nursing Facility | Payer: Self-pay | Source: Ambulatory Visit | Attending: Orthopedic Surgery

## 2016-01-13 DIAGNOSIS — S82831A Other fracture of upper and lower end of right fibula, initial encounter for closed fracture: Secondary | ICD-10-CM | POA: Diagnosis not present

## 2016-01-13 DIAGNOSIS — Z7982 Long term (current) use of aspirin: Secondary | ICD-10-CM | POA: Diagnosis not present

## 2016-01-13 DIAGNOSIS — J9601 Acute respiratory failure with hypoxia: Secondary | ICD-10-CM | POA: Diagnosis not present

## 2016-01-13 DIAGNOSIS — I10 Essential (primary) hypertension: Secondary | ICD-10-CM | POA: Diagnosis present

## 2016-01-13 DIAGNOSIS — S82841A Displaced bimalleolar fracture of right lower leg, initial encounter for closed fracture: Secondary | ICD-10-CM | POA: Diagnosis present

## 2016-01-13 DIAGNOSIS — G4733 Obstructive sleep apnea (adult) (pediatric): Secondary | ICD-10-CM | POA: Diagnosis present

## 2016-01-13 DIAGNOSIS — S82841P Displaced bimalleolar fracture of right lower leg, subsequent encounter for closed fracture with malunion: Secondary | ICD-10-CM | POA: Diagnosis not present

## 2016-01-13 DIAGNOSIS — Y92009 Unspecified place in unspecified non-institutional (private) residence as the place of occurrence of the external cause: Secondary | ICD-10-CM | POA: Diagnosis not present

## 2016-01-13 DIAGNOSIS — R296 Repeated falls: Secondary | ICD-10-CM | POA: Diagnosis not present

## 2016-01-13 DIAGNOSIS — X58XXXA Exposure to other specified factors, initial encounter: Secondary | ICD-10-CM | POA: Diagnosis present

## 2016-01-13 DIAGNOSIS — K219 Gastro-esophageal reflux disease without esophagitis: Secondary | ICD-10-CM | POA: Diagnosis present

## 2016-01-13 DIAGNOSIS — J449 Chronic obstructive pulmonary disease, unspecified: Secondary | ICD-10-CM | POA: Diagnosis present

## 2016-01-13 DIAGNOSIS — Z90710 Acquired absence of both cervix and uterus: Secondary | ICD-10-CM

## 2016-01-13 DIAGNOSIS — Z87891 Personal history of nicotine dependence: Secondary | ICD-10-CM

## 2016-01-13 DIAGNOSIS — S82891A Other fracture of right lower leg, initial encounter for closed fracture: Secondary | ICD-10-CM

## 2016-01-13 DIAGNOSIS — S8251XA Displaced fracture of medial malleolus of right tibia, initial encounter for closed fracture: Secondary | ICD-10-CM | POA: Diagnosis not present

## 2016-01-13 DIAGNOSIS — J42 Unspecified chronic bronchitis: Secondary | ICD-10-CM | POA: Diagnosis not present

## 2016-01-13 DIAGNOSIS — M25571 Pain in right ankle and joints of right foot: Secondary | ICD-10-CM | POA: Diagnosis present

## 2016-01-13 DIAGNOSIS — J690 Pneumonitis due to inhalation of food and vomit: Secondary | ICD-10-CM | POA: Diagnosis not present

## 2016-01-13 DIAGNOSIS — S82843A Displaced bimalleolar fracture of unspecified lower leg, initial encounter for closed fracture: Secondary | ICD-10-CM | POA: Diagnosis present

## 2016-01-13 DIAGNOSIS — J441 Chronic obstructive pulmonary disease with (acute) exacerbation: Secondary | ICD-10-CM | POA: Diagnosis not present

## 2016-01-13 HISTORY — PX: ORIF ANKLE FRACTURE: SHX5408

## 2016-01-13 SURGERY — OPEN REDUCTION INTERNAL FIXATION (ORIF) ANKLE FRACTURE
Anesthesia: Spinal | Site: Ankle | Laterality: Right

## 2016-01-13 MED ORDER — ARFORMOTEROL TARTRATE 15 MCG/2ML IN NEBU
15.0000 ug | INHALATION_SOLUTION | Freq: Two times a day (BID) | RESPIRATORY_TRACT | Status: DC
  Administered 2016-01-14 (×2): 15 ug via RESPIRATORY_TRACT
  Filled 2016-01-13 (×8): qty 2

## 2016-01-13 MED ORDER — METOCLOPRAMIDE HCL 10 MG PO TABS: 5.0000 mg | ORAL_TABLET | Freq: Three times a day (TID) | ORAL | Status: DC | PRN

## 2016-01-13 MED ORDER — CHLORHEXIDINE GLUCONATE 4 % EX LIQD: 60.0000 mL | Freq: Once | CUTANEOUS | Status: DC

## 2016-01-13 MED ORDER — PROPOFOL 10 MG/ML IV BOLUS
INTRAVENOUS | Status: AC
  Filled 2016-01-13: qty 20

## 2016-01-13 MED ORDER — ONDANSETRON HCL 4 MG/2ML IJ SOLN
4.0000 mg | Freq: Four times a day (QID) | INTRAMUSCULAR | Status: DC | PRN
  Administered 2016-01-13 – 2016-01-14 (×2): 4 mg via INTRAVENOUS
  Filled 2016-01-13 (×2): qty 2

## 2016-01-13 MED ORDER — ADULT MULTIVITAMIN W/MINERALS CH
1.0000 | ORAL_TABLET | Freq: Every day | ORAL | Status: DC
  Administered 2016-01-13 – 2016-01-15 (×3): 1 via ORAL
  Filled 2016-01-13 (×5): qty 1

## 2016-01-13 MED ORDER — LORATADINE 10 MG PO TABS
10.0000 mg | ORAL_TABLET | Freq: Every day | ORAL | Status: DC
  Administered 2016-01-13 – 2016-01-15 (×3): 10 mg via ORAL
  Filled 2016-01-13 (×3): qty 1

## 2016-01-13 MED ORDER — ACETAMINOPHEN 650 MG RE SUPP: 650.0000 mg | Freq: Four times a day (QID) | RECTAL | Status: DC | PRN

## 2016-01-13 MED ORDER — FENTANYL CITRATE (PF) 100 MCG/2ML IJ SOLN
INTRAMUSCULAR | Status: AC
  Filled 2016-01-13: qty 2

## 2016-01-13 MED ORDER — ONDANSETRON HCL 4 MG PO TABS
4.0000 mg | ORAL_TABLET | Freq: Four times a day (QID) | ORAL | Status: DC | PRN
  Administered 2016-01-14 – 2016-01-15 (×3): 4 mg via ORAL
  Filled 2016-01-13 (×3): qty 1

## 2016-01-13 MED ORDER — POLYETHYLENE GLYCOL 3350 17 G PO PACK
17.0000 g | PACK | Freq: Every day | ORAL | Status: DC
  Administered 2016-01-14 – 2016-01-15 (×2): 17 g via ORAL
  Filled 2016-01-13 (×3): qty 1

## 2016-01-13 MED ORDER — ALBUTEROL SULFATE 108 (90 BASE) MCG/ACT IN AEPB: 2.0000 | INHALATION_SPRAY | Freq: Three times a day (TID) | RESPIRATORY_TRACT | Status: DC | PRN

## 2016-01-13 MED ORDER — DOCUSATE SODIUM 100 MG PO CAPS
100.0000 mg | ORAL_CAPSULE | Freq: Two times a day (BID) | ORAL | Status: DC
  Administered 2016-01-13 – 2016-01-15 (×4): 100 mg via ORAL
  Filled 2016-01-13 (×4): qty 1

## 2016-01-13 MED ORDER — ASPIRIN EC 325 MG PO TBEC
325.0000 mg | DELAYED_RELEASE_TABLET | Freq: Every day | ORAL | Status: DC
  Administered 2016-01-14 – 2016-01-15 (×2): 325 mg via ORAL
  Filled 2016-01-13 (×2): qty 1

## 2016-01-13 MED ORDER — BISACODYL 10 MG RE SUPP: 10.0000 mg | Freq: Every day | RECTAL | Status: DC | PRN

## 2016-01-13 MED ORDER — METOCLOPRAMIDE HCL 5 MG/ML IJ SOLN: 5.0000 mg | Freq: Three times a day (TID) | INTRAMUSCULAR | Status: DC | PRN

## 2016-01-13 MED ORDER — MIDAZOLAM HCL 5 MG/5ML IJ SOLN
INTRAMUSCULAR | Status: DC | PRN
  Administered 2016-01-13: 2 mg via INTRAVENOUS

## 2016-01-13 MED ORDER — ACETAMINOPHEN 325 MG PO TABS: 650.0000 mg | ORAL_TABLET | Freq: Four times a day (QID) | ORAL | Status: DC | PRN

## 2016-01-13 MED ORDER — BUPIVACAINE-EPINEPHRINE (PF) 0.5% -1:200000 IJ SOLN
INTRAMUSCULAR | Status: AC
  Filled 2016-01-13: qty 60

## 2016-01-13 MED ORDER — LOPERAMIDE HCL 2 MG PO CAPS
2.0000 mg | ORAL_CAPSULE | Freq: Every day | ORAL | Status: DC
  Administered 2016-01-14 – 2016-01-15 (×2): 2 mg via ORAL
  Filled 2016-01-13 (×3): qty 1

## 2016-01-13 MED ORDER — VERAPAMIL HCL ER 240 MG PO TBCR
240.0000 mg | EXTENDED_RELEASE_TABLET | Freq: Every day | ORAL | Status: DC
  Administered 2016-01-13 – 2016-01-14 (×2): 240 mg via ORAL
  Filled 2016-01-13 (×2): qty 1

## 2016-01-13 MED ORDER — BUPIVACAINE IN DEXTROSE 0.75-8.25 % IT SOLN
INTRATHECAL | Status: AC
  Filled 2016-01-13: qty 2

## 2016-01-13 MED ORDER — 0.9 % SODIUM CHLORIDE (POUR BTL) OPTIME
TOPICAL | Status: DC | PRN
  Administered 2016-01-13 (×2): 1000 mL

## 2016-01-13 MED ORDER — PHENOL 1.4 % MT LIQD: 1.0000 | OROMUCOSAL | Status: DC | PRN

## 2016-01-13 MED ORDER — FENTANYL CITRATE (PF) 100 MCG/2ML IJ SOLN: 25.0000 ug | INTRAMUSCULAR | Status: DC | PRN

## 2016-01-13 MED ORDER — MIDAZOLAM HCL 2 MG/2ML IJ SOLN
INTRAMUSCULAR | Status: AC
  Filled 2016-01-13: qty 2

## 2016-01-13 MED ORDER — MENTHOL 3 MG MT LOZG
1.0000 | LOZENGE | OROMUCOSAL | Status: DC | PRN
  Filled 2016-01-13: qty 9

## 2016-01-13 MED ORDER — EPHEDRINE SULFATE 50 MG/ML IJ SOLN
INTRAMUSCULAR | Status: DC | PRN
  Administered 2016-01-13: 5 mg via INTRAVENOUS
  Administered 2016-01-13: 10 mg via INTRAVENOUS
  Administered 2016-01-13: 5 mg via INTRAVENOUS

## 2016-01-13 MED ORDER — MORPHINE SULFATE (PF) 2 MG/ML IV SOLN
0.5000 mg | INTRAVENOUS | Status: DC | PRN
  Administered 2016-01-13 – 2016-01-14 (×4): 0.5 mg via INTRAVENOUS
  Filled 2016-01-13 (×4): qty 1

## 2016-01-13 MED ORDER — BUPIVACAINE-EPINEPHRINE (PF) 0.5% -1:200000 IJ SOLN
INTRAMUSCULAR | Status: DC | PRN
  Administered 2016-01-13: 60 mL

## 2016-01-13 MED ORDER — FENTANYL CITRATE (PF) 100 MCG/2ML IJ SOLN
INTRAMUSCULAR | Status: DC | PRN
  Administered 2016-01-13 (×3): 25 ug via INTRAVENOUS

## 2016-01-13 MED ORDER — ALBUTEROL SULFATE (2.5 MG/3ML) 0.083% IN NEBU: 2.5000 mg | INHALATION_SOLUTION | Freq: Three times a day (TID) | RESPIRATORY_TRACT | Status: DC | PRN

## 2016-01-13 MED ORDER — FENTANYL CITRATE (PF) 100 MCG/2ML IJ SOLN
25.0000 ug | INTRAMUSCULAR | Status: DC
  Administered 2016-01-13: 25 ug via INTRAVENOUS

## 2016-01-13 MED ORDER — CALCIUM CARBONATE-VITAMIN D 500-200 MG-UNIT PO TABS
1.0000 | ORAL_TABLET | Freq: Every day | ORAL | Status: DC
  Administered 2016-01-13 – 2016-01-15 (×3): 1 via ORAL
  Filled 2016-01-13 (×3): qty 1

## 2016-01-13 MED ORDER — PROPOFOL 500 MG/50ML IV EMUL
INTRAVENOUS | Status: DC | PRN
  Administered 2016-01-13: 30 ug/kg/min via INTRAVENOUS
  Administered 2016-01-13: 14:00:00 via INTRAVENOUS

## 2016-01-13 MED ORDER — DONEPEZIL HCL 5 MG PO TABS
10.0000 mg | ORAL_TABLET | Freq: Every day | ORAL | Status: DC
  Administered 2016-01-13 – 2016-01-14 (×2): 10 mg via ORAL
  Filled 2016-01-13 (×2): qty 2

## 2016-01-13 MED ORDER — LACTATED RINGERS IV SOLN
INTRAVENOUS | Status: DC
  Administered 2016-01-13 (×2): via INTRAVENOUS

## 2016-01-13 MED ORDER — ONDANSETRON HCL 4 MG/2ML IJ SOLN: 4.0000 mg | Freq: Once | INTRAMUSCULAR | Status: DC | PRN

## 2016-01-13 MED ORDER — PANTOPRAZOLE SODIUM 40 MG PO TBEC
40.0000 mg | DELAYED_RELEASE_TABLET | Freq: Every day | ORAL | Status: DC
  Administered 2016-01-14 – 2016-01-15 (×2): 40 mg via ORAL
  Filled 2016-01-13 (×3): qty 1

## 2016-01-13 MED ORDER — SODIUM CHLORIDE 0.9 % IV SOLN
INTRAVENOUS | Status: DC
  Administered 2016-01-13 – 2016-01-14 (×2): via INTRAVENOUS

## 2016-01-13 MED ORDER — CEFAZOLIN SODIUM-DEXTROSE 2-3 GM-% IV SOLR
2.0000 g | Freq: Four times a day (QID) | INTRAVENOUS | Status: AC
  Administered 2016-01-13 – 2016-01-14 (×2): 2 g via INTRAVENOUS
  Filled 2016-01-13 (×2): qty 50

## 2016-01-13 MED ORDER — MIDAZOLAM HCL 2 MG/2ML IJ SOLN
1.0000 mg | INTRAMUSCULAR | Status: DC | PRN
  Administered 2016-01-13: 2 mg via INTRAVENOUS

## 2016-01-13 MED ORDER — FLUOXETINE HCL 20 MG PO CAPS
40.0000 mg | ORAL_CAPSULE | Freq: Every day | ORAL | Status: DC
  Administered 2016-01-14 – 2016-01-15 (×2): 40 mg via ORAL
  Filled 2016-01-13 (×5): qty 2

## 2016-01-13 MED ORDER — HYDROCODONE-ACETAMINOPHEN 5-325 MG PO TABS
1.0000 | ORAL_TABLET | Freq: Four times a day (QID) | ORAL | Status: DC | PRN
  Administered 2016-01-13: 1 via ORAL
  Administered 2016-01-14 – 2016-01-15 (×5): 2 via ORAL
  Filled 2016-01-13 (×5): qty 2
  Filled 2016-01-13: qty 1

## 2016-01-13 MED ORDER — CEFAZOLIN SODIUM-DEXTROSE 2-3 GM-% IV SOLR
2.0000 g | INTRAVENOUS | Status: AC
  Administered 2016-01-13: 2 g via INTRAVENOUS
  Filled 2016-01-13: qty 50

## 2016-01-13 MED ORDER — ATORVASTATIN CALCIUM 10 MG PO TABS
10.0000 mg | ORAL_TABLET | Freq: Every day | ORAL | Status: DC
  Administered 2016-01-13 – 2016-01-15 (×3): 10 mg via ORAL
  Filled 2016-01-13 (×3): qty 1

## 2016-01-13 MED ORDER — ALENDRONATE SODIUM 70 MG PO TABS: 70.0000 mg | ORAL_TABLET | ORAL | Status: DC

## 2016-01-13 SURGICAL SUPPLY — 72 items
BAG HAMPER (MISCELLANEOUS) ×3 IMPLANT
BANDAGE ELASTIC 3 VELCRO NS (GAUZE/BANDAGES/DRESSINGS) ×3 IMPLANT
BANDAGE ELASTIC 4 VELCRO NS (GAUZE/BANDAGES/DRESSINGS) ×6 IMPLANT
BANDAGE ESMARK 4X12 BL STRL LF (DISPOSABLE) ×1 IMPLANT
BIT DRILL 3.5X122MM AO FIT (BIT) IMPLANT
BIT DRILL CANN 2.7 (BIT)
BIT DRILL CANN 2.7MM (BIT)
BIT DRILL SRG 2.7XCANN AO CPLG (BIT) IMPLANT
BIT DRL SRG 2.7XCANN AO CPLNG (BIT)
BLADE SURG SZ10 CARB STEEL (BLADE) ×3 IMPLANT
BNDG COHESIVE 4X5 TAN STRL (GAUZE/BANDAGES/DRESSINGS) ×3 IMPLANT
BNDG ESMARK 4X12 BLUE STRL LF (DISPOSABLE) ×3
CHLORAPREP W/TINT 26ML (MISCELLANEOUS) IMPLANT
CLOTH BEACON ORANGE TIMEOUT ST (SAFETY) ×3 IMPLANT
COVER LIGHT HANDLE STERIS (MISCELLANEOUS) ×6 IMPLANT
CUFF TOURNIQUET SINGLE 34IN LL (TOURNIQUET CUFF) ×3 IMPLANT
CUFF TOURNIQUET SINGLE 44IN (TOURNIQUET CUFF) IMPLANT
DRAPE C-ARM FOLDED MOBILE STRL (DRAPES) ×3 IMPLANT
DRAPE PROXIMA HALF (DRAPES) ×3 IMPLANT
DRILL 2.6X122MM WL AO SHAFT (BIT) ×3 IMPLANT
GAUZE SPONGE 4X4 12PLY STRL (GAUZE/BANDAGES/DRESSINGS) ×2 IMPLANT
GAUZE XEROFORM 5X9 LF (GAUZE/BANDAGES/DRESSINGS) ×3 IMPLANT
GLOVE BIOGEL PI IND STRL 7.0 (GLOVE) ×3 IMPLANT
GLOVE BIOGEL PI INDICATOR 7.0 (GLOVE) ×6
GLOVE SKINSENSE NS SZ8.0 LF (GLOVE) ×2
GLOVE SKINSENSE STRL SZ8.0 LF (GLOVE) ×1 IMPLANT
GLOVE SS N UNI LF 8.5 STRL (GLOVE) ×3 IMPLANT
GOWN STRL REUS W/TWL LRG LVL3 (GOWN DISPOSABLE) ×9 IMPLANT
GOWN STRL REUS W/TWL XL LVL3 (GOWN DISPOSABLE) IMPLANT
INST SET MINOR BONE (KITS) ×3 IMPLANT
K-WIRE 1.4X100 (WIRE)
K-WIRE 1.6X150 (WIRE)
K-WIRE FX150X1.6XKRSH (WIRE)
K-WIRE SMOOTH 2.0X150 (WIRE)
KIT ROOM TURNOVER APOR (KITS) ×3 IMPLANT
KWIRE 1.4X100 (WIRE) IMPLANT
KWIRE FX150X1.6XKRSH (WIRE) IMPLANT
KWIRE SMOOTH 2.0X150 (WIRE) IMPLANT
MANIFOLD NEPTUNE II (INSTRUMENTS) ×3 IMPLANT
NEEDLE HYPO 21X1.5 SAFETY (NEEDLE) ×3 IMPLANT
NS IRRIG 1000ML POUR BTL (IV SOLUTION) ×3 IMPLANT
PACK BASIC LIMB (CUSTOM PROCEDURE TRAY) ×3 IMPLANT
PAD ABD 5X9 TENDERSORB (GAUZE/BANDAGES/DRESSINGS) ×3 IMPLANT
PAD ARMBOARD 7.5X6 YLW CONV (MISCELLANEOUS) ×3 IMPLANT
PAD CAST 4YDX4 CTTN HI CHSV (CAST SUPPLIES) ×1 IMPLANT
PADDING CAST COTTON 4X4 STRL (CAST SUPPLIES) ×2
PADDING WEBRIL 4 STERILE (GAUZE/BANDAGES/DRESSINGS) ×3 IMPLANT
PLATE FIBULA 4 HOLE (Plate) ×3 IMPLANT
PLATE FIBULA 4H (Plate) ×3 IMPLANT
SCREW 50X4.0MM (Screw) ×6 IMPLANT
SCREW BONE 14MMX3.5MM (Screw) ×6 IMPLANT
SCREW BONE 3.5X20MM (Screw) ×6 IMPLANT
SCREW BONE 3.5X44 (Screw) ×3 IMPLANT
SCREW BONE 36MMX3.5MM (Screw) ×3 IMPLANT
SCREW BONE NON-LCKING 3.5X12MM (Screw) ×3 IMPLANT
SCREW LOCK 20MMX3.5 (Screw) ×3 IMPLANT
SCREW LOCKING 3.5X16MM (Screw) ×6 IMPLANT
SCREW LOCKING 3.5X24 (Screw) ×3 IMPLANT
SET BASIN LINEN APH (SET/KITS/TRAYS/PACK) ×3 IMPLANT
SPLINT IMMOBILIZER J 3INX20FT (CAST SUPPLIES)
SPLINT J IMMOBILIZER 3X20FT (CAST SUPPLIES) IMPLANT
SPLINT J IMMOBILIZER 4X20FT (CAST SUPPLIES) ×1 IMPLANT
SPLINT J PLASTER J 4INX20Y (CAST SUPPLIES) ×2
SPONGE GAUZE 4X4 12PLY (GAUZE/BANDAGES/DRESSINGS) ×3 IMPLANT
SPONGE GAUZE 4X4 12PLY STER LF (GAUZE/BANDAGES/DRESSINGS) ×3 IMPLANT
SPONGE LAP 18X18 X RAY DECT (DISPOSABLE) ×3 IMPLANT
STAPLER VISISTAT 35W (STAPLE) ×3 IMPLANT
SUT ETHILON 3 0 FSL (SUTURE) IMPLANT
SUT MON AB 0 CT1 (SUTURE) ×3 IMPLANT
SUT MON AB 2-0 CT1 36 (SUTURE) ×3 IMPLANT
SYR 30ML LL (SYRINGE) ×3 IMPLANT
SYR BULB IRRIGATION 50ML (SYRINGE) ×3 IMPLANT

## 2016-01-13 NOTE — Progress Notes (Signed)
Utilization review completed.  L. J. Lyllian Gause RN, BSN, CM 

## 2016-01-13 NOTE — Progress Notes (Signed)
Pt to room 211 from surgery.  Alert and oriented, no complaints of pain at this time.  Will continue to monitor.

## 2016-01-13 NOTE — Anesthesia Procedure Notes (Signed)
Spinal Patient location during procedure: OR Start time: 01/13/2016 1:32 PM Staffing Resident/CRNA: Tressie Stalker E Preanesthetic Checklist Completed: patient identified, site marked, surgical consent, pre-op evaluation, timeout performed, IV checked, risks and benefits discussed and monitors and equipment checked Spinal Block Patient position: right lateral decubitus Prep: Betadine Patient monitoring: heart rate, cardiac monitor, continuous pulse ox and blood pressure Approach: right paramedian Location: L3-4 Injection technique: single-shot Needle Needle type: Spinocan  Needle gauge: 22 G Needle length: 9 cm Assessment Sensory level: T8 Additional Notes ATTEMPTS:1 TRAY [id number] TRAY EXPIRATION DATE:03/14/2017

## 2016-01-13 NOTE — Telephone Encounter (Signed)
01/13/16 8:20am Spoke w/pre-service center, [place], provided her with the reference information. 01/13/16 8:45am Spoke with Hoyle Sauer at [facility], who has updated patient's admit to out-patient in a bed

## 2016-01-13 NOTE — Transfer of Care (Signed)
Immediate Anesthesia Transfer of Care Note  Patient: [name]  Procedure(s) Performed: Procedure(s): OPEN REDUCTION INTERNAL FIXATION (ORIF) ANKLE FRACTURE (Right)  Patient Location: PACU  Anesthesia Type:Spinal  Level of Consciousness: awake and alert   Airway & Oxygen Therapy: Patient Spontanous Breathing and Patient connected to nasal cannula oxygen  Post-op Assessment: Report given to RN  Post vital signs: Reviewed and stable  Last Vitals:  Filed Vitals:   01/13/16 1245 01/13/16 1250  BP: 145/82 147/81  Temp:    Resp: 12 14    Complications: No apparent anesthesia complications

## 2016-01-13 NOTE — Interval H&P Note (Signed)
History and Physical Interval Note:  01/13/2016 12:57 PM  [name]  has presented today for surgery, with the diagnosis of RIGHT ANKLE BIMALLEOLAR FRACTURE  The various methods of treatment have been discussed with the patient and family. After consideration of risks, benefits and other options for treatment, the patient has consented to  Procedure(s): OPEN REDUCTION INTERNAL FIXATION (ORIF) ANKLE FRACTURE (Right) as a surgical intervention .  The patient's history has been reviewed, patient examined, no change in status, stable for surgery.  I have reviewed the patient's chart and labs.  Questions were answered to the patient's satisfaction.     Arther Abbott

## 2016-01-13 NOTE — Anesthesia Postprocedure Evaluation (Signed)
Anesthesia Post Note  Patient: [name]  Procedure(s) Performed: Procedure(s) (LRB): OPEN REDUCTION INTERNAL FIXATION (ORIF) ANKLE FRACTURE (Right)  Patient location during evaluation: PACU Anesthesia Type: Spinal Level of consciousness: awake and alert and oriented Pain management: pain level controlled Vital Signs Assessment: post-procedure vital signs reviewed and stable Respiratory status: spontaneous breathing and patient connected to nasal cannula oxygen Cardiovascular status: stable Postop Assessment: no signs of nausea or vomiting Anesthetic complications: no    Last Vitals:  Filed Vitals:   01/13/16 1504 01/13/16 1515  BP: 104/50 100/60  Pulse:  90  Temp: 36.7 C   Resp:  17    Last Pain:  Filed Vitals:   01/13/16 1522  PainSc: 0-No pain                 Tremeka Helbling

## 2016-01-13 NOTE — Brief Op Note (Signed)
01/13/2016  3:09 PM  PATIENT:  [name]  75 y.o. female  PRE-OPERATIVE DIAGNOSIS:  RIGHT ANKLE BIMALLEOLAR FRACTURE  POST-OPERATIVE DIAGNOSIS:  RIGHT ANKLE BIMALLEOLAR FRACTURE  PROCEDURE:  Procedure(s): OPEN REDUCTION INTERNAL FIXATION (ORIF) ANKLE FRACTURE (Right)   Implants  stryker lateral cluster plate  strkyer medial plate and 1 50 mm screw   SURGEON:  Surgeon(s) and Role:    * Carole Civil, MD - Primary  PHYSICIAN ASSISTANT:   ASSISTANTS: none   ANESTHESIA:   spinal  EBL:  Total I/O In: 1400 [I.V.:1400] Out: 25 [Blood:25]  BLOOD ADMINISTERED:none  DRAINS: none   LOCAL MEDICATIONS USED:  MARCAINE     SPECIMEN:  No Specimen  DISPOSITION OF SPECIMEN:  N/A  COUNTS:  YES  TOURNIQUET:   Total Tourniquet Time Documented: Thigh (Right) - 75 minutes Total: Thigh (Right) - 75 minutes   DICTATION: .Viviann Spare Dictation  PLAN OF CARE: Admit to inpatient   PATIENT DISPOSITION:  PACU - hemodynamically stable.   Delay start of Pharmacological VTE agent (>24hrs) due to surgical blood loss or risk of bleeding: yes

## 2016-01-13 NOTE — Anesthesia Preprocedure Evaluation (Signed)
Anesthesia Evaluation  Patient identified by MRN, date of birth, ID band Patient awake    Reviewed: Allergy & Precautions, H&P , NPO status , Patient's Chart, lab work & pertinent test results  History of Anesthesia Complications Negative for: history of anesthetic complications  Airway Mallampati: II  TM Distance: >3 FB     Dental  (+) Edentulous Upper, Edentulous Lower   Pulmonary shortness of breath, asthma , sleep apnea , COPD, former smoker,    breath sounds clear to auscultation       Cardiovascular hypertension, Pt. on medications  Rhythm:Regular Rate:Normal     Neuro/Psych PSYCHIATRIC DISORDERS Anxiety Depression    GI/Hepatic GERD  Medicated and Controlled,  Endo/Other    Renal/GU      Musculoskeletal  (+) Arthritis  (chronic LBP, hx epidural steroid injections),   Abdominal   Peds  Hematology   Anesthesia Other Findings   Reproductive/Obstetrics                             Anesthesia Physical Anesthesia Plan  ASA: III  Anesthesia Plan: Spinal   Post-op Pain Management:    Induction: Intravenous  Airway Management Planned: Nasal Cannula  Additional Equipment:   Intra-op Plan:   Post-operative Plan:   Informed Consent: I have reviewed the patients History and Physical, chart, labs and discussed the procedure including the risks, benefits and alternatives for the proposed anesthesia with the patient or authorized representative who has indicated his/her understanding and acceptance.     Plan Discussed with:   Anesthesia Plan Comments:         Anesthesia Quick Evaluation

## 2016-01-13 NOTE — H&P (Signed)
Patient ID: [name], female   DOB: Jul 06, 1941, 75 y.o.   MRN: [id number]    Chief complaint right ankle pain              HPI [name] is a 75 y.o. female. Follow-up x-rays after bimalleolar ankle fracture where now 19 days into this  On initial presentation the patient gave the following history  [name] is a 75 y.o. female.  Right ankle fracture Pain right ankle date of injury 12/21/2015, location at home, quality of pain dull ache severity mild requiring only Tylenol timing constant worse with weightbearing associated symptoms swelling  She was treated with a Cam Walker and told to be strict nonweightbearing  Review of systems no fever normal sensation Review of Systems Review of Systems 1. Denies fever 2. Does have respiratory issues Remaining review of systems negative all systems reviewed     Past Medical History   Diagnosis  Date   .  Allergic rhinitis     .  Anemia     .  Anxiety     .  Depression     .  GERD (gastroesophageal reflux disease)     .  Hypertension     .  Low back pain     .  Arthritis         abnormal gait    .  OSA on CPAP     .  Degenerative disc disease, lumbar         L5 nerve impingement    .  Degenerative disc disease, cervical         syrinx C3-7   .  Arm fracture, left     .  Onychomycosis     .  Ankle fracture, right     .  Hyperglycemia     .  Mediastinal lymphadenopathy  1/9       resolving    .  COPD (chronic obstructive pulmonary disease) (HCC)         chronic CO2 retention, decreased DLCO    .  Cystic acne         adult    .  Sleep apnea         stop bang score 6   .  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  Abnormality of gait  06/06/2014   .  Memory difficulty  09/20/2014       Past Surgical History   Procedure  Laterality  Date   .  Abdominal hysterectomy    1976       secondary to bleeding    .  Oophorectomy    1976   .   Epidural steroids       .  Orif ankle fracture    01/18/2012       Procedure: OPEN REDUCTION INTERNAL FIXATION (ORIF) ANKLE FRACTURE;  Surgeon: Arther Abbott, MD;  Location: [facility];  Service: Orthopedics;  Laterality: Left;   .  Colonoscopy    07/20/2012       Dr. Gala Romney: multiple colonic polyps with large polyps on right side, s/p saline-assisted debulking piecemeal polypectomy and ablation. Not all removed. Path with tubulovillous adenomas, high grade dysplasia.    [name]  Appendectomy       .  Colon resection    08/18/2012       Procedure: HAND ASSISTED LAPAROSCOPIC  COLON RESECTION;  Surgeon: Jamesetta So, MD;  Location: [facility];  Service: General;  Laterality: N/A;   .  Colonoscopy  N/A  03/09/2013       [id number] polyps-tubular adenomas. S/p right hemicolectomy. next tcs 02/2018   .  Esophagogastroduodenoscopy (egd) with esophageal dilation  N/A  02/06/2014       Procedure: ESOPHAGOGASTRODUODENOSCOPY (EGD) WITH ESOPHAGEAL DILATION;  Surgeon: Daneil Dolin, MD;  Location: [facility];  Service: Endoscopy;  Laterality: N/A;  9:30     Social History Social History   Substance Use Topics   .  Smoking status:  Former Smoker -- 1.50 packs/day for 40 years       Types:  Cigarettes       Quit date:  06/21/2006   .  Smokeless tobacco:  Never Used   .  Alcohol Use:  No       Allergies   Allergen  Reactions   .  Fenofibrate  Nausea And Vomiting   .  Pravastatin Sodium  Nausea And Vomiting   .  Sulfonamide Derivatives  Other (See Comments)       unknown   .  Ciprofloxacin  Rash       Current Outpatient Prescriptions   Medication  Sig  Dispense  Refill   .  acetaminophen (TYLENOL) 325 MG tablet  Take 325 mg by mouth 2 (two) times daily.        .  Albuterol Sulfate (PROAIR RESPICLICK) 123XX123 (90 BASE) MCG/ACT AEPB  Inhale 2 puffs into the lungs every 8 (eight) hours as needed.  1 each  11   .  alendronate (FOSAMAX) 70 MG tablet  Take 1 tablet (70 mg total) by mouth once a week. Take with a full  glass of water on an empty stomach.  12 tablet  1   .  arformoterol (BROVANA) 15 MCG/2ML NEBU  Take 2 mLs (15 mcg total) by nebulization 2 (two) times daily.  360 mL  1   .  aspirin EC 81 MG tablet  Take 1 tablet (81 mg total) by mouth daily.  100 tablet  3   .  atorvastatin (LIPITOR) 10 MG tablet  Take 1 tablet (10 mg total) by mouth daily.  90 tablet  1   .  calcium-vitamin D (OSCAL WITH D) 500-200 MG-UNIT per tablet  Take 1 tablet by mouth daily.        [name]  donepezil (ARICEPT) 10 MG tablet  Take 1 tablet (10 mg total) by mouth at bedtime.  90 tablet  1   .  FLUoxetine (PROZAC) 40 MG capsule  Take 1 capsule (40 mg total) by mouth daily.  90 capsule  1   .  Misc. Devices [facility]) MISC  Use as needed  1 each  0   .  Multiple Vitamins-Minerals (CEROVITE SENIOR) TABS  Take 1 tablet by mouth daily.        [name]  omeprazole (PRILOSEC) 40 MG capsule  Take 1 capsule (40 mg total) by mouth daily.  90 capsule  1   .  predniSONE (DELTASONE) 5 MG tablet  Take 1 tablet (5 mg total) by mouth 2 (two) times daily with a meal.  10 tablet  0   .  traMADol (ULTRAM) 50 MG tablet  Take 0.5-1 tablets (25-50 mg total) by mouth every 6 (six) hours as needed.  15 tablet  0   .  verapamil (CALAN-SR) 240 MG CR tablet  Take 1 tablet (  240 mg total) by mouth at bedtime.  90 tablet  1      No current facility-administered medications for this visit.       Physical Exam Physical Exam Blood pressure 122/68, height 6\' 1"  (1.854 m), weight 250 lb (113.399 kg).  Gen. appearance well-groomed oxygen is noted The patient is alert and oriented person place and time Mood is normal affect is normal Ambulatory status nonweightbearing wheelchair can walk with assistance and a walker normally walks with minimal assistance  Exam of the right ankle  Inspection skin shows previous blisters which are all resolved skin otherwise intact, medial lateral tenderness ROM ankle range of motion 5 dorsiflexion 5 plantar flexion Stability  the ankle is unstable medial lateral stable anterior posterior Strength normal muscle tone  Skin: Skin as stated  Pulses: Remain intact strong bounding  Neuro: Normal sensation in the foot  Left ankle was previously operated on inspection range of motion stability and strength normal skin normal pulses normal neurologic exam normal  The patient's upper extremities show no major alignment issues, contracture joint range of motion loss instability or laxity atrophy skin changes. Pulses and temperature are normal. Lymphadenopathy negative sensory exam normal.   Data Reviewed Today's x-rays compared to the last film shows shift of the mortise laterally with bimalleolar fracture  Assessment    Displaced bimalleolar fracture right ankle    Plan    Open treatment internal fixation right ankle This procedure has been fully reviewed with the patient and written informed consent has been obtained.        Arther Abbott

## 2016-01-14 ENCOUNTER — Encounter (HOSPITAL_COMMUNITY): Payer: Self-pay | Admitting: Orthopedic Surgery

## 2016-01-14 LAB — BASIC METABOLIC PANEL
ANION GAP: 7 (ref 5–15)
BUN: 17 mg/dL (ref 6–20)
CALCIUM: 8.6 mg/dL — AB (ref 8.9–10.3)
CO2: 33 mmol/L — ABNORMAL HIGH (ref 22–32)
Chloride: 105 mmol/L (ref 101–111)
Creatinine, Ser: 0.87 mg/dL (ref 0.44–1.00)
GFR calc Af Amer: >60 mL/min (ref >60)
GLUCOSE: 92 mg/dL (ref 65–99)
POTASSIUM: 4 mmol/L (ref 3.5–5.1)
SODIUM: 145 mmol/L (ref 135–145)

## 2016-01-14 LAB — CBC
HCT: 30.7 % — ABNORMAL LOW (ref 36.0–46.0)
HEMOGLOBIN: 9.1 g/dL — AB (ref 12.0–15.0)
MCH: 26.6 pg (ref 26.0–34.0)
MCHC: 29.6 g/dL — ABNORMAL LOW (ref 30.0–36.0)
MCV: 89.8 fL (ref 78.0–100.0)
PLATELETS: 202 10*3/uL (ref 150–400)
RBC: 3.42 MIL/uL — AB (ref 3.87–5.11)
RDW: 15.5 % (ref 11.5–15.5)
WBC: 8.1 10*3/uL (ref 4.0–10.5)

## 2016-01-14 NOTE — Addendum Note (Signed)
Addendum  created 01/14/16 [id number] by Charmaine Downs, CRNA   Modules edited: Clinical Notes   Clinical Notes:  File: [id number]

## 2016-01-14 NOTE — Evaluation (Signed)
Physical Therapy Evaluation Patient Details Name: [name] MRN: [id number] DOB: [birth year] Today's Date: 01/14/2016   History of Present Illness  Pain right ankle date of injury 12/21/2015, location at home, quality of pain dull ache severity mild requiring only Tylenol timing constant worse with weightbearing associated symptoms swelling. She was treated with a Cam Walker and told to be strict nonweightbearing.  Clinical Impression  Patient found supine in bed, pleasant and willing to participate in skilled PT services today; performed co-eval with OT today for therapist and patient safety. Able to perform bed mobility with min guard to Min(A) however did require Mod-max(A) with bridges to maintain NWB R LE; patient reports she had been NWB R LE before surgery however upon attempt of sit to stand transfer patient had significant difficulty with mobility, requiring Mod(A)x1-2 for sit to stand and Mod-Max(A) to maintain NWB status R LE. Poor safety awareness and adherence to precautions today. Extensively educated patient regarding NWB precaution and importance of not attempting to get up on her own at this point. Patient will continue to receive skilled PT services while she remains in this facility; recommend SNF at time of DC by MD. HR/O2 remained WNL throughout treatment. Patient left supine in bed with bed alarm set and all needs met.     Follow Up Recommendations SNF    Equipment Recommendations  None recommended by PT    Recommendations for Other Services       Precautions / Restrictions Precautions Precautions: Fall;Other (comment) Precaution Comments: NWB R ankle  Restrictions Weight Bearing Restrictions: Yes RLE Weight Bearing: Non weight bearing      Mobility  Bed Mobility Overal bed mobility: Needs Assistance Bed Mobility: Supine to Sit;Sit to Supine     Supine to sit: Min guard Sit to supine: Min assist   General bed mobility comments: Min assist to manage  R LE, Mod-Max assist to maintain NWB R LE during bridge on bed   Transfers Overall transfer level: Needs assistance Equipment used: Standard walker Transfers: Sit to/from Stand Sit to Stand: Mod assist;+2 physical assistance;From elevated surface         General transfer comment: +2 for safety during transfer; Mod(A) for transfer and Mod-Max(A) to maintain NWB R LE   Ambulation/Gait             General Gait Details: unable to perform safely today, did not attempt due to difficulty with sit to stand   Stairs            Wheelchair Mobility    Modified Rankin (Stroke Patients Only)       Balance Overall balance assessment: Needs assistance;History of Falls Sitting-balance support: No upper extremity supported Sitting balance-Leahy Scale: Good     Standing balance support: Bilateral upper extremity supported Standing balance-Leahy Scale: Fair Standing balance comment: lean to L                              Pertinent Vitals/Pain Pain Assessment: No/denies pain    Home Living Family/patient expects to be discharged to:: Private residence Living Arrangements: Children Available Help at Discharge: Family;Friend(s);Available 24 hours/day Type of Home: House Home Access: Stairs to enter Entrance Stairs-Rails: None Entrance Stairs-Number of Steps: 2 Home Layout: One level Home Equipment: Wheelchair - [employer] - 4 wheels;Cane - single point;Bedside commode      Prior Function Level of Independence: Independent with assistive device(s)  Comments: required assist from daughter for ADLs rather than mobility      Hand Dominance        Extremity/Trunk Assessment   Upper Extremity Assessment: Defer to OT evaluation           Lower Extremity Assessment: Generalized weakness      Cervical / Trunk Assessment: Kyphotic  Communication   Communication: No difficulties  Cognition Arousal/Alertness: Awake/alert Behavior During  Therapy: WFL for tasks assessed/performed Overall Cognitive Status: Within Functional Limits for tasks assessed       Memory: Decreased recall of precautions              General Comments General comments (skin integrity, edema, etc.): poor awareness of and adherence to precautions for NWB R LE    Exercises        Assessment/Plan    PT Assessment Patient needs continued PT services  PT Diagnosis Difficulty walking;Abnormality of gait;Generalized weakness   PT Problem List Decreased strength;Decreased coordination;Pain;Decreased activity tolerance;Decreased knowledge of use of DME;Decreased balance;Decreased safety awareness;Decreased mobility;Decreased knowledge of precautions  PT Treatment Interventions DME instruction;Therapeutic exercise;Gait training;Balance training;Stair training;Neuromuscular re-education;Functional mobility training;Therapeutic activities;Patient/family education   PT Goals (Current goals can be found in the Care Plan section) Acute Rehab PT Goals Patient Stated Goal: to go home and walk  PT Goal Formulation: With patient Time For Goal Achievement: 01/28/16 Potential to Achieve Goals: Good    Frequency BID   Barriers to discharge Inaccessible home environment;Other (comment) poor safety awareness     Co-evaluation PT/OT/SLP Co-Evaluation/Treatment: Yes Reason for Co-Treatment: For patient/therapist safety PT goals addressed during session: Mobility/safety with mobility;Proper use of DME         End of Session Equipment Utilized During Treatment: Gait belt Activity Tolerance: Patient tolerated treatment well Patient left: in bed;with call bell/phone within reach;with bed alarm set;with SCD's reapplied Nurse Communication: Other (comment) (Education officer, museum- SNF placement )    Functional Assessment Tool Used: Based on skilled clinical assessment of functional strength, transfers, functional moblity, general safety awareness  Functional  Limitation: Mobility: Walking and moving around Mobility: Walking and Moving Around Current Status [phone]): At least 60 percent but less than 80 percent impaired, limited or restricted Mobility: Walking and Moving Around Goal Status [phone]): At least 40 percent but less than 60 percent impaired, limited or restricted    Time: 0903-0935 PT Time Calculation (min) (ACUTE ONLY): 32 min   Charges:   PT Evaluation $PT Eval Low Complexity: 1 Procedure PT Treatments $Self Care/Home Management: 8-22   PT G Codes:   PT G-Codes **NOT FOR INPATIENT CLASS** Functional Assessment Tool Used: Based on skilled clinical assessment of functional strength, transfers, functional moblity, general safety awareness  Functional Limitation: Mobility: Walking and moving around Mobility: Walking and Moving Around Current Status (O1157): At least 60 percent but less than 80 percent impaired, limited or restricted Mobility: Walking and Moving Around Goal Status [phone]): At least 40 percent but less than 60 percent impaired, limited or restricted    Deniece Ree PT, DPT [phone]

## 2016-01-14 NOTE — Evaluation (Signed)
Occupational Therapy Evaluation Patient Details Name: [name] MRN: [id number] DOB: [birth year] Today's Date: 01/14/2016    History of Present Illness Pain right ankle date of injury 12/21/2015, location at home, quality of pain dull ache severity mild requiring only Tylenol timing constant worse with weightbearing associated symptoms swelling. She was treated with a Cam Walker and told to be strict nonweightbearing.   Clinical Impression   Pt working with PT on OT arrival, co-eval with PT for patient/therapist safety. Pt reports PTA daughter assisted with ADL tasks as needed including bathing and grooming tasks. Pt required min guard/min A for bed mobility, mod A x2 for sit-stand and mod/max A with consistent verbal cuing to maintain NWB on RLE. Pt is able to perform ADL tasks at bed level however will require extensive assistance to perform standing tasks while maintaining NWB precautions. Pt with poor safety awareness and recall of precautions during session. Recommend SNF on discharge to increase independence and safety during ADL and functional mobility tasks, and improve recall of precautions.     Follow Up Recommendations  SNF    Equipment Recommendations  None recommended by OT       Precautions / Restrictions Precautions Precautions: Fall;Other (comment) Precaution Comments: NWB R ankle  Restrictions Weight Bearing Restrictions: Yes RLE Weight Bearing: Non weight bearing      Mobility Bed Mobility Overal bed mobility: Needs Assistance Bed Mobility: Supine to Sit;Sit to Supine     Supine to sit: Min guard Sit to supine: Min assist   General bed mobility comments: Min assist to manage R LE, Mod-Max assist to maintain NWB R LE during bridge on bed   Transfers Overall transfer level: Needs assistance Equipment used: Standard walker Transfers: Sit to/from Stand Sit to Stand: Mod assist;+2 physical assistance;From elevated surface         General  transfer comment: +2 for safety during transfer; Mod(A) for transfer and Mod-Max(A) to maintain NWB R LE     Balance Overall balance assessment: Needs assistance;History of Falls Sitting-balance support: No upper extremity supported Sitting balance-Leahy Scale: Good     Standing balance support: Bilateral upper extremity supported Standing balance-Leahy Scale: Fair Standing balance comment: lean to L                             ADL Overall ADL's : Needs assistance/impaired         Upper Body Bathing: Set up (per pt report)   Lower Body Bathing: Set up (per pt report)                         General ADL Comments: Pt reports daughter assists with ADL tasks including set-up for bathing, daughter washes pt's hair. Pt is able to perform ADL tasks with set-up at bed level, however will require assistance to perform standing tasks, including sit-to-stand for donning LB clothing, as well as consistent cuing to maintain NWB precautions     Vision Vision Assessment?: No apparent visual deficits          Pertinent Vitals/Pain Pain Assessment: No/denies pain     Hand Dominance Right   Extremity/Trunk Assessment Upper Extremity Assessment Upper Extremity Assessment: Overall WFL for tasks assessed   Lower Extremity Assessment Lower Extremity Assessment: Generalized weakness   Cervical / Trunk Assessment Cervical / Trunk Assessment: Kyphotic   Communication Communication Communication: No difficulties   Cognition Arousal/Alertness: Awake/alert Behavior During Therapy: [facility]  for tasks assessed/performed Overall Cognitive Status: Within Functional Limits for tasks assessed       Memory: Decreased recall of precautions                        Home Living Family/patient expects to be discharged to:: Private residence Living Arrangements: Alone (daughter visits daily) Available Help at Discharge: Family;Friend(s);Available 24 hours/day Type of  Home: House Home Access: Stairs to enter [employer] of Steps: 2 Entrance Stairs-Rails: None Home Layout: One level         Biochemist, clinical: Standard     Home Equipment: Wheelchair - [employer] - 4 wheels;Cane - single point;Bedside commode          Prior Functioning/Environment Level of Independence: Independent with assistive device(s)        Comments: daughter assists with ADLs as necessary    OT Diagnosis: Generalized weakness;Acute pain   OT Problem List: Impaired balance (sitting and/or standing);Decreased activity tolerance;Decreased safety awareness;Decreased knowledge of use of DME or AE;Pain                    Co-evaluation PT/OT/SLP Co-Evaluation/Treatment: Yes Reason for Co-Treatment: For patient/therapist safety PT goals addressed during session: Mobility/safety with mobility;Proper use of DME OT goals addressed during session: ADL's and self-care      End of Session Equipment Utilized During Treatment: Gait belt;Rolling walker  Activity Tolerance: Patient tolerated treatment well Patient left: in bed;with call bell/phone within reach;with bed alarm set   Time: [id number] OT Time Calculation (min): 29 min Charges:  OT General Charges $OT Visit: 1 Procedure OT Evaluation $OT Eval Low Complexity: 1 Procedure  Guadelupe Sabin, OTR/L  [phone]  01/14/2016, 10:06 AM

## 2016-01-14 NOTE — Progress Notes (Signed)
Physical Therapy Treatment Patient Details Name: [name] MRN: [id number] DOB: December 10, 1940 Today's Date: 01/14/2016    History of Present Illness Pain right ankle date of injury 12/21/2015, location at home, quality of pain dull ache severity mild requiring only Tylenol timing constant worse with weightbearing associated symptoms swelling. She was treated with a Cam Walker and told to be strict nonweightbearing.    PT Comments    Found patient in bed, pleasant and surprised that PT is returning already; explained that due to nature of her injury/surgery, she will be seen twice a day to assist in facilitating optimal outcomes. Patient reported she was having pain about 5/10 today and that she was quite tired from session earlier this morning, so performed appropriate supine exercises for functional strengthening, including SLRs, hip abduction, heel slides (with assist to maintain NWB R LE), glut squeezes, and single leg bridges with max(A) to maintain NWB R LE. Also educated patient in several exercises she can perform in the bed safely, including glut squeezes, quad sets, and ankle DF/PF on L/toe wiggling on R; patient able to perform all 3 exercises correctly and safely in presence of PT and was educated to perform throughout the day. Did give patient a drink of water and she did appear to have mild difficulty in swallowing, had to cough for a few minutes before clearing; PT remained in room until patient appeared to be stable and safe after taking a drink. Patient left supine in bed with HOB elevated, bed alarm and SCD on, and all needs otherwise met.   Follow Up Recommendations  SNF     Equipment Recommendations  None recommended by PT    Recommendations for Other Services Speech consult     Precautions / Restrictions Precautions Precautions: Fall;Other (comment) Precaution Comments: NWB R ankle  Restrictions Weight Bearing Restrictions: Yes RLE Weight Bearing: Non weight  bearing    Cognition Arousal/Alertness: Awake/alert Behavior During Therapy: WFL for tasks assessed/performed Overall Cognitive Status: Within Functional Limits for tasks assessed       Memory: Decreased recall of precautions              Exercises General Exercises - Lower Extremity Gluteal Sets: Strengthening;Both;10 reps;Supine (2 sets ) Heel Slides: Strengthening;Both;10 reps;Supine (2 sets ) Hip ABduction/ADduction: Strengthening;Both;10 reps;Supine (2 sets ) Straight Leg Raises: Both;Strengthening;10 reps;Supine (2 sets ) Other Exercises Other Exercises: single leg partial bridge 2x5, max (A) for NWB R LE     General Comments General comments (skin integrity, edema, etc.): poor awareness of and adherence to precautions for NWB R LE      Pertinent Vitals/Pain Pain Assessment: 0-10 Pain Score: 5  Pain Intervention(s): Limited activity within patient's tolerance;Monitored during session;Repositioned    Home Living Family/patient expects to be discharged to:: Private residence Living Arrangements: Alone Available Help at Discharge: Family;Friend(s);Available 24 hours/day Type of Home: House Home Access: Stairs to enter Entrance Stairs-Rails: None Home Layout: One level Home Equipment: Wheelchair - [employer] - 4 wheels;Cane - single point;Bedside commode      Prior Function Level of Independence: Independent with assistive device(s)      Comments: daughter assists with ADLs as necessary   PT Goals (current goals can now be found in the care plan section) Acute Rehab PT Goals Patient Stated Goal: to walk  PT Goal Formulation: With patient Time For Goal Achievement: 01/28/16 Potential to Achieve Goals: Good Progress towards PT goals: Progressing toward goals    Frequency  BID    PT Plan  Current plan remains appropriate    Co-evaluation PT/OT/SLP Co-Evaluation/Treatment: Yes Reason for Co-Treatment: For patient/therapist safety PT goals addressed  during session: Mobility/safety with mobility;Proper use of DME OT goals addressed during session: ADL's and self-care     End of Session Equipment Utilized During Treatment: Gait belt Activity Tolerance: Patient tolerated treatment well Patient left: in bed;with call bell/phone within reach;with bed alarm set;with SCD's reapplied     Time: 3582-5189 PT Time Calculation (min) (ACUTE ONLY): 23 min  Charges:  $Therapeutic Exercise: 23-37 mins $Self Care/Home Management: 8-22                    G Codes:  Functional Assessment Tool Used: Based on skilled clinical assessment of functional strength, transfers, functional moblity, general safety awareness  Functional Limitation: Mobility: Walking and moving around Mobility: Walking and Moving Around Current Status (Q4210): At least 60 percent but less than 80 percent impaired, limited or restricted Mobility: Walking and Moving Around Goal Status [phone]): At least 40 percent but less than 60 percent impaired, limited or restricted   Deniece Ree PT, DPT [phone]

## 2016-01-14 NOTE — Clinical Social Work Note (Signed)
Clinical Social Work Assessment  Patient Details  Name: [name] MRN: [id number] Date of Birth: [birth year]  Date of referral:  01/14/16               Reason for consult:  Facility Placement                Permission sought to share information with:    Permission granted to share information::     Name::        Agency::     Relationship::     Contact Information:     Housing/Transportation Living arrangements for the past 2 months:  Single Family Home Source of Information:  Patient Patient Interpreter Needed:  None Criminal Activity/Legal Involvement Pertinent to Current Situation/Hospitalization:  No - Comment as needed Significant Relationships:  Adult Children Lives with:  Self Do you feel safe going back to the place where you live?  Yes Need for family participation in patient care:  Yes (Comment)  Care giving concerns:  None identified.    Social Worker assessment / plan: CSW discussed discharge planning.  Patient stated that she lives alone, uses a walker and completes ADLs unassisted.  She advised that her adult grandchildren provide assistance when needed.  Patient stated that she did not want to go to SNF and that she desired to go home with HHPT.  CSW signing off.   Employment status:  Retired Nurse, adult PT Recommendations:  [place] / Referral to community resources:  [place]  Patient/Family's Response to care:  Patient desires to go home with HHPT.  Patient/Family's Understanding of and Emotional Response to Diagnosis, Current Treatment, and Prognosis: Patient understands her diagnosis, treatment and prognosis.   Emotional Assessment Appearance:  Developmentally appropriate Attitude/Demeanor/Rapport:   (Cooperative) Affect (typically observed):  Calm Orientation:  Oriented to Situation, Oriented to  Time, Oriented to Place, Oriented to Self Alcohol / Substance use:  Not  Applicable Psych involvement (Current and /or in the community):  No (Comment)  Discharge Needs  Concerns to be addressed:  Discharge Planning Concerns Readmission within the last 30 days:  No Current discharge risk:  None Barriers to Discharge:  No Barriers Identified   Ihor Gully, LCSW 01/14/2016, 10:38 AM

## 2016-01-14 NOTE — Anesthesia Postprocedure Evaluation (Signed)
Anesthesia Post Note  Patient: [name]  Procedure(s) Performed: Procedure(s) (LRB): OPEN REDUCTION INTERNAL FIXATION (ORIF) ANKLE FRACTURE (Right)  Patient location during evaluation: Nursing Unit Anesthesia Type: Spinal Level of consciousness: awake and alert and patient cooperative Pain management: pain level controlled Vital Signs Assessment: post-procedure vital signs reviewed and stable Respiratory status: nonlabored ventilation and respiratory function stable Postop Assessment: no headache, no backache and adequate PO intake Anesthetic complications: no    Last Vitals:  Filed Vitals:   01/13/16 2141 01/14/16 0441  BP: 136/59 103/47  Pulse: 79 76  Temp: 36.8 C 37.1 C  Resp: 17 20    Last Pain:  Filed Vitals:   01/14/16 0451  PainSc: 8                  Maryana Pittmon J

## 2016-01-14 NOTE — Progress Notes (Signed)
Subjective: 1 Day Post-Op Procedure(s) (LRB): OPEN REDUCTION INTERNAL FIXATION (ORIF) ANKLE FRACTURE (Right) Patient reports pain as mild.    Objective: Vital signs in last 24 hours: Temp:  [98 F (36.7 C)-98.8 F (37.1 C)] 98.8 F (37.1 C) (03/01 0441) Pulse Rate:  [76-95] 76 (03/01 0441) Resp:  [11-20] 20 (03/01 0441) BP: (100-161)/(47-99) 103/47 mmHg (03/01 0441) SpO2:  [90 %-98 %] 98 % (03/01 0942) Weight:  [251 lb 3.2 oz (113.944 kg)] 251 lb 3.2 oz (113.944 kg) (02/28 1645)  Intake/Output from previous day: 02/28 0701 - 03/01 0700 In: 2302.7 [P.O.:121; I.V.:2131.7; IV Piggyback:50] Out: 25 [Blood:25] Intake/Output this shift: Total I/O In: 120 [P.O.:120] Out: -    Recent Labs  01/12/16 1045 01/14/16 0641  HGB 10.6* 9.1*    Recent Labs  01/12/16 1045 01/14/16 0641  WBC 5.4 8.1  RBC 4.02 3.42*  HCT 35.5* 30.7*  PLT 278 202    Recent Labs  01/12/16 1045 01/14/16 0641  NA 145 145  K 4.5 4.0  CL 104 105  CO2 31 33*  BUN 21* 17  CREATININE 0.84 0.87  GLUCOSE 90 92  CALCIUM 9.3 8.6*   No results for input(s): LABPT, INR in the last 72 hours.  Neurologically intact Neurovascular intact Sensation intact distally Compartment soft  Assessment/Plan: 1 Day Post-Op Procedure(s) (LRB): OPEN REDUCTION INTERNAL FIXATION (ORIF) ANKLE FRACTURE (Right) Up with therapy D/C IV fluids Discharge to SNF  [facility] 01/14/2016, 11:37 AM

## 2016-01-15 ENCOUNTER — Emergency Department (HOSPITAL_COMMUNITY): Payer: Medicare PPO

## 2016-01-15 ENCOUNTER — Encounter (HOSPITAL_COMMUNITY): Payer: Self-pay

## 2016-01-15 ENCOUNTER — Inpatient Hospital Stay (HOSPITAL_COMMUNITY)
Admission: EM | Admit: 2016-01-15 | Discharge: 2016-01-19 | DRG: 190 | Disposition: A | Discharge status: Skilled Nursing Facility | Payer: Medicare PPO | Attending: Internal Medicine | Admitting: Internal Medicine

## 2016-01-15 ENCOUNTER — Other Ambulatory Visit: Payer: Self-pay

## 2016-01-15 DIAGNOSIS — Z9119 Patient's noncompliance with other medical treatment and regimen: Secondary | ICD-10-CM | POA: Diagnosis not present

## 2016-01-15 DIAGNOSIS — R778 Other specified abnormalities of plasma proteins: Secondary | ICD-10-CM

## 2016-01-15 DIAGNOSIS — I248 Other forms of acute ischemic heart disease: Secondary | ICD-10-CM | POA: Diagnosis present

## 2016-01-15 DIAGNOSIS — Z87891 Personal history of nicotine dependence: Secondary | ICD-10-CM | POA: Diagnosis not present

## 2016-01-15 DIAGNOSIS — Z90710 Acquired absence of both cervix and uterus: Secondary | ICD-10-CM

## 2016-01-15 DIAGNOSIS — G4733 Obstructive sleep apnea (adult) (pediatric): Secondary | ICD-10-CM | POA: Diagnosis present

## 2016-01-15 DIAGNOSIS — K219 Gastro-esophageal reflux disease without esophagitis: Secondary | ICD-10-CM | POA: Diagnosis present

## 2016-01-15 DIAGNOSIS — J690 Pneumonitis due to inhalation of food and vomit: Secondary | ICD-10-CM | POA: Diagnosis present

## 2016-01-15 DIAGNOSIS — A599 Trichomoniasis, unspecified: Secondary | ICD-10-CM | POA: Diagnosis present

## 2016-01-15 DIAGNOSIS — D638 Anemia in other chronic diseases classified elsewhere: Secondary | ICD-10-CM | POA: Diagnosis present

## 2016-01-15 DIAGNOSIS — R296 Repeated falls: Secondary | ICD-10-CM | POA: Diagnosis present

## 2016-01-15 DIAGNOSIS — F411 Generalized anxiety disorder: Secondary | ICD-10-CM | POA: Diagnosis not present

## 2016-01-15 DIAGNOSIS — M84471D Pathological fracture, right ankle, subsequent encounter for fracture with routine healing: Secondary | ICD-10-CM | POA: Diagnosis not present

## 2016-01-15 DIAGNOSIS — F0390 Unspecified dementia without behavioral disturbance: Secondary | ICD-10-CM | POA: Diagnosis present

## 2016-01-15 DIAGNOSIS — F419 Anxiety disorder, unspecified: Secondary | ICD-10-CM | POA: Diagnosis present

## 2016-01-15 DIAGNOSIS — E785 Hyperlipidemia, unspecified: Secondary | ICD-10-CM | POA: Diagnosis present

## 2016-01-15 DIAGNOSIS — J449 Chronic obstructive pulmonary disease, unspecified: Secondary | ICD-10-CM | POA: Diagnosis not present

## 2016-01-15 DIAGNOSIS — J9601 Acute respiratory failure with hypoxia: Secondary | ICD-10-CM | POA: Diagnosis not present

## 2016-01-15 DIAGNOSIS — S82843D Displaced bimalleolar fracture of unspecified lower leg, subsequent encounter for closed fracture with routine healing: Secondary | ICD-10-CM | POA: Diagnosis not present

## 2016-01-15 DIAGNOSIS — R0682 Tachypnea, not elsewhere classified: Secondary | ICD-10-CM | POA: Diagnosis not present

## 2016-01-15 DIAGNOSIS — F329 Major depressive disorder, single episode, unspecified: Secondary | ICD-10-CM | POA: Diagnosis present

## 2016-01-15 DIAGNOSIS — Z66 Do not resuscitate: Secondary | ICD-10-CM | POA: Diagnosis present

## 2016-01-15 DIAGNOSIS — N390 Urinary tract infection, site not specified: Secondary | ICD-10-CM | POA: Diagnosis present

## 2016-01-15 DIAGNOSIS — R0602 Shortness of breath: Secondary | ICD-10-CM | POA: Diagnosis present

## 2016-01-15 DIAGNOSIS — R062 Wheezing: Secondary | ICD-10-CM | POA: Diagnosis not present

## 2016-01-15 DIAGNOSIS — Z803 Family history of malignant neoplasm of breast: Secondary | ICD-10-CM

## 2016-01-15 DIAGNOSIS — J9621 Acute and chronic respiratory failure with hypoxia: Secondary | ICD-10-CM | POA: Diagnosis present

## 2016-01-15 DIAGNOSIS — J441 Chronic obstructive pulmonary disease with (acute) exacerbation: Principal | ICD-10-CM | POA: Diagnosis present

## 2016-01-15 DIAGNOSIS — Z7982 Long term (current) use of aspirin: Secondary | ICD-10-CM

## 2016-01-15 DIAGNOSIS — R0902 Hypoxemia: Secondary | ICD-10-CM | POA: Diagnosis not present

## 2016-01-15 DIAGNOSIS — J42 Unspecified chronic bronchitis: Secondary | ICD-10-CM | POA: Diagnosis not present

## 2016-01-15 DIAGNOSIS — J9612 Chronic respiratory failure with hypercapnia: Secondary | ICD-10-CM

## 2016-01-15 DIAGNOSIS — Z800 Family history of malignant neoplasm of digestive organs: Secondary | ICD-10-CM

## 2016-01-15 DIAGNOSIS — I4581 Long QT syndrome: Secondary | ICD-10-CM | POA: Diagnosis present

## 2016-01-15 DIAGNOSIS — J9811 Atelectasis: Secondary | ICD-10-CM | POA: Diagnosis not present

## 2016-01-15 DIAGNOSIS — R7989 Other specified abnormal findings of blood chemistry: Secondary | ICD-10-CM

## 2016-01-15 DIAGNOSIS — M1990 Unspecified osteoarthritis, unspecified site: Secondary | ICD-10-CM | POA: Diagnosis not present

## 2016-01-15 DIAGNOSIS — I10 Essential (primary) hypertension: Secondary | ICD-10-CM | POA: Diagnosis present

## 2016-01-15 DIAGNOSIS — D649 Anemia, unspecified: Secondary | ICD-10-CM | POA: Diagnosis not present

## 2016-01-15 DIAGNOSIS — G9340 Encephalopathy, unspecified: Secondary | ICD-10-CM | POA: Diagnosis present

## 2016-01-15 DIAGNOSIS — R413 Other amnesia: Secondary | ICD-10-CM | POA: Diagnosis present

## 2016-01-15 DIAGNOSIS — J9611 Chronic respiratory failure with hypoxia: Secondary | ICD-10-CM | POA: Diagnosis not present

## 2016-01-15 HISTORY — DX: Do not resuscitate: Z66

## 2016-01-15 LAB — BASIC METABOLIC PANEL
Anion gap: 8 (ref 5–15)
Anion gap: 6 (ref 5–15)
BUN: 15 mg/dL (ref 6–20)
BUN: 19 mg/dL (ref 6–20)
CALCIUM: 8.4 mg/dL — AB (ref 8.9–10.3)
CALCIUM: 8.7 mg/dL — AB (ref 8.9–10.3)
CHLORIDE: 104 mmol/L (ref 101–111)
CO2: 31 mmol/L (ref 22–32)
CO2: 30 mmol/L (ref 22–32)
CREATININE: 0.97 mg/dL (ref 0.44–1.00)
Chloride: 103 mmol/L (ref 101–111)
Creatinine, Ser: 0.86 mg/dL (ref 0.44–1.00)
GFR calc Af Amer: >60 mL/min (ref >60)
GFR calc non Af Amer: 56 mL/min — ABNORMAL LOW (ref >60)
GLUCOSE: 76 mg/dL (ref 65–99)
Glucose, Bld: 144 mg/dL — ABNORMAL HIGH (ref 65–99)
Potassium: 3.8 mmol/L (ref 3.5–5.1)
Potassium: 3.9 mmol/L (ref 3.5–5.1)
SODIUM: 140 mmol/L (ref 135–145)
Sodium: 142 mmol/L (ref 135–145)

## 2016-01-15 LAB — CBC WITH DIFFERENTIAL/PLATELET
BASOS PCT: 0 %
Basophils Absolute: 0 10*3/uL (ref 0.0–0.1)
EOS ABS: 0 10*3/uL (ref 0.0–0.7)
Eosinophils Relative: 0 %
HCT: 32 % — ABNORMAL LOW (ref 36.0–46.0)
Hemoglobin: 9.6 g/dL — ABNORMAL LOW (ref 12.0–15.0)
Lymphocytes Relative: 6 %
Lymphs Abs: 0.6 10*3/uL — ABNORMAL LOW (ref 0.7–4.0)
MCH: 26.8 pg (ref 26.0–34.0)
MCHC: 30 g/dL (ref 30.0–36.0)
MCV: 89.4 fL (ref 78.0–100.0)
MONO ABS: 0.4 10*3/uL (ref 0.1–1.0)
MONOS PCT: 4 %
Neutro Abs: 8.6 10*3/uL — ABNORMAL HIGH (ref 1.7–7.7)
Neutrophils Relative %: 90 %
Platelets: 206 10*3/uL (ref 150–400)
RBC: 3.58 MIL/uL — ABNORMAL LOW (ref 3.87–5.11)
RDW: 15.1 % (ref 11.5–15.5)
WBC: 9.7 10*3/uL (ref 4.0–10.5)

## 2016-01-15 LAB — CBC
HCT: 29.7 % — ABNORMAL LOW (ref 36.0–46.0)
Hemoglobin: 8.8 g/dL — ABNORMAL LOW (ref 12.0–15.0)
MCH: 26.8 pg (ref 26.0–34.0)
MCHC: 29.6 g/dL — ABNORMAL LOW (ref 30.0–36.0)
MCV: 90.5 fL (ref 78.0–100.0)
PLATELETS: 217 10*3/uL (ref 150–400)
RBC: 3.28 MIL/uL — AB (ref 3.87–5.11)
RDW: 15.3 % (ref 11.5–15.5)
WBC: 6.9 10*3/uL (ref 4.0–10.5)

## 2016-01-15 LAB — LACTIC ACID, PLASMA: Lactic Acid, Venous: 0.9 mmol/L (ref 0.5–2.0)

## 2016-01-15 LAB — TROPONIN I: TROPONIN I: 0.06 ng/mL — AB (ref <0.031)

## 2016-01-15 LAB — BRAIN NATRIURETIC PEPTIDE: B NATRIURETIC PEPTIDE 5: 109 pg/mL — AB (ref 0.0–100.0)

## 2016-01-15 MED ORDER — METHYLPREDNISOLONE SODIUM SUCC 125 MG IJ SOLR
125.0000 mg | Freq: Once | INTRAMUSCULAR | Status: AC
  Administered 2016-01-15: 125 mg via INTRAVENOUS
  Filled 2016-01-15: qty 2

## 2016-01-15 MED ORDER — HYDROCODONE-ACETAMINOPHEN 5-325 MG PO TABS: 1.0000 | ORAL_TABLET | ORAL | Status: DC | PRN

## 2016-01-15 MED ORDER — LEVALBUTEROL HCL 0.63 MG/3ML IN NEBU
0.6300 mg | INHALATION_SOLUTION | Freq: Once | RESPIRATORY_TRACT | Status: AC
  Administered 2016-01-15: 0.63 mg via RESPIRATORY_TRACT
  Filled 2016-01-15: qty 3

## 2016-01-15 MED ORDER — ALBUTEROL SULFATE (2.5 MG/3ML) 0.083% IN NEBU
2.5000 mg | INHALATION_SOLUTION | Freq: Once | RESPIRATORY_TRACT | Status: AC
  Administered 2016-01-15: 2.5 mg via RESPIRATORY_TRACT
  Filled 2016-01-15: qty 3

## 2016-01-15 MED ORDER — POLYETHYLENE GLYCOL 3350 17 G PO PACK: 17.0000 g | PACK | Freq: Every day | ORAL | Status: DC

## 2016-01-15 MED ORDER — BISACODYL 10 MG RE SUPP: 10.0000 mg | Freq: Every day | RECTAL | Status: DC | PRN

## 2016-01-15 MED ORDER — IPRATROPIUM-ALBUTEROL 0.5-2.5 (3) MG/3ML IN SOLN
3.0000 mL | Freq: Once | RESPIRATORY_TRACT | Status: AC
  Administered 2016-01-15: 3 mL via RESPIRATORY_TRACT
  Filled 2016-01-15: qty 3

## 2016-01-15 MED ORDER — IPRATROPIUM BROMIDE 0.02 % IN SOLN
RESPIRATORY_TRACT | Status: AC
  Filled 2016-01-15: qty 5

## 2016-01-15 MED ORDER — SODIUM CHLORIDE 0.9 % IV SOLN
INTRAVENOUS | Status: DC
Start: 2016-01-15 — End: 2016-01-16
  Administered 2016-01-15: via INTRAVENOUS

## 2016-01-15 MED ORDER — IPRATROPIUM BROMIDE 0.02 % IN SOLN
1.0000 mg | Freq: Once | RESPIRATORY_TRACT | Status: DC
  Administered 2016-01-15: 1 mg via RESPIRATORY_TRACT

## 2016-01-15 MED ORDER — ASPIRIN 325 MG PO TBEC: 325.0000 mg | DELAYED_RELEASE_TABLET | Freq: Every day | ORAL | Status: DC

## 2016-01-15 MED ORDER — ALBUTEROL SULFATE (2.5 MG/3ML) 0.083% IN NEBU: 2.5000 mg | INHALATION_SOLUTION | Freq: Three times a day (TID) | RESPIRATORY_TRACT | Status: DC | PRN

## 2016-01-15 MED ORDER — IOHEXOL 350 MG/ML SOLN
100.0000 mL | Freq: Once | INTRAVENOUS | Status: AC | PRN
  Administered 2016-01-15: 100 mL via INTRAVENOUS

## 2016-01-15 MED ORDER — ALBUTEROL (5 MG/ML) CONTINUOUS INHALATION SOLN
INHALATION_SOLUTION | RESPIRATORY_TRACT | Status: AC
  Administered 2016-01-15: 10 mg/h
  Filled 2016-01-15: qty 20

## 2016-01-15 NOTE — ED Notes (Signed)
DNR is at bedside

## 2016-01-15 NOTE — Progress Notes (Signed)
Pt d/c'd to [facility].  Report called and given to nurse to assume care.  Prescription sent with patient for PRN Pain Medication.  Patient Was transported via w/c to Center by [facility] in stable condition.

## 2016-01-15 NOTE — Care Management Important Message (Signed)
Important Message  Patient Details  Name: JONIAH NAMETH MRN: [id number] Date of Birth: [birth year]   Medicare Important Message Given:  Yes    Sherald Barge, RN 01/15/2016, 11:46 AM

## 2016-01-15 NOTE — ED Notes (Signed)
Pt had right ankle surgery on the 28th here at [facility], went to [facility] today for rehab.  Staff at [facility] stated when they went to check on patient this evening found her to have decreased loc and hypoxia with sats in the 50's.  Ems found pt to have sats in the mid 80's with 4 lpm .  Pt denies pain or discomfort at this time, is awake and alert.

## 2016-01-15 NOTE — ED Notes (Signed)
Pt O2 saturation dipping to 87% for extended period of time on 5L [place]. MD McManus notified and orders for Venti mask were received.

## 2016-01-15 NOTE — Clinical Social Work Note (Signed)
CSW facilitated discharge.  CSW notified Tami and Kerri at [facility] of patient's discharge.  CSW sent discharge clinicals via [employer].  CSW left a message for patient's daughter, Quamesha Paller, and advised that patient was being discharged and transported to [facility] by [facility] hospital staff.   CSW signing off.   Ihor Gully, [place] [phone]

## 2016-01-15 NOTE — Discharge Summary (Signed)
Physician Discharge Summary  Patient ID: [name] MRN: [id number] DOB/AGE: [birth year] 75 y.o.  Admit date: 01/13/2016 Discharge date: 01/15/2016  Admission Diagnoses: Right ankle bimalleolar fracture   Discharge Diagnoses: Right ankle bimalleolar fracture  Active Problems:   Bimalleolar ankle fracture   Discharged Condition: stable  Hospital Course: Patient was admitted on the 28th for open treatment internal fixation right ankle. She had unconjugated surgery under spinal technique. She is nonweightbearing. 7 difficulty walking with therapy. She needs rehabilitation. She can't live at home by herself   Consults: None  Discharge Exam: Blood pressure 110/57, pulse 76, temperature 98 F (36.7 C), temperature source Oral, resp. rate 20, height 5\' 6"  (1.676 m), weight 251 lb 3.2 oz (113.944 kg), SpO2 96 %. The patient remains alert and awake and oriented. Her leg looks good in the splint. Her toes are mobile good color normal sensation    Disposition: 01-Home or Self Care  Discharge Instructions    Ambulatory referral to [place]    Complete by:  As directed   Please evaluate [name] for admission to [facility].  Disciplines requested: Physical Therapy  Services to provide: Strengthening Exercises, Evaluate and Other: gait training weight bearing as tolerated   Physician to follow patient's care (the person listed here will be responsible for signing ongoing orders): Referring Provider  Requested Start of Care Date: Today (Please call ahead to confirm availability)  I certify that this patient is under my care and that I, or a Nurse Practitioner or Physician's Assistant working with me, had a face-to-face encounter that meets the physician face-to-face requirements with patient on 3.2.17. The encounter with the patient was in whole, or in part for the following medical condition(s) which is the primary reason for home health care (List medical condition).  Ankle fracture right  Special Instructions:  No weight on right foot  Does the patient have Medicare or Medicaid?:  Yes  The encounter with the patient was in whole, or in part, for the following medical condition, which is the primary reason for home health care:  ankle fracture  Reason for Medically Necessary Home Health Services:  Therapy- Personnel officer, Public librarian  My clinical findings support the need for the above services:  Unable to leave home safely without assistance and/or assistive device  I certify that, based on my findings, the following services are medically necessary home health services:  Physical therapy  Further, I certify that my clinical findings support that this patient is homebound due to:  Unsafe ambulation due to balance issues            Medication List    STOP taking these medications        acetaminophen 500 MG tablet  Commonly known as:  TYLENOL     alendronate 70 MG tablet  Commonly known as:  FOSAMAX     predniSONE 5 MG tablet  Commonly known as:  DELTASONE     traMADol 50 MG tablet  Commonly known as:  ULTRAM      TAKE these medications        Albuterol Sulfate 108 (90 Base) MCG/ACT Aepb  Commonly known as:  PROAIR RESPICLICK  Inhale 2 puffs into the lungs every 8 (eight) hours as needed.     albuterol (2.5 MG/3ML) 0.083% nebulizer solution  Commonly known as:  [name]  Take 3 mLs (2.5 mg total) by nebulization every 8 (eight) hours as needed for wheezing or shortness of  breath.     arformoterol 15 MCG/2ML Nebu  Commonly known as:  BROVANA  Take 2 mLs (15 mcg total) by nebulization 2 (two) times daily.     aspirin 325 MG EC tablet  Take 1 tablet (325 mg total) by mouth daily with breakfast.     atorvastatin 10 MG tablet  Commonly known as:  LIPITOR  Take 1 tablet (10 mg total) by mouth daily.     bisacodyl 10 MG suppository  Commonly known as:  DULCOLAX  Place 1 suppository (10 mg total) rectally  daily as needed for moderate constipation.     calcium-vitamin D 500-200 MG-UNIT tablet  Commonly known as:  OSCAL WITH D  Take 1 tablet by mouth daily.     CEROVITE SENIOR Tabs  Take 1 tablet by mouth daily.     donepezil 10 MG tablet  Commonly known as:  ARICEPT  Take 1 tablet (10 mg total) by mouth at bedtime.     FLUoxetine 40 MG capsule  Commonly known as:  PROZAC  Take 1 capsule (40 mg total) by mouth daily.     HYDROcodone-acetaminophen 5-325 MG tablet  Commonly known as:  NORCO  Take 1 tablet by mouth every 4 (four) hours as needed for moderate pain.     loperamide 2 MG capsule  Commonly known as:  IMODIUM  Take 2 mg by mouth daily.     loratadine 10 MG tablet  Commonly known as:  CLARITIN  Take 10 mg by mouth daily.     omeprazole 40 MG capsule  Commonly known as:  PRILOSEC  Take 1 capsule (40 mg total) by mouth daily.     polyethylene glycol packet  Commonly known as:  MIRALAX / GLYCOLAX  Take 17 g by mouth daily.     verapamil 240 MG CR tablet  Commonly known as:  CALAN-SR  Take 1 tablet (240 mg total) by mouth at bedtime.     Wheelchair Misc  Use as needed           Follow-up Information    Follow up with Arther Abbott, MD On 01/29/2016.   Specialties:  Orthopedic Surgery, Radiology   Why:  For wound re-check, xrays and cast    Contact information:   [address] [place] [address] [phone]       Signed: Arther Abbott 01/15/2016, 8:30 AM

## 2016-01-15 NOTE — ED Provider Notes (Signed)
CSN: [id number]     Arrival date & time 01/15/16  2105 History   First MD Initiated Contact with Patient 01/15/16 2136     Chief Complaint  Patient presents with  . Shortness of Breath      The history is provided by the EMS personnel, the nursing home and the patient. The history is limited by the condition of the patient (Hx dementia).  Pt was seen at 2135.   Per EMS, NH report and pt: NH states when they checked on pt this evening she was found with decreased LOC and hypoxic, with O2 Sats "in the 50's." EMS placed O2 4L N/C, with O2 Sats increasing to mid-80's. Pt s/p right ankle fx repair on 01/13/16. Pt herself has significant hx of dementia and does not know why she has come to the ED tonight.   Past Medical History  Diagnosis Date  . Allergic rhinitis   . Anemia   . Anxiety   . Depression   . GERD (gastroesophageal reflux disease)   . Hypertension   . Low back pain   . Arthritis     abnormal gait   . OSA on CPAP   . Degenerative disc disease, lumbar     L5 nerve impingement   . 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 Arm fracture, left   . Onychomycosis   . Ankle fracture, right   . Hyperglycemia   . Mediastinal lymphadenopathy 1/9    resolving   . COPD (chronic obstructive pulmonary disease) (HCC)     chronic CO2 retention, decreased DLCO   . Cystic acne     adult   . Sleep apnea     stop bang score 6  . History of colon surgery     right hemicolectomy for high-grade adenomas in right colon 2013  . Abnormality of gait 06/06/2014  . Memory difficulty 09/20/2014    mild dementia  . DNR (do not resuscitate)    Past Surgical History  Procedure Laterality Date  . Abdominal hysterectomy  1976    secondary to bleeding   . Oophorectomy  1976  . Epidural steroids    . Orif ankle fracture  01/18/2012    Procedure: OPEN REDUCTION INTERNAL FIXATION (ORIF) ANKLE FRACTURE;  Surgeon: Arther Abbott, MD;  Location: [facility];  Service: Orthopedics;  Laterality:  Left;  . Colonoscopy  07/20/2012    Dr. Gala Romney: multiple colonic polyps with large polyps on right side, s/p saline-assisted debulking piecemeal polypectomy and ablation. Not all removed. Path with tubulovillous adenomas, high grade dysplasia.   [name] Appendectomy    . Colon resection  08/18/2012    Procedure: HAND ASSISTED LAPAROSCOPIC COLON RESECTION;  Surgeon: Jamesetta So, MD;  Location: [facility];  Service: General;  Laterality: N/A;  . Colonoscopy N/A 03/09/2013    [id number] polyps-tubular adenomas. S/p right hemicolectomy. next tcs 02/2018  . Esophagogastroduodenoscopy (egd) with esophageal dilation N/A 02/06/2014    Procedure: ESOPHAGOGASTRODUODENOSCOPY (EGD) WITH ESOPHAGEAL DILATION;  Surgeon: Daneil Dolin, MD;  Location: [facility];  Service: Endoscopy;  Laterality: N/A;  9:30  . Orif ankle fracture Right 01/13/2016    Procedure: OPEN REDUCTION INTERNAL FIXATION (ORIF) ANKLE FRACTURE;  Surgeon: Carole Civil, MD;  Location: [facility];  Service: Orthopedics;  Laterality: Right;   Family History  Problem Relation Age of Onset  . Stomach cancer Father   . Cancer Father   . Cancer Mother     brain   .  Breast cancer Mother   . Arthritis     Social History  Substance Use Topics  . Smoking status: Former Smoker -- 1.50 packs/day for 40 years    Types: Cigarettes    Quit date: 06/21/2006  . Smokeless tobacco: Never Used  . Alcohol Use: No   OB History    Gravida Para Term Preterm AB TAB SAB Ectopic Multiple Living   5 4 4  1  1         Review of Systems  Unable to perform ROS: Dementia      Allergies  Fenofibrate; Pravastatin sodium; Sulfonamide derivatives; and Ciprofloxacin  Home Medications   Prior to Admission medications   Medication Sig Start Date End Date Taking? Authorizing Provider  albuterol ([name]) (2.5 MG/3ML) 0.083% nebulizer solution Take 3 mLs (2.5 mg total) by nebulization every 8 (eight) hours as needed for wheezing or shortness of breath. 01/15/16  Yes  Carole Civil, MD  Albuterol Sulfate (PROAIR RESPICLICK) [id number] (90 BASE) MCG/ACT AEPB Inhale 2 puffs into the lungs every 8 (eight) hours as needed. 11/11/15  Yes [name], MD  arformoterol (BROVANA) 15 MCG/2ML NEBU Take 2 mLs (15 mcg total) by nebulization 2 (two) times daily. 04/25/12  Yes [name], MD  aspirin EC 325 MG EC tablet Take 1 tablet (325 mg total) by mouth daily with breakfast. 01/15/16  Yes Carole Civil, MD  atorvastatin (LIPITOR) 10 MG tablet Take 1 tablet (10 mg total) by mouth daily. 12/15/15  Yes [name], MD  bisacodyl (DULCOLAX) 10 MG suppository Place 1 suppository (10 mg total) rectally daily as needed for moderate constipation. 01/15/16  Yes Carole Civil, MD  calcium-vitamin D (OSCAL WITH D) 500-200 MG-UNIT per tablet Take 1 tablet by mouth daily.    Yes Historical Provider, MD  donepezil (ARICEPT) 10 MG tablet Take 1 tablet (10 mg total) by mouth at bedtime. 12/15/15  Yes [name], MD  FLUoxetine (PROZAC) 40 MG capsule Take 1 capsule (40 mg total) by mouth daily. 12/15/15  Yes [name], MD  HYDROcodone-acetaminophen (NORCO) 5-325 MG tablet Take 1 tablet by mouth every 4 (four) hours as needed for moderate pain. 01/15/16  Yes Carole Civil, MD  loperamide (IMODIUM) 2 MG capsule Take 2 mg by mouth daily.   Yes Historical Provider, MD  loratadine (CLARITIN) 10 MG tablet Take 10 mg by mouth daily.   Yes Historical Provider, MD  Multiple Vitamins-Minerals (CEROVITE SENIOR) TABS Take 1 tablet by mouth daily.    Yes Historical Provider, MD  omeprazole (PRILOSEC) 40 MG capsule Take 1 capsule (40 mg total) by mouth daily. 12/15/15  Yes [name], MD  polyethylene glycol [facility] / GLYCOLAX) packet Take 17 g by mouth daily. 01/15/16  Yes Carole Civil, MD  verapamil (CALAN-SR) 240 MG CR tablet Take 1 tablet (240 mg total) by mouth at bedtime. 12/15/15  Yes [name], MD  Misc. Devices [facility]) MISC Use  as needed 12/21/15   Orpah Greek, MD   BP 135/75 mmHg  Pulse 107  Temp(Src) 99.8 F (37.7 C) (Oral)  Resp 16  Ht 5\' 6"  (1.676 m)  Wt 251 lb (113.853 kg)  BMI 40.53 kg/m2  SpO2 88% Physical Exam  2140: Physical examination:  Nursing notes reviewed; Vital signs and O2 SAT reviewed;  Constitutional: Well developed, Well nourished, Well hydrated, Uncomfortable appearing; Head:  Normocephalic, atraumatic; Eyes: EOMI, PERRL, No scleral icterus; ENMT: Mouth and pharynx normal,  Mucous membranes moist; Neck: Supple, Full range of motion, No lymphadenopathy; Cardiovascular: Tachycardic rate and rhythm, No gallop; Respiratory: Breath sounds diminished & equal bilaterally, faint scattered wheezes. Speaking phrases. Sitting upright, tachypneic.; Chest: Nontender, Movement normal; Abdomen: Soft, Nontender, Nondistended, Normal bowel sounds; Genitourinary: No CVA tenderness; Extremities: Pulses normal, +brace on RLE. No edema, No deformity.; Neuro: Awake, alert, confused re: events. Major CN grossly intact. No facial droop. Speech clear. +RLE in brace, otherwise moves all extremities spontaneously without apparent gross focal motor deficits.; Skin: Color normal, Warm, Dry.   ED Course  Procedures (including critical care time) Labs Review  Imaging Review  I have personally reviewed and evaluated these images and lab results as part of my medical decision-making.   EKG Interpretation None      MDM  MDM Reviewed: previous chart, nursing note and vitals Reviewed previous: labs and ECG Interpretation: labs, ECG, x-ray and CT scan Total time providing critical care: 30-74 minutes. This excludes time spent performing separately reportable procedures and services. Consults: admitting MD   CRITICAL CARE Performed by: Alfonzo Feller Total critical care time: 35 minutes Critical care time was exclusive of separately billable procedures and treating other patients. Critical care was  necessary to treat or prevent imminent or life-threatening deterioration. Critical care was time spent personally by me on the following activities: development of treatment plan with patient and/or surrogate as well as nursing, discussions with consultants, evaluation of patient's response to treatment, examination of patient, obtaining history from patient or surrogate, ordering and performing treatments and interventions, ordering and review of laboratory studies, ordering and review of radiographic studies, pulse oximetry and re-evaluation of patient's condition.   ED ECG REPORT   Date: 01/15/2016  Rate: 108  Rhythm: sinus tachycardia  QRS Axis: normal  Intervals: QT prolonged  ST/T Wave abnormalities: nonspecific ST/T changes  Conduction Disutrbances:none  Narrative Interpretation:   Old EKG Reviewed: changes noted; new TWA anterior leads and QT prolongation compared to previous EKG dated 01/12/2016.    Results for orders placed or performed during the hospital encounter of [id number]  Basic metabolic panel  Result Value Ref Range   Sodium 140 135 - 145 mmol/L   Potassium 3.9 3.5 - 5.1 mmol/L   Chloride 104 101 - 111 mmol/L   CO2 30 22 - 32 mmol/L   Glucose, Bld 144 (H) 65 - 99 mg/dL   BUN 19 6 - 20 mg/dL   Creatinine, Ser 0.97 0.44 - 1.00 mg/dL   Calcium 8.7 (L) 8.9 - 10.3 mg/dL   GFR calc non Af Amer 56 (L) >60 mL/min   GFR calc Af Amer >60 >60 mL/min   Anion gap 6 5 - 15  Troponin I  Result Value Ref Range   Troponin I 0.06 (H) <0.031 ng/mL  Brain natriuretic peptide  Result Value Ref Range   B Natriuretic Peptide 109.0 (H) 0.0 - 100.0 pg/mL  Lactic acid, plasma  Result Value Ref Range   Lactic Acid, Venous 0.9 0.5 - 2.0 mmol/L  CBC with Differential  Result Value Ref Range   WBC 9.7 4.0 - 10.5 K/uL   RBC 3.58 (L) 3.87 - 5.11 MIL/uL   Hemoglobin 9.6 (L) 12.0 - 15.0 g/dL   HCT 32.0 (L) 36.0 - 46.0 %   MCV 89.4 78.0 - 100.0 fL   MCH 26.8 26.0 - 34.0 pg   MCHC 30.0  30.0 - 36.0 g/dL   RDW 15.1 11.5 - 15.5 %   Platelets 206 150 - 400  K/uL   Neutrophils Relative % 90 %   Neutro Abs 8.6 (H) 1.7 - 7.7 K/uL   Lymphocytes Relative 6 %   Lymphs Abs 0.6 (L) 0.7 - 4.0 K/uL   Monocytes Relative 4 %   Monocytes Absolute 0.4 0.1 - 1.0 K/uL   Eosinophils Relative 0 %   Eosinophils Absolute 0.0 0.0 - 0.7 K/uL   Basophils Relative 0 %   Basophils Absolute 0.0 0.0 - 0.1 K/uL    Ct Angio Chest Pe W/cm &/or Wo Cm 01/15/2016  CLINICAL DATA:  75 year old female with shortness of breath. Recent ankle surgery for fracture. EXAM: CT ANGIOGRAPHY CHEST WITH CONTRAST TECHNIQUE: Multidetector CT imaging of the chest was performed using the standard protocol during bolus administration of intravenous contrast. Multiplanar CT image reconstructions and MIPs were obtained to evaluate the vascular anatomy. CONTRAST:  167mL OMNIPAQUE IOHEXOL 350 MG/ML SOLN COMPARISON:  Radiograph dated 01/15/2016 FINDINGS: There is mild emphysematous changes of the lungs. Bibasilar subpleural subsegmental atelectatic changes noted. There is no pleural effusion or pneumothorax. The central airways are patent. There is atherosclerotic calcification of the thoracic aorta. No CT evidence of pulmonary embolism. Mild cardiomegaly. No pericardial effusion. There is coronary vascular calcification. There are bilateral hilar adenopathy. Small amount of fluid noted within the esophagus. There is no axillary adenopathy. The chest wall soft tissues appear unremarkable. There is degenerative changes of the spine. No acute fracture. The visualized upper abdomen appears unremarkable. Review of the MIP images confirms the above findings. IMPRESSION: No CT evidence of pulmonary embolism. Bibasilar subsegmental atelectatic changes. Pneumonia is less likely. Mildly enlarged bilateral hilar lymph nodes may be reactive. Clinical correlation is recommended. Electronically Signed   By: Anner Crete M.D.   On: 01/15/2016 23:42    Dg Chest Portable 1 View  01/15/2016  CLINICAL DATA:  Acute onset of decreased level of consciousness. Hypoxia and decreased O2 saturation. Status post recent ankle surgery. Initial encounter. EXAM: PORTABLE CHEST 1 VIEW COMPARISON:  Chest radiograph performed 06/22/2014 FINDINGS: The lungs are well-aerated. Mild vascular congestion is noted. Minimal bibasilar atelectasis is seen. There is no evidence of pleural effusion or pneumothorax. The cardiomediastinal silhouette is mildly enlarged. No acute osseous abnormalities are seen. IMPRESSION: Mild vascular congestion and mild cardiomegaly. Minimal bibasilar atelectasis seen. Electronically Signed   By: Garald Balding M.D.   On: 01/15/2016 21:51    0015:   On arrival: pt sitting upright, tachypneic, tachycardic, Sats 88 % R/A, lungs diminished. IV solumedrol and hour long neb started. After neb: pt appears more comfortable at rest, less tachypneic, Sats fluctuating 88-92 % on O2 4L N/C, lungs continue diminished. Will re-dose short neb and place on venti mask. Dx and testing d/w pt and family.  Questions answered.  Verb understanding, agreeable to admit.  T/C to [facility]. [place] Comment, case discussed, including:  HPI, pertinent PM/SHx, VS/PE, dx testing, ED course and treatment:  Agreeable to admit, requests he will come to the ED for evaluation.   Francine Graven, DO 01/18/16 1526

## 2016-01-15 NOTE — Care Management Note (Signed)
Case Management Note  Patient Details  Name: [name] MRN: [id number] Date of Birth: 12/13/1940  Subjective/Objective:                  Pt is form home, lives by herself and is ind with ADL's at baseline. Pt has wheelchair and home O2 prior to admission. Pt initially refused SNF. Discussed support needs pt will need in place before discharging home, and pt now agreeable with SNF. Per pt's wishes, DC plan discussed with pt's daughter over the phone. CSW made aware and will see to arrange for placement.   Action/Plan: No CM needs.   Expected Discharge Date:    01/15/2016             Expected Discharge Plan:  Skilled Nursing Facility  In-House Referral:  Clinical Social Work  Discharge planning Services  NA  Post Acute Care Choice:  NA Choice offered to:  NA  DME Arranged:    DME Agency:     HH Arranged:    [place] Agency:     Status of Service:  Completed, signed off  Medicare Important Message Given:  Yes Date Medicare IM Given:    Medicare IM give by:    Date Additional Medicare IM Given:    Additional Medicare Important Message give by:     If discussed at [place] of Stay Meetings, dates discussed:    Additional Comments:  Sherald Barge, RN 01/15/2016, 11:49 AM

## 2016-01-15 NOTE — Progress Notes (Signed)
Patient requests discharge to home against advice

## 2016-01-15 NOTE — Clinical Social Work Placement (Signed)
   CLINICAL SOCIAL WORK PLACEMENT  NOTE  Date:  01/15/2016  Patient Details  Name: [name] MRN: [id number] Date of Birth: 1941-06-21  Clinical Social Work is seeking post-discharge placement for this patient at the [place] level of care (*CSW will initial, date and re-position this form in  chart as items are completed):  Yes   Patient/family provided with [place] Work Department's list of facilities offering this level of care within the geographic area requested by the patient (or if unable, by the patient's family).  Yes   Patient/family informed of their freedom to choose among providers that offer the needed level of care, that participate in Medicare, Medicaid or managed care program needed by the patient, have an available bed and are willing to accept the patient.  Yes   Patient/family informed of [facility]'s ownership interest in [facility] and [facility], as well as of the fact that they are under no obligation to receive care at these facilities.  PASRR submitted to EDS on 01/15/16     PASRR number received on       Existing PASRR number confirmed on 01/15/16     FL2 transmitted to all facilities in geographic area requested by pt/family on 01/15/16     FL2 transmitted to all facilities within larger geographic area on       Patient informed that his/her managed care company has contracts with or will negotiate with certain facilities, including the following:            Patient/family informed of bed offers received.  Patient chooses bed at [facility]     Physician recommends and patient chooses bed at      Patient to be transferred to [facility] on 01/15/16.  Patient to be transferred to facility by [facility] staff     Patient family notified on 01/15/16 of transfer.  Name of family member notified:  Voicemail message left for patient's daughter.     PHYSICIAN       Additional Comment:     _______________________________________________ Ihor Gully, LCSW 01/15/2016, 5:23 PM

## 2016-01-15 NOTE — Op Note (Signed)
Surgery date 2/28  Dx right ankle bimalleolar fracture of the right ankle   Surgeon Aline Brochure  Assisted by : none  Anesthesia Spinal  Findings: displaced bimalleolar fracture of the right ankle  Implants Stryker lateral and medial plates and one additional medial screw  Primary indication unstable right ankle bimalleolar fracture which slipped during nonoperative treatment in a cast brace  No complications were noted  Minimal blood loss under tourniquet control  Posterior splint applied at end of surgery  The surgery was done as follows  The patient was identified in the preop area the surgical site was marked the chart was reviewed and updated and the patient was taken to surgery  She had spinal anesthesia no complications there.  She was supine on the bed a bump was placed in the right hip right leg was prepped and draped with Betadine secondary to some old blisters which were resolved  Appropriate antibiotics were given IV  Timeout was completed  Tourniquet was elevated after exsanguination of the limb with a six-inch Esmarch  Lateral incision was made in the midline of the fibula and taken down to bone subperiosteal dissection expose the fracture fracture was reduced and held with a clamp and a radiograph confirmed reduction. Slight manipulations were done and repeat radiographs were done to confirm anatomic mortise reduction as well as anatomic fibular reduction. Lateral plate was applied using AO technique  We then turned our attention to the medial side we made a straight incision over the medial malleolus and medial tibia we also took this down to bone we opened up the fracture site irrigated including the joint. We applied a clamp took an x-ray and the fracture was reduced we then placed 2-4.0 screws but I was unhappy with the fixation and decided to remove one of the screws in place a lateral buttress plate as the fracture was more transverse. This gave excellent  fixation.  We irrigated both wounds and closed the medial side with 2- 0 Monocryl and staples to close the lateral side with 0 Monocryl and staples  We injected the areas of the incisions with 60 mL of Marcaine with epi  We applied a posterior splint after were applied sterile dressings keeping the foot in neutral position  The tourniquet was released. The patient was taken to recovery room in stable condition  Postoperative plan Nonweightbearing for probably 12 weeks Staples out at 2 weeks with x-ray followed by cast Second x-ray at 6 weeks, cast or brace can be applied at that time Third x-ray at 12 weeks Once adequate fracture healing is confirmed we can start weightbearing probably in a protective brace The patient agreed prior to surgery to go to rehabilitation

## 2016-01-15 NOTE — NC FL2 (Signed)
[place] LEVEL OF CARE SCREENING TOOL     IDENTIFICATION  Patient Name: [name] Birthdate: October 07, 1941 Sex: female Admission Date (Current Location): 01/13/2016  [facility] and [place] Number:  [employer] and Address:  [facility] [address], [place]      Provider Number: [id number]  Attending Physician Name and Address:  Carole Civil, MD  Relative Name and Phone Number:       Current Level of Care: Hospital Recommended Level of Care: [place] Prior Approval Number:    Date Approved/Denied:   PASRR Number:  ([id number])  Discharge Plan: SNF    Current Diagnoses: Patient Active Problem List   Diagnosis Date Noted  . Bimalleolar ankle fracture 01/13/2016  . Medicare annual wellness visit, subsequent 08/13/2015  . Memory difficulty 09/20/2014  . Abnormality of gait 06/06/2014  . Cervical neck pain with evidence of disc disease 05/20/2014  . COPD (chronic obstructive pulmonary disease) ([place]) 12/21/2013  . Multiple adenomatous polyps 02/15/2013  . Spinal stenosis 09/29/2011  . Falls frequently 09/01/2011  . ALLERGIC RHINITIS, SEASONAL 04/10/2010  . DYSPNEA 08/22/2007  . Hyperlipidemia LDL goal <100 01/25/2007  . ANEMIA-NOS 10/05/2006  . DEPRESSION 10/05/2006  . OBSTRUCTIVE SLEEP APNEA 10/05/2006  . Essential hypertension 10/05/2006  . GERD 10/05/2006  . Generalized osteoarthritis of multiple sites 10/05/2006    Orientation RESPIRATION BLADDER Height & Weight     Self, Time, Situation, Place  O2 (3L) Continent Weight: 251 lb 3.2 oz (113.944 kg) Height:  5\' 6"  (167.6 cm)  BEHAVIORAL SYMPTOMS/MOOD NEUROLOGICAL BOWEL NUTRITION STATUS      Continent Diet (low sodium/heart healthy)  AMBULATORY STATUS COMMUNICATION OF NEEDS Skin   Extensive Assist Verbally Surgical wounds (Left ankle)                       Personal Care Assistance Level of Assistance  Bathing, Dressing  Bathing Assistance: Maximum assistance   Dressing Assistance: Maximum assistance     Functional Limitations Info             SPECIAL CARE FACTORS FREQUENCY  PT (By licensed PT)     PT Frequency:  (5x/week)              Contractures      Additional Factors Info  Allergies   Allergies Info:  (Fenofibrate, Pravastatin Sodium, Sulfonamide Derivatives, Ciprofloxacin)           Current Medications (01/15/2016):  This is the current hospital active medication list Current Facility-Administered Medications  Medication Dose Route Frequency Provider Last Rate Last Dose  . acetaminophen (TYLENOL) tablet 650 mg  650 mg Oral Q6H PRN Carole Civil, MD       Or  . acetaminophen (TYLENOL) suppository 650 mg  650 mg Rectal Q6H PRN Carole Civil, MD      . albuterol ([name]) (2.5 MG/3ML) 0.083% nebulizer solution 2.5 mg  2.5 mg Nebulization Q8H PRN Carole Civil, MD      . arformoterol [facility]) nebulizer solution 15 mcg  15 mcg Nebulization BID Carole Civil, MD   15 mcg at 01/14/16 2034  . aspirin EC tablet 325 mg  325 mg Oral Q breakfast Carole Civil, MD   325 mg at 01/15/16 0831  . atorvastatin (LIPITOR) tablet 10 mg  10 mg Oral Daily Carole Civil, MD   10 mg at 01/15/16 0831  . bisacodyl (DULCOLAX) suppository 10 mg  10 mg Rectal Daily PRN Carole Civil, MD      . calcium-vitamin D (OSCAL WITH D) 500-200 MG-UNIT per tablet 1 tablet  1 tablet Oral Daily Carole Civil, MD   1 tablet at 01/15/16 [phone]  . docusate sodium (COLACE) capsule 100 mg  100 mg Oral BID Carole Civil, MD   100 mg at 01/15/16 0831  . donepezil (ARICEPT) tablet 10 mg  10 mg Oral QHS Carole Civil, MD   10 mg at 01/14/16 2152  . FLUoxetine (PROZAC) capsule 40 mg  40 mg Oral Daily Carole Civil, MD   40 mg at 01/15/16 0831  . HYDROcodone-acetaminophen (NORCO/VICODIN) 5-325 MG per tablet 1-2 tablet  1-2 tablet Oral Q6H PRN Carole Civil, MD   2 tablet at  01/15/16 0344  . loperamide (IMODIUM) capsule 2 mg  2 mg Oral Daily Carole Civil, MD   2 mg at 01/15/16 [id number]  . loratadine (CLARITIN) tablet 10 mg  10 mg Oral Daily Carole Civil, MD   10 mg at 01/15/16 [id number]  . menthol-cetylpyridinium (CEPACOL) lozenge 3 mg  1 lozenge Oral PRN Carole Civil, MD       Or  . phenol (CHLORASEPTIC) mouth spray 1 spray  1 spray Mouth/Throat PRN Carole Civil, MD      . metoCLOPramide (REGLAN) tablet 5-10 mg  5-10 mg Oral Q8H PRN Carole Civil, MD       Or  . metoCLOPramide (REGLAN) injection 5-10 mg  5-10 mg Intravenous Q8H PRN Carole Civil, MD      . multivitamin with minerals tablet 1 tablet  1 tablet Oral Daily Carole Civil, MD   1 tablet at 01/15/16 0831  . ondansetron (ZOFRAN) tablet 4 mg  4 mg Oral Q6H PRN Carole Civil, MD   4 mg at 01/14/16 1443   Or  . ondansetron (ZOFRAN) injection 4 mg  4 mg Intravenous Q6H PRN Carole Civil, MD   4 mg at 01/14/16 0101  . pantoprazole (PROTONIX) EC tablet 40 mg  40 mg Oral Daily Carole Civil, MD   40 mg at 01/15/16 0831  . polyethylene glycol (MIRALAX / GLYCOLAX) packet 17 g  17 g Oral Daily Carole Civil, MD   17 g at 01/15/16 [phone]  . verapamil (CALAN-SR) CR tablet 240 mg  240 mg Oral QHS Carole Civil, MD   240 mg at 01/14/16 2152     Discharge Medications: Please see discharge summary for a list of discharge medications.  Relevant Imaging Results:  Relevant Lab Results:   Additional Information    Anajulia Leyendecker, Clydene Pugh, LCSW

## 2016-01-16 ENCOUNTER — Encounter (HOSPITAL_COMMUNITY): Payer: Self-pay | Admitting: *Deleted

## 2016-01-16 DIAGNOSIS — Z90710 Acquired absence of both cervix and uterus: Secondary | ICD-10-CM | POA: Diagnosis not present

## 2016-01-16 DIAGNOSIS — J9611 Chronic respiratory failure with hypoxia: Secondary | ICD-10-CM

## 2016-01-16 DIAGNOSIS — J441 Chronic obstructive pulmonary disease with (acute) exacerbation: Secondary | ICD-10-CM | POA: Diagnosis present

## 2016-01-16 DIAGNOSIS — N390 Urinary tract infection, site not specified: Secondary | ICD-10-CM | POA: Diagnosis present

## 2016-01-16 DIAGNOSIS — J9601 Acute respiratory failure with hypoxia: Secondary | ICD-10-CM | POA: Diagnosis not present

## 2016-01-16 DIAGNOSIS — R296 Repeated falls: Secondary | ICD-10-CM | POA: Diagnosis not present

## 2016-01-16 DIAGNOSIS — A599 Trichomoniasis, unspecified: Secondary | ICD-10-CM | POA: Diagnosis present

## 2016-01-16 DIAGNOSIS — Z7982 Long term (current) use of aspirin: Secondary | ICD-10-CM | POA: Diagnosis not present

## 2016-01-16 DIAGNOSIS — G4733 Obstructive sleep apnea (adult) (pediatric): Secondary | ICD-10-CM | POA: Diagnosis present

## 2016-01-16 DIAGNOSIS — J9612 Chronic respiratory failure with hypercapnia: Secondary | ICD-10-CM

## 2016-01-16 DIAGNOSIS — Z803 Family history of malignant neoplasm of breast: Secondary | ICD-10-CM | POA: Diagnosis not present

## 2016-01-16 DIAGNOSIS — F419 Anxiety disorder, unspecified: Secondary | ICD-10-CM | POA: Diagnosis present

## 2016-01-16 DIAGNOSIS — F329 Major depressive disorder, single episode, unspecified: Secondary | ICD-10-CM | POA: Diagnosis present

## 2016-01-16 DIAGNOSIS — Z9119 Patient's noncompliance with other medical treatment and regimen: Secondary | ICD-10-CM | POA: Diagnosis not present

## 2016-01-16 DIAGNOSIS — I4581 Long QT syndrome: Secondary | ICD-10-CM | POA: Diagnosis present

## 2016-01-16 DIAGNOSIS — E785 Hyperlipidemia, unspecified: Secondary | ICD-10-CM | POA: Diagnosis present

## 2016-01-16 DIAGNOSIS — I10 Essential (primary) hypertension: Secondary | ICD-10-CM | POA: Diagnosis not present

## 2016-01-16 DIAGNOSIS — Z66 Do not resuscitate: Secondary | ICD-10-CM | POA: Diagnosis present

## 2016-01-16 DIAGNOSIS — D638 Anemia in other chronic diseases classified elsewhere: Secondary | ICD-10-CM | POA: Diagnosis present

## 2016-01-16 DIAGNOSIS — J42 Unspecified chronic bronchitis: Secondary | ICD-10-CM | POA: Diagnosis not present

## 2016-01-16 DIAGNOSIS — R0602 Shortness of breath: Secondary | ICD-10-CM | POA: Diagnosis present

## 2016-01-16 DIAGNOSIS — I248 Other forms of acute ischemic heart disease: Secondary | ICD-10-CM | POA: Diagnosis present

## 2016-01-16 DIAGNOSIS — Z87891 Personal history of nicotine dependence: Secondary | ICD-10-CM | POA: Diagnosis not present

## 2016-01-16 DIAGNOSIS — J690 Pneumonitis due to inhalation of food and vomit: Secondary | ICD-10-CM | POA: Diagnosis present

## 2016-01-16 DIAGNOSIS — G9340 Encephalopathy, unspecified: Secondary | ICD-10-CM | POA: Diagnosis present

## 2016-01-16 DIAGNOSIS — Z800 Family history of malignant neoplasm of digestive organs: Secondary | ICD-10-CM | POA: Diagnosis not present

## 2016-01-16 DIAGNOSIS — K219 Gastro-esophageal reflux disease without esophagitis: Secondary | ICD-10-CM | POA: Diagnosis present

## 2016-01-16 DIAGNOSIS — J9621 Acute and chronic respiratory failure with hypoxia: Secondary | ICD-10-CM | POA: Diagnosis present

## 2016-01-16 DIAGNOSIS — F0390 Unspecified dementia without behavioral disturbance: Secondary | ICD-10-CM | POA: Diagnosis present

## 2016-01-16 LAB — COMPREHENSIVE METABOLIC PANEL
ALBUMIN: 3.3 g/dL — AB (ref 3.5–5.0)
ALK PHOS: 92 U/L (ref 38–126)
ALT: 17 U/L (ref 14–54)
AST: 40 U/L (ref 15–41)
Anion gap: 9 (ref 5–15)
BILIRUBIN TOTAL: 0.4 mg/dL (ref 0.3–1.2)
BUN: 19 mg/dL (ref 6–20)
CALCIUM: 8.8 mg/dL — AB (ref 8.9–10.3)
CO2: 31 mmol/L (ref 22–32)
CREATININE: 0.88 mg/dL (ref 0.44–1.00)
Chloride: 103 mmol/L (ref 101–111)
GFR calc Af Amer: >60 mL/min (ref >60)
GFR calc non Af Amer: >60 mL/min (ref >60)
GLUCOSE: 153 mg/dL — AB (ref 65–99)
Potassium: 4 mmol/L (ref 3.5–5.1)
SODIUM: 143 mmol/L (ref 135–145)
TOTAL PROTEIN: 6.7 g/dL (ref 6.5–8.1)

## 2016-01-16 LAB — URINALYSIS, ROUTINE W REFLEX MICROSCOPIC
BILIRUBIN URINE: NEGATIVE
Glucose, UA: NEGATIVE mg/dL
Hgb urine dipstick: NEGATIVE
KETONES UR: 15 mg/dL — AB
NITRITE: NEGATIVE
PH: 6 (ref 5.0–8.0)
PROTEIN: 30 mg/dL — AB
Specific Gravity, Urine: 1.015 (ref 1.005–1.030)

## 2016-01-16 LAB — CBC
HCT: 30.8 % — ABNORMAL LOW (ref 36.0–46.0)
HEMOGLOBIN: 9.3 g/dL — AB (ref 12.0–15.0)
MCH: 27 pg (ref 26.0–34.0)
MCHC: 30.2 g/dL (ref 30.0–36.0)
MCV: 89.3 fL (ref 78.0–100.0)
PLATELETS: 219 10*3/uL (ref 150–400)
RBC: 3.45 MIL/uL — ABNORMAL LOW (ref 3.87–5.11)
RDW: 15.1 % (ref 11.5–15.5)
WBC: 9.2 10*3/uL (ref 4.0–10.5)

## 2016-01-16 LAB — URINE MICROSCOPIC-ADD ON

## 2016-01-16 LAB — TROPONIN I: TROPONIN I: 0.04 ng/mL — AB (ref <0.031)

## 2016-01-16 LAB — INFLUENZA PANEL BY PCR (TYPE A & B)
H1N1 flu by pcr: NOT DETECTED
INFLAPCR: NEGATIVE
Influenza B By PCR: NEGATIVE

## 2016-01-16 MED ORDER — ONDANSETRON HCL 4 MG PO TABS: 4.0000 mg | ORAL_TABLET | Freq: Four times a day (QID) | ORAL | Status: DC | PRN

## 2016-01-16 MED ORDER — DONEPEZIL HCL 5 MG PO TABS
10.0000 mg | ORAL_TABLET | Freq: Every day | ORAL | Status: DC
  Administered 2016-01-16 – 2016-01-18 (×3): 10 mg via ORAL
  Filled 2016-01-16: qty 1
  Filled 2016-01-16: qty 2
  Filled 2016-01-16: qty 1
  Filled 2016-01-16: qty 2
  Filled 2016-01-16: qty 1
  Filled 2016-01-16: qty 2

## 2016-01-16 MED ORDER — MORPHINE SULFATE (PF) 2 MG/ML IV SOLN
2.0000 mg | INTRAVENOUS | Status: DC | PRN
Start: 2016-01-16 — End: 2016-01-16

## 2016-01-16 MED ORDER — ATORVASTATIN CALCIUM 10 MG PO TABS
10.0000 mg | ORAL_TABLET | Freq: Every day | ORAL | Status: DC
  Administered 2016-01-16 – 2016-01-19 (×4): 10 mg via ORAL
  Filled 2016-01-16 (×4): qty 1

## 2016-01-16 MED ORDER — BISACODYL 10 MG RE SUPP: 10.0000 mg | Freq: Every day | RECTAL | Status: DC | PRN

## 2016-01-16 MED ORDER — PIPERACILLIN-TAZOBACTAM 3.375 G IVPB
3.3750 g | Freq: Three times a day (TID) | INTRAVENOUS | Status: DC
  Administered 2016-01-16 – 2016-01-17 (×2): 3.375 g via INTRAVENOUS
  Filled 2016-01-16 (×12): qty 50

## 2016-01-16 MED ORDER — FLUOXETINE HCL 20 MG PO CAPS
40.0000 mg | ORAL_CAPSULE | Freq: Every day | ORAL | Status: DC
  Administered 2016-01-16 – 2016-01-19 (×4): 40 mg via ORAL
  Filled 2016-01-16 (×4): qty 2

## 2016-01-16 MED ORDER — ADULT MULTIVITAMIN W/MINERALS CH: 1.0000 | ORAL_TABLET | Freq: Every day | ORAL | Status: DC

## 2016-01-16 MED ORDER — CALCIUM CARBONATE-VITAMIN D 500-200 MG-UNIT PO TABS: 1.0000 | ORAL_TABLET | Freq: Every day | ORAL | Status: DC

## 2016-01-16 MED ORDER — HEPARIN SODIUM (PORCINE) 5000 UNIT/ML IJ SOLN
5000.0000 [IU] | Freq: Three times a day (TID) | INTRAMUSCULAR | Status: DC
  Administered 2016-01-16 – 2016-01-19 (×10): 5000 [IU] via SUBCUTANEOUS
  Filled 2016-01-16 (×11): qty 1

## 2016-01-16 MED ORDER — ACETAMINOPHEN 325 MG PO TABS: 650.0000 mg | ORAL_TABLET | Freq: Four times a day (QID) | ORAL | Status: DC | PRN

## 2016-01-16 MED ORDER — IPRATROPIUM-ALBUTEROL 0.5-2.5 (3) MG/3ML IN SOLN
3.0000 mL | Freq: Four times a day (QID) | RESPIRATORY_TRACT | Status: DC
  Administered 2016-01-16 – 2016-01-17 (×4): 3 mL via RESPIRATORY_TRACT
  Filled 2016-01-16 (×3): qty 3

## 2016-01-16 MED ORDER — PANTOPRAZOLE SODIUM 40 MG PO TBEC
40.0000 mg | DELAYED_RELEASE_TABLET | Freq: Every day | ORAL | Status: DC
  Administered 2016-01-16 – 2016-01-19 (×4): 40 mg via ORAL
  Filled 2016-01-16 (×4): qty 1

## 2016-01-16 MED ORDER — HYDROCODONE-ACETAMINOPHEN 5-325 MG PO TABS
1.0000 | ORAL_TABLET | ORAL | Status: DC | PRN
  Administered 2016-01-17: 1 via ORAL
  Filled 2016-01-16: qty 1

## 2016-01-16 MED ORDER — ARFORMOTEROL TARTRATE 15 MCG/2ML IN NEBU
15.0000 ug | INHALATION_SOLUTION | Freq: Two times a day (BID) | RESPIRATORY_TRACT | Status: DC
Start: 2016-01-16 — End: 2016-01-16
  Administered 2016-01-16: 15 ug via RESPIRATORY_TRACT
  Filled 2016-01-16 (×7): qty 2

## 2016-01-16 MED ORDER — ALBUTEROL SULFATE (2.5 MG/3ML) 0.083% IN NEBU
2.5000 mg | INHALATION_SOLUTION | Freq: Four times a day (QID) | RESPIRATORY_TRACT | Status: DC
  Administered 2016-01-16 (×2): 2.5 mg via RESPIRATORY_TRACT
  Filled 2016-01-16 (×2): qty 3

## 2016-01-16 MED ORDER — METRONIDAZOLE 500 MG PO TABS
2000.0000 mg | ORAL_TABLET | Freq: Once | ORAL | Status: AC
  Administered 2016-01-16: 2000 mg via ORAL
  Filled 2016-01-16: qty 4

## 2016-01-16 MED ORDER — ALBUTEROL SULFATE (2.5 MG/3ML) 0.083% IN NEBU: 2.5000 mg | INHALATION_SOLUTION | RESPIRATORY_TRACT | Status: DC | PRN

## 2016-01-16 MED ORDER — ONDANSETRON HCL 4 MG/2ML IJ SOLN: 4.0000 mg | Freq: Four times a day (QID) | INTRAMUSCULAR | Status: DC | PRN

## 2016-01-16 MED ORDER — POLYETHYLENE GLYCOL 3350 17 G PO PACK
17.0000 g | PACK | Freq: Every day | ORAL | Status: DC
  Administered 2016-01-16 – 2016-01-19 (×4): 17 g via ORAL
  Filled 2016-01-16 (×4): qty 1

## 2016-01-16 MED ORDER — ASPIRIN EC 325 MG PO TBEC
325.0000 mg | DELAYED_RELEASE_TABLET | Freq: Every day | ORAL | Status: DC
  Administered 2016-01-16 – 2016-01-19 (×4): 325 mg via ORAL
  Filled 2016-01-16 (×4): qty 1

## 2016-01-16 MED ORDER — ACETAMINOPHEN 650 MG RE SUPP: 650.0000 mg | Freq: Four times a day (QID) | RECTAL | Status: DC | PRN

## 2016-01-16 MED ORDER — CEFUROXIME AXETIL 250 MG PO TABS: 250.0000 mg | ORAL_TABLET | Freq: Two times a day (BID) | ORAL | Status: DC

## 2016-01-16 MED ORDER — VERAPAMIL HCL ER 240 MG PO TBCR
240.0000 mg | EXTENDED_RELEASE_TABLET | Freq: Every day | ORAL | Status: DC
  Administered 2016-01-16 – 2016-01-18 (×3): 240 mg via ORAL
  Filled 2016-01-16 (×3): qty 1

## 2016-01-16 MED ORDER — METHYLPREDNISOLONE SODIUM SUCC 40 MG IJ SOLR
40.0000 mg | Freq: Four times a day (QID) | INTRAMUSCULAR | Status: DC
  Administered 2016-01-16 – 2016-01-17 (×5): 40 mg via INTRAVENOUS
  Filled 2016-01-16 (×6): qty 1

## 2016-01-16 NOTE — ED Notes (Signed)
Pt had clean catch urine specimen, no need to catheterize

## 2016-01-16 NOTE — Progress Notes (Addendum)
PROGRESS NOTE  [name] [id number] DOB: [birth year] DOA: 01/15/2016 PCP: Tula Nakayama, MD  Summary: 43yow PMH COPD, OSA noncompliant, s/p ankle fx repair 2/28 and d/c to SNF 3/2 presented to ED 3/3 with hypoxia and confusion. Admitted for COPD exacerbation, acute hypox resp failure.  Assessment/Plan: 1. Acute hypoxic respiratory failure requiring venimask, now on nasal cannula. Flu PCR negative. Lactate normal. CTA chest no PE; bibasilar atelectasis, pneumonia less likely radiographically. However clinically I suspect pneumonia. 2. COPD exacerbation, continue steroids, bronchodilators 3. Suspected aspiration pneumonia based on clinical history. 4. Chronic hypercapneic respiratory failure, appears stable. She is awake, alert and follows commands. 5. Acute encephalopathy, appears resolved. 6. Elevated troponin, minimal elevation, suspect demand ischemia from acute illness, no evidence of ACS. EKG showed sinus tachycardia. Prolonged QT. 7. Pyuria with trichomonas present. 8. s/p right ankle fx repair on 01/13/16 9. Anemia of chronic disease, stable. 10. PMH COPD, CHF, OSA (noncompliant), dysphagia, h/o esophageal stricture s/p dilitation. 11. Dementia  12. Prolonged QT   Clinically improved, now off Venturi mask. Awake and alert. Defer ABG for now.  Add Zosyn, cover aspiration, ST consult  Continue steroids, bronchodilators, oxygen. Offer CPAP at night.  Repeat EKG to assess QT  Flagyl 2 gm x1  Code Status: DNR DVT prophylaxis: heparin Family Communication: none present Disposition Plan: return to SNF  Murray Hodgkins, MD  [facility]  Pager [phone] If 7PM-7AM, please contact night-coverage at [email or web address], password [facility] 01/16/2016, 8:53 AM  LOS: 0 days   Consultants:    Procedures:    Antibiotics:  Zosyn 3/3 >>  HPI/Subjective: Feels better. Breathing okay but still short winded. No nausea or vomiting.  Objective: Filed Vitals:   01/16/16 0200 01/16/16 0259 01/16/16 0300 01/16/16 0545  BP: 126/65 129/66  127/62  Pulse: 95 99  88  Temp:  99.1 F (37.3 C)  99.2 F (37.3 C)  TempSrc:  Oral  Oral  Resp: 13 24  20   Height:   6\' 1"  (1.854 m)   Weight:   112.356 kg (247 lb 11.2 oz)   SpO2: 96% 100% 95% 97%   No intake or output data in the 24 hours ending 01/16/16 0853   Filed Weights   01/15/16 2116 01/16/16 0300  Weight: 113.853 kg (251 lb) 112.356 kg (247 lb 11.2 oz)    Exam:    General:  Appears calm and comfortable. Mildly ill. Alert, participates in history and exam. Cardiovascular: RRR, no m/r/g. No LE edema. Respiratory: CTA bilaterally, no w/r/r. Normal respiratory effort. Speaks in full sentences. Musculoskeletal: grossly normal tone BUE/BLE Psychiatric: grossly normal mood and affect, speech fluent and appropriate Neurologic: grossly non-focal.  New data reviewed:  Troponin minimally elevated, 0.04  Complete metabolic panel unremarkable  Hemoglobin stable at 0.3, no leukocytosis.  Influenza PCR negative  Urinalysis noted  Scheduled Meds: . albuterol  2.5 mg Nebulization Q6H  . arformoterol  15 mcg Nebulization BID  . aspirin EC  325 mg Oral Q breakfast  . atorvastatin  10 mg Oral Daily  . calcium-vitamin D  1 tablet Oral Daily  . cefUROXime  250 mg Oral BID WC  . donepezil  10 mg Oral QHS  . FLUoxetine  40 mg Oral Daily  . heparin  5,000 Units Subcutaneous 3 times per day  . methylPREDNISolone (SOLU-MEDROL) injection  40 mg Intravenous Q6H  . multivitamin with minerals  1 tablet Oral Daily  . pantoprazole  40 mg Oral Daily  . polyethylene glycol  17 g Oral  Daily  . verapamil  240 mg Oral QHS   Continuous Infusions:   Principal Problem:   Aspiration pneumonia (HCC) Active Problems:   Hyperlipidemia LDL goal <100   Obstructive sleep apnea   Essential hypertension   Memory difficulty   COPD exacerbation (HCC)   Acute respiratory failure with hypoxia (HCC)   Hypercapnic  respiratory failure, chronic ([place])   Time spent 20 minutes

## 2016-01-16 NOTE — Care Management Important Message (Signed)
Important Message  Patient Details  Name: [name] MRN: [id number] Date of Birth: [birth year]   Medicare Important Message Given:  Yes    Sherald Barge, RN 01/16/2016, 3:14 PM

## 2016-01-16 NOTE — H&P (Signed)
[facility] History and Physical  [name] [id number] DOB: July 17, 1941    PCP:   Tula Nakayama, MD   Chief Complaint: SOB  HPI: [name] is an 75 y.o. female  with a hx of CPOD, CHF, obstructive sleep apnea, dementia, essential HTN, and HLD and right ankle surgery at [facility] on 2/28, presents to the ED via EMS after initially being found with decreased LOC and hypoxia by [facility] staff. Per EMS, [facility] staff reported sats in the 50's. EMS found pt to have sats in mid 80's with 4L nasal cannula. Per daughter she currently lives at [facility] due to recent surgery but will later return home.  Pt reports that she has been sick past few days with slight productive cough. Pt denies any CP.  She has hx of OSA but doesn't wear her mask.  She has falling tendency, but hasn't hit her head.  Per daughter, pt did have flu shot.  She has hx of esophageal stricture, and had dilatation, but still has dysphagia.  While in ED, workup revealed mild troponin elevation. Mild BNP elevation. Temp is elevated. CT angio revealed no evidence of a pulmonary embolism. There were bibasilar subsegmental atelectatic changes. PNA was less likely. Mildly enlarged bilateral hilar lymph nodes may be reactive. Clinical correlation is recommended. DG chest revealed mild vascular congestion and mild cardiomegaly. Minimal bibasilar atelectasis seen. She was given IV Solumdrol, and nebs, and hospitalist was asked to admit her for COPD exacerbation.   Rewiew of Systems:  Constitutional: Negative for malaise, fever and chills. No significant weight loss or weight gain Eyes: Negative for eye pain, redness and discharge, diplopia, visual changes, or flashes of light. ENMT: Negative for ear pain, hoarseness, nasal congestion, sinus pressure and sore throat. No headaches; tinnitus, drooling, or problem swallowing. Cardiovascular: Negative for chest pain, palpitations, diaphoresis, and peripheral  edema. ; No orthopnea, PND Respiratory: hemoptysis, wheezing and stridor. No pleuritic chestpain. Positive for cough Gastrointestinal: Negative for nausea, vomiting, diarrhea, constipation, abdominal pain, melena, blood in stool, hematemesis, jaundice and rectal bleeding.    Genitourinary: Negative for frequency, dysuria, incontinence,flank pain and hematuria; Musculoskeletal: Negative for back pain and neck pain. Negative for swelling and trauma.;  Skin: . Negative for pruritus, rash, abrasions, bruising and skin lesion.; ulcerations Neuro: Negative for headache, lightheadedness and neck stiffness. Negative for weakness, altered level of consciousness , altered mental status, extremity weakness, burning feet, involuntary movement, seizure and syncope.  Psych: negative for anxiety, depression, insomnia, tearfulness, panic attacks, hallucinations, paranoia, suicidal or homicidal ideation    Past Medical History  Diagnosis Date  . Allergic rhinitis   . Anemia   . Anxiety   . Depression   . GERD (gastroesophageal reflux disease)   . Hypertension   . Low back pain   . Arthritis     abnormal gait   . OSA on CPAP   . Degenerative disc disease, lumbar     L5 nerve impingement   . 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 Arm fracture, left   . Onychomycosis   . Ankle fracture, right   . Hyperglycemia   . Mediastinal lymphadenopathy 1/9    resolving   . COPD (chronic obstructive pulmonary disease) (HCC)     chronic CO2 retention, decreased DLCO   . Cystic acne     adult   . Sleep apnea     stop bang score 6  . History of colon surgery  right hemicolectomy for high-grade adenomas in right colon 2013  . Abnormality of gait 06/06/2014  . Memory difficulty 09/20/2014    mild dementia  . DNR (do not resuscitate)     Past Surgical History  Procedure Laterality Date  . Abdominal hysterectomy  1976    secondary to bleeding   . Oophorectomy  1976  . Epidural steroids     . Orif ankle fracture  01/18/2012    Procedure: OPEN REDUCTION INTERNAL FIXATION (ORIF) ANKLE FRACTURE;  Surgeon: Arther Abbott, MD;  Location: [facility];  Service: Orthopedics;  Laterality: Left;  . Colonoscopy  07/20/2012    Dr. Gala Romney: multiple colonic polyps with large polyps on right side, s/p saline-assisted debulking piecemeal polypectomy and ablation. Not all removed. Path with tubulovillous adenomas, high grade dysplasia.   [name] Appendectomy    . Colon resection  08/18/2012    Procedure: HAND ASSISTED LAPAROSCOPIC COLON RESECTION;  Surgeon: Jamesetta So, MD;  Location: [facility];  Service: General;  Laterality: N/A;  . Colonoscopy N/A 03/09/2013    [id number] polyps-tubular adenomas. S/p right hemicolectomy. next tcs 02/2018  . Esophagogastroduodenoscopy (egd) with esophageal dilation N/A 02/06/2014    Procedure: ESOPHAGOGASTRODUODENOSCOPY (EGD) WITH ESOPHAGEAL DILATION;  Surgeon: Daneil Dolin, MD;  Location: [facility] SU[facility];  Service: Endoscopy;  Laterality: N/A;  9:30  . Orif ankle fracture Right 01/13/2016    Procedure: OPEN REDUCTION INTERNAL FIXATION (ORIF) ANKLE FRACTURE;  Surgeon: Carole Civil, MD;  Location: [facility];  Service: Orthopedics;  Laterality: Right;    Medications:  HOME MEDS: Prior to Admission medications   Medication Sig Start Date End Date Taking? Authorizing Provider  albuterol ([name]) (2.5 MG/3ML) 0.083% nebulizer solution Take 3 mLs (2.5 mg total) by nebulization every 8 (eight) hours as needed for wheezing or shortness of breath. 01/15/16  Yes Carole Civil, MD  Albuterol Sulfate (PROAIR RESPICLICK) 123XX123 (90 BASE) MCG/ACT AEPB Inhale 2 puffs into the lungs every 8 (eight) hours as needed. 11/11/15  Yes [name], MD  arformoterol (BROVANA) 15 MCG/2ML NEBU Take 2 mLs (15 mcg total) by nebulization 2 (two) times daily. 04/25/12  Yes [name], MD  aspirin EC 325 MG EC tablet Take 1 tablet (325 mg total) by mouth daily with breakfast. 01/15/16   Yes Carole Civil, MD  atorvastatin (LIPITOR) 10 MG tablet Take 1 tablet (10 mg total) by mouth daily. 12/15/15  Yes [name], MD  bisacodyl (DULCOLAX) 10 MG suppository Place 1 suppository (10 mg total) rectally daily as needed for moderate constipation. 01/15/16  Yes Carole Civil, MD  calcium-vitamin D (OSCAL WITH D) 500-200 MG-UNIT per tablet Take 1 tablet by mouth daily.    Yes Historical Provider, MD  donepezil (ARICEPT) 10 MG tablet Take 1 tablet (10 mg total) by mouth at bedtime. 12/15/15  Yes [name], MD  FLUoxetine (PROZAC) 40 MG capsule Take 1 capsule (40 mg total) by mouth daily. 12/15/15  Yes [name], MD  HYDROcodone-acetaminophen (NORCO) 5-325 MG tablet Take 1 tablet by mouth every 4 (four) hours as needed for moderate pain. 01/15/16  Yes Carole Civil, MD  loperamide (IMODIUM) 2 MG capsule Take 2 mg by mouth daily.   Yes Historical Provider, MD  loratadine (CLARITIN) 10 MG tablet Take 10 mg by mouth daily.   Yes Historical Provider, MD  Multiple Vitamins-Minerals (CEROVITE SENIOR) TABS Take 1 tablet by mouth daily.    Yes Historical Provider, MD  omeprazole (PRILOSEC) 40  MG capsule Take 1 capsule (40 mg total) by mouth daily. 12/15/15  Yes [name], MD  polyethylene glycol [facility] / GLYCOLAX) packet Take 17 g by mouth daily. 01/15/16  Yes Carole Civil, MD  verapamil (CALAN-SR) 240 MG CR tablet Take 1 tablet (240 mg total) by mouth at bedtime. 12/15/15  Yes [name], MD  Misc. Devices [facility]) MISC Use as needed 12/21/15   Orpah Greek, MD     Allergies:  Allergies  Allergen Reactions  . Fenofibrate Nausea And Vomiting  . Pravastatin Sodium Nausea And Vomiting  . Sulfonamide Derivatives Other (See Comments)    unknown  . Ciprofloxacin Rash    Social History:   reports that she quit smoking about 9 years ago. Her smoking use included Cigarettes. She has a 60 pack-year smoking history. She has never used  smokeless tobacco. She reports that she does not drink alcohol or use illicit drugs.  Family History: Family History  Problem Relation Age of Onset  . Stomach cancer Father   . Cancer Father   . Cancer Mother     brain   . Breast cancer Mother   . Arthritis       Physical Exam: Filed Vitals:   01/15/16 2230 01/15/16 2301 01/15/16 2330 01/15/16 2341  BP: 113/61 109/55 137/62   Pulse: 109 101 115   Temp:      TempSrc:      Resp: 16 14 16    Height:      Weight:      SpO2: 89% 91% 84% 91%   Blood pressure 137/62, pulse 115, temperature 99.8 F (37.7 C), temperature source Oral, resp. rate 16, height 5\' 6"  (1.676 m), weight 113.853 kg (251 lb), SpO2 91 %.  GEN:  Pleasant, patient lying in the stretcher in no acute distress; cooperative with exam. PSYCH:  alert and oriented x4; does not appear anxious or depressed; affect is appropriate. HEENT: Mucous membranes pink and anicteric; PERRLA; EOM intact; no cervical lymphadenopathy nor thyromegaly or carotid bruit; no JVD; There were no stridor. Neck is very supple. Breasts:: Not examined CHEST WALL: No tenderness CHEST: Normal respiration, with mild insp and exp wheezing.  HEART: Regular rate and rhythm.  There are no murmur, rub, or gallops.   BACK: No kyphosis or scoliosis; no CVA tenderness ABDOMEN: soft and non-tender; no masses, no organomegaly, normal abdominal bowel sounds; no pannus; no intertriginous candida. There is no rebound and no distention. Rectal Exam: Not done EXTREMITIES: No bone or joint deformity; age-appropriate arthropathy of the hands and knees; no edema; no ulcerations.  There is no calf tenderness. Genitalia: not examined PULSES: 2+ and symmetric SKIN: Normal hydration no rash or ulceration CNS: Cranial nerves 2-12 grossly intact no focal lateralizing neurologic deficit.  Speech is fluent; uvula elevated with phonation, facial symmetry and tongue midline. DTR are normal bilaterally, cerebella exam is  intact, barbinski is negative and strengths are equaled bilaterally.  No sensory loss.   Labs on Admission:  Basic Metabolic Panel:  Recent Labs Lab 01/12/16 1045 01/14/16 0641 01/15/16 0425 01/15/16 2155  NA 145 145 142 140  K 4.5 4.0 3.8 3.9  CL 104 105 103 104  CO2 31 33* 31 30  GLUCOSE 90 92 76 144*  BUN 21* 17 15 19   CREATININE 0.84 0.87 0.86 0.97  CALCIUM 9.3 8.6* 8.4* 8.7*   CBC:  Recent Labs Lab 01/12/16 1045 01/14/16 0641 01/15/16 0425 01/15/16 2155  WBC 5.4 8.1 6.9 9.7  NEUTROABS 3.6  --   --  8.6*  HGB 10.6* 9.1* 8.8* 9.6*  HCT 35.5* 30.7* 29.7* 32.0*  MCV 88.3 89.8 90.5 89.4  PLT 278 202 217 206   Cardiac Enzymes:  Recent Labs Lab 01/15/16 2155  TROPONINI 0.06*    Radiological Exams on Admission: Ct Angio Chest Pe W/cm &/or Wo Cm  01/15/2016  CLINICAL DATA:  75 year old female with shortness of breath. Recent ankle surgery for fracture. EXAM: CT ANGIOGRAPHY CHEST WITH CONTRAST TECHNIQUE: Multidetector CT imaging of the chest was performed using the standard protocol during bolus administration of intravenous contrast. Multiplanar CT image reconstructions and MIPs were obtained to evaluate the vascular anatomy. CONTRAST:  139mL OMNIPAQUE IOHEXOL 350 MG/ML SOLN COMPARISON:  Radiograph dated 01/15/2016 FINDINGS: There is mild emphysematous changes of the lungs. Bibasilar subpleural subsegmental atelectatic changes noted. There is no pleural effusion or pneumothorax. The central airways are patent. There is atherosclerotic calcification of the thoracic aorta. No CT evidence of pulmonary embolism. Mild cardiomegaly. No pericardial effusion. There is coronary vascular calcification. There are bilateral hilar adenopathy. Small amount of fluid noted within the esophagus. There is no axillary adenopathy. The chest wall soft tissues appear unremarkable. There is degenerative changes of the spine. No acute fracture. The visualized upper abdomen appears unremarkable.  Review of the MIP images confirms the above findings. IMPRESSION: No CT evidence of pulmonary embolism. Bibasilar subsegmental atelectatic changes. Pneumonia is less likely. Mildly enlarged bilateral hilar lymph nodes may be reactive. Clinical correlation is recommended. Electronically Signed   By: Anner Crete M.D.   On: 01/15/2016 23:42   Dg Chest Portable 1 View  01/15/2016  CLINICAL DATA:  Acute onset of decreased level of consciousness. Hypoxia and decreased O2 saturation. Status post recent ankle surgery. Initial encounter. EXAM: PORTABLE CHEST 1 VIEW COMPARISON:  Chest radiograph performed 06/22/2014 FINDINGS: The lungs are well-aerated. Mild vascular congestion is noted. Minimal bibasilar atelectasis is seen. There is no evidence of pleural effusion or pneumothorax. The cardiomediastinal silhouette is mildly enlarged. No acute osseous abnormalities are seen. IMPRESSION: Mild vascular congestion and mild cardiomegaly. Minimal bibasilar atelectasis seen. Electronically Signed   By: Garald Balding M.D.   On: 01/15/2016 21:51    EKG: Independently reviewed.    Assessment/Plan 1. COPD exacerbation, appears stable. Start on nebs, steroids, and abx.  Though less likely, will check for influenza as well.  2. Acute respiratory failure with hypoxia, O2 sats in 80's on admission. Continue nasal cannula 3. Dementia, appears at baseline. Will continue with her Aricept.    Other plans as per orders. Code Status: DNR  Orvan Falconer, MD. [facility] Pager [phone] 7pm to 7am.  01/16/2016, 12:14 AM  By signing my name below, I, Delene Ruffini, attest that this documentation has been prepared under the direction and in the presence of Orvan Falconer, MD. Electronically Signed: Delene Ruffini, Scribe 01/16/2016  12.13am

## 2016-01-16 NOTE — Evaluation (Signed)
Clinical/Bedside Swallow Evaluation Patient Details  Name: DANIA GADEN MRN: [id number] Date of Birth: 01-15-1941  Today's Date: 01/16/2016 Time: SLP Start Time (ACUTE ONLY): 1555 SLP Stop Time (ACUTE ONLY): 1610 SLP Time Calculation (min) (ACUTE ONLY): 15 min  Past Medical History:  Past Medical History  Diagnosis Date  . Allergic rhinitis   . Anemia   . Anxiety   . Depression   . GERD (gastroesophageal reflux disease)   . Hypertension   . Low back pain   . Arthritis     abnormal gait   . OSA on CPAP   . Degenerative disc disease, lumbar     L5 nerve impingement   . 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 Arm fracture, left   . Onychomycosis   . Ankle fracture, right   . Hyperglycemia   . Mediastinal lymphadenopathy 1/9    resolving   . COPD (chronic obstructive pulmonary disease) (HCC)     chronic CO2 retention, decreased DLCO   . Cystic acne     adult   . Sleep apnea     stop bang score 6  . History of colon surgery     right hemicolectomy for high-grade adenomas in right colon 2013  . Abnormality of gait 06/06/2014  . Memory difficulty 09/20/2014    mild dementia  . DNR (do not resuscitate)    Past Surgical History:  Past Surgical History  Procedure Laterality Date  . Abdominal hysterectomy  1976    secondary to bleeding   . Oophorectomy  1976  . Epidural steroids    . Orif ankle fracture  01/18/2012    Procedure: OPEN REDUCTION INTERNAL FIXATION (ORIF) ANKLE FRACTURE;  Surgeon: Arther Abbott, MD;  Location: [facility] ORS;  Service: Orthopedics;  Laterality: Left;  . Colonoscopy  07/20/2012    Dr. Gala Romney: multiple colonic polyps with large polyps on right side, s/p saline-assisted debulking piecemeal polypectomy and ablation. Not all removed. Path with tubulovillous adenomas, high grade dysplasia.   [name] Appendectomy    . Colon resection  08/18/2012    Procedure: HAND ASSISTED LAPAROSCOPIC COLON RESECTION;  Surgeon: Jamesetta So, MD;  Location: [facility]  ORS;  Service: General;  Laterality: N/A;  . Colonoscopy N/A 03/09/2013    [id number] polyps-tubular adenomas. S/p right hemicolectomy. next tcs 02/2018  . Esophagogastroduodenoscopy (egd) with esophageal dilation N/A 02/06/2014    Procedure: ESOPHAGOGASTRODUODENOSCOPY (EGD) WITH ESOPHAGEAL DILATION;  Surgeon: Daneil Dolin, MD;  Location: [facility] END[facility] SUITE;  Service: Endoscopy;  Laterality: N/A;  9:30  . Orif ankle fracture Right 01/13/2016    Procedure: OPEN REDUCTION INTERNAL FIXATION (ORIF) ANKLE FRACTURE;  Surgeon: Carole Civil, MD;  Location: [facility];  Service: Orthopedics;  Laterality: Right;   HPI:  SABA SOBERON is an 75 y.o. female with a hx of CPOD, CHF, obstructive sleep apnea, dementia, essential HTN, and HLD and right ankle surgery at [facility] on 2/28, presents to the ED via EMS after initially being found with decreased LOC and hypoxia by [facility] staff. Per EMS, [facility] staff reported sats in the 50's. EMS found pt to have sats in mid 80's with 4L nasal cannula. Per daughter she currently lives at [facility] due to recent surgery but will later return home. Pt reports that she has been sick past few days with slight productive cough. Chest X-Ray reveals "Mild vascular congestion and mild cardiomegaly. Minimal bibasilar atelectasis seen."   Assessment / Plan / Recommendation  Clinical Impression  Pt with known history of esophageal dysphagia and esophageal dialation from 02/06/2014. Patient consumed all trials and textures during evaluation with no oropharyngeal overt s/sx of aspiration. She did consume thin liquids with occasional audible swallows and consistent belching after consecutive sips of liquid via straw. Recommend D3/Mechanical Soft and Thin liquids; Recommend patient utilize swallowing/esophageal strategies including alternating bites and sips, small sips/bites and slow intake rate. Recommendations reviewed with nursing and patient. There are no further ST needs  noted at this time.     Aspiration Risk  Mild aspiration risk    Diet Recommendation Dysphagia 3 (Mech soft);Thin liquid   Liquid Administration via: Cup;Straw Medication Administration: Whole meds with liquid Supervision: Patient able to self feed;Intermittent supervision to cue for compensatory strategies Compensations: Slow rate;Small sips/bites;Follow solids with liquid;Clear throat intermittently Postural Changes: Seated upright at 90 degrees    Other  Recommendations Oral Care Recommendations: Oral care BID   Follow up Recommendations  None    Frequency and Duration            Prognosis Prognosis for Safe Diet Advancement: [place] Date of Onset: 01/15/16 HPI: GENTRY MLAKAR is an 75 y.o. female with a hx of CPOD, CHF, obstructive sleep apnea, dementia, essential HTN, and HLD and right ankle surgery at [facility] on 2/28, presents to the ED via EMS after initially being found with decreased LOC and hypoxia by [facility] staff. Per EMS, [facility] staff reported sats in the 50's. EMS found pt to have sats in mid 80's with 4L nasal cannula. Per daughter she currently lives at [facility] due to recent surgery but will later return home. Pt reports that she has been sick past few days with slight productive cough. Chest X-Ray reveals "Mild vascular congestion and mild cardiomegaly. Minimal bibasilar atelectasis seen." Type of Study: Bedside Swallow Evaluation Diet Prior to this Study: Dysphagia 1 (puree);Thin liquids Temperature Spikes Noted: No Respiratory Status: Nasal cannula History of Recent Intubation: No Behavior/Cognition: Alert;Cooperative;Pleasant mood Oral Cavity Assessment: Within Functional Limits Oral Care Completed by SLP: No Oral Cavity - Dentition: Dentures, top;Dentures, bottom Vision: Functional for self-feeding Self-Feeding Abilities: Able to feed self Patient Positioning: Upright in bed Baseline Vocal Quality:  Normal Volitional Cough: Strong Volitional Swallow: Able to elicit    Oral/Motor/Sensory Function Overall Oral Motor/Sensory Function: Within functional limits   Ice Chips Ice chips: Within functional limits   Thin Liquid Thin Liquid: Impaired Presentation: Straw Other Comments: Esophageal concerns    Nectar Thick Nectar Thick Liquid: Not tested   Honey Thick Honey Thick Liquid: Not tested   Puree Puree: Not tested   Solid  Daylee Delahoz H. Roddie Mc, CCC-SLP Speech Language Pathologist    Solid: Within functional limits        Wende Bushy 01/16/2016,4:29 PM

## 2016-01-16 NOTE — Progress Notes (Signed)
Pharmacy Antibiotic Note  [name] is a 74 y.o. female admitted on 01/15/2016 with pneumonia.  Pharmacy has been consulted for zosyn dosing.  Plan: Zosyn 3.375 gm IV q8 hours, each dose infused over 4 hours F/u renal function, cultures and clinical course  Height: 6\' 1"  (185.4 cm) Weight: 247 lb 11.2 oz (112.356 kg) IBW/kg (Calculated) : 75.4  Temp (24hrs), Avg:99.1 F (37.3 C), Min:98.3 F (36.8 C), Max:99.8 F (37.7 C)   Recent Labs Lab 01/12/16 1045 01/14/16 0641 01/15/16 0425 01/15/16 2155 01/16/16 0426  WBC 5.4 8.1 6.9 9.7 9.2  CREATININE 0.84 0.87 0.86 0.97 0.88  LATICACIDVEN  --   --   --  0.9  --     Estimated Creatinine Clearance: 79.9 mL/min (by C-G formula based on Cr of 0.88).    Allergies  Allergen Reactions  . Fenofibrate Nausea And Vomiting  . Pravastatin Sodium Nausea And Vomiting  . Sulfonamide Derivatives Other (See Comments)    unknown  . Ciprofloxacin Rash    Antimicrobials this admission: Zosyn 3/3 >>   Thank you for allowing pharmacy to be a part of this patient's care.  Excell Seltzer Poteet 01/16/2016 10:49 AM

## 2016-01-17 MED ORDER — IPRATROPIUM-ALBUTEROL 0.5-2.5 (3) MG/3ML IN SOLN
3.0000 mL | Freq: Three times a day (TID) | RESPIRATORY_TRACT | Status: DC
  Administered 2016-01-17 – 2016-01-19 (×6): 3 mL via RESPIRATORY_TRACT
  Filled 2016-01-17 (×5): qty 3

## 2016-01-17 MED ORDER — METHYLPREDNISOLONE SODIUM SUCC 40 MG IJ SOLR
40.0000 mg | Freq: Two times a day (BID) | INTRAMUSCULAR | Status: DC
  Administered 2016-01-17 – 2016-01-18 (×2): 40 mg via INTRAVENOUS
  Filled 2016-01-17 (×2): qty 1

## 2016-01-17 MED ORDER — AMOXICILLIN-POT CLAVULANATE 875-125 MG PO TABS
1.0000 | ORAL_TABLET | Freq: Two times a day (BID) | ORAL | Status: DC
  Administered 2016-01-17 – 2016-01-19 (×5): 1 via ORAL
  Filled 2016-01-17 (×5): qty 1

## 2016-01-17 NOTE — Progress Notes (Signed)
PROGRESS NOTE  GOLDEN HAVARD [id number] DOB: [birth year] DOA: 01/15/2016 PCP: Tula Nakayama, MD  Summary: 12yow PMH COPD, OSA noncompliant, s/p ankle fx repair 2/28 and d/c to SNF 3/2 presented to ED 3/3 with hypoxia and confusion. Admitted for COPD exacerbation, acute on chronic hypox resp failure.  Assessment/Plan: 1. Acute hypoxic respiratory failure requiring venimask, now on nasal cannula. Flu PCR negative. Lactate normal. CTA chest no PE; bibasilar atelectasis, pneumonia less likely radiographically. However clinically I suspect pneumonia. She is still on high flow nasal cannula. Continue to wean down oxygen as tolerated 2. COPD exacerbation, continue steroids, bronchodilators 3. Suspected aspiration pneumonia based on clinical history. Currently on zosyn, but will change to augmentin she is clinically improving. 4. Chronic hypercapneic respiratory failure, appears stable. She is awake, alert and follows commands. 5. Acute encephalopathy, appears resolved. 6. Elevated troponin, minimal elevation, suspect demand ischemia from acute illness, no evidence of ACS. No complaints of chest pain. EKG showed sinus tachycardia. Prolonged QT. 7. Pyuria with trichomonas present. Treated with Flagyl 2 grams x1 8. s/p right ankle fx repair on 01/13/16 9. Anemia of chronic disease, stable. 10. PMH COPD, CHF, OSA (noncompliant), dysphagia, h/o esophageal stricture s/p dilitation. 11. Dementia  12. Prolonged QT. Will repeat EKG today   Code Status: DNR DVT prophylaxis: heparin Family Communication: none present Disposition Plan: anticipate discharge back to SNF tomorrow if we can continue to wean oxygen.  Kathie Dike, MD  [facility]  Pager [phone] If 7PM-7AM, please contact night-coverage at [email or web address], password [email or web address] 01/17/2016, 9:57 AM  LOS: 1 day   Consultants:    Procedures:    Antibiotics:  Zosyn 3/3 >>  HPI/Subjective: Shortness of breath is improving.  Denies cough  Objective: Filed Vitals:   01/16/16 2200 01/17/16 0527 01/17/16 0728 01/17/16 0735  BP:  122/66 138/77   Pulse: 86 72 73   Temp:  97.6 F (36.4 C) 97.5 F (36.4 C)   TempSrc:  Axillary Axillary   Resp: 16 16 16    Height:      Weight:  111.2 kg (245 lb 2.4 oz)    SpO2: 93% 99% 100% 100%    Intake/Output Summary (Last 24 hours) at 01/17/16 0957 Last data filed at 01/17/16 0910  Gross per 24 hour  Intake    480 ml  Output    350 ml  Net    130 ml     Filed Weights   01/15/16 2116 01/16/16 0300 01/17/16 0527  Weight: 113.853 kg (251 lb) 112.356 kg (247 lb 11.2 oz) 111.2 kg (245 lb 2.4 oz)    Exam:    General:  Appears calm and comfortable. No distress Cardiovascular: RRR, no m/r/g. No LE edema. Respiratory: coarse breath sounds at bases. Normal respiratory effort. Speaks in full sentences. Musculoskeletal: grossly normal tone BUE/BLE Psychiatric: grossly normal mood and affect, speech fluent and appropriate Neurologic: grossly non-focal.   Scheduled Meds: . aspirin EC  325 mg Oral Q breakfast  . atorvastatin  10 mg Oral Daily  . donepezil  10 mg Oral QHS  . FLUoxetine  40 mg Oral Daily  . heparin  5,000 Units Subcutaneous 3 times per day  . ipratropium-albuterol  3 mL Nebulization QID  . methylPREDNISolone (SOLU-MEDROL) injection  40 mg Intravenous Q6H  . pantoprazole  40 mg Oral Daily  . piperacillin-tazobactam (ZOSYN)  IV  3.375 g Intravenous Q8H  . polyethylene glycol  17 g Oral Daily  . verapamil  240 mg Oral QHS   Continuous  Infusions:   Principal Problem:   Aspiration pneumonia (HCC) Active Problems:   Hyperlipidemia LDL goal <100   Obstructive sleep apnea   Essential hypertension   Memory difficulty   COPD exacerbation (HCC)   Acute respiratory failure with hypoxia (HCC)   Hypercapnic respiratory failure, chronic ([place])   Time spent 20 minutes

## 2016-01-18 DIAGNOSIS — J441 Chronic obstructive pulmonary disease with (acute) exacerbation: Principal | ICD-10-CM

## 2016-01-18 DIAGNOSIS — J42 Unspecified chronic bronchitis: Secondary | ICD-10-CM

## 2016-01-18 DIAGNOSIS — E785 Hyperlipidemia, unspecified: Secondary | ICD-10-CM

## 2016-01-18 DIAGNOSIS — J9612 Chronic respiratory failure with hypercapnia: Secondary | ICD-10-CM

## 2016-01-18 DIAGNOSIS — J9601 Acute respiratory failure with hypoxia: Secondary | ICD-10-CM

## 2016-01-18 DIAGNOSIS — J690 Pneumonitis due to inhalation of food and vomit: Secondary | ICD-10-CM

## 2016-01-18 DIAGNOSIS — I10 Essential (primary) hypertension: Secondary | ICD-10-CM

## 2016-01-18 MED ORDER — AMOXICILLIN-POT CLAVULANATE 875-125 MG PO TABS: 1.0000 | ORAL_TABLET | Freq: Two times a day (BID) | ORAL | Status: DC

## 2016-01-18 MED ORDER — HYDROCODONE-ACETAMINOPHEN 5-325 MG PO TABS: 1.0000 | ORAL_TABLET | ORAL | Status: DC | PRN

## 2016-01-18 MED ORDER — ALBUTEROL SULFATE (2.5 MG/3ML) 0.083% IN NEBU: 2.5000 mg | INHALATION_SOLUTION | RESPIRATORY_TRACT | Status: DC | PRN

## 2016-01-18 MED ORDER — PREDNISONE 20 MG PO TABS
60.0000 mg | ORAL_TABLET | Freq: Once | ORAL | Status: DC
  Filled 2016-01-18: qty 3

## 2016-01-18 MED ORDER — PREDNISONE 10 MG PO TABS: ORAL_TABLET | ORAL | Status: DC

## 2016-01-18 NOTE — Progress Notes (Signed)
Patient has worn CPAP at night for sleeping. She has worn a medium face mask with a CPAP of 8 and 2L oxygen bleed in.

## 2016-01-18 NOTE — Discharge Summary (Signed)
Physician Discharge Summary   Patient ID: [name] MRN: [id number] DOB/AGE: 07/21/1941 75 y.o.  Admit date: 01/15/2016 Discharge date: 01/18/2016  Primary Care Physician:  Tula Nakayama, MD  Discharge Diagnoses:       Acute on chronic hypoxic respiratory failure  .  COPD (chronic obstructive pulmonary disease) (HCC) exacerbation  . suspected aspiration pneumonia    Acute encephalopathy : Resolved    My detailed dictated troponin due to demand ischemia  . Essential hypertension . Hyperlipidemia LDL goal <100 . Memory difficulty . Obstructive sleep apnea . status post right ankle fracture repair on 2/28   Pyuria with Trichomonas- treated  Consults:  None  Recommendations for Outpatient Follow-up:  1. PTOT for rehabilitation 2. Please repeat CBC/BMET at next visit   DIET: Dysphagia 3 diet with thin liquids    Allergies:   Allergies  Allergen Reactions  . Fenofibrate Nausea And Vomiting  . Pravastatin Sodium Nausea And Vomiting  . Sulfonamide Derivatives Other (See Comments)    unknown  . Ciprofloxacin Rash     DISCHARGE MEDICATIONS: Current Discharge Medication List    START taking these medications   Details  amoxicillin-clavulanate (AUGMENTIN) 875-125 MG tablet Take 1 tablet by mouth 2 (two) times daily. X 7days Qty: 14 tablet, Refills: 0    predniSONE (DELTASONE) 10 MG tablet Prednisone dosing: Take  Prednisone 40mg  (4 tabs) x 3 days, then taper to 30mg  (3 tabs) x 3 days, then 20mg  (2 tabs) x 3days, then 10mg  (1 tab) x 3days, then OFF.  Dispense:  30 tabs, refills: None Qty: 30 tablet, Refills: 0      CONTINUE these medications which have CHANGED   Details  albuterol ([name]) (2.5 MG/3ML) 0.083% nebulizer solution Take 3 mLs (2.5 mg total) by nebulization every 4 (four) hours as needed for wheezing or shortness of breath. Qty: 75 mL, Refills: 12    HYDROcodone-acetaminophen (NORCO) 5-325 MG tablet Take 1 tablet by mouth every 4 (four)  hours as needed for moderate pain. Qty: 30 tablet, Refills: 0      CONTINUE these medications which have NOT CHANGED   Details  Albuterol Sulfate (PROAIR RESPICLICK) 123XX123 (90 BASE) MCG/ACT AEPB Inhale 2 puffs into the lungs every 8 (eight) hours as needed. Qty: 1 each, Refills: 11    arformoterol (BROVANA) 15 MCG/2ML NEBU Take 2 mLs (15 mcg total) by nebulization 2 (two) times daily. Qty: 360 mL, Refills: 1   Associated Diagnoses: COPD (chronic obstructive pulmonary disease) (HCC)    aspirin EC 325 MG EC tablet Take 1 tablet (325 mg total) by mouth daily with breakfast. Qty: 30 tablet, Refills: 0    atorvastatin (LIPITOR) 10 MG tablet Take 1 tablet (10 mg total) by mouth daily. Qty: 90 tablet, Refills: 1    bisacodyl (DULCOLAX) 10 MG suppository Place 1 suppository (10 mg total) rectally daily as needed for moderate constipation. Qty: 12 suppository, Refills: 0    calcium-vitamin D (OSCAL WITH D) 500-200 MG-UNIT per tablet Take 1 tablet by mouth daily.     donepezil (ARICEPT) 10 MG tablet Take 1 tablet (10 mg total) by mouth at bedtime. Qty: 90 tablet, Refills: 1   Associated Diagnoses: Memory difficulty    FLUoxetine (PROZAC) 40 MG capsule Take 1 capsule (40 mg total) by mouth daily. Qty: 90 capsule, Refills: 1    loperamide (IMODIUM) 2 MG capsule Take 2 mg by mouth daily.    loratadine (CLARITIN) 10 MG tablet Take 10 mg by mouth daily.  Multiple Vitamins-Minerals (CEROVITE SENIOR) TABS Take 1 tablet by mouth daily.     omeprazole (PRILOSEC) 40 MG capsule Take 1 capsule (40 mg total) by mouth daily. Qty: 90 capsule, Refills: 1    polyethylene glycol (MIRALAX / GLYCOLAX) packet Take 17 g by mouth daily. Qty: 14 each, Refills: 0    verapamil (CALAN-SR) 240 MG CR tablet Take 1 tablet (240 mg total) by mouth at bedtime. Qty: 90 tablet, Refills: 1    Misc. Devices [facility]) MISC Use as needed Qty: 1 each, Refills: 0         Brief H and P: For complete details  please refer to admission H and P, but in brief75yow PMH COPD, OSA noncompliant, s/p ankle fx repair 2/28 and d/c to SNF 3/2 presented to ED 3/3 with hypoxia and confusion. Admitted for COPD exacerbation, acute on chronic hypox resp failure.  Hospital Course:  1. Acute hypoxic respiratory failure requiring venimask, now on nasal cannula. - Flu PCR negative. Lactate normal. - CTA chest no PE; bibasilar atelectasis, pneumonia less likely radiographically.  - patient was placed on Zosyn, transitioned to oral Augmentin on 3/4.  - wean O2 as tolerated, O2 sats 94% on 3 L. Per patient chronically on 2 L  2. COPD exacerbation: Resolved, continue Augmentin, albuterol bronchodilators, brovana, steroid taper 3. Suspected aspiration pneumonia based on clinical history:  - patient was placed on IV Zosyn, transitioned to oral Augmentin on 3/4.  4. Chronic hypercapneic respiratory failure, appears stable. She is awake, alert and follows commands. Wean O2 as tolerated. 5. Acute encephalopathy, appears resolved, currently at baseline. 6. Elevated troponin, minimal elevation, suspect demand ischemia from acute illness, no evidence of ACS. No complaints of chest pain. EKG showed sinus tachycardia. Prolonged QT. 7. Pyuria with trichomonas present. Treated with Flagyl 2 grams x1 8. s/p right ankle fx repair on 01/13/16. Continue PTOT 9. Anemia of chronic disease, stable. 10. PMH COPD, CHF, OSA (noncompliant), dysphagia, h/o esophageal stricture s/p dilitation. 11. Dementia  12. Prolonged QT. QTC 491, follow closely  Day of Discharge BP 150/95 mmHg  Pulse 80  Temp(Src) 98.7 F (37.1 C) (Oral)  Resp 18  Ht 6\' 1"  (1.854 m)  Wt 110.995 kg (244 lb 11.2 oz)  BMI 32.29 kg/m2  SpO2 95%  Physical Exam: General: Alert and awake oriented x3 not in any acute distress. HEENT: anicteric sclera, pupils reactive to light and accommodation CVS: S1-S2 clear no murmur rubs or gallops Chest: clear to auscultation  bilaterally, no wheezing rales or rhonchi Abdomen: soft nontender, nondistended, normal bowel sounds Extremities: no cyanosis, clubbing or edema noted bilaterally, right lower extremity dressing intact Neuro: Cranial nerves II-XII intact, no focal neurological deficits   The results of significant diagnostics from this hospitalization (including imaging, microbiology, ancillary and laboratory) are listed below for reference.    LAB RESULTS: Basic Metabolic Panel:  Recent Labs Lab 01/15/16 2155 01/16/16 0426  NA 140 143  K 3.9 4.0  CL 104 103  CO2 30 31  GLUCOSE 144* 153*  BUN 19 19  CREATININE 0.97 0.88  CALCIUM 8.7* 8.8*   Liver Function Tests:  Recent Labs Lab 01/16/16 0426  AST 40  ALT 17  ALKPHOS 92  BILITOT 0.4  PROT 6.7  ALBUMIN 3.3*   No results for input(s): LIPASE, AMYLASE in the last 168 hours. No results for input(s): AMMONIA in the last 168 hours. CBC:  Recent Labs Lab 01/15/16 2155 01/16/16 0426  WBC 9.7 9.2  NEUTROABS 8.6*  --  HGB 9.6* 9.3*  HCT 32.0* 30.8*  MCV 89.4 89.3  PLT 206 219   Cardiac Enzymes:  Recent Labs Lab 01/15/16 2155 01/16/16 0927  TROPONINI 0.06* 0.04*   BNP: Invalid input(s): POCBNP CBG: No results for input(s): GLUCAP in the last 168 hours.  Significant Diagnostic Studies:  Ct Angio Chest Pe W/cm &/or Wo Cm  01/15/2016  CLINICAL DATA:  75 year old female with shortness of breath. Recent ankle surgery for fracture. EXAM: CT ANGIOGRAPHY CHEST WITH CONTRAST TECHNIQUE: Multidetector CT imaging of the chest was performed using the standard protocol during bolus administration of intravenous contrast. Multiplanar CT image reconstructions and MIPs were obtained to evaluate the vascular anatomy. CONTRAST:  133mL OMNIPAQUE IOHEXOL 350 MG/ML SOLN COMPARISON:  Radiograph dated 01/15/2016 FINDINGS: There is mild emphysematous changes of the lungs. Bibasilar subpleural subsegmental atelectatic changes noted. There is no pleural  effusion or pneumothorax. The central airways are patent. There is atherosclerotic calcification of the thoracic aorta. No CT evidence of pulmonary embolism. Mild cardiomegaly. No pericardial effusion. There is coronary vascular calcification. There are bilateral hilar adenopathy. Small amount of fluid noted within the esophagus. There is no axillary adenopathy. The chest wall soft tissues appear unremarkable. There is degenerative changes of the spine. No acute fracture. The visualized upper abdomen appears unremarkable. Review of the MIP images confirms the above findings. IMPRESSION: No CT evidence of pulmonary embolism. Bibasilar subsegmental atelectatic changes. Pneumonia is less likely. Mildly enlarged bilateral hilar lymph nodes may be reactive. Clinical correlation is recommended. Electronically Signed   By: Anner Crete M.D.   On: 01/15/2016 23:42   Dg Chest Portable 1 View  01/15/2016  CLINICAL DATA:  Acute onset of decreased level of consciousness. Hypoxia and decreased O2 saturation. Status post recent ankle surgery. Initial encounter. EXAM: PORTABLE CHEST 1 VIEW COMPARISON:  Chest radiograph performed 06/22/2014 FINDINGS: The lungs are well-aerated. Mild vascular congestion is noted. Minimal bibasilar atelectasis is seen. There is no evidence of pleural effusion or pneumothorax. The cardiomediastinal silhouette is mildly enlarged. No acute osseous abnormalities are seen. IMPRESSION: Mild vascular congestion and mild cardiomegaly. Minimal bibasilar atelectasis seen. Electronically Signed   By: Garald Balding M.D.   On: 01/15/2016 21:51    2D ECHO:   Disposition and Follow-up: Discharge Instructions    Discharge instructions    Complete by:  As directed   DYSPHAGIA 3, mechanical soft diet, thin liquids     Increase activity slowly    Complete by:  As directed             DISPOSITION: Skilled nursing facility   DISCHARGE FOLLOW-UP Follow-up Information    Follow up with  Tula Nakayama, MD. Schedule an appointment as soon as possible for a visit in 10 days.   Specialty:  Family Medicine   Why:  for hospital follow-up   Contact information:   [address], [place] [address] [phone]        Time spent on Discharge: 35 minutes  Signed:   Mattea Seger M.D. [facility] 01/18/2016, 9:39 AM Pager: [phone]

## 2016-01-18 NOTE — NC FL2 (Signed)
[place] LEVEL OF CARE SCREENING TOOL     IDENTIFICATION  Patient Name: [name] Birthdate: 04-21-1941 Sex: female Admission Date (Current Location): 01/15/2016  [facility] and [place] Number:  [employer] and Address:  [place] [address] [place]      Provider Number: [id number]  Attending Physician Name and Address:  Mendel Corning, MD  Relative Name and Phone Number:       Current Level of Care: Hospital Recommended Level of Care: [place] Prior Approval Number:    Date Approved/Denied:   PASRR Number:    Discharge Plan: SNF    Current Diagnoses: Patient Active Problem List   Diagnosis Date Noted  . COPD exacerbation ([place]) 01/16/2016  . Aspiration pneumonia ([place]) 01/16/2016  . Acute respiratory failure with hypoxia ([place]) 01/16/2016  . Hypercapnic respiratory failure, chronic ([place]) 01/16/2016  . Bimalleolar ankle fracture 01/13/2016  . Medicare annual wellness visit, subsequent 08/13/2015  . Memory difficulty 09/20/2014  . Abnormality of gait 06/06/2014  . Cervical neck pain with evidence of disc disease 05/20/2014  . Multiple adenomatous polyps 02/15/2013  . Spinal stenosis 09/29/2011  . Falls frequently 09/01/2011  . ALLERGIC RHINITIS, SEASONAL 04/10/2010  . Hyperlipidemia LDL goal <100 01/25/2007  . ANEMIA-NOS 10/05/2006  . DEPRESSION 10/05/2006  . Obstructive sleep apnea 10/05/2006  . Essential hypertension 10/05/2006  . GERD 10/05/2006  . Generalized osteoarthritis of multiple sites 10/05/2006    Orientation RESPIRATION BLADDER Height & Weight     Self, Time, Situation, Place  O2 Continent Weight: 244 lb 11.2 oz (110.995 kg) Height:  6\' 1"  (185.4 cm)  BEHAVIORAL SYMPTOMS/MOOD NEUROLOGICAL BOWEL NUTRITION STATUS      Continent Diet  AMBULATORY STATUS COMMUNICATION OF NEEDS Skin   Extensive Assist Verbally Surgical wounds                       Personal Care  Assistance Level of Assistance  Bathing, Dressing Bathing Assistance: Maximum assistance   Dressing Assistance: Maximum assistance     Functional Limitations Info             SPECIAL CARE FACTORS FREQUENCY  PT (By licensed PT)                    Contractures      Additional Factors Info  Allergies               Current Medications (01/18/2016):  This is the current hospital active medication list Current Facility-Administered Medications  Medication Dose Route Frequency Provider Last Rate Last Dose  . acetaminophen (TYLENOL) tablet 650 mg  650 mg Oral Q6H PRN Orvan Falconer, MD       Or  . acetaminophen (TYLENOL) suppository 650 mg  650 mg Rectal Q6H PRN Orvan Falconer, MD      . albuterol ([name]) (2.5 MG/3ML) 0.083% nebulizer solution 2.5 mg  2.5 mg Nebulization Q2H PRN Orvan Falconer, MD      . amoxicillin-clavulanate (AUGMENTIN) 875-125 MG per tablet 1 tablet  1 tablet Oral BID Kathie Dike, MD   1 tablet at 01/17/16 2114  . aspirin EC tablet 325 mg  325 mg Oral Q breakfast Orvan Falconer, MD   325 mg at 01/18/16 [id number]  . atorvastatin (LIPITOR) tablet 10 mg  10 mg Oral Daily Orvan Falconer, MD   10 mg at 01/17/16 1056  . bisacodyl (DULCOLAX) suppository 10 mg  10 mg Rectal  Daily PRN Orvan Falconer, MD      . donepezil (ARICEPT) tablet 10 mg  10 mg Oral QHS Orvan Falconer, MD   10 mg at 01/17/16 2114  . FLUoxetine (PROZAC) capsule 40 mg  40 mg Oral Daily Orvan Falconer, MD   40 mg at 01/17/16 1056  . heparin injection 5,000 Units  5,000 Units Subcutaneous 3 times per day Orvan Falconer, MD   5,000 Units at 01/18/16 0536  . HYDROcodone-acetaminophen (NORCO/VICODIN) 5-325 MG per tablet 1 tablet  1 tablet Oral Q4H PRN Orvan Falconer, MD   1 tablet at 01/17/16 2115  . ipratropium-albuterol (DUONEB) 0.5-2.5 (3) MG/3ML nebulizer solution 3 mL  3 mL Nebulization Q8H Kathie Dike, MD   3 mL at 01/17/16 1942  . ondansetron (ZOFRAN) tablet 4 mg  4 mg Oral Q6H PRN Orvan Falconer, MD       Or  . ondansetron [facility]) injection 4 mg  4  mg Intravenous Q6H PRN Orvan Falconer, MD      . pantoprazole (PROTONIX) EC tablet 40 mg  40 mg Oral Daily Orvan Falconer, MD   40 mg at 01/17/16 1056  . polyethylene glycol (MIRALAX / GLYCOLAX) packet 17 g  17 g Oral Daily Orvan Falconer, MD   17 g at 01/17/16 1055  . predniSONE (DELTASONE) tablet 60 mg  60 mg Oral Once Ripudeep Krystal Eaton, MD   60 mg at 01/18/16 0845  . verapamil (CALAN-SR) CR tablet 240 mg  240 mg Oral QHS Orvan Falconer, MD   240 mg at 01/17/16 2114     Discharge Medications: Please see discharge summary for a list of discharge medications.  Relevant Imaging Results:  Relevant Lab Results:   Additional Information    Ludwig Clarks, LCSW

## 2016-01-18 NOTE — Progress Notes (Signed)
Spoke with Marcie Bal, On call for Social Work this weekend, facility unable to accept pt this evening.  Will continue to monitor pt until discharge to [facility] tomorrow.  Notified Dr Tana Coast via text page.

## 2016-01-18 NOTE — Progress Notes (Signed)
CSW contacted [facility] to facilitate dc back- they are requesting CPAP orders and settings. CSW has contacted MD who aske that RN have RT to document this in the chart for SNF- CSW spoke with RN who will assist with this-  DC pending for possible today/tomorrow-   Eduard Clos, MSW, LCSW  [phone]

## 2016-01-19 DIAGNOSIS — S82843D Displaced bimalleolar fracture of unspecified lower leg, subsequent encounter for closed fracture with routine healing: Secondary | ICD-10-CM | POA: Diagnosis not present

## 2016-01-19 DIAGNOSIS — J9611 Chronic respiratory failure with hypoxia: Secondary | ICD-10-CM | POA: Diagnosis present

## 2016-01-19 DIAGNOSIS — J439 Emphysema, unspecified: Secondary | ICD-10-CM

## 2016-01-19 DIAGNOSIS — Z9889 Other specified postprocedural states: Secondary | ICD-10-CM | POA: Diagnosis not present

## 2016-01-19 DIAGNOSIS — G4733 Obstructive sleep apnea (adult) (pediatric): Secondary | ICD-10-CM | POA: Diagnosis not present

## 2016-01-19 DIAGNOSIS — X58XXXD Exposure to other specified factors, subsequent encounter: Secondary | ICD-10-CM | POA: Diagnosis not present

## 2016-01-19 DIAGNOSIS — M84471D Pathological fracture, right ankle, subsequent encounter for fracture with routine healing: Secondary | ICD-10-CM | POA: Diagnosis not present

## 2016-01-19 DIAGNOSIS — D638 Anemia in other chronic diseases classified elsewhere: Secondary | ICD-10-CM | POA: Diagnosis not present

## 2016-01-19 DIAGNOSIS — I10 Essential (primary) hypertension: Secondary | ICD-10-CM | POA: Diagnosis not present

## 2016-01-19 DIAGNOSIS — J440 Chronic obstructive pulmonary disease with acute lower respiratory infection: Secondary | ICD-10-CM | POA: Diagnosis not present

## 2016-01-19 DIAGNOSIS — T148 Other injury of unspecified body region: Secondary | ICD-10-CM | POA: Diagnosis present

## 2016-01-19 DIAGNOSIS — S82841D Displaced bimalleolar fracture of right lower leg, subsequent encounter for closed fracture with routine healing: Secondary | ICD-10-CM | POA: Diagnosis not present

## 2016-01-19 DIAGNOSIS — M1990 Unspecified osteoarthritis, unspecified site: Secondary | ICD-10-CM | POA: Diagnosis not present

## 2016-01-19 DIAGNOSIS — S82891D Other fracture of right lower leg, subsequent encounter for closed fracture with routine healing: Secondary | ICD-10-CM | POA: Diagnosis not present

## 2016-01-19 DIAGNOSIS — J9612 Chronic respiratory failure with hypercapnia: Secondary | ICD-10-CM | POA: Diagnosis not present

## 2016-01-19 DIAGNOSIS — D649 Anemia, unspecified: Secondary | ICD-10-CM | POA: Diagnosis not present

## 2016-01-19 DIAGNOSIS — K219 Gastro-esophageal reflux disease without esophagitis: Secondary | ICD-10-CM | POA: Diagnosis not present

## 2016-01-19 DIAGNOSIS — G9340 Encephalopathy, unspecified: Secondary | ICD-10-CM | POA: Diagnosis not present

## 2016-01-19 DIAGNOSIS — J690 Pneumonitis due to inhalation of food and vomit: Secondary | ICD-10-CM | POA: Diagnosis not present

## 2016-01-19 DIAGNOSIS — J9601 Acute respiratory failure with hypoxia: Secondary | ICD-10-CM | POA: Diagnosis not present

## 2016-01-19 DIAGNOSIS — E785 Hyperlipidemia, unspecified: Secondary | ICD-10-CM | POA: Diagnosis not present

## 2016-01-19 DIAGNOSIS — J449 Chronic obstructive pulmonary disease, unspecified: Secondary | ICD-10-CM | POA: Diagnosis not present

## 2016-01-19 DIAGNOSIS — F411 Generalized anxiety disorder: Secondary | ICD-10-CM | POA: Diagnosis not present

## 2016-01-19 DIAGNOSIS — S82891A Other fracture of right lower leg, initial encounter for closed fracture: Secondary | ICD-10-CM | POA: Diagnosis not present

## 2016-01-19 NOTE — Care Management Important Message (Signed)
Important Message  Patient Details  Name: [name] MRN: [id number] Date of Birth: [birth year]   Medicare Important Message Given:  Yes    Sherald Barge, RN 01/19/2016, 10:50 AM

## 2016-01-19 NOTE — Care Management Note (Signed)
Case Management Note  Patient Details  Name: [name] MRN: [id number] Date of Birth: 21-Aug-1941  Subjective/Objective:                  Pt admitted for dyspnea and hypoxia. Pt was previously discharged to [facility] and readmitted same day. Pt plans to return to [facility] for SNF. CSW is aware and working to arrange for return to facility.   Action/Plan: Pt discharging to SNF today. No CM needs.   Expected Discharge Date:    01/19/2016              Expected Discharge Plan:  Skilled Nursing Facility  In-House Referral:  Clinical Social Work  Discharge planning Services  CM Consult  Post Acute Care Choice:  NA Choice offered to:  NA  DME Arranged:    DME Agency:     HH Arranged:    [place] Agency:     Status of Service:  Completed, signed off  Medicare Important Message Given:  Yes Date Medicare IM Given:    Medicare IM give by:    Date Additional Medicare IM Given:    Additional Medicare Important Message give by:     If discussed at [place] of Stay Meetings, dates discussed:    Additional Comments:  Sherald Barge, RN 01/19/2016, 10:50 AM

## 2016-01-19 NOTE — Discharge Summary (Addendum)
Physician Discharge Summary   Patient ID: [name] MRN: [id number] DOB/AGE: [birth year] 75 y.o.  Admit date: 01/15/2016 Discharge date: 01/19/2016  Primary Care Physician:  Tula Nakayama, MD  Discharge Diagnoses:       Acute on chronic hypoxic respiratory failure  .  COPD (chronic obstructive pulmonary disease) (HCC) exacerbation  . suspected aspiration pneumonia    Acute encephalopathy : Resolved    Mildly elevated troponin due to demand ischemia  . Essential hypertension . Hyperlipidemia LDL goal <100 . Memory difficulty . Obstructive sleep apnea . status post right ankle fracture repair on 2/28   Pyuria with Trichomonas- treated  Consults:  None  Recommendations for Outpatient Follow-up:  1. PTOT for rehabilitation 2. Please repeat CBC/BMET at next visit   DIET: Dysphagia 3 diet with thin liquids    Allergies:   Allergies  Allergen Reactions  . Fenofibrate Nausea And Vomiting  . Pravastatin Sodium Nausea And Vomiting  . Sulfonamide Derivatives Other (See Comments)    unknown  . Ciprofloxacin Rash     DISCHARGE MEDICATIONS: Current Discharge Medication List    START taking these medications   Details  amoxicillin-clavulanate (AUGMENTIN) 875-125 MG tablet Take 1 tablet by mouth 2 (two) times daily. X 7days Qty: 14 tablet, Refills: 0    predniSONE (DELTASONE) 10 MG tablet Prednisone dosing: Take  Prednisone 40mg  (4 tabs) x 3 days, then taper to 30mg  (3 tabs) x 3 days, then 20mg  (2 tabs) x 3days, then 10mg  (1 tab) x 3days, then OFF.  Dispense:  30 tabs, refills: None Qty: 30 tablet, Refills: 0      CONTINUE these medications which have CHANGED   Details  albuterol ([name]) (2.5 MG/3ML) 0.083% nebulizer solution Take 3 mLs (2.5 mg total) by nebulization every 4 (four) hours as needed for wheezing or shortness of breath. Qty: 75 mL, Refills: 12    HYDROcodone-acetaminophen (NORCO) 5-325 MG tablet Take 1 tablet by mouth every 4 (four) hours  as needed for moderate pain. Qty: 30 tablet, Refills: 0      CONTINUE these medications which have NOT CHANGED   Details  Albuterol Sulfate (PROAIR RESPICLICK) 123XX123 (90 BASE) MCG/ACT AEPB Inhale 2 puffs into the lungs every 8 (eight) hours as needed. Qty: 1 each, Refills: 11    arformoterol (BROVANA) 15 MCG/2ML NEBU Take 2 mLs (15 mcg total) by nebulization 2 (two) times daily. Qty: 360 mL, Refills: 1   Associated Diagnoses: COPD (chronic obstructive pulmonary disease) (HCC)    aspirin EC 325 MG EC tablet Take 1 tablet (325 mg total) by mouth daily with breakfast. Qty: 30 tablet, Refills: 0    atorvastatin (LIPITOR) 10 MG tablet Take 1 tablet (10 mg total) by mouth daily. Qty: 90 tablet, Refills: 1    bisacodyl (DULCOLAX) 10 MG suppository Place 1 suppository (10 mg total) rectally daily as needed for moderate constipation. Qty: 12 suppository, Refills: 0    calcium-vitamin D (OSCAL WITH D) 500-200 MG-UNIT per tablet Take 1 tablet by mouth daily.     donepezil (ARICEPT) 10 MG tablet Take 1 tablet (10 mg total) by mouth at bedtime. Qty: 90 tablet, Refills: 1   Associated Diagnoses: Memory difficulty    FLUoxetine (PROZAC) 40 MG capsule Take 1 capsule (40 mg total) by mouth daily. Qty: 90 capsule, Refills: 1    loperamide (IMODIUM) 2 MG capsule Take 2 mg by mouth daily.    loratadine (CLARITIN) 10 MG tablet Take 10 mg by mouth daily.    Multiple  Vitamins-Minerals (CEROVITE SENIOR) TABS Take 1 tablet by mouth daily.     omeprazole (PRILOSEC) 40 MG capsule Take 1 capsule (40 mg total) by mouth daily. Qty: 90 capsule, Refills: 1    polyethylene glycol (MIRALAX / GLYCOLAX) packet Take 17 g by mouth daily. Qty: 14 each, Refills: 0    verapamil (CALAN-SR) 240 MG CR tablet Take 1 tablet (240 mg total) by mouth at bedtime. Qty: 90 tablet, Refills: 1    Misc. Devices [facility]) MISC Use as needed Qty: 1 each, Refills: 0         Brief H and P: For complete details please  refer to admission H and P, but in brief75yow PMH COPD, OSA noncompliant, s/p ankle fx repair 2/28 and d/c to SNF 3/2 presented to ED 3/3 with hypoxia and confusion. Admitted for COPD exacerbation, acute on chronic hypox resp failure.  Hospital Course:  1. Acute hypoxic respiratory failure requiring venimask, now on nasal cannula. - Flu PCR negative. Lactate normal. - CTA chest no PE; bibasilar atelectasis, pneumonia less likely radiographically.  - patient was placed on Zosyn, transitioned to oral Augmentin on 3/4.  - wean O2 as tolerated, O2 sats 94% on 3 L. Per patient chronically on 2 L  2. COPD exacerbation: Resolved, continue Augmentin, albuterol bronchodilators, brovana, steroid taper 3. Suspected aspiration pneumonia based on clinical history:  - patient was placed on IV Zosyn, transitioned to oral Augmentin on 3/4.  4. Chronic hypercapneic respiratory failure, appears stable. She is awake, alert and follows commands. Wean O2 as tolerated. 5. Acute encephalopathy, appears resolved, currently at baseline. 6. Elevated troponin, minimal elevation, suspect demand ischemia from acute illness, no evidence of ACS. No complaints of chest pain. EKG showed sinus tachycardia. Prolonged QT. 7. Pyuria with trichomonas present. Treated with Flagyl 2 grams x1 8. s/p right ankle fx repair on 01/13/16. Continue PTOT 9. Anemia of chronic disease, stable. 10. PMH COPD, CHF, OSA (noncompliant), dysphagia, h/o esophageal stricture s/p dilitation. 11. Dementia  12. Prolonged QT. QTC 491, follow closely  Day of Discharge BP 132/71 mmHg  Pulse 88  Temp(Src) 98.5 F (36.9 C) (Oral)  Resp 20  Ht 6\' 1"  (1.854 m)  Wt 110.995 kg (244 lb 11.2 oz)  BMI 32.29 kg/m2  SpO2 95%  Physical Exam: General: Alert and awake oriented x3 not in any acute distress. HEENT: anicteric sclera, pupils reactive to light and accommodation CVS: S1-S2 clear no murmur rubs or gallops Chest: clear to auscultation bilaterally,  no wheezing rales or rhonchi Abdomen: soft nontender, nondistended, normal bowel sounds Extremities: no cyanosis, clubbing or edema noted bilaterally, right lower extremity dressing intact Neuro: Cranial nerves II-XII intact, no focal neurological deficits   The results of significant diagnostics from this hospitalization (including imaging, microbiology, ancillary and laboratory) are listed below for reference.    LAB RESULTS: Basic Metabolic Panel:  Recent Labs Lab 01/15/16 2155 01/16/16 0426  NA 140 143  K 3.9 4.0  CL 104 103  CO2 30 31  GLUCOSE 144* 153*  BUN 19 19  CREATININE 0.97 0.88  CALCIUM 8.7* 8.8*   Liver Function Tests:  Recent Labs Lab 01/16/16 0426  AST 40  ALT 17  ALKPHOS 92  BILITOT 0.4  PROT 6.7  ALBUMIN 3.3*   No results for input(s): LIPASE, AMYLASE in the last 168 hours. No results for input(s): AMMONIA in the last 168 hours. CBC:  Recent Labs Lab 01/15/16 2155 01/16/16 0426  WBC 9.7 9.2  NEUTROABS 8.6*  --  HGB 9.6* 9.3*  HCT 32.0* 30.8*  MCV 89.4 89.3  PLT 206 219   Cardiac Enzymes:  Recent Labs Lab 01/15/16 2155 01/16/16 0927  TROPONINI 0.06* 0.04*   BNP: Invalid input(s): POCBNP CBG: No results for input(s): GLUCAP in the last 168 hours.  Significant Diagnostic Studies:  Ct Angio Chest Pe W/cm &/or Wo Cm  01/15/2016  CLINICAL DATA:  75 year old female with shortness of breath. Recent ankle surgery for fracture. EXAM: CT ANGIOGRAPHY CHEST WITH CONTRAST TECHNIQUE: Multidetector CT imaging of the chest was performed using the standard protocol during bolus administration of intravenous contrast. Multiplanar CT image reconstructions and MIPs were obtained to evaluate the vascular anatomy. CONTRAST:  170mL OMNIPAQUE IOHEXOL 350 MG/ML SOLN COMPARISON:  Radiograph dated 01/15/2016 FINDINGS: There is mild emphysematous changes of the lungs. Bibasilar subpleural subsegmental atelectatic changes noted. There is no pleural effusion or  pneumothorax. The central airways are patent. There is atherosclerotic calcification of the thoracic aorta. No CT evidence of pulmonary embolism. Mild cardiomegaly. No pericardial effusion. There is coronary vascular calcification. There are bilateral hilar adenopathy. Small amount of fluid noted within the esophagus. There is no axillary adenopathy. The chest wall soft tissues appear unremarkable. There is degenerative changes of the spine. No acute fracture. The visualized upper abdomen appears unremarkable. Review of the MIP images confirms the above findings. IMPRESSION: No CT evidence of pulmonary embolism. Bibasilar subsegmental atelectatic changes. Pneumonia is less likely. Mildly enlarged bilateral hilar lymph nodes may be reactive. Clinical correlation is recommended. Electronically Signed   By: Anner Crete M.D.   On: 01/15/2016 23:42   Dg Chest Portable 1 View  01/15/2016  CLINICAL DATA:  Acute onset of decreased level of consciousness. Hypoxia and decreased O2 saturation. Status post recent ankle surgery. Initial encounter. EXAM: PORTABLE CHEST 1 VIEW COMPARISON:  Chest radiograph performed 06/22/2014 FINDINGS: The lungs are well-aerated. Mild vascular congestion is noted. Minimal bibasilar atelectasis is seen. There is no evidence of pleural effusion or pneumothorax. The cardiomediastinal silhouette is mildly enlarged. No acute osseous abnormalities are seen. IMPRESSION: Mild vascular congestion and mild cardiomegaly. Minimal bibasilar atelectasis seen. Electronically Signed   By: Garald Balding M.D.   On: 01/15/2016 21:51    2D ECHO:   Disposition and Follow-up: Discharge Instructions    Discharge instructions    Complete by:  As directed   DYSPHAGIA 3, mechanical soft diet, thin liquids     Increase activity slowly    Complete by:  As directed             DISPOSITION: Skilled nursing facility   DISCHARGE FOLLOW-UP Follow-up Information    Follow up with Tula Nakayama,  MD. Schedule an appointment as soon as possible for a visit in 10 days.   Specialty:  Family Medicine   Why:  for hospital follow-up   Contact information:   [address] [place] [address] [phone]        Time spent on Discharge: 35 minutes  Signed:   RAI,RIPUDEEP M.D. [facility] 01/19/2016, 8:50 AM Pager: [phone]

## 2016-01-19 NOTE — Progress Notes (Signed)
Patient see and examined, ready for discharge. DC summary updated this AM. No further changes.    RAI,RIPUDEEP M.D. [facility] 01/19/2016, 8:53 AM  Pager: [phone]

## 2016-01-19 NOTE — Progress Notes (Signed)
Patient transferred to [place] given to [facility] RN. Vital signs stable. Accompanied by staff to an awaiting floor.[name]

## 2016-01-19 NOTE — Plan of Care (Signed)
Problem: Acute Rehab PT Goals(only PT should resolve) Goal: Patient Will Transfer Sit To/From Stand Pt will transfer sit to/from-stand with RW at [place] without loss-of-balance and maintaining RLE NWB to demonstrate good safety awareness for independent mobility in home.     Goal: Pt Will Ambulate Pt will ambulate with RW at minA using a LLE hop-to pattern for a distance greater than 59ft to demonstrate the ability to perform safe household distance ambulation at discharge.  Goal: Pt/caregiver will Perform Home Exercise Program Pt will demonstrate indep in HEP for continued progress in strength after DC.

## 2016-01-19 NOTE — Clinical Social Work Note (Signed)
CSW notified Keri at [facility] of patient's discharge.  CSW contacted Sharyn Lull at [facility] to obtain patient's authorization.  CSW left patient's daughter, [name], a [employer] informing of patient's discharge.  CSW sent clinicals via [employer].    CSW signing off.   Coty Student, Clydene Pugh, LCSW

## 2016-01-19 NOTE — Evaluation (Signed)
Physical Therapy Evaluation Patient Details Name: [name] MRN: [id number] DOB: Jun 22, 1941 Today's Date: 01/19/2016   History of Present Illness  75yo black female comes to [facility] on 3/3 from [facility] after found to be hoxic (50's%) by staff, and 83% on 4L with EMS. Pt has recently been DC from [facility] to [facility] after R ankle ORIF. PMH: COPD, CHF, OSA, dementia, HTN, HLD, and R ankle ORIF (2/28) NWB x12weeks. Pt is chronically on O2 at home.   Clinical Impression  VSS stable with all activity and exertion. Pt performing bed mobility with modified indep, but requires mod assist to stand at EOB, attempted thrice. Pt able to maintain RLE NWB intermittently, ut lacks sufficient strength in LLE for indep stance at this time, with heavy reliance on BUE on RW and persistent L knee buckling. Pt demonstrating impaired strength, balance, ROM, and functional indep in ADL and mobility, and will benefit from skilled PT intervention to restore to PLOF.     Follow Up Recommendations SNF    Equipment Recommendations  None recommended by PT    Recommendations for Other Services       Precautions / Restrictions Precautions Precautions: None Restrictions Weight Bearing Restrictions: Yes RLE Weight Bearing: Non weight bearing Other Position/Activity Restrictions: Pt is RLE NWB x12 weeks per op note 2/28 Dr. Aline Brochure.       Mobility  Bed Mobility Overal bed mobility: Modified Independent                Transfers Overall transfer level: Needs assistance Equipment used: Rolling walker (2 wheeled)   Sit to Stand: Mod assist;From elevated surface         General transfer comment: very elevated surface due to pt's height. Performed three times for therex. difficulty maintaining TKE in stance; able ot maintain RLE in NWB intermitently, but limited by weakness.   Ambulation/Gait Ambulation/Gait assistance:  (unable. )              Stairs            Wheelchair Mobility    Modified  [place] (Stroke Patients Only)       Balance Overall balance assessment: No apparent balance deficits (not formally assessed);Needs assistance   Sitting balance-Leahy Scale: Good       Standing balance-Leahy Scale: Poor Standing balance comment: L knee buckling.                              Pertinent Vitals/Pain Pain Assessment: No/denies pain    Home Living Family/patient expects to be discharged to:: Skilled nursing facility                      Prior Function                 Hand Dominance        Extremity/Trunk Assessment                         Communication      Cognition Arousal/Alertness: Awake/alert Behavior During Therapy: WFL for tasks assessed/performed Overall Cognitive Status: Within Functional Limits for tasks assessed (Oriented x3)       Memory: Decreased short-term memory (unable to explain how she broke her ankle 6 weeks ago. )              General Comments      Exercises  Assessment/Plan    PT Assessment Patient needs continued PT services  PT Diagnosis Difficulty walking;Abnormality of gait;Generalized weakness   PT Problem List Decreased strength;Decreased coordination;Pain;Decreased activity tolerance;Decreased knowledge of use of DME;Decreased balance;Decreased safety awareness;Decreased mobility;Decreased knowledge of precautions;Decreased range of motion  PT Treatment Interventions DME instruction;Therapeutic exercise;Gait training;Balance training;Stair training;Neuromuscular re-education;Functional mobility training;Therapeutic activities;Patient/family education   PT Goals (Current goals can be found in the Care Plan section) Acute Rehab PT Goals Patient Stated Goal: improve strength  PT Goal Formulation: With patient Time For Goal Achievement: 02/02/16 Potential to Achieve Goals: Good    Frequency 7X/week   Barriers to discharge Inaccessible home environment;Decreased  caregiver support      Co-evaluation               End of Session Equipment Utilized During Treatment: Gait belt;Oxygen Activity Tolerance: Patient tolerated treatment well;Patient limited by fatigue Patient left: in bed;with call bell/phone within reach;with bed alarm set Nurse Communication: Mobility status         Time: [id number] PT Time Calculation (min) (ACUTE ONLY): 12 min   Charges:   PT Evaluation $PT Eval Low Complexity: 1 Procedure PT Treatments $Therapeutic Activity: 8-22 mins   PT G Codes:        2:45 PM, 02-07-16 Etta Grandchild, PT, DPT PRN Physical Therapist at [place] License # [id number] (wireless)  [phone] (mobile)

## 2016-01-20 ENCOUNTER — Inpatient Hospital Stay
Admission: RE | Admit: 2016-01-20 | Discharge: 2016-02-06 | Disposition: A | Discharge status: Home / Self Care | Payer: Medicare PPO | Source: Ambulatory Visit | Attending: Internal Medicine | Admitting: Internal Medicine

## 2016-01-20 DIAGNOSIS — T148XXA Other injury of unspecified body region, initial encounter: Principal | ICD-10-CM

## 2016-01-21 ENCOUNTER — Encounter (HOSPITAL_COMMUNITY)
Admission: AD | Admit: 2016-01-21 | Discharge: 2016-01-21 | Disposition: A | Discharge status: Home / Self Care | Payer: Medicare PPO | Source: Skilled Nursing Facility | Attending: Internal Medicine | Admitting: Internal Medicine

## 2016-01-21 ENCOUNTER — Non-Acute Institutional Stay: Payer: Medicare PPO | Admitting: Internal Medicine

## 2016-01-21 DIAGNOSIS — D638 Anemia in other chronic diseases classified elsewhere: Secondary | ICD-10-CM

## 2016-01-21 DIAGNOSIS — J9601 Acute respiratory failure with hypoxia: Secondary | ICD-10-CM | POA: Diagnosis not present

## 2016-01-21 DIAGNOSIS — S82841D Displaced bimalleolar fracture of right lower leg, subsequent encounter for closed fracture with routine healing: Secondary | ICD-10-CM | POA: Diagnosis not present

## 2016-01-21 DIAGNOSIS — J9611 Chronic respiratory failure with hypoxia: Secondary | ICD-10-CM | POA: Insufficient documentation

## 2016-01-21 DIAGNOSIS — M1990 Unspecified osteoarthritis, unspecified site: Secondary | ICD-10-CM | POA: Insufficient documentation

## 2016-01-21 DIAGNOSIS — I10 Essential (primary) hypertension: Secondary | ICD-10-CM | POA: Diagnosis not present

## 2016-01-21 DIAGNOSIS — J449 Chronic obstructive pulmonary disease, unspecified: Secondary | ICD-10-CM | POA: Insufficient documentation

## 2016-01-21 DIAGNOSIS — Z4789 Encounter for other orthopedic aftercare: Secondary | ICD-10-CM | POA: Insufficient documentation

## 2016-01-21 DIAGNOSIS — K219 Gastro-esophageal reflux disease without esophagitis: Secondary | ICD-10-CM | POA: Insufficient documentation

## 2016-01-21 LAB — BASIC METABOLIC PANEL
ANION GAP: 8 (ref 5–15)
BUN: 22 mg/dL — AB (ref 6–20)
CALCIUM: 9.3 mg/dL (ref 8.9–10.3)
CO2: 33 mmol/L — ABNORMAL HIGH (ref 22–32)
Chloride: 99 mmol/L — ABNORMAL LOW (ref 101–111)
Creatinine, Ser: 0.82 mg/dL (ref 0.44–1.00)
GFR calc Af Amer: >60 mL/min (ref >60)
GLUCOSE: 96 mg/dL (ref 65–99)
POTASSIUM: 3.6 mmol/L (ref 3.5–5.1)
SODIUM: 140 mmol/L (ref 135–145)

## 2016-01-21 LAB — CBC WITH DIFFERENTIAL/PLATELET
BASOS ABS: 0 10*3/uL (ref 0.0–0.1)
BASOS PCT: 0 %
Eosinophils Absolute: 0.2 10*3/uL (ref 0.0–0.7)
Eosinophils Relative: 2 %
HCT: 37 % (ref 36.0–46.0)
Hemoglobin: 11.2 g/dL — ABNORMAL LOW (ref 12.0–15.0)
Lymphocytes Relative: 16 %
Lymphs Abs: 1.5 10*3/uL (ref 0.7–4.0)
MCH: 26.4 pg (ref 26.0–34.0)
MCHC: 30.3 g/dL (ref 30.0–36.0)
MCV: 87.1 fL (ref 78.0–100.0)
MONOS PCT: 4 %
Monocytes Absolute: 0.4 10*3/uL (ref 0.1–1.0)
NEUTROS ABS: 7.4 10*3/uL (ref 1.7–7.7)
NEUTROS PCT: 78 %
PLATELETS: 359 10*3/uL (ref 150–400)
RBC: 4.25 MIL/uL (ref 3.87–5.11)
RDW: 15.3 % (ref 11.5–15.5)
WBC: 9.4 10*3/uL (ref 4.0–10.5)

## 2016-01-21 NOTE — Progress Notes (Signed)
Patient ID: [name], female   DOB: 1941/08/03, 75 y.o.   MRN: [id number]   This is an acute visit.  Level care skilled.  Facility [facility].  Chief complaint-acute visit status post hospitalization for acute on chronic hypoxic respiratory failure-COPD exasperation.  History of present illness.  Patient is a pleasant 75 year old female with a history of COPD status post ankle fracture repair February 28.  She was discharged to skilled nursing on March 2 are presented to the emergency department on the next day with hypoxia and confusion.  She was omitted for COPD exasperation acute on chronic hypoxic respiratory failure.  Initially she required a Ventimask now on nasal cannula.  Chest x-ray was negative for any pulmonary embolism there was some suspicion she may have some aspiration here.  She was placed initially on Zosyn and transitioned to oral Augmentin on March 4.  Patient continues on chronic oxygen.  In regards to COPD exasperation this resolved she is on Augmentin as well as albuterol bronchodilators Brovana and steroid taper.  Patient did have originally some encephalopathy apparently this resolved she does have some baseline what appears to be mild dementia.  Patient also was found to have pyuria with Trichomonas present she was treated with Flagyl 2 g 1 dose.  Regards to her right ankle fracture appear on February 28 she will need continued PT and OT as well as orthopedic follow-up.  Patient was found to have a prolonged QT on EKG recommendation to follow this.  Currently she has no complaints she is sitting in her wheelchair comfortably appears to be stable.  Previous medical history.  Acute on chronic respiratory failure a positive.  COPD.  Suspected aspiration pneumonia.  Acute encephalopathy resolved.  Mildly elevated troponin thought due to demand ischemia.  Hypertension.  Hyperlipidemia.  Memory difficulty.  Obstructive sleep  apnea.  Status post right ankle fracture repair on February 28.  Pyuria with Trichomonas that was treated.  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  Also involving C3-C3 7.  Allergic rhinitis.  History of colon surgery rather right hemo-colectomy for high-grade adenomas of the right colon in 2013.  History of mild dementia.    Surgical history.  ORIF ankle fracture on the right in March 2013.  Abdominal hysterectomy in 1976.  Oophorectomy in 1976.  History of appendectomy.  Colon resection October 2013.  ORIF of ankle fracture again on 01/13/2016.  Social history.  She quit smoking about 9 years ago this includes cigarettes-she has a 60-pack-year smoking history.   Has never used smokeless tobacco or significant alcohol or illicit drug use.  Family history significant for stomach cancer father.  Brain cancer of her mother as well as breast cancer.  Medications.  Augmentin 875-125 mg-one tab twice a day for 7 days.  Prednisone taper starting at 40 mg for 3 days then down 10 mg every 3 days until discontinuance.  Albuterol nebulizers every 4 hours when necessary.  Vicodin 5-3 25 mg every 4 hours when necessary.  Albuterol inhaler 2 puffs in the lungs every 8 hours when necessary.  Brovana twice a day.  Aspirin enteric-coated 325 mg daily.  Lipitor 10 mg by mouth daily.  Ducolax suppository when necessary.  Calcium with vitamin D 500-200 L grams-daily.  Aricept 10 mg daily at bedtime.  Prozac 40 mg daily.  Imodium 2 mg daily.  Claritin 10 mg daily.  Multivitamin daily.  Prilosec 40 mg daily.  MiraLAX daily.  Verapamil 240 mg controlled release  daily at bedtime.    Review of systems.  In general does not complaining of fever chills says she feels relatively well.  Skin does not complain of rashes or itching.  Eyes does not complain of visual changes.  Ear nose mouth and throat does not complaining of any  sore throat or nasal discharge.  Cardiac does not complain of chest pain or palpitations respiratory significant history as noted above but says her breathing is getting better does not really complain of shortness of breath today or significantly increased cough.  GI does not complain of nausea vomiting diarrhea constipation or abdominal pain.  GU does not complaining of dysuria.  Muscle skeletal is status post right ankle fracture with repair but pain at this point appears to be relatively well controlled.  Neurologic is not complaining of dizziness headache or numbness.   psych is not complaining of depression or anxiety appears to have a history of depression she is on Prozac  Physical exam.  Temperature is 98.5 pulse 80 respirations 20 blood pressure 142/60.  In general this is a pleasant elderly female in no distress sitting comfortably in her wheelchair.  Her skin is warm and dry.  Eyes she has prescription lenses visual acuity appears to be grossly intact.  Oropharynx clear mucous membranes moist.  Chest is clear to auscultation with somewhat shallow air entry there is no labored breathing.  Heart is regular irregular rate and rhythm without murmur gallop or rub she does not have significant lower extremity edema on the left right lower extremity is currently wrapped with a history of the ankle fracture.  Abdomen soft obese soft nontender positive bowel sounds.  Muscle skeletal is able to move all  Extremities x  4 with limitation of the right lower extremity secondary to the ankle fracture  Neurologic is grossly intact speech is clear no lateralizing findings.  Psych she appears able to give a fairly clear history--I suspect her dementia is quite mild.  Labs.  01/21/2016.  Sodium 140 potassium 3.6 BUN 22 creatinine 0.82.  WBC 9.4 hemoglobin 11.2 platelets 359.  Assessment plan.  History of respiratory failure this appears to have stabilized she does continue  on Augmentin for a week course secondary to concerns of aspiration pneumonia COPD exasperation she is also on a prednisone taper.  Continues on albuterol nebulizers as needed as well as inhaler and Brovana.  #2 history of acute encephalopathy-she does have some history of mild dementia but appears to be largely oriented pleasant ringer conversation today this encephalopathy appears to have resolved suspect this was due to the acute episode as noted above.  History of right ankle fracture with repair on February 28 this when he followed by orthopedics at this point pain appears to be controlled she does continue on Norco as needed.  #4-history of hypertension-she continues on verapamil systolic mildly elevated at 142 today at this point will monitor would like to get more readings.  #5-history of depression this appears stable on Prozac.  #6 dementia as noted above this is quite mild she is on Aricept.  #7 history of anemia thought to be chronic disease this apparently has been stable most recent hemoglobin 11.2 Will update this next week.  History hyperlipidemia she continues on a statin-liver function tests on March 3 were within normal limits except albumin of 3.3 since her stay here at suspect we'll be fairly short will not be aggressive pursuing a lipid panel-.  #9-and history of allergic rhinitis she continues on  Claritin this appears to be stable.  Of note Will update a metabolic panel next week when we obtain CBC to ensure stability as well.  [id number] note greater than 40 minutes spent assessing patient-reviewing her chart-and coordinating and formulating a  plan a care for numerous diagnoses-of note greater than 50% of time spent coordinating plan of care

## 2016-01-23 ENCOUNTER — Ambulatory Visit (INDEPENDENT_AMBULATORY_CARE_PROVIDER_SITE_OTHER): Payer: Self-pay | Admitting: Orthopedic Surgery

## 2016-01-23 VITALS — BP 145/77 | Ht 73.0 in | Wt 244.0 lb

## 2016-01-23 DIAGNOSIS — S82841P Displaced bimalleolar fracture of right lower leg, subsequent encounter for closed fracture with malunion: Secondary | ICD-10-CM

## 2016-01-23 NOTE — Progress Notes (Signed)
Chief Complaint  Patient presents with  . Follow-up    Post op #1, right ankle, ORIF. DOS 01-13-16.    Status post ORIF right ankle wound check. Wound looks clean dry and intact staple line is good return to leave those in until Tuesday when we take the x-rays of the ankle show go back in a Cam Walker no weightbearing she is at the [facility]

## 2016-01-23 NOTE — Patient Instructions (Signed)
Put boot back on, no weight bearing

## 2016-01-24 ENCOUNTER — Non-Acute Institutional Stay: Payer: Medicare PPO | Admitting: Internal Medicine

## 2016-01-24 DIAGNOSIS — J209 Acute bronchitis, unspecified: Secondary | ICD-10-CM

## 2016-01-24 DIAGNOSIS — J440 Chronic obstructive pulmonary disease with acute lower respiratory infection: Secondary | ICD-10-CM

## 2016-01-24 DIAGNOSIS — S82841D Displaced bimalleolar fracture of right lower leg, subsequent encounter for closed fracture with routine healing: Secondary | ICD-10-CM | POA: Diagnosis not present

## 2016-01-24 DIAGNOSIS — J690 Pneumonitis due to inhalation of food and vomit: Secondary | ICD-10-CM

## 2016-01-24 DIAGNOSIS — G4733 Obstructive sleep apnea (adult) (pediatric): Secondary | ICD-10-CM | POA: Diagnosis not present

## 2016-01-24 NOTE — Progress Notes (Signed)
Patient ID: [name], female   DOB: [birth year], 75 y.o.   MRN: [id number]    Facility; [facility] Chief complaint; admission to SNF post admit to [facility] from 3/2 to 01/20/16  History; this is a patient who was hospitalized from 2/28 through 3/2 after having a bimalleolar right ankle fracture. This was repaired on 2/28 by Dr. Aline Brochure. She was transferred to this nursing home and on that night that developed increasing lethargy and declining O2 sats in the 50s. She was sent back to hospital. A CT of the chest did not show evidence of pulmonary embolism. She did have slightly elevated troponins. She was treated for an exacerbation of COPD. The patient has baseline COPD on chronic home O2. She was sent back here for further rehabilitation. She is on a prednisone taper. Discharged on Augmentin for a further 7 days.  CBC Latest Ref Rng 01/21/2016 01/16/2016 01/15/2016  WBC 4.0 - 10.5 K/uL 9.4 9.2 9.7  Hemoglobin 12.0 - 15.0 g/dL 11.2(L) 9.3(L) 9.6(L)  Hematocrit 36.0 - 46.0 % 37.0 30.8(L) 32.0(L)  Platelets 150 - 400 K/uL 359 219 206   BMP Latest Ref Rng 01/21/2016 01/16/2016 01/15/2016  Glucose 65 - 99 mg/dL 96 153(H) 144(H)  BUN 6 - 20 mg/dL 22(H) 19 19  Creatinine 0.44 - 1.00 mg/dL 0.82 0.88 0.97  Sodium 135 - 145 mmol/L 140 143 140  Potassium 3.5 - 5.1 mmol/L 3.6 4.0 3.9  Chloride 101 - 111 mmol/L 99(L) 103 104  CO2 22 - 32 mmol/L 33(H) 31 30  Calcium 8.9 - 10.3 mg/dL 9.3 8.8(L) 8.7(L)   Past Medical History  Diagnosis Date  . Allergic rhinitis   . Anemia   . Anxiety   . Depression   . GERD (gastroesophageal reflux disease)   . Hypertension   . Low back pain   . Arthritis     abnormal gait   . OSA on CPAP   . Degenerative disc disease, lumbar     L5 nerve impingement   . 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 Arm fracture, left   . Onychomycosis   . Ankle fracture, right   . Hyperglycemia   . Mediastinal lymphadenopathy 1/9    resolving   . COPD (chronic  obstructive pulmonary disease) (HCC)     chronic CO2 retention, decreased DLCO   . Cystic acne     adult   . Sleep apnea     stop bang score 6  . History of colon surgery     right hemicolectomy for high-grade adenomas in right colon 2013  . Abnormality of gait 06/06/2014  . Memory difficulty 09/20/2014    mild dementia  . DNR (do not resuscitate)    Past Surgical History  Procedure Laterality Date  . Abdominal hysterectomy  1976    secondary to bleeding   . Oophorectomy  1976  . Epidural steroids    . Orif ankle fracture  01/18/2012    Procedure: OPEN REDUCTION INTERNAL FIXATION (ORIF) ANKLE FRACTURE;  Surgeon: Arther Abbott, MD;  Location: [facility];  Service: Orthopedics;  Laterality: Left;  . Colonoscopy  07/20/2012    Dr. Gala Romney: multiple colonic polyps with large polyps on right side, s/p saline-assisted debulking piecemeal polypectomy and ablation. Not all removed. Path with tubulovillous adenomas, high grade dysplasia.   [name] Appendectomy    . Colon resection  08/18/2012    Procedure: HAND ASSISTED LAPAROSCOPIC COLON RESECTION;  Surgeon: Jamesetta So, MD;  Location: [facility];  Service: General;  Laterality: N/A;  . Colonoscopy N/A 03/09/2013    [id number] polyps-tubular adenomas. S/p right hemicolectomy. next tcs 02/2018  . Esophagogastroduodenoscopy (egd) with esophageal dilation N/A 02/06/2014    Procedure: ESOPHAGOGASTRODUODENOSCOPY (EGD) WITH ESOPHAGEAL DILATION;  Surgeon: Daneil Dolin, MD;  Location: [facility];  Service: Endoscopy;  Laterality: N/A;  9:30  . Orif ankle fracture Right 01/13/2016    Procedure: OPEN REDUCTION INTERNAL FIXATION (ORIF) ANKLE FRACTURE;  Surgeon: Carole Civil, MD;  Location: [facility];  Service: Orthopedics;  Laterality: Right;   Social history; patient tells me she lives in [place]. She lives alone although her daughter lives nearby and it sounds as though there is a lot of in-home assistance. She is on chronic oxygen. I don't have a  sense of her fall history although I see she has had a previous left ankle fracture. I don't have a sense of her functional level at home.  reports that she quit smoking about 9 years ago. Her smoking use included Cigarettes. She has a 60 pack-year smoking history. She has never used smokeless tobacco. She reports that she does not drink alcohol or use illicit drugs.  indicated that her mother is deceased. She indicated that her father is deceased. She indicated that two of her three sisters are alive. She indicated that all of her three brothers are alive. She indicated that all of her three daughters are alive. She indicated that her son is alive.   Review of systems Respiratory no current complaints of cough or shortness of breath no wheezing Cardiac no chest pain GI no abdominal pain or diarrhea GU no dysuria Musculoskeletal some pain in the right ankle but no other musculoskeletal pain complaints  Physical examination Gen. the patient is sitting up in her wheelchair has no specific complaints Vitals; O2 sat is 97% on 2 L respirations 18 and unlabored pulse 77 HEENT tongue is slightly coated but I see no thrush. Respiratory shallow air entry mildly prolonged expiratory phase but no wheezing Cardiac heart sounds are normal no murmurs she appears to be euvolemic Abdomen no liver no spleen no tenderness no masses. Somewhat distended however bowel sounds are positive she is nontender GU; no suprapubic or costovertebral angle tenderness Musculoskeletal; right ankle is in a Coban wrap. No other active joints Extremities peripheral pulses are palpable there is no edema and no concerned about a DVT Mental status; no evidence of delirium or depression. The patient obviously has some memory issues but is orientated to the year place and month.  Impression/plan #1 status post right ankle ORIF for a bimalleolar fracture. She is nonweightbearing she is on aspirin 325 #2 COPD acute this seems more  stable she is on a prednisone taper, Brovana. 2 L of oxygen. She appears to be very stable #3 felt to have aspiration pneumonia was treated with Zosyn and transitioned to oral Augmentin. #4 elevated troponin felt to be demand ischemia. EKGs showed sinus tachycardia with a prolonged QT. #5 obstructive sleep apnea with CPAP. Apparently she is noncompliant with this #6 history of dysphagia with esophageal stricture I didn't pick up on any issues about this currently #7 acute delirium resolved with treatment of the pulmonary issues. #8 dementia on Aricept  #9 her exact functional status at home is unclear. I didn't know she was on oxygen.

## 2016-01-26 ENCOUNTER — Other Ambulatory Visit (HOSPITAL_COMMUNITY)
Admission: RE | Admit: 2016-01-26 | Discharge: 2016-01-26 | Disposition: A | Discharge status: Home / Self Care | Payer: Medicare PPO | Source: Skilled Nursing Facility | Attending: Internal Medicine | Admitting: Internal Medicine

## 2016-01-26 DIAGNOSIS — J9611 Chronic respiratory failure with hypoxia: Secondary | ICD-10-CM | POA: Insufficient documentation

## 2016-01-26 LAB — CBC WITH DIFFERENTIAL/PLATELET
Basophils Absolute: 0 10*3/uL (ref 0.0–0.1)
Basophils Relative: 0 %
EOS PCT: 3 %
Eosinophils Absolute: 0.2 10*3/uL (ref 0.0–0.7)
HCT: 34.2 % — ABNORMAL LOW (ref 36.0–46.0)
Hemoglobin: 10.3 g/dL — ABNORMAL LOW (ref 12.0–15.0)
LYMPHS ABS: 1.4 10*3/uL (ref 0.7–4.0)
LYMPHS PCT: 17 %
MCH: 26.5 pg (ref 26.0–34.0)
MCHC: 30.1 g/dL (ref 30.0–36.0)
MCV: 88.1 fL (ref 78.0–100.0)
MONO ABS: 0.6 10*3/uL (ref 0.1–1.0)
Monocytes Relative: 7 %
Neutro Abs: 6.2 10*3/uL (ref 1.7–7.7)
Neutrophils Relative %: 73 %
PLATELETS: 332 10*3/uL (ref 150–400)
RBC: 3.88 MIL/uL (ref 3.87–5.11)
RDW: 15.6 % — AB (ref 11.5–15.5)
WBC: 8.5 10*3/uL (ref 4.0–10.5)

## 2016-01-26 LAB — BASIC METABOLIC PANEL
Anion gap: 6 (ref 5–15)
BUN: 17 mg/dL (ref 6–20)
CALCIUM: 9.3 mg/dL (ref 8.9–10.3)
CO2: 35 mmol/L — ABNORMAL HIGH (ref 22–32)
Chloride: 100 mmol/L — ABNORMAL LOW (ref 101–111)
Creatinine, Ser: 0.84 mg/dL (ref 0.44–1.00)
GFR calc Af Amer: >60 mL/min (ref >60)
GLUCOSE: 81 mg/dL (ref 65–99)
Potassium: 3.9 mmol/L (ref 3.5–5.1)
Sodium: 141 mmol/L (ref 135–145)

## 2016-01-27 ENCOUNTER — Ambulatory Visit (HOSPITAL_COMMUNITY): Payer: Medicare PPO | Attending: Orthopedic Surgery

## 2016-01-27 ENCOUNTER — Ambulatory Visit (INDEPENDENT_AMBULATORY_CARE_PROVIDER_SITE_OTHER): Payer: Self-pay | Admitting: Orthopedic Surgery

## 2016-01-27 VITALS — BP 122/70 | HR 83 | Ht 73.0 in | Wt 244.0 lb

## 2016-01-27 DIAGNOSIS — X58XXXD Exposure to other specified factors, subsequent encounter: Secondary | ICD-10-CM | POA: Insufficient documentation

## 2016-01-27 DIAGNOSIS — S82891D Other fracture of right lower leg, subsequent encounter for closed fracture with routine healing: Secondary | ICD-10-CM | POA: Diagnosis not present

## 2016-01-27 DIAGNOSIS — Z9889 Other specified postprocedural states: Secondary | ICD-10-CM | POA: Insufficient documentation

## 2016-01-27 DIAGNOSIS — S82891A Other fracture of right lower leg, initial encounter for closed fracture: Secondary | ICD-10-CM | POA: Diagnosis not present

## 2016-01-27 DIAGNOSIS — S82841P Displaced bimalleolar fracture of right lower leg, subsequent encounter for closed fracture with malunion: Secondary | ICD-10-CM

## 2016-01-27 DIAGNOSIS — T148 Other injury of unspecified body region: Secondary | ICD-10-CM | POA: Diagnosis present

## 2016-01-27 NOTE — Progress Notes (Signed)
Patient ID: [name], female   DOB: 1940/12/02, 75 y.o.   MRN: [id number]  Chief Complaint  Patient presents with  . Follow-up    Right ankle    HPI right ankle fracture open treatment internal fixation February 28 postoperative day 13 staples come out today  ROS  BP 122/70 mmHg  Pulse 83  Ht 6\' 1"  (1.854 m)  Wt 244 lb (110.678 kg)  BMI 32.20 kg/m2  The film was done at the hospital as a medial plate 1 medial screw the plate as a buttress plate is also a lateral fibular plate  Near-anatomic reduction slight subluxation of the mortise, however at this point I don't think it's warranted to go back and try to do anything different to this fracture is stable it is healing the wound is stable we will follow it and keep her nonweightbearing probably for the 12 week duration    ASSESSMENT AND PLAN   Stable fixation wound stable follow-up 4 weeks x-ray ankle

## 2016-01-27 NOTE — Patient Instructions (Signed)
The patient will continue nonweightbearing for the next 4 weeks  The patient should have the Cam Walker on as if it were cast with daily skin checks for any problems  The patient will follow up in the office in 4 weeks for x-rays  The Ace bandage should be kept on to control swelling please start the Ace bandage were the toes meet the foot to prevent distal edema  Call us if any problems develop

## 2016-02-02 ENCOUNTER — Ambulatory Visit: Payer: Medicare PPO | Admitting: Family Medicine

## 2016-02-03 ENCOUNTER — Ambulatory Visit: Payer: Medicare PPO | Admitting: Orthopedic Surgery

## 2016-02-05 ENCOUNTER — Non-Acute Institutional Stay: Payer: Medicare PPO | Admitting: Internal Medicine

## 2016-02-05 DIAGNOSIS — S82841D Displaced bimalleolar fracture of right lower leg, subsequent encounter for closed fracture with routine healing: Secondary | ICD-10-CM

## 2016-02-05 DIAGNOSIS — J9612 Chronic respiratory failure with hypercapnia: Secondary | ICD-10-CM

## 2016-02-05 DIAGNOSIS — G9340 Encephalopathy, unspecified: Secondary | ICD-10-CM | POA: Diagnosis not present

## 2016-02-05 DIAGNOSIS — I10 Essential (primary) hypertension: Secondary | ICD-10-CM | POA: Diagnosis not present

## 2016-02-09 DIAGNOSIS — R0602 Shortness of breath: Secondary | ICD-10-CM | POA: Diagnosis not present

## 2016-02-09 DIAGNOSIS — R062 Wheezing: Secondary | ICD-10-CM | POA: Diagnosis not present

## 2016-02-09 DIAGNOSIS — J449 Chronic obstructive pulmonary disease, unspecified: Secondary | ICD-10-CM | POA: Diagnosis not present

## 2016-02-09 DIAGNOSIS — G4733 Obstructive sleep apnea (adult) (pediatric): Secondary | ICD-10-CM | POA: Diagnosis not present

## 2016-02-09 NOTE — Progress Notes (Signed)
Patient ID: [name], female   DOB: 1941-09-18, 75 y.o.   MRN: [id number]     This is a discharge note  Level care skilled.  Facility [facility].  Chief complaint- Discharge note.  History of present illness.  Patient is a pleasant 75 year old female with a history of COPD status post ankle fracture repair February 28.  She was discharged to skilled nursing on March 2 are presented to the emergency department on the next day with hypoxia and confusion.  She was adsmitted for COPD exasperation acute on chronic hypoxic respiratory failure.  Initially she required a Ventimask   Chest x-ray was negative for any pulmonary embolism there was some suspicion she may have some aspiration here.  She was placed initially on Zosyn and transitioned to oral Augmentin on March 4.  Patient continues on chronic oxygen.  In regards to COPD exasperation this resolved she has completed  Augmentin as well as albuterol bronchodilators Brovana and steroid taper. This has stabilize  Patient did have originally some encephalopathy apparently this resolved she does have some baseline what appears to be mild dementia.  Patient also was found to have pyuria with Trichomonas present she was treated with Flagyl 2 g 1 dose.  Regards to her right ankle fracture this is followed by orthopedics-she is thought to be stable  Patient was found to have a prolonged QT on EKG recommendation to follow this. Will warrant follow-up by primary care provider  Currently she has no complaints she is sitting in her wheelchair comfortably appears to be stable. She is looking forward to going home she has good family support in fact one of her daughters is a Marine scientist in this facility  Previous medical history.  Acute on chronic respiratory failure a positive.  COPD.  Suspected aspiration pneumonia.  Acute encephalopathy resolved.  Mildly elevated troponin thought due to demand  ischemia.  Hypertension.  Hyperlipidemia.  Memory difficulty.  Obstructive sleep apnea.  Status post right ankle fracture repair on February 28.  Pyuria with Trichomonas that was treated.  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  Also involving C3-C3 7.  Allergic rhinitis.  History of colon surgery rather right hemo-colectomy for high-grade adenomas of the right colon in 2013.  History of mild dementia.    Surgical history.  ORIF ankle fracture on the right in March 2013.  Abdominal hysterectomy in 1976.  Oophorectomy in 1976.  History of appendectomy.  Colon resection October 2013.  ORIF of ankle fracture again on 01/13/2016.  Social history.  She quit smoking about 9 years ago this includes cigarettes-she has a 60-pack-year smoking history.   Has never used smokeless tobacco or significant alcohol or illicit drug use.  Family history significant for stomach cancer father.  Brain cancer of her mother as well as breast cancer.  Medications    Albuterol nebulizers every 4 hours when necessary.  Vicodin 5-3 25 mg every 4 hours when necessary.  Albuterol inhaler 2 puffs in the lungs every 8 hours when necessary.  Brovana twice a day.  Aspirin enteric-coated 325 mg daily.  Lipitor 10 mg by mouth daily.  Ducolax suppository when necessary.  Calcium with vitamin D 500-200 L grams-daily.  Aricept 10 mg daily at bedtime.  Prozac 40 mg daily.  Imodium 2 mg daily.  Claritin 10 mg daily.  Multivitamin daily.  Prilosec 40 mg daily.  MiraLAX daily.  Verapamil 240 mg controlled release daily at bedtime.    Review of systems.  In general does not complaining of fever chills says she feels r well.  Skin does not complain of rashes or itching.  Eyes does not complain of visual changes.  Ear nose mouth and throat does not complaining of any sore throat or nasal discharge.  Cardiac does not complain of chest pain  or palpitation  s respiratory significant history as noted above but says her breathing is improved does not really complain of shortness of breath today or significantly increased cough.  GI does not complain of nausea vomiting diarrhea constipation or abdominal pain.  GU does not complaining of dysuria.  Muscle skeletal is status post right ankle fracture with repair but pain at this point appears to be relatively well controlled.  Neurologic is not complaining of dizziness headache or numbness.   psych is not complaining of depression or anxiety appears to have a history of depression she is on Prozac-this has not been an issue during her stay here  Physical exam.  Temperature 97.6 pulse 84 respirations 18 blood pressure 128/69  In general this is a pleasant elderly female in no distress sitting comfortably in her wheelchair.  Her skin is warm and dry.  Eyes she has prescription lenses visual acuity appears to be grossly intact.  Oropharynx clear mucous membranes moist.  Chest is clear to auscultation with somewhat shallow air entry there is no labored breathing. Or overt wheezing  Heart is regular irregular rate and rhythm without murmur gallop or rub she has minimalt lower extremity edema on the left right lower extremity is currently in a walking boot  Abdomen soft obese soft nontender positive bowel sounds.  Muscle skeletal is able to move all  Extremities x  4 with limitation of the right lower extremity secondary to the ankle fracture  Neurologic is grossly intact speech is clear no lateralizing findings.  Psych appears grossly alert and oriented pleasant and appropriate  Labs.  01/26/2016.  WBC 8.3 hemoglobin 10.3 platelets 332.  Sodium 141 potassium 3.9 BUN 17 creatinine 0.84 CO2 level was 35.    01/21/2016.  Sodium 140 potassium 3.6 BUN 22 creatinine 0.82.  WBC 9.4 hemoglobin 11.2 platelets 359.  Assessment plan.  History of respiratory failure this  appears to have stabilized s She has completed a prednisone taper as well as Augmentin continues on Brovana as well as pro-air and CPAP at night  Continues on albuterol nebulizers as needed  #2 history of acute encephalopathy-she does have some history of mild dementia but appears to be largely oriented pleasant -- encephalopathy appears to have resolved suspect this was due to the acute episode as noted above.  History of right ankle fracture with repair on February 28 this when he followed by orthopedics at this point pain appears to be controlled she does continue on Norco as needed.--She is followed closely by orthopedics and per most recent office note thought to be stable  #4-history of hypertension-she continues on verapamil blood pressure 128/69 appears to be satisfactory  #5-history of depression this appears stable on Prozac.  #6 dementia as noted above this is quite mild she is on Aricept.  #7 history of anemia thought to be chronic disease this apparently has been stable most recent hemoglobin show stability at 10.3 Will update this  History hyperlipidemia she continues on a statin-liver function tests on March 3 were within normal limits except albumin of 3.3 since her stay here at suspect we'll be fairly short was not aggressive pursuing a lipid panel-.  #9-and  history of allergic rhinitis she continues on Claritin this appears to be stable.   Again patient would fit from continued PT and OT-also will update CBC and BMP prior to discharge to ensure stability-she does have strong family support at home.  [id number] note greater than 30 minutes spent on this discharge summary-greater than 50% time spent coordinating plan of care for numerous diagnoses

## 2016-02-10 DIAGNOSIS — J449 Chronic obstructive pulmonary disease, unspecified: Secondary | ICD-10-CM | POA: Diagnosis not present

## 2016-02-11 ENCOUNTER — Telehealth: Payer: Self-pay | Admitting: Orthopedic Surgery

## 2016-02-11 DIAGNOSIS — F418 Other specified anxiety disorders: Secondary | ICD-10-CM | POA: Diagnosis not present

## 2016-02-11 DIAGNOSIS — S82841D Displaced bimalleolar fracture of right lower leg, subsequent encounter for closed fracture with routine healing: Secondary | ICD-10-CM | POA: Diagnosis not present

## 2016-02-11 DIAGNOSIS — I10 Essential (primary) hypertension: Secondary | ICD-10-CM | POA: Diagnosis not present

## 2016-02-11 DIAGNOSIS — J449 Chronic obstructive pulmonary disease, unspecified: Secondary | ICD-10-CM | POA: Diagnosis not present

## 2016-02-11 NOTE — Telephone Encounter (Signed)
Please call Meda Coffee, RN with [facility] regarding orders for this patient.  [facility]'s phone number is [phone]

## 2016-02-11 NOTE — Telephone Encounter (Signed)
Patient is staying with daughter in [place], [place].  [name] needs verbal for nursing education as well as evaluate and treat.  Verbal given.

## 2016-02-12 DIAGNOSIS — F418 Other specified anxiety disorders: Secondary | ICD-10-CM | POA: Diagnosis not present

## 2016-02-12 DIAGNOSIS — J449 Chronic obstructive pulmonary disease, unspecified: Secondary | ICD-10-CM | POA: Diagnosis not present

## 2016-02-12 DIAGNOSIS — S82841D Displaced bimalleolar fracture of right lower leg, subsequent encounter for closed fracture with routine healing: Secondary | ICD-10-CM | POA: Diagnosis not present

## 2016-02-12 DIAGNOSIS — I10 Essential (primary) hypertension: Secondary | ICD-10-CM | POA: Diagnosis not present

## 2016-02-13 DIAGNOSIS — I10 Essential (primary) hypertension: Secondary | ICD-10-CM | POA: Diagnosis not present

## 2016-02-13 DIAGNOSIS — S82841D Displaced bimalleolar fracture of right lower leg, subsequent encounter for closed fracture with routine healing: Secondary | ICD-10-CM | POA: Diagnosis not present

## 2016-02-13 DIAGNOSIS — J449 Chronic obstructive pulmonary disease, unspecified: Secondary | ICD-10-CM | POA: Diagnosis not present

## 2016-02-13 DIAGNOSIS — F418 Other specified anxiety disorders: Secondary | ICD-10-CM | POA: Diagnosis not present

## 2016-02-17 DIAGNOSIS — S82841D Displaced bimalleolar fracture of right lower leg, subsequent encounter for closed fracture with routine healing: Secondary | ICD-10-CM | POA: Diagnosis not present

## 2016-02-17 DIAGNOSIS — I10 Essential (primary) hypertension: Secondary | ICD-10-CM | POA: Diagnosis not present

## 2016-02-17 DIAGNOSIS — F418 Other specified anxiety disorders: Secondary | ICD-10-CM | POA: Diagnosis not present

## 2016-02-17 DIAGNOSIS — J449 Chronic obstructive pulmonary disease, unspecified: Secondary | ICD-10-CM | POA: Diagnosis not present

## 2016-02-18 DIAGNOSIS — J449 Chronic obstructive pulmonary disease, unspecified: Secondary | ICD-10-CM | POA: Diagnosis not present

## 2016-02-18 DIAGNOSIS — I10 Essential (primary) hypertension: Secondary | ICD-10-CM | POA: Diagnosis not present

## 2016-02-18 DIAGNOSIS — F418 Other specified anxiety disorders: Secondary | ICD-10-CM | POA: Diagnosis not present

## 2016-02-18 DIAGNOSIS — S82841D Displaced bimalleolar fracture of right lower leg, subsequent encounter for closed fracture with routine healing: Secondary | ICD-10-CM | POA: Diagnosis not present

## 2016-02-19 ENCOUNTER — Ambulatory Visit: Payer: Medicare PPO | Admitting: Family Medicine

## 2016-02-19 DIAGNOSIS — M84471D Pathological fracture, right ankle, subsequent encounter for fracture with routine healing: Secondary | ICD-10-CM | POA: Diagnosis not present

## 2016-02-19 DIAGNOSIS — I10 Essential (primary) hypertension: Secondary | ICD-10-CM | POA: Diagnosis not present

## 2016-02-19 DIAGNOSIS — J449 Chronic obstructive pulmonary disease, unspecified: Secondary | ICD-10-CM | POA: Diagnosis not present

## 2016-02-19 DIAGNOSIS — F418 Other specified anxiety disorders: Secondary | ICD-10-CM | POA: Diagnosis not present

## 2016-02-19 DIAGNOSIS — S82841D Displaced bimalleolar fracture of right lower leg, subsequent encounter for closed fracture with routine healing: Secondary | ICD-10-CM | POA: Diagnosis not present

## 2016-02-20 DIAGNOSIS — I10 Essential (primary) hypertension: Secondary | ICD-10-CM | POA: Diagnosis not present

## 2016-02-20 DIAGNOSIS — F418 Other specified anxiety disorders: Secondary | ICD-10-CM | POA: Diagnosis not present

## 2016-02-20 DIAGNOSIS — S82841D Displaced bimalleolar fracture of right lower leg, subsequent encounter for closed fracture with routine healing: Secondary | ICD-10-CM | POA: Diagnosis not present

## 2016-02-20 DIAGNOSIS — J449 Chronic obstructive pulmonary disease, unspecified: Secondary | ICD-10-CM | POA: Diagnosis not present

## 2016-02-23 DIAGNOSIS — S82841D Displaced bimalleolar fracture of right lower leg, subsequent encounter for closed fracture with routine healing: Secondary | ICD-10-CM | POA: Diagnosis not present

## 2016-02-23 DIAGNOSIS — I10 Essential (primary) hypertension: Secondary | ICD-10-CM | POA: Diagnosis not present

## 2016-02-23 DIAGNOSIS — F418 Other specified anxiety disorders: Secondary | ICD-10-CM | POA: Diagnosis not present

## 2016-02-23 DIAGNOSIS — J449 Chronic obstructive pulmonary disease, unspecified: Secondary | ICD-10-CM | POA: Diagnosis not present

## 2016-02-24 DIAGNOSIS — F418 Other specified anxiety disorders: Secondary | ICD-10-CM | POA: Diagnosis not present

## 2016-02-24 DIAGNOSIS — S82841D Displaced bimalleolar fracture of right lower leg, subsequent encounter for closed fracture with routine healing: Secondary | ICD-10-CM | POA: Diagnosis not present

## 2016-02-24 DIAGNOSIS — J449 Chronic obstructive pulmonary disease, unspecified: Secondary | ICD-10-CM | POA: Diagnosis not present

## 2016-02-24 DIAGNOSIS — I10 Essential (primary) hypertension: Secondary | ICD-10-CM | POA: Diagnosis not present

## 2016-02-25 DIAGNOSIS — J449 Chronic obstructive pulmonary disease, unspecified: Secondary | ICD-10-CM | POA: Diagnosis not present

## 2016-02-25 DIAGNOSIS — S82841D Displaced bimalleolar fracture of right lower leg, subsequent encounter for closed fracture with routine healing: Secondary | ICD-10-CM | POA: Diagnosis not present

## 2016-02-25 DIAGNOSIS — I10 Essential (primary) hypertension: Secondary | ICD-10-CM | POA: Diagnosis not present

## 2016-02-25 DIAGNOSIS — F418 Other specified anxiety disorders: Secondary | ICD-10-CM | POA: Diagnosis not present

## 2016-02-26 DIAGNOSIS — F418 Other specified anxiety disorders: Secondary | ICD-10-CM | POA: Diagnosis not present

## 2016-02-26 DIAGNOSIS — J449 Chronic obstructive pulmonary disease, unspecified: Secondary | ICD-10-CM | POA: Diagnosis not present

## 2016-02-26 DIAGNOSIS — S82841D Displaced bimalleolar fracture of right lower leg, subsequent encounter for closed fracture with routine healing: Secondary | ICD-10-CM | POA: Diagnosis not present

## 2016-02-26 DIAGNOSIS — I10 Essential (primary) hypertension: Secondary | ICD-10-CM | POA: Diagnosis not present

## 2016-03-02 ENCOUNTER — Encounter: Payer: Self-pay | Admitting: Orthopedic Surgery

## 2016-03-02 ENCOUNTER — Ambulatory Visit: Payer: Medicare PPO | Admitting: Orthopedic Surgery

## 2016-03-02 ENCOUNTER — Ambulatory Visit (INDEPENDENT_AMBULATORY_CARE_PROVIDER_SITE_OTHER): Payer: Medicare PPO

## 2016-03-02 VITALS — BP 124/66 | Ht 73.0 in | Wt 236.0 lb

## 2016-03-02 DIAGNOSIS — S82891A Other fracture of right lower leg, initial encounter for closed fracture: Secondary | ICD-10-CM | POA: Diagnosis not present

## 2016-03-02 DIAGNOSIS — S82841D Displaced bimalleolar fracture of right lower leg, subsequent encounter for closed fracture with routine healing: Secondary | ICD-10-CM

## 2016-03-02 NOTE — Progress Notes (Signed)
Chief Complaint  Patient presents with  . Follow-up    FOLLOW UP + XRAY RIGHT ANKLE FRACTURE, DOS 01/13/16   Dorsal wound new already scabbed over , ? If from brace doesn't look like that's the cause.  X-rays show fracture is stable hardware is intact mortise is in good position  Continue Cam Walker no weightbearing follow-up in 4-6 weeks for x-ray out of plaster

## 2016-03-02 NOTE — Patient Instructions (Signed)
No weight bearing  Continue wearing boot  Follow up 1 month

## 2016-03-04 ENCOUNTER — Encounter: Payer: Self-pay | Admitting: Family Medicine

## 2016-03-04 ENCOUNTER — Ambulatory Visit (INDEPENDENT_AMBULATORY_CARE_PROVIDER_SITE_OTHER): Payer: Medicare PPO | Admitting: Family Medicine

## 2016-03-04 VITALS — BP 120/80 | HR 80 | Resp 18

## 2016-03-04 DIAGNOSIS — E785 Hyperlipidemia, unspecified: Secondary | ICD-10-CM | POA: Diagnosis not present

## 2016-03-04 DIAGNOSIS — D649 Anemia, unspecified: Secondary | ICD-10-CM | POA: Diagnosis not present

## 2016-03-04 DIAGNOSIS — Z09 Encounter for follow-up examination after completed treatment for conditions other than malignant neoplasm: Secondary | ICD-10-CM

## 2016-03-04 DIAGNOSIS — I10 Essential (primary) hypertension: Secondary | ICD-10-CM

## 2016-03-04 DIAGNOSIS — E559 Vitamin D deficiency, unspecified: Secondary | ICD-10-CM

## 2016-03-04 DIAGNOSIS — D539 Nutritional anemia, unspecified: Secondary | ICD-10-CM | POA: Diagnosis not present

## 2016-03-04 DIAGNOSIS — R296 Repeated falls: Secondary | ICD-10-CM

## 2016-03-04 LAB — IRON AND TIBC
%SAT: 22 % (ref 11–50)
Iron: 60 ug/dL (ref 45–160)
TIBC: 268 ug/dL (ref 250–450)
UIBC: 208 ug/dL (ref 125–400)

## 2016-03-04 LAB — CBC
HCT: 35.8 % (ref 35.0–45.0)
Hemoglobin: 10.7 g/dL — ABNORMAL LOW (ref 11.7–15.5)
MCH: 24.8 pg — AB (ref 27.0–33.0)
MCHC: 29.9 g/dL — ABNORMAL LOW (ref 32.0–36.0)
MCV: 82.9 fL (ref 80.0–100.0)
MPV: 9.8 fL (ref 7.5–12.5)
PLATELETS: 322 10*3/uL (ref 140–400)
RBC: 4.32 MIL/uL (ref 3.80–5.10)
RDW: 15.9 % — AB (ref 11.0–15.0)
WBC: 7.4 10*3/uL (ref 3.8–10.8)

## 2016-03-04 LAB — BASIC METABOLIC PANEL
BUN: 14 mg/dL (ref 7–25)
CO2: 29 mmol/L (ref 20–31)
Calcium: 9 mg/dL (ref 8.6–10.4)
Chloride: 103 mmol/L (ref 98–110)
Creat: 0.82 mg/dL (ref 0.60–0.93)
Glucose, Bld: 87 mg/dL (ref 65–99)
Potassium: 3.8 mmol/L (ref 3.5–5.3)
SODIUM: 143 mmol/L (ref 135–146)

## 2016-03-04 LAB — FERRITIN: FERRITIN: 74 ng/mL (ref 20–288)

## 2016-03-04 LAB — VITAMIN B12: Vitamin B-12: 711 pg/mL (ref 200–1100)

## 2016-03-04 LAB — FOLATE: Folate: >24 ng/mL (ref >5.4)

## 2016-03-04 NOTE — Progress Notes (Signed)
   Subjective:    Patient ID: [name], female    DOB: 02-24-1941, 75 y.o.   MRN: [id number]  HPI Patient in for follow up of recent hospitalization and SNF care. She had a right ankle fracture,01/13/2016, this was repaired,  and this was complicated by post op COPD flare and possible aspiration pneumonia Has been back with her daughters for approx 3 weeks, concern regarding poor appetite, otherwise doing well. The real issue with her diet is that she wants "barbecue" and family thinks this is too salty She is in good spirtis and has no real complaints Still partial weight bearing only Discharge summary, and laboratory and radiology data are reviewed, and any questions or concerns about recent hospitalization are discussed. Specific issues requiring follow up are specifically addressed.    Review of Systems See HPI Denies recent fever or chills. Denies sinus pressure, nasal congestion, ear pain or sore throat. Denies chest congestion, productive cough or wheezing. Denies chest pains, palpitations and leg swelling Denies abdominal pain, nausea, vomiting,diarrhea or constipation.   Denies dysuria, frequency, hesitancy or incontinence. Marked  limitation in mobility.Due to healing fracture right ankle Denies headaches, seizures, numbness, or tingling. Denies depression, anxiety or insomnia. Denies skin break down or rash.        Objective:   Physical Exam  BP 120/80 mmHg  Pulse 80  Resp 18  SpO2 92% Patient alert and oriented and in no cardiopulmonary distress.  HEENT: No facial asymmetry, EOMI,   oropharynx pink and moist.  Neck supple no JVD, no mass.  Chest: Clear to auscultation bilaterally.Decreased air entry throughout  CVS: S1, S2 no murmurs, no S3.Regular rate.  ABD: Soft non tender.   Ext: No edema  MS: decreased  ROM spine, shoulders, hips and knees.Left leg in boot  Skin: Intact, no ulcerations or rash noted.  Psych: Good eye contact, normal  affect. Memory intact not anxious or depressed appearing.  CNS: CN 2-12 intact, power,  normal throughout.no focal deficits noted.       Assessment & Plan:  Hospital discharge follow-up Stable and improved from recent hospitalization for acute respiratory failure . following surgery for right ankle fracture and aspiration pneumonia. On all prior home meds except remeron, which will not be resumed  Pt encouraged to increase food inake for improved healing and strength. Briefly discussed possibility of power wheelchaair but honme unable to accomodate. Currently able to self propel in a standard wheelchair   Essential hypertension Controlled, no change in medication   DEPRESSION Controlled, no change in medication   Falls frequently Home safety reviewed with patient and her daughter, she remaions at high fall risk and recurrent fracture

## 2016-03-04 NOTE — Assessment & Plan Note (Signed)
Controlled, no change in medication  

## 2016-03-04 NOTE — Patient Instructions (Addendum)
Annual physical exam in 4 month, call if you ned me sooner  Please use wheelchir to get around when no one else is present  Half serving size of barbecue with vegetables alot  And some potato should be good  Thankful; much improved  Labs today cBc, anemia panel, chem 7 and Vit D   Fall Prevention in the Home  Falls can cause injuries. They can happen to people of all ages. There are many things you can do to make your home safe and to help prevent falls.  WHAT CAN I DO ON THE OUTSIDE OF MY HOME?  Regularly fix the edges of walkways and driveways and fix any cracks.  Remove anything that might make you trip as you walk through a door, such as a raised step or threshold.  Trim any bushes or trees on the path to your home.  Use bright outdoor lighting.  Clear any walking paths of anything that might make someone trip, such as rocks or tools.  Regularly check to see if handrails are loose or broken. Make sure that both sides of any steps have handrails.  Any raised decks and porches should have guardrails on the edges.  Have any leaves, snow, or ice cleared regularly.  Use sand or salt on walking paths during winter.  Clean up any spills in your garage right away. This includes oil or grease spills. WHAT CAN I DO IN THE BATHROOM?   Use night lights.  Install grab bars by the toilet and in the tub and shower. Do not use towel bars as grab bars.  Use non-skid mats or decals in the tub or shower.  If you need to sit down in the shower, use a plastic, non-slip stool.  Keep the floor dry. Clean up any water that spills on the floor as soon as it happens.  Remove soap buildup in the tub or shower regularly.  Attach bath mats securely with double-sided non-slip rug tape.  Do not have throw rugs and other things on the floor that can make you trip. WHAT CAN I DO IN THE BEDROOM?  Use night lights.  Make sure that you have a light by your bed that is easy to reach.  Do not  use any sheets or blankets that are too big for your bed. They should not hang down onto the floor.  Have a firm chair that has side arms. You can use this for support while you get dressed.  Do not have throw rugs and other things on the floor that can make you trip. WHAT CAN I DO IN THE KITCHEN?  Clean up any spills right away.  Avoid walking on wet floors.  Keep items that you use a lot in easy-to-reach places.  If you need to reach something above you, use a strong step stool that has a grab bar.  Keep electrical cords out of the way.  Do not use floor polish or wax that makes floors slippery. If you must use wax, use non-skid floor wax.  Do not have throw rugs and other things on the floor that can make you trip. WHAT CAN I DO WITH MY STAIRS?  Do not leave any items on the stairs.  Make sure that there are handrails on both sides of the stairs and use them. Fix handrails that are broken or loose. Make sure that handrails are as long as the stairways.  Check any carpeting to make sure that it is firmly attached to  the stairs. Fix any carpet that is loose or worn.  Avoid having throw rugs at the top or bottom of the stairs. If you do have throw rugs, attach them to the floor with carpet tape.  Make sure that you have a light switch at the top of the stairs and the bottom of the stairs. If you do not have them, ask someone to add them for you. WHAT ELSE CAN I DO TO HELP PREVENT FALLS?  Wear shoes that:  Do not have high heels.  Have rubber bottoms.  Are comfortable and fit you well.  Are closed at the toe. Do not wear sandals.  If you use a stepladder:  Make sure that it is fully opened. Do not climb a closed stepladder.  Make sure that both sides of the stepladder are locked into place.  Ask someone to hold it for you, if possible.  Clearly mark and make sure that you can see:  Any grab bars or handrails.  First and last steps.  Where the edge of each step  is.  Use tools that help you move around (mobility aids) if they are needed. These include:  Canes.  Walkers.  Scooters.  Crutches.  Turn on the lights when you go into a dark area. Replace any light bulbs as soon as they burn out.  Set up your furniture so you have a clear path. Avoid moving your furniture around.  If any of your floors are uneven, fix them.  If there are any pets around you, be aware of where they are.  Review your medicines with your doctor. Some medicines can make you feel dizzy. This can increase your chance of falling. Ask your doctor what other things that you can do to help prevent falls.   This information is not intended to replace advice given to you by your health care provider. Make sure you discuss any questions you have with your health care provider.   Document Released: 08/28/2009 Document Revised: 03/18/2015 Document Reviewed: 12/06/2014 Elsevier Interactive Patient Education [employer].

## 2016-03-04 NOTE — Assessment & Plan Note (Signed)
Home safety reviewed with patient and her daughter, she remaions at high fall risk and recurrent fracture

## 2016-03-04 NOTE — Assessment & Plan Note (Signed)
Stable and improved from recent hospitalization for acute respiratory failure . following surgery for right ankle fracture and aspiration pneumonia. On all prior home meds except remeron, which will not be resumed  Pt encouraged to increase food inake for improved healing and strength. Briefly discussed possibility of power wheelchaair but honme unable to accomodate. Currently able to self propel in a standard wheelchair

## 2016-03-05 LAB — VITAMIN D 25 HYDROXY (VIT D DEFICIENCY, FRACTURES): Vit D, 25-Hydroxy: 28 ng/mL — ABNORMAL LOW (ref 30–100)

## 2016-03-12 DIAGNOSIS — J449 Chronic obstructive pulmonary disease, unspecified: Secondary | ICD-10-CM | POA: Diagnosis not present

## 2016-03-19 DIAGNOSIS — J449 Chronic obstructive pulmonary disease, unspecified: Secondary | ICD-10-CM | POA: Diagnosis not present

## 2016-03-20 DIAGNOSIS — M84471D Pathological fracture, right ankle, subsequent encounter for fracture with routine healing: Secondary | ICD-10-CM | POA: Diagnosis not present

## 2016-03-24 ENCOUNTER — Other Ambulatory Visit: Payer: Self-pay

## 2016-03-24 MED ORDER — VITAMIN D (ERGOCALCIFEROL) 1.25 MG (50000 UNIT) PO CAPS: 50000.0000 [IU] | ORAL_CAPSULE | ORAL | Status: DC

## 2016-04-05 ENCOUNTER — Encounter: Payer: Self-pay | Admitting: Orthopedic Surgery

## 2016-04-05 ENCOUNTER — Ambulatory Visit (INDEPENDENT_AMBULATORY_CARE_PROVIDER_SITE_OTHER): Payer: Medicare PPO

## 2016-04-05 ENCOUNTER — Ambulatory Visit (INDEPENDENT_AMBULATORY_CARE_PROVIDER_SITE_OTHER): Payer: Medicare PPO | Admitting: Orthopedic Surgery

## 2016-04-05 VITALS — BP 133/81 | Ht 73.0 in | Wt 236.0 lb

## 2016-04-05 DIAGNOSIS — S82841D Displaced bimalleolar fracture of right lower leg, subsequent encounter for closed fracture with routine healing: Secondary | ICD-10-CM

## 2016-04-05 DIAGNOSIS — S82891A Other fracture of right lower leg, initial encounter for closed fracture: Secondary | ICD-10-CM

## 2016-04-05 NOTE — Progress Notes (Signed)
Chief Complaint  Patient presents with  . Follow-up    Right ankle fracture, DOS 01/13/16   BP 133/81 mmHg  Ht 6\' 1"  (1.854 m)  Wt 236 lb (107.049 kg)  BMI 31.14 kg/m2  Ankle bones are intact  Ankle motion is good  Ankle x-ray show fracture healing  Patient can remove boot weightbearing as tolerated follow-up as needed

## 2016-04-07 ENCOUNTER — Telehealth: Payer: Self-pay | Admitting: Family Medicine

## 2016-04-07 NOTE — Telephone Encounter (Signed)
Patients daughter called and stated that [name] is now able to bear weight on her foot and ankle and she needs orders to restart PT, please advise?

## 2016-04-09 NOTE — Telephone Encounter (Signed)
Message forwarded to orthopedics

## 2016-04-09 NOTE — Telephone Encounter (Signed)
I would think ortho would direct this since she is just now recovering from a fracture, pls check into this furhter

## 2016-04-11 DIAGNOSIS — J449 Chronic obstructive pulmonary disease, unspecified: Secondary | ICD-10-CM | POA: Diagnosis not present

## 2016-04-13 ENCOUNTER — Encounter: Payer: Self-pay | Admitting: *Deleted

## 2016-04-13 NOTE — Telephone Encounter (Signed)
This encounter was created in error - please disregard.

## 2016-04-14 ENCOUNTER — Other Ambulatory Visit: Payer: Self-pay | Admitting: *Deleted

## 2016-04-14 ENCOUNTER — Encounter: Payer: Self-pay | Admitting: *Deleted

## 2016-04-14 DIAGNOSIS — S82891A Other fracture of right lower leg, initial encounter for closed fracture: Secondary | ICD-10-CM

## 2016-04-14 NOTE — Telephone Encounter (Signed)
Order sent to [facility] for PT, patient's daughter aware.

## 2016-04-14 NOTE — Telephone Encounter (Signed)
This encounter was created in error - please disregard.

## 2016-04-19 DIAGNOSIS — Z9981 Dependence on supplemental oxygen: Secondary | ICD-10-CM | POA: Diagnosis not present

## 2016-04-19 DIAGNOSIS — Z6831 Body mass index (BMI) 31.0-31.9, adult: Secondary | ICD-10-CM | POA: Diagnosis not present

## 2016-04-19 DIAGNOSIS — J449 Chronic obstructive pulmonary disease, unspecified: Secondary | ICD-10-CM | POA: Diagnosis not present

## 2016-04-19 DIAGNOSIS — Z8701 Personal history of pneumonia (recurrent): Secondary | ICD-10-CM | POA: Diagnosis not present

## 2016-04-19 DIAGNOSIS — S82891D Other fracture of right lower leg, subsequent encounter for closed fracture with routine healing: Secondary | ICD-10-CM | POA: Diagnosis not present

## 2016-04-19 DIAGNOSIS — E669 Obesity, unspecified: Secondary | ICD-10-CM | POA: Diagnosis not present

## 2016-04-19 DIAGNOSIS — Z7982 Long term (current) use of aspirin: Secondary | ICD-10-CM | POA: Diagnosis not present

## 2016-04-20 DIAGNOSIS — M84471D Pathological fracture, right ankle, subsequent encounter for fracture with routine healing: Secondary | ICD-10-CM | POA: Diagnosis not present

## 2016-04-21 ENCOUNTER — Encounter: Payer: Self-pay | Admitting: Neurology

## 2016-04-21 ENCOUNTER — Ambulatory Visit (INDEPENDENT_AMBULATORY_CARE_PROVIDER_SITE_OTHER): Payer: Medicare PPO | Admitting: Neurology

## 2016-04-21 VITALS — BP 138/86 | HR 80 | Ht 73.0 in | Wt 233.5 lb

## 2016-04-21 DIAGNOSIS — R269 Unspecified abnormalities of gait and mobility: Secondary | ICD-10-CM | POA: Diagnosis not present

## 2016-04-21 DIAGNOSIS — R413 Other amnesia: Secondary | ICD-10-CM | POA: Diagnosis not present

## 2016-04-21 NOTE — Progress Notes (Signed)
Reason for visit: Memory disturbance  [name] is an 75 y.o. female  History of present illness:  [name] is a 75 year old right-handed black female with a history of a memory disturbance and a significant gait disturbance. The patient fell several weeks ago, she fractured the right ankle which required surgery. The patient has recovered from this, she is now getting into physical therapy in the home environment. The patient lives at home, her family lives close to her, she does not do any cooking with exception of cooking oatmeal. She uses a walker for ambulation, but her balance is quite severely impacted. She is able to dress himself and bathe herself, her family sets up her medications in a pill dispenser which she will take. She reports that her memory problems have been stable over time. She does not operate a motor vehicle. She returns to this office for an evaluation. She remains on Aricept, tolerating the medication well.  Past Medical History  Diagnosis Date  . Allergic rhinitis   . Anemia   . Anxiety   . Depression   . GERD (gastroesophageal reflux disease)   . Hypertension   . Low back pain   . Arthritis     abnormal gait   . OSA on CPAP   . Degenerative disc disease, lumbar     L5 nerve impingement   . 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 Arm fracture, left   . Onychomycosis   . Ankle fracture, right   . Hyperglycemia   . Mediastinal lymphadenopathy 1/9    resolving   . COPD (chronic obstructive pulmonary disease) (HCC)     chronic CO2 retention, decreased DLCO   . Cystic acne     adult   . Sleep apnea     stop bang score 6  . History of colon surgery     right hemicolectomy for high-grade adenomas in right colon 2013  . Abnormality of gait 06/06/2014  . Memory difficulty 09/20/2014    mild dementia  . DNR (do not resuscitate)     Past Surgical History  Procedure Laterality Date  . Abdominal hysterectomy  1976   secondary to bleeding   . Oophorectomy  1976  . Epidural steroids    . Orif ankle fracture  01/18/2012    Procedure: OPEN REDUCTION INTERNAL FIXATION (ORIF) ANKLE FRACTURE;  Surgeon: Arther Abbott, MD;  Location: [facility];  Service: Orthopedics;  Laterality: Left;  . Colonoscopy  07/20/2012    Dr. Gala Romney: multiple colonic polyps with large polyps on right side, s/p saline-assisted debulking piecemeal polypectomy and ablation. Not all removed. Path with tubulovillous adenomas, high grade dysplasia.   [name] Appendectomy    . Colon resection  08/18/2012    Procedure: HAND ASSISTED LAPAROSCOPIC COLON RESECTION;  Surgeon: Jamesetta So, MD;  Location: [facility];  Service: General;  Laterality: N/A;  . Colonoscopy N/A 03/09/2013    [id number] polyps-tubular adenomas. S/p right hemicolectomy. next tcs 02/2018  . Esophagogastroduodenoscopy (egd) with esophageal dilation N/A 02/06/2014    Procedure: ESOPHAGOGASTRODUODENOSCOPY (EGD) WITH ESOPHAGEAL DILATION;  Surgeon: Daneil Dolin, MD;  Location: [facility];  Service: Endoscopy;  Laterality: N/A;  9:30  . Orif ankle fracture Right 01/13/2016    Procedure: OPEN REDUCTION INTERNAL FIXATION (ORIF) ANKLE FRACTURE;  Surgeon: Carole Civil, MD;  Location: [facility];  Service: Orthopedics;  Laterality: Right;    Family History  Problem Relation Age of Onset  .  Stomach cancer Father   . Cancer Father   . Cancer Mother     brain   . Breast cancer Mother   . Arthritis      Social history:  reports that she quit smoking about 9 years ago. Her smoking use included Cigarettes. She has a 60 pack-year smoking history. She has never used smokeless tobacco. She reports that she does not drink alcohol or use illicit drugs.    Allergies  Allergen Reactions  . Fenofibrate Nausea And Vomiting  . Pravastatin Sodium Nausea And Vomiting  . Sulfonamide Derivatives Other (See Comments)    unknown  . Ciprofloxacin Rash    Medications:  Prior to Admission medications     Medication Sig Start Date End Date Taking? Authorizing Provider  acetaminophen (TYLENOL) 500 MG tablet Take 500 mg by mouth 2 (two) times daily.   Yes Historical Provider, MD  albuterol ([name]) (2.5 MG/3ML) 0.083% nebulizer solution Take 3 mLs (2.5 mg total) by nebulization every 4 (four) hours as needed for wheezing or shortness of breath. 01/18/16  Yes Ripudeep Krystal Eaton, MD  Albuterol Sulfate (PROAIR RESPICLICK) 123XX123 (90 BASE) MCG/ACT AEPB Inhale 2 puffs into the lungs every 8 (eight) hours as needed. 11/11/15  Yes [name], MD  alendronate (FOSAMAX) 70 MG tablet Take 70 mg by mouth once a week. Take with a full glass of water on an empty stomach.   Yes Historical Provider, MD  arformoterol (BROVANA) 15 MCG/2ML NEBU Take 2 mLs (15 mcg total) by nebulization 2 (two) times daily. 04/25/12  Yes [name], MD  aspirin EC 325 MG EC tablet Take 1 tablet (325 mg total) by mouth daily with breakfast. 01/15/16  Yes Carole Civil, MD  atorvastatin (LIPITOR) 10 MG tablet Take 1 tablet (10 mg total) by mouth daily. 12/15/15  Yes [name], MD  calcium-vitamin D (OSCAL WITH D) 500-200 MG-UNIT per tablet Take 1 tablet by mouth daily.    Yes Historical Provider, MD  donepezil (ARICEPT) 10 MG tablet Take 1 tablet (10 mg total) by mouth at bedtime. 12/15/15  Yes [name], MD  ENSURE (ENSURE) Take one by mouth with meals   Yes Historical Provider, MD  FLUoxetine (PROZAC) 40 MG capsule Take 1 capsule (40 mg total) by mouth daily. 12/15/15  Yes [name], MD  loratadine (CLARITIN) 10 MG tablet Take 10 mg by mouth daily.   Yes Historical Provider, MD  Misc. Devices [facility]) MISC Use as needed 12/21/15  Yes Orpah Greek, MD  Multiple Vitamins-Minerals (CEROVITE SENIOR) TABS Take 1 tablet by mouth daily.    Yes Historical Provider, MD  omeprazole (PRILOSEC) 40 MG capsule Take 1 capsule (40 mg total) by mouth daily. 12/15/15  Yes [name], MD  verapamil  (CALAN-SR) 240 MG CR tablet Take 1 tablet (240 mg total) by mouth at bedtime. 12/15/15  Yes [name], MD  Vitamin D, Ergocalciferol, (DRISDOL) 50000 units CAPS capsule Take 1 capsule (50,000 Units total) by mouth every 7 (seven) days. 03/24/16  Yes [name], MD    ROS:  Out of a complete 14 system review of symptoms, the patient complains only of the following symptoms, and all other reviewed systems are negative.  Gait disturbance Shortness of breath  Blood pressure 138/86, pulse 80, height 6\' 1"  (1.854 m), weight 233 lb 8 oz (105.915 kg).  Physical Exam  General: The patient is alert and cooperative at the time of the examination.  The patient is on nasal cannula oxygen.  Skin: No significant peripheral edema is noted.   Neurologic Exam  Mental status: The patient is alert and oriented x 3 at the time of the examination. The patient has apparent normal recent and remote memory, with an apparently normal attention span and concentration ability. Mini-Mental Status Examination done today shows a total score 27/30.   Cranial nerves: Facial symmetry is present. Speech is normal, no aphasia or dysarthria is noted. Extraocular movements are full. Visual fields are full.  Motor: The patient has good strength in all 4 extremities.  Sensory examination: Soft touch sensation is symmetric on the face, arms, and legs.  Coordination: The patient has good finger-nose-finger and heel-to-shin bilaterally.  Gait and station: The patient has a very unsteady gait. The patient has a tendency to fall backwards or to the side, requires assistance with walking or using a walker. The patient has positive Romberg. Tandem gait was not attempted.  Reflexes: Deep tendon reflexes are symmetric.   Assessment/Plan:  1. Severe gait disorder  2. Memory disorder  The family believes that she has been relatively stable with her walking and her memory. She is on Aricept which will be  continued, she will follow-up in about 8 or 9 months. At some point, Namenda can be added to the regimen.  Jill Alexanders MD 04/21/2016 6:14 PM  [facility] [address] [place], [place] [address]  Phone [phone] Fax [phone]

## 2016-04-22 DIAGNOSIS — E669 Obesity, unspecified: Secondary | ICD-10-CM | POA: Diagnosis not present

## 2016-04-22 DIAGNOSIS — Z7982 Long term (current) use of aspirin: Secondary | ICD-10-CM | POA: Diagnosis not present

## 2016-04-22 DIAGNOSIS — Z8701 Personal history of pneumonia (recurrent): Secondary | ICD-10-CM | POA: Diagnosis not present

## 2016-04-22 DIAGNOSIS — Z9981 Dependence on supplemental oxygen: Secondary | ICD-10-CM | POA: Diagnosis not present

## 2016-04-22 DIAGNOSIS — S82891D Other fracture of right lower leg, subsequent encounter for closed fracture with routine healing: Secondary | ICD-10-CM | POA: Diagnosis not present

## 2016-04-22 DIAGNOSIS — Z6831 Body mass index (BMI) 31.0-31.9, adult: Secondary | ICD-10-CM | POA: Diagnosis not present

## 2016-04-26 DIAGNOSIS — Z9981 Dependence on supplemental oxygen: Secondary | ICD-10-CM | POA: Diagnosis not present

## 2016-04-26 DIAGNOSIS — Z8701 Personal history of pneumonia (recurrent): Secondary | ICD-10-CM | POA: Diagnosis not present

## 2016-04-26 DIAGNOSIS — Z7982 Long term (current) use of aspirin: Secondary | ICD-10-CM | POA: Diagnosis not present

## 2016-04-26 DIAGNOSIS — E669 Obesity, unspecified: Secondary | ICD-10-CM | POA: Diagnosis not present

## 2016-04-26 DIAGNOSIS — S82891D Other fracture of right lower leg, subsequent encounter for closed fracture with routine healing: Secondary | ICD-10-CM | POA: Diagnosis not present

## 2016-04-26 DIAGNOSIS — Z6831 Body mass index (BMI) 31.0-31.9, adult: Secondary | ICD-10-CM | POA: Diagnosis not present

## 2016-04-27 ENCOUNTER — Encounter: Payer: Medicare PPO | Admitting: Family Medicine

## 2016-04-27 DIAGNOSIS — S82891D Other fracture of right lower leg, subsequent encounter for closed fracture with routine healing: Secondary | ICD-10-CM | POA: Diagnosis not present

## 2016-04-27 DIAGNOSIS — Z8701 Personal history of pneumonia (recurrent): Secondary | ICD-10-CM | POA: Diagnosis not present

## 2016-04-27 DIAGNOSIS — E669 Obesity, unspecified: Secondary | ICD-10-CM | POA: Diagnosis not present

## 2016-04-27 DIAGNOSIS — Z6831 Body mass index (BMI) 31.0-31.9, adult: Secondary | ICD-10-CM | POA: Diagnosis not present

## 2016-04-28 ENCOUNTER — Telehealth: Payer: Self-pay | Admitting: Family Medicine

## 2016-04-28 NOTE — Telephone Encounter (Signed)
[name] has questions about [name] medications please advise?

## 2016-04-29 ENCOUNTER — Other Ambulatory Visit: Payer: Self-pay

## 2016-04-29 DIAGNOSIS — R413 Other amnesia: Secondary | ICD-10-CM

## 2016-04-29 MED ORDER — OMEPRAZOLE 40 MG PO CPDR: 40.0000 mg | DELAYED_RELEASE_CAPSULE | Freq: Every day | ORAL | Status: DC

## 2016-04-29 MED ORDER — VERAPAMIL HCL ER 240 MG PO TBCR: 240.0000 mg | EXTENDED_RELEASE_TABLET | Freq: Every day | ORAL | Status: DC

## 2016-04-29 MED ORDER — FLUOXETINE HCL 40 MG PO CAPS: 40.0000 mg | ORAL_CAPSULE | Freq: Every day | ORAL | Status: DC

## 2016-04-29 MED ORDER — ATORVASTATIN CALCIUM 10 MG PO TABS: 10.0000 mg | ORAL_TABLET | Freq: Every day | ORAL | Status: DC

## 2016-04-29 MED ORDER — DONEPEZIL HCL 10 MG PO TABS: 10.0000 mg | ORAL_TABLET | Freq: Every day | ORAL | Status: DC

## 2016-04-29 NOTE — Telephone Encounter (Signed)
Spoke with daughter and refilled requested medications.

## 2016-04-30 DIAGNOSIS — Z6831 Body mass index (BMI) 31.0-31.9, adult: Secondary | ICD-10-CM | POA: Diagnosis not present

## 2016-04-30 DIAGNOSIS — Z8701 Personal history of pneumonia (recurrent): Secondary | ICD-10-CM | POA: Diagnosis not present

## 2016-04-30 DIAGNOSIS — E669 Obesity, unspecified: Secondary | ICD-10-CM | POA: Diagnosis not present

## 2016-04-30 DIAGNOSIS — Z7982 Long term (current) use of aspirin: Secondary | ICD-10-CM | POA: Diagnosis not present

## 2016-04-30 DIAGNOSIS — S82891D Other fracture of right lower leg, subsequent encounter for closed fracture with routine healing: Secondary | ICD-10-CM | POA: Diagnosis not present

## 2016-04-30 DIAGNOSIS — Z9981 Dependence on supplemental oxygen: Secondary | ICD-10-CM | POA: Diagnosis not present

## 2016-05-03 DIAGNOSIS — Z7982 Long term (current) use of aspirin: Secondary | ICD-10-CM | POA: Diagnosis not present

## 2016-05-03 DIAGNOSIS — Z9981 Dependence on supplemental oxygen: Secondary | ICD-10-CM | POA: Diagnosis not present

## 2016-05-03 DIAGNOSIS — S82891D Other fracture of right lower leg, subsequent encounter for closed fracture with routine healing: Secondary | ICD-10-CM | POA: Diagnosis not present

## 2016-05-03 DIAGNOSIS — Z6831 Body mass index (BMI) 31.0-31.9, adult: Secondary | ICD-10-CM | POA: Diagnosis not present

## 2016-05-03 DIAGNOSIS — E669 Obesity, unspecified: Secondary | ICD-10-CM | POA: Diagnosis not present

## 2016-05-03 DIAGNOSIS — Z8701 Personal history of pneumonia (recurrent): Secondary | ICD-10-CM | POA: Diagnosis not present

## 2016-05-04 DIAGNOSIS — E669 Obesity, unspecified: Secondary | ICD-10-CM | POA: Diagnosis not present

## 2016-05-04 DIAGNOSIS — Z9981 Dependence on supplemental oxygen: Secondary | ICD-10-CM | POA: Diagnosis not present

## 2016-05-04 DIAGNOSIS — S82891D Other fracture of right lower leg, subsequent encounter for closed fracture with routine healing: Secondary | ICD-10-CM | POA: Diagnosis not present

## 2016-05-04 DIAGNOSIS — Z8701 Personal history of pneumonia (recurrent): Secondary | ICD-10-CM | POA: Diagnosis not present

## 2016-05-04 DIAGNOSIS — Z6831 Body mass index (BMI) 31.0-31.9, adult: Secondary | ICD-10-CM | POA: Diagnosis not present

## 2016-05-04 DIAGNOSIS — Z7982 Long term (current) use of aspirin: Secondary | ICD-10-CM | POA: Diagnosis not present

## 2016-05-12 DIAGNOSIS — J449 Chronic obstructive pulmonary disease, unspecified: Secondary | ICD-10-CM | POA: Diagnosis not present

## 2016-05-19 DIAGNOSIS — J449 Chronic obstructive pulmonary disease, unspecified: Secondary | ICD-10-CM | POA: Diagnosis not present

## 2016-05-20 DIAGNOSIS — M84471D Pathological fracture, right ankle, subsequent encounter for fracture with routine healing: Secondary | ICD-10-CM | POA: Diagnosis not present

## 2016-05-24 DIAGNOSIS — J449 Chronic obstructive pulmonary disease, unspecified: Secondary | ICD-10-CM | POA: Diagnosis not present

## 2016-06-11 DIAGNOSIS — J449 Chronic obstructive pulmonary disease, unspecified: Secondary | ICD-10-CM | POA: Diagnosis not present

## 2016-06-14 ENCOUNTER — Other Ambulatory Visit: Payer: Self-pay | Admitting: Family Medicine

## 2016-06-14 DIAGNOSIS — Z1231 Encounter for screening mammogram for malignant neoplasm of breast: Secondary | ICD-10-CM

## 2016-06-17 ENCOUNTER — Ambulatory Visit (HOSPITAL_COMMUNITY)
Admission: RE | Admit: 2016-06-17 | Discharge: 2016-06-17 | Disposition: A | Discharge status: Home / Self Care | Payer: Medicare PPO | Source: Ambulatory Visit | Attending: Family Medicine | Admitting: Family Medicine

## 2016-06-17 DIAGNOSIS — Z1231 Encounter for screening mammogram for malignant neoplasm of breast: Secondary | ICD-10-CM

## 2016-06-19 DIAGNOSIS — J449 Chronic obstructive pulmonary disease, unspecified: Secondary | ICD-10-CM | POA: Diagnosis not present

## 2016-06-20 DIAGNOSIS — M84471D Pathological fracture, right ankle, subsequent encounter for fracture with routine healing: Secondary | ICD-10-CM | POA: Diagnosis not present

## 2016-07-08 ENCOUNTER — Encounter: Payer: Self-pay | Admitting: Family Medicine

## 2016-07-08 ENCOUNTER — Ambulatory Visit (INDEPENDENT_AMBULATORY_CARE_PROVIDER_SITE_OTHER): Payer: Medicare PPO | Admitting: Family Medicine

## 2016-07-08 VITALS — BP 130/80 | HR 81 | Resp 16 | Ht 73.0 in | Wt 221.1 lb

## 2016-07-08 DIAGNOSIS — D649 Anemia, unspecified: Secondary | ICD-10-CM | POA: Diagnosis not present

## 2016-07-08 DIAGNOSIS — Z1211 Encounter for screening for malignant neoplasm of colon: Secondary | ICD-10-CM

## 2016-07-08 DIAGNOSIS — E785 Hyperlipidemia, unspecified: Secondary | ICD-10-CM | POA: Diagnosis not present

## 2016-07-08 DIAGNOSIS — Z0000 Encounter for general adult medical examination without abnormal findings: Secondary | ICD-10-CM | POA: Diagnosis not present

## 2016-07-08 DIAGNOSIS — Z23 Encounter for immunization: Secondary | ICD-10-CM

## 2016-07-08 DIAGNOSIS — I10 Essential (primary) hypertension: Secondary | ICD-10-CM

## 2016-07-08 DIAGNOSIS — S82843A Displaced bimalleolar fracture of unspecified lower leg, initial encounter for closed fracture: Secondary | ICD-10-CM | POA: Diagnosis not present

## 2016-07-08 LAB — COMPREHENSIVE METABOLIC PANEL
ALT: 11 U/L (ref 6–29)
AST: 16 U/L (ref 10–35)
Albumin: 4 g/dL (ref 3.6–5.1)
Alkaline Phosphatase: 72 U/L (ref 33–130)
BUN: 18 mg/dL (ref 7–25)
CALCIUM: 9.2 mg/dL (ref 8.6–10.4)
CHLORIDE: 104 mmol/L (ref 98–110)
CO2: 30 mmol/L (ref 20–31)
Creat: 0.84 mg/dL (ref 0.60–0.93)
GLUCOSE: 85 mg/dL (ref 65–99)
POTASSIUM: 4 mmol/L (ref 3.5–5.3)
Sodium: 142 mmol/L (ref 135–146)
Total Bilirubin: 0.4 mg/dL (ref 0.2–1.2)
Total Protein: 6.5 g/dL (ref 6.1–8.1)

## 2016-07-08 LAB — CBC
HEMATOCRIT: 38.5 % (ref 35.0–45.0)
HEMOGLOBIN: 11.7 g/dL (ref 11.7–15.5)
MCH: 25.1 pg — AB (ref 27.0–33.0)
MCHC: 30.4 g/dL — AB (ref 32.0–36.0)
MCV: 82.6 fL (ref 80.0–100.0)
MPV: 10.2 fL (ref 7.5–12.5)
Platelets: 267 10*3/uL (ref 140–400)
RBC: 4.66 MIL/uL (ref 3.80–5.10)
RDW: 15.7 % — ABNORMAL HIGH (ref 11.0–15.0)
WBC: 4.6 10*3/uL (ref 3.8–10.8)

## 2016-07-08 LAB — LIPID PANEL
CHOLESTEROL: 162 mg/dL (ref 125–200)
HDL: 83 mg/dL (ref >=46)
LDL CALC: 58 mg/dL (ref <130)
TRIGLYCERIDES: 104 mg/dL (ref <150)
Total CHOL/HDL Ratio: 2 Ratio (ref <=5.0)
VLDL: 21 mg/dL (ref <30)

## 2016-07-08 LAB — POC HEMOCCULT BLD/STL (OFFICE/1-CARD/DIAGNOSTIC): Fecal Occult Blood, POC: NEGATIVE

## 2016-07-08 LAB — TSH: TSH: 0.72 m[IU]/L

## 2016-07-08 NOTE — Assessment & Plan Note (Addendum)
After obtaining informed consent, the vaccine is  administered by LPN.  

## 2016-07-08 NOTE — Assessment & Plan Note (Signed)

## 2016-07-08 NOTE — Patient Instructions (Addendum)
Annual wellness in 3 month, call if you need me before  Flu vaccine today  Labs today     Fall Prevention in the Home  Falls can cause injuries. They can happen to people of all ages. There are many things you can do to make your home safe and to help prevent falls.  WHAT CAN I DO ON THE OUTSIDE OF MY HOME?  Regularly fix the edges of walkways and driveways and fix any cracks.  Remove anything that might make you trip as you walk through a door, such as a raised step or threshold.  Trim any bushes or trees on the path to your home.  Use bright outdoor lighting.  Clear any walking paths of anything that might make someone trip, such as rocks or tools.  Regularly check to see if handrails are loose or broken. Make sure that both sides of any steps have handrails.  Any raised decks and porches should have guardrails on the edges.  Have any leaves, snow, or ice cleared regularly.  Use sand or salt on walking paths during winter.  Clean up any spills in your garage right away. This includes oil or grease spills. WHAT CAN I DO IN THE BATHROOM?   Use night lights.  Install grab bars by the toilet and in the tub and shower. Do not use towel bars as grab bars.  Use non-skid mats or decals in the tub or shower.  If you need to sit down in the shower, use a plastic, non-slip stool.  Keep the floor dry. Clean up any water that spills on the floor as soon as it happens.  Remove soap buildup in the tub or shower regularly.  Attach bath mats securely with double-sided non-slip rug tape.  Do not have throw rugs and other things on the floor that can make you trip. WHAT CAN I DO IN THE BEDROOM?  Use night lights.  Make sure that you have a light by your bed that is easy to reach.  Do not use any sheets or blankets that are too big for your bed. They should not hang down onto the floor.  Have a firm chair that has side arms. You can use this for support while you get  dressed.  Do not have throw rugs and other things on the floor that can make you trip. WHAT CAN I DO IN THE KITCHEN?  Clean up any spills right away.  Avoid walking on wet floors.  Keep items that you use a lot in easy-to-reach places.  If you need to reach something above you, use a strong step stool that has a grab bar.  Keep electrical cords out of the way.  Do not use floor polish or wax that makes floors slippery. If you must use wax, use non-skid floor wax.  Do not have throw rugs and other things on the floor that can make you trip. WHAT CAN I DO WITH MY STAIRS?  Do not leave any items on the stairs.  Make sure that there are handrails on both sides of the stairs and use them. Fix handrails that are broken or loose. Make sure that handrails are as long as the stairways.  Check any carpeting to make sure that it is firmly attached to the stairs. Fix any carpet that is loose or worn.  Avoid having throw rugs at the top or bottom of the stairs. If you do have throw rugs, attach them to the floor with carpet  tape.  Make sure that you have a light switch at the top of the stairs and the bottom of the stairs. If you do not have them, ask someone to add them for you. WHAT ELSE CAN I DO TO HELP PREVENT FALLS?  Wear shoes that:  Do not have high heels.  Have rubber bottoms.  Are comfortable and fit you well.  Are closed at the toe. Do not wear sandals.  If you use a stepladder:  Make sure that it is fully opened. Do not climb a closed stepladder.  Make sure that both sides of the stepladder are locked into place.  Ask someone to hold it for you, if possible.  Clearly mark and make sure that you can see:  Any grab bars or handrails.  First and last steps.  Where the edge of each step is.  Use tools that help you move around (mobility aids) if they are needed. These include:  Canes.  Walkers.  Scooters.  Crutches.  Turn on the lights when you go into a  dark area. Replace any light bulbs as soon as they burn out.  Set up your furniture so you have a clear path. Avoid moving your furniture around.  If any of your floors are uneven, fix them.  If there are any pets around you, be aware of where they are.  Review your medicines with your doctor. Some medicines can make you feel dizzy. This can increase your chance of falling. Ask your doctor what other things that you can do to help prevent falls.   This information is not intended to replace advice given to you by your health care provider. Make sure you discuss any questions you have with your health care provider.   Document Released: 08/28/2009 Document Revised: 03/18/2015 Document Reviewed: 12/06/2014 Elsevier Interactive Patient Education [employer].

## 2016-07-08 NOTE — Progress Notes (Signed)
    [name]     MRN: [id number]      DOB: January 16, 1941  HPI: Patient is in for annual physical exam. No other health concerns are expressed or addressed at the visit. Recent labs, if available are reviewed. Immunization is reviewed , and  updated if needed.   PE: Pleasant  female, alert and oriented x 3, in no cardio-pulmonary distress. Afebrile. HEENT No facial trauma or asymetry. Sinuses non tender.  Extra occullar muscles intact, pupils equally reactive to light. External ears normal, tympanic membranes clear. Oropharynx moist, no exudate. Neck: decreased ROM, no adenopathy,JVD or thyromegaly.No bruits.  Chest: Clear to ascultation bilaterally.No crackles or wheezes. Decreased BS throughout Non tender to palpation  Breast: No asymetry,no masses or lumps. No tenderness. No nipple discharge or inversion. No axillary or supraclavicular adenopathy  Cardiovascular system; Heart sounds normal,  S1 and  S2 ,no S3. systolicNo murmur, or thrill. Apical beat not displaced Peripheral pulses normal.  Abdomen: Soft, non tender, no organomegaly or masses. No bruits. Bowel sounds normal. No guarding, tenderness or rebound.  Rectal:  Normal sphincter tone. No rectal mass. Guaiac negative stool.  GU: Not examined, asymptomatic and is s/p hysterectomy  Musculoskeletal exam: Decreased  ROM of spine, hips , shoulders and knees.  deformity ,swelling and  crepitus noted. No muscle wasting or atrophy.   Neurologic: Cranial nerves 2 to 12 intact. Power, tone ,sensation and reflexes normal throughout. No disturbance in gait. No tremor.  Skin: Intact, no ulceration, erythema , scaling or rash noted. Pigmentation normal throughout  Psych; Normal mood and affect. Judgement and concentration normal   Assessment & Plan:  Annual physical exam Annual exam as documented. Counseling done  re healthy lifestyle involving commitment to 150 minutes exercise per week, heart  healthy diet, and attaining healthy weight.The importance of adequate sleep also discussed. Regular seat belt use and home safety, is also discussed. Changes in health habits are decided on by the patient with goals and time frames  set for achieving them. Immunization and cancer screening needs are specifically addressed at this visit.   Need for prophylactic vaccination and inoculation against influenza After obtaining informed consent, the vaccine is  administered by LPN.

## 2016-07-09 LAB — VITAMIN D 25 HYDROXY (VIT D DEFICIENCY, FRACTURES): Vit D, 25-Hydroxy: 41 ng/mL (ref 30–100)

## 2016-07-12 DIAGNOSIS — J449 Chronic obstructive pulmonary disease, unspecified: Secondary | ICD-10-CM | POA: Diagnosis not present

## 2016-07-19 ENCOUNTER — Emergency Department (HOSPITAL_COMMUNITY): Payer: Medicare PPO

## 2016-07-19 ENCOUNTER — Emergency Department (HOSPITAL_COMMUNITY)
Admission: EM | Admit: 2016-07-19 | Discharge: 2016-07-19 | Disposition: A | Discharge status: Home / Self Care | Payer: Medicare PPO | Attending: Emergency Medicine | Admitting: Emergency Medicine

## 2016-07-19 ENCOUNTER — Other Ambulatory Visit: Payer: Self-pay | Admitting: Family Medicine

## 2016-07-19 ENCOUNTER — Encounter (HOSPITAL_COMMUNITY): Payer: Self-pay | Admitting: Emergency Medicine

## 2016-07-19 DIAGNOSIS — Z7982 Long term (current) use of aspirin: Secondary | ICD-10-CM | POA: Insufficient documentation

## 2016-07-19 DIAGNOSIS — Y929 Unspecified place or not applicable: Secondary | ICD-10-CM | POA: Diagnosis not present

## 2016-07-19 DIAGNOSIS — Z87891 Personal history of nicotine dependence: Secondary | ICD-10-CM | POA: Insufficient documentation

## 2016-07-19 DIAGNOSIS — W19XXXA Unspecified fall, initial encounter: Secondary | ICD-10-CM | POA: Insufficient documentation

## 2016-07-19 DIAGNOSIS — Z79899 Other long term (current) drug therapy: Secondary | ICD-10-CM | POA: Insufficient documentation

## 2016-07-19 DIAGNOSIS — S92334A Nondisplaced fracture of third metatarsal bone, right foot, initial encounter for closed fracture: Secondary | ICD-10-CM | POA: Insufficient documentation

## 2016-07-19 DIAGNOSIS — J441 Chronic obstructive pulmonary disease with (acute) exacerbation: Secondary | ICD-10-CM | POA: Insufficient documentation

## 2016-07-19 DIAGNOSIS — Y999 Unspecified external cause status: Secondary | ICD-10-CM | POA: Insufficient documentation

## 2016-07-19 DIAGNOSIS — S92901K Unspecified fracture of right foot, subsequent encounter for fracture with nonunion: Secondary | ICD-10-CM

## 2016-07-19 DIAGNOSIS — Y939 Activity, unspecified: Secondary | ICD-10-CM | POA: Diagnosis not present

## 2016-07-19 DIAGNOSIS — I10 Essential (primary) hypertension: Secondary | ICD-10-CM | POA: Diagnosis not present

## 2016-07-19 DIAGNOSIS — S99921A Unspecified injury of right foot, initial encounter: Secondary | ICD-10-CM | POA: Diagnosis present

## 2016-07-19 DIAGNOSIS — S92301A Fracture of unspecified metatarsal bone(s), right foot, initial encounter for closed fracture: Secondary | ICD-10-CM

## 2016-07-19 LAB — CBC WITH DIFFERENTIAL/PLATELET
BASOS ABS: 0 10*3/uL (ref 0.0–0.1)
BASOS PCT: 0 %
Eosinophils Absolute: 0.2 10*3/uL (ref 0.0–0.7)
Eosinophils Relative: 3 %
HEMATOCRIT: 36 % (ref 36.0–46.0)
HEMOGLOBIN: 11 g/dL — AB (ref 12.0–15.0)
Lymphocytes Relative: 19 %
Lymphs Abs: 1.4 10*3/uL (ref 0.7–4.0)
MCH: 25.8 pg — ABNORMAL LOW (ref 26.0–34.0)
MCHC: 30.6 g/dL (ref 30.0–36.0)
MCV: 84.5 fL (ref 78.0–100.0)
Monocytes Absolute: 0.5 10*3/uL (ref 0.1–1.0)
Monocytes Relative: 7 %
NEUTROS ABS: 5.1 10*3/uL (ref 1.7–7.7)
NEUTROS PCT: 71 %
Platelets: 195 10*3/uL (ref 150–400)
RBC: 4.26 MIL/uL (ref 3.87–5.11)
RDW: 14.9 % (ref 11.5–15.5)
WBC: 7.2 10*3/uL (ref 4.0–10.5)

## 2016-07-19 LAB — BASIC METABOLIC PANEL
ANION GAP: 6 (ref 5–15)
BUN: 16 mg/dL (ref 6–20)
CALCIUM: 9.2 mg/dL (ref 8.9–10.3)
CHLORIDE: 102 mmol/L (ref 101–111)
CO2: 32 mmol/L (ref 22–32)
Creatinine, Ser: 0.87 mg/dL (ref 0.44–1.00)
GFR calc non Af Amer: >60 mL/min (ref >60)
Glucose, Bld: 94 mg/dL (ref 65–99)
Potassium: 3.8 mmol/L (ref 3.5–5.1)
SODIUM: 140 mmol/L (ref 135–145)

## 2016-07-19 MED ORDER — HYDROCODONE-ACETAMINOPHEN 5-325 MG PO TABS
1.0000 | ORAL_TABLET | Freq: Once | ORAL | Status: AC
  Administered 2016-07-19: 1 via ORAL
  Filled 2016-07-19: qty 1

## 2016-07-19 MED ORDER — HYDROCODONE-ACETAMINOPHEN 5-325 MG PO TABS: 1.0000 | ORAL_TABLET | ORAL | 0 refills | Status: DC | PRN

## 2016-07-19 MED ORDER — BACITRACIN-NEOMYCIN-POLYMYXIN 400-5-5000 EX OINT
TOPICAL_OINTMENT | Freq: Once | CUTANEOUS | Status: AC
  Administered 2016-07-19: 1 via TOPICAL
  Filled 2016-07-19: qty 1

## 2016-07-19 MED ORDER — ONDANSETRON HCL 4 MG PO TABS
4.0000 mg | ORAL_TABLET | Freq: Once | ORAL | Status: AC
Start: 2016-07-19 — End: 2016-07-19
  Administered 2016-07-19: 4 mg via ORAL
  Filled 2016-07-19: qty 1

## 2016-07-19 MED ORDER — DOXYCYCLINE HYCLATE 100 MG PO CAPS: 100.0000 mg | ORAL_CAPSULE | Freq: Two times a day (BID) | ORAL | 0 refills | Status: DC

## 2016-07-19 MED ORDER — DOXYCYCLINE HYCLATE 100 MG PO TABS
100.0000 mg | ORAL_TABLET | Freq: Once | ORAL | Status: AC
  Administered 2016-07-19: 100 mg via ORAL
  Filled 2016-07-19: qty 1

## 2016-07-19 NOTE — ED Triage Notes (Signed)
Reports swelling of right foot since yesterday.  Wound noted to top of foot appearing as a burn.  Pt does not remember hurting her foot, but states she may have dropped oatmeal on it.

## 2016-07-19 NOTE — ED Notes (Signed)
Returned from xray

## 2016-07-19 NOTE — ED Provider Notes (Signed)
[facility] DEPT Provider Note   CSN: [id number] Arrival date & time: 07/19/16  0745     History   Chief Complaint Chief Complaint  Patient presents with  . Foot Injury    HPI [name] is a 75 y.o. female.  Patient is a 75 year old female who presents to the emergency department with a complaint of pain and swelling of the right foot.  The patient and her family gives history that they notice swelling on yesterday. The patient does not recall hurting her foot, but the family says that she has had surgery approximately 4 months ago for a broken ankle and she is still weak and at times she turns or twists it, and has had a few falls. It is also of note that the patient has a wound at the top of the foot on. The patient states she does not recall when she injured it, but it was probably related to spilling hot coffee on it, or dropping hot oatmeal when she was attempting to cook. She has not had fever, chills, nausea, vomiting, or related symptoms. She presents now for evaluation of this issue.   The history is provided by the patient and a relative.  Foot Injury      Past Medical History:  Diagnosis Date  . Abnormality of gait 06/06/2014  . Allergic rhinitis   . Anemia   . Ankle fracture, right   . Anxiety   . Arm fracture, left   . Arthritis    abnormal gait   . COPD (chronic obstructive pulmonary disease) (HCC)    chronic CO2 retention, decreased DLCO   . Cystic acne    adult   . 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 Degenerative disc disease, lumbar    L5 nerve impingement   . Depression   . DNR (do not resuscitate)   . GERD (gastroesophageal reflux disease)   . History of colon surgery    right hemicolectomy for high-grade adenomas in right colon 2013  . Hyperglycemia   . Hypertension   . Low back pain   . Mediastinal lymphadenopathy 1/9   resolving   . Memory difficulty 09/20/2014   mild dementia  . Onychomycosis   . OSA on CPAP    . Sleep apnea    stop bang score 6    Patient Active Problem List   Diagnosis Date Noted  . COPD exacerbation ([place]) 01/16/2016  . Aspiration pneumonia ([place]) 01/16/2016  . Acute respiratory failure with hypoxia ([place]) 01/16/2016  . Hypercapnic respiratory failure, chronic ([place]) 01/16/2016  . Bimalleolar ankle fracture 01/13/2016  . Need for prophylactic vaccination and inoculation against influenza 08/13/2015  . Annual physical exam 03/13/2015  . Memory difficulty 09/20/2014  . Abnormality of gait 06/06/2014  . Cervical neck pain with evidence of disc disease 05/20/2014  . Multiple adenomatous polyps 02/15/2013  . Spinal stenosis 09/29/2011  . Falls frequently 09/01/2011  . ALLERGIC RHINITIS, SEASONAL 04/10/2010  . Hyperlipidemia LDL goal <100 01/25/2007  . ANEMIA-NOS 10/05/2006  . DEPRESSION 10/05/2006  . Obstructive sleep apnea 10/05/2006  . Essential hypertension 10/05/2006  . GERD 10/05/2006  . Generalized osteoarthritis of multiple sites 10/05/2006    Past Surgical History:  Procedure Laterality Date  . ABDOMINAL HYSTERECTOMY  1976   secondary to bleeding   . APPENDECTOMY    . COLON RESECTION  08/18/2012   Procedure: HAND ASSISTED LAPAROSCOPIC COLON RESECTION;  Surgeon: Jamesetta So, MD;  Location: [facility];  Service: General;  Laterality: N/A;  . COLONOSCOPY  07/20/2012   Dr. Gala Romney: multiple colonic polyps with large polyps on right side, s/p saline-assisted debulking piecemeal polypectomy and ablation. Not all removed. Path with tubulovillous adenomas, high grade dysplasia.   . COLONOSCOPY N/A 03/09/2013   [id number] polyps-tubular adenomas. S/p right hemicolectomy. next tcs 02/2018  . epidural steroids    . ESOPHAGOGASTRODUODENOSCOPY (EGD) WITH ESOPHAGEAL DILATION N/A 02/06/2014   Procedure: ESOPHAGOGASTRODUODENOSCOPY (EGD) WITH ESOPHAGEAL DILATION;  Surgeon: Daneil Dolin, MD;  Location: [facility];  Service: Endoscopy;  Laterality: N/A;  9:30  . OOPHORECTOMY  1976   . ORIF ANKLE FRACTURE  01/18/2012   Procedure: OPEN REDUCTION INTERNAL FIXATION (ORIF) ANKLE FRACTURE;  Surgeon: Arther Abbott, MD;  Location: [facility];  Service: Orthopedics;  Laterality: Left;  . ORIF ANKLE FRACTURE Right 01/13/2016   Procedure: OPEN REDUCTION INTERNAL FIXATION (ORIF) ANKLE FRACTURE;  Surgeon: Carole Civil, MD;  Location: [facility];  Service: Orthopedics;  Laterality: Right;    OB History    Gravida Para Term Preterm AB Living   5 4 4   1      SAB TAB Ectopic Multiple Live Births   1               Home Medications    Prior to Admission medications   Medication Sig Start Date End Date Taking? Authorizing Provider  acetaminophen (TYLENOL) 500 MG tablet Take 500 mg by mouth 2 (two) times daily.    Historical Provider, MD  albuterol ([name]) (2.5 MG/3ML) 0.083% nebulizer solution Take 3 mLs (2.5 mg total) by nebulization every 4 (four) hours as needed for wheezing or shortness of breath. 01/18/16   Ripudeep Krystal Eaton, MD  Albuterol Sulfate (PROAIR RESPICLICK) [id number] (90 BASE) MCG/ACT AEPB Inhale 2 puffs into the lungs every 8 (eight) hours as needed. 11/11/15   [name], MD  alendronate (FOSAMAX) 70 MG tablet Take 70 mg by mouth once a week. Take with a full glass of water on an empty stomach.    Historical Provider, MD  arformoterol (BROVANA) 15 MCG/2ML NEBU Take 2 mLs (15 mcg total) by nebulization 2 (two) times daily. 04/25/12   [name], MD  atorvastatin (LIPITOR) 10 MG tablet Take 1 tablet (10 mg total) by mouth daily. 04/29/16   [name], MD  calcium-vitamin D (OSCAL WITH D) 500-200 MG-UNIT per tablet Take 1 tablet by mouth daily.     Historical Provider, MD  donepezil (ARICEPT) 10 MG tablet Take 1 tablet (10 mg total) by mouth at bedtime. 04/29/16   [name], MD  ENSURE (ENSURE) Take one by mouth with meals    Historical Provider, MD  FLUoxetine (PROZAC) 40 MG capsule Take 1 capsule (40 mg total) by mouth daily. 04/29/16   [name], MD  loratadine (CLARITIN) 10 MG tablet Take 10 mg by mouth daily.    Historical Provider, MD  Misc. Devices [facility]) MISC Use as needed 12/21/15   Orpah Greek, MD  Multiple Vitamins-Minerals (CEROVITE SENIOR) TABS Take 1 tablet by mouth daily.     Historical Provider, MD  omeprazole (PRILOSEC) 40 MG capsule Take 1 capsule (40 mg total) by mouth daily. 04/29/16   [name], MD  verapamil (CALAN-SR) 240 MG CR tablet Take 1 tablet (240 mg total) by mouth at bedtime. 04/29/16   [name], MD  Vitamin D, Ergocalciferol, (DRISDOL) 50000 units CAPS capsule Take 1 capsule (50,000 Units total) by  mouth every 7 (seven) days. 03/24/16   [name], MD    Family History Family History  Problem Relation Age of Onset  . Stomach cancer Father   . Cancer Father   . Cancer Mother     brain   . Breast cancer Mother   . Arthritis      Social History Social History  Substance Use Topics  . Smoking status: Former Smoker    Packs/day: 1.50    Years: 40.00    Types: Cigarettes    Quit date: 06/21/2006  . Smokeless tobacco: Never Used  . Alcohol use No     Allergies   Fenofibrate; Pravastatin sodium; Sulfonamide derivatives; and Ciprofloxacin   Review of Systems Review of Systems  Constitutional: Negative for activity change.       All ROS Neg except as noted in HPI  HENT: Negative for nosebleeds.   Eyes: Negative for photophobia and discharge.  Respiratory: Positive for shortness of breath. Negative for cough and wheezing.   Cardiovascular: Negative for chest pain and palpitations.  Gastrointestinal: Negative for abdominal pain and blood in stool.  Genitourinary: Negative for dysuria, frequency and hematuria.  Musculoskeletal: Positive for arthralgias. Negative for back pain and neck pain.  Skin: Positive for wound.  Neurological: Negative for dizziness, seizures and speech difficulty.  Psychiatric/Behavioral: Negative for confusion and  hallucinations.     Physical Exam Updated Vital Signs BP 113/61 (BP Location: Right Arm)   Pulse 84   Temp 97.8 F (36.6 C) (Oral)   Resp 20   Ht 6\' 1"  (1.854 m)   Wt 99.8 kg   SpO2 99%   BMI 29.03 kg/m   Physical Exam  Constitutional: She is oriented to person, place, and time. She appears well-developed and well-nourished.  Non-toxic appearance.  HENT:  Head: Normocephalic.  Right Ear: Tympanic membrane and external ear normal.  Left Ear: Tympanic membrane and external ear normal.  Eyes: EOM and lids are normal. Pupils are equal, round, and reactive to light.  Neck: Normal range of motion. Neck supple. Carotid bruit is not present.  Cardiovascular: Normal rate, regular rhythm, normal heart sounds, intact distal pulses and normal pulses.   Pulmonary/Chest: Breath sounds normal. Tachypnea noted. No respiratory distress.  Home oxygen in place. Tachypnea, but baseline per pt and family.   Abdominal: Soft. Bowel sounds are normal. There is no tenderness. There is no guarding.  Musculoskeletal: Normal range of motion.  There is swelling and tenderness of the right foot. Mod increase redness noted of the dorsum of the foot. There is a wound present at the dorsum of the foot. No drainage or red streaking. Swelling extends to the right ankle. No swelling of the lower leg/calf. No lesions between the toes. No puncture wound of the plantar surface.  Lymphadenopathy:       Head (right side): No submandibular adenopathy present.       Head (left side): No submandibular adenopathy present.    She has no cervical adenopathy.  Neurological: She is alert and oriented to person, place, and time. She has normal strength. No cranial nerve deficit or sensory deficit.  Skin: Skin is warm and dry.  Psychiatric: She has a normal mood and affect. Her speech is normal.  Nursing note and vitals reviewed.    ED Treatments / Results  Labs (all labs ordered are listed, but only abnormal results are  displayed) Labs Reviewed  CBC WITH DIFFERENTIAL/PLATELET  BASIC METABOLIC PANEL    EKG  EKG Interpretation None       Radiology No results found.  Procedures Procedures (including critical care time)  Medications Ordered in ED Medications - No data to display   Initial Impression / Assessment and Plan / ED Course  I have reviewed the triage vital signs and the nursing notes.  Pertinent labs & imaging results that were available during my care of the patient were reviewed by me and considered in my medical decision making (see chart for details).  Clinical Course    *I have reviewed nursing notes, vital signs, and all appropriate lab and imaging results for this patient.**  Final Clinical Impressions(s) / ED Diagnoses Vital signs wnl. Cbc wnl. Xray of the right foot reveals fracture of the 3rd metatarsal . Pt fitted with post op shoe. Wound bandaged. Pt has Cam boot at home. Rx for doxycycline given. Pt will follow up with Dr Aline Brochure concerning the fracture. Rx for norco given for pain.   Final diagnoses:  None    New Prescriptions New Prescriptions   No medications on file     Lily Kocher, PA-C 07/19/16 [id number]    Noemi Chapel, MD 07/19/16 1002

## 2016-07-19 NOTE — Discharge Instructions (Signed)
You have a fracture of the bone behind the third toe right foot. You have a wound to the top of your foot. Please cleanse the wound with soap and water, and apply a nonstick bandage daily until healed. Use doxycycline 2 times daily with food. Please keep your right foot elevated above your waist to improve swelling and pain. Use your cam boot, or your shoe when walking until seen by Dr. Aline Brochure. Use Tylenol for mild pain, use Norco for more severe pain. Norco may cause drowsiness, please use this medicine with caution.

## 2016-07-20 DIAGNOSIS — J449 Chronic obstructive pulmonary disease, unspecified: Secondary | ICD-10-CM | POA: Diagnosis not present

## 2016-07-21 ENCOUNTER — Ambulatory Visit (INDEPENDENT_AMBULATORY_CARE_PROVIDER_SITE_OTHER): Payer: Medicare PPO | Admitting: Orthopedic Surgery

## 2016-07-21 ENCOUNTER — Encounter: Payer: Self-pay | Admitting: Orthopedic Surgery

## 2016-07-21 VITALS — BP 105/58 | HR 81 | Ht 72.0 in | Wt 221.0 lb

## 2016-07-21 DIAGNOSIS — S92301A Fracture of unspecified metatarsal bone(s), right foot, initial encounter for closed fracture: Secondary | ICD-10-CM | POA: Diagnosis not present

## 2016-07-21 DIAGNOSIS — M84471D Pathological fracture, right ankle, subsequent encounter for fracture with routine healing: Secondary | ICD-10-CM | POA: Diagnosis not present

## 2016-07-21 NOTE — Patient Instructions (Signed)
Finish antibiotics Keep wound clean  Wear shoe most comfortable

## 2016-07-21 NOTE — Progress Notes (Signed)
Chief Complaint  Patient presents with  . Foot Injury    right foot injury   HPI DOI: 07/17/16  Review of Systems  Constitutional: Negative for chills, fever and weight loss.  Respiratory: Positive for shortness of breath.   Cardiovascular: Negative for chest pain.  Neurological: Negative for tingling.    Past Medical History:  Diagnosis Date  . Abnormality of gait 06/06/2014  . Allergic rhinitis   . Anemia   . Ankle fracture, right   . Anxiety   . Arm fracture, left   . Arthritis    abnormal gait   . COPD (chronic obstructive pulmonary disease) (HCC)    chronic CO2 retention, decreased DLCO   . Cystic acne    adult   . 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 Degenerative disc disease, lumbar    L5 nerve impingement   . Depression   . DNR (do not resuscitate)   . GERD (gastroesophageal reflux disease)   . History of colon surgery    right hemicolectomy for high-grade adenomas in right colon 2013  . Hyperglycemia   . Hypertension   . Low back pain   . Mediastinal lymphadenopathy 1/9   resolving   . Memory difficulty 09/20/2014   mild dementia  . Onychomycosis   . OSA on CPAP   . Sleep apnea    stop bang score 6    Past Surgical History:  Procedure Laterality Date  . ABDOMINAL HYSTERECTOMY  1976   secondary to bleeding   . APPENDECTOMY    . COLON RESECTION  08/18/2012   Procedure: HAND ASSISTED LAPAROSCOPIC COLON RESECTION;  Surgeon: Jamesetta So, MD;  Location: [facility];  Service: General;  Laterality: N/A;  . COLONOSCOPY  07/20/2012   Dr. Gala Romney: multiple colonic polyps with large polyps on right side, s/p saline-assisted debulking piecemeal polypectomy and ablation. Not all removed. Path with tubulovillous adenomas, high grade dysplasia.   . COLONOSCOPY N/A 03/09/2013   [id number] polyps-tubular adenomas. S/p right hemicolectomy. next tcs 02/2018  . epidural steroids    . ESOPHAGOGASTRODUODENOSCOPY (EGD) WITH ESOPHAGEAL DILATION N/A 02/06/2014   Procedure: ESOPHAGOGASTRODUODENOSCOPY (EGD) WITH ESOPHAGEAL DILATION;  Surgeon: Daneil Dolin, MD;  Location[facility];  Service: Endoscopy;  Laterality: N/A;  9:30  . OOPHORECTOMY  1976  . ORIF ANKLE FRACTURE  01/18/2012   Procedure: OPEN REDUCTION INTERNAL FIXATION (ORIF) ANKLE FRACTURE;  Surgeon: Arther Abbott, MD;  Location: [facility];  Service: Orthopedics;  Laterality: Left;  . ORIF ANKLE FRACTURE Right 01/13/2016   Procedure: OPEN REDUCTION INTERNAL FIXATION (ORIF) ANKLE FRACTURE;  Surgeon: Carole Civil, MD;  Location: [facility];  Service: Orthopedics;  Laterality: Right;   Family History  Problem Relation Age of Onset  . Stomach cancer Father   . Cancer Father   . Cancer Mother     brain   . Breast cancer Mother   . Arthritis     Social History  Substance Use Topics  . Smoking status: Former Smoker    Packs/day: 1.50    Years: 40.00    Types: Cigarettes    Quit date: 06/21/2006  . Smokeless tobacco: Never Used  . Alcohol use No    Current Outpatient Prescriptions:  .  acetaminophen (TYLENOL) 500 MG tablet, Take 500 mg by mouth 2 (two) times daily., Disp: , Rfl:  .  albuterol ([name]) (2.5 MG/3ML) 0.083% nebulizer solution, Take 3 mLs (2.5 mg total) by nebulization every 4 (four) hours as needed  for wheezing or shortness of breath. (Patient not taking: Reported on 07/19/2016), Disp: 75 mL, Rfl: 12 .  Albuterol Sulfate (PROAIR RESPICLICK) 332 (90 BASE) MCG/ACT AEPB, Inhale 2 puffs into the lungs every 8 (eight) hours as needed., Disp: 1 each, Rfl: 11 .  alendronate (FOSAMAX) 70 MG tablet, Take 70 mg by mouth once a week. Take with a full glass of water on an empty stomach., Disp: , Rfl:  .  arformoterol (BROVANA) 15 MCG/2ML NEBU, Take 2 mLs (15 mcg total) by nebulization 2 (two) times daily., Disp: 360 mL, Rfl: 1 .  aspirin EC 81 MG tablet, Take 81 mg by mouth daily., Disp: , Rfl:  .  atorvastatin (LIPITOR) 10 MG tablet, Take 1 tablet (10 mg total) by mouth daily., Disp:  90 tablet, Rfl: 1 .  calcium-vitamin D (OSCAL WITH D) 500-200 MG-UNIT per tablet, Take 1 tablet by mouth daily. , Disp: , Rfl:  .  donepezil (ARICEPT) 10 MG tablet, Take 1 tablet (10 mg total) by mouth at bedtime., Disp: 90 tablet, Rfl: 1 .  doxycycline (VIBRAMYCIN) 100 MG capsule, Take 1 capsule (100 mg total) by mouth 2 (two) times daily., Disp: 14 capsule, Rfl: 0 .  ENSURE (ENSURE), Take one by mouth with meals, Disp: , Rfl:  .  FLUoxetine (PROZAC) 40 MG capsule, Take 1 capsule (40 mg total) by mouth daily., Disp: 90 capsule, Rfl: 1 .  HYDROcodone-acetaminophen (NORCO/VICODIN) 5-325 MG tablet, Take 1 tablet by mouth every 4 (four) hours as needed., Disp: 20 tablet, Rfl: 0 .  loratadine (CLARITIN) 10 MG tablet, Take 10 mg by mouth daily., Disp: , Rfl:  .  Multiple Vitamins-Minerals (CEROVITE SENIOR) TABS, Take 1 tablet by mouth daily. , Disp: , Rfl:  .  omeprazole (PRILOSEC) 40 MG capsule, Take 1 capsule (40 mg total) by mouth daily., Disp: 90 capsule, Rfl: 1 .  verapamil (CALAN-SR) 240 MG CR tablet, Take 1 tablet (240 mg total) by mouth at bedtime., Disp: 90 tablet, Rfl: 1 .  Vitamin D, Ergocalciferol, (DRISDOL) 50000 units CAPS capsule, Take 1 capsule (50,000 Units total) by mouth every 7 (seven) days., Disp: 12 capsule, Rfl: 1  BP (!) 105/58   Pulse 81   Ht 6' (1.829 m)   Wt 221 lb (100.2 kg)   BMI 29.97 kg/m   Physical Exam  Constitutional: She is oriented to person, place, and time. She appears well-developed and well-nourished. No distress.  Cardiovascular: Normal rate and intact distal pulses.   Neurological: She is alert and oriented to person, place, and time. She has normal reflexes. She exhibits normal muscle tone. Coordination normal.  Skin: Skin is warm and dry. No rash noted. She is not diaphoretic. No erythema. No pallor.  Psychiatric: She has a normal mood and affect. Her behavior is normal. Judgment and thought content normal.    Ortho Exam   Right and left upper  extremity alignment is normal no range of motion restrictions no contracture subluxation atrophy or tremor  Right foot the dorsal area there is a small skin abrasion looks like it was a blister. She is on tetracycline twice a day for that. There is no range of motion deficits stability issues strength and muscle tone problems. There is mild edema in the foot and ankle which is chronic. She has altered sensation from diabetes but can feel pressure and sharp touch. She is ambulatory primarily with a wheelchair but does get up to walk and use walker as needed   ASSESSMENT: My  personal interpretation of the images:  Non displaced fracture 3rd met    Ellicott  Pendleton, MD 07/21/2016 9:14 AM

## 2016-07-28 DIAGNOSIS — J449 Chronic obstructive pulmonary disease, unspecified: Secondary | ICD-10-CM | POA: Diagnosis not present

## 2016-08-12 DIAGNOSIS — J449 Chronic obstructive pulmonary disease, unspecified: Secondary | ICD-10-CM | POA: Diagnosis not present

## 2016-08-19 DIAGNOSIS — J449 Chronic obstructive pulmonary disease, unspecified: Secondary | ICD-10-CM | POA: Diagnosis not present

## 2016-08-20 DIAGNOSIS — M84471D Pathological fracture, right ankle, subsequent encounter for fracture with routine healing: Secondary | ICD-10-CM | POA: Diagnosis not present

## 2016-09-01 ENCOUNTER — Ambulatory Visit: Payer: Medicare PPO | Admitting: Orthopedic Surgery

## 2016-09-07 ENCOUNTER — Ambulatory Visit (INDEPENDENT_AMBULATORY_CARE_PROVIDER_SITE_OTHER): Payer: Medicare PPO | Admitting: Orthopedic Surgery

## 2016-09-07 ENCOUNTER — Ambulatory Visit: Payer: Medicare PPO

## 2016-09-07 DIAGNOSIS — S92901D Unspecified fracture of right foot, subsequent encounter for fracture with routine healing: Secondary | ICD-10-CM

## 2016-09-11 DIAGNOSIS — J449 Chronic obstructive pulmonary disease, unspecified: Secondary | ICD-10-CM | POA: Diagnosis not present

## 2016-09-13 ENCOUNTER — Ambulatory Visit (INDEPENDENT_AMBULATORY_CARE_PROVIDER_SITE_OTHER): Payer: Medicare PPO | Admitting: Orthopedic Surgery

## 2016-09-13 ENCOUNTER — Ambulatory Visit (INDEPENDENT_AMBULATORY_CARE_PROVIDER_SITE_OTHER): Payer: Medicare PPO

## 2016-09-13 ENCOUNTER — Encounter: Payer: Self-pay | Admitting: Orthopedic Surgery

## 2016-09-13 DIAGNOSIS — S92811D Other fracture of right foot, subsequent encounter for fracture with routine healing: Secondary | ICD-10-CM

## 2016-09-13 DIAGNOSIS — S92901D Unspecified fracture of right foot, subsequent encounter for fracture with routine healing: Secondary | ICD-10-CM

## 2016-09-13 NOTE — Progress Notes (Signed)
Patient ID: [name], female   DOB: 11-Jan-1941, 75 y.o.   MRN: [id number]  Fracture care follow-up  Chief Complaint  Patient presents with  . Follow-up    RIGHT FOOT FX, DOI 07/17/16    Follow-up visit for her third metatarsal fracture right foot  8 weeks post injury  Fracture site no tenderness  Today's x-ray shows fracture has bridging callus  Encounter Diagnoses  Name Primary?  . Other fracture of right foot, subsequent encounter for fracture with routine healing Yes  . Closed fracture of right foot with routine healing, subsequent encounter      Recommend weightbearing as tolerated remove all the immobilization devices

## 2016-09-19 DIAGNOSIS — J449 Chronic obstructive pulmonary disease, unspecified: Secondary | ICD-10-CM | POA: Diagnosis not present

## 2016-09-20 ENCOUNTER — Encounter (HOSPITAL_COMMUNITY): Payer: Self-pay | Admitting: Emergency Medicine

## 2016-09-20 ENCOUNTER — Emergency Department (HOSPITAL_COMMUNITY): Payer: Medicare PPO

## 2016-09-20 ENCOUNTER — Inpatient Hospital Stay (HOSPITAL_COMMUNITY)
Admission: EM | Admit: 2016-09-20 | Discharge: 2016-09-23 | DRG: 872 | Disposition: A | Discharge status: Home / Self Care | Payer: Medicare PPO | Attending: Family Medicine | Admitting: Family Medicine

## 2016-09-20 DIAGNOSIS — Z882 Allergy status to sulfonamides status: Secondary | ICD-10-CM

## 2016-09-20 DIAGNOSIS — J449 Chronic obstructive pulmonary disease, unspecified: Secondary | ICD-10-CM | POA: Diagnosis present

## 2016-09-20 DIAGNOSIS — F0390 Unspecified dementia without behavioral disturbance: Secondary | ICD-10-CM | POA: Diagnosis present

## 2016-09-20 DIAGNOSIS — A419 Sepsis, unspecified organism: Secondary | ICD-10-CM | POA: Diagnosis not present

## 2016-09-20 DIAGNOSIS — Z7982 Long term (current) use of aspirin: Secondary | ICD-10-CM

## 2016-09-20 DIAGNOSIS — J441 Chronic obstructive pulmonary disease with (acute) exacerbation: Secondary | ICD-10-CM | POA: Diagnosis present

## 2016-09-20 DIAGNOSIS — Z803 Family history of malignant neoplasm of breast: Secondary | ICD-10-CM

## 2016-09-20 DIAGNOSIS — M5489 Other dorsalgia: Secondary | ICD-10-CM | POA: Diagnosis not present

## 2016-09-20 DIAGNOSIS — M549 Dorsalgia, unspecified: Secondary | ICD-10-CM | POA: Diagnosis not present

## 2016-09-20 DIAGNOSIS — R05 Cough: Secondary | ICD-10-CM | POA: Diagnosis not present

## 2016-09-20 DIAGNOSIS — Z881 Allergy status to other antibiotic agents status: Secondary | ICD-10-CM

## 2016-09-20 DIAGNOSIS — I10 Essential (primary) hypertension: Secondary | ICD-10-CM | POA: Diagnosis present

## 2016-09-20 DIAGNOSIS — Z66 Do not resuscitate: Secondary | ICD-10-CM | POA: Diagnosis present

## 2016-09-20 DIAGNOSIS — Z87891 Personal history of nicotine dependence: Secondary | ICD-10-CM

## 2016-09-20 DIAGNOSIS — K219 Gastro-esophageal reflux disease without esophagitis: Secondary | ICD-10-CM | POA: Diagnosis present

## 2016-09-20 DIAGNOSIS — J9612 Chronic respiratory failure with hypercapnia: Secondary | ICD-10-CM | POA: Diagnosis present

## 2016-09-20 DIAGNOSIS — B9620 Unspecified Escherichia coli [E. coli] as the cause of diseases classified elsewhere: Secondary | ICD-10-CM | POA: Diagnosis present

## 2016-09-20 DIAGNOSIS — Z9981 Dependence on supplemental oxygen: Secondary | ICD-10-CM

## 2016-09-20 DIAGNOSIS — M84471D Pathological fracture, right ankle, subsequent encounter for fracture with routine healing: Secondary | ICD-10-CM | POA: Diagnosis not present

## 2016-09-20 DIAGNOSIS — R9431 Abnormal electrocardiogram [ECG] [EKG]: Secondary | ICD-10-CM | POA: Diagnosis not present

## 2016-09-20 DIAGNOSIS — F329 Major depressive disorder, single episode, unspecified: Secondary | ICD-10-CM | POA: Diagnosis present

## 2016-09-20 DIAGNOSIS — Z800 Family history of malignant neoplasm of digestive organs: Secondary | ICD-10-CM

## 2016-09-20 DIAGNOSIS — J9611 Chronic respiratory failure with hypoxia: Secondary | ICD-10-CM | POA: Diagnosis present

## 2016-09-20 DIAGNOSIS — N390 Urinary tract infection, site not specified: Secondary | ICD-10-CM | POA: Diagnosis present

## 2016-09-20 DIAGNOSIS — R319 Hematuria, unspecified: Secondary | ICD-10-CM

## 2016-09-20 DIAGNOSIS — D649 Anemia, unspecified: Secondary | ICD-10-CM | POA: Diagnosis present

## 2016-09-20 DIAGNOSIS — G4733 Obstructive sleep apnea (adult) (pediatric): Secondary | ICD-10-CM | POA: Diagnosis present

## 2016-09-20 DIAGNOSIS — Z888 Allergy status to other drugs, medicaments and biological substances status: Secondary | ICD-10-CM

## 2016-09-20 DIAGNOSIS — R413 Other amnesia: Secondary | ICD-10-CM

## 2016-09-20 LAB — COMPREHENSIVE METABOLIC PANEL
ALBUMIN: 3.8 g/dL (ref 3.5–5.0)
ALT: 14 U/L (ref 14–54)
ANION GAP: 8 (ref 5–15)
AST: 25 U/L (ref 15–41)
Alkaline Phosphatase: 76 U/L (ref 38–126)
BILIRUBIN TOTAL: 0.7 mg/dL (ref 0.3–1.2)
BUN: 18 mg/dL (ref 6–20)
CO2: 28 mmol/L (ref 22–32)
Calcium: 8.9 mg/dL (ref 8.9–10.3)
Chloride: 102 mmol/L (ref 101–111)
Creatinine, Ser: 1.16 mg/dL — ABNORMAL HIGH (ref 0.44–1.00)
GFR calc Af Amer: 52 mL/min — ABNORMAL LOW (ref >60)
GFR calc non Af Amer: 45 mL/min — ABNORMAL LOW (ref >60)
GLUCOSE: 105 mg/dL — AB (ref 65–99)
POTASSIUM: 3.6 mmol/L (ref 3.5–5.1)
SODIUM: 138 mmol/L (ref 135–145)
TOTAL PROTEIN: 7.2 g/dL (ref 6.5–8.1)

## 2016-09-20 LAB — CBC WITH DIFFERENTIAL/PLATELET
BASOS ABS: 0 10*3/uL (ref 0.0–0.1)
Basophils Relative: 0 %
Eosinophils Absolute: 0 10*3/uL (ref 0.0–0.7)
Eosinophils Relative: 0 %
HEMATOCRIT: 34.5 % — AB (ref 36.0–46.0)
HEMOGLOBIN: 10.6 g/dL — AB (ref 12.0–15.0)
LYMPHS PCT: 4 %
Lymphs Abs: 0.7 10*3/uL (ref 0.7–4.0)
MCH: 25.9 pg — ABNORMAL LOW (ref 26.0–34.0)
MCHC: 30.7 g/dL (ref 30.0–36.0)
MCV: 84.4 fL (ref 78.0–100.0)
MONO ABS: 1.4 10*3/uL — AB (ref 0.1–1.0)
Monocytes Relative: 8 %
NEUTROS ABS: 15.8 10*3/uL — AB (ref 1.7–7.7)
Neutrophils Relative %: 88 %
Platelets: 235 10*3/uL (ref 150–400)
RBC: 4.09 MIL/uL (ref 3.87–5.11)
RDW: 16.8 % — AB (ref 11.5–15.5)
WBC: 17.9 10*3/uL — ABNORMAL HIGH (ref 4.0–10.5)

## 2016-09-20 LAB — URINALYSIS, ROUTINE W REFLEX MICROSCOPIC
Bilirubin Urine: NEGATIVE
Glucose, UA: NEGATIVE mg/dL
KETONES UR: NEGATIVE mg/dL
NITRITE: NEGATIVE
PROTEIN: 30 mg/dL — AB
Specific Gravity, Urine: 1.02 (ref 1.005–1.030)
pH: 5.5 (ref 5.0–8.0)

## 2016-09-20 LAB — URINE MICROSCOPIC-ADD ON

## 2016-09-20 LAB — LACTIC ACID, PLASMA: LACTIC ACID, VENOUS: 1.6 mmol/L (ref 0.5–1.9)

## 2016-09-20 LAB — TROPONIN I

## 2016-09-20 LAB — APTT: APTT: 27 s (ref 24–36)

## 2016-09-20 LAB — PROTIME-INR
INR: 1.09
PROTHROMBIN TIME: 14.1 s (ref 11.4–15.2)

## 2016-09-20 LAB — PROCALCITONIN: PROCALCITONIN: 0.9 ng/mL

## 2016-09-20 MED ORDER — ARFORMOTEROL TARTRATE 15 MCG/2ML IN NEBU
15.0000 ug | INHALATION_SOLUTION | Freq: Two times a day (BID) | RESPIRATORY_TRACT | Status: DC
  Administered 2016-09-21 – 2016-09-23 (×5): 15 ug via RESPIRATORY_TRACT
  Filled 2016-09-20 (×5): qty 2

## 2016-09-20 MED ORDER — ACETAMINOPHEN 325 MG PO TABS
650.0000 mg | ORAL_TABLET | Freq: Four times a day (QID) | ORAL | Status: DC | PRN
  Administered 2016-09-20 – 2016-09-21 (×3): 650 mg via ORAL
  Filled 2016-09-20 (×3): qty 2

## 2016-09-20 MED ORDER — ADULT MULTIVITAMIN W/MINERALS CH
1.0000 | ORAL_TABLET | Freq: Every day | ORAL | Status: DC
  Administered 2016-09-21 – 2016-09-23 (×3): 1 via ORAL
  Filled 2016-09-20 (×3): qty 1

## 2016-09-20 MED ORDER — ACETAMINOPHEN 500 MG PO TABS
1000.0000 mg | ORAL_TABLET | Freq: Once | ORAL | Status: AC
  Administered 2016-09-20: 1000 mg via ORAL
  Filled 2016-09-20: qty 2

## 2016-09-20 MED ORDER — ONDANSETRON HCL 4 MG/2ML IJ SOLN: 4.0000 mg | Freq: Four times a day (QID) | INTRAMUSCULAR | Status: DC | PRN

## 2016-09-20 MED ORDER — HYDROCODONE-ACETAMINOPHEN 5-325 MG PO TABS: 1.0000 | ORAL_TABLET | ORAL | Status: DC | PRN

## 2016-09-20 MED ORDER — ONDANSETRON HCL 4 MG PO TABS: 4.0000 mg | ORAL_TABLET | Freq: Four times a day (QID) | ORAL | Status: DC | PRN

## 2016-09-20 MED ORDER — SODIUM CHLORIDE 0.9 % IV BOLUS (SEPSIS)
1000.0000 mL | Freq: Once | INTRAVENOUS | Status: AC
  Administered 2016-09-20: 1000 mL via INTRAVENOUS

## 2016-09-20 MED ORDER — ATORVASTATIN CALCIUM 10 MG PO TABS
10.0000 mg | ORAL_TABLET | Freq: Every day | ORAL | Status: DC
  Administered 2016-09-20 – 2016-09-22 (×3): 10 mg via ORAL
  Filled 2016-09-20 (×3): qty 1

## 2016-09-20 MED ORDER — ACETAMINOPHEN 650 MG RE SUPP: 650.0000 mg | Freq: Four times a day (QID) | RECTAL | Status: DC | PRN

## 2016-09-20 MED ORDER — ENSURE ENLIVE PO LIQD: 237.0000 mL | Freq: Every day | ORAL | Status: DC | PRN

## 2016-09-20 MED ORDER — POLYETHYLENE GLYCOL 3350 17 G PO PACK: 17.0000 g | PACK | Freq: Every day | ORAL | Status: DC | PRN

## 2016-09-20 MED ORDER — VERAPAMIL HCL ER 240 MG PO TBCR
240.0000 mg | EXTENDED_RELEASE_TABLET | Freq: Every day | ORAL | Status: DC
  Administered 2016-09-20 – 2016-09-22 (×3): 240 mg via ORAL
  Filled 2016-09-20 (×5): qty 1

## 2016-09-20 MED ORDER — DONEPEZIL HCL 5 MG PO TABS
10.0000 mg | ORAL_TABLET | Freq: Every day | ORAL | Status: DC
  Administered 2016-09-20 – 2016-09-22 (×3): 10 mg via ORAL
  Filled 2016-09-20: qty 1
  Filled 2016-09-20 (×3): qty 2
  Filled 2016-09-20: qty 1

## 2016-09-20 MED ORDER — DEXTROSE 5 % IV SOLN
1.0000 g | Freq: Once | INTRAVENOUS | Status: AC
  Administered 2016-09-20: 1 g via INTRAVENOUS
  Filled 2016-09-20: qty 10

## 2016-09-20 MED ORDER — LORATADINE 10 MG PO TABS: 10.0000 mg | ORAL_TABLET | Freq: Every day | ORAL | Status: DC | PRN

## 2016-09-20 MED ORDER — PANTOPRAZOLE SODIUM 40 MG PO TBEC
40.0000 mg | DELAYED_RELEASE_TABLET | Freq: Every day | ORAL | Status: DC
  Administered 2016-09-21 – 2016-09-23 (×3): 40 mg via ORAL
  Filled 2016-09-20 (×3): qty 1

## 2016-09-20 MED ORDER — CALCIUM CARBONATE-VITAMIN D 500-200 MG-UNIT PO TABS
1.0000 | ORAL_TABLET | Freq: Every day | ORAL | Status: DC
  Administered 2016-09-21 – 2016-09-23 (×3): 1 via ORAL
  Filled 2016-09-20 (×3): qty 1

## 2016-09-20 MED ORDER — FLUOXETINE HCL 20 MG PO CAPS
40.0000 mg | ORAL_CAPSULE | Freq: Every day | ORAL | Status: DC
  Administered 2016-09-21 – 2016-09-23 (×3): 40 mg via ORAL
  Filled 2016-09-20 (×3): qty 2

## 2016-09-20 MED ORDER — DONEPEZIL HCL 5 MG PO TABS
ORAL_TABLET | ORAL | Status: AC
  Filled 2016-09-20: qty 2

## 2016-09-20 MED ORDER — ASPIRIN EC 81 MG PO TBEC
81.0000 mg | DELAYED_RELEASE_TABLET | Freq: Every day | ORAL | Status: DC
  Administered 2016-09-21 – 2016-09-23 (×3): 81 mg via ORAL
  Filled 2016-09-20 (×3): qty 1

## 2016-09-20 MED ORDER — ENOXAPARIN SODIUM 40 MG/0.4ML ~~LOC~~ SOLN
40.0000 mg | SUBCUTANEOUS | Status: DC
  Administered 2016-09-21 – 2016-09-22 (×2): 40 mg via SUBCUTANEOUS
  Filled 2016-09-20 (×2): qty 0.4

## 2016-09-20 MED ORDER — VERAPAMIL HCL ER 240 MG PO TBCR
EXTENDED_RELEASE_TABLET | ORAL | Status: AC
  Filled 2016-09-20: qty 1

## 2016-09-20 MED ORDER — ALBUTEROL SULFATE (2.5 MG/3ML) 0.083% IN NEBU
2.5000 mg | INHALATION_SOLUTION | RESPIRATORY_TRACT | Status: DC | PRN
  Administered 2016-09-21: 2.5 mg via RESPIRATORY_TRACT
  Filled 2016-09-20: qty 3

## 2016-09-20 NOTE — ED Triage Notes (Signed)
Pt comes in by EMS for lower back pain starting 3 days ago. In addition to this, pt has a non-productive cough and weakness. Denies any n/v/d.   Pts daughter states when she found her mother "she was foaming at the mouth." EMS couldn't confirm this. Pt is alert at this time.

## 2016-09-20 NOTE — ED Notes (Signed)
No coughing noted in ED.

## 2016-09-20 NOTE — ED Provider Notes (Signed)
[facility] DEPT Provider Note   CSN: [id number] Arrival date & time: 09/20/16  1407     History   Chief Complaint Chief Complaint  Patient presents with  . Back Pain  . Cough    HPI [name] is a 75 y.o. female.  Level V caveat for urgent need for intervention. Patient was found at home " foaming at the mouth".  She has not been ill lately. She is feeling better in the ED. Low-grade fever noted. No chest pain, cough, dysuria, flank pain. Patient lives by herself and gets around in a wheelchair. She is on oxygen at home.      Past Medical History:  Diagnosis Date  . Abnormality of gait 06/06/2014  . Allergic rhinitis   . Anemia   . Ankle fracture, right   . Anxiety   . Arm fracture, left   . Arthritis    abnormal gait   . COPD (chronic obstructive pulmonary disease) (HCC)    chronic CO2 retention, decreased DLCO   . Cystic acne    adult   . 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 Degenerative disc disease, lumbar    L5 nerve impingement   . Depression   . DNR (do not resuscitate)   . GERD (gastroesophageal reflux disease)   . History of colon surgery    right hemicolectomy for high-grade adenomas in right colon 2013  . Hyperglycemia   . Hypertension   . Low back pain   . Mediastinal lymphadenopathy 1/9   resolving   . Memory difficulty 09/20/2014   mild dementia  . Onychomycosis   . OSA on CPAP   . Sleep apnea    stop bang score 6    Patient Active Problem List   Diagnosis Date Noted  . COPD exacerbation ([place]) 01/16/2016  . Aspiration pneumonia ([place]) 01/16/2016  . Acute respiratory failure with hypoxia ([place]) 01/16/2016  . Hypercapnic respiratory failure, chronic ([place]) 01/16/2016  . Bimalleolar ankle fracture 01/13/2016  . Need for prophylactic vaccination and inoculation against influenza 08/13/2015  . Annual physical exam 03/13/2015  . Memory difficulty 09/20/2014  . Abnormality of gait 06/06/2014  . Cervical neck pain  with evidence of disc disease 05/20/2014  . Multiple adenomatous polyps 02/15/2013  . Spinal stenosis 09/29/2011  . Falls frequently 09/01/2011  . ALLERGIC RHINITIS, SEASONAL 04/10/2010  . Hyperlipidemia LDL goal <100 01/25/2007  . ANEMIA-NOS 10/05/2006  . DEPRESSION 10/05/2006  . Obstructive sleep apnea 10/05/2006  . Essential hypertension 10/05/2006  . GERD 10/05/2006  . Generalized osteoarthritis of multiple sites 10/05/2006    Past Surgical History:  Procedure Laterality Date  . ABDOMINAL HYSTERECTOMY  1976   secondary to bleeding   . APPENDECTOMY    . COLON RESECTION  08/18/2012   Procedure: HAND ASSISTED LAPAROSCOPIC COLON RESECTION;  Surgeon: Jamesetta So, MD;  Location: [facility];  Service: General;  Laterality: N/A;  . COLONOSCOPY  07/20/2012   Dr. Gala Romney: multiple colonic polyps with large polyps on right side, s/p saline-assisted debulking piecemeal polypectomy and ablation. Not all removed. Path with tubulovillous adenomas, high grade dysplasia.   . COLONOSCOPY N/A 03/09/2013   [id number] polyps-tubular adenomas. S/p right hemicolectomy. next tcs 02/2018  . epidural steroids    . ESOPHAGOGASTRODUODENOSCOPY (EGD) WITH ESOPHAGEAL DILATION N/A 02/06/2014   Procedure: ESOPHAGOGASTRODUODENOSCOPY (EGD) WITH ESOPHAGEAL DILATION;  Surgeon: Daneil Dolin, MD;  Location: [facility];  Service: Endoscopy;  Laterality: N/A;  9:30  . OOPHORECTOMY  1976  . ORIF ANKLE FRACTURE  01/18/2012   Procedure: OPEN REDUCTION INTERNAL FIXATION (ORIF) ANKLE FRACTURE;  Surgeon: Arther Abbott, MD;  Location: [facility];  Service: Orthopedics;  Laterality: Left;  . ORIF ANKLE FRACTURE Right 01/13/2016   Procedure: OPEN REDUCTION INTERNAL FIXATION (ORIF) ANKLE FRACTURE;  Surgeon: Carole Civil, MD;  Location: [facility];  Service: Orthopedics;  Laterality: Right;    OB History    Gravida Para Term Preterm AB Living   5 4 4   1      SAB TAB Ectopic Multiple Live Births   1               Home  Medications    Prior to Admission medications   Medication Sig Start Date End Date Taking? Authorizing Provider  acetaminophen (TYLENOL) 500 MG tablet Take 1,000 mg by mouth 2 (two) times daily.    Yes Historical Provider, MD  Albuterol Sulfate (PROAIR RESPICLICK) [id number] (90 BASE) MCG/ACT AEPB Inhale 2 puffs into the lungs every 8 (eight) hours as needed. 11/11/15  Yes [name], MD  alendronate (FOSAMAX) 70 MG tablet Take 70 mg by mouth once a week. Take with a full glass of water on an empty stomach.   Yes Historical Provider, MD  arformoterol (BROVANA) 15 MCG/2ML NEBU Take 2 mLs (15 mcg total) by nebulization 2 (two) times daily. 04/25/12  Yes [name], MD  aspirin EC 81 MG tablet Take 81 mg by mouth daily.   Yes Historical Provider, MD  atorvastatin (LIPITOR) 10 MG tablet Take 1 tablet (10 mg total) by mouth daily. 04/29/16  Yes [name], MD  calcium-vitamin D (OSCAL WITH D) 500-200 MG-UNIT per tablet Take 1 tablet by mouth daily.    Yes Historical Provider, MD  donepezil (ARICEPT) 10 MG tablet Take 1 tablet (10 mg total) by mouth at bedtime. 04/29/16  Yes [name], MD  ENSURE (ENSURE) Take 1 Can by mouth daily as needed. Take one by mouth with meals    Yes Historical Provider, MD  FLUoxetine (PROZAC) 40 MG capsule Take 1 capsule (40 mg total) by mouth daily. 04/29/16  Yes [name], MD  loratadine (CLARITIN) 10 MG tablet Take 10 mg by mouth daily as needed for allergies.    Yes Historical Provider, MD  Multiple Vitamins-Minerals (CEROVITE SENIOR) TABS Take 1 tablet by mouth daily.    Yes Historical Provider, MD  omeprazole (PRILOSEC) 40 MG capsule Take 1 capsule (40 mg total) by mouth daily. 04/29/16  Yes [name], MD  verapamil (CALAN-SR) 240 MG CR tablet Take 1 tablet (240 mg total) by mouth at bedtime. 04/29/16  Yes [name], MD  Vitamin D, Ergocalciferol, (DRISDOL) 50000 units CAPS capsule Take 50,000 Units by mouth every 7 (seven)  days.   Yes Historical Provider, MD    Family History Family History  Problem Relation Age of Onset  . Stomach cancer Father   . Cancer Father   . Cancer Mother     brain   . Breast cancer Mother   . Arthritis      Social History Social History  Substance Use Topics  . Smoking status: Former Smoker    Packs/day: 1.50    Years: 40.00    Types: Cigarettes    Quit date: 06/21/2006  . Smokeless tobacco: Never Used  . Alcohol use No     Allergies   Fenofibrate; Pravastatin sodium; Sulfonamide derivatives; and Ciprofloxacin   Review of  Systems Review of Systems  Reason unable to perform ROS: Urgent need for intervention.     Physical Exam Updated Vital Signs BP 133/72   Pulse 111   Temp 103 F (39.4 C) (Oral)   Resp 18   Ht 6\' 3"  (1.905 m)   Wt 221 lb (100.2 kg)   SpO2 96%   BMI 27.62 kg/m   Physical Exam  Constitutional: She is oriented to person, place, and time.  Not septic appearing  HENT:  Head: Normocephalic and atraumatic.  Eyes: Conjunctivae are normal.  Neck: Neck supple.  Cardiovascular: Normal rate and regular rhythm.   Pulmonary/Chest: Effort normal and breath sounds normal.  Abdominal: Soft. Bowel sounds are normal.  Musculoskeletal: Normal range of motion.  Neurological: She is alert and oriented to person, place, and time.  Skin: Skin is warm and dry.  Psychiatric: She has a normal mood and affect. Her behavior is normal.  Nursing note and vitals reviewed.    ED Treatments / Results  Labs (all labs ordered are listed, but only abnormal results are displayed) Labs Reviewed  COMPREHENSIVE METABOLIC PANEL - Abnormal; Notable for the following:       Result Value   Glucose, Bld 105 (*)    Creatinine, Ser 1.16 (*)    GFR calc non Af Amer 45 (*)    GFR calc Af Amer 52 (*)    All other components within normal limits  CBC WITH DIFFERENTIAL/PLATELET - Abnormal; Notable for the following:    WBC 17.9 (*)    Hemoglobin 10.6 (*)    HCT  34.5 (*)    MCH 25.9 (*)    RDW 16.8 (*)    Neutro Abs 15.8 (*)    Monocytes Absolute 1.4 (*)    All other components within normal limits  URINALYSIS, ROUTINE W REFLEX MICROSCOPIC (NOT AT [facility]) - Abnormal; Notable for the following:    Hgb urine dipstick LARGE (*)    Protein, ur 30 (*)    Leukocytes, UA MODERATE (*)    All other components within normal limits  URINE MICROSCOPIC-ADD ON - Abnormal; Notable for the following:    Squamous Epithelial / LPF 0-5 (*)    Bacteria, UA MANY (*)    All other components within normal limits  CULTURE, BLOOD (ROUTINE X 2)  URINE CULTURE  TROPONIN I    EKG  EKG Interpretation  Date/Time:  Monday September 20 2016 15:12:05 EST Ventricular Rate:  91 PR Interval:    QRS Duration: 79 QT Interval:  360 QTC Calculation: 443 R Axis:   59 Text Interpretation:  Sinus rhythm Atrial premature complex Borderline repolarization abnormality Confirmed by Lacinda Axon  MD, Nadya Hopwood ([id number]) on 09/20/2016 6:47:03 PM       Radiology Dg Chest 2 View  Result Date: 09/20/2016 CLINICAL DATA:  Nonproductive cough, weakness EXAM: CHEST  2 VIEW COMPARISON:  CTA chest dated 01/15/2016 FINDINGS: Lungs are clear.  No pleural effusion or pneumothorax. The heart is top-normal in size. Degenerative changes of the visualized thoracolumbar spine. IMPRESSION: No evidence of acute cardiopulmonary disease. Electronically Signed   By: Julian Hy M.D.   On: 09/20/2016 14:44    Procedures Procedures (including critical care time)  Medications Ordered in ED Medications  cefTRIAXone (ROCEPHIN) 1 g in dextrose 5 % 50 mL IVPB (0 g Intravenous Stopped 09/20/16 1803)  sodium chloride 0.9 % bolus 1,000 mL (0 mLs Intravenous Stopped 09/20/16 1803)  acetaminophen (TYLENOL) tablet 1,000 mg (1,000 mg Oral Given 09/20/16 1923)  Initial Impression / Assessment and Plan / ED Course  I have reviewed the triage vital signs and the nursing notes.  Pertinent labs & imaging results that  were available during my care of the patient were reviewed by me and considered in my medical decision making (see chart for details).  Clinical Course     Chest x-ray shows no pneumonia. Urinalysis grossly infected. IV fluids, IV Rocephin, Tylenol for fever. Admit to general medicine.  Final Clinical Impressions(s) / ED Diagnoses   Final diagnoses:  Urinary tract infection without hematuria, site unspecified    New Prescriptions New Prescriptions   No medications on file     Nat Christen, MD 09/20/16 2019

## 2016-09-20 NOTE — H&P (Signed)
History and Physical    [name] [id number] DOB: August 13, 1941 DOA: 09/20/2016  PCP: Tula Nakayama, MD   Patient coming from: Home  Chief Complaint: Confusion, generalized weakness  HPI: [name] is a 75 y.o. female with medical history significant for dementia, chronic normocytic anemia, COPD with chronic hypercapnic respiratory failure, GERD, and hypertension who presents to the emergency department with increased confusion and generalized weakness. Patient was noted to report low back pain for the past 2-3 days but had otherwise seem to be in her usual state of health. She lives alone with her daughters nearby who check on her frequently. One of her daughters who is currently at the bedside help the patient get ready this morning at approximately 7 AM and she seemed to be in her usual state at that time. Another daughter, also at the bedside at this time, went to check on her mother around noon today and found her lying on her back, confused and too weak to get up on her own. She was brought into the emergency department for evaluation of this. She has not been on any recent antibiotics and there has been no apparent trauma or injury. She is not anticoagulated. There has been a chronic cough, but no apparent respiratory distress and no nausea, vomiting, or diarrhea. Patient denies back pain upon arrival and also denies dysuria, abdominal pain, nausea, vomiting, or diarrhea. Patient denies chest pain or palpitations. Patient is mildly confused on admission but has no complaints at this time.  ED Course: Upon arrival to the ED, patient was initially afebrile and appeared well, but while in the ED, developed a fever to 39.4 C, tachycardia in the 120s, and blood pressure dipped as low as 93/54. She has been saturating well on her usual 3 L/m of supplemental oxygen. Chest x-ray is negative for acute cardiopulmonary disease and EKG features PAC but otherwise unremarkable.  Chemistry panel was notable for serum creatinine 1.16, up from an apparent baseline of 0.8-0.9. CBC features a leukocytosis to 17,900 and a stable normocytic anemia with hemoglobin of 10.6. Urinalysis appears grossly infected and includes large hemoglobin. Patient was given 2 L of normal saline in the emergency department, blood and urine cultures were obtained, symptomatic care was provided with Tylenol, and she was treated with empiric Rocephin.  Review of Systems:  All other systems reviewed and apart from HPI, are negative.  Past Medical History:  Diagnosis Date  . Abnormality of gait 06/06/2014  . Allergic rhinitis   . Anemia   . Ankle fracture, right   . Anxiety   . Arm fracture, left   . Arthritis    abnormal gait   . COPD (chronic obstructive pulmonary disease) (HCC)    chronic CO2 retention, decreased DLCO   . Cystic acne    adult   . 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 Degenerative disc disease, lumbar    L5 nerve impingement   . Depression   . DNR (do not resuscitate)   . GERD (gastroesophageal reflux disease)   . History of colon surgery    right hemicolectomy for high-grade adenomas in right colon 2013  . Hyperglycemia   . Hypertension   . Low back pain   . Mediastinal lymphadenopathy 1/9   resolving   . Memory difficulty 09/20/2014   mild dementia  . Onychomycosis   . OSA on CPAP   . Sleep apnea    stop bang score 6  Past Surgical History:  Procedure Laterality Date  . ABDOMINAL HYSTERECTOMY  1976   secondary to bleeding   . APPENDECTOMY    . COLON RESECTION  08/18/2012   Procedure: HAND ASSISTED LAPAROSCOPIC COLON RESECTION;  Surgeon: Jamesetta So, MD;  Location: [facility];  Service: General;  Laterality: N/A;  . COLONOSCOPY  07/20/2012   Dr. Gala Romney: multiple colonic polyps with large polyps on right side, s/p saline-assisted debulking piecemeal polypectomy and ablation. Not all removed. Path with tubulovillous adenomas, high grade  dysplasia.   . COLONOSCOPY N/A 03/09/2013   [id number] polyps-tubular adenomas. S/p right hemicolectomy. next tcs 02/2018  . epidural steroids    . ESOPHAGOGASTRODUODENOSCOPY (EGD) WITH ESOPHAGEAL DILATION N/A 02/06/2014   Procedure: ESOPHAGOGASTRODUODENOSCOPY (EGD) WITH ESOPHAGEAL DILATION;  Surgeon: Daneil Dolin, MD;  Location: [facility];  Service: Endoscopy;  Laterality: N/A;  9:30  . OOPHORECTOMY  1976  . ORIF ANKLE FRACTURE  01/18/2012   Procedure: OPEN REDUCTION INTERNAL FIXATION (ORIF) ANKLE FRACTURE;  Surgeon: Arther Abbott, MD;  Location: [facility];  Service: Orthopedics;  Laterality: Left;  . ORIF ANKLE FRACTURE Right 01/13/2016   Procedure: OPEN REDUCTION INTERNAL FIXATION (ORIF) ANKLE FRACTURE;  Surgeon: Carole Civil, MD;  Location: [facility];  Service: Orthopedics;  Laterality: Right;     reports that she quit smoking about 10 years ago. Her smoking use included Cigarettes. She has a 60.00 pack-year smoking history. She has never used smokeless tobacco. She reports that she does not drink alcohol or use drugs.  Allergies  Allergen Reactions  . Fenofibrate Nausea And Vomiting  . Pravastatin Sodium Nausea And Vomiting  . Sulfonamide Derivatives Other (See Comments)    unknown  . Ciprofloxacin Rash    Family History  Problem Relation Age of Onset  . Stomach cancer Father   . Cancer Father   . Cancer Mother     brain   . Breast cancer Mother   . Arthritis       Prior to Admission medications   Medication Sig Start Date End Date Taking? Authorizing Provider  acetaminophen (TYLENOL) 500 MG tablet Take 1,000 mg by mouth 2 (two) times daily.    Yes Historical Provider, MD  Albuterol Sulfate (PROAIR RESPICLICK) [id number] (90 BASE) MCG/ACT AEPB Inhale 2 puffs into the lungs every 8 (eight) hours as needed. 11/11/15  Yes [name], MD  alendronate (FOSAMAX) 70 MG tablet Take 70 mg by mouth once a week. Take with a full glass of water on an empty stomach.   Yes  Historical Provider, MD  arformoterol (BROVANA) 15 MCG/2ML NEBU Take 2 mLs (15 mcg total) by nebulization 2 (two) times daily. 04/25/12  Yes [name], MD  aspirin EC 81 MG tablet Take 81 mg by mouth daily.   Yes Historical Provider, MD  atorvastatin (LIPITOR) 10 MG tablet Take 1 tablet (10 mg total) by mouth daily. 04/29/16  Yes [name], MD  calcium-vitamin D (OSCAL WITH D) 500-200 MG-UNIT per tablet Take 1 tablet by mouth daily.    Yes Historical Provider, MD  donepezil (ARICEPT) 10 MG tablet Take 1 tablet (10 mg total) by mouth at bedtime. 04/29/16  Yes [name], MD  ENSURE (ENSURE) Take 1 Can by mouth daily as needed. Take one by mouth with meals    Yes Historical Provider, MD  FLUoxetine (PROZAC) 40 MG capsule Take 1 capsule (40 mg total) by mouth daily. 04/29/16  Yes [name], MD  loratadine (CLARITIN) 10  MG tablet Take 10 mg by mouth daily as needed for allergies.    Yes Historical Provider, MD  Multiple Vitamins-Minerals (CEROVITE SENIOR) TABS Take 1 tablet by mouth daily.    Yes Historical Provider, MD  omeprazole (PRILOSEC) 40 MG capsule Take 1 capsule (40 mg total) by mouth daily. 04/29/16  Yes [name], MD  verapamil (CALAN-SR) 240 MG CR tablet Take 1 tablet (240 mg total) by mouth at bedtime. 04/29/16  Yes [name], MD  Vitamin D, Ergocalciferol, (DRISDOL) 50000 units CAPS capsule Take 50,000 Units by mouth every 7 (seven) days.   Yes Historical Provider, MD    Physical Exam: Vitals:   09/20/16 2018 09/20/16 2021 09/20/16 2024 09/20/16 2027  BP:      Pulse: (!) 124 (!) 121 (!) 124   Resp: 17 15 17 20   Temp:      TempSrc:      SpO2: 94% 93% 96%   Weight:      Height:          Constitutional: No respiratory distress, calm, sweaty brow Eyes: PERTLA, lids and conjunctivae normal ENMT: Mucous membranes are moist. Posterior pharynx clear of any exudate or lesions.   Neck: normal, supple, no masses, no  thyromegaly Respiratory: clear to auscultation bilaterally, no wheezing, no crackles. Normal respiratory effort.   Cardiovascular: Rate ~120 and regular. No extremity edema. No significant JVD. Abdomen: No distension, no tenderness, no masses palpated. Bowel sounds normal.  Musculoskeletal: no clubbing / cyanosis. No joint deformity upper and lower extremities. Normal muscle tone.  Skin: no significant rashes, lesions, ulcers. Warm, well-perfused. Sweaty Neurologic: CN 2-12 grossly intact. Sensation intact, DTR normal. Strength 5/5 in all 4 limbs.  Psychiatric:  Alert and oriented to person and place, but not oriented to month, year, or situation. Normal mood and affect.     Labs on Admission: I have personally reviewed following labs and imaging studies  CBC:  Recent Labs Lab 09/20/16 1447  WBC 17.9*  NEUTROABS 15.8*  HGB 10.6*  HCT 34.5*  MCV 84.4  PLT [id number]   Basic Metabolic Panel:  Recent Labs Lab 09/20/16 1447  NA 138  K 3.6  CL 102  CO2 28  GLUCOSE 105*  BUN 18  CREATININE 1.16*  CALCIUM 8.9   GFR: Estimated Creatinine Clearance: 58.3 mL/min (by C-G formula based on SCr of 1.16 mg/dL (H)). Liver Function Tests:  Recent Labs Lab 09/20/16 1447  AST 25  ALT 14  ALKPHOS 76  BILITOT 0.7  PROT 7.2  ALBUMIN 3.8   No results for input(s): LIPASE, AMYLASE in the last 168 hours. No results for input(s): AMMONIA in the last 168 hours. Coagulation Profile: No results for input(s): INR, PROTIME in the last 168 hours. Cardiac Enzymes:  Recent Labs Lab 09/20/16 1447  TROPONINI <0.03   BNP (last 3 results) No results for input(s): PROBNP in the last 8760 hours. HbA1C: No results for input(s): HGBA1C in the last 72 hours. CBG: No results for input(s): GLUCAP in the last 168 hours. Lipid Profile: No results for input(s): CHOL, HDL, LDLCALC, TRIG, CHOLHDL, LDLDIRECT in the last 72 hours. Thyroid Function Tests: No results for input(s): TSH, T4TOTAL, FREET4,  T3FREE, THYROIDAB in the last 72 hours. Anemia Panel: No results for input(s): VITAMINB12, FOLATE, FERRITIN, TIBC, IRON, RETICCTPCT in the last 72 hours. Urine analysis:    Component Value Date/Time   COLORURINE YELLOW 09/20/2016 1507   APPEARANCEUR CLEAR 09/20/2016 1507   LABSPEC 1.020 09/20/2016 1507  PHURINE 5.5 09/20/2016 1507   GLUCOSEU NEGATIVE 09/20/2016 1507   HGBUR LARGE (A) 09/20/2016 1507   HGBUR negative 10/30/2010 0822   BILIRUBINUR NEGATIVE 09/20/2016 1507   BILIRUBINUR small 05/20/2014 1349   KETONESUR NEGATIVE 09/20/2016 1507   PROTEINUR 30 (A) 09/20/2016 1507   UROBILINOGEN 0.2 06/22/2014 2250   NITRITE NEGATIVE 09/20/2016 1507   LEUKOCYTESUR MODERATE (A) 09/20/2016 1507   Sepsis Labs: @LABRCNTIP (procalcitonin:4,lacticidven:4) ) Recent Results (from the past 240 hour(s))  Culture, blood (Routine x 2)     Status: None (Preliminary result)   Collection Time: 09/20/16  2:48 PM  Result Value Ref Range Status   Specimen Description BLOOD BLOOD RIGHT ARM  Final   Special Requests BOTTLES DRAWN AEROBIC AND ANAEROBIC 6 CC EACH  Final   Culture PENDING  Incomplete   Report Status PENDING  Incomplete     Radiological Exams on Admission: Dg Chest 2 View  Result Date: 09/20/2016 CLINICAL DATA:  Nonproductive cough, weakness EXAM: CHEST  2 VIEW COMPARISON:  CTA chest dated 01/15/2016 FINDINGS: Lungs are clear.  No pleural effusion or pneumothorax. The heart is top-normal in size. Degenerative changes of the visualized thoracolumbar spine. IMPRESSION: No evidence of acute cardiopulmonary disease. Electronically Signed   By: Julian Hy M.D.   On: 09/20/2016 14:44    EKG: Independently reviewed. Sinus rhythm, PAC  Assessment/Plan  1. Sepsis secondary to UTI - Did not appear toxic on initial presentation to ED and was afebrile with normal HR at that time - While still in ED, pt developed fever, tachycardia, hypotension, diaphoresis; she is mildly confused beyond  her baseline and has marked leukocytosis and grossly infected urine  - CXR is clear, abd exam benign, lactate is pending  - Blood and urine cultures obtained in ED and she was given a 2 L NS bolus  - Will complete the 30 cc/kg bolus and continue empiric Rocephin for suspected UTI while awaiting culture data   2. Hypertension  - BP was soft initially but has normalized with IVF  - Cautiously continue verapamil as tolerated    3. GERD  - No esophagitis on EGD in 2015  - Continue daily PPI    4. Dementia - Confusion a little worse than baseline on admission per report of patient's daughters, likely secondary to sepsis  - Continue Aricept    5. Normocytic anemia  - Hgb is 10.6 on admission and stable relative to priors  - No sign of bleeding    6. COPD with chronic hypercapnic respiratory failure - Stable on admission with no evidence for exacerbation  - Continue Brovana, prn albuterol, supplemental O2    DVT prophylaxis: sq Lovenox  Code Status: DNR Family Communication: Daughters updated at bedside Disposition Plan: Observe on telemetry Consults called: None Admission status: Observation    Vianne Bulls, MD [facility] Pager [phone]  If 7PM-7AM, please contact night-coverage [email or web address] Password [facility]  09/20/2016, 8:42 PM

## 2016-09-20 NOTE — ED Notes (Signed)
2nd bl cx and lactic acid cancelled per vo dr cook. Pt In nad.

## 2016-09-20 NOTE — ED Notes (Signed)
Pt has not arrived to room. Still in xray

## 2016-09-21 DIAGNOSIS — G4733 Obstructive sleep apnea (adult) (pediatric): Secondary | ICD-10-CM

## 2016-09-21 DIAGNOSIS — J449 Chronic obstructive pulmonary disease, unspecified: Secondary | ICD-10-CM | POA: Diagnosis not present

## 2016-09-21 DIAGNOSIS — F329 Major depressive disorder, single episode, unspecified: Secondary | ICD-10-CM | POA: Diagnosis present

## 2016-09-21 DIAGNOSIS — Z9981 Dependence on supplemental oxygen: Secondary | ICD-10-CM | POA: Diagnosis not present

## 2016-09-21 DIAGNOSIS — D649 Anemia, unspecified: Secondary | ICD-10-CM | POA: Diagnosis present

## 2016-09-21 DIAGNOSIS — A419 Sepsis, unspecified organism: Secondary | ICD-10-CM | POA: Diagnosis not present

## 2016-09-21 DIAGNOSIS — Z800 Family history of malignant neoplasm of digestive organs: Secondary | ICD-10-CM | POA: Diagnosis not present

## 2016-09-21 DIAGNOSIS — Z66 Do not resuscitate: Secondary | ICD-10-CM | POA: Diagnosis present

## 2016-09-21 DIAGNOSIS — Z803 Family history of malignant neoplasm of breast: Secondary | ICD-10-CM | POA: Diagnosis not present

## 2016-09-21 DIAGNOSIS — Z882 Allergy status to sulfonamides status: Secondary | ICD-10-CM | POA: Diagnosis not present

## 2016-09-21 DIAGNOSIS — Z87891 Personal history of nicotine dependence: Secondary | ICD-10-CM | POA: Diagnosis not present

## 2016-09-21 DIAGNOSIS — B9620 Unspecified Escherichia coli [E. coli] as the cause of diseases classified elsewhere: Secondary | ICD-10-CM | POA: Diagnosis present

## 2016-09-21 DIAGNOSIS — J9612 Chronic respiratory failure with hypercapnia: Secondary | ICD-10-CM | POA: Diagnosis present

## 2016-09-21 DIAGNOSIS — I10 Essential (primary) hypertension: Secondary | ICD-10-CM | POA: Diagnosis not present

## 2016-09-21 DIAGNOSIS — K219 Gastro-esophageal reflux disease without esophagitis: Secondary | ICD-10-CM | POA: Diagnosis present

## 2016-09-21 DIAGNOSIS — Z888 Allergy status to other drugs, medicaments and biological substances status: Secondary | ICD-10-CM | POA: Diagnosis not present

## 2016-09-21 DIAGNOSIS — N390 Urinary tract infection, site not specified: Secondary | ICD-10-CM | POA: Diagnosis not present

## 2016-09-21 DIAGNOSIS — Z881 Allergy status to other antibiotic agents status: Secondary | ICD-10-CM | POA: Diagnosis not present

## 2016-09-21 DIAGNOSIS — Z7982 Long term (current) use of aspirin: Secondary | ICD-10-CM | POA: Diagnosis not present

## 2016-09-21 DIAGNOSIS — F0390 Unspecified dementia without behavioral disturbance: Secondary | ICD-10-CM | POA: Diagnosis present

## 2016-09-21 DIAGNOSIS — M549 Dorsalgia, unspecified: Secondary | ICD-10-CM | POA: Diagnosis present

## 2016-09-21 LAB — BASIC METABOLIC PANEL
Anion gap: 5 (ref 5–15)
BUN: 20 mg/dL (ref 6–20)
CHLORIDE: 107 mmol/L (ref 101–111)
CO2: 28 mmol/L (ref 22–32)
CREATININE: 1 mg/dL (ref 0.44–1.00)
Calcium: 7.9 mg/dL — ABNORMAL LOW (ref 8.9–10.3)
GFR calc Af Amer: >60 mL/min (ref >60)
GFR calc non Af Amer: 54 mL/min — ABNORMAL LOW (ref >60)
GLUCOSE: 93 mg/dL (ref 65–99)
Potassium: 3.4 mmol/L — ABNORMAL LOW (ref 3.5–5.1)
SODIUM: 140 mmol/L (ref 135–145)

## 2016-09-21 LAB — CBC WITH DIFFERENTIAL/PLATELET
Basophils Absolute: 0 10*3/uL (ref 0.0–0.1)
Basophils Relative: 0 %
EOS ABS: 0.1 10*3/uL (ref 0.0–0.7)
EOS PCT: 0 %
HCT: 31.9 % — ABNORMAL LOW (ref 36.0–46.0)
Hemoglobin: 9.6 g/dL — ABNORMAL LOW (ref 12.0–15.0)
LYMPHS ABS: 1.6 10*3/uL (ref 0.7–4.0)
Lymphocytes Relative: 9 %
MCH: 25.8 pg — AB (ref 26.0–34.0)
MCHC: 30.1 g/dL (ref 30.0–36.0)
MCV: 85.8 fL (ref 78.0–100.0)
MONO ABS: 1.3 10*3/uL — AB (ref 0.1–1.0)
Monocytes Relative: 7 %
Neutro Abs: 14.9 10*3/uL — ABNORMAL HIGH (ref 1.7–7.7)
Neutrophils Relative %: 84 %
PLATELETS: 218 10*3/uL (ref 150–400)
RBC: 3.72 MIL/uL — AB (ref 3.87–5.11)
RDW: 17 % — AB (ref 11.5–15.5)
WBC: 17.8 10*3/uL — AB (ref 4.0–10.5)

## 2016-09-21 LAB — GLUCOSE, CAPILLARY: GLUCOSE-CAPILLARY: 87 mg/dL (ref 65–99)

## 2016-09-21 LAB — LACTIC ACID, PLASMA: LACTIC ACID, VENOUS: 1.4 mmol/L (ref 0.5–1.9)

## 2016-09-21 MED ORDER — POTASSIUM CHLORIDE IN NACL 20-0.9 MEQ/L-% IV SOLN
INTRAVENOUS | Status: DC
  Administered 2016-09-21: 18:00:00 via INTRAVENOUS

## 2016-09-21 MED ORDER — DEXTROSE 5 % IV SOLN
1.0000 g | INTRAVENOUS | Status: DC
  Administered 2016-09-21 – 2016-09-22 (×2): 1 g via INTRAVENOUS
  Filled 2016-09-21 (×3): qty 10

## 2016-09-21 MED ORDER — SODIUM CHLORIDE 0.9 % IV SOLN: INTRAVENOUS | Status: DC

## 2016-09-21 MED ORDER — POTASSIUM CHLORIDE CRYS ER 20 MEQ PO TBCR
40.0000 meq | EXTENDED_RELEASE_TABLET | Freq: Once | ORAL | Status: AC
  Administered 2016-09-21: 40 meq via ORAL
  Filled 2016-09-21: qty 2

## 2016-09-21 NOTE — Progress Notes (Signed)
Pharmacy Antibiotic Note  [name] is a 75 y.o. female admitted on 09/20/2016 with UTI.  Pharmacy has been consulted for Ceftriaxone dosing.  Plan: Ceftriaxone 1gm IV q24h F/U cultures and clinical progress  Height: 6\' 1"  (185.4 cm) Weight: 222 lb 3.6 oz (100.8 kg) IBW/kg (Calculated) : 75.4  Temp (24hrs), Avg:99.8 F (37.7 C), Min:98.8 F (37.1 C), Max:103 F (39.4 C)   Recent Labs Lab 09/20/16 1447 09/20/16 2105 09/20/16 2327 09/21/16 0606  WBC 17.9*  --   --  17.8*  CREATININE 1.16*  --   --  1.00  LATICACIDVEN  --  1.6 1.4  --     Estimated Creatinine Clearance: 65.7 mL/min (by C-G formula based on SCr of 1 mg/dL).    Allergies  Allergen Reactions  . Fenofibrate Nausea And Vomiting  . Pravastatin Sodium Nausea And Vomiting  . Sulfonamide Derivatives Other (See Comments)    unknown  . Ciprofloxacin Rash    Antimicrobials this admission: Ceftriaxone 11/6>>   Dose adjustments this admission: n/a  Microbiology results: 11/6 Blood cx: pending 11/6 UCx:  In process  Thank you for allowing pharmacy to be a part of this patient's care.  Isac Sarna, BS Pharm D, [place] Clinical Pharmacist Pager [phone] 09/21/2016 8:56 AM

## 2016-09-21 NOTE — Care Management Obs Status (Signed)
[facility] NOTIFICATION   Patient Details  Name: [name] MRN: [id number] Date of Birth: 28-Nov-1940   Medicare Observation Status Notification Given:  Yes    Sherald Barge, RN 09/21/2016, 2:03 PM

## 2016-09-21 NOTE — Care Management Note (Signed)
Case Management Note  Patient Details  Name: [name] MRN: [id number] Date of Birth: October 07, 1941  Subjective/Objective:                  Pt admitted with sepsis. She is from home, lives alone and has two daughters who check in on her often. She requires assistance with ADL's. She has no HH services. She uses a walker and WC for mobility. She has home oxygen through [employer], neb and is supposed to use a CPAP but doesn't. Daughters at bedside. Pt plans to return home with resumption of previous arrangement. Daughters do not feel [place] would be helpful.   Action/Plan: No CM needs anticipated.   Expected Discharge Date:      09/22/2016            Expected Discharge Plan:  Home/Self Care  In-House Referral:  NA  Discharge planning Services  CM Consult  Post Acute Care Choice:  NA Choice offered to:  NA  Status of Service:  Completed, signed off  Sherald Barge, RN 09/21/2016, 2:04 PM

## 2016-09-21 NOTE — Progress Notes (Signed)
PROGRESS NOTE    [name]  [id number] DOB: Aug 23, 1941 DOA: 09/20/2016 PCP: Tula Nakayama, MD    Brief Narrative:  31 yof with a history of for dementia, normocytic anemia, COPD with chronic hypercapnic respiratory failure, GERD, OSA on CPAP, and hypertension, presented with complaints of confusion and generalized weakness. While in the ED, she was noted to be febrile, tachycardic and hypotensive. Serology showed WBC 17,900, hemoglobin 10.6, and creatinine 1.16. UA indicative of infection. Chest x-ray is negative for acute cardiopulmonary disease. She was started on IV fluids, tylenol, and empiric antibiotic Rocephin. She was admitted for further evaluation of sepsis secondary to a urinary tract infection.   Assessment & Plan:   Principal Problem:   Sepsis secondary to UTI [facility]) Active Problems:   Normocytic anemia   COPD (chronic obstructive pulmonary disease) (HCC)   Obstructive sleep apnea   Essential hypertension   GERD   Memory difficulty   Hypercapnic respiratory failure, chronic (HCC)   UTI (urinary tract infection)  1. Sepsis. This is secondary to a UTI. Patient did not appear toxic initially , in which she was afebrile with a normal heart rate. While in the ED, she became febrile, tachycardic, hypotensive, and diaphoretic. She is mildly confused and she has leukocytosis with a WBC of 17.8. Follow blood and urine cultures. Continue on IV fluids and empiric antibiotic rocephin.  2. UTI. UA is indicative of infection. Follow cultures. Continue IV antibiotic rocephin.  3. Hypertension. Blood pressures are elevated. Will continue on verapamil.  4. COPD with chronic hypercapnic respiratory failure. Compensated at this time. Continue on Brovana, supplemental oxygen, and nebulizer as needed.  5. Normocytic anemia. Hemoglobin was 10.6 on admission, trended down to 9.6. There are no signs of bleeding. Will continue to monitor.  6. GERD. Continue Protonix.  7. Dementia.  Patient has increased confusion than normal, likely secondary to sepsis. Continue Aricept.   DVT prophylaxis: Lovenox  Code Status: DO NOT RESUSCITATE  Family Communication: No family bedside Disposition Plan: Discharge home once improved.    Consultants:   None   Procedures:   None   Antimicrobials:   Rocephin 11/7 >>   Subjective: No shortness of breath or cough. No abdominal pain  Objective: Vitals:   09/20/16 2130 09/20/16 2134 09/20/16 2257 09/21/16 0548  BP: 100/62  (!) 91/50 (!) 142/62  Pulse: 101  98 84  Resp: 13  16 18   Temp:  99.1 F (37.3 C) 98.8 F (37.1 C) 98.8 F (37.1 C)  TempSrc:  Oral Oral Oral  SpO2: 98%  98% 95%  Weight:   100.8 kg (222 lb 3.6 oz)   Height:   6\' 1"  (1.854 m)     Intake/Output Summary (Last 24 hours) at 09/21/16 0711 Last data filed at 09/21/16 0543  Gross per 24 hour  Intake             3290 ml  Output                0 ml  Net             3290 ml   Filed Weights   09/20/16 1413 09/20/16 2257  Weight: 100.2 kg (221 lb) 100.8 kg (222 lb 3.6 oz)    Examination:  General exam: Appears calm and comfortable  Respiratory system: Clear to auscultation. Respiratory effort normal. Cardiovascular system: S1 & S2 heard, RRR. No JVD, murmurs, rubs, gallops or clicks. No pedal edema. Gastrointestinal system: Abdomen is nondistended, soft and  nontender. No organomegaly or masses felt. Normal bowel sounds heard. Central nervous system: Alert and oriented. No focal neurological deficits. Extremities: Symmetric 5 x 5 power. Skin: No rashes, lesions or ulcers Psychiatry: Judgement and insight appear normal. Mood & affect appropriate.     Data Reviewed: I have personally reviewed following labs and imaging studies  CBC:  Recent Labs Lab 09/20/16 1447  WBC 17.9*  NEUTROABS 15.8*  HGB 10.6*  HCT 34.5*  MCV 84.4  PLT [id number]   Basic Metabolic Panel:  Recent Labs Lab 09/20/16 1447 09/21/16 0606  NA 138 140  K 3.6 3.4*  CL  102 107  CO2 28 28  GLUCOSE 105* 93  BUN 18 20  CREATININE 1.16* 1.00  CALCIUM 8.9 7.9*   GFR: Estimated Creatinine Clearance: 65.7 mL/min (by C-G formula based on SCr of 1 mg/dL). Liver Function Tests:  Recent Labs Lab 09/20/16 1447  AST 25  ALT 14  ALKPHOS 76  BILITOT 0.7  PROT 7.2  ALBUMIN 3.8   No results for input(s): LIPASE, AMYLASE in the last 168 hours. No results for input(s): AMMONIA in the last 168 hours. Coagulation Profile:  Recent Labs Lab 09/20/16 2105  INR 1.09   Cardiac Enzymes:  Recent Labs Lab 09/20/16 1447  TROPONINI <0.03   BNP (last 3 results) No results for input(s): PROBNP in the last 8760 hours. HbA1C: No results for input(s): HGBA1C in the last 72 hours. CBG: No results for input(s): GLUCAP in the last 168 hours. Lipid Profile: No results for input(s): CHOL, HDL, LDLCALC, TRIG, CHOLHDL, LDLDIRECT in the last 72 hours. Thyroid Function Tests: No results for input(s): TSH, T4TOTAL, FREET4, T3FREE, THYROIDAB in the last 72 hours. Anemia Panel: No results for input(s): VITAMINB12, FOLATE, FERRITIN, TIBC, IRON, RETICCTPCT in the last 72 hours. Sepsis Labs:  Recent Labs Lab 09/20/16 2105 09/20/16 2327  PROCALCITON 0.90  --   LATICACIDVEN 1.6 1.4    Recent Results (from the past 240 hour(s))  Culture, blood (Routine x 2)     Status: None (Preliminary result)   Collection Time: 09/20/16  2:48 PM  Result Value Ref Range Status   Specimen Description BLOOD BLOOD RIGHT ARM  Final   Special Requests BOTTLES DRAWN AEROBIC AND ANAEROBIC 6 CC EACH  Final   Culture PENDING  Incomplete   Report Status PENDING  Incomplete  Culture, blood (single)     Status: None (Preliminary result)   Collection Time: 09/20/16  9:05 PM  Result Value Ref Range Status   Specimen Description BLOOD LEFT HAND  Final   Special Requests BOTTLES DRAWN AEROBIC AND ANAEROBIC [place]  Final   Culture PENDING  Incomplete   Report Status PENDING  Incomplete          Radiology Studies: Dg Chest 2 View  Result Date: 09/20/2016 CLINICAL DATA:  Nonproductive cough, weakness EXAM: CHEST  2 VIEW COMPARISON:  CTA chest dated 01/15/2016 FINDINGS: Lungs are clear.  No pleural effusion or pneumothorax. The heart is top-normal in size. Degenerative changes of the visualized thoracolumbar spine. IMPRESSION: No evidence of acute cardiopulmonary disease. Electronically Signed   By: Julian Hy M.D.   On: 09/20/2016 14:44        Scheduled Meds: . arformoterol  15 mcg Nebulization BID  . aspirin EC  81 mg Oral Daily  . atorvastatin  10 mg Oral q1800  . calcium-vitamin D  1 tablet Oral Daily  . donepezil  10 mg Oral QHS  . enoxaparin (LOVENOX) injection  40 mg Subcutaneous Q24H  . FLUoxetine  40 mg Oral Daily  . multivitamin with minerals  1 tablet Oral Daily  . pantoprazole  40 mg Oral Daily  . verapamil  240 mg Oral QHS   Continuous Infusions:   LOS: 0 days    Time spent: 25 minutes    Kathie Dike, MD [facility] If 7PM-7AM, please contact night-coverage [email or web address] Password [email or web address] 09/21/2016, 7:11 AM

## 2016-09-22 LAB — BASIC METABOLIC PANEL
Anion gap: 4 — ABNORMAL LOW (ref 5–15)
BUN: 19 mg/dL (ref 6–20)
CHLORIDE: 109 mmol/L (ref 101–111)
CO2: 28 mmol/L (ref 22–32)
CREATININE: 0.9 mg/dL (ref 0.44–1.00)
Calcium: 7.8 mg/dL — ABNORMAL LOW (ref 8.9–10.3)
GFR calc Af Amer: >60 mL/min (ref >60)
GFR calc non Af Amer: >60 mL/min (ref >60)
Glucose, Bld: 87 mg/dL (ref 65–99)
Potassium: 4 mmol/L (ref 3.5–5.1)
Sodium: 141 mmol/L (ref 135–145)

## 2016-09-22 LAB — CBC
HCT: 28.5 % — ABNORMAL LOW (ref 36.0–46.0)
Hemoglobin: 8.5 g/dL — ABNORMAL LOW (ref 12.0–15.0)
MCH: 25.4 pg — AB (ref 26.0–34.0)
MCHC: 29.8 g/dL — ABNORMAL LOW (ref 30.0–36.0)
MCV: 85.3 fL (ref 78.0–100.0)
PLATELETS: 201 10*3/uL (ref 150–400)
RBC: 3.34 MIL/uL — AB (ref 3.87–5.11)
RDW: 16.9 % — AB (ref 11.5–15.5)
WBC: 11 10*3/uL — ABNORMAL HIGH (ref 4.0–10.5)

## 2016-09-22 LAB — GLUCOSE, CAPILLARY: Glucose-Capillary: 84 mg/dL (ref 65–99)

## 2016-09-22 NOTE — Progress Notes (Signed)
PROGRESS NOTE    [name]  [id number] DOB: June 15, 1941 DOA: 09/20/2016 PCP: Tula Nakayama, MD    Brief Narrative:  39 yof with a history of for dementia, normocytic anemia, COPD with chronic hypercapnic respiratory failure, GERD, OSA on CPAP, and hypertension, presented with complaints of confusion and generalized weakness. While in the ED, she was noted to be febrile, tachycardic and hypotensive. Serology showed WBC 17,900, hemoglobin 10.6, and creatinine 1.16. UA indicative of infection. Chest x-ray is negative for acute cardiopulmonary disease. She was started on IV fluids, tylenol, and empiric antibiotic Rocephin. She was admitted for further evaluation of sepsis secondary to a urinary tract infection.   Assessment & Plan:   Principal Problem:   Sepsis secondary to UTI [facility]) Active Problems:   Normocytic anemia   COPD (chronic obstructive pulmonary disease) (HCC)   Obstructive sleep apnea   Essential hypertension   GERD   Memory difficulty   Hypercapnic respiratory failure, chronic (HCC)   UTI (urinary tract infection)  1. Sepsis. This is secondary to a UTI. Patient did not appear toxic initially , in which she was afebrile with a normal heart rate. While in the ED, she became febrile, tachycardic, hypotensive, and diaphoretic. She was started on IV antibiotics and has improved. Blood cultures have shown no growth to date. Urine culture positive for E coli. Follow up sensitivities. Continue on IV fluids and rocephin.  2. UTI. UA is indicative of infection. Urine culture positive for E coli. Follow up sensitivities. Continue IV antibiotic rocephin.  3. Hypertension. Blood pressures are elevated. Will continue on verapamil.  4. COPD with chronic hypercapnic respiratory failure on home oxygen. Compensated at this time. Continue on Brovana, supplemental oxygen, and nebulizer as needed.  5. Normocytic anemia. Chronic. Likely has dilutional component. No signs of bleeding.  Continue to monitor.  6. GERD. Continue Protonix.  7. Dementia. Patient had increased confusion than normal, likely secondary to sepsis. Continue Aricept. Mental status appears to be back to baseline  DVT prophylaxis: Lovenox  Code Status: DO NOT RESUSCITATE  Family Communication: daughter at bedside Disposition Plan: Discharge home once improved.    Consultants:   None   Procedures:   None   Antimicrobials:   Rocephin 11/7 >>   Subjective: No new complaints. Had fever overnight. No cough.  Objective: Vitals:   09/21/16 2200 09/22/16 0500 09/22/16 0731 09/22/16 1441  BP:  133/74  132/82  Pulse:  (!) 101  94  Resp:  18  20  Temp: 99.1 F (37.3 C) 98.9 F (37.2 C)  98.4 F (36.9 C)  TempSrc:  Oral  Oral  SpO2:  94% 95% 96%  Weight:      Height:        Intake/Output Summary (Last 24 hours) at 09/22/16 1656 Last data filed at 09/22/16 1300  Gross per 24 hour  Intake           1637.5 ml  Output              600 ml  Net           1037.5 ml   Filed Weights   09/20/16 1413 09/20/16 2257  Weight: 100.2 kg (221 lb) 100.8 kg (222 lb 3.6 oz)    Examination:  General exam: Appears calm and comfortable  Respiratory system: Clear to auscultation. Respiratory effort normal. Cardiovascular system: S1 & S2 heard, RRR. No JVD, murmurs, rubs, gallops or clicks. No pedal edema. Gastrointestinal system: Abdomen is nondistended, soft and  nontender. No organomegaly or masses felt. Normal bowel sounds heard. Central nervous system: Alert and oriented. No focal neurological deficits. Extremities: Symmetric 5 x 5 power. Skin: No rashes, lesions or ulcers Psychiatry: Judgement and insight appear normal. Mood & affect appropriate.     Data Reviewed: I have personally reviewed following labs and imaging studies  CBC:  Recent Labs Lab 09/20/16 1447 09/21/16 0606 09/22/16 0548  WBC 17.9* 17.8* 11.0*  NEUTROABS 15.8* 14.9*  --   HGB 10.6* 9.6* 8.5*  HCT 34.5* 31.9* 28.5*   MCV 84.4 85.8 85.3  PLT 235 218 [id number]   Basic Metabolic Panel:  Recent Labs Lab 09/20/16 1447 09/21/16 0606 09/22/16 0548  NA 138 140 141  K 3.6 3.4* 4.0  CL 102 107 109  CO2 28 28 28   GLUCOSE 105* 93 87  BUN 18 20 19   CREATININE 1.16* 1.00 0.90  CALCIUM 8.9 7.9* 7.8*   GFR: Estimated Creatinine Clearance: 73 mL/min (by C-G formula based on SCr of 0.9 mg/dL). Liver Function Tests:  Recent Labs Lab 09/20/16 1447  AST 25  ALT 14  ALKPHOS 76  BILITOT 0.7  PROT 7.2  ALBUMIN 3.8   No results for input(s): LIPASE, AMYLASE in the last 168 hours. No results for input(s): AMMONIA in the last 168 hours. Coagulation Profile:  Recent Labs Lab 09/20/16 2105  INR 1.09   Cardiac Enzymes:  Recent Labs Lab 09/20/16 1447  TROPONINI <0.03   BNP (last 3 results) No results for input(s): PROBNP in the last 8760 hours. HbA1C: No results for input(s): HGBA1C in the last 72 hours. CBG:  Recent Labs Lab 09/21/16 0729 09/22/16 0726  GLUCAP 87 84   Lipid Profile: No results for input(s): CHOL, HDL, LDLCALC, TRIG, CHOLHDL, LDLDIRECT in the last 72 hours. Thyroid Function Tests: No results for input(s): TSH, T4TOTAL, FREET4, T3FREE, THYROIDAB in the last 72 hours. Anemia Panel: No results for input(s): VITAMINB12, FOLATE, FERRITIN, TIBC, IRON, RETICCTPCT in the last 72 hours. Sepsis Labs:  Recent Labs Lab 09/20/16 2105 09/20/16 2327  PROCALCITON 0.90  --   LATICACIDVEN 1.6 1.4    Recent Results (from the past 240 hour(s))  Culture, blood (Routine x 2)     Status: None (Preliminary result)   Collection Time: 09/20/16  2:48 PM  Result Value Ref Range Status   Specimen Description BLOOD BLOOD RIGHT ARM  Final   Special Requests BOTTLES DRAWN AEROBIC AND ANAEROBIC 6 CC EACH  Final   Culture NO GROWTH 2 DAYS  Final   Report Status PENDING  Incomplete  Urine culture     Status: Abnormal (Preliminary result)   Collection Time: 09/20/16  3:07 PM  Result Value Ref  Range Status   Specimen Description URINE, CLEAN CATCH  Final   Special Requests NONE  Final   Culture >=100,000 COLONIES/mL ESCHERICHIA COLI (A)  Final   Report Status PENDING  Incomplete  Culture, blood (single)     Status: None (Preliminary result)   Collection Time: 09/20/16  9:05 PM  Result Value Ref Range Status   Specimen Description BLOOD LEFT HAND  Final   Special Requests BOTTLES DRAWN AEROBIC AND ANAEROBIC [place]  Final   Culture NO GROWTH 2 DAYS  Final   Report Status PENDING  Incomplete         Radiology Studies: No results found.      Scheduled Meds: . arformoterol  15 mcg Nebulization BID  . aspirin EC  81 mg Oral Daily  .  atorvastatin  10 mg Oral q1800  . calcium-vitamin D  1 tablet Oral Daily  . cefTRIAXone (ROCEPHIN)  IV  1 g Intravenous Q24H  . donepezil  10 mg Oral QHS  . enoxaparin (LOVENOX) injection  40 mg Subcutaneous Q24H  . FLUoxetine  40 mg Oral Daily  . multivitamin with minerals  1 tablet Oral Daily  . pantoprazole  40 mg Oral Daily  . verapamil  240 mg Oral QHS   Continuous Infusions: . 0.9 % NaCl with KCl 20 mEq / L 75 mL/hr at 09/21/16 1826     LOS: 1 day    Time spent: 25 minutes    Kathie Dike, MD [facility] If 7PM-7AM, please contact night-coverage [email or web address] Password [facility] 09/22/2016, 4:56 PM

## 2016-09-23 DIAGNOSIS — N390 Urinary tract infection, site not specified: Secondary | ICD-10-CM

## 2016-09-23 DIAGNOSIS — A419 Sepsis, unspecified organism: Principal | ICD-10-CM

## 2016-09-23 LAB — URINE CULTURE: Culture: >=100000 — AB

## 2016-09-23 LAB — BASIC METABOLIC PANEL
ANION GAP: 5 (ref 5–15)
BUN: 16 mg/dL (ref 6–20)
CALCIUM: 7.9 mg/dL — AB (ref 8.9–10.3)
CHLORIDE: 109 mmol/L (ref 101–111)
CO2: 26 mmol/L (ref 22–32)
CREATININE: 1.01 mg/dL — AB (ref 0.44–1.00)
GFR calc non Af Amer: 53 mL/min — ABNORMAL LOW (ref >60)
Glucose, Bld: 89 mg/dL (ref 65–99)
Potassium: 4.2 mmol/L (ref 3.5–5.1)
SODIUM: 140 mmol/L (ref 135–145)

## 2016-09-23 LAB — CBC
HEMATOCRIT: 27.2 % — AB (ref 36.0–46.0)
HEMOGLOBIN: 8.3 g/dL — AB (ref 12.0–15.0)
MCH: 25.9 pg — ABNORMAL LOW (ref 26.0–34.0)
MCHC: 30.5 g/dL (ref 30.0–36.0)
MCV: 85 fL (ref 78.0–100.0)
Platelets: 215 10*3/uL (ref 150–400)
RBC: 3.2 MIL/uL — ABNORMAL LOW (ref 3.87–5.11)
RDW: 17 % — ABNORMAL HIGH (ref 11.5–15.5)
WBC: 8.8 10*3/uL (ref 4.0–10.5)

## 2016-09-23 LAB — GLUCOSE, CAPILLARY: GLUCOSE-CAPILLARY: 88 mg/dL (ref 65–99)

## 2016-09-23 MED ORDER — CEFDINIR 300 MG PO CAPS: 300.0000 mg | ORAL_CAPSULE | Freq: Two times a day (BID) | ORAL | 0 refills | Status: AC

## 2016-09-23 NOTE — Progress Notes (Signed)
No visit

## 2016-09-23 NOTE — Progress Notes (Signed)
[name] discharged Home per MD order.  Discharge instructions reviewed and discussed with the patient, all questions and concerns answered. Copy of instructions and scripts given to patient.    Medication List    TAKE these medications   acetaminophen 500 MG tablet Commonly known as:  TYLENOL Take 1,000 mg by mouth 2 (two) times daily.   Albuterol Sulfate 108 (90 Base) MCG/ACT Aepb Commonly known as:  PROAIR RESPICLICK Inhale 2 puffs into the lungs every 8 (eight) hours as needed.   alendronate 70 MG tablet Commonly known as:  FOSAMAX Take 70 mg by mouth once a week. Take with a full glass of water on an empty stomach.   arformoterol 15 MCG/2ML Nebu Commonly known as:  BROVANA Take 2 mLs (15 mcg total) by nebulization 2 (two) times daily.   aspirin EC 81 MG tablet Take 81 mg by mouth daily.   atorvastatin 10 MG tablet Commonly known as:  LIPITOR Take 1 tablet (10 mg total) by mouth daily.   calcium-vitamin D 500-200 MG-UNIT tablet Commonly known as:  OSCAL WITH D Take 1 tablet by mouth daily.   cefdinir 300 MG capsule Commonly known as:  OMNICEF Take 1 capsule (300 mg total) by mouth 2 (two) times daily.   CEROVITE SENIOR Tabs Take 1 tablet by mouth daily.   donepezil 10 MG tablet Commonly known as:  ARICEPT Take 1 tablet (10 mg total) by mouth at bedtime.   ENSURE Take 1 Can by mouth daily as needed. Take one by mouth with meals   FLUoxetine 40 MG capsule Commonly known as:  PROZAC Take 1 capsule (40 mg total) by mouth daily.   loratadine 10 MG tablet Commonly known as:  CLARITIN Take 10 mg by mouth daily as needed for allergies.   omeprazole 40 MG capsule Commonly known as:  PRILOSEC Take 1 capsule (40 mg total) by mouth daily.   verapamil 240 MG CR tablet Commonly known as:  CALAN-SR Take 1 tablet (240 mg total) by mouth at bedtime.   Vitamin D (Ergocalciferol) 50000 units Caps capsule Commonly known as:  DRISDOL Take 50,000 Units by  mouth every 7 (seven) days.       Patients skin is clean, dry and intact, no evidence of skin break down. IV site discontinued and catheter remains intact. Site without signs and symptoms of complications. Dressing and pressure applied.  Patient escorted to car by RN in a wheelchair,  no distress noted upon discharge.  [name] [name] 09/23/2016 3:18 PM

## 2016-09-23 NOTE — Care Management Important Message (Signed)
Important Message  Patient Details  Name: [name] MRN: [id number] Date of Birth: [birth year]   Medicare Important Message Given:  Yes    Sherald Barge, RN 09/23/2016, 11:25 AM

## 2016-09-23 NOTE — Discharge Summary (Signed)
Physician Discharge Summary  [name] [id number] DOB: 07/23/1941 DOA: 09/20/2016  PCP: Tula Nakayama, MD  Admit date: 09/20/2016 Discharge date: 09/23/2016  Time spent: > 35 minutes  Recommendations for Outpatient Follow-up:  1. Monitor K levels 2. Ensure patient completes antibiotic course   Discharge Diagnoses:  Principal Problem:   Sepsis secondary to UTI [facility]) Active Problems:   Normocytic anemia   COPD (chronic obstructive pulmonary disease) (HCC)   Obstructive sleep apnea   Essential hypertension   GERD   Memory difficulty   Hypercapnic respiratory failure, chronic (HCC)   UTI (urinary tract infection)   Discharge Condition: stable  Diet recommendation: low sodium diet  Filed Weights   09/20/16 1413 09/20/16 2257  Weight: 100.2 kg (221 lb) 100.8 kg (222 lb 3.6 oz)    History of present illness:  Patient is a 75 year old female with history of chronic lymphocytic anemia, COPD, GERD, hypertension, who presented to the hospital with main complaints of confusion and generalized weakness. Have Urinary Tract Infection.  Hospital Course:  UTI - Improving on ceftriaxone. Grew out Escherichia coli. Sensitive to third-generation cephalosporin. Will discharge on oral pulse third generation cephalosporin for 4 more days to complete a total of 7 days of antibiotic treatment. Patient did have fever on day of discharge was requesting discharge and did not want to stay in the hospital given her improvement in her condition. She did however state that she would follow-up with her primary care physician should any new problems arise and was planning on seeing a physician within the next week.  For other known medical problems listed above will continue home medication regimen listed below  Procedures:  None  Consultations:  None  Discharge Exam: Vitals:   09/22/16 2046 09/23/16 0430  BP: 130/60 134/60  Pulse: (!) 107 88  Resp: 18 20  Temp: (!) 100.4 F  (38 C) 98.3 F (36.8 C)    General: Pt in nad, alert and awake Cardiovascular: rrr, no rubs Respiratory: no increased wob, no wheezes  Discharge Instructions   Discharge Instructions    Call MD for:  severe uncontrolled pain    Complete by:  As directed    Call MD for:  temperature >100.4    Complete by:  As directed    Diet - low sodium heart healthy    Complete by:  As directed    Increase activity slowly    Complete by:  As directed      Current Discharge Medication List    START taking these medications   Details  cefdinir (OMNICEF) 300 MG capsule Take 1 capsule (300 mg total) by mouth 2 (two) times daily. Qty: 8 capsule, Refills: 0      CONTINUE these medications which have NOT CHANGED   Details  acetaminophen (TYLENOL) 500 MG tablet Take 1,000 mg by mouth 2 (two) times daily.     Albuterol Sulfate (PROAIR RESPICLICK) 123XX123 (90 BASE) MCG/ACT AEPB Inhale 2 puffs into the lungs every 8 (eight) hours as needed. Qty: 1 each, Refills: 11    alendronate (FOSAMAX) 70 MG tablet Take 70 mg by mouth once a week. Take with a full glass of water on an empty stomach.    arformoterol (BROVANA) 15 MCG/2ML NEBU Take 2 mLs (15 mcg total) by nebulization 2 (two) times daily. Qty: 360 mL, Refills: 1   Associated Diagnoses: COPD (chronic obstructive pulmonary disease) (HCC)    aspirin EC 81 MG tablet Take 81 mg by mouth daily.  atorvastatin (LIPITOR) 10 MG tablet Take 1 tablet (10 mg total) by mouth daily. Qty: 90 tablet, Refills: 1    calcium-vitamin D (OSCAL WITH D) 500-200 MG-UNIT per tablet Take 1 tablet by mouth daily.     donepezil (ARICEPT) 10 MG tablet Take 1 tablet (10 mg total) by mouth at bedtime. Qty: 90 tablet, Refills: 1   Associated Diagnoses: Memory difficulty    ENSURE (ENSURE) Take 1 Can by mouth daily as needed. Take one by mouth with meals     FLUoxetine (PROZAC) 40 MG capsule Take 1 capsule (40 mg total) by mouth daily. Qty: 90 capsule, Refills: 1     loratadine (CLARITIN) 10 MG tablet Take 10 mg by mouth daily as needed for allergies.     Multiple Vitamins-Minerals (CEROVITE SENIOR) TABS Take 1 tablet by mouth daily.     omeprazole (PRILOSEC) 40 MG capsule Take 1 capsule (40 mg total) by mouth daily. Qty: 90 capsule, Refills: 1    verapamil (CALAN-SR) 240 MG CR tablet Take 1 tablet (240 mg total) by mouth at bedtime. Qty: 90 tablet, Refills: 1    Vitamin D, Ergocalciferol, (DRISDOL) 50000 units CAPS capsule Take 50,000 Units by mouth every 7 (seven) days.       Allergies  Allergen Reactions  . Fenofibrate Nausea And Vomiting  . Pravastatin Sodium Nausea And Vomiting  . Sulfonamide Derivatives Other (See Comments)    unknown  . Ciprofloxacin Rash      The results of significant diagnostics from this hospitalization (including imaging, microbiology, ancillary and laboratory) are listed below for reference.    Significant Diagnostic Studies: Dg Chest 2 View  Result Date: 09/20/2016 CLINICAL DATA:  Nonproductive cough, weakness EXAM: CHEST  2 VIEW COMPARISON:  CTA chest dated 01/15/2016 FINDINGS: Lungs are clear.  No pleural effusion or pneumothorax. The heart is top-normal in size. Degenerative changes of the visualized thoracolumbar spine. IMPRESSION: No evidence of acute cardiopulmonary disease. Electronically Signed   By: Julian Hy M.D.   On: 09/20/2016 14:44   Dg Foot Complete Right  Result Date: 09/13/2016 3 views right foot Third metatarsal fracture follow-up Fractures nondisplaced fracture is good bridging callus fracture has no angulation Healed right third metatarsal fracture   Microbiology: Recent Results (from the past 240 hour(s))  Culture, blood (Routine x 2)     Status: None (Preliminary result)   Collection Time: 09/20/16  2:48 PM  Result Value Ref Range Status   Specimen Description BLOOD BLOOD RIGHT ARM  Final   Special Requests BOTTLES DRAWN AEROBIC AND ANAEROBIC 6 CC EACH  Final   Culture NO  GROWTH 3 DAYS  Final   Report Status PENDING  Incomplete  Urine culture     Status: Abnormal   Collection Time: 09/20/16  3:07 PM  Result Value Ref Range Status   Specimen Description URINE, CLEAN CATCH  Final   Special Requests NONE  Final   Culture >=100,000 COLONIES/mL ESCHERICHIA COLI (A)  Final   Report Status 09/23/2016 FINAL  Final   Organism ID, Bacteria ESCHERICHIA COLI (A)  Final      Susceptibility   Escherichia coli - MIC*    AMPICILLIN >=32 RESISTANT Resistant     CEFAZOLIN <=4 SENSITIVE Sensitive     CEFTRIAXONE <=1 SENSITIVE Sensitive     CIPROFLOXACIN <=0.25 SENSITIVE Sensitive     GENTAMICIN <=1 SENSITIVE Sensitive     IMIPENEM <=0.25 SENSITIVE Sensitive     NITROFURANTOIN <=16 SENSITIVE Sensitive     TRIMETH/SULFA >=320  RESISTANT Resistant     AMPICILLIN/SULBACTAM >=32 RESISTANT Resistant     PIP/TAZO <=4 SENSITIVE Sensitive     Extended ESBL NEGATIVE Sensitive     * >=100,000 COLONIES/mL ESCHERICHIA COLI  Culture, blood (single)     Status: None (Preliminary result)   Collection Time: 09/20/16  9:05 PM  Result Value Ref Range Status   Specimen Description BLOOD LEFT HAND  Final   Special Requests BOTTLES DRAWN AEROBIC AND ANAEROBIC [place] EACH  Final   Culture NO GROWTH 3 DAYS  Final   Report Status PENDING  Incomplete     Labs: Basic Metabolic Panel:  Recent Labs Lab 09/20/16 1447 09/21/16 0606 09/22/16 0548 09/23/16 0600  NA 138 140 141 140  K 3.6 3.4* 4.0 4.2  CL 102 107 109 109  CO2 28 28 28 26   GLUCOSE 105* 93 87 89  BUN 18 20 19 16   CREATININE 1.16* 1.00 0.90 1.01*  CALCIUM 8.9 7.9* 7.8* 7.9*   Liver Function Tests:  Recent Labs Lab 09/20/16 1447  AST 25  ALT 14  ALKPHOS 76  BILITOT 0.7  PROT 7.2  ALBUMIN 3.8   No results for input(s): LIPASE, AMYLASE in the last 168 hours. No results for input(s): AMMONIA in the last 168 hours. CBC:  Recent Labs Lab 09/20/16 1447 09/21/16 0606 09/22/16 0548 09/23/16 0600  WBC 17.9* 17.8*  11.0* 8.8  NEUTROABS 15.8* 14.9*  --   --   HGB 10.6* 9.6* 8.5* 8.3*  HCT 34.5* 31.9* 28.5* 27.2*  MCV 84.4 85.8 85.3 85.0  PLT 235 218 201 215   Cardiac Enzymes:  Recent Labs Lab 09/20/16 1447  TROPONINI <0.03   BNP: BNP (last 3 results)  Recent Labs  01/15/16 2155  BNP 109.0*    ProBNP (last 3 results) No results for input(s): PROBNP in the last 8760 hours.  CBG:  Recent Labs Lab 09/21/16 0729 09/22/16 0726 09/23/16 0736  GLUCAP 87 84 88     Signed:  Velvet Bathe MD.  [facility] 09/23/2016, 1:28 PM

## 2016-09-25 LAB — CULTURE, BLOOD (ROUTINE X 2): CULTURE: NO GROWTH

## 2016-09-25 LAB — CULTURE, BLOOD (SINGLE): CULTURE: NO GROWTH

## 2016-09-29 DIAGNOSIS — J449 Chronic obstructive pulmonary disease, unspecified: Secondary | ICD-10-CM | POA: Diagnosis not present

## 2016-10-12 ENCOUNTER — Encounter: Payer: Self-pay | Admitting: Family Medicine

## 2016-10-12 DIAGNOSIS — J449 Chronic obstructive pulmonary disease, unspecified: Secondary | ICD-10-CM | POA: Diagnosis not present

## 2016-10-19 DIAGNOSIS — J449 Chronic obstructive pulmonary disease, unspecified: Secondary | ICD-10-CM | POA: Diagnosis not present

## 2016-10-20 DIAGNOSIS — M84471D Pathological fracture, right ankle, subsequent encounter for fracture with routine healing: Secondary | ICD-10-CM | POA: Diagnosis not present

## 2016-10-27 ENCOUNTER — Other Ambulatory Visit: Payer: Self-pay | Admitting: Family Medicine

## 2016-10-28 ENCOUNTER — Other Ambulatory Visit: Payer: Self-pay | Admitting: Family Medicine

## 2016-11-10 ENCOUNTER — Ambulatory Visit (INDEPENDENT_AMBULATORY_CARE_PROVIDER_SITE_OTHER): Payer: Medicare PPO

## 2016-11-10 VITALS — BP 118/68 | HR 88 | Temp 98.7°F | Resp 20 | Ht 73.0 in | Wt 213.0 lb

## 2016-11-10 DIAGNOSIS — M810 Age-related osteoporosis without current pathological fracture: Secondary | ICD-10-CM | POA: Diagnosis not present

## 2016-11-10 DIAGNOSIS — Z0000 Encounter for general adult medical examination without abnormal findings: Secondary | ICD-10-CM | POA: Diagnosis not present

## 2016-11-10 DIAGNOSIS — E785 Hyperlipidemia, unspecified: Secondary | ICD-10-CM | POA: Diagnosis not present

## 2016-11-10 DIAGNOSIS — I10 Essential (primary) hypertension: Secondary | ICD-10-CM

## 2016-11-10 NOTE — Progress Notes (Signed)
Subjective:   [name] is a 75 y.o. female who presents for Medicare Annual (Subsequent) preventive examination.  Review of Systems:  Cardiac Risk Factors include: advanced age (>41men, >103 women);dyslipidemia;family history of premature cardiovascular disease;hypertension;sedentary lifestyle;smoking/ tobacco exposure     Objective:     Vitals: BP 118/68   Pulse 88   Temp 98.7 F (37.1 C) (Oral)   Resp 20   Ht 6\' 1"  (1.854 m)   Wt 213 lb 0.6 oz (96.6 kg)   SpO2 92%   BMI 28.11 kg/m   Body mass index is 28.11 kg/m.   Tobacco History  Smoking Status  . Former Smoker  . Packs/day: 1.50  . Years: 40.00  . Types: Cigarettes  . Quit date: 06/21/2006  Smokeless Tobacco  . Never Used     Counseling given: Not Answered   Past Medical History:  Diagnosis Date  . Abnormality of gait 06/06/2014  . Allergic rhinitis   . Anemia   . Ankle fracture, right   . Anxiety   . Arm fracture, left   . Arthritis    abnormal gait   . COPD (chronic obstructive pulmonary disease) (HCC)    chronic CO2 retention, decreased DLCO   . Cystic acne    adult   . 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 Degenerative disc disease, lumbar    L5 nerve impingement   . Depression   . DNR (do not resuscitate)   . GERD (gastroesophageal reflux disease)   . History of colon surgery    right hemicolectomy for high-grade adenomas in right colon 2013  . Hyperglycemia   . Hypertension   . Low back pain   . Mediastinal lymphadenopathy 1/9   resolving   . Memory difficulty 09/20/2014   mild dementia  . Onychomycosis   . OSA on CPAP   . Sleep apnea    stop bang score 6   Past Surgical History:  Procedure Laterality Date  . ABDOMINAL HYSTERECTOMY  1976   secondary to bleeding   . APPENDECTOMY    . COLON RESECTION  08/18/2012   Procedure: HAND ASSISTED LAPAROSCOPIC COLON RESECTION;  Surgeon: Jamesetta So, MD;  Location: [facility];  Service: General;  Laterality: N/A;    . COLONOSCOPY  07/20/2012   Dr. Gala Romney: multiple colonic polyps with large polyps on right side, s/p saline-assisted debulking piecemeal polypectomy and ablation. Not all removed. Path with tubulovillous adenomas, high grade dysplasia.   . COLONOSCOPY N/A 03/09/2013   [id number] polyps-tubular adenomas. S/p right hemicolectomy. next tcs 02/2018  . epidural steroids    . ESOPHAGOGASTRODUODENOSCOPY (EGD) WITH ESOPHAGEAL DILATION N/A 02/06/2014   Procedure: ESOPHAGOGASTRODUODENOSCOPY (EGD) WITH ESOPHAGEAL DILATION;  Surgeon: Daneil Dolin, MD;  Location: [facility];  Service: Endoscopy;  Laterality: N/A;  9:30  . OOPHORECTOMY  1976  . ORIF ANKLE FRACTURE  01/18/2012   Procedure: OPEN REDUCTION INTERNAL FIXATION (ORIF) ANKLE FRACTURE;  Surgeon: Arther Abbott, MD;  Location: [facility];  Service: Orthopedics;  Laterality: Left;  . ORIF ANKLE FRACTURE Right 01/13/2016   Procedure: OPEN REDUCTION INTERNAL FIXATION (ORIF) ANKLE FRACTURE;  Surgeon: Carole Civil, MD;  Location: [facility];  Service: Orthopedics;  Laterality: Right;   Family History  Problem Relation Age of Onset  . Stomach cancer Father   . Cancer Father   . Cancer Mother     brain   . Breast cancer Mother   . Arthritis     History  Sexual Activity  . Sexual activity: No    Outpatient Encounter Prescriptions as of 11/10/2016  Medication Sig  . acetaminophen (TYLENOL) 500 MG tablet Take 1,000 mg by mouth 2 (two) times daily.   . Albuterol Sulfate (PROAIR RESPICLICK) 123XX123 (90 BASE) MCG/ACT AEPB Inhale 2 puffs into the lungs every 8 (eight) hours as needed.  [name] alendronate (FOSAMAX) 70 MG tablet Take 70 mg by mouth once a week. Take with a full glass of water on an empty stomach.  [name] arformoterol (BROVANA) 15 MCG/2ML NEBU Take 2 mLs (15 mcg total) by nebulization 2 (two) times daily.  [name] aspirin EC 81 MG tablet Take 81 mg by mouth daily.  [name] atorvastatin (LIPITOR) 10 MG tablet TAKE 1 TABLET EVERY DAY  . calcium-vitamin D (OSCAL WITH  D) 500-200 MG-UNIT per tablet Take 1 tablet by mouth daily.   [name] donepezil (ARICEPT) 10 MG tablet Take 1 tablet (10 mg total) by mouth at bedtime.  . ENSURE (ENSURE) Take 1 Can by mouth daily as needed. Take one by mouth with meals   . FLUoxetine (PROZAC) 40 MG capsule Take 1 capsule (40 mg total) by mouth daily.  [name] loratadine (CLARITIN) 10 MG tablet Take 10 mg by mouth daily as needed for allergies.   . Multiple Vitamins-Minerals (CEROVITE SENIOR) TABS Take 1 tablet by mouth daily.   [name] omeprazole (PRILOSEC) 40 MG capsule Take 1 capsule (40 mg total) by mouth daily.  . verapamil (CALAN-SR) 240 MG CR tablet TAKE 1 TABLET AT BEDTIME  . Vitamin D, Ergocalciferol, (DRISDOL) 50000 units CAPS capsule Take 50,000 Units by mouth every 7 (seven) days.   No facility-administered encounter medications on file as of 11/10/2016.     Activities of Daily Living In your present state of health, do you have any difficulty performing the following activities: 11/10/2016 09/20/2016  Hearing? N N  Vision? N N  Difficulty concentrating or making decisions? Y N  Walking or climbing stairs? Y Y  Dressing or bathing? N Y  Doing errands, shopping? Y N  Preparing Food and eating ? Y -  Using the Toilet? N -  In the past six months, have you accidently leaked urine? N -  Do you have problems with loss of bowel control? N -  Managing your Medications? Y -  Managing your Finances? Y -  Housekeeping or managing your Housekeeping? Y -  Some recent data might be hidden    Patient Care Team: [name], MD as PCP - General Josue Hector, MD as Attending Physician (Cardiology) Kathrynn Ducking, MD as Consulting Physician (Neurology) Sinda Du, MD as Consulting Physician (Pulmonary Disease) Carole Civil, MD as Consulting Physician (Orthopedic Surgery)    Assessment:    Exercise Activities and Dietary recommendations Current Exercise Habits: The patient does not participate in regular exercise  at present, Exercise limited by: Other - see comments;None identified  Goals    . Exercise 3x per week (30 min per time)          Starting 11/15/2016 patient would like to start chair exercises 3 times a week at 30 minutes a time.      Fall Risk Fall Risk  11/10/2016 12/15/2015 11/14/2014 08/21/2013 08/21/2013  Falls in the past year? Yes Yes Yes Yes Yes  Number falls in past yr: 2 or more 2 or more 2 or more 2 or more 2 or more  Injury with Fall? Yes No - - -  Risk Factor Category  High Fall Risk High Fall Risk High Fall Risk High Fall Risk High Fall Risk  Risk for fall due to : History of fall(s);Impaired balance/gait;Impaired mobility Impaired balance/gait;Mental status change Impaired balance/gait Impaired balance/gait;Impaired mobility;Impaired vision History of fall(s)  Follow up Falls prevention discussed Falls evaluation completed;Falls prevention discussed - - -   Depression Screen PHQ 2/9 Scores 11/10/2016 12/15/2015 11/14/2014 08/21/2013  PHQ - 2 Score 0 0 4 0  PHQ- 9 Score - - 12 -     Cognitive Function MMSE - Mini Mental State Exam 04/21/2016 10/21/2015 09/20/2014  Orientation to time 5 3 4   Orientation to Place 4 3 3   Registration 3 3 3   Attention/ Calculation 4 2 5   Recall 2 0 2  Language- name 2 objects 2 2 2   Language- repeat 1 1 1   Language- follow 3 step command 3 3 3   Language- read & follow direction 1 1 1   Write a sentence 1 1 1   Copy design 1 1 1   Total score 27 20 26      6CIT Screen 11/10/2016  What Year? 0 points  What month? 0 points  What time? 0 points  Count back from 20 0 points  Months in reverse 0 points  Repeat phrase 0 points  Total Score 0    Immunization History  Administered Date(s) Administered  . Influenza Split 08/25/2011, 07/25/2012  . Influenza Whole 09/12/2006, 08/25/2007, 08/01/2008, 08/20/2009, 08/20/2010  . Influenza,inj,Quad PF,36+ Mos 08/21/2013, 09/04/2014, 08/13/2015, 07/08/2016  . Pneumococcal Conjugate-13 06/20/2014  .  Pneumococcal Polysaccharide-23 09/12/2006, 08/20/2010, 04/16/2011  . Td 08/20/2010  . Zoster 12/26/2013   Screening Tests Health Maintenance  Topic Date Due  . COLONOSCOPY  03/09/2018  . TETANUS/TDAP  08/20/2020  . INFLUENZA VACCINE  Completed  . DEXA SCAN  Completed  . ZOSTAVAX  Completed  . PNA vac Low Risk Adult  Completed      Plan:  I have personally reviewed and addressed the Medicare Annual Wellness questionnaire and have noted the following in the patient's chart:  A. Medical and social history B. Use of alcohol, tobacco or illicit drugs  C. Current medications and supplements D. Functional ability and status E.  Nutritional status F.  Physical activity G. Advance directives H. List of other physicians I.  Hospitalizations, surgeries, and ER visits in previous 12 months J.  [place] to include hearing, vision, cognitive, depression L. Referrals and appointments - none  In addition, I have reviewed and discussed with patient certain preventive protocols, quality metrics, and best practice recommendations. A written personalized care plan for preventive services as well as general preventive health recommendations were provided to patient.  Signed,   Stormy Fabian, LPN Lead Nurse Health Advisor

## 2016-11-10 NOTE — Patient Instructions (Addendum)
Health maintenance: Lab work ordered today, please have these done in 3 months prior to your appointment with Dr. Moshe Cipro.   Abnormal screenings: None   Patient concerns: None    Nurse concerns: None   Next PCP appt: In 3 months with Dr. Moshe Cipro     Fall Prevention in the Home Introduction Falls can cause injuries. They can happen to people of all ages. There are many things you can do to make your home safe and to help prevent falls. What can I do on the outside of my home?  Regularly fix the edges of walkways and driveways and fix any cracks.  Remove anything that might make you trip as you walk through a door, such as a raised step or threshold.  Trim any bushes or trees on the path to your home.  Use bright outdoor lighting.  Clear any walking paths of anything that might make someone trip, such as rocks or tools.  Regularly check to see if handrails are loose or broken. Make sure that both sides of any steps have handrails.  Any raised decks and porches should have guardrails on the edges.  Have any leaves, snow, or ice cleared regularly.  Use sand or salt on walking paths during winter.  Clean up any spills in your garage right away. This includes oil or grease spills. What can I do in the bathroom?  Use night lights.  Install grab bars by the toilet and in the tub and shower. Do not use towel bars as grab bars.  Use non-skid mats or decals in the tub or shower.  If you need to sit down in the shower, use a plastic, non-slip stool.  Keep the floor dry. Clean up any water that spills on the floor as soon as it happens.  Remove soap buildup in the tub or shower regularly.  Attach bath mats securely with double-sided non-slip rug tape.  Do not have throw rugs and other things on the floor that can make you trip. What can I do in the bedroom?  Use night lights.  Make sure that you have a light by your bed that is easy to reach.  Do not use any sheets  or blankets that are too big for your bed. They should not hang down onto the floor.  Have a firm chair that has side arms. You can use this for support while you get dressed.  Do not have throw rugs and other things on the floor that can make you trip. What can I do in the kitchen?  Clean up any spills right away.  Avoid walking on wet floors.  Keep items that you use a lot in easy-to-reach places.  If you need to reach something above you, use a strong step stool that has a grab bar.  Keep electrical cords out of the way.  Do not use floor polish or wax that makes floors slippery. If you must use wax, use non-skid floor wax.  Do not have throw rugs and other things on the floor that can make you trip. What can I do with my stairs?  Do not leave any items on the stairs.  Make sure that there are handrails on both sides of the stairs and use them. Fix handrails that are broken or loose. Make sure that handrails are as long as the stairways.  Check any carpeting to make sure that it is firmly attached to the stairs. Fix any carpet that is loose or  worn.  Avoid having throw rugs at the top or bottom of the stairs. If you do have throw rugs, attach them to the floor with carpet tape.  Make sure that you have a light switch at the top of the stairs and the bottom of the stairs. If you do not have them, ask someone to add them for you. What else can I do to help prevent falls?  Wear shoes that:  Do not have high heels.  Have rubber bottoms.  Are comfortable and fit you well.  Are closed at the toe. Do not wear sandals.  If you use a stepladder:  Make sure that it is fully opened. Do not climb a closed stepladder.  Make sure that both sides of the stepladder are locked into place.  Ask someone to hold it for you, if possible.  Clearly mark and make sure that you can see:  Any grab bars or handrails.  First and last steps.  Where the edge of each step is.  Use  tools that help you move around (mobility aids) if they are needed. These include:  Canes.  Walkers.  Scooters.  Crutches.  Turn on the lights when you go into a dark area. Replace any light bulbs as soon as they burn out.  Set up your furniture so you have a clear path. Avoid moving your furniture around.  If any of your floors are uneven, fix them.  If there are any pets around you, be aware of where they are.  Review your medicines with your doctor. Some medicines can make you feel dizzy. This can increase your chance of falling. Ask your doctor what other things that you can do to help prevent falls. This information is not intended to replace advice given to you by your health care provider. Make sure you discuss any questions you have with your health care provider. Document Released: 08/28/2009 Document Revised: 04/08/2016 Document Reviewed: 12/06/2014  2017 [employer]  Health Maintenance, Female Introduction Adopting a healthy lifestyle and getting preventive care can go a long way to promote health and wellness. Talk with your health care provider about what schedule of regular examinations is right for you. This is a good chance for you to check in with your provider about disease prevention and staying healthy. In between checkups, there are plenty of things you can do on your own. Experts have done a lot of research about which lifestyle changes and preventive measures are most likely to keep you healthy. Ask your health care provider for more information. Weight and diet Eat a healthy diet  Be sure to include plenty of vegetables, fruits, low-fat dairy products, and lean protein.  Do not eat a lot of foods high in solid fats, added sugars, or salt.  Get regular exercise. This is one of the most important things you can do for your health.  Most adults should exercise for at least 150 minutes each week. The exercise should increase your heart rate and make you sweat  (moderate-intensity exercise).  Most adults should also do strengthening exercises at least twice a week. This is in addition to the moderate-intensity exercise. Maintain a healthy weight  Body mass index (BMI) is a measurement that can be used to identify possible weight problems. It estimates body fat based on height and weight. Your health care provider can help determine your BMI and help you achieve or maintain a healthy weight.  For females 45 years of age and older:  A  BMI below 18.5 is considered underweight.  A BMI of 18.5 to 24.9 is normal.  A BMI of 25 to 29.9 is considered overweight.  A BMI of 30 and above is considered obese. Watch levels of cholesterol and blood lipids  You should start having your blood tested for lipids and cholesterol at 75 years of age, then have this test every 5 years.  You may need to have your cholesterol levels checked more often if:  Your lipid or cholesterol levels are high.  You are older than 75 years of age.  You are at high risk for heart disease. Cancer screening Lung Cancer  Lung cancer screening is recommended for adults 48-30 years old who are at high risk for lung cancer because of a history of smoking.  A yearly low-dose CT scan of the lungs is recommended for people who:  Currently smoke.  Have quit within the past 15 years.  Have at least a 30-pack-year history of smoking. A pack year is smoking an average of one pack of cigarettes a day for 1 year.  Yearly screening should continue until it has been 15 years since you quit.  Yearly screening should stop if you develop a health problem that would prevent you from having lung cancer treatment. Breast Cancer  Practice breast self-awareness. This means understanding how your breasts normally appear and feel.  It also means doing regular breast self-exams. Let your health care provider know about any changes, no matter how small.  If you are in your 20s or 30s, you  should have a clinical breast exam (CBE) by a health care provider every 1-3 years as part of a regular health exam.  If you are 35 or older, have a CBE every year. Also consider having a breast X-ray (mammogram) every year.  If you have a family history of breast cancer, talk to your health care provider about genetic screening.  If you are at high risk for breast cancer, talk to your health care provider about having an MRI and a mammogram every year.  Breast cancer gene (BRCA) assessment is recommended for women who have family members with BRCA-related cancers. BRCA-related cancers include:  Breast.  Ovarian.  Tubal.  Peritoneal cancers.  Results of the assessment will determine the need for genetic counseling and BRCA1 and BRCA2 testing. Cervical Cancer  Your health care provider may recommend that you be screened regularly for cancer of the pelvic organs (ovaries, uterus, and vagina). This screening involves a pelvic examination, including checking for microscopic changes to the surface of your cervix (Pap test). You may be encouraged to have this screening done every 3 years, beginning at age 14.  For women ages 76-65, health care providers may recommend pelvic exams and Pap testing every 3 years, or they may recommend the Pap and pelvic exam, combined with testing for human papilloma virus (HPV), every 5 years. Some types of HPV increase your risk of cervical cancer. Testing for HPV may also be done on women of any age with unclear Pap test results.  Other health care providers may not recommend any screening for nonpregnant women who are considered low risk for pelvic cancer and who do not have symptoms. Ask your health care provider if a screening pelvic exam is right for you.  If you have had past treatment for cervical cancer or a condition that could lead to cancer, you need Pap tests and screening for cancer for at least 20 years after your treatment. If  Pap tests have been  discontinued, your risk factors (such as having a new sexual partner) need to be reassessed to determine if screening should resume. Some women have medical problems that increase the chance of getting cervical cancer. In these cases, your health care provider may recommend more frequent screening and Pap tests. Colorectal Cancer  This type of cancer can be detected and often prevented.  Routine colorectal cancer screening usually begins at 75 years of age and continues through 75 years of age.  Your health care provider may recommend screening at an earlier age if you have risk factors for colon cancer.  Your health care provider may also recommend using home test kits to check for hidden blood in the stool.  A small camera at the end of a tube can be used to examine your colon directly (sigmoidoscopy or colonoscopy). This is done to check for the earliest forms of colorectal cancer.  Routine screening usually begins at age 37.  Direct examination of the colon should be repeated every 5-10 years through 75 years of age. However, you may need to be screened more often if early forms of precancerous polyps or small growths are found. Skin Cancer  Check your skin from head to toe regularly.  Tell your health care provider about any new moles or changes in moles, especially if there is a change in a mole's shape or color.  Also tell your health care provider if you have a mole that is larger than the size of a pencil eraser.  Always use sunscreen. Apply sunscreen liberally and repeatedly throughout the day.  Protect yourself by wearing long sleeves, pants, a wide-brimmed hat, and sunglasses whenever you are outside. Heart disease, diabetes, and high blood pressure  High blood pressure causes heart disease and increases the risk of stroke. High blood pressure is more likely to develop in:  People who have blood pressure in the high end of the normal range (130-139/85-89 mm Hg).  People  who are overweight or obese.  People who are African American.  If you are 68-10 years of age, have your blood pressure checked every 3-5 years. If you are 31 years of age or older, have your blood pressure checked every year. You should have your blood pressure measured twice-once when you are at a hospital or clinic, and once when you are not at a hospital or clinic. Record the average of the two measurements. To check your blood pressure when you are not at a hospital or clinic, you can use:  An automated blood pressure machine at a pharmacy.  A home blood pressure monitor.  If you are between 51 years and 18 years old, ask your health care provider if you should take aspirin to prevent strokes.  Have regular diabetes screenings. This involves taking a blood sample to check your fasting blood sugar level.  If you are at a normal weight and have a low risk for diabetes, have this test once every three years after 75 years of age.  If you are overweight and have a high risk for diabetes, consider being tested at a younger age or more often. Preventing infection Hepatitis B  If you have a higher risk for hepatitis B, you should be screened for this virus. You are considered at high risk for hepatitis B if:  You were born in a country where hepatitis B is common. Ask your health care provider which countries are considered high risk.  Your parents were born  in a high-risk country, and you have not been immunized against hepatitis B (hepatitis B vaccine).  You have HIV or AIDS.  You use needles to inject street drugs.  You live with someone who has hepatitis B.  You have had sex with someone who has hepatitis B.  You get hemodialysis treatment.  You take certain medicines for conditions, including cancer, organ transplantation, and autoimmune conditions. Hepatitis C  Blood testing is recommended for:  Everyone born from [birth year] through 1965.  Anyone with known risk factors for  hepatitis C. Sexually transmitted infections (STIs)  You should be screened for sexually transmitted infections (STIs) including gonorrhea and chlamydia if:  You are sexually active and are younger than 75 years of age.  You are older than 75 years of age and your health care provider tells you that you are at risk for this type of infection.  Your sexual activity has changed since you were last screened and you are at an increased risk for chlamydia or gonorrhea. Ask your health care provider if you are at risk.  If you do not have HIV, but are at risk, it may be recommended that you take a prescription medicine daily to prevent HIV infection. This is called pre-exposure prophylaxis (PrEP). You are considered at risk if:  You are sexually active and do not regularly use condoms or know the HIV status of your partner(s).  You take drugs by injection.  You are sexually active with a partner who has HIV. Talk with your health care provider about whether you are at high risk of being infected with HIV. If you choose to begin PrEP, you should first be tested for HIV. You should then be tested every 3 months for as long as you are taking PrEP. Pregnancy  If you are premenopausal and you may become pregnant, ask your health care provider about preconception counseling.  If you may become pregnant, take 400 to 800 micrograms (mcg) of folic acid every day.  If you want to prevent pregnancy, talk to your health care provider about birth control (contraception). Osteoporosis and menopause  Osteoporosis is a disease in which the bones lose minerals and strength with aging. This can result in serious bone fractures. Your risk for osteoporosis can be identified using a bone density scan.  If you are 39 years of age or older, or if you are at risk for osteoporosis and fractures, ask your health care provider if you should be screened.  Ask your health care provider whether you should take a calcium  or vitamin D supplement to lower your risk for osteoporosis.  Menopause may have certain physical symptoms and risks.  Hormone replacement therapy may reduce some of these symptoms and risks. Talk to your health care provider about whether hormone replacement therapy is right for you. Follow these instructions at home:  Schedule regular health, dental, and eye exams.  Stay current with your immunizations.  Do not use any tobacco products including cigarettes, chewing tobacco, or electronic cigarettes.  If you are pregnant, do not drink alcohol.  If you are breastfeeding, limit how much and how often you drink alcohol.  Limit alcohol intake to no more than 1 drink per day for nonpregnant women. One drink equals 12 ounces of beer, 5 ounces of wine, or 1 ounces of hard liquor.  Do not use street drugs.  Do not share needles.  Ask your health care provider for help if you need support or information about quitting drugs.  Tell your health care provider if you often feel depressed.  Tell your health care provider if you have ever been abused or do not feel safe at home. This information is not intended to replace advice given to you by your health care provider. Make sure you discuss any questions you have with your health care provider. Document Released: 05/17/2011 Document Revised: 04/08/2016 Document Reviewed: 08/05/2015  2017 [employer]

## 2016-11-11 DIAGNOSIS — J449 Chronic obstructive pulmonary disease, unspecified: Secondary | ICD-10-CM | POA: Diagnosis not present

## 2016-11-11 NOTE — Assessment & Plan Note (Addendum)
Annual exam as documented. Counseling done  re healthy lifestyle involving commitment to 150 minutes exercise per week, heart healthy diet, and attaining healthy weight.The importance of adequate sleep also discussed. Regular seat belt use and home safety, is also discussed.  Immunization and cancer screening needs are specifically addressed at this visit.

## 2016-11-19 DIAGNOSIS — J449 Chronic obstructive pulmonary disease, unspecified: Secondary | ICD-10-CM | POA: Diagnosis not present

## 2016-11-20 DIAGNOSIS — M84471D Pathological fracture, right ankle, subsequent encounter for fracture with routine healing: Secondary | ICD-10-CM | POA: Diagnosis not present

## 2016-12-12 DIAGNOSIS — J449 Chronic obstructive pulmonary disease, unspecified: Secondary | ICD-10-CM | POA: Diagnosis not present

## 2016-12-20 DIAGNOSIS — J449 Chronic obstructive pulmonary disease, unspecified: Secondary | ICD-10-CM | POA: Diagnosis not present

## 2016-12-21 ENCOUNTER — Telehealth: Payer: Self-pay | Admitting: *Deleted

## 2016-12-21 DIAGNOSIS — M84471D Pathological fracture, right ankle, subsequent encounter for fracture with routine healing: Secondary | ICD-10-CM | POA: Diagnosis not present

## 2016-12-21 NOTE — Telephone Encounter (Signed)
Left message on vm for pt to call and reschedule

## 2016-12-21 NOTE — Telephone Encounter (Signed)
Linda left message for patient requesting a call back to reschedule.  Jinny Blossom will be out sick.

## 2016-12-22 ENCOUNTER — Ambulatory Visit: Payer: Medicare PPO | Admitting: Adult Health

## 2016-12-22 DIAGNOSIS — J449 Chronic obstructive pulmonary disease, unspecified: Secondary | ICD-10-CM | POA: Diagnosis not present

## 2017-01-07 ENCOUNTER — Other Ambulatory Visit: Payer: Self-pay | Admitting: Family Medicine

## 2017-01-07 DIAGNOSIS — R413 Other amnesia: Secondary | ICD-10-CM

## 2017-01-12 DIAGNOSIS — J449 Chronic obstructive pulmonary disease, unspecified: Secondary | ICD-10-CM | POA: Diagnosis not present

## 2017-01-28 ENCOUNTER — Emergency Department (HOSPITAL_COMMUNITY): Payer: Medicare PPO

## 2017-01-28 ENCOUNTER — Encounter (HOSPITAL_COMMUNITY): Payer: Self-pay | Admitting: *Deleted

## 2017-01-28 ENCOUNTER — Inpatient Hospital Stay (HOSPITAL_COMMUNITY)
Admission: EM | Admit: 2017-01-28 | Discharge: 2017-02-01 | DRG: 871 | Disposition: A | Discharge status: Home Health | Payer: Medicare PPO | Attending: Family Medicine | Admitting: Family Medicine

## 2017-01-28 DIAGNOSIS — D72829 Elevated white blood cell count, unspecified: Secondary | ICD-10-CM | POA: Diagnosis not present

## 2017-01-28 DIAGNOSIS — B9620 Unspecified Escherichia coli [E. coli] as the cause of diseases classified elsewhere: Secondary | ICD-10-CM | POA: Diagnosis present

## 2017-01-28 DIAGNOSIS — Y92009 Unspecified place in unspecified non-institutional (private) residence as the place of occurrence of the external cause: Secondary | ICD-10-CM

## 2017-01-28 DIAGNOSIS — R6521 Severe sepsis with septic shock: Secondary | ICD-10-CM | POA: Diagnosis present

## 2017-01-28 DIAGNOSIS — S0990XA Unspecified injury of head, initial encounter: Secondary | ICD-10-CM | POA: Diagnosis not present

## 2017-01-28 DIAGNOSIS — Z7982 Long term (current) use of aspirin: Secondary | ICD-10-CM | POA: Diagnosis not present

## 2017-01-28 DIAGNOSIS — R296 Repeated falls: Secondary | ICD-10-CM | POA: Diagnosis present

## 2017-01-28 DIAGNOSIS — I2489 Other forms of acute ischemic heart disease: Secondary | ICD-10-CM

## 2017-01-28 DIAGNOSIS — R7989 Other specified abnormal findings of blood chemistry: Secondary | ICD-10-CM

## 2017-01-28 DIAGNOSIS — Z881 Allergy status to other antibiotic agents status: Secondary | ICD-10-CM

## 2017-01-28 DIAGNOSIS — N179 Acute kidney failure, unspecified: Secondary | ICD-10-CM | POA: Diagnosis present

## 2017-01-28 DIAGNOSIS — Z888 Allergy status to other drugs, medicaments and biological substances status: Secondary | ICD-10-CM

## 2017-01-28 DIAGNOSIS — I517 Cardiomegaly: Secondary | ICD-10-CM | POA: Diagnosis present

## 2017-01-28 DIAGNOSIS — T796XXA Traumatic ischemia of muscle, initial encounter: Secondary | ICD-10-CM | POA: Diagnosis not present

## 2017-01-28 DIAGNOSIS — N390 Urinary tract infection, site not specified: Secondary | ICD-10-CM | POA: Diagnosis not present

## 2017-01-28 DIAGNOSIS — I10 Essential (primary) hypertension: Secondary | ICD-10-CM | POA: Diagnosis not present

## 2017-01-28 DIAGNOSIS — N3000 Acute cystitis without hematuria: Secondary | ICD-10-CM | POA: Diagnosis not present

## 2017-01-28 DIAGNOSIS — F0390 Unspecified dementia without behavioral disturbance: Secondary | ICD-10-CM | POA: Diagnosis not present

## 2017-01-28 DIAGNOSIS — Z882 Allergy status to sulfonamides status: Secondary | ICD-10-CM | POA: Diagnosis not present

## 2017-01-28 DIAGNOSIS — Z90710 Acquired absence of both cervix and uterus: Secondary | ICD-10-CM | POA: Diagnosis not present

## 2017-01-28 DIAGNOSIS — W19XXXA Unspecified fall, initial encounter: Secondary | ICD-10-CM

## 2017-01-28 DIAGNOSIS — M6282 Rhabdomyolysis: Secondary | ICD-10-CM

## 2017-01-28 DIAGNOSIS — I248 Other forms of acute ischemic heart disease: Secondary | ICD-10-CM | POA: Diagnosis not present

## 2017-01-28 DIAGNOSIS — J9621 Acute and chronic respiratory failure with hypoxia: Secondary | ICD-10-CM | POA: Diagnosis present

## 2017-01-28 DIAGNOSIS — G4733 Obstructive sleep apnea (adult) (pediatric): Secondary | ICD-10-CM | POA: Diagnosis present

## 2017-01-28 DIAGNOSIS — K219 Gastro-esophageal reflux disease without esophagitis: Secondary | ICD-10-CM | POA: Diagnosis present

## 2017-01-28 DIAGNOSIS — R531 Weakness: Secondary | ICD-10-CM | POA: Diagnosis not present

## 2017-01-28 DIAGNOSIS — J441 Chronic obstructive pulmonary disease with (acute) exacerbation: Secondary | ICD-10-CM | POA: Diagnosis present

## 2017-01-28 DIAGNOSIS — I959 Hypotension, unspecified: Secondary | ICD-10-CM | POA: Diagnosis present

## 2017-01-28 DIAGNOSIS — I119 Hypertensive heart disease without heart failure: Secondary | ICD-10-CM | POA: Diagnosis not present

## 2017-01-28 DIAGNOSIS — R7881 Bacteremia: Secondary | ICD-10-CM

## 2017-01-28 DIAGNOSIS — Z9181 History of falling: Secondary | ICD-10-CM | POA: Diagnosis not present

## 2017-01-28 DIAGNOSIS — Z87891 Personal history of nicotine dependence: Secondary | ICD-10-CM

## 2017-01-28 DIAGNOSIS — Z66 Do not resuscitate: Secondary | ICD-10-CM | POA: Diagnosis present

## 2017-01-28 DIAGNOSIS — R778 Other specified abnormalities of plasma proteins: Secondary | ICD-10-CM

## 2017-01-28 DIAGNOSIS — A4151 Sepsis due to Escherichia coli [E. coli]: Principal | ICD-10-CM

## 2017-01-28 DIAGNOSIS — R748 Abnormal levels of other serum enzymes: Secondary | ICD-10-CM

## 2017-01-28 DIAGNOSIS — A419 Sepsis, unspecified organism: Secondary | ICD-10-CM | POA: Diagnosis not present

## 2017-01-28 DIAGNOSIS — W1830XA Fall on same level, unspecified, initial encounter: Secondary | ICD-10-CM | POA: Diagnosis present

## 2017-01-28 DIAGNOSIS — E876 Hypokalemia: Secondary | ICD-10-CM | POA: Diagnosis present

## 2017-01-28 DIAGNOSIS — R509 Fever, unspecified: Secondary | ICD-10-CM | POA: Diagnosis present

## 2017-01-28 DIAGNOSIS — R404 Transient alteration of awareness: Secondary | ICD-10-CM | POA: Diagnosis not present

## 2017-01-28 DIAGNOSIS — Z7983 Long term (current) use of bisphosphonates: Secondary | ICD-10-CM | POA: Diagnosis not present

## 2017-01-28 DIAGNOSIS — N3001 Acute cystitis with hematuria: Secondary | ICD-10-CM | POA: Diagnosis not present

## 2017-01-28 LAB — COMPREHENSIVE METABOLIC PANEL
ALBUMIN: 3 g/dL — AB (ref 3.5–5.0)
ALK PHOS: 51 U/L (ref 38–126)
ALT: 21 U/L (ref 14–54)
AST: 50 U/L — AB (ref 15–41)
Anion gap: 7 (ref 5–15)
BUN: 23 mg/dL — ABNORMAL HIGH (ref 6–20)
CALCIUM: 7.9 mg/dL — AB (ref 8.9–10.3)
CHLORIDE: 111 mmol/L (ref 101–111)
CO2: 25 mmol/L (ref 22–32)
CREATININE: 1.89 mg/dL — AB (ref 0.44–1.00)
GFR calc Af Amer: 29 mL/min — ABNORMAL LOW (ref >60)
GFR calc non Af Amer: 25 mL/min — ABNORMAL LOW (ref >60)
GLUCOSE: 110 mg/dL — AB (ref 65–99)
Potassium: 3 mmol/L — ABNORMAL LOW (ref 3.5–5.1)
SODIUM: 143 mmol/L (ref 135–145)
Total Bilirubin: 0.6 mg/dL (ref 0.3–1.2)
Total Protein: 5.7 g/dL — ABNORMAL LOW (ref 6.5–8.1)

## 2017-01-28 LAB — CBC WITH DIFFERENTIAL/PLATELET
BASOS PCT: 0 %
Basophils Absolute: 0 10*3/uL (ref 0.0–0.1)
Eosinophils Absolute: 0 10*3/uL (ref 0.0–0.7)
Eosinophils Relative: 0 %
HEMATOCRIT: 33.8 % — AB (ref 36.0–46.0)
HEMOGLOBIN: 10.6 g/dL — AB (ref 12.0–15.0)
LYMPHS PCT: 4 %
Lymphs Abs: 1 10*3/uL (ref 0.7–4.0)
MCH: 26.2 pg (ref 26.0–34.0)
MCHC: 31.4 g/dL (ref 30.0–36.0)
MCV: 83.7 fL (ref 78.0–100.0)
MONOS PCT: 5 %
Monocytes Absolute: 1.3 10*3/uL — ABNORMAL HIGH (ref 0.1–1.0)
NEUTROS PCT: 91 %
Neutro Abs: 23 10*3/uL — ABNORMAL HIGH (ref 1.7–7.7)
PLATELETS: 281 10*3/uL (ref 150–400)
RBC: 4.04 MIL/uL (ref 3.87–5.11)
RDW: 15.7 % — ABNORMAL HIGH (ref 11.5–15.5)
WBC: 25.3 10*3/uL — AB (ref 4.0–10.5)

## 2017-01-28 LAB — URINALYSIS, MICROSCOPIC (REFLEX): Squamous Epithelial / LPF: NONE SEEN

## 2017-01-28 LAB — URINALYSIS, ROUTINE W REFLEX MICROSCOPIC
Bilirubin Urine: NEGATIVE
GLUCOSE, UA: NEGATIVE mg/dL
Nitrite: NEGATIVE
PH: 5.5 (ref 5.0–8.0)
PROTEIN: 100 mg/dL — AB

## 2017-01-28 LAB — CK: CK TOTAL: 2218 U/L — AB (ref 38–234)

## 2017-01-28 LAB — I-STAT CG4 LACTIC ACID, ED
LACTIC ACID, VENOUS: 2.95 mmol/L — AB (ref 0.5–1.9)
LACTIC ACID, VENOUS: 4.86 mmol/L — AB (ref 0.5–1.9)

## 2017-01-28 LAB — LACTIC ACID, PLASMA
LACTIC ACID, VENOUS: 2.2 mmol/L — AB (ref 0.5–1.9)
LACTIC ACID, VENOUS: 2.4 mmol/L — AB (ref 0.5–1.9)

## 2017-01-28 LAB — TROPONIN I: Troponin I: 0.11 ng/mL (ref <0.03)

## 2017-01-28 LAB — MRSA PCR SCREENING: MRSA by PCR: NEGATIVE

## 2017-01-28 MED ORDER — DEXTROSE 5 % IV SOLN
2.0000 g | INTRAVENOUS | Status: DC
  Administered 2017-01-29 – 2017-02-01 (×4): 2 g via INTRAVENOUS
  Filled 2017-01-28 (×5): qty 2

## 2017-01-28 MED ORDER — ASPIRIN EC 81 MG PO TBEC
81.0000 mg | DELAYED_RELEASE_TABLET | Freq: Every day | ORAL | Status: DC
  Administered 2017-01-28 – 2017-02-01 (×5): 81 mg via ORAL
  Filled 2017-01-28 (×5): qty 1

## 2017-01-28 MED ORDER — SODIUM CHLORIDE 0.9% FLUSH
3.0000 mL | Freq: Two times a day (BID) | INTRAVENOUS | Status: DC
  Administered 2017-01-28 – 2017-02-01 (×9): 3 mL via INTRAVENOUS

## 2017-01-28 MED ORDER — ARFORMOTEROL TARTRATE 15 MCG/2ML IN NEBU
15.0000 ug | INHALATION_SOLUTION | Freq: Two times a day (BID) | RESPIRATORY_TRACT | Status: DC
  Administered 2017-01-28 – 2017-02-01 (×8): 15 ug via RESPIRATORY_TRACT
  Filled 2017-01-28 (×8): qty 2

## 2017-01-28 MED ORDER — FLUOXETINE HCL 20 MG PO CAPS
40.0000 mg | ORAL_CAPSULE | Freq: Every day | ORAL | Status: DC
  Administered 2017-01-28 – 2017-02-01 (×5): 40 mg via ORAL
  Filled 2017-01-28 (×5): qty 2

## 2017-01-28 MED ORDER — LORAZEPAM 2 MG/ML IJ SOLN
0.5000 mg | Freq: Once | INTRAMUSCULAR | Status: AC
  Administered 2017-01-28: 0.5 mg via INTRAVENOUS
  Filled 2017-01-28: qty 1

## 2017-01-28 MED ORDER — ACETAMINOPHEN 650 MG RE SUPP: 650.0000 mg | Freq: Four times a day (QID) | RECTAL | Status: DC | PRN

## 2017-01-28 MED ORDER — SODIUM CHLORIDE 0.9 % IV SOLN
INTRAVENOUS | Status: DC
  Administered 2017-01-28 – 2017-01-30 (×7): via INTRAVENOUS

## 2017-01-28 MED ORDER — SODIUM CHLORIDE 0.9 % IV BOLUS (SEPSIS)
500.0000 mL | Freq: Once | INTRAVENOUS | Status: AC
  Administered 2017-01-28: 500 mL via INTRAVENOUS

## 2017-01-28 MED ORDER — POTASSIUM CHLORIDE CRYS ER 20 MEQ PO TBCR
40.0000 meq | EXTENDED_RELEASE_TABLET | Freq: Once | ORAL | Status: AC
  Administered 2017-01-28: 40 meq via ORAL
  Filled 2017-01-28: qty 2

## 2017-01-28 MED ORDER — SODIUM CHLORIDE 0.9 % IV BOLUS (SEPSIS)
1000.0000 mL | Freq: Once | INTRAVENOUS | Status: AC
  Administered 2017-01-28: 1000 mL via INTRAVENOUS

## 2017-01-28 MED ORDER — ACETAMINOPHEN 325 MG PO TABS
650.0000 mg | ORAL_TABLET | Freq: Four times a day (QID) | ORAL | Status: DC | PRN
  Administered 2017-01-28 – 2017-01-29 (×3): 650 mg via ORAL
  Filled 2017-01-28 (×3): qty 2

## 2017-01-28 MED ORDER — ACETAMINOPHEN 325 MG PO TABS
650.0000 mg | ORAL_TABLET | Freq: Once | ORAL | Status: AC
  Administered 2017-01-28: 650 mg via ORAL
  Filled 2017-01-28: qty 2

## 2017-01-28 MED ORDER — SODIUM CHLORIDE 0.9 % IV SOLN
INTRAVENOUS | Status: DC
  Administered 2017-01-28: 11:00:00 via INTRAVENOUS

## 2017-01-28 MED ORDER — DEXTROSE 5 % IV SOLN
2.0000 g | Freq: Once | INTRAVENOUS | Status: AC
  Administered 2017-01-28: 2 g via INTRAVENOUS
  Filled 2017-01-28: qty 2

## 2017-01-28 MED ORDER — PANTOPRAZOLE SODIUM 40 MG PO TBEC
40.0000 mg | DELAYED_RELEASE_TABLET | Freq: Every day | ORAL | Status: DC
  Administered 2017-01-28 – 2017-02-01 (×5): 40 mg via ORAL
  Filled 2017-01-28 (×5): qty 1

## 2017-01-28 MED ORDER — ONDANSETRON HCL 4 MG PO TABS: 4.0000 mg | ORAL_TABLET | Freq: Four times a day (QID) | ORAL | Status: DC | PRN

## 2017-01-28 MED ORDER — HEPARIN SODIUM (PORCINE) 5000 UNIT/ML IJ SOLN
5000.0000 [IU] | Freq: Three times a day (TID) | INTRAMUSCULAR | Status: DC
  Administered 2017-01-28 – 2017-01-29 (×3): 5000 [IU] via SUBCUTANEOUS
  Filled 2017-01-28 (×3): qty 1

## 2017-01-28 MED ORDER — DEXTROSE 5 % IV SOLN: 1.0000 g | Freq: Once | INTRAVENOUS | Status: DC

## 2017-01-28 MED ORDER — ONDANSETRON HCL 4 MG/2ML IJ SOLN: 4.0000 mg | Freq: Four times a day (QID) | INTRAMUSCULAR | Status: DC | PRN

## 2017-01-28 NOTE — Progress Notes (Signed)
Critical Lactic Acid called to Dr. Sarajane Jews. No new orders at this time.

## 2017-01-28 NOTE — ED Provider Notes (Signed)
[facility] DEPT Provider Note   CSN: [id number] Arrival date & time: 01/28/17  5784     History   Chief Complaint Chief Complaint  Patient presents with  . Fall    HPI [name] is a 76 y.o. female.  The history is provided by the patient, the EMS personnel and a relative. The history is limited by the condition of the patient (Hx dementia).  Fall   Pt was seen at [facility]. Per EMS and pt's family report: EMS called out for fall. Found pt hypotensive with SBP "100." Pt's family states they believe pt may have fallen around 0500/0530, since they were notified pt called for help on her lifeline around 0600. Pt has significant hx of dementia and currently denies any complaints.   Past Medical History:  Diagnosis Date  . Abnormality of gait 06/06/2014  . Allergic rhinitis   . Anemia   . Ankle fracture, right   . Anxiety   . Arm fracture, left   . Arthritis    abnormal gait   . COPD (chronic obstructive pulmonary disease) (HCC)    chronic CO2 retention, decreased DLCO   . Cystic acne    adult   . 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 Degenerative disc disease, lumbar    L5 nerve impingement   . Depression   . DNR (do not resuscitate)   . GERD (gastroesophageal reflux disease)   . History of colon surgery    right hemicolectomy for high-grade adenomas in right colon 2013  . Hyperglycemia   . Hypertension   . Low back pain   . Mediastinal lymphadenopathy 1/9   resolving   . Memory difficulty 09/20/2014   mild dementia  . Onychomycosis   . OSA on CPAP   . Sleep apnea    stop bang score 6    Patient Active Problem List   Diagnosis Date Noted  . Hypercapnic respiratory failure, chronic ([place]) 01/16/2016  . Bimalleolar ankle fracture 01/13/2016  . Medicare annual wellness visit, subsequent 08/13/2015  . Memory difficulty 09/20/2014  . Abnormality of gait 06/06/2014  . Cervical neck pain with evidence of disc disease 05/20/2014  . Multiple  adenomatous polyps 02/15/2013  . Spinal stenosis 09/29/2011  . Falls frequently 09/01/2011  . ALLERGIC RHINITIS, SEASONAL 04/10/2010  . Hyperlipidemia LDL goal <100 01/25/2007  . Normocytic anemia 10/05/2006  . COPD (chronic obstructive pulmonary disease) ([place]) 10/05/2006  . Obstructive sleep apnea 10/05/2006  . Essential hypertension 10/05/2006  . GERD 10/05/2006  . Generalized osteoarthritis of multiple sites 10/05/2006    Past Surgical History:  Procedure Laterality Date  . ABDOMINAL HYSTERECTOMY  1976   secondary to bleeding   . APPENDECTOMY    . COLON RESECTION  08/18/2012   Procedure: HAND ASSISTED LAPAROSCOPIC COLON RESECTION;  Surgeon: Jamesetta So, MD;  Location: [facility] ORS;  Service: General;  Laterality: N/A;  . COLONOSCOPY  07/20/2012   Dr. Gala Romney: multiple colonic polyps with large polyps on right side, s/p saline-assisted debulking piecemeal polypectomy and ablation. Not all removed. Path with tubulovillous adenomas, high grade dysplasia.   . COLONOSCOPY N/A 03/09/2013   ONG:EXBMWUX polyps-tubular adenomas. S/p right hemicolectomy. next tcs 02/2018  . epidural steroids    . ESOPHAGOGASTRODUODENOSCOPY (EGD) WITH ESOPHAGEAL DILATION N/A 02/06/2014   Procedure: ESOPHAGOGASTRODUODENOSCOPY (EGD) WITH ESOPHAGEAL DILATION;  Surgeon: Daneil Dolin, MD;  Location: [facility] SUITE;  Service: Endoscopy;  Laterality: N/A;  9:30  . OOPHORECTOMY  1976  .  ORIF ANKLE FRACTURE  01/18/2012   Procedure: OPEN REDUCTION INTERNAL FIXATION (ORIF) ANKLE FRACTURE;  Surgeon: Arther Abbott, MD;  Location: [facility];  Service: Orthopedics;  Laterality: Left;  . ORIF ANKLE FRACTURE Right 01/13/2016   Procedure: OPEN REDUCTION INTERNAL FIXATION (ORIF) ANKLE FRACTURE;  Surgeon: Carole Civil, MD;  Location: [facility];  Service: Orthopedics;  Laterality: Right;    OB History    Gravida Para Term Preterm AB Living   5 4 4   1      SAB TAB Ectopic Multiple Live Births   1               Home Medications      Prior to Admission medications   Medication Sig Start Date End Date Taking? Authorizing Provider  acetaminophen (TYLENOL) 500 MG tablet Take 1,000 mg by mouth 2 (two) times daily.     Historical Provider, MD  Albuterol Sulfate (PROAIR RESPICLICK) 373 (90 BASE) MCG/ACT AEPB Inhale 2 puffs into the lungs every 8 (eight) hours as needed. 11/11/15   [name], MD  alendronate (FOSAMAX) 70 MG tablet Take 70 mg by mouth once a week. Take with a full glass of water on an empty stomach.    Historical Provider, MD  arformoterol (BROVANA) 15 MCG/2ML NEBU Take 2 mLs (15 mcg total) by nebulization 2 (two) times daily. 04/25/12   [name], MD  aspirin EC 81 MG tablet Take 81 mg by mouth daily.    Historical Provider, MD  atorvastatin (LIPITOR) 10 MG tablet TAKE 1 TABLET EVERY DAY 10/28/16   [name], MD  calcium-vitamin D (OSCAL WITH D) 500-200 MG-UNIT per tablet Take 1 tablet by mouth daily.     Historical Provider, MD  donepezil (ARICEPT) 10 MG tablet TAKE 1 TABLET AT BEDTIME  (NOTE: DOSE INCREASE AS OF 08/12/15) 01/07/17   [name], MD  ENSURE (ENSURE) Take 1 Can by mouth daily as needed. Take one by mouth with meals     Historical Provider, MD  FLUoxetine (PROZAC) 40 MG capsule TAKE 1 CAPSULE EVERY DAY 01/07/17   [name], MD  loratadine (CLARITIN) 10 MG tablet Take 10 mg by mouth daily as needed for allergies.     Historical Provider, MD  Multiple Vitamins-Minerals (CEROVITE SENIOR) TABS Take 1 tablet by mouth daily.     Historical Provider, MD  omeprazole (PRILOSEC) 40 MG capsule TAKE 1 CAPSULE EVERY DAY 01/07/17   [name], MD  verapamil (CALAN-SR) 240 MG CR tablet TAKE 1 TABLET AT BEDTIME 10/29/16   [name], MD  Vitamin D, Ergocalciferol, (DRISDOL) 50000 units CAPS capsule Take 50,000 Units by mouth every 7 (seven) days.    Historical Provider, MD    Family History Family History  Problem Relation Age of Onset  . Stomach cancer  Father   . Cancer Father   . Cancer Mother     brain   . Breast cancer Mother   . Arthritis      Social History Social History  Substance Use Topics  . Smoking status: Former Smoker    Packs/day: 1.50    Years: 40.00    Types: Cigarettes    Quit date: 06/21/2006  . Smokeless tobacco: Never Used  . Alcohol use No     Allergies   Fenofibrate; Pravastatin sodium; Sulfonamide derivatives; and Ciprofloxacin   Review of Systems Review of Systems  Unable to perform ROS: Dementia     Physical Exam Updated  Vital Signs BP (!) 75/59 (BP Location: Left Arm)   Pulse 90   Temp 97.7 F (36.5 C) (Oral)   Resp 16   Ht 6\' 1"  (1.854 m)   Wt 200 lb (90.7 kg)   SpO2 98%   BMI 26.39 kg/m    08:19 Orthostatic Vital Signs CS  Orthostatic Lying   BP- Lying: 100/68  Pulse- Lying: 89      Orthostatic Sitting  BP- Sitting: 116/79  Pulse- Sitting: 93      Orthostatic Standing at 0 minutes  BP- Standing at 0 minutes: 112/65  Pulse- Standing at 0 minutes: 95     Patient Vitals for the past 24 hrs:  BP Temp Temp src Pulse Resp SpO2 Height Weight  01/28/17 0930 119/65 - - 83 14 96 % - -  01/28/17 0900 121/73 - - 86 18 96 % - -  01/28/17 0830 116/70 - - 84 12 98 % - -  01/28/17 0813 100/68 - - 93 14 99 % - -  01/28/17 0813 - (!) 96.9 F (36.1 C) Rectal - - - - -  01/28/17 0703 (!) 75/59 97.7 F (36.5 C) Oral 90 16 98 % - -  01/28/17 0700 (!) 75/59 - - 88 15 98 % - -  01/28/17 0654 - - - - - - 6\' 1"  (1.854 m) 200 lb (90.7 kg)      Physical Exam 0720: Physical examination:  Nursing notes reviewed; Vital signs and O2 SAT reviewed;  Constitutional: Well developed, Well nourished, In no acute distress; Head:  Normocephalic, atraumatic; Eyes: EOMI, PERRL, No scleral icterus; ENMT: Mouth and pharynx normal, Mucous membranes dry; Neck: Supple, Full range of motion, No lymphadenopathy; Cardiovascular: Regular rate and rhythm, No gallop; Respiratory: Breath sounds clear & equal  bilaterally, No wheezes.  Speaking full sentences with ease, Normal respiratory effort/excursion; Chest: Nontender, Movement normal; Abdomen: Soft, Nontender, Nondistended, Normal bowel sounds; Genitourinary: No CVA tenderness; Spine:  No midline CS, TS, LS tenderness.;; Extremities: Pulses normal, No tenderness, Pelvis stable. No edema, No calf edema or asymmetry.; Neuro: Awake, alert, confused per hx dementia. No facial droop. Speech clear. Grips equal. Strength 5/5 equal bilat UE's and LE's. Moves all extremities spontaneously and to command without apparent gross focal motor deficits.; Skin: Color normal, Warm, Dry.   ED Treatments / Results  Labs (all labs ordered are listed, but only abnormal results are displayed)   EKG  EKG Interpretation  Date/Time:  Friday January 28 2017 06:59:06 EDT Ventricular Rate:  90 PR Interval:    QRS Duration: 97 QT Interval:  372 QTC Calculation: 456 R Axis:     Text Interpretation:  Sinus rhythm Borderline low voltage, extremity leads Nonspecific T abnormalities, anterior leads Nonspecific T wave abnormality similar to previous Confirmed by Wyvonnia Dusky  MD, STEPHEN [phone]) on 01/28/2017 7:15:09 AM       Radiology   Procedures Procedures (including critical care time)  Medications Ordered in ED Medications  sodium chloride 0.9 % bolus 1,000 mL (not administered)  0.9 %  sodium chloride infusion (not administered)     Initial Impression / Assessment and Plan / ED Course  I have reviewed the triage vital signs and the nursing notes.  Pertinent labs & imaging results that were available during my care of the patient were reviewed by me and considered in my medical decision making (see chart for details).  MDM Reviewed: previous chart, nursing note and vitals Reviewed previous: labs and ECG Interpretation: labs, ECG, x-ray  and CT scan Total time providing critical care: 30-74 minutes. This excludes time spent performing separately reportable  procedures and services. Consults: admitting MD   CRITICAL CARE Performed by: Alfonzo Feller Total critical care time: 45 minutes Critical care time was exclusive of separately billable procedures and treating other patients. Critical care was necessary to treat or prevent imminent or life-threatening deterioration. Critical care was time spent personally by me on the following activities: development of treatment plan with patient and/or surrogate as well as nursing, discussions with consultants, evaluation of patient's response to treatment, examination of patient, obtaining history from patient or surrogate, ordering and performing treatments and interventions, ordering and review of laboratory studies, ordering and review of radiographic studies, pulse oximetry and re-evaluation of patient's condition.   Results for orders placed or performed during the hospital encounter of 01/28/17  Blood culture (routine x 2)  Result Value Ref Range   Specimen Description BLOOD BOTTLES DRAWN AEROBIC AND ANAEROBIC    Special Requests RIGHT ANTECUBITAL 8CC EACH    Culture NO GROWTH < 12 HOURS    Report Status PENDING   Blood culture (routine x 2)  Result Value Ref Range   Specimen Description BLOOD BOTTLES DRAWN AEROBIC AND ANAEROBIC    Special Requests RIGHT ANTECUBITAL 8 CC EACH    Culture NO GROWTH < 12 HOURS    Report Status PENDING   CBC with Differential/Platelet  Result Value Ref Range   WBC 25.3 (H) 4.0 - 10.5 K/uL   RBC 4.04 3.87 - 5.11 MIL/uL   Hemoglobin 10.6 (L) 12.0 - 15.0 g/dL   HCT 33.8 (L) 36.0 - 46.0 %   MCV 83.7 78.0 - 100.0 fL   MCH 26.2 26.0 - 34.0 pg   MCHC 31.4 30.0 - 36.0 g/dL   RDW 15.7 (H) 11.5 - 15.5 %   Platelets 281 150 - 400 K/uL   Neutrophils Relative % 91 %   Lymphocytes Relative 4 %   Monocytes Relative 5 %   Eosinophils Relative 0 %   Basophils Relative 0 %   Neutro Abs 23.0 (H) 1.7 - 7.7 K/uL   Lymphs Abs 1.0 0.7 - 4.0 K/uL   Monocytes Absolute 1.3  (H) 0.1 - 1.0 K/uL   Eosinophils Absolute 0.0 0.0 - 0.7 K/uL   Basophils Absolute 0.0 0.0 - 0.1 K/uL   WBC Morphology WHITE COUNT CONFIRMED ON SMEAR   Urinalysis, Routine w reflex microscopic  Result Value Ref Range   Color, Urine YELLOW YELLOW   APPearance CLOUDY (A) CLEAR   Specific Gravity, Urine >1.030 (H) 1.005 - 1.030   pH 5.5 5.0 - 8.0   Glucose, UA NEGATIVE NEGATIVE mg/dL   Hgb urine dipstick LARGE (A) NEGATIVE   Bilirubin Urine NEGATIVE NEGATIVE   Ketones, ur TRACE (A) NEGATIVE mg/dL   Protein, ur 100 (A) NEGATIVE mg/dL   Nitrite NEGATIVE NEGATIVE   Leukocytes, UA MODERATE (A) NEGATIVE  Urinalysis, Microscopic (reflex)  Result Value Ref Range   RBC / HPF TOO NUMEROUS TO COUNT 0 - 5 RBC/hpf   WBC, UA TOO NUMEROUS TO COUNT 0 - 5 WBC/hpf   Bacteria, UA MANY (A) NONE SEEN   Squamous Epithelial / LPF NONE SEEN NONE SEEN  CK  Result Value Ref Range   Total CK 2,218 (H) 38 - 234 U/L  Comprehensive metabolic panel  Result Value Ref Range   Sodium 143 135 - 145 mmol/L   Potassium 3.0 (L) 3.5 - 5.1 mmol/L   Chloride 111 101 -  111 mmol/L   CO2 25 22 - 32 mmol/L   Glucose, Bld 110 (H) 65 - 99 mg/dL   BUN 23 (H) 6 - 20 mg/dL   Creatinine, Ser 1.89 (H) 0.44 - 1.00 mg/dL   Calcium 7.9 (L) 8.9 - 10.3 mg/dL   Total Protein 5.7 (L) 6.5 - 8.1 g/dL   Albumin 3.0 (L) 3.5 - 5.0 g/dL   AST 50 (H) 15 - 41 U/L   ALT 21 14 - 54 U/L   Alkaline Phosphatase 51 38 - 126 U/L   Total Bilirubin 0.6 0.3 - 1.2 mg/dL   GFR calc non Af Amer 25 (L) >60 mL/min   GFR calc Af Amer 29 (L) >60 mL/min   Anion gap 7 5 - 15  Troponin I  Result Value Ref Range   Troponin I 0.11 (HH) <0.03 ng/mL  I-Stat CG4 Lactic Acid, ED  Result Value Ref Range   Lactic Acid, Venous 2.95 (HH) 0.5 - 1.9 mmol/L   Comment NOTIFIED PHYSICIAN    Ct Head Wo Contrast Result Date: 01/28/2017 CLINICAL DATA:  Unwitnessed fall EXAM: CT HEAD WITHOUT CONTRAST TECHNIQUE: Contiguous axial images were obtained from the base of the  skull through the vertex without intravenous contrast. COMPARISON:  June 22, 2014 FINDINGS: Brain: Mild diffuse atrophy is stable. There is no intracranial mass, hemorrhage, extra-axial fluid collection, or midline shift. Gray-white compartments are normal. No evident acute infarct. Vascular: No hyperdense vessels. There are scattered foci of calcification in each carotid siphon region. Skull: The bony calvarium appears intact. Sinuses/Orbits: There are areas of ethmoid sinus opacification bilaterally. There is also mild mucosal thickening in several ethmoid air cells. Other visualized paranasal sinuses are clear. Orbits appear symmetric bilaterally. Other: Visualized mastoid air cells are clear. IMPRESSION: Mild atrophy. No intracranial mass, hemorrhage, or extra-axial fluid collection. Gray-white compartments appear unremarkable. Foci of atherosclerotic calcification noted bilaterally. There are areas of ethmoid sinus disease bilaterally. Electronically Signed   By: Lowella Grip III M.D.   On: 01/28/2017 08:55   Dg Chest Portable 1 View Result Date: 01/28/2017 CLINICAL DATA:  Altered mental status. EXAM: PORTABLE CHEST 1 VIEW COMPARISON:  09/20/2016 . FINDINGS: Cardiomegaly with normal pulmonary vascularity. Tiny calcified nodular density noted in the left upper lobe, most likely tiny calcified granuloma. Mild right base subsegmental atelectasis No pleural effusion or pneumothorax . IMPRESSION: Cardiomegaly. No pulmonary venous congestion. Mild right base subsegmental atelectasis. Electronically Signed   By: Marcello Moores  Register   On: 01/28/2017 08:00    Results for TANAYA, DUNIGAN (MRN [id number]) as of 01/28/2017 10:19  Ref. Range 09/21/2016 06:06 09/22/2016 05:48 09/23/2016 06:00 01/28/2017 09:09  BUN Latest Ref Range: 6 - 20 mg/dL 20 19 16 23  (H)  Creatinine Latest Ref Range: 0.44 - 1.00 mg/dL 1.00 0.90 1.01 (H) 1.89 (H)    1005:  IVF bolus x3L NS (89ml/kg) given for: elevated lactic acid,  hypotension, elevated CK, and BUN/Cr from baseline. BP improving.  No fever rectally. +UTI; UC pending; IV rocephin given.  Troponin elevated, but pt does have chronic troponin elevations and currently denies CP.  Potassium repleted PO. T/C to [facility] Dr. Sarajane Jews, case discussed, including:  HPI, pertinent PM/SHx, VS/PE, dx testing, ED course and treatment:  Agreeable to admit.  1110:  Pt c/o "feeling cold" and having shaking chills. Denies any other symptoms. Remains awake/alert, resps easy. HR 130's with significant artifact on monitor. Will dose APAP for possible fever.    Final Clinical Impressions(s) / ED Diagnoses  Final diagnoses:  None    New Prescriptions New Prescriptions   No medications on file      Gabriella Graven, DO 01/30/17 1601

## 2017-01-28 NOTE — ED Notes (Signed)
Dr Sarajane Jews paged

## 2017-01-28 NOTE — Progress Notes (Signed)
Pharmacy Antibiotic Note  [name] is a 76 y.o. female admitted on 01/28/2017 with sepsis.  Pharmacy has been consulted for ROCEPHIN dosing.  Plan: Rocephin 2gm IV q24hrs Monitor labs, progress, c/s  Height: 6\' 1"  (185.4 cm) Weight: 200 lb (90.7 kg) IBW/kg (Calculated) : 75.4  Temp (24hrs), Avg:98.5 F (36.9 C), Min:96.9 F (36.1 C), Max:100.8 F (38.2 C)   Recent Labs Lab 01/28/17 0726 01/28/17 0737 01/28/17 0909 01/28/17 1133  WBC 25.3*  --   --   --   CREATININE  --   --  1.89*  --   LATICACIDVEN  --  2.95*  --  4.86*    Estimated Creatinine Clearance: 33.1 mL/min (A) (by C-G formula based on SCr of 1.89 mg/dL (H)).    Allergies  Allergen Reactions  . Fenofibrate Nausea And Vomiting  . Pravastatin Sodium Nausea And Vomiting  . Sulfonamide Derivatives Other (See Comments)    unknown  . Ciprofloxacin Rash    Antimicrobials this admission: Rocephin 3/16 >>   Dose adjustments this admission:  Microbiology results:  BCx: pending  UCx: pending   Sputum:    MRSA PCR:   Thank you for allowing pharmacy to be a part of this patient's care.  Hart Robinsons A 01/28/2017 2:08 PM

## 2017-01-28 NOTE — Progress Notes (Signed)
CRITICAL VALUE ALERT  Critical value received:  Lactic acid of 2.4 Date of notification: 01/28/17  Time of notification: 2000  Critical value read back: yes  Nurse who received alert:  Heron Sabins  MD notified (1st page):  Hal Hope  Time of first page: 2015  MD notified (2nd page):  Time of second page:  Responding MD:  Hal Hope  Time MD responded:  2020   Called MD to make him aware of this lab result as well as a oral temp of 102.6  Ericka Pontiff, RN 8:29 PM 01/28/17

## 2017-01-28 NOTE — Progress Notes (Signed)
Pt has had increased BP sys 150-160;s, HR 120's-130's and WOB , becoming SOB had to increase oxygen nasal cannula from 2L to 3.5L seemed to help a little, crackles/rhonchi.  Increased agitation and confusion as well. MD made aware and ordered .5 mg of ativan. Critical values from lab tonight include a lactic acid of 2.4 and + blood cx, Anaerobic bottle +gram neg rods. MD has also ordered a 500 ml bolus, and asked about any ordered antibiotics. Pt is due to receive a dose of rocephin in the morning at 1000. RN to give tylenol for 2000 oral temp of 102.6 Pt has increased anxiety and confusion and keeps trying to get up and states "I gotta pee". Not a lot of urine output seen and bladder scan looks to be fine no acute urinary retention seen att his time. Increased urgency and frequency but this may be due to the positive UA -possible uti? Doctor is aware of all the above. PT is still anxious even after the ativan but not as bad, RN will continue to monitor for any acute changes and new s/s.  Ericka Pontiff, RN 01/28/17 8:42 PM

## 2017-01-28 NOTE — ED Notes (Signed)
CRITICAL VALUE ALERT  Critical value received:  Troponin 0.11  Date of notification:  01/28/2017  Time of notification:  0350  Critical value read back:Yes.    Nurse who received alert: SDoster, RN  MD notified (1st page):  Thurnell Garbe  Time of first page:  [phone]  MD notified (2nd page):  Time of second page:  Responding MD:  Thurnell Garbe  Time MD responded:  [phone]

## 2017-01-28 NOTE — ED Notes (Signed)
MD Goodrich at bedside.

## 2017-01-28 NOTE — H&P (Signed)
History and Physical  [name] JJO:[id number] DOB: [birth year] DOA: 01/28/2017  PCP: Gabriella Nakayama, MD  Patient coming from: home  Chief Complaint: Fall  HPI:  76 year old woman PMH dementia presented with history of fall at home. Found to be febrile, hypotensive. Urinalysis positive for infection. Admitted for sepsis, UTI, acute kidney injury.  Patient has dementia and can recall no details leading to hospitalization. Daughter at bedside provides history. The patient lives alone but 2 daughters and granddaughter assist with all ADLs other than bathing. They organize her medications, cook, clean and put her to bed each night. She has been in her usual state of health lately with no recent medication changes. She does have a history of multiple falls at home. This morning the patient pressed her "life alert" device, granddaughter went to check and found the patient on the bedroom floor. Down for unknown period of time. Patient was then brought to the emergency department. Patient denies any particular complaints at this point.  ED Course: Temperature 100.8, heart rate 120s, SPO2 98% on room air, SBP 75-179. Treated with Tylenol, ceftriaxone, 3 L IV fluids. Pertinent labs: Creatinine 1.89, AST 50, potassium 3.0, total CK 2218, troponin 0.11, WBC 25.3, urinalysis grossly positive, lactic acid 2.95 >> 4.86  Imaging: CXR independently reviewed: No acute disease   Review of Systems:  Unreliable secondary to dementia but patient reports negative for fever, visual changes, sore throat, rash, new muscle aches, chest pain, SOB, dysuria, bleeding, n/v/abdominal pain.  Past Medical History:  Diagnosis Date  . Abnormality of gait 06/06/2014  . Allergic rhinitis   . Anemia   . Ankle fracture, right   . Anxiety   . Arm fracture, left   . Arthritis    abnormal gait   . COPD (chronic obstructive pulmonary disease) (HCC)    chronic CO2 retention, decreased DLCO   . Cystic acne    adult     . 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 Degenerative disc disease, lumbar    L5 nerve impingement   . Depression   . DNR (do not resuscitate)   . GERD (gastroesophageal reflux disease)   . History of colon surgery    right hemicolectomy for high-grade adenomas in right colon 2013  . Hyperglycemia   . Hypertension   . Low back pain   . Mediastinal lymphadenopathy 1/9   resolving   . Memory difficulty 09/20/2014   mild dementia  . Onychomycosis   . OSA on CPAP   . Sleep apnea    stop bang score 6    Past Surgical History:  Procedure Laterality Date  . ABDOMINAL HYSTERECTOMY  1976   secondary to bleeding   . APPENDECTOMY    . COLON RESECTION  08/18/2012   Procedure: HAND ASSISTED LAPAROSCOPIC COLON RESECTION;  Surgeon: Jamesetta So, MD;  Location: [facility];  Service: General;  Laterality: N/A;  . COLONOSCOPY  07/20/2012   Dr. Gala Romney: multiple colonic polyps with large polyps on right side, s/p saline-assisted debulking piecemeal polypectomy and ablation. Not all removed. Path with tubulovillous adenomas, high grade dysplasia.   . COLONOSCOPY N/A 03/09/2013   ZSW:FUXNATF polyps-tubular adenomas. S/p right hemicolectomy. next tcs 02/2018  . epidural steroids    . ESOPHAGOGASTRODUODENOSCOPY (EGD) WITH ESOPHAGEAL DILATION N/A 02/06/2014   Procedure: ESOPHAGOGASTRODUODENOSCOPY (EGD) WITH ESOPHAGEAL DILATION;  Surgeon: Daneil Dolin, MD;  Location: [facility];  Service: Endoscopy;  Laterality: N/A;  9:30  . OOPHORECTOMY  1976  .  ORIF ANKLE FRACTURE  01/18/2012   Procedure: OPEN REDUCTION INTERNAL FIXATION (ORIF) ANKLE FRACTURE;  Surgeon: Arther Abbott, MD;  Location: [facility];  Service: Orthopedics;  Laterality: Left;  . ORIF ANKLE FRACTURE Right 01/13/2016   Procedure: OPEN REDUCTION INTERNAL FIXATION (ORIF) ANKLE FRACTURE;  Surgeon: Carole Civil, MD;  Location: [facility];  Service: Orthopedics;  Laterality: Right;     reports that she quit smoking about 10 years  ago. Her smoking use included Cigarettes. She has a 60.00 pack-year smoking history. She has never used smokeless tobacco. She reports that she does not drink alcohol or use drugs. Mobility: Wheelchair, able to transfer to commode  Allergies  Allergen Reactions  . Fenofibrate Nausea And Vomiting  . Pravastatin Sodium Nausea And Vomiting  . Sulfonamide Derivatives Other (See Comments)    unknown  . Ciprofloxacin Rash    Family History  Problem Relation Age of Onset  . Stomach cancer Father   . Cancer Father   . Cancer Mother     brain   . Breast cancer Mother   . Arthritis       Prior to Admission medications   Medication Sig Start Date End Date Taking? Authorizing Provider  acetaminophen (TYLENOL) 500 MG tablet Take 1,000 mg by mouth 2 (two) times daily.    Yes Historical Provider, MD  Albuterol Sulfate (PROAIR RESPICLICK) 993 (90 BASE) MCG/ACT AEPB Inhale 2 puffs into the lungs every 8 (eight) hours as needed. 11/11/15  Yes [name], MD  alendronate (FOSAMAX) 70 MG tablet Take 70 mg by mouth once a week. Take with a full glass of water on an empty stomach.   Yes Historical Provider, MD  arformoterol (BROVANA) 15 MCG/2ML NEBU Take 2 mLs (15 mcg total) by nebulization 2 (two) times daily. 04/25/12  Yes [name], MD  aspirin EC 81 MG tablet Take 81 mg by mouth daily.   Yes Historical Provider, MD  atorvastatin (LIPITOR) 10 MG tablet TAKE 1 TABLET EVERY DAY 10/28/16  Yes [name], MD  calcium-vitamin D (OSCAL WITH D) 500-200 MG-UNIT per tablet Take 1 tablet by mouth daily.    Yes Historical Provider, MD  donepezil (ARICEPT) 10 MG tablet TAKE 1 TABLET AT BEDTIME  (NOTE: DOSE INCREASE AS OF 08/12/15) 01/07/17  Yes [name], MD  ENSURE (ENSURE) Take 1 Can by mouth daily as needed. Take one by mouth with meals    Yes Historical Provider, MD  FLUoxetine (PROZAC) 40 MG capsule TAKE 1 CAPSULE EVERY DAY 01/07/17  Yes [name], MD  loratadine  (CLARITIN) 10 MG tablet Take 10 mg by mouth daily as needed for allergies.    Yes Historical Provider, MD  Multiple Vitamins-Minerals (CEROVITE SENIOR) TABS Take 1 tablet by mouth daily.    Yes Historical Provider, MD  omeprazole (PRILOSEC) 40 MG capsule TAKE 1 CAPSULE EVERY DAY 01/07/17  Yes [name], MD  verapamil (CALAN-SR) 240 MG CR tablet TAKE 1 TABLET AT BEDTIME 10/29/16  Yes [name], MD    Physical Exam: Vitals:   01/28/17 1115 01/28/17 1130 01/28/17 1200 01/28/17 1201  BP:  137/79 118/67   Pulse: (!) 128 (!) 122 (!) 129   Resp: (!) 27 (!) 23 (!) 21   Temp:    (!) 100.8 F (38.2 C)  TempSrc:    Oral  SpO2: 91% 93% 95%   Weight:      Height:       Constitutional:  .  Appears calm and comfortable Eyes:  . Pupils, Irises, lids appear unremarkable ENMT:  . Grossly normal hearing. Lips and tongue appear unremarkable. Neck:  . Soft, no masses noted . no thyromegaly Respiratory:  . CTA bilaterally, no w/r/r.  . Respiratory effort normal.  Cardiovascular:  . RRR, no m/r/g . No LE extremity edema   Abdomen:  . Abdomen appears normal; well-healed lower vertical incision midline, no tenderness or masses . No hernia noted  Musculoskeletal:  . RUE, LUE, RLE, LLE   o strength and tone normal, no atrophy, no abnormal movements o No tenderness, masses Skin:  . No rashes, lesions, ulcers . palpation of skin: no induration or nodules Psychiatric:  . Mental status o Mood, affect appropriate Confused   Wt Readings from Last 3 Encounters:  01/28/17 90.7 kg (200 lb)  11/10/16 96.6 kg (213 lb 0.6 oz)  09/20/16 100.8 kg (222 lb 3.6 oz)    I have personally reviewed following labs and imaging studies  Labs on Admission:  CBC:  Recent Labs Lab 01/28/17 0726  WBC 25.3*  NEUTROABS 23.0*  HGB 10.6*  HCT 33.8*  MCV 83.7  PLT 973   Basic Metabolic Panel:  Recent Labs Lab 01/28/17 0909  NA 143  K 3.0*  CL 111  CO2 25  GLUCOSE 110*  BUN 23*   CREATININE 1.89*  CALCIUM 7.9*   Liver Function Tests:  Recent Labs Lab 01/28/17 0909  AST 50*  ALT 21  ALKPHOS 51  BILITOT 0.6  PROT 5.7*  ALBUMIN 3.0*     Recent Labs Lab 01/28/17 0909  CKTOTAL 2,218*  TROPONINI 0.11*   : Urine analysis:    Component Value Date/Time   COLORURINE YELLOW 01/28/2017 0713   APPEARANCEUR CLOUDY (A) 01/28/2017 0713   LABSPEC >1.030 (H) 01/28/2017 0713   PHURINE 5.5 01/28/2017 0713   GLUCOSEU NEGATIVE 01/28/2017 0713   HGBUR LARGE (A) 01/28/2017 0713   HGBUR negative 10/30/2010 0822   BILIRUBINUR NEGATIVE 01/28/2017 0713   BILIRUBINUR small 05/20/2014 1349   KETONESUR TRACE (A) 01/28/2017 0713   PROTEINUR 100 (A) 01/28/2017 0713   UROBILINOGEN 0.2 06/22/2014 2250   NITRITE NEGATIVE 01/28/2017 0713   LEUKOCYTESUR MODERATE (A) 01/28/2017 0713    Recent Results (from the past 240 hour(s))  Blood culture (routine x 2)     Status: None (Preliminary result)   Collection Time: 01/28/17  7:28 AM  Result Value Ref Range Status   Specimen Description BLOOD BOTTLES DRAWN AEROBIC AND ANAEROBIC  Final   Special Requests RIGHT ANTECUBITAL 8CC EACH  Final   Culture NO GROWTH < 12 HOURS  Final   Report Status PENDING  Incomplete  Blood culture (routine x 2)     Status: None (Preliminary result)   Collection Time: 01/28/17  7:33 AM  Result Value Ref Range Status   Specimen Description BLOOD BOTTLES DRAWN AEROBIC AND ANAEROBIC  Final   Special Requests RIGHT ANTECUBITAL 8 CC EACH  Final   Culture NO GROWTH < 12 HOURS  Final   Report Status PENDING  Incomplete      Radiological Exams on Admission: Ct Head Wo Contrast  Result Date: 01/28/2017 CLINICAL DATA:  Unwitnessed fall EXAM: CT HEAD WITHOUT CONTRAST TECHNIQUE: Contiguous axial images were obtained from the base of the skull through the vertex without intravenous contrast. COMPARISON:  June 22, 2014 FINDINGS: Brain: Mild diffuse atrophy is stable. There is no intracranial mass,  hemorrhage, extra-axial fluid collection, or midline shift. Gray-white compartments are normal. No  evident acute infarct. Vascular: No hyperdense vessels. There are scattered foci of calcification in each carotid siphon region. Skull: The bony calvarium appears intact. Sinuses/Orbits: There are areas of ethmoid sinus opacification bilaterally. There is also mild mucosal thickening in several ethmoid air cells. Other visualized paranasal sinuses are clear. Orbits appear symmetric bilaterally. Other: Visualized mastoid air cells are clear. IMPRESSION: Mild atrophy. No intracranial mass, hemorrhage, or extra-axial fluid collection. Gray-white compartments appear unremarkable. Foci of atherosclerotic calcification noted bilaterally. There are areas of ethmoid sinus disease bilaterally. Electronically Signed   By: Lowella Grip III M.D.   On: 01/28/2017 08:55   Dg Chest Portable 1 View  Result Date: 01/28/2017 CLINICAL DATA:  Altered mental status. EXAM: PORTABLE CHEST 1 VIEW COMPARISON:  09/20/2016 . FINDINGS: Cardiomegaly with normal pulmonary vascularity. Tiny calcified nodular density noted in the left upper lobe, most likely tiny calcified granuloma. Mild right base subsegmental atelectasis No pleural effusion or pneumothorax . IMPRESSION: Cardiomegaly. No pulmonary venous congestion. Mild right base subsegmental atelectasis. Electronically Signed   By: Marcello Moores  Register   On: 01/28/2017 08:00    EKG: Independently revisinus rhythm, no acute changes. Second EKG poor quality, sinus tachycardia, no acute changes.  Principal Problem:   Sepsis ([place]) Active Problems:   COPD (chronic obstructive pulmonary disease) (HCC)   UTI (urinary tract infection)   AKI (acute kidney injury) (HCC)   Rhabdomyolysis   Elevated troponin   Dementia   Assessment/Plan 1. Sepsis secondary to UTI. Second lactic acid is elevated but patient is not hypotensive, mentation appears at baseline, does not appear toxic.     plan aggressive IV fluids, trend lactic acid.    empiric antibiotics, follow-up culture data 2. Acute kidney injury secondary to sepsis.  IV fluids, follow daily BMP 3. Rhabdomyolysis secondary to fall at home, prolonged immobility  IV fluids, repeat CK in the morning 4. Elevated troponin, secondary to fall at home, recommend dialysis. No evidence to suggest ACS. 5. Hypokalemia.  Replete. 6. Dementia. Appears to be at baseline. 7. Sleep apnea. Does not use CPAP at home.   Discussed in detail with daughter bedside, elevated lactic acid is concerning but the patient appears clinically well. Admit to stepdown.   It is my clinical opinion that admission to INPATIENT is reasonable and necessary in this patient  Given the aforementioned, the predictability of an adverse outcome is felt to be significant. I expect that the patient will require at least 2 midnights in the hospital to treat this condition.   DVT prophylaxis: heparin Code Status: DNR Family Communication: daughter/HCPOA at bedside   Time spent: 92 minutes  Murray Hodgkins, MD  [facility] Direct contact: [phone] --[place]ia [place]  --[email or web address]; password [email or web address]  7PM-7AM contact night coverage as above  01/28/2017, 12:55 PM

## 2017-01-28 NOTE — ED Notes (Signed)
Hospitalist paged at this time concerning Lactic Acid of 4.86.

## 2017-01-28 NOTE — ED Notes (Addendum)
Pt noted to be shaking with HR elevation 134. Stating she is cold.  Rectal temp 98.6. Dr Thurnell Garbe in to re eval pt. Questions if getting ready to spoke temp. Will adm tylenol po per Dr Thurnell Garbe. Will Page Dr Sarajane Jews. Attempted EKG but due to pt shaking unable to get good tracing

## 2017-01-28 NOTE — ED Triage Notes (Signed)
Pt arrived by EMS from home. Called out for a fall, unknown time down, pt w/ a hx of dementia, pt denies any pain.

## 2017-01-28 NOTE — Progress Notes (Addendum)
CRITICAL VALUE ALERT  Critical value received:  +Blood cultures. Anaerobic bottle + gram negative rods  Date of notification:  01/28/17  Time of notification:  2023  Critical value read back: yes  Nurse who received alert:  Gerome Sam, RN  MD notified (1st page):  Hal Hope  Time of first page:  2024  MD notified (2nd page):  Time of second page:  Responding MD:  Hal Hope  Time MD responded: 2025  MD aware of lab result has ordered a 500 cc bolus.asked about antibiotics ordered. There is a dose of rocephin due in the morning 01/29/17 at 1000. MD believes this antibiotic will take care of this.  ___LAB called with results on the rest of blood cultures. Aerobic bottle + gram neg rods.  Ericka Pontiff, RN 01/28/17 8:32 PM

## 2017-01-28 NOTE — ED Notes (Signed)
Spoke with Dr Sarajane Jews and he will come to ED to eval

## 2017-01-29 DIAGNOSIS — R7881 Bacteremia: Secondary | ICD-10-CM

## 2017-01-29 DIAGNOSIS — J441 Chronic obstructive pulmonary disease with (acute) exacerbation: Secondary | ICD-10-CM

## 2017-01-29 LAB — CBC
HEMATOCRIT: 30.8 % — AB (ref 36.0–46.0)
Hemoglobin: 9.8 g/dL — ABNORMAL LOW (ref 12.0–15.0)
MCH: 26.3 pg (ref 26.0–34.0)
MCHC: 31.8 g/dL (ref 30.0–36.0)
MCV: 82.8 fL (ref 78.0–100.0)
PLATELETS: 219 10*3/uL (ref 150–400)
RBC: 3.72 MIL/uL — ABNORMAL LOW (ref 3.87–5.11)
RDW: 16 % — AB (ref 11.5–15.5)
WBC: 26.4 10*3/uL — ABNORMAL HIGH (ref 4.0–10.5)

## 2017-01-29 LAB — BLOOD CULTURE ID PANEL (REFLEXED)
Acinetobacter baumannii: NOT DETECTED
CANDIDA KRUSEI: NOT DETECTED
CANDIDA PARAPSILOSIS: NOT DETECTED
CANDIDA TROPICALIS: NOT DETECTED
CARBAPENEM RESISTANCE: NOT DETECTED
Candida albicans: NOT DETECTED
Candida glabrata: NOT DETECTED
Enterobacter cloacae complex: NOT DETECTED
Enterobacteriaceae species: DETECTED — AB
Enterococcus species: NOT DETECTED
Escherichia coli: DETECTED — AB
HAEMOPHILUS INFLUENZAE: NOT DETECTED
KLEBSIELLA OXYTOCA: NOT DETECTED
KLEBSIELLA PNEUMONIAE: NOT DETECTED
Listeria monocytogenes: NOT DETECTED
Neisseria meningitidis: NOT DETECTED
PROTEUS SPECIES: NOT DETECTED
PSEUDOMONAS AERUGINOSA: NOT DETECTED
SERRATIA MARCESCENS: NOT DETECTED
STAPHYLOCOCCUS AUREUS BCID: NOT DETECTED
STREPTOCOCCUS PYOGENES: NOT DETECTED
STREPTOCOCCUS SPECIES: NOT DETECTED
Staphylococcus species: NOT DETECTED
Streptococcus agalactiae: NOT DETECTED
Streptococcus pneumoniae: NOT DETECTED

## 2017-01-29 LAB — BASIC METABOLIC PANEL
Anion gap: 7 (ref 5–15)
BUN: 20 mg/dL (ref 6–20)
CHLORIDE: 112 mmol/L — AB (ref 101–111)
CO2: 23 mmol/L (ref 22–32)
CREATININE: 1.25 mg/dL — AB (ref 0.44–1.00)
Calcium: 7.5 mg/dL — ABNORMAL LOW (ref 8.9–10.3)
GFR calc Af Amer: 48 mL/min — ABNORMAL LOW (ref >60)
GFR calc non Af Amer: 41 mL/min — ABNORMAL LOW (ref >60)
GLUCOSE: 85 mg/dL (ref 65–99)
Potassium: 3.6 mmol/L (ref 3.5–5.1)
Sodium: 142 mmol/L (ref 135–145)

## 2017-01-29 LAB — TROPONIN I
Troponin I: 0.31 ng/mL (ref <0.03)
Troponin I: 0.23 ng/mL (ref <0.03)

## 2017-01-29 LAB — CK: Total CK: 2295 U/L — ABNORMAL HIGH (ref 38–234)

## 2017-01-29 MED ORDER — ALBUTEROL SULFATE (2.5 MG/3ML) 0.083% IN NEBU: 2.5000 mg | INHALATION_SOLUTION | RESPIRATORY_TRACT | Status: DC | PRN

## 2017-01-29 MED ORDER — ORAL CARE MOUTH RINSE
15.0000 mL | Freq: Two times a day (BID) | OROMUCOSAL | Status: DC
  Administered 2017-01-29 – 2017-02-01 (×6): 15 mL via OROMUCOSAL

## 2017-01-29 MED ORDER — IPRATROPIUM-ALBUTEROL 0.5-2.5 (3) MG/3ML IN SOLN
3.0000 mL | RESPIRATORY_TRACT | Status: DC
  Administered 2017-01-29 – 2017-02-01 (×17): 3 mL via RESPIRATORY_TRACT
  Filled 2017-01-29 (×17): qty 3

## 2017-01-29 MED ORDER — METHYLPREDNISOLONE SODIUM SUCC 40 MG IJ SOLR
40.0000 mg | Freq: Two times a day (BID) | INTRAMUSCULAR | Status: DC
  Administered 2017-01-29 – 2017-01-30 (×4): 40 mg via INTRAVENOUS
  Filled 2017-01-29 (×4): qty 1

## 2017-01-29 NOTE — Progress Notes (Signed)
PROGRESS NOTE  [name] KJZ:[id number] DOB: 1941-05-27 DOA: 01/28/2017 PCP: Tula Nakayama, MD  Brief Narrative: 76 year old woman PMH dementia presented with history of fall at home. Found to be febrile, hypotensive. Urinalysis positive for infection. Admitted for sepsis, UTI, acute kidney injury.  Assessment/Plan 1. Sepsis secondary to E coli bacteremia secondary to UTI.  Clinically improved, hemodynamics stable, tachycardia has decreased. Still with fever in the last 24 hours.  Continue empiric antibiotics, follow-up culture data 2. Acute hypoxic respiratory failure secondary to COPD exacerbation.  Bronchodilators, add steroids. Wean oxygen as tolerated. 3. Acute kidney injury secondary sepsis  Resolved with IV fluids. 4. Rhabdomyolysis secondary to fall at home, prolonged immobility  Renal function preserved. Continue to trend CK daily until significantly decreased. 5. Elevated troponin secondary to rhabdomyolysis. No further evaluation planned. 6. Dementia with confusion. 7. Obstructive sleep apnea, does not use CPAP at home.   Appears stable with improving renal function although she is now hypoxic and requiring oxygen. Continue antibiotics, add steroids and bronchodilators scheduled, trend lab work. Keep in stepdown for now. Will likely need 2-3 days of additional hospitalization.  DVT prophylaxis: enoxaparin Code Status: DNR Family Communication: none Disposition Plan: home    Murray Hodgkins, MD  [facility] Direct contact: [phone] --[place] [place]  --[email or web address]; password [email or web address]  7PM-7AM contact night coverage as above 01/29/2017, 9:32 AM  LOS: 1 day   Consultants:    Procedures:    Antimicrobials:  Ceftriaxone 3/16 >>  Interval history/Subjective: Confused overnight. Incontinent of urine. Now hypoxic, on high flow nasal cannula.  Objective: Vitals:   01/29/17 0400 01/29/17 0720 01/29/17 0800 01/29/17 0847  BP: (!)  142/78  119/69   Pulse: (!) 106 (!) 107    Resp: (!) 21 16 15    Temp: (!) 100.9 F (38.3 C) 98.8 F (37.1 C)    TempSrc: Oral Oral    SpO2: 98% 100% 100% 100%  Weight:      Height: 6\' 1"  (1.854 m)       Intake/Output Summary (Last 24 hours) at 01/29/17 0932 Last data filed at 01/29/17 0856  Gross per 24 hour  Intake             3240 ml  Output                1 ml  Net             3239 ml     Filed Weights   01/28/17 0654  Weight: 90.7 kg (200 lb)    Exam:    Constitutional:Appears calm, comfortable, nontoxic. Eyes: Lids appear unremarkable Cardiovascular: Regular rate and rhythm. No murmur, rub or gallop. No lower extremity edema. Telemetry sinus rhythm. Respiratory: Bilateral expiratory wheeze, minimal crackles, some rhonchi. Mildly increased respiratory effort. Able to speak in full sentences. Abdomen soft nontender nondistended. Skin appears grossly unremarkable Psychiatric: Pleasantly confused.   I have personally reviewed following labs and imaging studies:  Multiple voids  Creatinine trending down, 1.25. BUN has normalized.  CK without significant change 2295  Lactic acid down to 2.2, 2.4  WBC without change 26.4  Hemoglobin stable 9.8  Scheduled Meds: . arformoterol  15 mcg Nebulization BID  . aspirin EC  81 mg Oral Daily  . cefTRIAXone (ROCEPHIN)  IV  2 g Intravenous Q24H  . FLUoxetine  40 mg Oral Daily  . ipratropium-albuterol  3 mL Nebulization Q4H  . mouth rinse  15 mL Mouth Rinse BID  . methylPREDNISolone (SOLU-MEDROL) injection  40 mg Intravenous Q12H  . pantoprazole  40 mg Oral Daily  . sodium chloride flush  3 mL Intravenous Q12H   Continuous Infusions: . sodium chloride 150 mL/hr at 01/29/17 0530    Principal Problem:   Sepsis ([place]) Active Problems:   COPD with acute exacerbation (HCC)   UTI (urinary tract infection)   AKI (acute kidney injury) (HCC)   Rhabdomyolysis   Elevated troponin   Dementia   E coli bacteremia   LOS:  1 day

## 2017-01-29 NOTE — Progress Notes (Signed)
Paged MD to advise pt had a troponin of 0.31. Will monitor patient

## 2017-01-30 DIAGNOSIS — A4151 Sepsis due to Escherichia coli [E. coli]: Principal | ICD-10-CM

## 2017-01-30 DIAGNOSIS — N390 Urinary tract infection, site not specified: Secondary | ICD-10-CM

## 2017-01-30 DIAGNOSIS — B9620 Unspecified Escherichia coli [E. coli] as the cause of diseases classified elsewhere: Secondary | ICD-10-CM

## 2017-01-30 DIAGNOSIS — I248 Other forms of acute ischemic heart disease: Secondary | ICD-10-CM

## 2017-01-30 LAB — CBC
HCT: 28.3 % — ABNORMAL LOW (ref 36.0–46.0)
Hemoglobin: 9.1 g/dL — ABNORMAL LOW (ref 12.0–15.0)
MCH: 26.5 pg (ref 26.0–34.0)
MCHC: 32.2 g/dL (ref 30.0–36.0)
MCV: 82.3 fL (ref 78.0–100.0)
PLATELETS: 211 10*3/uL (ref 150–400)
RBC: 3.44 MIL/uL — AB (ref 3.87–5.11)
RDW: 16.3 % — ABNORMAL HIGH (ref 11.5–15.5)
WBC: 22.8 10*3/uL — AB (ref 4.0–10.5)

## 2017-01-30 LAB — TROPONIN I: Troponin I: 0.08 ng/mL (ref <0.03)

## 2017-01-30 LAB — BASIC METABOLIC PANEL WITH GFR
Anion gap: 5 (ref 5–15)
BUN: 27 mg/dL — ABNORMAL HIGH (ref 6–20)
CO2: 22 mmol/L (ref 22–32)
Calcium: 7.2 mg/dL — ABNORMAL LOW (ref 8.9–10.3)
Chloride: 114 mmol/L — ABNORMAL HIGH (ref 101–111)
Creatinine, Ser: 1.05 mg/dL — ABNORMAL HIGH (ref 0.44–1.00)
GFR calc Af Amer: 59 mL/min — ABNORMAL LOW (ref >60)
GFR calc non Af Amer: 51 mL/min — ABNORMAL LOW (ref >60)
Glucose, Bld: 142 mg/dL — ABNORMAL HIGH (ref 65–99)
Potassium: 3.6 mmol/L (ref 3.5–5.1)
Sodium: 141 mmol/L (ref 135–145)

## 2017-01-30 LAB — CK: CK TOTAL: 736 U/L — AB (ref 38–234)

## 2017-01-30 MED ORDER — GUAIFENESIN-DM 100-10 MG/5ML PO SYRP
5.0000 mL | ORAL_SOLUTION | ORAL | Status: DC | PRN
  Administered 2017-01-30 – 2017-01-31 (×2): 5 mL via ORAL
  Filled 2017-01-30 (×2): qty 5

## 2017-01-30 NOTE — Progress Notes (Signed)
PROGRESS NOTE  [name] KDX:[id number] DOB: May 16, 1941 DOA: 01/28/2017 PCP: Tula Nakayama, MD  Brief Narrative: 76 year old woman PMH dementia presented with history of fall at home. Found to be febrile, hypotensive. Urinalysis positive for infection. Admitted for sepsis, UTI, acute kidney injury.  Assessment/Plan 1. Sepsis secondary to E coli bacteremia secondary to E coli UTI. Afebrile. Hemodynamics stable. Tachycardia has resolved.  Follow-up culture data, continue empiric antibiotics. 2. Acute hypoxic respiratory failure secondary to COPD exacerbation  Modest improvement. Oxygen requirement has decreased. Continue steroids, bronchodilators. Continue to wean oxygen as tolerated. 3. Acute kidney injury secondary to sepsis, resolved. 4. Rhabdomyolysis secondary to fall at home, prolonged immobility, resolving with IV fluids. Renal function preserved. 5. Demand ischemia secondary to sepsis. Troponin trending down. No evidence of ACS. No further evaluation suggested. 6. Dementia with confusion, improved. 7. Obstructive sleep apnea, does not use CPAP at home.    Overall making substantial improvement, tachycardia has resolved, hemodynamics are stable. Other significant issues COPD exacerbation which is modestly improved today. Will likely need at least another 48 hours in the hospital.  DVT prophylaxis: enoxaparin Code Status: DNR Family Communication: none Disposition Plan: home    Murray Hodgkins, MD  [facility] Direct contact: [phone] --[address]ia [place]  --[email or web address]; password [email or web address]  7PM-7AM contact night coverage as above 01/30/2017, 10:27 AM  LOS: 2 days   Consultants:    Procedures:    Antimicrobials:  Ceftriaxone 3/16 >>  Interval history/Subjective: Slept okay. Breathing still is short. No chest pain. Eating okay.  Objective: Vitals:   01/30/17 0300 01/30/17 0400 01/30/17 0500 01/30/17 0800  BP: 132/72 (!) 143/84  (!) 142/80    Pulse: 81 83 76 74  Resp: 11 12 11  (!) 9  Temp:  98 F (36.7 C)  97.6 F (36.4 C)  TempSrc:  Oral  Oral  SpO2: 93% 97% 97% 96%  Weight:   101.7 kg (224 lb 3.3 oz)   Height:        Intake/Output Summary (Last 24 hours) at 01/30/17 1027 Last data filed at 01/30/17 0916  Gross per 24 hour  Intake             3908 ml  Output              850 ml  Net             3058 ml     Filed Weights   01/28/17 0654 01/30/17 0500  Weight: 90.7 kg (200 lb) 101.7 kg (224 lb 3.3 oz)    Exam:    Constitutional: Appears calm, comfortable. Nontoxic. Cardiovascular: Regular rate and rhythm. No murmur, rub or gallop. No lower extremity edema. Telemetry sinus rhythm. Respiratory: Diffuse wheezes bilaterally, expiratory. No rhonchi or rales. Mild increased respiratory effort. Able to speak in full sentences. Abdomen soft. Skin appears grossly normal Psychiatric grossly normal mood and affect. Speech fluent and appropriate.  I have personally reviewed following labs and imaging studies:  Urine output with multiple voids. BM 4.  Troponin 0.08, trending down  CK 736, trending down  BMP unremarkable  WBC trending down, 22.8  Hemoccult stable 9.1. Platelet count within normal limits.  Scheduled Meds: . arformoterol  15 mcg Nebulization BID  . aspirin EC  81 mg Oral Daily  . cefTRIAXone (ROCEPHIN)  IV  2 g Intravenous Q24H  . FLUoxetine  40 mg Oral Daily  . ipratropium-albuterol  3 mL Nebulization Q4H  . mouth rinse  15 mL Mouth Rinse BID  .  methylPREDNISolone (SOLU-MEDROL) injection  40 mg Intravenous Q12H  . pantoprazole  40 mg Oral Daily  . sodium chloride flush  3 mL Intravenous Q12H   Continuous Infusions: . sodium chloride 150 mL/hr at 01/30/17 1024    Principal Problem:   Sepsis due to Escherichia coli (E. coli) (HCC) Active Problems:   COPD with acute exacerbation (HCC)   E. coli UTI   Rhabdomyolysis   Dementia   E coli bacteremia   Demand ischemia ([place])   LOS: 2  days

## 2017-01-31 DIAGNOSIS — F0390 Unspecified dementia without behavioral disturbance: Secondary | ICD-10-CM

## 2017-01-31 LAB — CULTURE, BLOOD (ROUTINE X 2)

## 2017-01-31 LAB — URINE CULTURE

## 2017-01-31 MED ORDER — PREDNISONE 20 MG PO TABS
40.0000 mg | ORAL_TABLET | Freq: Every day | ORAL | Status: DC
  Administered 2017-02-01: 40 mg via ORAL
  Filled 2017-01-31 (×2): qty 2

## 2017-01-31 NOTE — Progress Notes (Signed)
PROGRESS NOTE  [name] YQI:[id number] DOB: July 09, 1941 DOA: 01/28/2017 PCP: Tula Nakayama, MD  Brief Narrative: 76 year old woman PMH dementia presented with history of fall at home. Found to be febrile, hypotensive. Urinalysis positive for infection. Admitted for sepsis, UTI, acute kidney injury.  Assessment/Plan 1. Sepsis secondary to Escherichia coli bacteremia secondary to Escherichia coli UTI.  Continues to improve. Change to oral antibiotics 3/20. 2. Acute hypoxic respiratory failure secondary to COPD exacerbation  Improving, less wheezing today, less tachypnea/dyspnea.  Wean oxygen, wean steroids. Continue bronchodilators. 3. Acute kidney injury secondary to sepsis. Resolved. 4. Rhabdomyolysis secondary to fall at home, prolonged immobility. Resolved with IV fluids. Renal function stable. 5. Demand ischemia secondary to sepsis. No evidence of ACS. No further evaluation. 6. Dementia, confusion. Stable, improved. 7. Obstructive sleep apnea, does not use CPAP at home.   Overall improving. Plan physical therapy consultation. Anticipate discharge home on oral antibiotics 3/20.   DVT prophylaxis: enoxaparin Code Status: DNR Family Communication: none Disposition Plan: home    Murray Hodgkins, MD  [facility] Direct contact: [phone] --[place] [place]  --[email or web address]; password [email or web address]  7PM-7AM contact night coverage as above 01/31/2017, 10:12 AM  LOS: 3 days   Consultants:    Procedures:    Antimicrobials:  Ceftriaxone 3/16 >>  Interval history/Subjective: A little short of breath but otherwise doing well. No pain.  Objective: Vitals:   01/31/17 0355 01/31/17 0547 01/31/17 0724 01/31/17 0729  BP:  (!) 149/83    Pulse:  96    Resp:  18    Temp:  97.6 F (36.4 C)    TempSrc:  Oral    SpO2: 96% 99% 97% 97%  Weight:      Height:        Intake/Output Summary (Last 24 hours) at 01/31/17 1012 Last data filed at 01/31/17 0544  Gross per 24 hour  Intake              940 ml  Output                0 ml  Net              940 ml     Filed Weights   01/28/17 0654 01/30/17 0500  Weight: 90.7 kg (200 lb) 101.7 kg (224 lb 3.3 oz)    Exam:    Constitutional: Appears calm, comfortable. Lying in bed. Respiratory: Few expiratory wheezes, no rhonchi or rales. Normal respiratory effort. Cardiovascular: Regular rate and rhythm. No murmur, rub or gallop. No lower extremity edema. Telemetry sinus tachycardia. Psychiatric: Grossly normal mood and affect. Speech fluent and appropriate.  I have personally reviewed following labs and imaging studies:  No new labs  Scheduled Meds: . arformoterol  15 mcg Nebulization BID  . aspirin EC  81 mg Oral Daily  . cefTRIAXone (ROCEPHIN)  IV  2 g Intravenous Q24H  . FLUoxetine  40 mg Oral Daily  . ipratropium-albuterol  3 mL Nebulization Q4H  . mouth rinse  15 mL Mouth Rinse BID  . methylPREDNISolone (SOLU-MEDROL) injection  40 mg Intravenous Q12H  . pantoprazole  40 mg Oral Daily  . sodium chloride flush  3 mL Intravenous Q12H   Continuous Infusions:   Principal Problem:   Sepsis due to Escherichia coli (E. coli) (HCC) Active Problems:   COPD with acute exacerbation (HCC)   E. coli UTI   Rhabdomyolysis   Dementia   E coli bacteremia   Demand ischemia ([place])   LOS:  3 days

## 2017-01-31 NOTE — Progress Notes (Signed)
Pharmacy Antibiotic Note  [name] is a 76 y.o. female admitted on 01/28/2017 with sepsis.  Pharmacy has been consulted for ROCEPHIN dosing.  Plan: Rocephin 2gm IV q24hrs Monitor labs, progress, c/s  Height: 6\' 1"  (185.4 cm) Weight: 224 lb 3.3 oz (101.7 kg) IBW/kg (Calculated) : 75.4  Temp (24hrs), Avg:98.2 F (36.8 C), Min:97.6 F (36.4 C), Max:98.8 F (37.1 C)   Recent Labs Lab 01/28/17 0726 01/28/17 0737 01/28/17 0909 01/28/17 1133 01/28/17 1520 01/28/17 1816 01/29/17 0503 01/30/17 0900  WBC 25.3*  --   --   --   --   --  26.4* 22.8*  CREATININE  --   --  1.89*  --   --   --  1.25* 1.05*  LATICACIDVEN  --  2.95*  --  4.86* 2.2* 2.4*  --   --     Estimated Creatinine Clearance: 62.8 mL/min (A) (by C-G formula based on SCr of 1.05 mg/dL (H)).    Allergies  Allergen Reactions  . Fenofibrate Nausea And Vomiting  . Pravastatin Sodium Nausea And Vomiting  . Sulfonamide Derivatives Other (See Comments)    unknown  . Ciprofloxacin Rash    Antimicrobials this admission: Rocephin 3/16 >>   Dose adjustments this admission:  Results for orders placed or performed during the hospital encounter of 01/28/17  Urine culture     Status: Abnormal (Preliminary result)   Collection Time: 01/28/17  7:13 AM  Result Value Ref Range Status   Specimen Description URINE, CATHETERIZED  Final   Special Requests NONE  Final   Culture (A)  Final    >=100,000 COLONIES/mL ESCHERICHIA COLI SUSCEPTIBILITIES TO FOLLOW Performed at [facility] [address] [place], [place] [address]    Report Status PENDING  Incomplete  Blood culture (routine x 2)     Status: Abnormal   Collection Time: 01/28/17  7:28 AM  Result Value Ref Range Status   Specimen Description BLOOD BOTTLES DRAWN AEROBIC AND ANAEROBIC  Final   Special Requests RIGHT ANTECUBITAL 8CC EACH  Final   Culture  Setup Time   Final    GRAM NEGATIVE RODS Gram Stain Report Called to,Read Back By and Verified  With: ROBERTS,T. AT 2020 ON 01/28/2017 BY EVA IN BOTH AEROBIC AND ANAEROBIC BOTTLES Performed at [facility], READ BACK BY AND VERIFIED WITH: B. PUTTMAN PHARMD, AT 2094 01/29/17 BY Rush [facility]mark Performed at [facility], [place] [address], [place], [place] [address]    Culture ESCHERICHIA COLI (A)  Final   Report Status 01/31/2017 FINAL  Final   Organism ID, Bacteria ESCHERICHIA COLI  Final      Susceptibility   Escherichia coli - MIC*    AMPICILLIN >=32 RESISTANT Resistant     CEFAZOLIN <=4 SENSITIVE Sensitive     CEFEPIME <=1 SENSITIVE Sensitive     CEFTAZIDIME <=1 SENSITIVE Sensitive     CEFTRIAXONE <=1 SENSITIVE Sensitive     CIPROFLOXACIN <=0.25 SENSITIVE Sensitive     GENTAMICIN <=1 SENSITIVE Sensitive     IMIPENEM <=0.25 SENSITIVE Sensitive     TRIMETH/SULFA >=320 RESISTANT Resistant     AMPICILLIN/SULBACTAM >=32 RESISTANT Resistant     PIP/TAZO <=4 SENSITIVE Sensitive     Extended ESBL NEGATIVE Sensitive     * ESCHERICHIA COLI  Blood Culture ID Panel (Reflexed)     Status: Abnormal   Collection Time: 01/28/17  7:28 AM  Result Value Ref Range Status   Enterococcus species NOT  DETECTED NOT DETECTED Final   Listeria monocytogenes NOT DETECTED NOT DETECTED Final   Staphylococcus species NOT DETECTED NOT DETECTED Final   Staphylococcus aureus NOT DETECTED NOT DETECTED Final   Streptococcus species NOT DETECTED NOT DETECTED Final   Streptococcus agalactiae NOT DETECTED NOT DETECTED Final   Streptococcus pneumoniae NOT DETECTED NOT DETECTED Final   Streptococcus pyogenes NOT DETECTED NOT DETECTED Final   Acinetobacter baumannii NOT DETECTED NOT DETECTED Final   Enterobacteriaceae species DETECTED (A) NOT DETECTED Final    Comment: Enterobacteriaceae represent a large family of gram-negative bacteria, not a single organism. CRITICAL RESULT CALLED TO, READ BACK BY AND VERIFIED WITH: B. PUTTMAN PHARMD, AT 8341 01/29/17 BY D. VANHOOK     Enterobacter cloacae complex NOT DETECTED NOT DETECTED Final   Escherichia coli DETECTED (A) NOT DETECTED Final    Comment: CRITICAL RESULT CALLED TO, READ BACK BY AND VERIFIED WITH: B. PUTTMAN PHARMD, AT 9622 01/29/17 BY D. VANHOOK    Klebsiella oxytoca NOT DETECTED NOT DETECTED Final   Klebsiella pneumoniae NOT DETECTED NOT DETECTED Final   Proteus species NOT DETECTED NOT DETECTED Final   Serratia marcescens NOT DETECTED NOT DETECTED Final   Carbapenem resistance NOT DETECTED NOT DETECTED Final   Haemophilus influenzae NOT DETECTED NOT DETECTED Final   Neisseria meningitidis NOT DETECTED NOT DETECTED Final   Pseudomonas aeruginosa NOT DETECTED NOT DETECTED Final   Candida albicans NOT DETECTED NOT DETECTED Final   Candida glabrata NOT DETECTED NOT DETECTED Final   Candida krusei NOT DETECTED NOT DETECTED Final   Candida parapsilosis NOT DETECTED NOT DETECTED Final   Candida tropicalis NOT DETECTED NOT DETECTED Final    Comment: Performed at [facility], [place] [address] [place], [place] [address]  Blood culture (routine x 2)     Status: None (Preliminary result)   Collection Time: 01/28/17  7:33 AM  Result Value Ref Range Status   Specimen Description BLOOD BOTTLES DRAWN AEROBIC AND ANAEROBIC  Final   Special Requests RIGHT ANTECUBITAL 8 CC EACH  Final   Culture NO GROWTH 3 DAYS  Final   Report Status PENDING  Incomplete  MRSA PCR Screening     Status: None   Collection Time: 01/28/17  1:45 PM  Result Value Ref Range Status   MRSA by PCR NEGATIVE NEGATIVE Final    Comment:        The GeneXpert MRSA Assay (FDA approved for NASAL specimens only), is one component of a comprehensive MRSA colonization surveillance program. It is not intended to diagnose MRSA infection nor to guide or monitor treatment for MRSA infections.    Thank you for allowing pharmacy to be a part of this patient's care.  Hart Robinsons A 01/31/2017 9:34 AM

## 2017-01-31 NOTE — Evaluation (Signed)
Physical Therapy Evaluation Patient Details Name: [name] MRN: [id number] DOB: 04-Jan-1941 Today's Date: 01/31/2017   History of Present Illness  76 year old woman PMH dementia presented with history of fall at home. Found to be febrile, hypotensive. Urinalysis positive for infection. Admitted for sepsis, UTI, acute kidney injury.      Clinical Impression  Pt received in bed, dtr present, and pt is agreeable to PT evaluation.  Dtr states that pt is only supposed to use her w/c when no one else is at the house with her.  She ambulates short household distances with RW and supervision.  She requires assistance for transfers in/out of the shower, but is able to wash herself, and dress herself.  During PT evaluation, she requires Min A for stand pivot transfer from the bed<>BSC.  She then was able to ambulate 12ft with RW and Min A due to unsteadiness as well as multiple episodes of knee buckling.  She is a high risk for falling at this time, and therefore, she is recommended for HHPT, as well as 24/7 supervision/assistance.  Dtr states that they have another family member who will be sitting with her for the next few days.       Follow Up Recommendations Home health PT;Supervision/Assistance - 24 hour    Equipment Recommendations  None recommended by PT    Recommendations for Other Services       Precautions / Restrictions Precautions Precautions: Fall Precaution Comments: Pt had a fall where she fractured her ankle about 1 year ago Restrictions Weight Bearing Restrictions: No      Mobility  Bed Mobility Overal bed mobility: Modified Independent                Transfers Overall transfer level: Needs assistance Equipment used: Rolling walker (2 wheeled) Transfers: Sit to/from [facility] Sit to Stand: Min guard Stand pivot transfers: Min assist          Ambulation/Gait Ambulation/Gait assistance: Min assist Ambulation Distance (Feet): 40  Feet Assistive device: Rolling walker (2 wheeled) Gait Pattern/deviations: Step-to pattern;Trunk flexed   Gait velocity interpretation: <1.8 ft/sec, indicative of risk for recurrent falls General Gait Details: Pt demonstrates B genu valgum, as well as excessive B pes planus.  She demonstrates knee buckling at least 3 times during the short distance of ambulation.    Stairs            Wheelchair Mobility    Modified Rankin (Stroke Patients Only)       Balance Overall balance assessment: History of Falls;Needs assistance Sitting-balance support: Feet supported;Bilateral upper extremity supported Sitting balance-Leahy Scale: Good     Standing balance support: Bilateral upper extremity supported Standing balance-Leahy Scale: Poor                               Pertinent Vitals/Pain Pain Assessment: No/denies pain ("just wheezing" )    Home Living   Living Arrangements: Alone Available Help at Discharge: Family;Available PRN/intermittently (daughters and granddaugther - in and out every day) Type of Home: House Home Access: Ramped entrance     Home Layout: One level Home Equipment: Grab bars - toilet;Cane - single point;Wheelchair - [employer] - 2 wheels;Walker - 4 wheels;Bedside commode;Shower seat;Grab bars - tub/shower;Other (comment) (O2 - 2L all the time. )      Prior Function     Gait / Transfers Assistance Needed: Pt uses the w/c to mobilize in the house when  she is alone, but the RW when her family is there.    ADL's / Homemaking Assistance Needed: independent with dressing, and assistance to get in and out of the shower, but she washes herself.          Hand Dominance   Dominant Hand: Right    Extremity/Trunk Assessment   Upper Extremity Assessment Upper Extremity Assessment: Overall WFL for tasks assessed    Lower Extremity Assessment Lower Extremity Assessment: Generalized weakness       Communication   Communication: No  difficulties  Cognition Arousal/Alertness: Awake/alert Behavior During Therapy: WFL for tasks assessed/performed Overall Cognitive Status: Within Functional Limits for tasks assessed                      General Comments      Exercises     Assessment/Plan    PT Assessment Patient needs continued PT services  PT Problem List Decreased strength;Decreased activity tolerance;Decreased mobility;Decreased knowledge of use of DME;Decreased safety awareness;Decreased knowledge of precautions       PT Treatment Interventions DME instruction;Gait training;Functional mobility training;Therapeutic activities;Therapeutic exercise;Balance training;Neuromuscular re-education;Patient/family education    PT Goals (Current goals can be found in the Care Plan section)  Acute Rehab PT Goals Patient Stated Goal: Pt wants to go home PT Goal Formulation: With patient/family Time For Goal Achievement: 02/07/17 Potential to Achieve Goals: Fair    Frequency Min 3X/week   Barriers to discharge Decreased caregiver support Pt lives alone, with intermittent daily assistance from dtr's and granddaughter.     Co-evaluation               End of Session Equipment Utilized During Treatment: Gait belt;Oxygen Activity Tolerance: Patient limited by fatigue Patient left: in bed;with call bell/phone within reach;with bed alarm set;with family/visitor present Nurse Communication: Mobility status (mobility sheet left hanging in the room. ) PT Visit Diagnosis: Other abnormalities of gait and mobility (R26.89);History of falling (Z91.81);Muscle weakness (generalized) (M62.81)    Functional Assessment Tool Used: AM-PAC 6 Clicks Basic Mobility;Clinical judgement Functional Limitation: Mobility: Walking and moving around Mobility: Walking and Moving Around Current Status (P1025): At least 20 percent but less than 40 percent impaired, limited or restricted Mobility: Walking and Moving Around Goal Status  [phone]): At least 1 percent but less than 20 percent impaired, limited or restricted    Time: 1022-1056 PT Time Calculation (min) (ACUTE ONLY): 34 min   Charges:   PT Evaluation $PT Eval Low Complexity: 1 Procedure PT Treatments $Gait Training: 8-22 mins   PT G Codes:   PT G-Codes **NOT FOR INPATIENT CLASS** Functional Assessment Tool Used: AM-PAC 6 Clicks Basic Mobility;Clinical judgement Functional Limitation: Mobility: Walking and moving around Mobility: Walking and Moving Around Current Status (O2423): At least 20 percent but less than 40 percent impaired, limited or restricted Mobility: Walking and Moving Around Goal Status [phone]): At least 1 percent but less than 20 percent impaired, limited or restricted     Beth Jayshaun Phillips, PT, DPT X: [phone]

## 2017-02-01 DIAGNOSIS — J9621 Acute and chronic respiratory failure with hypoxia: Secondary | ICD-10-CM

## 2017-02-01 LAB — CBC
HCT: 27.4 % — ABNORMAL LOW (ref 36.0–46.0)
Hemoglobin: 8.8 g/dL — ABNORMAL LOW (ref 12.0–15.0)
MCH: 26.3 pg (ref 26.0–34.0)
MCHC: 32.1 g/dL (ref 30.0–36.0)
MCV: 81.8 fL (ref 78.0–100.0)
PLATELETS: 250 10*3/uL (ref 150–400)
RBC: 3.35 MIL/uL — ABNORMAL LOW (ref 3.87–5.11)
RDW: 16.3 % — AB (ref 11.5–15.5)
WBC: 11.9 10*3/uL — ABNORMAL HIGH (ref 4.0–10.5)

## 2017-02-01 MED ORDER — PREDNISONE 10 MG PO TABS: ORAL_TABLET | ORAL | 0 refills | Status: DC

## 2017-02-01 MED ORDER — CEFUROXIME AXETIL 500 MG PO TABS: 500.0000 mg | ORAL_TABLET | Freq: Two times a day (BID) | ORAL | 0 refills | Status: DC

## 2017-02-01 NOTE — Discharge Summary (Signed)
Physician Discharge Summary  [name] BJY:[id number] DOB: February 26, 1941 DOA: 01/28/2017  PCP: Tula Nakayama, MD  Admit date: 01/28/2017 Discharge date: 02/01/2017  Recommendations for Outpatient Follow-up:  1. Follow-up resolution of UTI 2. Home health PT   Follow-up Information    [facility] Follow up.   Why:  Home health PT  Contact information: [address] [place] [phone]       Tula Nakayama, MD. Schedule an appointment as soon as possible for a visit in 1 week(s).   Specialty:  Family Medicine Contact information: [address] [place] [address] [phone]          Discharge Diagnoses:  1. Sepsis secondary to Escherichia coli bacteremia 2. Escherichia coli UTI 3. Acute on chronic hypoxic respiratory failure 4. COPD exacerbation 5. Acute kidney injury 6. Rhabdomyolysis 7. Demand ischemia 8. Dementia  Discharge Condition: improved Disposition: home with HHPT  Diet recommendation: Regular  Filed Weights   01/28/17 0654 01/30/17 0500 02/01/17 0606  Weight: 90.7 kg (200 lb) 101.7 kg (224 lb 3.3 oz) 101.6 kg (224 lb 1.6 oz)    History of present illness:  76 year old woman PMH dementia presented with history of fall at home. Found to be febrile, hypotensive. Urinalysis positive for infection. Admitted for sepsis, UTI, acute kidney injury.  Hospital Course:  Patient gradually improved with IV antibiotics with resolution of sepsis symptomatology. Hospitalization was relatively uncomplicated, individual issues as below.  1. Sepsis secondary to Escherichia coli bacteremia, secondary to Escherichia coli UTI. Improved with antibiotics, plan of femur days of oral antibiotics as an outpatient. 2. Acute on chronic hypoxic respiratory failure secondary to COPD exacerbation. Improved with standard treatment. Plan steroid taper as an outpatient. 3. Acute kidney injury secondary to sepsis,  resolved with IV fluids. 4. Rhabdomyolysis secondary to fall at home, prolonged immobility. Responded appropriately to IV fluids. Renal function preserved. 5. Demand ischemia secondary to sepsis, resolved. No evidence of acute coronary syndrome. 6. Dementia remained stable.   Daughter updated by telephone.  Antimicrobials:  Ceftriaxone 3/16 >> 3/20  Ceftin 3/21 >> 3/23  S: Feels well, no complaints O: Vitals:   01/31/17 2140 02/01/17 0606  BP: 140/76 132/77  Pulse: 84 77  Resp: 20 20  Temp: 98.1 F (36.7 C) 98 F (36.7 C)  Gen. Appears calm, comfortable. Cardiovascular: Regular rate and rhythm. No murmur, rub or gallop. No lower extremity edema. Respiratory: Clear to auscultation bilaterally. No wheezes, rales or rhonchi. Normal respiratory effort. Psychiatric: Grossly normal mood and affect. Speech fluent and appropriate.  Discharge Instructions  Discharge Instructions    Diet general    Complete by:  As directed    Discharge instructions    Complete by:  As directed    Call your physician or seek immediate medical attention for fever, shortness of breath, pain, burning with urination, difficulty urinating or worsening of condition.   Increase activity slowly    Complete by:  As directed      Allergies as of 02/01/2017      Reactions   Fenofibrate Nausea And Vomiting   Pravastatin Sodium Nausea And Vomiting   Sulfonamide Derivatives Other (See Comments)   unknown   Ciprofloxacin Rash      Medication List    TAKE these medications   acetaminophen 500 MG tablet Commonly known as:  TYLENOL Take 1,000 mg by mouth 2 (two) times daily.   Albuterol Sulfate 108 (90 Base) MCG/ACT Aepb Commonly known  as:  PROAIR RESPICLICK Inhale 2 puffs into the lungs every 8 (eight) hours as needed.   alendronate 70 MG tablet Commonly known as:  FOSAMAX Take 70 mg by mouth once a week. Take with a full glass of water on an empty stomach.   arformoterol 15 MCG/2ML  [place]u Commonly known as:  BROVANA Take 2 mLs (15 mcg total) by nebulization 2 (two) times daily.   aspirin EC 81 MG tablet Take 81 mg by mouth daily.   atorvastatin 10 MG tablet Commonly known as:  LIPITOR TAKE 1 TABLET EVERY DAY   calcium-vitamin D 500-200 MG-UNIT tablet Commonly known as:  OSCAL WITH D Take 1 tablet by mouth daily.   cefUROXime 500 MG tablet Commonly known as:  CEFTIN Take 1 tablet (500 mg total) by mouth 2 (two) times daily with a meal. Start 3/21 in AM.   CEROVITE SENIOR Tabs Take 1 tablet by mouth daily.   donepezil 10 MG tablet Commonly known as:  ARICEPT TAKE 1 TABLET AT BEDTIME  (NOTE: DOSE INCREASE AS OF 08/12/15)   ENSURE Take 1 Can by mouth daily as needed. Take one by mouth with meals   FLUoxetine 40 MG capsule Commonly known as:  PROZAC TAKE 1 CAPSULE EVERY DAY   loratadine 10 MG tablet Commonly known as:  CLARITIN Take 10 mg by mouth daily as needed for allergies.   omeprazole 40 MG capsule Commonly known as:  PRILOSEC TAKE 1 CAPSULE EVERY DAY   predniSONE 10 MG tablet Commonly known as:  DELTASONE Take daily by mouth: 40 mg x3 days, then 20 mg x3 days, then 10 mg x3 days, then stop.   verapamil 240 MG CR tablet Commonly known as:  CALAN-SR TAKE 1 TABLET AT BEDTIME      Allergies  Allergen Reactions  . Fenofibrate Nausea And Vomiting  . Pravastatin Sodium Nausea And Vomiting  . Sulfonamide Derivatives Other (See Comments)    unknown  . Ciprofloxacin Rash    The results of significant diagnostics from this hospitalization (including imaging, microbiology, ancillary and laboratory) are listed below for reference.    Significant Diagnostic Studies: Ct Head Wo Contrast  Result Date: 01/28/2017 CLINICAL DATA:  Unwitnessed fall EXAM: CT HEAD WITHOUT CONTRAST TECHNIQUE: Contiguous axial images were obtained from the base of the skull through the vertex without intravenous contrast. COMPARISON:  June 22, 2014 FINDINGS: Brain: Mild  diffuse atrophy is stable. There is no intracranial mass, hemorrhage, extra-axial fluid collection, or midline shift. Gray-white compartments are normal. No evident acute infarct. Vascular: No hyperdense vessels. There are scattered foci of calcification in each carotid siphon region. Skull: The bony calvarium appears intact. Sinuses/Orbits: There are areas of ethmoid sinus opacification bilaterally. There is also mild mucosal thickening in several ethmoid air cells. Other visualized paranasal sinuses are clear. Orbits appear symmetric bilaterally. Other: Visualized mastoid air cells are clear. IMPRESSION: Mild atrophy. No intracranial mass, hemorrhage, or extra-axial fluid collection. Gray-white compartments appear unremarkable. Foci of atherosclerotic calcification noted bilaterally. There are areas of ethmoid sinus disease bilaterally. Electronically Signed   By: Lowella Grip III M.D.   On: 01/28/2017 08:55   Dg Chest Portable 1 View  Result Date: 01/28/2017 CLINICAL DATA:  Altered mental status. EXAM: PORTABLE CHEST 1 VIEW COMPARISON:  09/20/2016 . FINDINGS: Cardiomegaly with normal pulmonary vascularity. Tiny calcified nodular density noted in the left upper lobe, most likely tiny calcified granuloma. Mild right base subsegmental atelectasis No pleural effusion or pneumothorax . IMPRESSION: Cardiomegaly. No pulmonary venous  congestion. Mild right base subsegmental atelectasis. Electronically Signed   By: Marcello Moores  Register   On: 01/28/2017 08:00    Microbiology: Recent Results (from the past 240 hour(s))  Urine culture     Status: Abnormal   Collection Time: 01/28/17  7:13 AM  Result Value Ref Range Status   Specimen Description URINE, CATHETERIZED  Final   Special Requests NONE  Final   Culture >=100,000 COLONIES/mL ESCHERICHIA COLI (A)  Final   Report Status 01/31/2017 FINAL  Final   Organism ID, Bacteria ESCHERICHIA COLI (A)  Final      Susceptibility   Escherichia coli - MIC*     AMPICILLIN >=32 RESISTANT Resistant     CEFAZOLIN <=4 SENSITIVE Sensitive     CEFTRIAXONE <=1 SENSITIVE Sensitive     CIPROFLOXACIN <=0.25 SENSITIVE Sensitive     GENTAMICIN <=1 SENSITIVE Sensitive     IMIPENEM <=0.25 SENSITIVE Sensitive     NITROFURANTOIN <=16 SENSITIVE Sensitive     TRIMETH/SULFA >=320 RESISTANT Resistant     AMPICILLIN/SULBACTAM 16 INTERMEDIATE Intermediate     PIP/TAZO <=4 SENSITIVE Sensitive     Extended ESBL NEGATIVE Sensitive     * >=100,000 COLONIES/mL ESCHERICHIA COLI  Blood culture (routine x 2)     Status: Abnormal   Collection Time: 01/28/17  7:28 AM  Result Value Ref Range Status   Specimen Description BLOOD BOTTLES DRAWN AEROBIC AND ANAEROBIC  Final   Special Requests RIGHT ANTECUBITAL 8CC EACH  Final   Culture  Setup Time   Final    GRAM NEGATIVE RODS Gram Stain Report Called to,Read Back By and Verified With: ROBERTS,T. AT 2020 ON 01/28/2017 BY EVA IN BOTH AEROBIC AND ANAEROBIC BOTTLES Performed at [facility], READ BACK BY AND VERIFIED WITH: B. PUTTMAN PHARMD, AT 8315 01/29/17 BY Rush Landmark Performed at [facility], [place] [address], [place], [place] [address]    Culture ESCHERICHIA COLI (A)  Final   Report Status 01/31/2017 FINAL  Final   Organism ID, Bacteria ESCHERICHIA COLI  Final      Susceptibility   Escherichia coli - MIC*    AMPICILLIN >=32 RESISTANT Resistant     CEFAZOLIN <=4 SENSITIVE Sensitive     CEFEPIME <=1 SENSITIVE Sensitive     CEFTAZIDIME <=1 SENSITIVE Sensitive     CEFTRIAXONE <=1 SENSITIVE Sensitive     CIPROFLOXACIN <=0.25 SENSITIVE Sensitive     GENTAMICIN <=1 SENSITIVE Sensitive     IMIPENEM <=0.25 SENSITIVE Sensitive     TRIMETH/SULFA >=320 RESISTANT Resistant     AMPICILLIN/SULBACTAM >=32 RESISTANT Resistant     PIP/TAZO <=4 SENSITIVE Sensitive     Extended ESBL NEGATIVE Sensitive     * ESCHERICHIA COLI  Blood Culture ID Panel (Reflexed)     Status: Abnormal   Collection Time:  01/28/17  7:28 AM  Result Value Ref Range Status   Enterococcus species NOT DETECTED NOT DETECTED Final   Listeria monocytogenes NOT DETECTED NOT DETECTED Final   Staphylococcus species NOT DETECTED NOT DETECTED Final   Staphylococcus aureus NOT DETECTED NOT DETECTED Final   Streptococcus species NOT DETECTED NOT DETECTED Final   Streptococcus agalactiae NOT DETECTED NOT DETECTED Final   Streptococcus pneumoniae NOT DETECTED NOT DETECTED Final   Streptococcus pyogenes NOT DETECTED NOT DETECTED Final   Acinetobacter baumannii NOT DETECTED NOT DETECTED Final   Enterobacteriaceae species DETECTED (A) NOT DETECTED Final    Comment: Enterobacteriaceae represent a large family of gram-negative bacteria, not a single  organism. CRITICAL RESULT CALLED TO, READ BACK BY AND VERIFIED WITH: B. PUTTMAN PHARMD, AT [phone] 01/29/17 BY D. VANHOOK    Enterobacter cloacae complex NOT DETECTED NOT DETECTED Final   Escherichia coli DETECTED (A) NOT DETECTED Final    Comment: CRITICAL RESULT CALLED TO, READ BACK BY AND VERIFIED WITH: B. PUTTMAN PHARMD, AT 8756 01/29/17 BY D. VANHOOK    Klebsiella oxytoca NOT DETECTED NOT DETECTED Final   Klebsiella pneumoniae NOT DETECTED NOT DETECTED Final   Proteus species NOT DETECTED NOT DETECTED Final   Serratia marcescens NOT DETECTED NOT DETECTED Final   Carbapenem resistance NOT DETECTED NOT DETECTED Final   Haemophilus influenzae NOT DETECTED NOT DETECTED Final   Neisseria meningitidis NOT DETECTED NOT DETECTED Final   Pseudomonas aeruginosa NOT DETECTED NOT DETECTED Final   Candida albicans NOT DETECTED NOT DETECTED Final   Candida glabrata NOT DETECTED NOT DETECTED Final   Candida krusei NOT DETECTED NOT DETECTED Final   Candida parapsilosis NOT DETECTED NOT DETECTED Final   Candida tropicalis NOT DETECTED NOT DETECTED Final    Comment: Performed at [facility], [place] [address], [place], [place] [address]  Blood culture (routine x 2)     Status: None  (Preliminary result)   Collection Time: 01/28/17  7:33 AM  Result Value Ref Range Status   Specimen Description BLOOD BOTTLES DRAWN AEROBIC AND ANAEROBIC  Final   Special Requests RIGHT ANTECUBITAL 8 CC EACH  Final   Culture NO GROWTH 4 DAYS  Final   Report Status PENDING  Incomplete  MRSA PCR Screening     Status: None   Collection Time: 01/28/17  1:45 PM  Result Value Ref Range Status   MRSA by PCR NEGATIVE NEGATIVE Final    Comment:        The GeneXpert MRSA Assay (FDA approved for NASAL specimens only), is one component of a comprehensive MRSA colonization surveillance program. It is not intended to diagnose MRSA infection nor to guide or monitor treatment for MRSA infections.      Labs: Basic Metabolic Panel:  Recent Labs Lab 01/28/17 0909 01/29/17 0503 01/30/17 0900  NA 143 142 141  K 3.0* 3.6 3.6  CL 111 112* 114*  CO2 25 23 22   GLUCOSE 110* 85 142*  BUN 23* 20 27*  CREATININE 1.89* 1.25* 1.05*  CALCIUM 7.9* 7.5* 7.2*   Liver Function Tests:  Recent Labs Lab 01/28/17 0909  AST 50*  ALT 21  ALKPHOS 51  BILITOT 0.6  PROT 5.7*  ALBUMIN 3.0*   CBC:  Recent Labs Lab 01/28/17 0726 01/29/17 0503 01/30/17 0900 02/01/17 0611  WBC 25.3* 26.4* 22.8* 11.9*  NEUTROABS 23.0*  --   --   --   HGB 10.6* 9.8* 9.1* 8.8*  HCT 33.8* 30.8* 28.3* 27.4*  MCV 83.7 82.8 82.3 81.8  PLT 281 219 211 250   Cardiac Enzymes:  Recent Labs Lab 01/28/17 0909 01/29/17 0503 01/29/17 0953 01/29/17 1638 01/30/17 0534 01/30/17 0900  CKTOTAL 2,218* 2,295*  --   --  736*  --   TROPONINI 0.11*  --  0.31* 0.23*  --  0.08*    Principal Problem:   Sepsis due to Escherichia coli (E. coli) (HCC) Active Problems:   COPD with acute exacerbation (HCC)   E. coli UTI   Rhabdomyolysis   Dementia   E coli bacteremia   Demand ischemia ([place])   Time coordinating discharge: 35 minutes  Signed:  Murray Hodgkins, MD [facility] 02/01/2017, 10:09 AM

## 2017-02-01 NOTE — Care Management Note (Addendum)
Case Management Note  Patient Details  Name: [name] MRN: [id number] Date of Birth: 1940-12-11  Subjective/Objective: Adm with sepsis. From home alone, has WC and RW PTA. She has home oxygen PTA. Daughter will need to bring tank from home for transport home.  Has good family support. She has PCP, daughter drives her to appointments, and reports no issues affording medications. PT recommends HH PT.                  Action/Plan: Offered choice of Home health agencies. Juliann Pulse of [facility] notified and will obtain orders from chart. Patient aware [facility] has 48 hours to initiative services.    Expected Discharge Date:       02/01/2017           Expected Discharge Plan:  [place]  In-House Referral:     Discharge planning Services  CM Consult  Post Acute Care Choice:  Home Health Choice offered to:  Patient  DME Arranged:    DME Agency:     HH Arranged:  RN, PT [place] Agency:  [place]  Status of Service:  Completed, signed off  If discussed at [place] of Stay Meetings, dates discussed:    Additional Comments:  Iyana Topor, Chauncey [place], RN 02/01/2017, 9:57 AM

## 2017-02-01 NOTE — Care Management Important Message (Signed)
Important Message  Patient Details  Name: JATIA MUSA MRN: [id number] Date of Birth: 1941/03/29   Medicare Important Message Given:  Yes    Janes Colegrove, Chauncey Reading, RN 02/01/2017, 11:03 AM

## 2017-02-01 NOTE — Progress Notes (Signed)
Discharge instructions gone over with patient. Verbalized understanding. Printed prescriptions given to patient. IV's removed from both arms without complication. Patient tolerated procedure well.

## 2017-02-02 ENCOUNTER — Telehealth: Payer: Self-pay

## 2017-02-02 LAB — CULTURE, BLOOD (ROUTINE X 2): Culture: NO GROWTH

## 2017-02-02 NOTE — Telephone Encounter (Addendum)
Transition Care Management Follow-up Telephone Call   Date discharged? 01/04/2017   How have you been since you were released from the hospital? Patient's daughter states she is better but still a little weak. They have not yet picked up her prednisone or antibiotic but state they will pick it up tomorrow for patient to start taking.   Do you understand why you were in the hospital? yes   Do you understand the discharge instructions? yes   Where were you discharged to? Home   Items Reviewed:  Medications reviewed: yes  Allergies reviewed: yes  Dietary changes reviewed: yes  Referrals reviewed: yes   Functional Questionnaire:   Activities of Daily Living (ADLs):   She states they are independent in the following: feeding, continence, grooming, toileting and dressing States they require assistance with the following: ambulation and bathing and hygiene   Any transportation issues/concerns?: no   Any patient concerns? no   Confirmed importance and date/time of follow-up visits scheduled yes  Provider Appointment booked with Dr. Moshe Cipro on 02/14/2017 at 11:00 am. Patient's daughter could not bring her in any sooner.  Confirmed with patient if condition begins to worsen call PCP or go to the ER.  Patient was given the office number and encouraged to call back with question or concerns.  : yes

## 2017-02-03 DIAGNOSIS — F0390 Unspecified dementia without behavioral disturbance: Secondary | ICD-10-CM | POA: Diagnosis not present

## 2017-02-03 DIAGNOSIS — J9621 Acute and chronic respiratory failure with hypoxia: Secondary | ICD-10-CM | POA: Diagnosis not present

## 2017-02-03 DIAGNOSIS — B9620 Unspecified Escherichia coli [E. coli] as the cause of diseases classified elsewhere: Secondary | ICD-10-CM | POA: Diagnosis not present

## 2017-02-03 DIAGNOSIS — J441 Chronic obstructive pulmonary disease with (acute) exacerbation: Secondary | ICD-10-CM | POA: Diagnosis not present

## 2017-02-03 DIAGNOSIS — N390 Urinary tract infection, site not specified: Secondary | ICD-10-CM | POA: Diagnosis not present

## 2017-02-03 DIAGNOSIS — M6282 Rhabdomyolysis: Secondary | ICD-10-CM | POA: Diagnosis not present

## 2017-02-03 DIAGNOSIS — I10 Essential (primary) hypertension: Secondary | ICD-10-CM | POA: Diagnosis not present

## 2017-02-03 DIAGNOSIS — G4733 Obstructive sleep apnea (adult) (pediatric): Secondary | ICD-10-CM | POA: Diagnosis not present

## 2017-02-03 DIAGNOSIS — M5136 Other intervertebral disc degeneration, lumbar region: Secondary | ICD-10-CM | POA: Diagnosis not present

## 2017-02-07 DIAGNOSIS — J9621 Acute and chronic respiratory failure with hypoxia: Secondary | ICD-10-CM | POA: Diagnosis not present

## 2017-02-07 DIAGNOSIS — I10 Essential (primary) hypertension: Secondary | ICD-10-CM | POA: Diagnosis not present

## 2017-02-07 DIAGNOSIS — N390 Urinary tract infection, site not specified: Secondary | ICD-10-CM | POA: Diagnosis not present

## 2017-02-07 DIAGNOSIS — J441 Chronic obstructive pulmonary disease with (acute) exacerbation: Secondary | ICD-10-CM | POA: Diagnosis not present

## 2017-02-07 DIAGNOSIS — F0390 Unspecified dementia without behavioral disturbance: Secondary | ICD-10-CM | POA: Diagnosis not present

## 2017-02-07 DIAGNOSIS — B9620 Unspecified Escherichia coli [E. coli] as the cause of diseases classified elsewhere: Secondary | ICD-10-CM | POA: Diagnosis not present

## 2017-02-07 DIAGNOSIS — G4733 Obstructive sleep apnea (adult) (pediatric): Secondary | ICD-10-CM | POA: Diagnosis not present

## 2017-02-07 DIAGNOSIS — M6282 Rhabdomyolysis: Secondary | ICD-10-CM | POA: Diagnosis not present

## 2017-02-07 DIAGNOSIS — M5136 Other intervertebral disc degeneration, lumbar region: Secondary | ICD-10-CM | POA: Diagnosis not present

## 2017-02-08 DIAGNOSIS — J9621 Acute and chronic respiratory failure with hypoxia: Secondary | ICD-10-CM | POA: Diagnosis not present

## 2017-02-08 DIAGNOSIS — J441 Chronic obstructive pulmonary disease with (acute) exacerbation: Secondary | ICD-10-CM | POA: Diagnosis not present

## 2017-02-08 DIAGNOSIS — N390 Urinary tract infection, site not specified: Secondary | ICD-10-CM | POA: Diagnosis not present

## 2017-02-08 DIAGNOSIS — G4733 Obstructive sleep apnea (adult) (pediatric): Secondary | ICD-10-CM | POA: Diagnosis not present

## 2017-02-08 DIAGNOSIS — M5136 Other intervertebral disc degeneration, lumbar region: Secondary | ICD-10-CM | POA: Diagnosis not present

## 2017-02-08 DIAGNOSIS — M6282 Rhabdomyolysis: Secondary | ICD-10-CM | POA: Diagnosis not present

## 2017-02-08 DIAGNOSIS — I10 Essential (primary) hypertension: Secondary | ICD-10-CM | POA: Diagnosis not present

## 2017-02-08 DIAGNOSIS — F0390 Unspecified dementia without behavioral disturbance: Secondary | ICD-10-CM | POA: Diagnosis not present

## 2017-02-08 DIAGNOSIS — B9620 Unspecified Escherichia coli [E. coli] as the cause of diseases classified elsewhere: Secondary | ICD-10-CM | POA: Diagnosis not present

## 2017-02-09 DIAGNOSIS — J449 Chronic obstructive pulmonary disease, unspecified: Secondary | ICD-10-CM | POA: Diagnosis not present

## 2017-02-10 DIAGNOSIS — N390 Urinary tract infection, site not specified: Secondary | ICD-10-CM | POA: Diagnosis not present

## 2017-02-10 DIAGNOSIS — J441 Chronic obstructive pulmonary disease with (acute) exacerbation: Secondary | ICD-10-CM | POA: Diagnosis not present

## 2017-02-10 DIAGNOSIS — G4733 Obstructive sleep apnea (adult) (pediatric): Secondary | ICD-10-CM | POA: Diagnosis not present

## 2017-02-10 DIAGNOSIS — M6282 Rhabdomyolysis: Secondary | ICD-10-CM | POA: Diagnosis not present

## 2017-02-10 DIAGNOSIS — B9620 Unspecified Escherichia coli [E. coli] as the cause of diseases classified elsewhere: Secondary | ICD-10-CM | POA: Diagnosis not present

## 2017-02-10 DIAGNOSIS — J9621 Acute and chronic respiratory failure with hypoxia: Secondary | ICD-10-CM | POA: Diagnosis not present

## 2017-02-10 DIAGNOSIS — I10 Essential (primary) hypertension: Secondary | ICD-10-CM | POA: Diagnosis not present

## 2017-02-10 DIAGNOSIS — M5136 Other intervertebral disc degeneration, lumbar region: Secondary | ICD-10-CM | POA: Diagnosis not present

## 2017-02-10 DIAGNOSIS — F0390 Unspecified dementia without behavioral disturbance: Secondary | ICD-10-CM | POA: Diagnosis not present

## 2017-02-14 ENCOUNTER — Other Ambulatory Visit: Payer: Self-pay | Admitting: Family Medicine

## 2017-02-14 ENCOUNTER — Ambulatory Visit (INDEPENDENT_AMBULATORY_CARE_PROVIDER_SITE_OTHER): Payer: Medicare PPO | Admitting: Family Medicine

## 2017-02-14 VITALS — BP 120/74 | HR 102 | Resp 16 | Ht 73.0 in | Wt 207.0 lb

## 2017-02-14 DIAGNOSIS — J9621 Acute and chronic respiratory failure with hypoxia: Secondary | ICD-10-CM | POA: Diagnosis not present

## 2017-02-14 DIAGNOSIS — I10 Essential (primary) hypertension: Secondary | ICD-10-CM

## 2017-02-14 DIAGNOSIS — N390 Urinary tract infection, site not specified: Secondary | ICD-10-CM

## 2017-02-14 DIAGNOSIS — E785 Hyperlipidemia, unspecified: Secondary | ICD-10-CM | POA: Diagnosis not present

## 2017-02-14 DIAGNOSIS — Z09 Encounter for follow-up examination after completed treatment for conditions other than malignant neoplasm: Secondary | ICD-10-CM | POA: Diagnosis not present

## 2017-02-14 DIAGNOSIS — B9620 Unspecified Escherichia coli [E. coli] as the cause of diseases classified elsewhere: Secondary | ICD-10-CM | POA: Diagnosis not present

## 2017-02-14 DIAGNOSIS — A4151 Sepsis due to Escherichia coli [E. coli]: Secondary | ICD-10-CM

## 2017-02-14 DIAGNOSIS — G8929 Other chronic pain: Secondary | ICD-10-CM

## 2017-02-14 DIAGNOSIS — F0390 Unspecified dementia without behavioral disturbance: Secondary | ICD-10-CM

## 2017-02-14 DIAGNOSIS — E559 Vitamin D deficiency, unspecified: Secondary | ICD-10-CM | POA: Diagnosis not present

## 2017-02-14 DIAGNOSIS — G4733 Obstructive sleep apnea (adult) (pediatric): Secondary | ICD-10-CM | POA: Diagnosis not present

## 2017-02-14 DIAGNOSIS — J441 Chronic obstructive pulmonary disease with (acute) exacerbation: Secondary | ICD-10-CM | POA: Diagnosis not present

## 2017-02-14 DIAGNOSIS — M5136 Other intervertebral disc degeneration, lumbar region: Secondary | ICD-10-CM | POA: Diagnosis not present

## 2017-02-14 DIAGNOSIS — R269 Unspecified abnormalities of gait and mobility: Secondary | ICD-10-CM

## 2017-02-14 DIAGNOSIS — M25511 Pain in right shoulder: Secondary | ICD-10-CM

## 2017-02-14 DIAGNOSIS — M6282 Rhabdomyolysis: Secondary | ICD-10-CM | POA: Diagnosis not present

## 2017-02-14 LAB — COMPREHENSIVE METABOLIC PANEL
ALBUMIN: 3.7 g/dL (ref 3.6–5.1)
ALK PHOS: 74 U/L (ref 33–130)
ALT: 14 U/L (ref 6–29)
AST: 15 U/L (ref 10–35)
BILIRUBIN TOTAL: 0.4 mg/dL (ref 0.2–1.2)
BUN: 20 mg/dL (ref 7–25)
CALCIUM: 9.1 mg/dL (ref 8.6–10.4)
CO2: 32 mmol/L — ABNORMAL HIGH (ref 20–31)
Chloride: 104 mmol/L (ref 98–110)
Creat: 1.01 mg/dL — ABNORMAL HIGH (ref 0.60–0.93)
GLUCOSE: 74 mg/dL (ref 65–99)
Potassium: 4.7 mmol/L (ref 3.5–5.3)
Sodium: 144 mmol/L (ref 135–146)
Total Protein: 6.3 g/dL (ref 6.1–8.1)

## 2017-02-14 LAB — LIPID PANEL
CHOL/HDL RATIO: 2.3 ratio (ref <5.0)
Cholesterol: 159 mg/dL (ref <200)
HDL: 69 mg/dL (ref >50)
LDL Cholesterol: 56 mg/dL (ref <100)
TRIGLYCERIDES: 168 mg/dL — AB (ref <150)
VLDL: 34 mg/dL — ABNORMAL HIGH (ref <30)

## 2017-02-14 LAB — CBC
HEMATOCRIT: 35.7 % (ref 35.0–45.0)
HEMOGLOBIN: 10.6 g/dL — AB (ref 11.7–15.5)
MCH: 25.1 pg — ABNORMAL LOW (ref 27.0–33.0)
MCHC: 29.7 g/dL — ABNORMAL LOW (ref 32.0–36.0)
MCV: 84.6 fL (ref 80.0–100.0)
MPV: 9 fL (ref 7.5–12.5)
Platelets: 361 10*3/uL (ref 140–400)
RBC: 4.22 MIL/uL (ref 3.80–5.10)
RDW: 16.8 % — AB (ref 11.0–15.0)
WBC: 7.2 10*3/uL (ref 3.8–10.8)

## 2017-02-14 NOTE — Patient Instructions (Addendum)
f/u end August, call iof you need me sooner  Labs today, cBC, cmp and lipid  Urine sent for culture,( pt to return specimen to lab, unable to void at visit)  Careful  not to fall.  I will ask PT to help with right shoulder pain  Thank you  for choosing [facility]. We consider it a privelige to serve you.  Delivering excellent health care in a caring and  compassionate way is our goal.  Partnering with you,  so that together we can achieve this goal is our strategy.

## 2017-02-15 ENCOUNTER — Other Ambulatory Visit (HOSPITAL_COMMUNITY)
Admission: AD | Admit: 2017-02-15 | Discharge: 2017-02-15 | Disposition: A | Discharge status: Home / Self Care | Payer: Medicare PPO | Source: Skilled Nursing Facility | Attending: Family Medicine | Admitting: Family Medicine

## 2017-02-15 DIAGNOSIS — A4151 Sepsis due to Escherichia coli [E. coli]: Secondary | ICD-10-CM | POA: Insufficient documentation

## 2017-02-16 DIAGNOSIS — B9620 Unspecified Escherichia coli [E. coli] as the cause of diseases classified elsewhere: Secondary | ICD-10-CM | POA: Diagnosis not present

## 2017-02-16 DIAGNOSIS — J9621 Acute and chronic respiratory failure with hypoxia: Secondary | ICD-10-CM | POA: Diagnosis not present

## 2017-02-16 DIAGNOSIS — J441 Chronic obstructive pulmonary disease with (acute) exacerbation: Secondary | ICD-10-CM | POA: Diagnosis not present

## 2017-02-16 DIAGNOSIS — M6282 Rhabdomyolysis: Secondary | ICD-10-CM | POA: Diagnosis not present

## 2017-02-16 DIAGNOSIS — M5136 Other intervertebral disc degeneration, lumbar region: Secondary | ICD-10-CM | POA: Diagnosis not present

## 2017-02-16 DIAGNOSIS — F0390 Unspecified dementia without behavioral disturbance: Secondary | ICD-10-CM | POA: Diagnosis not present

## 2017-02-16 DIAGNOSIS — G4733 Obstructive sleep apnea (adult) (pediatric): Secondary | ICD-10-CM | POA: Diagnosis not present

## 2017-02-16 DIAGNOSIS — N390 Urinary tract infection, site not specified: Secondary | ICD-10-CM | POA: Diagnosis not present

## 2017-02-16 DIAGNOSIS — I10 Essential (primary) hypertension: Secondary | ICD-10-CM | POA: Diagnosis not present

## 2017-02-16 LAB — ANEMIA PROFILE B
%SAT: 20 % (ref 11–50)
ABS RETIC: 83000 {cells}/uL — AB (ref 20000–80000)
BASOS ABS: 74 {cells}/uL (ref 0–200)
Basophils Relative: 1 %
Eosinophils Absolute: 814 cells/uL — ABNORMAL HIGH (ref 15–500)
Eosinophils Relative: 11 %
FERRITIN: 49 ng/mL (ref 20–288)
Folate: 15.8 ng/mL (ref >5.4)
HCT: 35.8 % (ref 35.0–45.0)
Hemoglobin: 10.6 g/dL — ABNORMAL LOW (ref 11.7–15.5)
IRON: 63 ug/dL (ref 45–160)
LYMPHS PCT: 24 %
Lymphs Abs: 1776 cells/uL (ref 850–3900)
MCH: 25.5 pg — AB (ref 27.0–33.0)
MCHC: 29.6 g/dL — AB (ref 32.0–36.0)
MCV: 86.3 fL (ref 80.0–100.0)
MONO ABS: 370 {cells}/uL (ref 200–950)
MPV: 9.2 fL (ref 7.5–12.5)
Monocytes Relative: 5 %
NEUTROS PCT: 59 %
Neutro Abs: 4366 cells/uL (ref 1500–7800)
Platelets: 377 10*3/uL (ref 140–400)
RBC.: 4.15 MIL/uL (ref 3.80–5.10)
RBC: 4.15 MIL/uL (ref 3.80–5.10)
RDW: 17.7 % — AB (ref 11.0–15.0)
RETIC CT PCT: 2 %
TIBC: 321 ug/dL (ref 250–450)
VITAMIN B 12: 528 pg/mL (ref 200–1100)
WBC: 7.4 10*3/uL (ref 3.8–10.8)

## 2017-02-16 LAB — VITAMIN D 25 HYDROXY (VIT D DEFICIENCY, FRACTURES): Vit D, 25-Hydroxy: 26 ng/mL — ABNORMAL LOW (ref 30–100)

## 2017-02-17 DIAGNOSIS — B9620 Unspecified Escherichia coli [E. coli] as the cause of diseases classified elsewhere: Secondary | ICD-10-CM | POA: Diagnosis not present

## 2017-02-17 DIAGNOSIS — J9621 Acute and chronic respiratory failure with hypoxia: Secondary | ICD-10-CM | POA: Diagnosis not present

## 2017-02-17 DIAGNOSIS — N390 Urinary tract infection, site not specified: Secondary | ICD-10-CM | POA: Diagnosis not present

## 2017-02-17 DIAGNOSIS — J441 Chronic obstructive pulmonary disease with (acute) exacerbation: Secondary | ICD-10-CM | POA: Diagnosis not present

## 2017-02-17 DIAGNOSIS — J449 Chronic obstructive pulmonary disease, unspecified: Secondary | ICD-10-CM | POA: Diagnosis not present

## 2017-02-17 LAB — URINE CULTURE: Culture: <10000 — AB

## 2017-02-18 DIAGNOSIS — G4733 Obstructive sleep apnea (adult) (pediatric): Secondary | ICD-10-CM | POA: Diagnosis not present

## 2017-02-18 DIAGNOSIS — M5136 Other intervertebral disc degeneration, lumbar region: Secondary | ICD-10-CM | POA: Diagnosis not present

## 2017-02-18 DIAGNOSIS — M6282 Rhabdomyolysis: Secondary | ICD-10-CM | POA: Diagnosis not present

## 2017-02-18 DIAGNOSIS — F0390 Unspecified dementia without behavioral disturbance: Secondary | ICD-10-CM | POA: Diagnosis not present

## 2017-02-18 DIAGNOSIS — J441 Chronic obstructive pulmonary disease with (acute) exacerbation: Secondary | ICD-10-CM | POA: Diagnosis not present

## 2017-02-18 DIAGNOSIS — I10 Essential (primary) hypertension: Secondary | ICD-10-CM | POA: Diagnosis not present

## 2017-02-18 DIAGNOSIS — N390 Urinary tract infection, site not specified: Secondary | ICD-10-CM | POA: Diagnosis not present

## 2017-02-18 DIAGNOSIS — J9621 Acute and chronic respiratory failure with hypoxia: Secondary | ICD-10-CM | POA: Diagnosis not present

## 2017-02-18 DIAGNOSIS — B9620 Unspecified Escherichia coli [E. coli] as the cause of diseases classified elsewhere: Secondary | ICD-10-CM | POA: Diagnosis not present

## 2017-02-20 ENCOUNTER — Encounter: Payer: Self-pay | Admitting: Family Medicine

## 2017-02-20 NOTE — Assessment & Plan Note (Signed)
c/o increased pain and reduced mobility will refer to OT in home for managfement

## 2017-02-20 NOTE — Assessment & Plan Note (Signed)
Controlled, no change in medication  

## 2017-02-20 NOTE — Progress Notes (Signed)
   [name]     MRN: [id number]      DOB: 1941-10-16   HPI [name]  Patient in for follow up of recent hospitalization.Dx with e coli sepsis and AKI Discharge summary, and laboratory and radiology data are reviewed, and any questions or concerns about recent hospitalization are discussed. Specific issues requiring follow up are specifically addressed. Gradually regaining her strength, has PT at home but has c/o right shoulder pain and reduced mobility which is chronic, will request in home pT to  Also specifically address this issue Appetite and activity level gradually improving per accompanying daughter   ROS See HPI  Denies current fever or chills. Denies sinus pressure, nasal congestion, ear pain or sore throat. Denies chest congestion, productive cough or wheezing.chronic oxygen dependence Denies chest pains, palpitations and leg swelling Denies abdominal pain, nausea, vomiting,diarrhea or constipation.   Denies dysuria, frequency, hesitancy or incontinence. C/O chronic  joint pain,  and limitation in mobility.She is dependent on a walker for safe mobility. Now c/o increased right shoulder pain with reduced mobility and function, no recent trauma Denies headaches, seizures,  Denies depression, anxiety or insomnia. Denies skin break down or rash.   PE  BP 120/74   Pulse (!) 102   Resp 16   Ht 6\' 1"  (1.854 m)   Wt 207 lb (93.9 kg)   SpO2 92%   BMI 27.31 kg/m   Patient alert and oriented and in mild  cardiopulmonary distress. Oxygen dependent HEENT: No facial asymmetry, EOMI,   oropharynx pink and moist.  Neck decreased ROM no JVD, no mass.  Chest: Clear to auscultation bilaterally.Deceased air entry bilaterally , scattered high pitched wheezes no crackles  CVS: S1, S2 no murmurs, no S3.Regular rate.  ABD: Soft non tender.   Ext: No edema  MS: Markedly decreased  ROM spine,  And right shoulder, more so than left , also, hips and knees.  Skin:  Intact, no ulcerations or rash noted.  Psych: Good eye contact, normal affect. Memory intact not anxious or depressed appearing.  CNS: CN 2-12 intact, power,  normal throughout.no focal deficits noted.   [facility] discharge follow-up Markedly improved . Hospital summary reviewed at visit and recommended f/u labs attempted, however , she is unable to void. Daughter reports good appetite, denies fever, notes continued weakness and reduced safe mobility, in home PT a good option and she is agreeable  Abnormality of gait Increased fall risk due to severe generalized osteoarthirits, in home PT due to this as well as because of generalized weakness post discharge  Essential hypertension Controlled, no change in medication   E. coli UTI rept urine c/s ordered to ensure cure , pt to return specimen to lab, unable to void at visit  Chronic right shoulder pain c/o increased pain and reduced mobility will refer to OT in home for managfement  Dementia Stable on current med continue same  Hyperlipidemia LDL goal <100 Hyperlipidemia:Low fat diet discussed and encouraged.   Lipid Panel  Lab Results  Component Value Date   CHOL 159 02/14/2017   HDL 69 02/14/2017   LDLCALC 56 02/14/2017   TRIG 168 (H) 02/14/2017   CHOLHDL 2.3 02/14/2017   Elevated TG needs to reduce fat intake

## 2017-02-20 NOTE — Assessment & Plan Note (Signed)
rept urine c/s ordered to ensure cure , pt to return specimen to lab, unable to void at visit

## 2017-02-20 NOTE — Assessment & Plan Note (Signed)
Stable on current med continue same

## 2017-02-20 NOTE — Assessment & Plan Note (Signed)
Markedly improved . Hospital summary reviewed at visit and recommended f/u labs attempted, however , she is unable to void. Daughter reports good appetite, denies fever, notes continued weakness and reduced safe mobility, in home PT a good option and she is agreeable

## 2017-02-20 NOTE — Assessment & Plan Note (Signed)
Increased fall risk due to severe generalized osteoarthirits, in home PT due to this as well as because of generalized weakness post discharge

## 2017-02-20 NOTE — Assessment & Plan Note (Signed)
Hyperlipidemia:Low fat diet discussed and encouraged.   Lipid Panel  Lab Results  Component Value Date   CHOL 159 02/14/2017   HDL 69 02/14/2017   LDLCALC 56 02/14/2017   TRIG 168 (H) 02/14/2017   CHOLHDL 2.3 02/14/2017   Elevated TG needs to reduce fat intake

## 2017-02-21 DIAGNOSIS — B9620 Unspecified Escherichia coli [E. coli] as the cause of diseases classified elsewhere: Secondary | ICD-10-CM | POA: Diagnosis not present

## 2017-02-21 DIAGNOSIS — J9621 Acute and chronic respiratory failure with hypoxia: Secondary | ICD-10-CM | POA: Diagnosis not present

## 2017-02-21 DIAGNOSIS — M5136 Other intervertebral disc degeneration, lumbar region: Secondary | ICD-10-CM | POA: Diagnosis not present

## 2017-02-21 DIAGNOSIS — F0390 Unspecified dementia without behavioral disturbance: Secondary | ICD-10-CM | POA: Diagnosis not present

## 2017-02-21 DIAGNOSIS — G4733 Obstructive sleep apnea (adult) (pediatric): Secondary | ICD-10-CM | POA: Diagnosis not present

## 2017-02-21 DIAGNOSIS — J441 Chronic obstructive pulmonary disease with (acute) exacerbation: Secondary | ICD-10-CM | POA: Diagnosis not present

## 2017-02-21 DIAGNOSIS — I10 Essential (primary) hypertension: Secondary | ICD-10-CM | POA: Diagnosis not present

## 2017-02-21 DIAGNOSIS — N390 Urinary tract infection, site not specified: Secondary | ICD-10-CM | POA: Diagnosis not present

## 2017-02-21 DIAGNOSIS — M6282 Rhabdomyolysis: Secondary | ICD-10-CM | POA: Diagnosis not present

## 2017-02-24 ENCOUNTER — Ambulatory Visit: Payer: Medicare PPO

## 2017-02-24 DIAGNOSIS — G4733 Obstructive sleep apnea (adult) (pediatric): Secondary | ICD-10-CM | POA: Diagnosis not present

## 2017-02-24 DIAGNOSIS — M6282 Rhabdomyolysis: Secondary | ICD-10-CM | POA: Diagnosis not present

## 2017-02-24 DIAGNOSIS — I10 Essential (primary) hypertension: Secondary | ICD-10-CM | POA: Diagnosis not present

## 2017-02-24 DIAGNOSIS — F0390 Unspecified dementia without behavioral disturbance: Secondary | ICD-10-CM | POA: Diagnosis not present

## 2017-02-24 DIAGNOSIS — M5136 Other intervertebral disc degeneration, lumbar region: Secondary | ICD-10-CM | POA: Diagnosis not present

## 2017-02-24 DIAGNOSIS — B9620 Unspecified Escherichia coli [E. coli] as the cause of diseases classified elsewhere: Secondary | ICD-10-CM | POA: Diagnosis not present

## 2017-02-24 DIAGNOSIS — N390 Urinary tract infection, site not specified: Secondary | ICD-10-CM | POA: Diagnosis not present

## 2017-02-24 DIAGNOSIS — J441 Chronic obstructive pulmonary disease with (acute) exacerbation: Secondary | ICD-10-CM | POA: Diagnosis not present

## 2017-02-24 DIAGNOSIS — J9621 Acute and chronic respiratory failure with hypoxia: Secondary | ICD-10-CM | POA: Diagnosis not present

## 2017-03-01 DIAGNOSIS — M5136 Other intervertebral disc degeneration, lumbar region: Secondary | ICD-10-CM | POA: Diagnosis not present

## 2017-03-01 DIAGNOSIS — I10 Essential (primary) hypertension: Secondary | ICD-10-CM | POA: Diagnosis not present

## 2017-03-01 DIAGNOSIS — B9620 Unspecified Escherichia coli [E. coli] as the cause of diseases classified elsewhere: Secondary | ICD-10-CM | POA: Diagnosis not present

## 2017-03-01 DIAGNOSIS — G4733 Obstructive sleep apnea (adult) (pediatric): Secondary | ICD-10-CM | POA: Diagnosis not present

## 2017-03-01 DIAGNOSIS — J9621 Acute and chronic respiratory failure with hypoxia: Secondary | ICD-10-CM | POA: Diagnosis not present

## 2017-03-01 DIAGNOSIS — J441 Chronic obstructive pulmonary disease with (acute) exacerbation: Secondary | ICD-10-CM | POA: Diagnosis not present

## 2017-03-01 DIAGNOSIS — M6282 Rhabdomyolysis: Secondary | ICD-10-CM | POA: Diagnosis not present

## 2017-03-01 DIAGNOSIS — F0390 Unspecified dementia without behavioral disturbance: Secondary | ICD-10-CM | POA: Diagnosis not present

## 2017-03-01 DIAGNOSIS — N390 Urinary tract infection, site not specified: Secondary | ICD-10-CM | POA: Diagnosis not present

## 2017-03-09 IMAGING — CT CT ANGIO CHEST
2 of 6 series · 6 of 36 positions shown · IV contrast (Omnipaque 300)
Comparison: Radiograph dated 01/15/2016

CLINICAL DATA: 74-year-old female with shortness of breath. Recent
ankle surgery for fracture.

EXAM:
CT ANGIOGRAPHY CHEST WITH CONTRAST
TECHNIQUE: Multidetector CT imaging of the chest was performed using the
standard protocol during bolus administration of intravenous
contrast. Multiplanar CT image reconstructions and MIPs were
obtained to evaluate the vascular anatomy.
CONTRAST:  100mL OMNIPAQUE IOHEXOL 350 MG/ML SOLN

[Series 4: pe 3.0 b40f · axial · 0.73mm/px · z∈[-192,-36]mm · 5 of 80 slices shown]
[im 14/80  lung]
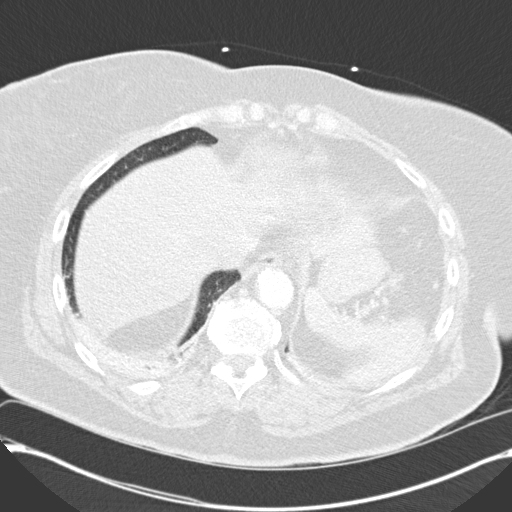
[im 27/80  mediastinal]
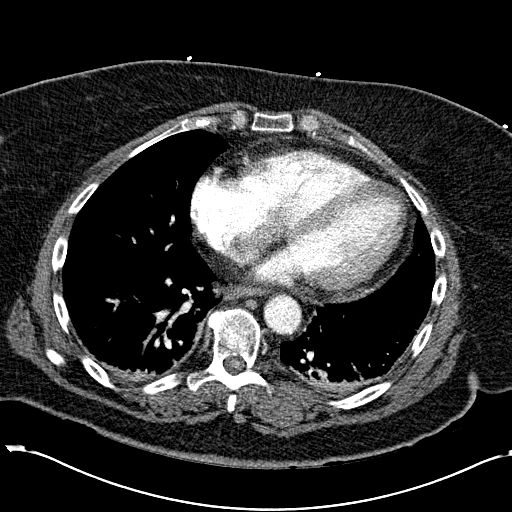
[im 40/80  lung]
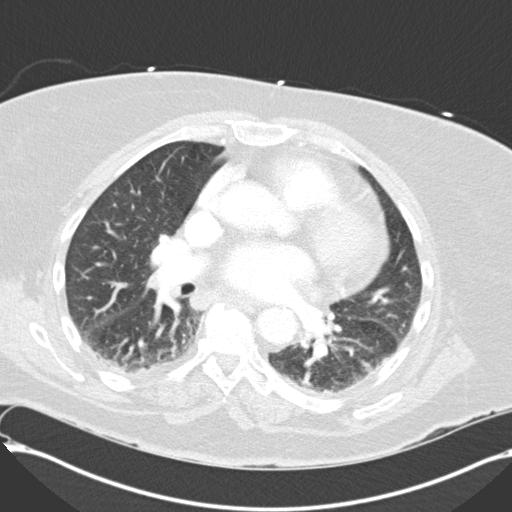
[im 53/80  mediastinal]
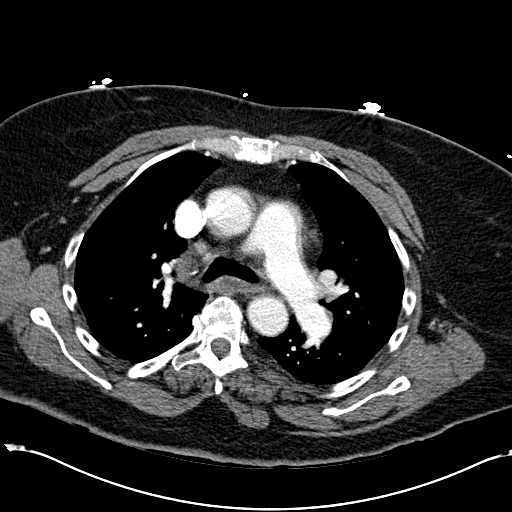
[im 66/80  lung]
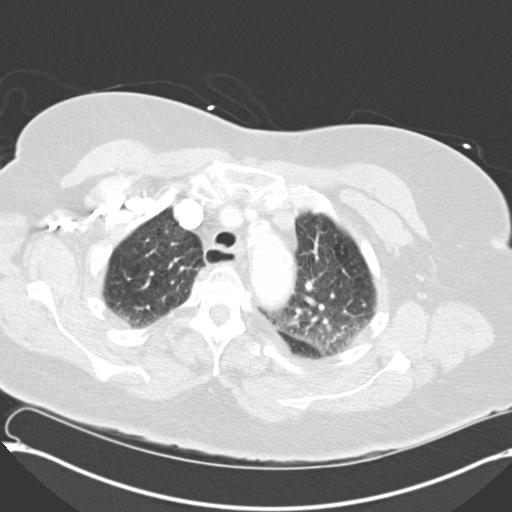

[Series 6: mpr coronal pe 3mm · coronal · 0.47mm/px · 1 of 86 slices shown]
[im 43/86  mediastinal]
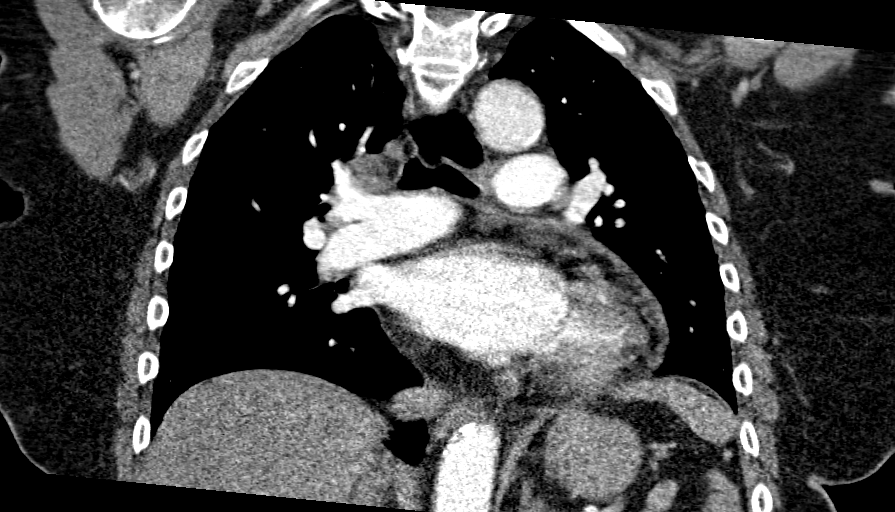

[6 of 36 positions shown; findings below may reference images not displayed]

FINDINGS: There is mild emphysematous changes of the lungs. Bibasilar
subpleural subsegmental atelectatic changes noted. There is no
pleural effusion or pneumothorax. The central airways are patent.

There is atherosclerotic calcification of the thoracic aorta. No CT
evidence of pulmonary embolism. Mild cardiomegaly. No pericardial
effusion. There is coronary vascular calcification. There are
bilateral hilar adenopathy. Small amount of fluid noted within the
esophagus.

There is no axillary adenopathy. The chest wall soft tissues appear
unremarkable. There is degenerative changes of the spine. No acute
fracture. The visualized upper abdomen appears unremarkable.

Review of the MIP images confirms the above findings.
IMPRESSION: No CT evidence of pulmonary embolism.

Bibasilar subsegmental atelectatic changes. Pneumonia is less
likely.

Mildly enlarged bilateral hilar lymph nodes may be reactive.
Clinical correlation is recommended.

## 2017-03-10 ENCOUNTER — Ambulatory Visit (INDEPENDENT_AMBULATORY_CARE_PROVIDER_SITE_OTHER): Payer: Medicare PPO

## 2017-03-10 VITALS — BP 120/74 | HR 81 | Temp 97.8°F | Ht 73.0 in | Wt 205.0 lb

## 2017-03-10 DIAGNOSIS — Z0000 Encounter for general adult medical examination without abnormal findings: Secondary | ICD-10-CM

## 2017-03-10 NOTE — Patient Instructions (Addendum)
[name] , Thank you for taking time to come for your Medicare Wellness Visit. I appreciate your ongoing commitment to your health goals. Please review the following plan we discussed and let me know if I can assist you in the future.   Screening recommendations/referrals: Colonoscopy: Up to date, next due 02/2023 Mammogram: Up to date, next due 06/2017 Bone Density: Up to date Recommended yearly ophthalmology/optometry visit for glaucoma screening and checkup Recommended yearly dental visit for hygiene and checkup  Vaccinations: Influenza vaccine: Up to date, next due 06/2017 Pneumococcal vaccine: Up to date Tdap vaccine: Up to date, next due 08/2020 Shingles vaccine: Up to date    Advanced directives:Please bring a copy of your POA (Power of Point View) and/or Living Will to your next appointment.   Conditions/risks identified: Pre-obese, recommend starting a routine exercise program at least 3 days a week for 30-45 minutes at a time as tolerated.   Next appointment: Follow up in 4 months with Dr. Moshe Cipro for your complete physical. Follow up in 1 year for your annual wellness visit.   Preventive Care 76 Years and Older, Female Preventive care refers to lifestyle choices and visits with your health care provider that can promote health and wellness. What does preventive care include?  A yearly physical exam. This is also called an annual well check.  Dental exams once or twice a year.  Routine eye exams. Ask your health care provider how often you should have your eyes checked.  Personal lifestyle choices, including:  Daily care of your teeth and gums.  Regular physical activity.  Eating a healthy diet.  Avoiding tobacco and drug use.  Limiting alcohol use.  Practicing safe sex.  Taking low-dose aspirin every day.  Taking vitamin and mineral supplements as recommended by your health care provider. What happens during an annual well check? The services and  screenings done by your health care provider during your annual well check will depend on your age, overall health, lifestyle risk factors, and family history of disease. Counseling  Your health care provider may ask you questions about your:  Alcohol use.  Tobacco use.  Drug use.  Emotional well-being.  Home and relationship well-being.  Sexual activity.  Eating habits.  History of falls.  Memory and ability to understand (cognition).  Work and work Statistician.  Reproductive health. Screening  You may have the following tests or measurements:  Height, weight, and BMI.  Blood pressure.  Lipid and cholesterol levels. These may be checked every 5 years, or more frequently if you are over 72 years old.  Skin check.  Lung cancer screening. You may have this screening every year starting at age 76 if you have a 30-pack-year history of smoking and currently smoke or have quit within the past 15 years.  Fecal occult blood test (FOBT) of the stool. You may have this test every year starting at age 76.  Flexible sigmoidoscopy or colonoscopy. You may have a sigmoidoscopy every 5 years or a colonoscopy every 10 years starting at age 33.  Hepatitis C blood test.  Hepatitis B blood test.  Sexually transmitted disease (STD) testing.  Diabetes screening. This is done by checking your blood sugar (glucose) after you have not eaten for a while (fasting). You may have this done every 1-3 years.  Bone density scan. This is done to screen for osteoporosis. You may have this done starting at age 76.  Mammogram. This may be done every 1-2 years. Talk to your health care  provider about how often you should have regular mammograms. Talk with your health care provider about your test results, treatment options, and if necessary, the need for more tests. Vaccines  Your health care provider may recommend certain vaccines, such as:  Influenza vaccine. This is recommended every  year.  Tetanus, diphtheria, and acellular pertussis (Tdap, Td) vaccine. You may need a Td booster every 10 years.  Zoster vaccine. You may need this after age 45.  Pneumococcal 13-valent conjugate (PCV13) vaccine. One dose is recommended after age 76.  Pneumococcal polysaccharide (PPSV23) vaccine. One dose is recommended after age 76. Talk to your health care provider about which screenings and vaccines you need and how often you need them. This information is not intended to replace advice given to you by your health care provider. Make sure you discuss any questions you have with your health care provider. Document Released: 11/28/2015 Document Revised: 07/21/2016 Document Reviewed: 09/02/2015 Elsevier Interactive Patient Education  2017 [employer] Prevention in the Home Falls can cause injuries. They can happen to people of all ages. There are many things you can do to make your home safe and to help prevent falls. What can I do on the outside of my home?  Regularly fix the edges of walkways and driveways and fix any cracks.  Remove anything that might make you trip as you walk through a door, such as a raised step or threshold.  Trim any bushes or trees on the path to your home.  Use bright outdoor lighting.  Clear any walking paths of anything that might make someone trip, such as rocks or tools.  Regularly check to see if handrails are loose or broken. Make sure that both sides of any steps have handrails.  Any raised decks and porches should have guardrails on the edges.  Have any leaves, snow, or ice cleared regularly.  Use sand or salt on walking paths during winter.  Clean up any spills in your garage right away. This includes oil or grease spills. What can I do in the bathroom?  Use night lights.  Install grab bars by the toilet and in the tub and shower. Do not use towel bars as grab bars.  Use non-skid mats or decals in the tub or shower.  If you  need to sit down in the shower, use a plastic, non-slip stool.  Keep the floor dry. Clean up any water that spills on the floor as soon as it happens.  Remove soap buildup in the tub or shower regularly.  Attach bath mats securely with double-sided non-slip rug tape.  Do not have throw rugs and other things on the floor that can make you trip. What can I do in the bedroom?  Use night lights.  Make sure that you have a light by your bed that is easy to reach.  Do not use any sheets or blankets that are too big for your bed. They should not hang down onto the floor.  Have a firm chair that has side arms. You can use this for support while you get dressed.  Do not have throw rugs and other things on the floor that can make you trip. What can I do in the kitchen?  Clean up any spills right away.  Avoid walking on wet floors.  Keep items that you use a lot in easy-to-reach places.  If you need to reach something above you, use a strong step stool that has a grab bar.  Keep electrical cords out of the way.  Do not use floor polish or wax that makes floors slippery. If you must use wax, use non-skid floor wax.  Do not have throw rugs and other things on the floor that can make you trip. What can I do with my stairs?  Do not leave any items on the stairs.  Make sure that there are handrails on both sides of the stairs and use them. Fix handrails that are broken or loose. Make sure that handrails are as long as the stairways.  Check any carpeting to make sure that it is firmly attached to the stairs. Fix any carpet that is loose or worn.  Avoid having throw rugs at the top or bottom of the stairs. If you do have throw rugs, attach them to the floor with carpet tape.  Make sure that you have a light switch at the top of the stairs and the bottom of the stairs. If you do not have them, ask someone to add them for you. What else can I do to help prevent falls?  Wear shoes  that:  Do not have high heels.  Have rubber bottoms.  Are comfortable and fit you well.  Are closed at the toe. Do not wear sandals.  If you use a stepladder:  Make sure that it is fully opened. Do not climb a closed stepladder.  Make sure that both sides of the stepladder are locked into place.  Ask someone to hold it for you, if possible.  Clearly mark and make sure that you can see:  Any grab bars or handrails.  First and last steps.  Where the edge of each step is.  Use tools that help you move around (mobility aids) if they are needed. These include:  Canes.  Walkers.  Scooters.  Crutches.  Turn on the lights when you go into a dark area. Replace any light bulbs as soon as they burn out.  Set up your furniture so you have a clear path. Avoid moving your furniture around.  If any of your floors are uneven, fix them.  If there are any pets around you, be aware of where they are.  Review your medicines with your doctor. Some medicines can make you feel dizzy. This can increase your chance of falling. Ask your doctor what other things that you can do to help prevent falls. This information is not intended to replace advice given to you by your health care provider. Make sure you discuss any questions you have with your health care provider. Document Released: 08/28/2009 Document Revised: 04/08/2016 Document Reviewed: 12/06/2014 Elsevier Interactive Patient Education  2017 [employer].

## 2017-03-10 NOTE — Progress Notes (Signed)
Subjective:   [name] is a 76 y.o. female who presents for Medicare Annual (Subsequent) preventive examination.  Review of Systems:  Cardiac Risk Factors include: advanced age (>53men, >52 women);dyslipidemia;hypertension;sedentary lifestyle     Objective:     Vitals: BP 120/74   Pulse 81   Temp 97.8 F (36.6 C) (Oral)   Ht 6\' 1"  (1.854 m)   Wt 205 lb (93 kg)   SpO2 96% Comment: on 2L via [place]  BMI 27.05 kg/m   Body mass index is 27.05 kg/m.   Tobacco History  Smoking Status  . Former Smoker  . Packs/day: 1.50  . Years: 40.00  . Types: Cigarettes  . Quit date: 06/21/2006  Smokeless Tobacco  . Never Used     Counseling given: Not Answered   Past Medical History:  Diagnosis Date  . Abnormality of gait 06/06/2014  . Allergic rhinitis   . Anemia   . Ankle fracture, right   . Anxiety   . Arm fracture, left   . Arthritis    abnormal gait   . COPD (chronic obstructive pulmonary disease) (HCC)    chronic CO2 retention, decreased DLCO   . Cystic acne    adult   . 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 Degenerative disc disease, lumbar    L5 nerve impingement   . Depression   . DNR (do not resuscitate)   . GERD (gastroesophageal reflux disease)   . History of colon surgery    right hemicolectomy for high-grade adenomas in right colon 2013  . Hyperglycemia   . Hypertension   . Low back pain   . Mediastinal lymphadenopathy 1/9   resolving   . Memory difficulty 09/20/2014   mild dementia  . Onychomycosis   . OSA on CPAP   . Sleep apnea    stop bang score 6   Past Surgical History:  Procedure Laterality Date  . ABDOMINAL HYSTERECTOMY  1976   secondary to bleeding   . APPENDECTOMY    . COLON RESECTION  08/18/2012   Procedure: HAND ASSISTED LAPAROSCOPIC COLON RESECTION;  Surgeon: Jamesetta So, MD;  Location: [facility];  Service: General;  Laterality: N/A;  . COLONOSCOPY  07/20/2012   Dr. Gala Romney: multiple colonic polyps with large  polyps on right side, s/p saline-assisted debulking piecemeal polypectomy and ablation. Not all removed. Path with tubulovillous adenomas, high grade dysplasia.   . COLONOSCOPY N/A 03/09/2013   ZCH:YIFOYDX polyps-tubular adenomas. S/p right hemicolectomy. next tcs 02/2018  . epidural steroids    . ESOPHAGOGASTRODUODENOSCOPY (EGD) WITH ESOPHAGEAL DILATION N/A 02/06/2014   Procedure: ESOPHAGOGASTRODUODENOSCOPY (EGD) WITH ESOPHAGEAL DILATION;  Surgeon: Daneil Dolin, MD;  Location: [facility];  Service: Endoscopy;  Laterality: N/A;  9:30  . OOPHORECTOMY  1976  . ORIF ANKLE FRACTURE  01/18/2012   Procedure: OPEN REDUCTION INTERNAL FIXATION (ORIF) ANKLE FRACTURE;  Surgeon: Arther Abbott, MD;  Location: [facility];  Service: Orthopedics;  Laterality: Left;  . ORIF ANKLE FRACTURE Right 01/13/2016   Procedure: OPEN REDUCTION INTERNAL FIXATION (ORIF) ANKLE FRACTURE;  Surgeon: Carole Civil, MD;  Location: [facility];  Service: Orthopedics;  Laterality: Right;   Family History  Problem Relation Age of Onset  . Stomach cancer Father   . Cancer Father   . Cancer Mother     brain   . Breast cancer Mother   . Arthritis     History  Sexual Activity  . Sexual activity: No  Outpatient Encounter Prescriptions as of 03/10/2017  Medication Sig  . acetaminophen (TYLENOL) 500 MG tablet Take 1,000 mg by mouth 2 (two) times daily.   . Albuterol Sulfate (PROAIR RESPICLICK) 751 (90 BASE) MCG/ACT AEPB Inhale 2 puffs into the lungs every 8 (eight) hours as needed.  [name] alendronate (FOSAMAX) 70 MG tablet Take 70 mg by mouth once a week. Take with a full glass of water on an empty stomach.  [name] arformoterol (BROVANA) 15 MCG/2ML NEBU Take 2 mLs (15 mcg total) by nebulization 2 (two) times daily.  [name] aspirin EC 81 MG tablet Take 81 mg by mouth daily.  [name] atorvastatin (LIPITOR) 10 MG tablet TAKE 1 TABLET EVERY DAY  . calcium-vitamin D (OSCAL WITH D) 500-200 MG-UNIT per tablet Take 1 tablet by mouth daily.   [name] donepezil  (ARICEPT) 10 MG tablet TAKE 1 TABLET AT BEDTIME  (NOTE: DOSE INCREASE AS OF 08/12/15)  . ENSURE (ENSURE) Take 1 Can by mouth daily as needed. Take one by mouth with meals   . FLUoxetine (PROZAC) 40 MG capsule TAKE 1 CAPSULE EVERY DAY  . loratadine (CLARITIN) 10 MG tablet Take 10 mg by mouth daily as needed for allergies.   . Multiple Vitamins-Minerals (CEROVITE SENIOR) TABS Take 1 tablet by mouth daily.   [name] omeprazole (PRILOSEC) 40 MG capsule TAKE 1 CAPSULE EVERY DAY  . verapamil (CALAN-SR) 240 MG CR tablet TAKE 1 TABLET AT BEDTIME  . [DISCONTINUED] predniSONE (DELTASONE) 10 MG tablet Take daily by mouth: 40 mg x3 days, then 20 mg x3 days, then 10 mg x3 days, then stop. (Patient not taking: Reported on 02/02/2017)   No facility-administered encounter medications on file as of 03/10/2017.     Activities of Daily Living In your present state of health, do you have any difficulty performing the following activities: 03/10/2017 01/28/2017  Hearing? N N  Vision? N N  Difficulty concentrating or making decisions? Tempie Donning  Walking or climbing stairs? Y Y  Dressing or bathing? Y N  Doing errands, shopping? Tempie Donning  Preparing Food and eating ? Y -  Using the Toilet? N -  In the past six months, have you accidently leaked urine? Y -  Do you have problems with loss of bowel control? Y -  Managing your Medications? Y -  Managing your Finances? Y -  Housekeeping or managing your Housekeeping? Y -  Some recent data might be hidden    Patient Care Team: [name], MD as PCP - General Josue Hector, MD as Attending Physician (Cardiology) Kathrynn Ducking, MD as Consulting Physician (Neurology) Sinda Du, MD as Consulting Physician (Pulmonary Disease) Carole Civil, MD as Consulting Physician (Orthopedic Surgery)    Assessment:    Exercise Activities and Dietary recommendations Current Exercise Habits: The patient does not participate in regular exercise at present, Exercise limited by:  None identified  Goals    . Exercise 3x per week (30 min per time)          Starting 11/15/2016 patient would like to start chair exercises 3 times a week at 30 minutes a time.      Fall Risk Fall Risk  03/10/2017 02/14/2017 11/10/2016 12/15/2015 11/14/2014  Falls in the past year? Yes Yes Yes Yes Yes  Number falls in past yr: 2 or more 2 or more 2 or more 2 or more 2 or more  Injury with Fall? No - Yes No -  Risk Factor Category  High Fall Risk -  High Fall Risk High Fall Risk High Fall Risk  Risk for fall due to : History of fall(s);Impaired balance/gait;Impaired mobility - History of fall(s);Impaired balance/gait;Impaired mobility Impaired balance/gait;Mental status change Impaired balance/gait  Follow up Education provided;Falls prevention discussed - Falls prevention discussed Falls evaluation completed;Falls prevention discussed -   Depression Screen PHQ 2/9 Scores 03/10/2017 11/10/2016 12/15/2015 11/14/2014  PHQ - 2 Score 0 0 0 4  PHQ- 9 Score - - - 12     Cognitive Function MMSE - Mini Mental State Exam 03/10/2017 04/21/2016 10/21/2015 09/20/2014  Orientation to time 3 5 3 4   Orientation to Place 5 4 3 3   Registration 3 3 3 3   Attention/ Calculation 4 4 2 5   Recall 1 2 0 2  Language- name 2 objects 2 2 2 2   Language- repeat 1 1 1 1   Language- follow 3 step command 3 3 3 3   Language- read & follow direction 1 1 1 1   Write a sentence 1 1 1 1   Copy design 1 1 1 1   Total score 25 27 20 26      6CIT Screen 11/10/2016  What Year? 0 points  What month? 0 points  What time? 0 points  Count back from 20 0 points  Months in reverse 0 points  Repeat phrase 0 points  Total Score 0    Immunization History  Administered Date(s) Administered  . Influenza Split 08/25/2011, 07/25/2012  . Influenza Whole 09/12/2006, 08/25/2007, 08/01/2008, 08/20/2009, 08/20/2010  . Influenza,inj,Quad PF,36+ Mos 08/21/2013, 09/04/2014, 08/13/2015, 07/08/2016  . Pneumococcal Conjugate-13 06/20/2014  .  Pneumococcal Polysaccharide-23 09/12/2006, 08/20/2010, 04/16/2011  . Td 08/20/2010  . Zoster 12/26/2013   Screening Tests Health Maintenance  Topic Date Due  . INFLUENZA VACCINE  06/15/2017  . COLONOSCOPY  03/09/2018  . TETANUS/TDAP  08/20/2020  . DEXA SCAN  Completed  . PNA vac Low Risk Adult  Completed      Plan:   I have personally reviewed and noted the following in the patient's chart:   . Medical and social history . Use of alcohol, tobacco or illicit drugs  . Current medications and supplements . Functional ability and status . Nutritional status . Physical activity . Advanced directives . List of other physicians . Hospitalizations, surgeries, and ER visits in previous 12 months . Vitals . Screenings to include cognitive, depression, and falls . Referrals and appointments  In addition, I have reviewed and discussed with patient certain preventive protocols, quality metrics, and best practice recommendations. A written personalized care plan for preventive services as well as general preventive health recommendations were provided to patient.     Stormy Fabian, LPN  [id number]/88/9169

## 2017-03-12 DIAGNOSIS — J449 Chronic obstructive pulmonary disease, unspecified: Secondary | ICD-10-CM | POA: Diagnosis not present

## 2017-03-14 DIAGNOSIS — G4733 Obstructive sleep apnea (adult) (pediatric): Secondary | ICD-10-CM | POA: Diagnosis not present

## 2017-03-14 DIAGNOSIS — M6282 Rhabdomyolysis: Secondary | ICD-10-CM | POA: Diagnosis not present

## 2017-03-14 DIAGNOSIS — B9620 Unspecified Escherichia coli [E. coli] as the cause of diseases classified elsewhere: Secondary | ICD-10-CM | POA: Diagnosis not present

## 2017-03-14 DIAGNOSIS — M5136 Other intervertebral disc degeneration, lumbar region: Secondary | ICD-10-CM | POA: Diagnosis not present

## 2017-03-14 DIAGNOSIS — I10 Essential (primary) hypertension: Secondary | ICD-10-CM | POA: Diagnosis not present

## 2017-03-14 DIAGNOSIS — J9621 Acute and chronic respiratory failure with hypoxia: Secondary | ICD-10-CM | POA: Diagnosis not present

## 2017-03-14 DIAGNOSIS — N390 Urinary tract infection, site not specified: Secondary | ICD-10-CM | POA: Diagnosis not present

## 2017-03-14 DIAGNOSIS — J441 Chronic obstructive pulmonary disease with (acute) exacerbation: Secondary | ICD-10-CM | POA: Diagnosis not present

## 2017-03-14 DIAGNOSIS — F0390 Unspecified dementia without behavioral disturbance: Secondary | ICD-10-CM | POA: Diagnosis not present

## 2017-03-19 DIAGNOSIS — J449 Chronic obstructive pulmonary disease, unspecified: Secondary | ICD-10-CM | POA: Diagnosis not present

## 2017-03-31 DIAGNOSIS — B9620 Unspecified Escherichia coli [E. coli] as the cause of diseases classified elsewhere: Secondary | ICD-10-CM | POA: Diagnosis not present

## 2017-03-31 DIAGNOSIS — J441 Chronic obstructive pulmonary disease with (acute) exacerbation: Secondary | ICD-10-CM | POA: Diagnosis not present

## 2017-03-31 DIAGNOSIS — N390 Urinary tract infection, site not specified: Secondary | ICD-10-CM | POA: Diagnosis not present

## 2017-03-31 DIAGNOSIS — M6282 Rhabdomyolysis: Secondary | ICD-10-CM | POA: Diagnosis not present

## 2017-03-31 DIAGNOSIS — G4733 Obstructive sleep apnea (adult) (pediatric): Secondary | ICD-10-CM | POA: Diagnosis not present

## 2017-03-31 DIAGNOSIS — F0390 Unspecified dementia without behavioral disturbance: Secondary | ICD-10-CM | POA: Diagnosis not present

## 2017-03-31 DIAGNOSIS — M5136 Other intervertebral disc degeneration, lumbar region: Secondary | ICD-10-CM | POA: Diagnosis not present

## 2017-03-31 DIAGNOSIS — I10 Essential (primary) hypertension: Secondary | ICD-10-CM | POA: Diagnosis not present

## 2017-03-31 DIAGNOSIS — J9621 Acute and chronic respiratory failure with hypoxia: Secondary | ICD-10-CM | POA: Diagnosis not present

## 2017-04-11 DIAGNOSIS — J449 Chronic obstructive pulmonary disease, unspecified: Secondary | ICD-10-CM | POA: Diagnosis not present

## 2017-04-19 DIAGNOSIS — J449 Chronic obstructive pulmonary disease, unspecified: Secondary | ICD-10-CM | POA: Diagnosis not present

## 2017-04-28 ENCOUNTER — Other Ambulatory Visit: Payer: Self-pay | Admitting: Family Medicine

## 2017-05-03 ENCOUNTER — Telehealth: Payer: Self-pay | Admitting: *Deleted

## 2017-05-03 NOTE — Telephone Encounter (Signed)
[facility] called left a message requesting a refill on patient's medication, per  [facility] has sent over a request and we have not refilled it. [facility] does not know the name of the prescription. [phone]

## 2017-05-04 ENCOUNTER — Other Ambulatory Visit: Payer: Self-pay

## 2017-05-04 MED ORDER — ATORVASTATIN CALCIUM 10 MG PO TABS: 10.0000 mg | ORAL_TABLET | Freq: Every day | ORAL | 1 refills | Status: DC

## 2017-05-04 NOTE — Telephone Encounter (Signed)
I do not know if she doesn't. Will await a refill request from [facility]

## 2017-05-06 ENCOUNTER — Telehealth: Payer: Self-pay

## 2017-05-09 NOTE — Telephone Encounter (Signed)
Script sent  

## 2017-05-12 DIAGNOSIS — J449 Chronic obstructive pulmonary disease, unspecified: Secondary | ICD-10-CM | POA: Diagnosis not present

## 2017-05-19 DIAGNOSIS — J449 Chronic obstructive pulmonary disease, unspecified: Secondary | ICD-10-CM | POA: Diagnosis not present

## 2017-05-23 ENCOUNTER — Other Ambulatory Visit: Payer: Self-pay | Admitting: Family Medicine

## 2017-05-23 DIAGNOSIS — Z1231 Encounter for screening mammogram for malignant neoplasm of breast: Secondary | ICD-10-CM

## 2017-06-11 DIAGNOSIS — J449 Chronic obstructive pulmonary disease, unspecified: Secondary | ICD-10-CM | POA: Diagnosis not present

## 2017-06-17 ENCOUNTER — Other Ambulatory Visit: Payer: Self-pay | Admitting: Family Medicine

## 2017-06-17 DIAGNOSIS — J449 Chronic obstructive pulmonary disease, unspecified: Secondary | ICD-10-CM

## 2017-06-17 MED ORDER — ARFORMOTEROL TARTRATE 15 MCG/2ML IN NEBU: 15.0000 ug | INHALATION_SOLUTION | Freq: Two times a day (BID) | RESPIRATORY_TRACT | 1 refills | Status: DC

## 2017-06-19 DIAGNOSIS — J449 Chronic obstructive pulmonary disease, unspecified: Secondary | ICD-10-CM | POA: Diagnosis not present

## 2017-06-22 ENCOUNTER — Ambulatory Visit (HOSPITAL_COMMUNITY)
Admission: RE | Admit: 2017-06-22 | Discharge: 2017-06-22 | Disposition: A | Discharge status: Home / Self Care | Payer: Medicare PPO | Source: Ambulatory Visit | Attending: Family Medicine | Admitting: Family Medicine

## 2017-06-22 DIAGNOSIS — Z1231 Encounter for screening mammogram for malignant neoplasm of breast: Secondary | ICD-10-CM | POA: Diagnosis not present

## 2017-06-22 DIAGNOSIS — J449 Chronic obstructive pulmonary disease, unspecified: Secondary | ICD-10-CM | POA: Diagnosis not present

## 2017-06-30 ENCOUNTER — Ambulatory Visit (INDEPENDENT_AMBULATORY_CARE_PROVIDER_SITE_OTHER): Payer: Medicare PPO | Admitting: Family Medicine

## 2017-06-30 ENCOUNTER — Encounter: Payer: Self-pay | Admitting: Family Medicine

## 2017-06-30 VITALS — BP 160/86 | HR 99 | Resp 16 | Ht 73.0 in | Wt 214.0 lb

## 2017-06-30 DIAGNOSIS — R0902 Hypoxemia: Secondary | ICD-10-CM

## 2017-06-30 DIAGNOSIS — E785 Hyperlipidemia, unspecified: Secondary | ICD-10-CM | POA: Diagnosis not present

## 2017-06-30 DIAGNOSIS — I10 Essential (primary) hypertension: Secondary | ICD-10-CM | POA: Diagnosis not present

## 2017-06-30 DIAGNOSIS — R296 Repeated falls: Secondary | ICD-10-CM

## 2017-06-30 DIAGNOSIS — J449 Chronic obstructive pulmonary disease, unspecified: Secondary | ICD-10-CM | POA: Diagnosis not present

## 2017-06-30 DIAGNOSIS — R269 Unspecified abnormalities of gait and mobility: Secondary | ICD-10-CM | POA: Diagnosis not present

## 2017-06-30 DIAGNOSIS — F0150 Vascular dementia without behavioral disturbance: Secondary | ICD-10-CM

## 2017-06-30 NOTE — Patient Instructions (Addendum)
Physical exam in end Novemebr  Fasting lipid, cmp 1 week before next vsiit  Please start enjoying the vegetables and fruits  I will ask physical therapist to come to your home twice weekly for 4 weeks to help with balance and reduce fall risk due to 2 falls in apst month  You have gained 9 pounds and blood pressure is elevated, need to work on this!  Thank you  for choosing [facility]. We consider it a privelige to serve you.  Delivering excellent health care in a caring and  compassionate way is our goal.  Partnering with you,  so that together we can achieve this goal is our strategy.

## 2017-07-02 DIAGNOSIS — J449 Chronic obstructive pulmonary disease, unspecified: Secondary | ICD-10-CM | POA: Insufficient documentation

## 2017-07-02 DIAGNOSIS — R0902 Hypoxemia: Secondary | ICD-10-CM

## 2017-07-02 NOTE — Assessment & Plan Note (Signed)
Hyperlipidemia:Low fat diet discussed and encouraged.   Lipid Panel  Lab Results  Component Value Date   CHOL 159 02/14/2017   HDL 69 02/14/2017   LDLCALC 56 02/14/2017   TRIG 168 (H) 02/14/2017   CHOLHDL 2.3 02/14/2017   Updated lab needed at/ before next visit.

## 2017-07-02 NOTE — Assessment & Plan Note (Signed)
Stable on 2 liters of oxygen and current medication, continue same

## 2017-07-02 NOTE — Progress Notes (Signed)
   [name]     MRN: [id number]      DOB: 1941/07/15   HPI [name] is here for follow up and re-evaluation of chronic medical conditions, medication management and review of any available recent lab and radiology data.  Preventive health is updated, specifically  Cancer screening and Immunization.   Questions or concerns regarding consultations or procedures which the PT has had in the interim are  addressed. The PT denies any adverse reactions to current medications since the last visit.  C/o recurrent falls and requests in home physical therapy to help with gait and stability. States trhat at times her knees also just give out on her. Would benefit from cAP but without medicaid she cannot get this. Family is signing up for meals on wheels and she now has a fall necklace which she wears ROS Denies recent fever or chills. Denies sinus pressure, nasal congestion, ear pain or sore throat. Denies chest congestion, productive cough or wheezing. Denies chest pains, palpitations and leg swelling Denies abdominal pain, nausea, vomiting,diarrhea or constipation.   Denies dysuria, frequency, hesitancy or incontinence. Denies headaches, seizures, numbness, or tingling. Denies depression, anxiety or insomnia. Denies skin break down or rash.   PE  BP (!) 160/86   Pulse 99   Resp 16   Ht 6\' 1"  (1.854 m)   Wt 214 lb (97.1 kg)   SpO2 (!) 2%   BMI 28.23 kg/m   Patient alert and oriented and in no cardiopulmonary distress.  HEENT: No facial asymmetry, EOMI,   oropharynx pink and moist.  Neck decreased ROM no JVD, no mass.  Chest: Clear to auscultation bilaterally.Decreased air entry  CVS: S1, S2 no murmurs, no S3.Regular rate.  ABD: Soft non tender.   Ext: No edema  MS: decreased  ROM spine, shoulders, hips and knees.  Skin: Intact, no ulcerations or rash noted.  Psych: Good eye contact, normal affect. Memory impaired not anxious or depressed appearing.  CNS: CN  2-12 intact, power,  normal throughout.no focal deficits noted.   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 Recent 9 pound weight gain DASH diet and commitment to daily physical activity for a minimum of 30 minutes discussed and encouraged, as a part of hypertension management. The importance of attaining a healthy weight is also discussed.  BP/Weight 06/30/2017 03/10/2017 02/14/2017 02/01/2017 11/10/2016 09/23/2016 35/02/6567  Systolic BP 127 517 001 749 449 675 -  Diastolic BP 86 74 74 77 68 60 -  Wt. (Lbs) 214 205 207 224.1 213.04 - 222.22  BMI 28.23 27.05 27.31 29.57 28.11 - 29.32     No med change , review at next visit  Dementia Continue medication , appears stable  Hyperlipidemia LDL goal <100 Hyperlipidemia:Low fat diet discussed and encouraged.   Lipid Panel  Lab Results  Component Value Date   CHOL 159 02/14/2017   HDL 69 02/14/2017   LDLCALC 56 02/14/2017   TRIG 168 (H) 02/14/2017   CHOLHDL 2.3 02/14/2017   Updated lab needed at/ before next visit.     COPD with hypoxia ([place]) Stable on 2 liters of oxygen and current medication, continue same

## 2017-07-02 NOTE — Assessment & Plan Note (Signed)
Uncontrolled currently and not at goal. Recent 9 pound weight gain DASH diet and commitment to daily physical activity for a minimum of 30 minutes discussed and encouraged, as a part of hypertension management. The importance of attaining a healthy weight is also discussed.  BP/Weight 06/30/2017 03/10/2017 02/14/2017 02/01/2017 11/10/2016 09/23/2016 38/01/7792  Systolic BP 968 864 847 207 218 288 -  Diastolic BP 86 74 74 77 68 60 -  Wt. (Lbs) 214 205 207 224.1 213.04 - 222.22  BMI 28.23 27.05 27.31 29.57 28.11 - 29.32     No med change , review at next visit

## 2017-07-02 NOTE — Assessment & Plan Note (Signed)
Continue medication , appears stable

## 2017-07-02 NOTE — Assessment & Plan Note (Signed)
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

## 2017-07-09 DIAGNOSIS — I10 Essential (primary) hypertension: Secondary | ICD-10-CM | POA: Diagnosis not present

## 2017-07-09 DIAGNOSIS — J449 Chronic obstructive pulmonary disease, unspecified: Secondary | ICD-10-CM | POA: Diagnosis not present

## 2017-07-09 DIAGNOSIS — Z9981 Dependence on supplemental oxygen: Secondary | ICD-10-CM | POA: Diagnosis not present

## 2017-07-09 DIAGNOSIS — Z9181 History of falling: Secondary | ICD-10-CM | POA: Diagnosis not present

## 2017-07-09 DIAGNOSIS — E785 Hyperlipidemia, unspecified: Secondary | ICD-10-CM | POA: Diagnosis not present

## 2017-07-09 DIAGNOSIS — R2689 Other abnormalities of gait and mobility: Secondary | ICD-10-CM | POA: Diagnosis not present

## 2017-07-09 DIAGNOSIS — Z7982 Long term (current) use of aspirin: Secondary | ICD-10-CM | POA: Diagnosis not present

## 2017-07-09 DIAGNOSIS — F0390 Unspecified dementia without behavioral disturbance: Secondary | ICD-10-CM | POA: Diagnosis not present

## 2017-07-09 DIAGNOSIS — M170 Bilateral primary osteoarthritis of knee: Secondary | ICD-10-CM | POA: Diagnosis not present

## 2017-07-12 ENCOUNTER — Telehealth: Payer: Self-pay | Admitting: *Deleted

## 2017-07-12 DIAGNOSIS — J449 Chronic obstructive pulmonary disease, unspecified: Secondary | ICD-10-CM | POA: Diagnosis not present

## 2017-07-12 DIAGNOSIS — Z9981 Dependence on supplemental oxygen: Secondary | ICD-10-CM | POA: Diagnosis not present

## 2017-07-12 DIAGNOSIS — E785 Hyperlipidemia, unspecified: Secondary | ICD-10-CM | POA: Diagnosis not present

## 2017-07-12 DIAGNOSIS — R2689 Other abnormalities of gait and mobility: Secondary | ICD-10-CM | POA: Diagnosis not present

## 2017-07-12 DIAGNOSIS — F0390 Unspecified dementia without behavioral disturbance: Secondary | ICD-10-CM | POA: Diagnosis not present

## 2017-07-12 DIAGNOSIS — M170 Bilateral primary osteoarthritis of knee: Secondary | ICD-10-CM | POA: Diagnosis not present

## 2017-07-12 DIAGNOSIS — Z9181 History of falling: Secondary | ICD-10-CM | POA: Diagnosis not present

## 2017-07-12 DIAGNOSIS — I10 Essential (primary) hypertension: Secondary | ICD-10-CM | POA: Diagnosis not present

## 2017-07-12 DIAGNOSIS — Z7982 Long term (current) use of aspirin: Secondary | ICD-10-CM | POA: Diagnosis not present

## 2017-07-12 NOTE — Telephone Encounter (Signed)
Called and gave verbal order.

## 2017-07-12 NOTE — Telephone Encounter (Signed)
Lottie called regarding patient's physical therapy orders they would like to start them tomorrow or Thursday, Lottie stated to call her back at [phone] and if she is not there ask for any of the clinical managers. Please advise [phone]

## 2017-07-13 DIAGNOSIS — J449 Chronic obstructive pulmonary disease, unspecified: Secondary | ICD-10-CM | POA: Diagnosis not present

## 2017-07-13 DIAGNOSIS — M170 Bilateral primary osteoarthritis of knee: Secondary | ICD-10-CM | POA: Diagnosis not present

## 2017-07-13 DIAGNOSIS — F0390 Unspecified dementia without behavioral disturbance: Secondary | ICD-10-CM | POA: Diagnosis not present

## 2017-07-13 DIAGNOSIS — Z9981 Dependence on supplemental oxygen: Secondary | ICD-10-CM | POA: Diagnosis not present

## 2017-07-13 DIAGNOSIS — E785 Hyperlipidemia, unspecified: Secondary | ICD-10-CM | POA: Diagnosis not present

## 2017-07-13 DIAGNOSIS — Z9181 History of falling: Secondary | ICD-10-CM | POA: Diagnosis not present

## 2017-07-13 DIAGNOSIS — I10 Essential (primary) hypertension: Secondary | ICD-10-CM | POA: Diagnosis not present

## 2017-07-13 DIAGNOSIS — Z7982 Long term (current) use of aspirin: Secondary | ICD-10-CM | POA: Diagnosis not present

## 2017-07-13 DIAGNOSIS — R2689 Other abnormalities of gait and mobility: Secondary | ICD-10-CM | POA: Diagnosis not present

## 2017-07-20 DIAGNOSIS — J449 Chronic obstructive pulmonary disease, unspecified: Secondary | ICD-10-CM | POA: Diagnosis not present

## 2017-07-25 DIAGNOSIS — Z9181 History of falling: Secondary | ICD-10-CM | POA: Diagnosis not present

## 2017-07-25 DIAGNOSIS — M170 Bilateral primary osteoarthritis of knee: Secondary | ICD-10-CM | POA: Diagnosis not present

## 2017-07-25 DIAGNOSIS — R2689 Other abnormalities of gait and mobility: Secondary | ICD-10-CM | POA: Diagnosis not present

## 2017-07-25 DIAGNOSIS — I10 Essential (primary) hypertension: Secondary | ICD-10-CM | POA: Diagnosis not present

## 2017-07-25 DIAGNOSIS — Z9981 Dependence on supplemental oxygen: Secondary | ICD-10-CM | POA: Diagnosis not present

## 2017-07-25 DIAGNOSIS — Z7982 Long term (current) use of aspirin: Secondary | ICD-10-CM | POA: Diagnosis not present

## 2017-07-25 DIAGNOSIS — J449 Chronic obstructive pulmonary disease, unspecified: Secondary | ICD-10-CM | POA: Diagnosis not present

## 2017-07-25 DIAGNOSIS — F0390 Unspecified dementia without behavioral disturbance: Secondary | ICD-10-CM | POA: Diagnosis not present

## 2017-07-25 DIAGNOSIS — E785 Hyperlipidemia, unspecified: Secondary | ICD-10-CM | POA: Diagnosis not present

## 2017-07-27 ENCOUNTER — Other Ambulatory Visit: Payer: Self-pay | Admitting: Family Medicine

## 2017-07-27 DIAGNOSIS — I10 Essential (primary) hypertension: Secondary | ICD-10-CM | POA: Diagnosis not present

## 2017-07-27 DIAGNOSIS — E785 Hyperlipidemia, unspecified: Secondary | ICD-10-CM | POA: Diagnosis not present

## 2017-07-27 DIAGNOSIS — R2689 Other abnormalities of gait and mobility: Secondary | ICD-10-CM | POA: Diagnosis not present

## 2017-07-27 DIAGNOSIS — R413 Other amnesia: Secondary | ICD-10-CM

## 2017-07-27 DIAGNOSIS — F0390 Unspecified dementia without behavioral disturbance: Secondary | ICD-10-CM | POA: Diagnosis not present

## 2017-07-27 DIAGNOSIS — Z9981 Dependence on supplemental oxygen: Secondary | ICD-10-CM | POA: Diagnosis not present

## 2017-07-27 DIAGNOSIS — M170 Bilateral primary osteoarthritis of knee: Secondary | ICD-10-CM | POA: Diagnosis not present

## 2017-07-27 DIAGNOSIS — J449 Chronic obstructive pulmonary disease, unspecified: Secondary | ICD-10-CM | POA: Diagnosis not present

## 2017-07-27 DIAGNOSIS — Z9181 History of falling: Secondary | ICD-10-CM | POA: Diagnosis not present

## 2017-07-27 DIAGNOSIS — Z7982 Long term (current) use of aspirin: Secondary | ICD-10-CM | POA: Diagnosis not present

## 2017-08-01 DIAGNOSIS — M170 Bilateral primary osteoarthritis of knee: Secondary | ICD-10-CM | POA: Diagnosis not present

## 2017-08-01 DIAGNOSIS — F0390 Unspecified dementia without behavioral disturbance: Secondary | ICD-10-CM | POA: Diagnosis not present

## 2017-08-01 DIAGNOSIS — Z9181 History of falling: Secondary | ICD-10-CM | POA: Diagnosis not present

## 2017-08-01 DIAGNOSIS — J449 Chronic obstructive pulmonary disease, unspecified: Secondary | ICD-10-CM | POA: Diagnosis not present

## 2017-08-01 DIAGNOSIS — Z9981 Dependence on supplemental oxygen: Secondary | ICD-10-CM | POA: Diagnosis not present

## 2017-08-01 DIAGNOSIS — I10 Essential (primary) hypertension: Secondary | ICD-10-CM | POA: Diagnosis not present

## 2017-08-01 DIAGNOSIS — E785 Hyperlipidemia, unspecified: Secondary | ICD-10-CM | POA: Diagnosis not present

## 2017-08-01 DIAGNOSIS — Z7982 Long term (current) use of aspirin: Secondary | ICD-10-CM | POA: Diagnosis not present

## 2017-08-01 DIAGNOSIS — R2689 Other abnormalities of gait and mobility: Secondary | ICD-10-CM | POA: Diagnosis not present

## 2017-08-02 DIAGNOSIS — R2689 Other abnormalities of gait and mobility: Secondary | ICD-10-CM | POA: Diagnosis not present

## 2017-08-02 DIAGNOSIS — F0390 Unspecified dementia without behavioral disturbance: Secondary | ICD-10-CM | POA: Diagnosis not present

## 2017-08-02 DIAGNOSIS — Z9181 History of falling: Secondary | ICD-10-CM | POA: Diagnosis not present

## 2017-08-02 DIAGNOSIS — Z7982 Long term (current) use of aspirin: Secondary | ICD-10-CM | POA: Diagnosis not present

## 2017-08-02 DIAGNOSIS — E785 Hyperlipidemia, unspecified: Secondary | ICD-10-CM | POA: Diagnosis not present

## 2017-08-02 DIAGNOSIS — Z9981 Dependence on supplemental oxygen: Secondary | ICD-10-CM | POA: Diagnosis not present

## 2017-08-02 DIAGNOSIS — J449 Chronic obstructive pulmonary disease, unspecified: Secondary | ICD-10-CM | POA: Diagnosis not present

## 2017-08-02 DIAGNOSIS — M170 Bilateral primary osteoarthritis of knee: Secondary | ICD-10-CM | POA: Diagnosis not present

## 2017-08-02 DIAGNOSIS — I10 Essential (primary) hypertension: Secondary | ICD-10-CM | POA: Diagnosis not present

## 2017-08-08 DIAGNOSIS — F0390 Unspecified dementia without behavioral disturbance: Secondary | ICD-10-CM | POA: Diagnosis not present

## 2017-08-08 DIAGNOSIS — I10 Essential (primary) hypertension: Secondary | ICD-10-CM | POA: Diagnosis not present

## 2017-08-08 DIAGNOSIS — M170 Bilateral primary osteoarthritis of knee: Secondary | ICD-10-CM | POA: Diagnosis not present

## 2017-08-08 DIAGNOSIS — Z9981 Dependence on supplemental oxygen: Secondary | ICD-10-CM | POA: Diagnosis not present

## 2017-08-08 DIAGNOSIS — E785 Hyperlipidemia, unspecified: Secondary | ICD-10-CM | POA: Diagnosis not present

## 2017-08-08 DIAGNOSIS — R2689 Other abnormalities of gait and mobility: Secondary | ICD-10-CM | POA: Diagnosis not present

## 2017-08-08 DIAGNOSIS — J449 Chronic obstructive pulmonary disease, unspecified: Secondary | ICD-10-CM | POA: Diagnosis not present

## 2017-08-08 DIAGNOSIS — Z7982 Long term (current) use of aspirin: Secondary | ICD-10-CM | POA: Diagnosis not present

## 2017-08-08 DIAGNOSIS — Z9181 History of falling: Secondary | ICD-10-CM | POA: Diagnosis not present

## 2017-08-09 DIAGNOSIS — Z9181 History of falling: Secondary | ICD-10-CM | POA: Diagnosis not present

## 2017-08-09 DIAGNOSIS — J449 Chronic obstructive pulmonary disease, unspecified: Secondary | ICD-10-CM | POA: Diagnosis not present

## 2017-08-09 DIAGNOSIS — F0390 Unspecified dementia without behavioral disturbance: Secondary | ICD-10-CM | POA: Diagnosis not present

## 2017-08-09 DIAGNOSIS — M170 Bilateral primary osteoarthritis of knee: Secondary | ICD-10-CM | POA: Diagnosis not present

## 2017-08-09 DIAGNOSIS — R2689 Other abnormalities of gait and mobility: Secondary | ICD-10-CM | POA: Diagnosis not present

## 2017-08-09 DIAGNOSIS — E785 Hyperlipidemia, unspecified: Secondary | ICD-10-CM | POA: Diagnosis not present

## 2017-08-09 DIAGNOSIS — I10 Essential (primary) hypertension: Secondary | ICD-10-CM | POA: Diagnosis not present

## 2017-08-09 DIAGNOSIS — Z7982 Long term (current) use of aspirin: Secondary | ICD-10-CM | POA: Diagnosis not present

## 2017-08-09 DIAGNOSIS — Z9981 Dependence on supplemental oxygen: Secondary | ICD-10-CM | POA: Diagnosis not present

## 2017-08-12 DIAGNOSIS — J449 Chronic obstructive pulmonary disease, unspecified: Secondary | ICD-10-CM | POA: Diagnosis not present

## 2017-08-19 DIAGNOSIS — J449 Chronic obstructive pulmonary disease, unspecified: Secondary | ICD-10-CM | POA: Diagnosis not present

## 2017-09-11 DIAGNOSIS — J449 Chronic obstructive pulmonary disease, unspecified: Secondary | ICD-10-CM | POA: Diagnosis not present

## 2017-09-19 DIAGNOSIS — J449 Chronic obstructive pulmonary disease, unspecified: Secondary | ICD-10-CM | POA: Diagnosis not present

## 2017-09-23 ENCOUNTER — Ambulatory Visit (INDEPENDENT_AMBULATORY_CARE_PROVIDER_SITE_OTHER): Payer: Medicare PPO

## 2017-09-23 DIAGNOSIS — Z23 Encounter for immunization: Secondary | ICD-10-CM

## 2017-09-26 ENCOUNTER — Ambulatory Visit: Payer: Medicare PPO | Admitting: Family Medicine

## 2017-09-27 ENCOUNTER — Ambulatory Visit (INDEPENDENT_AMBULATORY_CARE_PROVIDER_SITE_OTHER): Payer: Medicare PPO | Admitting: Family Medicine

## 2017-09-27 ENCOUNTER — Ambulatory Visit: Payer: Medicare PPO | Admitting: Family Medicine

## 2017-09-27 ENCOUNTER — Encounter: Payer: Self-pay | Admitting: Family Medicine

## 2017-09-27 VITALS — BP 110/70 | HR 84 | Resp 14 | Ht 73.0 in

## 2017-09-27 DIAGNOSIS — R296 Repeated falls: Secondary | ICD-10-CM | POA: Diagnosis not present

## 2017-09-27 DIAGNOSIS — L309 Dermatitis, unspecified: Secondary | ICD-10-CM

## 2017-09-27 DIAGNOSIS — I10 Essential (primary) hypertension: Secondary | ICD-10-CM | POA: Diagnosis not present

## 2017-09-27 MED ORDER — PREDNISONE 5 MG PO TABS: 5.0000 mg | ORAL_TABLET | Freq: Two times a day (BID) | ORAL | 0 refills | Status: AC

## 2017-09-27 MED ORDER — BETAMETHASONE DIPROPIONATE 0.05 % EX CREA: TOPICAL_CREAM | CUTANEOUS | 0 refills | Status: DC

## 2017-09-27 NOTE — Patient Instructions (Signed)
Keep appointment for physical as before  Labs are already ordered for next visit, please try  To get them 5 days before appointment  You are treated for rash with 5 days of prednisone by mouth and cream   Pad the chair seat  So elbow is not rough, and try to not rest on it as much  Thanks for choosing [facility], we consider it a privelige to serve you.

## 2017-10-08 ENCOUNTER — Encounter: Payer: Self-pay | Admitting: Family Medicine

## 2017-10-08 DIAGNOSIS — L309 Dermatitis, unspecified: Secondary | ICD-10-CM | POA: Insufficient documentation

## 2017-10-08 NOTE — Assessment & Plan Note (Signed)
Home safety reviewed briefly ?

## 2017-10-08 NOTE — Assessment & Plan Note (Signed)
Acute allergic dermatitis to left arm, unknown etiology, short course of oral presnisone and topical steroid for a few days Contact dermatitis to left elbow, pt to use pad over armrest of wheelchair to reduce the friction of contact dermatitis

## 2017-10-08 NOTE — Assessment & Plan Note (Signed)
Controlled, no change in medication  

## 2017-10-08 NOTE — Progress Notes (Signed)
   [name]     MRN: [id number]      DOB: 08-23-1941   HPI [name] is here with a 4 day h/o itchy rash to left arm, no new personal care products or detergents, believes she may be exposed to bug bites in her new environment where she has been staying this past weekend though no obvious open area. C/o rough dry darkened skin on left elbow where she rests it on a chair Denies fever , chills or purulent drainage from the sores  ROS . Denies sinus pressure, nasal congestion, ear pain or sore throat. Denies chest congestion, productive cough or wheezing. Denies chest pains, palpitations and leg swelling Denies abdominal pain, nausea, vomiting,diarrhea or constipation.   Denies dysuria, frequency, hesitancy or incontinence. Denies uncontrolled  joint pain, swelling and does have  limitation in mobility. Denies headaches, seizures, numbness, or tingling. Denies depression, anxiety or insomnia.  PE  BP 110/70   Pulse 84   Resp 14   Ht 6\' 1"  (1.854 m)   SpO2 91% Comment: 2 liters oxygen  BMI 28.23 kg/m   Patient alert and oriented and in no cardiopulmonary distress.  HEENT: No facial asymmetry, EOMI,   oropharynx pink and moist.  Neck supple no JVD, no mass.  Chest: Clear to auscultation bilaterally.  CVS: S1, S2 no murmurs, no S3.Regular rate.  ABD: Soft non tender.   Ext: No edema  MS: decreased  ROM spine, shoulders, hips and knees.  Skin: Intact, macular rash noted on left arm in groups Left elbow hyperpigmented and rough  Psych: Good eye contact, normal affect. Memory intact not anxious or depressed appearing.  CNS: CN 2-12 intact, power,  normal throughout.no focal deficits noted.   Assessment & Plan Dermatitis Acute allergic dermatitis to left arm, unknown etiology, short course of oral presnisone and topical steroid for a few days Contact dermatitis to left elbow, pt to use pad over armrest of wheelchair to reduce the friction of contact  dermatitis  Essential hypertension Controlled, no change in medication   Falls frequently Home safety reviewed briefly

## 2017-10-10 ENCOUNTER — Encounter: Payer: Self-pay | Admitting: Family Medicine

## 2017-10-10 ENCOUNTER — Ambulatory Visit (INDEPENDENT_AMBULATORY_CARE_PROVIDER_SITE_OTHER): Payer: Medicare PPO | Admitting: Family Medicine

## 2017-10-10 VITALS — BP 130/82 | HR 102 | Resp 16 | Ht 73.0 in | Wt 217.0 lb

## 2017-10-10 DIAGNOSIS — R195 Other fecal abnormalities: Secondary | ICD-10-CM | POA: Insufficient documentation

## 2017-10-10 DIAGNOSIS — I10 Essential (primary) hypertension: Secondary | ICD-10-CM

## 2017-10-10 DIAGNOSIS — Z1211 Encounter for screening for malignant neoplasm of colon: Secondary | ICD-10-CM | POA: Diagnosis not present

## 2017-10-10 DIAGNOSIS — E785 Hyperlipidemia, unspecified: Secondary | ICD-10-CM | POA: Diagnosis not present

## 2017-10-10 DIAGNOSIS — Z0000 Encounter for general adult medical examination without abnormal findings: Secondary | ICD-10-CM | POA: Diagnosis not present

## 2017-10-10 DIAGNOSIS — D369 Benign neoplasm, unspecified site: Secondary | ICD-10-CM

## 2017-10-10 HISTORY — DX: Other fecal abnormalities: R19.5

## 2017-10-10 LAB — POC HEMOCCULT BLD/STL (OFFICE/1-CARD/DIAGNOSTIC): FECAL OCCULT BLD: POSITIVE — AB

## 2017-10-10 NOTE — Progress Notes (Signed)
    [name]     MRN: [id number]      DOB: 07/12/1941  HPI: Patient is in for annual physical exam. No other health concerns are expressed or addressed at the visit. Immunization is reviewed    PE: BP 130/82   Pulse (!) 102   Resp 16   Ht 6\' 1"  (1.854 m)   Wt 217 lb (98.4 kg)   SpO2 (!) 88%   BMI 28.63 kg/m   Pleasant  female, alert and oriented x 3, in no cardio-pulmonary distress. Afebrile. HEENT No facial trauma or asymetry. Sinuses non tender.  Extra occullar muscles intact,  External ears normal, . Oropharynx moist, no exudate. Neck: decreased ROM, no adenopathy, no mass, no JVD. Chest: Clear to ascultation bilaterally.No crackles or wheezes.Markedly reduced air entry bilaterally. Non tender to palpation  Breast: Not examined, had mammogram 3 months ago  Cardiovascular system; Heart sounds normal,  S1 and  S2 ,no S3.  No murmur, or thrill. Apical beat not displaced Peripheral pulses normal.  Abdomen: Soft, non tender, no organomegaly or masses. No bruits. Bowel sounds normal. No guarding, tenderness or rebound.  Rectal:  Normal sphincter tone. No rectal mass. Guaiac positive stool.  GU: Not examined Musculoskeletal exam: Decreased  ROM of spine, hips , shoulders and knees. No deformity ,swelling or crepitus noted. No muscle wasting or atrophy.   Neurologic: Cranial nerves 2 to 12 intact. Power, tone ,sensation and reflexes normal throughout. Abnormality  in gait. No tremor.  Skin: Intact, no ulceration, erythema , scaling or rash noted. Pigmentation normal throughout  Psych; Normal mood and affect. Judgement and concentration normal   Assessment & Plan:  Annual physical exam Annual exam as documented. Counseling done  re healthy lifestyle involving commitment to regular exercise and a heart healthy diet,Regular seat belt use and home safety, is also discussed. . Immunization and cancer screening needs are specifically addressed  at this visit.   Multiple adenomatous polyps Has heme positive stool on exam at visit, needs GI eval  Heme positive stool Otherwise asymptomatic but has h/o adenomatous polyps, needs GI eval

## 2017-10-10 NOTE — Patient Instructions (Addendum)
Annual wellness with nurse in April , call if you need me before  MD follow up in 6 months  You are being referred for in home occupational therapy twice weekly for 6 weeks for right shoulder pain and stiffness  Labs today please lipid, cmp and EGFR, cBC  Be careful not to fall     Fall Prevention in the Home Falls can cause injuries. They can happen to people of all ages. There are many things you can do to make your home safe and to help prevent falls. What can I do on the outside of my home?  Regularly fix the edges of walkways and driveways and fix any cracks.  Remove anything that might make you trip as you walk through a door, such as a raised step or threshold.  Trim any bushes or trees on the path to your home.  Use bright outdoor lighting.  Clear any walking paths of anything that might make someone trip, such as rocks or tools.  Regularly check to see if handrails are loose or broken. Make sure that both sides of any steps have handrails.  Any raised decks and porches should have guardrails on the edges.  Have any leaves, snow, or ice cleared regularly.  Use sand or salt on walking paths during winter.  Clean up any spills in your garage right away. This includes oil or grease spills. What can I do in the bathroom?  Use night lights.  Install grab bars by the toilet and in the tub and shower. Do not use towel bars as grab bars.  Use non-skid mats or decals in the tub or shower.  If you need to sit down in the shower, use a plastic, non-slip stool.  Keep the floor dry. Clean up any water that spills on the floor as soon as it happens.  Remove soap buildup in the tub or shower regularly.  Attach bath mats securely with double-sided non-slip rug tape.  Do not have throw rugs and other things on the floor that can make you trip. What can I do in the bedroom?  Use night lights.  Make sure that you have a light by your bed that is easy to reach.  Do not  use any sheets or blankets that are too big for your bed. They should not hang down onto the floor.  Have a firm chair that has side arms. You can use this for support while you get dressed.  Do not have throw rugs and other things on the floor that can make you trip. What can I do in the kitchen?  Clean up any spills right away.  Avoid walking on wet floors.  Keep items that you use a lot in easy-to-reach places.  If you need to reach something above you, use a strong step stool that has a grab bar.  Keep electrical cords out of the way.  Do not use floor polish or wax that makes floors slippery. If you must use wax, use non-skid floor wax.  Do not have throw rugs and other things on the floor that can make you trip. What can I do with my stairs?  Do not leave any items on the stairs.  Make sure that there are handrails on both sides of the stairs and use them. Fix handrails that are broken or loose. Make sure that handrails are as long as the stairways.  Check any carpeting to make sure that it is firmly attached to the  stairs. Fix any carpet that is loose or worn.  Avoid having throw rugs at the top or bottom of the stairs. If you do have throw rugs, attach them to the floor with carpet tape.  Make sure that you have a light switch at the top of the stairs and the bottom of the stairs. If you do not have them, ask someone to add them for you. What else can I do to help prevent falls?  Wear shoes that: ? Do not have high heels. ? Have rubber bottoms. ? Are comfortable and fit you well. ? Are closed at the toe. Do not wear sandals.  If you use a stepladder: ? Make sure that it is fully opened. Do not climb a closed stepladder. ? Make sure that both sides of the stepladder are locked into place. ? Ask someone to hold it for you, if possible.  Clearly mark and make sure that you can see: ? Any grab bars or handrails. ? First and last steps. ? Where the edge of each step  is.  Use tools that help you move around (mobility aids) if they are needed. These include: ? Canes. ? Walkers. ? Scooters. ? Crutches.  Turn on the lights when you go into a dark area. Replace any light bulbs as soon as they burn out.  Set up your furniture so you have a clear path. Avoid moving your furniture around.  If any of your floors are uneven, fix them.  If there are any pets around you, be aware of where they are.  Review your medicines with your doctor. Some medicines can make you feel dizzy. This can increase your chance of falling. Ask your doctor what other things that you can do to help prevent falls. This information is not intended to replace advice given to you by your health care provider. Make sure you discuss any questions you have with your health care provider. Document Released: 08/28/2009 Document Revised: 04/08/2016 Document Reviewed: 12/06/2014 Elsevier Interactive Patient Education  [employer].

## 2017-10-10 NOTE — Assessment & Plan Note (Addendum)
Annual exam as documented. Counseling done  re healthy lifestyle involving commitment to regular exercise and a heart healthy diet,Regular seat belt use and home safety, is also discussed. . Immunization and cancer screening needs are specifically addressed at this visit.

## 2017-10-10 NOTE — Assessment & Plan Note (Signed)
Otherwise asymptomatic but has h/o adenomatous polyps, needs GI eval

## 2017-10-10 NOTE — Assessment & Plan Note (Signed)
Has heme positive stool on exam at visit, needs GI eval

## 2017-10-11 ENCOUNTER — Encounter: Payer: Self-pay | Admitting: Gastroenterology

## 2017-10-11 LAB — COMPLETE METABOLIC PANEL WITH GFR
AG RATIO: 1.7 (calc) (ref 1.0–2.5)
ALBUMIN MSPROF: 4 g/dL (ref 3.6–5.1)
ALKALINE PHOSPHATASE (APISO): 72 U/L (ref 33–130)
ALT: 13 U/L (ref 6–29)
AST: 17 U/L (ref 10–35)
BILIRUBIN TOTAL: 0.3 mg/dL (ref 0.2–1.2)
BUN / CREAT RATIO: 13 (calc) (ref 6–22)
BUN: 15 mg/dL (ref 7–25)
CO2: 32 mmol/L (ref 20–32)
Calcium: 9.3 mg/dL (ref 8.6–10.4)
Chloride: 106 mmol/L (ref 98–110)
Creat: 1.14 mg/dL — ABNORMAL HIGH (ref 0.60–0.93)
GFR, Est African American: 54 mL/min/{1.73_m2} — ABNORMAL LOW (ref >=60)
GFR, Est Non African American: 47 mL/min/{1.73_m2} — ABNORMAL LOW (ref >=60)
GLOBULIN: 2.4 g/dL (ref 1.9–3.7)
Glucose, Bld: 92 mg/dL (ref 65–99)
Potassium: 4.1 mmol/L (ref 3.5–5.3)
SODIUM: 145 mmol/L (ref 135–146)
Total Protein: 6.4 g/dL (ref 6.1–8.1)

## 2017-10-11 LAB — LIPID PANEL
CHOL/HDL RATIO: 2.1 (calc) (ref <5.0)
Cholesterol: 170 mg/dL (ref <200)
HDL: 81 mg/dL (ref >50)
LDL CHOLESTEROL (CALC): 65 mg/dL
NON-HDL CHOLESTEROL (CALC): 89 mg/dL (ref <130)
Triglycerides: 166 mg/dL — ABNORMAL HIGH (ref <150)

## 2017-10-11 LAB — CBC
HEMATOCRIT: 34 % — AB (ref 35.0–45.0)
HEMOGLOBIN: 10.8 g/dL — AB (ref 11.7–15.5)
MCH: 26.2 pg — ABNORMAL LOW (ref 27.0–33.0)
MCHC: 31.8 g/dL — AB (ref 32.0–36.0)
MCV: 82.5 fL (ref 80.0–100.0)
MPV: 10.3 fL (ref 7.5–12.5)
Platelets: 280 10*3/uL (ref 140–400)
RBC: 4.12 10*6/uL (ref 3.80–5.10)
RDW: 14.1 % (ref 11.0–15.0)
WBC: 5.7 10*3/uL (ref 3.8–10.8)

## 2017-10-12 DIAGNOSIS — J449 Chronic obstructive pulmonary disease, unspecified: Secondary | ICD-10-CM | POA: Diagnosis not present

## 2017-10-19 ENCOUNTER — Other Ambulatory Visit: Payer: Self-pay | Admitting: Family Medicine

## 2017-10-19 DIAGNOSIS — J449 Chronic obstructive pulmonary disease, unspecified: Secondary | ICD-10-CM | POA: Diagnosis not present

## 2017-10-20 NOTE — Telephone Encounter (Signed)
Seen [phone]

## 2017-10-27 ENCOUNTER — Ambulatory Visit: Payer: Medicare PPO | Admitting: Gastroenterology

## 2017-11-11 DIAGNOSIS — J449 Chronic obstructive pulmonary disease, unspecified: Secondary | ICD-10-CM | POA: Diagnosis not present

## 2017-11-19 DIAGNOSIS — J449 Chronic obstructive pulmonary disease, unspecified: Secondary | ICD-10-CM | POA: Diagnosis not present

## 2017-12-12 DIAGNOSIS — J449 Chronic obstructive pulmonary disease, unspecified: Secondary | ICD-10-CM | POA: Diagnosis not present

## 2017-12-20 DIAGNOSIS — J449 Chronic obstructive pulmonary disease, unspecified: Secondary | ICD-10-CM | POA: Diagnosis not present

## 2017-12-21 ENCOUNTER — Other Ambulatory Visit: Payer: Self-pay | Admitting: Family Medicine

## 2017-12-23 ENCOUNTER — Encounter: Payer: Self-pay | Admitting: Gastroenterology

## 2017-12-23 ENCOUNTER — Telehealth: Payer: Self-pay | Admitting: *Deleted

## 2017-12-23 ENCOUNTER — Ambulatory Visit (INDEPENDENT_AMBULATORY_CARE_PROVIDER_SITE_OTHER): Payer: Medicare PPO | Admitting: Gastroenterology

## 2017-12-23 VITALS — BP 109/59 | HR 87 | Temp 98.9°F | Ht 73.0 in

## 2017-12-23 DIAGNOSIS — R195 Other fecal abnormalities: Secondary | ICD-10-CM

## 2017-12-23 DIAGNOSIS — D369 Benign neoplasm, unspecified site: Secondary | ICD-10-CM

## 2017-12-23 NOTE — Progress Notes (Signed)
Primary Care Physician:  [name], MD  Primary Gastroenterologist:  [name] Cornea, MD   Chief Complaint  Patient presents with  . heme + stool    HPI:  [name] is a 77 y.o. female here at the request of Dr. Moshe Cipro for heme positive stool.  Patient does have a history of adenomatous colon polyps in the past.  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  Her last colonoscopy was in 2014.  She had a couple of adenomatous polyps removed at that time and advised to come back in 5 years.  She has a history of stable normocytic anemia with normal iron and ferritin last year.  Clinically she denies any GI issues.  No overt GI bleeding or melena.  Bowel movements are regular.  Appetite is good.  No heartburn, vomiting, dysphagia, abdominal pain.  She has notable family members that assist with her care from day-to-day.  She is less mobile than before, utilizes wheelchair regularly.  Current Outpatient Medications  Medication Sig Dispense Refill  . acetaminophen (TYLENOL) 500 MG tablet Take 1,000 mg by mouth 2 (two) times daily.     . Albuterol Sulfate (PROAIR RESPICLICK) 160 (90 BASE) MCG/ACT AEPB Inhale 2 puffs into the lungs every 8 (eight) hours as needed. 1 each 11  . alendronate (FOSAMAX) 70 MG tablet TAKE 1 TABLET ONE TIME A WEEK. TAKE WITH A FULL GLASS OF WATER ON AN EMPTY STOMACH. 12 tablet 1  . arformoterol (BROVANA) 15 MCG/2ML NEBU Take 2 mLs (15 mcg total) by nebulization 2 (two) times daily. 360 mL 1  . aspirin EC 81 MG tablet Take 81 mg by mouth daily.    . calcium-vitamin D (OSCAL WITH D) 500-200 MG-UNIT per tablet Take 1 tablet by mouth daily.     [name] donepezil (ARICEPT) 10 MG tablet TAKE 1 TABLET AT BEDTIME  90 tablet 1  . ENSURE (ENSURE) Take 1 Can by mouth daily as needed. Take one by mouth with meals     . FLUoxetine (PROZAC) 40 MG capsule TAKE 1 CAPSULE EVERY DAY 90 capsule 1   . loratadine (CLARITIN) 10 MG tablet Take 10 mg by mouth daily as needed for allergies.     . Multiple Vitamins-Minerals (CEROVITE SENIOR) TABS Take 1 tablet by mouth daily.     [name] omeprazole (PRILOSEC) 40 MG capsule TAKE 1 CAPSULE EVERY DAY 90 capsule 1  . verapamil (CALAN-SR) 240 MG CR tablet TAKE 1 TABLET AT BEDTIME 90 tablet 0   No current facility-administered medications for this visit.     Allergies as of 12/23/2017 - Review Complete 12/23/2017  Allergen Reaction Noted  . Fenofibrate Nausea And Vomiting 02/26/2013  . Pravastatin sodium Nausea And Vomiting   . Sulfonamide derivatives Other (See Comments) 02/17/2010  . Ciprofloxacin Rash     Past Medical History:  Diagnosis Date  . Abnormality of gait 06/06/2014  . Allergic rhinitis   . Anemia   . Ankle fracture, right   . Anxiety   . Arm fracture, left   . Arthritis    abnormal gait   . COPD (chronic obstructive pulmonary disease) (HCC)    chronic CO2 retention, decreased DLCO   . Cystic acne    adult   . 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 Degenerative disc disease, lumbar    L5 nerve impingement   . Depression   . DNR (do not  resuscitate)   . GERD (gastroesophageal reflux disease)   . History of colon surgery    right hemicolectomy for high-grade adenomas in right colon 2013  . Hyperglycemia   . Hypertension   . Low back pain   . Mediastinal lymphadenopathy 1/9   resolving   . Memory difficulty 09/20/2014   mild dementia  . Onychomycosis   . OSA on CPAP   . Sleep apnea    stop bang score 6    Past Surgical History:  Procedure Laterality Date  . ABDOMINAL HYSTERECTOMY  1976   secondary to bleeding   . APPENDECTOMY    . COLON RESECTION  08/18/2012   Procedure: HAND ASSISTED LAPAROSCOPIC COLON RESECTION;  Surgeon: Jamesetta So, MD;  Location: [facility];  Service: General;  Laterality: N/A;  . COLONOSCOPY  07/20/2012   Dr. Gala Romney: multiple colonic polyps with large polyps on right side, s/p  saline-assisted debulking piecemeal polypectomy and ablation. Not all removed. Path with tubulovillous adenomas, high grade dysplasia.   . COLONOSCOPY N/A 03/09/2013   QJJ:HERDEYC polyps-tubular adenomas. S/p right hemicolectomy. next tcs 02/2018  . epidural steroids    . ESOPHAGOGASTRODUODENOSCOPY (EGD) WITH ESOPHAGEAL DILATION N/A 02/06/2014   Procedure: ESOPHAGOGASTRODUODENOSCOPY (EGD) WITH ESOPHAGEAL DILATION;  Surgeon: Daneil Dolin, MD;  Location: [facility];  Service: Endoscopy;  Laterality: N/A;  9:30  . OOPHORECTOMY  1976  . ORIF ANKLE FRACTURE  01/18/2012   Procedure: OPEN REDUCTION INTERNAL FIXATION (ORIF) ANKLE FRACTURE;  Surgeon: Arther Abbott, MD;  Location: [facility];  Service: Orthopedics;  Laterality: Left;  . ORIF ANKLE FRACTURE Right 01/13/2016   Procedure: OPEN REDUCTION INTERNAL FIXATION (ORIF) ANKLE FRACTURE;  Surgeon: Carole Civil, MD;  Location: [facility];  Service: Orthopedics;  Laterality: Right;    Family History  Problem Relation Age of Onset  . Stomach cancer Father   . Cancer Father   . Cancer Mother        brain   . Breast cancer Mother   . Arthritis Unknown     Social History   Socioeconomic History  . Marital status: Widowed    Spouse name: Not on file  . Number of children: 4  . Years of education: college 1  . Highest education level: Not on file  Social Needs  . Financial resource strain: Not on file  . Food insecurity - worry: Not on file  . Food insecurity - inability: Not on file  . Transportation needs - medical: Not on file  . Transportation needs - non-medical: Not on file  Occupational History  . Occupation: employed in the tobacco industry     Employer: RETIRED  Tobacco Use  . Smoking status: Former Smoker    Packs/day: 1.50    Years: 40.00    Pack years: 60.00    Types: Cigarettes    Last attempt to quit: 06/21/2006    Years since quitting: 11.5  . Smokeless tobacco: Never Used  Substance and Sexual Activity  . Alcohol use:  No  . Drug use: No  . Sexual activity: No  Other Topics Concern  . Not on file  Social History Narrative   Lives at home alone   Right-handed   Drinks 2 or more cups of coffee each morning      ROS:  General: Negative for anorexia, weight loss, fever, chills, fatigue, weakness. Eyes: Negative for vision changes.  ENT: Negative for hoarseness, difficulty swallowing , nasal congestion. CV: Negative for chest pain, angina, palpitations, dyspnea  on exertion, peripheral edema.  Respiratory: Negative for dyspnea at rest, dyspnea on exertion, cough, sputum, wheezing.  GI: See history of present illness. GU:  Negative for dysuria, hematuria, urinary incontinence, urinary frequency, nocturnal urination.  MS: Negative for joint pain, low back pain.  Derm: Negative for rash or itching.  Neuro: Negative for weakness, abnormal sensation, seizure, frequent headaches,confusion.  Some mild memory loss Psych: Negative for anxiety, depression, suicidal ideation, hallucinations.  Endo: Negative for unusual weight change.  Heme: Negative for bruising or bleeding. Allergy: Negative for rash or hives.    Physical Examination:  BP (!) 109/59   Pulse 87   Temp 98.9 F (37.2 C) (Oral)   Ht 6\' 1"  (1.854 m)   BMI 28.63 kg/m    General: Well-nourished, well-developed in no acute distress.  Head: Normocephalic, atraumatic.   Eyes: Conjunctiva pink, no icterus. Mouth: Oropharyngeal mucosa moist and pink , no lesions erythema or exudate. Neck: Supple without thyromegaly, masses, or lymphadenopathy.  Lungs: Clear to auscultation bilaterally.  Heart: Regular rate and rhythm, no murmurs rubs or gallops.  Abdomen: Bowel sounds are normal, nontender, nondistended, no hepatosplenomegaly or masses, no abdominal bruits or    hernia , no rebound or guarding.   Rectal: Deferred Extremities: No lower extremity edema. No clubbing or deformities.  Neuro: Alert and oriented x 4 , grossly normal neurologically.   Skin: Warm and dry, no rash or jaundice.   Psych: Alert and cooperative, normal mood and affect.  Labs: Lab Results  Component Value Date   CREATININE 1.14 (H) 10/10/2017   BUN 15 10/10/2017   NA 145 10/10/2017   K 4.1 10/10/2017   CL 106 10/10/2017   CO2 32 10/10/2017   Lab Results  Component Value Date   ALT 13 10/10/2017   AST 17 10/10/2017   ALKPHOS 74 02/14/2017   BILITOT 0.3 10/10/2017   Lab Results  Component Value Date   WBC 5.7 10/10/2017   HGB 10.8 (L) 10/10/2017   HCT 34.0 (L) 10/10/2017   MCV 82.5 10/10/2017   PLT 280 10/10/2017   Lab Results  Component Value Date   IRON 63 02/14/2017   TIBC 321 02/14/2017   FERRITIN 49 02/14/2017   Lab Results  Component Value Date   VITAMINB12 528 02/14/2017   Lab Results  Component Value Date   FOLATE 15.8 02/14/2017     Imaging Studies: No results found.

## 2017-12-23 NOTE — Patient Instructions (Signed)
1. Colonoscopy as scheduled. See separate instructions.  

## 2017-12-23 NOTE — Assessment & Plan Note (Signed)
77 year old female with recent heme positive stool on digital rectal exam, prior history of multiple adenomatous colon polyps.  In 2013 she had tubulovillous adenoma with high-grade dysplasia, underwent right hemicolectomy.  Last colonoscopy in 2014.  Clinically patient has had decline in mobility as well as some mild dementia.  Discussed at length with patient and her daughter regarding futility of colonoscopy at this time in the setting of heme positive stool and significant prior adenomatous colon polyps.  Everyone is in agreement to pursue at least this 1 last colonoscopy.  Plan for colonoscopy in the near future.  I have discussed the risks, alternatives, benefits with regards to but not limited to the risk of reaction to medication, bleeding, infection, perforation and the patient is agreeable to proceed. Written consent to be obtained.

## 2017-12-23 NOTE — Progress Notes (Signed)
CC'ED TO PCP 

## 2017-12-23 NOTE — Telephone Encounter (Signed)
LMOVM to schedule to [facility]CS W/ RMR.

## 2017-12-26 NOTE — Telephone Encounter (Signed)
LMOVM

## 2017-12-27 ENCOUNTER — Encounter: Payer: Self-pay | Admitting: *Deleted

## 2017-12-27 NOTE — Telephone Encounter (Signed)
LMOVM. Mailing letter advising to contact us to get procedure scheduled.

## 2017-12-28 ENCOUNTER — Other Ambulatory Visit: Payer: Self-pay | Admitting: *Deleted

## 2017-12-28 ENCOUNTER — Encounter: Payer: Self-pay | Admitting: *Deleted

## 2017-12-28 DIAGNOSIS — R195 Other fecal abnormalities: Secondary | ICD-10-CM

## 2017-12-28 DIAGNOSIS — D369 Benign neoplasm, unspecified site: Secondary | ICD-10-CM

## 2017-12-28 MED ORDER — PEG 3350-KCL-NA BICARB-NACL 420 G PO SOLR: 4000.0000 mL | Freq: Once | ORAL | 0 refills | Status: AC

## 2017-12-28 NOTE — Telephone Encounter (Signed)
Patient daughter called back and is scheduled for 02/21/18 at 10:30am. Prep sent into the pharmacy. Instructions mailed to pt address. Nothing further needed

## 2018-01-12 DIAGNOSIS — J449 Chronic obstructive pulmonary disease, unspecified: Secondary | ICD-10-CM | POA: Diagnosis not present

## 2018-01-17 DIAGNOSIS — J449 Chronic obstructive pulmonary disease, unspecified: Secondary | ICD-10-CM | POA: Diagnosis not present

## 2018-01-31 ENCOUNTER — Ambulatory Visit (INDEPENDENT_AMBULATORY_CARE_PROVIDER_SITE_OTHER): Payer: Medicare PPO

## 2018-01-31 ENCOUNTER — Ambulatory Visit
Admission: EM | Admit: 2018-01-31 | Discharge: 2018-01-31 | Disposition: A | Discharge status: Home / Self Care | Payer: Medicare PPO | Attending: Family Medicine | Admitting: Family Medicine

## 2018-01-31 ENCOUNTER — Encounter: Payer: Self-pay | Admitting: *Deleted

## 2018-01-31 DIAGNOSIS — M25561 Pain in right knee: Secondary | ICD-10-CM

## 2018-01-31 DIAGNOSIS — W19XXXA Unspecified fall, initial encounter: Secondary | ICD-10-CM | POA: Diagnosis not present

## 2018-01-31 DIAGNOSIS — S8991XA Unspecified injury of right lower leg, initial encounter: Secondary | ICD-10-CM | POA: Diagnosis not present

## 2018-01-31 DIAGNOSIS — Z993 Dependence on wheelchair: Secondary | ICD-10-CM

## 2018-01-31 NOTE — ED Triage Notes (Signed)
Right knee is weak and "gives out" intermittently. Pt fell due to this last Thursday and again today. Right knee is edematous and painful. Denies other injuries.

## 2018-01-31 NOTE — ED Provider Notes (Signed)
[facility]    CSN: [id number] Arrival date & time: 01/31/18  1028  History   Chief Complaint Chief Complaint  Patient presents with  . Leg Injury   HPI  77 year old female presents with right knee pain after suffering a fall this morning.  Family member states that she is wheelchair-bound.  She only gets up to use the restroom.  She was in the restroom this morning and fell.  It is unclear of exactly how she fell.  She is complaining of right knee pain.  patient states that her knee just "gave way".  Patient in mild pain currently.  Does not seem to be exacerbated by range of motion.  No reports of redness.  She does report some swelling.  Pain is located predominantly in the anterior aspect of the knee.  No other associated symptoms.  No other complaints at this time.  Past Medical History:  Diagnosis Date  . Abnormality of gait 06/06/2014  . Allergic rhinitis   . Anemia   . Ankle fracture, right   . Anxiety   . Arm fracture, left   . Arthritis    abnormal gait   . COPD (chronic obstructive pulmonary disease) (HCC)    chronic CO2 retention, decreased DLCO   . Cystic acne    adult   . 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 Degenerative disc disease, lumbar    L5 nerve impingement   . Depression   . DNR (do not resuscitate)   . GERD (gastroesophageal reflux disease)   . History of colon surgery    right hemicolectomy for high-grade adenomas in right colon 2013  . Hyperglycemia   . Hypertension   . Low back pain   . Mediastinal lymphadenopathy 1/9   resolving   . Memory difficulty 09/20/2014   mild dementia  . Onychomycosis   . OSA on CPAP   . Sleep apnea    stop bang score 6    Patient Active Problem List   Diagnosis Date Noted  . Heme positive stool 10/10/2017  . COPD with hypoxia ([place]) 07/02/2017  . Demand ischemia ([place]) 01/30/2017  . Dementia 01/28/2017  . Bimalleolar ankle fracture 01/13/2016  . Annual physical exam 03/13/2015    . Memory difficulty 09/20/2014  . Abnormality of gait 06/06/2014  . Cervical neck pain with evidence of disc disease 05/20/2014  . Chronic right shoulder pain 10/30/2013  . Multiple adenomatous polyps 02/15/2013  . Spinal stenosis 09/29/2011  . Falls frequently 09/01/2011  . ALLERGIC RHINITIS, SEASONAL 04/10/2010  . Hyperlipidemia LDL goal <100 01/25/2007  . Normocytic anemia 10/05/2006  . Obstructive sleep apnea 10/05/2006  . Essential hypertension 10/05/2006  . GERD 10/05/2006  . Generalized osteoarthritis of multiple sites 10/05/2006    Past Surgical History:  Procedure Laterality Date  . ABDOMINAL HYSTERECTOMY  1976   secondary to bleeding   . APPENDECTOMY    . COLON RESECTION  08/18/2012   Procedure: HAND ASSISTED LAPAROSCOPIC COLON RESECTION;  Surgeon: Jamesetta So, MD;  Location: [facility];  Service: General;  Laterality: N/A;  . COLONOSCOPY  07/20/2012   Dr. Gala Romney: multiple colonic polyps with large polyps on right side, s/p saline-assisted debulking piecemeal polypectomy and ablation. Not all removed. Path with tubulovillous adenomas, high grade dysplasia.   . COLONOSCOPY N/A 03/09/2013   [id number] polyps-tubular adenomas. S/p right hemicolectomy. next tcs 02/2018  . epidural steroids    . ESOPHAGOGASTRODUODENOSCOPY (EGD) WITH ESOPHAGEAL DILATION N/A 02/06/2014  Procedure: ESOPHAGOGASTRODUODENOSCOPY (EGD) WITH ESOPHAGEAL DILATION;  Surgeon: Daneil Dolin, MD;  Location: [facility];  Service: Endoscopy;  Laterality: N/A;  9:30  . OOPHORECTOMY  1976  . ORIF ANKLE FRACTURE  01/18/2012   Procedure: OPEN REDUCTION INTERNAL FIXATION (ORIF) ANKLE FRACTURE;  Surgeon: Arther Abbott, MD;  Location: [facility];  Service: Orthopedics;  Laterality: Left;  . ORIF ANKLE FRACTURE Right 01/13/2016   Procedure: OPEN REDUCTION INTERNAL FIXATION (ORIF) ANKLE FRACTURE;  Surgeon: Carole Civil, MD;  Location: [facility];  Service: Orthopedics;  Laterality: Right;    OB History    Gravida Para  Term Preterm AB Living   5 4 4   1      SAB TAB Ectopic Multiple Live Births   1               Home Medications    Prior to Admission medications   Medication Sig Start Date End Date Taking? Authorizing Provider  acetaminophen (TYLENOL) 500 MG tablet Take 1,000 mg by mouth 2 (two) times daily.    Yes [provider]  Albuterol Sulfate (PROAIR RESPICLICK) 101 (90 BASE) MCG/ACT AEPB Inhale 2 puffs into the lungs every 8 (eight) hours as needed. 11/11/15  Yes [name], MD  alendronate (FOSAMAX) 70 MG tablet TAKE 1 TABLET ONE TIME A WEEK. TAKE WITH A FULL GLASS OF WATER ON AN EMPTY STOMACH. 05/02/17  Yes [name], MD  arformoterol (BROVANA) 15 MCG/2ML NEBU Take 2 mLs (15 mcg total) by nebulization 2 (two) times daily. 06/17/17  Yes [name], MD  aspirin EC 81 MG tablet Take 81 mg by mouth daily.   Yes [provider]  calcium-vitamin D (OSCAL WITH D) 500-200 MG-UNIT per tablet Take 1 tablet by mouth daily.    Yes [provider]  donepezil (ARICEPT) 10 MG tablet TAKE 1 TABLET AT BEDTIME  07/28/17  Yes [name], MD  ENSURE (ENSURE) Take 1 Can by mouth daily as needed. Take one by mouth with meals    Yes [provider]  FLUoxetine (PROZAC) 40 MG capsule TAKE 1 CAPSULE EVERY DAY 12/22/17  Yes [name], MD  loratadine (CLARITIN) 10 MG tablet Take 10 mg by mouth daily as needed for allergies.    Yes [provider]  Multiple Vitamins-Minerals (CEROVITE SENIOR) TABS Take 1 tablet by mouth daily.    Yes [provider]  omeprazole (PRILOSEC) 40 MG capsule TAKE 1 CAPSULE EVERY DAY 12/22/17  Yes [name], MD  verapamil (CALAN-SR) 240 MG CR tablet TAKE 1 TABLET AT BEDTIME 12/22/17  Yes [name], MD    Family History Family History  Problem Relation Age of Onset  . Stomach cancer Father   . Cancer Father   . Cancer Mother        brain   . Breast cancer Mother   . Arthritis  Unknown     Social History Social History   Tobacco Use  . Smoking status: Former Smoker    Packs/day: 1.50    Years: 40.00    Pack years: 60.00    Types: Cigarettes    Last attempt to quit: 06/21/2006    Years since quitting: 11.6  . Smokeless tobacco: Never Used  Substance Use Topics  . Alcohol use: No  . Drug use: No     Allergies   Fenofibrate; Pravastatin sodium; Sulfonamide derivatives; and Ciprofloxacin   Review of Systems Review of Systems  Constitutional: Negative.  Musculoskeletal:       Fall, Right knee pain.   Physical Exam Triage Vital Signs ED Triage Vitals  Enc Vitals Group     BP 01/31/18 1053 130/68     Pulse Rate 01/31/18 1053 83     Resp 01/31/18 1053 16     Temp 01/31/18 1053 98.9 F (37.2 C)     Temp Source 01/31/18 1053 Oral     SpO2 01/31/18 1053 96 %     Weight 01/31/18 1056 200 lb (90.7 kg)     Height 01/31/18 1056 6\' 1"  (1.854 m)     Head Circumference --      Peak Flow --      Pain Score --      Pain Loc --      Pain Edu? --      Excl. in [place]? --    Updated Vital Signs BP 130/68 (BP Location: Left Arm)   Pulse 83   Temp 98.9 F (37.2 C) (Oral)   Resp 16   Ht 6\' 1"  (1.854 m)   Wt 200 lb (90.7 kg)   SpO2 96%   BMI 26.39 kg/m     Physical Exam  Constitutional: She appears well-developed. No distress.  Pulmonary/Chest: Effort normal. No respiratory distress.  Musculoskeletal:  Right knee - no discrete areas of tenderness.  Patient has apparent extensive osteoarthritis.  No erythema.  No swelling.  Ligaments intact.  Neurological: She is alert.  Skin: Skin is warm.  No erythema.  Psychiatric: She has a normal mood and affect. Her behavior is normal.  Nursing note and vitals reviewed.  UC Treatments / Results  Labs (all labs ordered are listed, but only abnormal results are displayed) Labs Reviewed - No data to display  EKG  EKG Interpretation None       Radiology Dg Knee Complete 4 Views Right  Result Date:  01/31/2018 CLINICAL DATA:  Recent injury with weakness and pain EXAM: RIGHT KNEE - COMPLETE 4+ VIEW COMPARISON:  None. FINDINGS: Frontal, lateral, and bilateral oblique views were obtained. There is no evident fracture or dislocation. There is a small joint effusion. There is moderate generalized joint space narrowing. There is spurring in all compartments. There is chondrocalcinosis. IMPRESSION: Moderate generalized osteoarthritic change with small joint effusion. There is chondrocalcinosis, a finding that may be seen with osteoarthritis or with calcium pyrophosphate deposition disease. No fracture or dislocation evident. Electronically Signed   By: Lowella Grip III M.D.   On: 01/31/2018 11:42    Procedures Procedures (including critical care time)  Medications Ordered in UC Medications - No data to display   Initial Impression / Assessment and Plan / UC Course  I have reviewed the triage vital signs and the nursing notes.  Pertinent labs & imaging results that were available during my care of the patient were reviewed by me and considered in my medical decision making (see chart for details).    77 year old female presents with right knee pain following a fall.  X-ray negative for acute fracture.  Advised rest, ice, elevation.  Tylenol as needed.  Supportive care.  Final Clinical Impressions(s) / UC Diagnoses   Final diagnoses:  Acute pain of right knee    ED Discharge Orders    None     Controlled Substance Prescriptions [place] Controlled Substance Registry consulted? Not Applicable   Coral Spikes, DO 01/31/18 1153

## 2018-01-31 NOTE — Discharge Instructions (Signed)
Rest, Ice, Elevation.  Tylenol 1000 mg three times daily.  Take care  Dr. Lacinda Axon

## 2018-02-09 DIAGNOSIS — J449 Chronic obstructive pulmonary disease, unspecified: Secondary | ICD-10-CM | POA: Diagnosis not present

## 2018-02-15 ENCOUNTER — Telehealth: Payer: Self-pay

## 2018-02-15 NOTE — Telephone Encounter (Signed)
noted 

## 2018-02-15 NOTE — Telephone Encounter (Signed)
Pt's daughter [name]) called office to cancel TCS scheduled for 02/21/18. She is having mobility issues and problems with her knee. Called and informed endo scheduler to cancel procedure.

## 2018-02-17 DIAGNOSIS — J449 Chronic obstructive pulmonary disease, unspecified: Secondary | ICD-10-CM | POA: Diagnosis not present

## 2018-02-20 ENCOUNTER — Ambulatory Visit (INDEPENDENT_AMBULATORY_CARE_PROVIDER_SITE_OTHER): Payer: Medicare PPO

## 2018-02-20 VITALS — BP 130/80 | HR 85 | Temp 97.8°F | Resp 16 | Ht 73.0 in | Wt 217.0 lb

## 2018-02-20 DIAGNOSIS — Z0000 Encounter for general adult medical examination without abnormal findings: Secondary | ICD-10-CM

## 2018-02-20 NOTE — Progress Notes (Signed)
Subjective:   [name] is a 78 y.o. female who presents for Medicare Annual (Subsequent) preventive examination.  Review of Systems:   Cardiac Risk Factors include: sedentary lifestyle     Objective:     Vitals: BP 130/80 (BP Location: Left Arm, Patient Position: Sitting, Cuff Size: Normal)   Pulse 85   Temp 97.8 F (36.6 C) (Temporal)   Resp 16   Ht 6\' 1"  (1.854 m)   Wt 217 lb (98.4 kg)   SpO2 93%   BMI 28.63 kg/m   Body mass index is 28.63 kg/m.  Advanced Directives 02/20/2018 03/10/2017 01/28/2017 01/28/2017 11/10/2016 09/20/2016 09/20/2016  Does Patient Have a Medical Advance Directive? Yes Yes No No No Yes Yes  Type of Advance Directive - [place] of facility DNR (pink MOST or yellow form)  Does patient want to make changes to medical advance directive? No - Patient declined Yes (MAU/Ambulatory/Procedural Areas - Information given) - - - No - Patient declined -  Copy of [place] in Chart? - No - copy requested - - - No - copy requested No - copy requested  Would patient like information on creating a medical advance directive? - - No - Patient declined - Yes (ED - Information included in AVS) No - patient declined information -  Pre-existing out of facility DNR order (yellow form or pink MOST form) - - - - - - -    Tobacco Social History   Tobacco Use  Smoking Status Former Smoker  . Packs/day: 1.50  . Years: 40.00  . Pack years: 60.00  . Types: Cigarettes  . Last attempt to quit: 06/21/2006  . Years since quitting: 11.6  Smokeless Tobacco Never Used     Counseling given: Yes   Clinical Intake:  Pre-visit preparation completed: Yes  Pain : 0-10 Pain Score: 5      Nutritional Status: BMI 25 -29 Overweight Diabetes: No  How often do you need to have someone help you when you read instructions, pamphlets, or other written materials from your doctor or pharmacy?: 1 -  Never  Interpreter Needed?: No     Past Medical History:  Diagnosis Date  . Abnormality of gait 06/06/2014  . Allergic rhinitis   . Anemia   . Ankle fracture, right   . Anxiety   . Arm fracture, left   . Arthritis    abnormal gait   . COPD (chronic obstructive pulmonary disease) (HCC)    chronic CO2 retention, decreased DLCO   . Cystic acne    adult   . 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 Degenerative disc disease, lumbar    L5 nerve impingement   . Depression   . DNR (do not resuscitate)   . GERD (gastroesophageal reflux disease)   . History of colon surgery    right hemicolectomy for high-grade adenomas in right colon 2013  . Hyperglycemia   . Hypertension   . Low back pain   . Mediastinal lymphadenopathy 1/9   resolving   . Memory difficulty 09/20/2014   mild dementia  . Onychomycosis   . OSA on CPAP   . Sleep apnea    stop bang score 6   Past Surgical History:  Procedure Laterality Date  . ABDOMINAL HYSTERECTOMY  1976   secondary to bleeding   . APPENDECTOMY    . COLON RESECTION  08/18/2012  Procedure: HAND ASSISTED LAPAROSCOPIC COLON RESECTION;  Surgeon: Jamesetta So, MD;  Location: [facility];  Service: General;  Laterality: N/A;  . COLONOSCOPY  07/20/2012   Dr. Gala Romney: multiple colonic polyps with large polyps on right side, s/p saline-assisted debulking piecemeal polypectomy and ablation. Not all removed. Path with tubulovillous adenomas, high grade dysplasia.   . COLONOSCOPY N/A 03/09/2013   [id number] polyps-tubular adenomas. S/p right hemicolectomy. next tcs 02/2018  . epidural steroids    . ESOPHAGOGASTRODUODENOSCOPY (EGD) WITH ESOPHAGEAL DILATION N/A 02/06/2014   Procedure: ESOPHAGOGASTRODUODENOSCOPY (EGD) WITH ESOPHAGEAL DILATION;  Surgeon: Daneil Dolin, MD;  [facility];  Service: Endoscopy;  Laterality: N/A;  9:30  . OOPHORECTOMY  1976  . ORIF ANKLE FRACTURE  01/18/2012   Procedure: OPEN REDUCTION INTERNAL FIXATION (ORIF)  ANKLE FRACTURE;  Surgeon: Arther Abbott, MD;  Location: [facility];  Service: Orthopedics;  Laterality: Left;  . ORIF ANKLE FRACTURE Right 01/13/2016   Procedure: OPEN REDUCTION INTERNAL FIXATION (ORIF) ANKLE FRACTURE;  Surgeon: Carole Civil, MD;  Location: [facility];  Service: Orthopedics;  Laterality: Right;   Family History  Problem Relation Age of Onset  . Stomach cancer Father   . Cancer Father   . Cancer Mother        brain   . Breast cancer Mother   . Arthritis Unknown    Social History   Socioeconomic History  . Marital status: Widowed    Spouse name: Not on file  . Number of children: 4  . Years of education: college 1  . Highest education level: Not on file  Occupational History  . Occupation: employed in the Pharmacist, hospital: RETIRED  Social Needs  . Financial resource strain: Not hard at all  . Food insecurity:    Worry: Never true    Inability: Never true  . Transportation needs:    Medical: No    Non-medical: No  Tobacco Use  . Smoking status: Former Smoker    Packs/day: 1.50    Years: 40.00    Pack years: 60.00    Types: Cigarettes    Last attempt to quit: 06/21/2006    Years since quitting: 11.6  . Smokeless tobacco: Never Used  Substance and Sexual Activity  . Alcohol use: No  . Drug use: No  . Sexual activity: Never  Lifestyle  . Physical activity:    Days per week: 7 days    Minutes per session: 30 min  . Stress: Not at all  Relationships  . Social connections:    Talks on phone: More than three times a week    Gets together: More than three times a week    Attends religious service: More than 4 times per year    Active member of club or organization: No    Attends meetings of clubs or organizations: Never    Relationship status: Divorced  Other Topics Concern  . Not on file  Social History Narrative   Lives at home alone   Right-handed   Drinks 2 or more cups of coffee each morning    Outpatient Encounter Medications as  of 02/20/2018  Medication Sig  . acetaminophen (TYLENOL) 500 MG tablet Take 1,000 mg by mouth 2 (two) times daily.   . Albuterol Sulfate (PROAIR RESPICLICK) 852 (90 BASE) MCG/ACT AEPB Inhale 2 puffs into the lungs every 8 (eight) hours as needed.  [name] alendronate (FOSAMAX) 70 MG tablet TAKE 1 TABLET ONE TIME A WEEK. TAKE  WITH A FULL GLASS OF WATER ON AN EMPTY STOMACH.  [name] arformoterol (BROVANA) 15 MCG/2ML NEBU Take 2 mLs (15 mcg total) by nebulization 2 (two) times daily.  [name] aspirin EC 81 MG tablet Take 81 mg by mouth daily.  [name] atorvastatin (LIPITOR) 10 MG tablet   . calcium-vitamin D (OSCAL WITH D) 500-200 MG-UNIT per tablet Take 1 tablet by mouth daily.   [name] donepezil (ARICEPT) 10 MG tablet TAKE 1 TABLET AT BEDTIME   . ENSURE (ENSURE) Take 1 Can by mouth daily as needed. Take one by mouth with meals   . FLUoxetine (PROZAC) 40 MG capsule TAKE 1 CAPSULE EVERY DAY  . loratadine (CLARITIN) 10 MG tablet Take 10 mg by mouth daily as needed for allergies.   . Multiple Vitamins-Minerals (CEROVITE SENIOR) TABS Take 1 tablet by mouth daily.   [name] omeprazole (PRILOSEC) 40 MG capsule TAKE 1 CAPSULE EVERY DAY  . verapamil (CALAN-SR) 240 MG CR tablet TAKE 1 TABLET AT BEDTIME   No facility-administered encounter medications on file as of 02/20/2018.     Activities of Daily Living In your present state of health, do you have any difficulty performing the following activities: 02/20/2018 03/10/2017  Hearing? N N  Vision? N N  Difficulty concentrating or making decisions? Tempie Donning  Walking or climbing stairs? Y Y  Dressing or bathing? Y Y  Doing errands, shopping? Tempie Donning  Preparing Food and eating ? Y Y  Using the Toilet? Y N  In the past six months, have you accidently leaked urine? N Y  Do you have problems with loss of bowel control? N Y  Managing your Medications? Y Y  Managing your Finances? Tempie Donning  Housekeeping or managing your Housekeeping? Tempie Donning  Some recent data might be hidden    Patient Care Team: [name], MD as PCP - General Josue Hector, MD as Attending Physician (Cardiology) Kathrynn Ducking, MD as Consulting Physician (Neurology) Sinda Du, MD as Consulting Physician (Pulmonary Disease) Carole Civil, MD as Consulting Physician (Orthopedic Surgery)    Assessment:   This is a routine wellness examination for Rhylan.  Exercise Activities and Dietary recommendations Current Exercise Habits: Home exercise routine, Type of exercise: stretching, Time (Minutes): 30, Frequency (Times/Week): 7, Weekly Exercise (Minutes/Week): 210, Intensity: Mild, Exercise limited by: orthopedic condition(s)  Goals    . Exercise 3x per week (30 min per time)     Starting 11/15/2016 patient would like to start chair exercises 3 times a week at 30 minutes a time.       Fall Risk Fall Risk  02/20/2018 06/30/2017 03/10/2017 02/14/2017 11/10/2016  Falls in the past year? Yes Yes Yes Yes Yes  Number falls in past yr: 2 or more 2 or more 2 or more 2 or more 2 or more  Injury with Fall? Yes Yes No - Yes  Risk Factor Category  High Fall Risk - High Fall Risk - High Fall Risk  Risk for fall due to : History of fall(s);Impaired balance/gait;Impaired mobility - History of fall(s);Impaired balance/gait;Impaired mobility - History of fall(s);Impaired balance/gait;Impaired mobility  Follow up Falls prevention discussed - Education provided;Falls prevention discussed - Falls prevention discussed   Is the patient's home free of loose throw rugs in walkways, pet beds, electrical cords, etc?   yes      Grab bars in the bathroom? yes      Handrails on the stairs?   yes      Adequate lighting?  yes  Depression Screen PHQ 2/9 Scores 02/20/2018 03/10/2017 11/10/2016 12/15/2015  PHQ - 2 Score 0 0 0 0  PHQ- 9 Score - - - -     Cognitive Function MMSE - Mini Mental State Exam 03/10/2017 04/21/2016 10/21/2015 09/20/2014  Orientation to time 3 5 3 4   Orientation to Place 5 4 3 3   Registration 3 3 3 3     Attention/ Calculation 4 4 2 5   Recall 1 2 0 2  Language- name 2 objects 2 2 2 2   Language- repeat 1 1 1 1   Language- follow 3 step command 3 3 3 3   Language- read & follow direction 1 1 1 1   Write a sentence 1 1 1 1   Copy design 1 1 1 1   Total score 25 27 20 26      6CIT Screen 11/10/2016  What Year? 0 points  What month? 0 points  What time? 0 points  Count back from 20 0 points  Months in reverse 0 points  Repeat phrase 0 points  Total Score 0    Immunization History  Administered Date(s) Administered  . Influenza Split 08/25/2011, 07/25/2012  . Influenza Whole 09/12/2006, 08/25/2007, 08/01/2008, 08/20/2009, 08/20/2010  . Influenza,inj,Quad PF,6+ Mos 08/21/2013, 09/04/2014, 08/13/2015, 07/08/2016, 09/23/2017  . Pneumococcal Conjugate-13 06/20/2014  . Pneumococcal Polysaccharide-23 09/12/2006, 08/20/2010, 04/16/2011  . Td 08/20/2010  . Zoster 12/26/2013    Qualifies for Shingles Vaccine?  Yes. We discussed the importance of this as wel as the need to call insurance for coverage.   Screening Tests Health Maintenance  Topic Date Due  . COLONOSCOPY  03/09/2018  . INFLUENZA VACCINE  06/15/2018  . TETANUS/TDAP  08/20/2020  . DEXA SCAN  Completed  . PNA vac Low Risk Adult  Completed    Cancer Screenings: Lung: Low Dose CT Chest recommended if Age 36-80 years, 30 pack-year currently smoking OR have quit w/in 15years. Patient does qualify. Breast:  Up to date on Mammogram? Yes   Up to date of Bone Density/Dexa? Yes Colorectal: She is due for a colonoscopy, but due to age, I will defer this to Dr. Moshe Cipro     Plan:      I have personally reviewed and noted the following in the patient's chart:   . Medical and social history . Use of alcohol, tobacco or illicit drugs  . Current medications and supplements . Functional ability and status . Nutritional status . Physical activity . Advanced directives . List of other physicians . Hospitalizations, surgeries, and  ER visits in previous 12 months . Vitals . Screenings to include cognitive, depression, and falls . Referrals and appointments  In addition, I have reviewed and discussed with patient certain preventive protocols, quality metrics, and best practice recommendations. A written personalized care plan for preventive services as well as general preventive health recommendations were provided to patient.     Merceda Elks, LPN  [id number]

## 2018-02-20 NOTE — Patient Instructions (Signed)
[name] , Thank you for taking time to come for your Medicare Wellness Visit. I appreciate your ongoing commitment to your health goals. Please review the following plan we discussed and let me know if I can assist you in the future.   Screening recommendations/referrals: Colonoscopy: You are due for this. I will defer this to Dr. Moshe Cipro Mammogram: Due 06/2018 Bone Density: Done Recommended yearly ophthalmology/optometry visit for glaucoma screening and checkup Recommended yearly dental visit for hygiene and checkup  Vaccinations: Influenza vaccine: Due fall 2019 Pneumococcal vaccine: Due 2020 Tdap vaccine: Due 2021 Shingles vaccine: Discuss updating to Shingrix. Also, call your insurance for coverage.    Advanced directives: Discussed today in office  Conditions/risks identified: None  Next appointment: 04/11/76   Preventive Care 77 Years and Older, Female Preventive care refers to lifestyle choices and visits with your health care provider that can promote health and wellness. What does preventive care include?  A yearly physical exam. This is also called an annual well check.  Dental exams once or twice a year.  Routine eye exams. Ask your health care provider how often you should have your eyes checked.  Personal lifestyle choices, including:  Daily care of your teeth and gums.  Regular physical activity.  Eating a healthy diet.  Avoiding tobacco and drug use.  Limiting alcohol use.  Practicing safe sex.  Taking low-dose aspirin every day.  Taking vitamin and mineral supplements as recommended by your health care provider. What happens during an annual well check? The services and screenings done by your health care provider during your annual well check will depend on your age, overall health, lifestyle risk factors, and family history of disease. Counseling  Your health care provider may ask you questions about your:  Alcohol use.  Tobacco  use.  Drug use.  Emotional well-being.  Home and relationship well-being.  Sexual activity.  Eating habits.  History of falls.  Memory and ability to understand (cognition).  Work and work Statistician.  Reproductive health. Screening  You may have the following tests or measurements:  Height, weight, and BMI.  Blood pressure.  Lipid and cholesterol levels. These may be checked every 5 years, or more frequently if you are over 49 years old.  Skin check.  Lung cancer screening. You may have this screening every year starting at age 40 if you have a 30-pack-year history of smoking and currently smoke or have quit within the past 15 years.  Fecal occult blood test (FOBT) of the stool. You may have this test every year starting at age 28.  Flexible sigmoidoscopy or colonoscopy. You may have a sigmoidoscopy every 5 years or a colonoscopy every 10 years starting at age 77.  Hepatitis C blood test.  Hepatitis B blood test.  Sexually transmitted disease (STD) testing.  Diabetes screening. This is done by checking your blood sugar (glucose) after you have not eaten for a while (fasting). You may have this done every 1-3 years.  Bone density scan. This is done to screen for osteoporosis. You may have this done starting at age 36.  Mammogram. This may be done every 1-2 years. Talk to your health care provider about how often you should have regular mammograms. Talk with your health care provider about your test results, treatment options, and if necessary, the need for more tests. Vaccines  Your health care provider may recommend certain vaccines, such as:  Influenza vaccine. This is recommended every year.  Tetanus, diphtheria, and acellular pertussis (Tdap,  Td) vaccine. You may need a Td booster every 10 years.  Zoster vaccine. You may need this after age 28.  Pneumococcal 13-valent conjugate (PCV13) vaccine. One dose is recommended after age 53.  Pneumococcal  polysaccharide (PPSV23) vaccine. One dose is recommended after age 77. Talk to your health care provider about which screenings and vaccines you need and how often you need them. This information is not intended to replace advice given to you by your health care provider. Make sure you discuss any questions you have with your health care provider. Document Released: 11/28/2015 Document Revised: 07/21/2016 Document Reviewed: 09/02/2015 Elsevier Interactive Patient Education  2017 [place] Prevention in the Home Falls can cause injuries. They can happen to people of all ages. There are many things you can do to make your home safe and to help prevent falls. What can I do on the outside of my home?  Regularly fix the edges of walkways and driveways and fix any cracks.  Remove anything that might make you trip as you walk through a door, such as a raised step or threshold.  Trim any bushes or trees on the path to your home.  Use bright outdoor lighting.  Clear any walking paths of anything that might make someone trip, such as rocks or tools.  Regularly check to see if handrails are loose or broken. Make sure that both sides of any steps have handrails.  Any raised decks and porches should have guardrails on the edges.  Have any leaves, snow, or ice cleared regularly.  Use sand or salt on walking paths during winter.  Clean up any spills in your garage right away. This includes oil or grease spills. What can I do in the bathroom?  Use night lights.  Install grab bars by the toilet and in the tub and shower. Do not use towel bars as grab bars.  Use non-skid mats or decals in the tub or shower.  If you need to sit down in the shower, use a plastic, non-slip stool.  Keep the floor dry. Clean up any water that spills on the floor as soon as it happens.  Remove soap buildup in the tub or shower regularly.  Attach bath mats securely with double-sided non-slip rug  tape.  Do not have throw rugs and other things on the floor that can make you trip. What can I do in the bedroom?  Use night lights.  Make sure that you have a light by your bed that is easy to reach.  Do not use any sheets or blankets that are too big for your bed. They should not hang down onto the floor.  Have a firm chair that has side arms. You can use this for support while you get dressed.  Do not have throw rugs and other things on the floor that can make you trip. What can I do in the kitchen?  Clean up any spills right away.  Avoid walking on wet floors.  Keep items that you use a lot in easy-to-reach places.  If you need to reach something above you, use a strong step stool that has a grab bar.  Keep electrical cords out of the way.  Do not use floor polish or wax that makes floors slippery. If you must use wax, use non-skid floor wax.  Do not have throw rugs and other things on the floor that can make you trip. What can I do with my stairs?  Do not  leave any items on the stairs.  Make sure that there are handrails on both sides of the stairs and use them. Fix handrails that are broken or loose. Make sure that handrails are as long as the stairways.  Check any carpeting to make sure that it is firmly attached to the stairs. Fix any carpet that is loose or worn.  Avoid having throw rugs at the top or bottom of the stairs. If you do have throw rugs, attach them to the floor with carpet tape.  Make sure that you have a light switch at the top of the stairs and the bottom of the stairs. If you do not have them, ask someone to add them for you. What else can I do to help prevent falls?  Wear shoes that:  Do not have high heels.  Have rubber bottoms.  Are comfortable and fit you well.  Are closed at the toe. Do not wear sandals.  If you use a stepladder:  Make sure that it is fully opened. Do not climb a closed stepladder.  Make sure that both sides of the  stepladder are locked into place.  Ask someone to hold it for you, if possible.  Clearly mark and make sure that you can see:  Any grab bars or handrails.  First and last steps.  Where the edge of each step is.  Use tools that help you move around (mobility aids) if they are needed. These include:  Canes.  Walkers.  Scooters.  Crutches.  Turn on the lights when you go into a dark area. Replace any light bulbs as soon as they burn out.  Set up your furniture so you have a clear path. Avoid moving your furniture around.  If any of your floors are uneven, fix them.  If there are any pets around you, be aware of where they are.  Review your medicines with your doctor. Some medicines can make you feel dizzy. This can increase your chance of falling. Ask your doctor what other things that you can do to help prevent falls. This information is not intended to replace advice given to you by your health care provider. Make sure you discuss any questions you have with your health care provider. Document Released: 08/28/2009 Document Revised: 04/08/2016 Document Reviewed: 12/06/2014 Elsevier Interactive Patient Education  2017 [place] for Adults  A healthy lifestyle and preventive care can promote health and wellness. Preventive health guidelines for adults include the following key practices.  . A routine yearly physical is a good way to check with your health care provider about your health and preventive screening. It is a chance to share any concerns and updates on your health and to receive a thorough exam.  . Visit your dentist for a routine exam and preventive care every 6 months. Brush your teeth twice a day and floss once a day. Good oral hygiene prevents tooth decay and gum disease.  . The frequency of eye exams is based on your age, health, family medical history, use  of contact lenses, and other factors. Follow your health care provider's  ecommendations for frequency of eye exams.  . Eat a healthy diet. Foods like vegetables, fruits, whole grains, low-fat dairy products, and lean protein foods contain the nutrients you need without too many calories. Decrease your intake of foods high in solid fats, added sugars, and salt. Eat the right amount of calories for you. Get information about a proper diet from your health  care provider, if necessary.  . Regular physical exercise is one of the most important things you can do for your health. Most adults should get at least 150 minutes of moderate-intensity exercise (any activity that increases your heart rate and causes you to sweat) each week. In addition, most adults need muscle-strengthening exercises on 2 or more days a week.  Silver Sneakers may be a benefit available to you. To determine eligibility, you may visit the website: www.silversneakers.com or contact program at (432)092-8184 Mon-Fri between 8AM-8PM.   . Maintain a healthy weight. The body mass index (BMI) is a screening tool to identify possible weight problems. It provides an estimate of body fat based on height and weight. Your health care provider can find your BMI and can help you achieve or maintain a healthy weight.   For adults 20 years and older: ? A BMI below 18.5 is considered underweight. ? A BMI of 18.5 to 24.9 is normal. ? A BMI of 25 to 29.9 is considered overweight. ? A BMI of 30 and above is considered obese.   . Maintain normal blood lipids and cholesterol levels by exercising and minimizing your intake of saturated fat. Eat a balanced diet with plenty of fruit and vegetables. Blood tests for lipids and cholesterol should begin at age 62 and be repeated every 5 years. If your lipid or cholesterol levels are high, you are over 50, or you are at high risk for heart disease, you may need your cholesterol levels checked more frequently. Ongoing high lipid and cholesterol levels should be treated with medicines  if diet and exercise are not working.  . If you smoke, find out from your health care provider how to quit. If you do not use tobacco, please do not start.  . If you choose to drink alcohol, please do not consume more than 2 drinks per day. One drink is considered to be 12 ounces (355 mL) of beer, 5 ounces (148 mL) of wine, or 1.5 ounces (44 mL) of liquor.  . If you are 77-47 years old, ask your health care provider if you should take aspirin to prevent strokes.  . Use sunscreen. Apply sunscreen liberally and repeatedly throughout the day. You should seek shade when your shadow is shorter than you. Protect yourself by wearing long sleeves, pants, a wide-brimmed hat, and sunglasses year round, whenever you are outdoors.  . Once a month, do a whole body skin exam, using a mirror to look at the skin on your back. Tell your health care provider of new moles, moles that have irregular borders, moles that are larger than a pencil eraser, or moles that have changed in shape or color.

## 2018-02-21 ENCOUNTER — Encounter (HOSPITAL_COMMUNITY): Admission: RE | Payer: Self-pay | Source: Ambulatory Visit

## 2018-02-21 ENCOUNTER — Ambulatory Visit (HOSPITAL_COMMUNITY): Admission: RE | Admit: 2018-02-21 | Payer: Medicare PPO | Source: Ambulatory Visit | Admitting: Internal Medicine

## 2018-02-21 SURGERY — COLONOSCOPY
Anesthesia: Moderate Sedation

## 2018-02-22 ENCOUNTER — Other Ambulatory Visit: Payer: Self-pay | Admitting: Family Medicine

## 2018-02-22 DIAGNOSIS — R413 Other amnesia: Secondary | ICD-10-CM

## 2018-03-12 DIAGNOSIS — J449 Chronic obstructive pulmonary disease, unspecified: Secondary | ICD-10-CM | POA: Diagnosis not present

## 2018-03-19 DIAGNOSIS — J449 Chronic obstructive pulmonary disease, unspecified: Secondary | ICD-10-CM | POA: Diagnosis not present

## 2018-04-11 ENCOUNTER — Ambulatory Visit (INDEPENDENT_AMBULATORY_CARE_PROVIDER_SITE_OTHER): Payer: Medicare PPO | Admitting: Family Medicine

## 2018-04-11 ENCOUNTER — Encounter: Payer: Self-pay | Admitting: Family Medicine

## 2018-04-11 VITALS — BP 170/90 | HR 89 | Resp 16 | Ht 73.0 in | Wt 221.0 lb

## 2018-04-11 DIAGNOSIS — R0902 Hypoxemia: Secondary | ICD-10-CM | POA: Diagnosis not present

## 2018-04-11 DIAGNOSIS — M1711 Unilateral primary osteoarthritis, right knee: Secondary | ICD-10-CM

## 2018-04-11 DIAGNOSIS — R296 Repeated falls: Secondary | ICD-10-CM

## 2018-04-11 DIAGNOSIS — I10 Essential (primary) hypertension: Secondary | ICD-10-CM | POA: Diagnosis not present

## 2018-04-11 DIAGNOSIS — R269 Unspecified abnormalities of gait and mobility: Secondary | ICD-10-CM | POA: Diagnosis not present

## 2018-04-11 DIAGNOSIS — E785 Hyperlipidemia, unspecified: Secondary | ICD-10-CM

## 2018-04-11 DIAGNOSIS — J449 Chronic obstructive pulmonary disease, unspecified: Secondary | ICD-10-CM | POA: Diagnosis not present

## 2018-04-11 MED ORDER — AMLODIPINE BESYLATE 2.5 MG PO TABS: 2.5000 mg | ORAL_TABLET | Freq: Every day | ORAL | 3 refills | Status: DC

## 2018-04-11 NOTE — Patient Instructions (Addendum)
F/U with mD in 2 months, call if you need me before  You are referred fo in home pT with dvanced for improved safety due to severe righ knee arthritis and multiple falls and knee pain and stiffness  Labs fasting lipid lipid cmp and EGFR,  Will be sent to [facility] for this week Friday, cBC, vit D and TSH

## 2018-04-14 ENCOUNTER — Telehealth: Payer: Self-pay

## 2018-04-14 DIAGNOSIS — D649 Anemia, unspecified: Secondary | ICD-10-CM | POA: Diagnosis not present

## 2018-04-14 DIAGNOSIS — E559 Vitamin D deficiency, unspecified: Secondary | ICD-10-CM

## 2018-04-14 DIAGNOSIS — I10 Essential (primary) hypertension: Secondary | ICD-10-CM | POA: Diagnosis not present

## 2018-04-14 DIAGNOSIS — E785 Hyperlipidemia, unspecified: Secondary | ICD-10-CM | POA: Diagnosis not present

## 2018-04-14 NOTE — Telephone Encounter (Signed)
Labs ordered.

## 2018-04-15 LAB — COMPLETE METABOLIC PANEL WITH GFR
AG RATIO: 1.8 (calc) (ref 1.0–2.5)
ALT: 12 U/L (ref 6–29)
AST: 16 U/L (ref 10–35)
Albumin: 4 g/dL (ref 3.6–5.1)
Alkaline phosphatase (APISO): 80 U/L (ref 33–130)
BILIRUBIN TOTAL: 0.4 mg/dL (ref 0.2–1.2)
BUN/Creatinine Ratio: 12 (calc) (ref 6–22)
BUN: 13 mg/dL (ref 7–25)
CHLORIDE: 106 mmol/L (ref 98–110)
CO2: 30 mmol/L (ref 20–32)
Calcium: 9.3 mg/dL (ref 8.6–10.4)
Creat: 1.07 mg/dL — ABNORMAL HIGH (ref 0.60–0.93)
GFR, Est African American: 58 mL/min/{1.73_m2} — ABNORMAL LOW (ref >=60)
GFR, Est Non African American: 50 mL/min/{1.73_m2} — ABNORMAL LOW (ref >=60)
Globulin: 2.2 g/dL (calc) (ref 1.9–3.7)
Glucose, Bld: 98 mg/dL (ref 65–99)
POTASSIUM: 3.8 mmol/L (ref 3.5–5.3)
Sodium: 145 mmol/L (ref 135–146)
Total Protein: 6.2 g/dL (ref 6.1–8.1)

## 2018-04-15 LAB — CBC
HCT: 33.7 % — ABNORMAL LOW (ref 35.0–45.0)
HEMOGLOBIN: 10.4 g/dL — AB (ref 11.7–15.5)
MCH: 24.8 pg — ABNORMAL LOW (ref 27.0–33.0)
MCHC: 30.9 g/dL — ABNORMAL LOW (ref 32.0–36.0)
MCV: 80.4 fL (ref 80.0–100.0)
MPV: 9.8 fL (ref 7.5–12.5)
PLATELETS: 317 10*3/uL (ref 140–400)
RBC: 4.19 10*6/uL (ref 3.80–5.10)
RDW: 15.9 % — ABNORMAL HIGH (ref 11.0–15.0)
WBC: 6.4 10*3/uL (ref 3.8–10.8)

## 2018-04-15 LAB — VITAMIN D 25 HYDROXY (VIT D DEFICIENCY, FRACTURES): Vit D, 25-Hydroxy: 31 ng/mL (ref 30–100)

## 2018-04-15 LAB — LIPID PANEL
CHOL/HDL RATIO: 2.2 (calc) (ref <5.0)
Cholesterol: 151 mg/dL (ref <200)
HDL: 69 mg/dL (ref >50)
LDL Cholesterol (Calc): 64 mg/dL (calc)
NON-HDL CHOLESTEROL (CALC): 82 mg/dL (ref <130)
TRIGLYCERIDES: 98 mg/dL (ref <150)

## 2018-04-15 LAB — TSH: TSH: 0.79 mIU/L (ref 0.40–4.50)

## 2018-04-19 DIAGNOSIS — J449 Chronic obstructive pulmonary disease, unspecified: Secondary | ICD-10-CM | POA: Diagnosis not present

## 2018-05-01 ENCOUNTER — Encounter: Payer: Self-pay | Admitting: Family Medicine

## 2018-05-01 DIAGNOSIS — M1711 Unilateral primary osteoarthritis, right knee: Secondary | ICD-10-CM | POA: Insufficient documentation

## 2018-05-01 NOTE — Assessment & Plan Note (Signed)
Needs in home physical therapy twice weekly for 6 weeks to improve safety and reduce falls from severe arthritis

## 2018-05-01 NOTE — Assessment & Plan Note (Signed)
Hyperlipidemia:Low fat diet discussed and encouraged.   Lipid Panel  Lab Results  Component Value Date   CHOL 151 04/14/2018   HDL 69 04/14/2018   LDLCALC 64 04/14/2018   TRIG 98 04/14/2018   CHOLHDL 2.2 04/14/2018   Controlled, no change in medication

## 2018-05-01 NOTE — Assessment & Plan Note (Signed)
Stable, continue current management 

## 2018-05-01 NOTE — Progress Notes (Signed)
[name]     MRN: [id number]      DOB: [birth year]   HPI [name] is here for follow up and re-evaluation of chronic medical conditions, medication management and review of any available recent lab and radiology data.  Preventive health is updated, specifically  Cancer screening and Immunization.   Questions or concerns regarding consultations or procedures which the PT has had in the interim are  addressed. The PT denies any adverse reactions to current medications since the last visit.  C/o multiple falls and near falls due to increased stiffness and pain in right knee requiting I home PT again , which she has benefited from in the past ROS Denies recent fever or chills. Denies sinus pressure, nasal congestion, ear pain or sore throat. Denies chest congestion, productive cough or  Uncontrolled wheezing. Denies chest pains, palpitations and leg swelling Denies abdominal pain, nausea, vomiting,diarrhea or constipation.   Denies dysuria, frequency, hesitancy she has chronic  incontinence. C/o chronic joint pain, swelling and limitation in mobility. Denies headaches, seizures she has lower extremity  Numbness,and  tingling. Denies uncontrolled  depression, anxiety or insomnia. Denies skin break down or rash.   PE  BP (!) 170/90   Pulse 89   Resp 16   Ht 6\' 1"  (1.854 m)   Wt 221 lb (100.2 kg)   SpO2 93%   BMI 29.16 kg/m   Patient alert and oriented and in no cardiopulmonary distress.  HEENT: No facial asymmetry, EOMI,   oropharynx pink and moist.  Neck decreased ROM no JVD, no mass.  Chest: Clear to auscultation bilaterally.decreased burt adequate air entry CVS: S1, S2 no murmurs, no S3.Regular rate.  ABD: Soft non tender.   Ext: No edema  NI:DPOEUMPNT e ROM spine, shoulders, hips and most marked in right  knee.  Skin: Intact, no ulcerations or rash noted.  Psych: Good eye contact, normal affect. Memory intact not anxious or depressed appearing.  CNS:  CN 2-12 intact, power,  normal throughout.no focal deficits noted.   Assessment & Plan  Essential hypertension Uncontrolled , did not take medication today, needs to take it  Prior to next visit DASH diet and commitment to daily physical activity for a minimum of 30 minutes discussed and encouraged, as a part of hypertension management. The importance of attaining a healthy weight is also discussed.  BP/Weight 04/11/2018 02/20/2018 01/31/2018 12/23/2017 10/10/2017 09/27/2017 04/28/4314  Systolic BP 400 867 [phone] 458  Diastolic BP 90 80 68 59 82 70 86  Wt. (Lbs) 221 217 200 - 217 - 214  BMI 29.16 28.63 26.39 28.63 28.63 28.23 28.23       Falls frequently Needs in home physical therapy twice weekly for 6 weeks to improve safety and reduce falls from severe arthritis  Abnormality of gait Severe osteoarthritis, depends on walker for safe ambulation, multiple falls in recent times refer for PT at home  Hyperlipidemia LDL goal <100 Hyperlipidemia:Low fat diet discussed and encouraged.   Lipid Panel  Lab Results  Component Value Date   CHOL 151 04/14/2018   HDL 69 04/14/2018   LDLCALC 64 04/14/2018   TRIG 98 04/14/2018   CHOLHDL 2.2 04/14/2018   Controlled, no change in medication     COPD with hypoxia (HCC) Stable, continue current management   Osteoarthritis of right knee Increased pain and  stiffness and falls noted in the past month requests in home PT to assist with this referral to be  entered

## 2018-05-01 NOTE — Assessment & Plan Note (Signed)
Severe osteoarthritis, depends on walker for safe ambulation, multiple falls in recent times refer for PT at home

## 2018-05-01 NOTE — Assessment & Plan Note (Signed)
Uncontrolled , did not take medication today, needs to take it  Prior to next visit DASH diet and commitment to daily physical activity for a minimum of 30 minutes discussed and encouraged, as a part of hypertension management. The importance of attaining a healthy weight is also discussed.  BP/Weight 04/11/2018 02/20/2018 01/31/2018 12/23/2017 10/10/2017 09/27/2017 11/25/7354  Systolic BP 701 410 [phone] 797  Diastolic BP 90 80 68 59 82 70 86  Wt. (Lbs) 221 217 200 - 217 - 214  BMI 29.16 28.63 26.39 28.63 28.63 28.23 28.23

## 2018-05-01 NOTE — Assessment & Plan Note (Addendum)
Increased pain and  stiffness and falls noted in the past month requests in home PT to assist with this referral to be  entered

## 2018-05-12 ENCOUNTER — Other Ambulatory Visit: Payer: Self-pay | Admitting: Family Medicine

## 2018-05-12 DIAGNOSIS — J449 Chronic obstructive pulmonary disease, unspecified: Secondary | ICD-10-CM | POA: Diagnosis not present

## 2018-05-15 DIAGNOSIS — Z9981 Dependence on supplemental oxygen: Secondary | ICD-10-CM | POA: Diagnosis not present

## 2018-05-15 DIAGNOSIS — Z9181 History of falling: Secondary | ICD-10-CM | POA: Diagnosis not present

## 2018-05-15 DIAGNOSIS — R2689 Other abnormalities of gait and mobility: Secondary | ICD-10-CM | POA: Diagnosis not present

## 2018-05-15 DIAGNOSIS — J449 Chronic obstructive pulmonary disease, unspecified: Secondary | ICD-10-CM | POA: Diagnosis not present

## 2018-05-15 DIAGNOSIS — M1711 Unilateral primary osteoarthritis, right knee: Secondary | ICD-10-CM | POA: Diagnosis not present

## 2018-05-15 DIAGNOSIS — E785 Hyperlipidemia, unspecified: Secondary | ICD-10-CM | POA: Diagnosis not present

## 2018-05-15 DIAGNOSIS — I10 Essential (primary) hypertension: Secondary | ICD-10-CM | POA: Diagnosis not present

## 2018-05-15 DIAGNOSIS — M25562 Pain in left knee: Secondary | ICD-10-CM | POA: Diagnosis not present

## 2018-05-15 DIAGNOSIS — R296 Repeated falls: Secondary | ICD-10-CM | POA: Diagnosis not present

## 2018-05-19 DIAGNOSIS — J449 Chronic obstructive pulmonary disease, unspecified: Secondary | ICD-10-CM | POA: Diagnosis not present

## 2018-05-23 ENCOUNTER — Other Ambulatory Visit: Payer: Self-pay

## 2018-05-23 ENCOUNTER — Other Ambulatory Visit: Payer: Self-pay | Admitting: Family Medicine

## 2018-05-23 ENCOUNTER — Telehealth: Payer: Self-pay | Admitting: Family Medicine

## 2018-05-23 DIAGNOSIS — Z9181 History of falling: Secondary | ICD-10-CM | POA: Diagnosis not present

## 2018-05-23 DIAGNOSIS — R2689 Other abnormalities of gait and mobility: Secondary | ICD-10-CM | POA: Diagnosis not present

## 2018-05-23 DIAGNOSIS — J449 Chronic obstructive pulmonary disease, unspecified: Secondary | ICD-10-CM | POA: Diagnosis not present

## 2018-05-23 DIAGNOSIS — M1711 Unilateral primary osteoarthritis, right knee: Secondary | ICD-10-CM | POA: Diagnosis not present

## 2018-05-23 DIAGNOSIS — E785 Hyperlipidemia, unspecified: Secondary | ICD-10-CM | POA: Diagnosis not present

## 2018-05-23 DIAGNOSIS — M25562 Pain in left knee: Secondary | ICD-10-CM | POA: Diagnosis not present

## 2018-05-23 DIAGNOSIS — Z9981 Dependence on supplemental oxygen: Secondary | ICD-10-CM | POA: Diagnosis not present

## 2018-05-23 DIAGNOSIS — I10 Essential (primary) hypertension: Secondary | ICD-10-CM | POA: Diagnosis not present

## 2018-05-23 DIAGNOSIS — R296 Repeated falls: Secondary | ICD-10-CM | POA: Diagnosis not present

## 2018-05-23 DIAGNOSIS — Z1231 Encounter for screening mammogram for malignant neoplasm of breast: Secondary | ICD-10-CM

## 2018-05-23 MED ORDER — ALBUTEROL SULFATE 108 (90 BASE) MCG/ACT IN AEPB: 2.0000 | INHALATION_SPRAY | Freq: Three times a day (TID) | RESPIRATORY_TRACT | 1 refills | Status: DC | PRN

## 2018-05-23 NOTE — Telephone Encounter (Signed)
Inhaler sent in.

## 2018-05-23 NOTE — Telephone Encounter (Signed)
Patients daughter called in that Proair inhaler is out, requesting refill. [facility] is her pharmacy.  336/ [phone]

## 2018-05-25 DIAGNOSIS — Z79899 Other long term (current) drug therapy: Secondary | ICD-10-CM | POA: Diagnosis not present

## 2018-05-25 DIAGNOSIS — M25562 Pain in left knee: Secondary | ICD-10-CM | POA: Diagnosis not present

## 2018-05-25 DIAGNOSIS — R296 Repeated falls: Secondary | ICD-10-CM | POA: Diagnosis not present

## 2018-05-25 DIAGNOSIS — I10 Essential (primary) hypertension: Secondary | ICD-10-CM | POA: Diagnosis not present

## 2018-05-25 DIAGNOSIS — M1711 Unilateral primary osteoarthritis, right knee: Secondary | ICD-10-CM | POA: Diagnosis not present

## 2018-05-25 DIAGNOSIS — E785 Hyperlipidemia, unspecified: Secondary | ICD-10-CM | POA: Diagnosis not present

## 2018-05-25 DIAGNOSIS — Z7982 Long term (current) use of aspirin: Secondary | ICD-10-CM | POA: Diagnosis not present

## 2018-05-25 DIAGNOSIS — J449 Chronic obstructive pulmonary disease, unspecified: Secondary | ICD-10-CM | POA: Diagnosis not present

## 2018-05-25 DIAGNOSIS — R2689 Other abnormalities of gait and mobility: Secondary | ICD-10-CM | POA: Diagnosis not present

## 2018-05-25 DIAGNOSIS — Z9181 History of falling: Secondary | ICD-10-CM | POA: Diagnosis not present

## 2018-05-25 DIAGNOSIS — Z9981 Dependence on supplemental oxygen: Secondary | ICD-10-CM | POA: Diagnosis not present

## 2018-05-30 DIAGNOSIS — R2689 Other abnormalities of gait and mobility: Secondary | ICD-10-CM | POA: Diagnosis not present

## 2018-05-30 DIAGNOSIS — R296 Repeated falls: Secondary | ICD-10-CM | POA: Diagnosis not present

## 2018-05-30 DIAGNOSIS — M25562 Pain in left knee: Secondary | ICD-10-CM | POA: Diagnosis not present

## 2018-05-30 DIAGNOSIS — M1711 Unilateral primary osteoarthritis, right knee: Secondary | ICD-10-CM | POA: Diagnosis not present

## 2018-05-30 DIAGNOSIS — J449 Chronic obstructive pulmonary disease, unspecified: Secondary | ICD-10-CM | POA: Diagnosis not present

## 2018-05-30 DIAGNOSIS — Z9981 Dependence on supplemental oxygen: Secondary | ICD-10-CM | POA: Diagnosis not present

## 2018-05-30 DIAGNOSIS — I10 Essential (primary) hypertension: Secondary | ICD-10-CM | POA: Diagnosis not present

## 2018-05-30 DIAGNOSIS — E785 Hyperlipidemia, unspecified: Secondary | ICD-10-CM | POA: Diagnosis not present

## 2018-05-30 DIAGNOSIS — Z9181 History of falling: Secondary | ICD-10-CM | POA: Diagnosis not present

## 2018-06-01 DIAGNOSIS — Z9981 Dependence on supplemental oxygen: Secondary | ICD-10-CM | POA: Diagnosis not present

## 2018-06-01 DIAGNOSIS — M25562 Pain in left knee: Secondary | ICD-10-CM | POA: Diagnosis not present

## 2018-06-01 DIAGNOSIS — M1711 Unilateral primary osteoarthritis, right knee: Secondary | ICD-10-CM | POA: Diagnosis not present

## 2018-06-01 DIAGNOSIS — J449 Chronic obstructive pulmonary disease, unspecified: Secondary | ICD-10-CM | POA: Diagnosis not present

## 2018-06-01 DIAGNOSIS — R296 Repeated falls: Secondary | ICD-10-CM | POA: Diagnosis not present

## 2018-06-01 DIAGNOSIS — R2689 Other abnormalities of gait and mobility: Secondary | ICD-10-CM | POA: Diagnosis not present

## 2018-06-01 DIAGNOSIS — E785 Hyperlipidemia, unspecified: Secondary | ICD-10-CM | POA: Diagnosis not present

## 2018-06-01 DIAGNOSIS — Z9181 History of falling: Secondary | ICD-10-CM | POA: Diagnosis not present

## 2018-06-01 DIAGNOSIS — I10 Essential (primary) hypertension: Secondary | ICD-10-CM | POA: Diagnosis not present

## 2018-06-05 DIAGNOSIS — R296 Repeated falls: Secondary | ICD-10-CM | POA: Diagnosis not present

## 2018-06-05 DIAGNOSIS — I10 Essential (primary) hypertension: Secondary | ICD-10-CM | POA: Diagnosis not present

## 2018-06-05 DIAGNOSIS — M25562 Pain in left knee: Secondary | ICD-10-CM | POA: Diagnosis not present

## 2018-06-05 DIAGNOSIS — Z9981 Dependence on supplemental oxygen: Secondary | ICD-10-CM | POA: Diagnosis not present

## 2018-06-05 DIAGNOSIS — M1711 Unilateral primary osteoarthritis, right knee: Secondary | ICD-10-CM | POA: Diagnosis not present

## 2018-06-05 DIAGNOSIS — E785 Hyperlipidemia, unspecified: Secondary | ICD-10-CM | POA: Diagnosis not present

## 2018-06-05 DIAGNOSIS — J449 Chronic obstructive pulmonary disease, unspecified: Secondary | ICD-10-CM | POA: Diagnosis not present

## 2018-06-05 DIAGNOSIS — Z9181 History of falling: Secondary | ICD-10-CM | POA: Diagnosis not present

## 2018-06-05 DIAGNOSIS — R2689 Other abnormalities of gait and mobility: Secondary | ICD-10-CM | POA: Diagnosis not present

## 2018-06-08 DIAGNOSIS — Z9181 History of falling: Secondary | ICD-10-CM | POA: Diagnosis not present

## 2018-06-08 DIAGNOSIS — R296 Repeated falls: Secondary | ICD-10-CM | POA: Diagnosis not present

## 2018-06-08 DIAGNOSIS — Z9981 Dependence on supplemental oxygen: Secondary | ICD-10-CM | POA: Diagnosis not present

## 2018-06-08 DIAGNOSIS — M25562 Pain in left knee: Secondary | ICD-10-CM | POA: Diagnosis not present

## 2018-06-08 DIAGNOSIS — I10 Essential (primary) hypertension: Secondary | ICD-10-CM | POA: Diagnosis not present

## 2018-06-08 DIAGNOSIS — M1711 Unilateral primary osteoarthritis, right knee: Secondary | ICD-10-CM | POA: Diagnosis not present

## 2018-06-08 DIAGNOSIS — E785 Hyperlipidemia, unspecified: Secondary | ICD-10-CM | POA: Diagnosis not present

## 2018-06-08 DIAGNOSIS — J449 Chronic obstructive pulmonary disease, unspecified: Secondary | ICD-10-CM | POA: Diagnosis not present

## 2018-06-08 DIAGNOSIS — R2689 Other abnormalities of gait and mobility: Secondary | ICD-10-CM | POA: Diagnosis not present

## 2018-06-11 DIAGNOSIS — J449 Chronic obstructive pulmonary disease, unspecified: Secondary | ICD-10-CM | POA: Diagnosis not present

## 2018-06-12 ENCOUNTER — Telehealth: Payer: Self-pay | Admitting: Family Medicine

## 2018-06-12 DIAGNOSIS — J449 Chronic obstructive pulmonary disease, unspecified: Secondary | ICD-10-CM | POA: Diagnosis not present

## 2018-06-12 DIAGNOSIS — R296 Repeated falls: Secondary | ICD-10-CM | POA: Diagnosis not present

## 2018-06-12 DIAGNOSIS — Z9181 History of falling: Secondary | ICD-10-CM | POA: Diagnosis not present

## 2018-06-12 DIAGNOSIS — R2689 Other abnormalities of gait and mobility: Secondary | ICD-10-CM | POA: Diagnosis not present

## 2018-06-12 DIAGNOSIS — M1711 Unilateral primary osteoarthritis, right knee: Secondary | ICD-10-CM | POA: Diagnosis not present

## 2018-06-12 DIAGNOSIS — I10 Essential (primary) hypertension: Secondary | ICD-10-CM | POA: Diagnosis not present

## 2018-06-12 DIAGNOSIS — M25562 Pain in left knee: Secondary | ICD-10-CM | POA: Diagnosis not present

## 2018-06-12 DIAGNOSIS — E785 Hyperlipidemia, unspecified: Secondary | ICD-10-CM | POA: Diagnosis not present

## 2018-06-12 DIAGNOSIS — Z9981 Dependence on supplemental oxygen: Secondary | ICD-10-CM | POA: Diagnosis not present

## 2018-06-12 NOTE — Telephone Encounter (Signed)
Ben from [place] called and said that [name] had a fall this am.  He said she continues to be a high fall risk.  If you need to contact him his # is [phone]

## 2018-06-12 NOTE — Telephone Encounter (Signed)
fyi

## 2018-06-14 DIAGNOSIS — R296 Repeated falls: Secondary | ICD-10-CM | POA: Diagnosis not present

## 2018-06-14 DIAGNOSIS — R2689 Other abnormalities of gait and mobility: Secondary | ICD-10-CM | POA: Diagnosis not present

## 2018-06-14 DIAGNOSIS — M25562 Pain in left knee: Secondary | ICD-10-CM | POA: Diagnosis not present

## 2018-06-14 DIAGNOSIS — Z9981 Dependence on supplemental oxygen: Secondary | ICD-10-CM | POA: Diagnosis not present

## 2018-06-14 DIAGNOSIS — I10 Essential (primary) hypertension: Secondary | ICD-10-CM | POA: Diagnosis not present

## 2018-06-14 DIAGNOSIS — Z9181 History of falling: Secondary | ICD-10-CM | POA: Diagnosis not present

## 2018-06-14 DIAGNOSIS — J449 Chronic obstructive pulmonary disease, unspecified: Secondary | ICD-10-CM | POA: Diagnosis not present

## 2018-06-14 DIAGNOSIS — E785 Hyperlipidemia, unspecified: Secondary | ICD-10-CM | POA: Diagnosis not present

## 2018-06-14 DIAGNOSIS — M1711 Unilateral primary osteoarthritis, right knee: Secondary | ICD-10-CM | POA: Diagnosis not present

## 2018-06-19 DIAGNOSIS — J449 Chronic obstructive pulmonary disease, unspecified: Secondary | ICD-10-CM | POA: Diagnosis not present

## 2018-06-27 ENCOUNTER — Ambulatory Visit: Payer: Medicare PPO | Admitting: Family Medicine

## 2018-06-28 ENCOUNTER — Encounter (HOSPITAL_COMMUNITY): Payer: Self-pay

## 2018-06-28 ENCOUNTER — Ambulatory Visit (HOSPITAL_COMMUNITY): Payer: Medicare PPO

## 2018-06-28 ENCOUNTER — Ambulatory Visit (HOSPITAL_COMMUNITY)
Admission: RE | Admit: 2018-06-28 | Discharge: 2018-06-28 | Disposition: A | Discharge status: Home / Self Care | Payer: Medicare PPO | Source: Ambulatory Visit | Attending: Family Medicine | Admitting: Family Medicine

## 2018-06-28 DIAGNOSIS — Z1231 Encounter for screening mammogram for malignant neoplasm of breast: Secondary | ICD-10-CM | POA: Diagnosis not present

## 2018-06-29 ENCOUNTER — Other Ambulatory Visit: Payer: Self-pay | Admitting: Family Medicine

## 2018-07-03 ENCOUNTER — Encounter: Payer: Self-pay | Admitting: Family Medicine

## 2018-07-12 ENCOUNTER — Emergency Department (HOSPITAL_COMMUNITY): Payer: Medicare PPO

## 2018-07-12 ENCOUNTER — Encounter (HOSPITAL_COMMUNITY): Payer: Self-pay

## 2018-07-12 ENCOUNTER — Emergency Department (HOSPITAL_COMMUNITY)
Admission: EM | Admit: 2018-07-12 | Discharge: 2018-07-12 | Disposition: A | Discharge status: Home / Self Care | Payer: Medicare PPO | Attending: Emergency Medicine | Admitting: Emergency Medicine

## 2018-07-12 DIAGNOSIS — Z5321 Procedure and treatment not carried out due to patient leaving prior to being seen by health care provider: Secondary | ICD-10-CM | POA: Diagnosis not present

## 2018-07-12 DIAGNOSIS — M79661 Pain in right lower leg: Secondary | ICD-10-CM | POA: Insufficient documentation

## 2018-07-12 DIAGNOSIS — J449 Chronic obstructive pulmonary disease, unspecified: Secondary | ICD-10-CM | POA: Diagnosis not present

## 2018-07-12 DIAGNOSIS — S82831A Other fracture of upper and lower end of right fibula, initial encounter for closed fracture: Secondary | ICD-10-CM | POA: Diagnosis not present

## 2018-07-12 NOTE — ED Triage Notes (Signed)
Pt fell yesterday and pt reports right leg pain esp from knee down leg

## 2018-07-12 NOTE — ED Notes (Signed)
On chronic O2 

## 2018-07-13 ENCOUNTER — Encounter (HOSPITAL_COMMUNITY): Payer: Self-pay | Admitting: Emergency Medicine

## 2018-07-13 ENCOUNTER — Emergency Department (HOSPITAL_COMMUNITY)
Admission: EM | Admit: 2018-07-13 | Discharge: 2018-07-13 | Disposition: A | Discharge status: Home / Self Care | Payer: Medicare PPO | Attending: Emergency Medicine | Admitting: Emergency Medicine

## 2018-07-13 ENCOUNTER — Other Ambulatory Visit: Payer: Self-pay

## 2018-07-13 DIAGNOSIS — Y9389 Activity, other specified: Secondary | ICD-10-CM | POA: Diagnosis not present

## 2018-07-13 DIAGNOSIS — W050XXA Fall from non-moving wheelchair, initial encounter: Secondary | ICD-10-CM | POA: Diagnosis not present

## 2018-07-13 DIAGNOSIS — Z7982 Long term (current) use of aspirin: Secondary | ICD-10-CM | POA: Insufficient documentation

## 2018-07-13 DIAGNOSIS — Z87891 Personal history of nicotine dependence: Secondary | ICD-10-CM | POA: Diagnosis not present

## 2018-07-13 DIAGNOSIS — F419 Anxiety disorder, unspecified: Secondary | ICD-10-CM | POA: Diagnosis not present

## 2018-07-13 DIAGNOSIS — F329 Major depressive disorder, single episode, unspecified: Secondary | ICD-10-CM | POA: Diagnosis not present

## 2018-07-13 DIAGNOSIS — Z79899 Other long term (current) drug therapy: Secondary | ICD-10-CM | POA: Insufficient documentation

## 2018-07-13 DIAGNOSIS — Y998 Other external cause status: Secondary | ICD-10-CM | POA: Diagnosis not present

## 2018-07-13 DIAGNOSIS — S8991XA Unspecified injury of right lower leg, initial encounter: Secondary | ICD-10-CM | POA: Diagnosis present

## 2018-07-13 DIAGNOSIS — J449 Chronic obstructive pulmonary disease, unspecified: Secondary | ICD-10-CM | POA: Insufficient documentation

## 2018-07-13 DIAGNOSIS — Y92009 Unspecified place in unspecified non-institutional (private) residence as the place of occurrence of the external cause: Secondary | ICD-10-CM | POA: Diagnosis not present

## 2018-07-13 DIAGNOSIS — S82831A Other fracture of upper and lower end of right fibula, initial encounter for closed fracture: Secondary | ICD-10-CM | POA: Insufficient documentation

## 2018-07-13 DIAGNOSIS — F0390 Unspecified dementia without behavioral disturbance: Secondary | ICD-10-CM | POA: Insufficient documentation

## 2018-07-13 DIAGNOSIS — Z66 Do not resuscitate: Secondary | ICD-10-CM | POA: Diagnosis not present

## 2018-07-13 DIAGNOSIS — I10 Essential (primary) hypertension: Secondary | ICD-10-CM | POA: Insufficient documentation

## 2018-07-13 HISTORY — DX: Unspecified dementia, unspecified severity, without behavioral disturbance, psychotic disturbance, mood disturbance, and anxiety: F03.90

## 2018-07-13 NOTE — Discharge Instructions (Addendum)
Wear the ace wrap to protect your knee and to remind you to minimize weight bearing.  Have your walker handy for your transfers from your wheelchair as discussed.  Continue using ice and elevation and you may increase your tylenol dosing to 3 times daily if needed for pain relief.

## 2018-07-13 NOTE — ED Triage Notes (Signed)
Here last night  But left ama due to long wait. Pt fell  Monday and c/o pain to right knee and knot to right side abd. A/o to most. Usually in w/c. Can use walker with assistance per daughter with pt. Right knee swelling wnl.

## 2018-07-13 NOTE — ED Provider Notes (Signed)
[facility] EMERGENCY DEPARTMENT Provider Note   CSN: [id number] Arrival date & time: 07/13/18  4431     History   Chief Complaint Chief Complaint  Patient presents with  . Fall    HPI ISSABELLE Luna is a 77 y.o. female.  The history is provided by the patient and a caregiver (daughter).  Fall  This is a recurrent problem. Episode onset: 3 days ago. pt is wheelchair bound, had a fall with an assisted transfer from her wheelchair to the bedside commode 3 days ago. Pertinent negatives include no chest pain, no abdominal pain, no headaches and no shortness of breath. Associated symptoms comments: Denies head, neck or back pain, also denies hip pain. The symptoms are aggravated by standing. The symptoms are relieved by rest, acetaminophen and ice. She has tried a cold compress and acetaminophen for the symptoms. The treatment provided moderate relief.    Pt was seen here last night and had xrays completed, but left prior to being seen.   Past Medical History:  Diagnosis Date  . Abnormality of gait 06/06/2014  . Allergic rhinitis   . Anemia   . Ankle fracture, right   . Anxiety   . Arm fracture, left   . Arthritis    abnormal gait   . COPD (chronic obstructive pulmonary disease) (HCC)    chronic CO2 retention, decreased DLCO   . Cystic acne    adult   . 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 Degenerative disc disease, lumbar    L5 nerve impingement   . Dementia   . Depression   . DNR (do not resuscitate)   . GERD (gastroesophageal reflux disease)   . History of colon surgery    right hemicolectomy for high-grade adenomas in right colon 2013  . Hyperglycemia   . Hypertension   . Low back pain   . Mediastinal lymphadenopathy 1/9   resolving   . Memory difficulty 09/20/2014   mild dementia  . Onychomycosis   . OSA on CPAP   . Sleep apnea    stop bang score 6    Patient Active Problem List   Diagnosis Date Noted  . Osteoarthritis of right knee  05/01/2018  . Heme positive stool 10/10/2017  . COPD with hypoxia ([place]) 07/02/2017  . Demand ischemia ([place]) 01/30/2017  . Dementia 01/28/2017  . Bimalleolar ankle fracture 01/13/2016  . Memory difficulty 09/20/2014  . Abnormality of gait 06/06/2014  . Cervical neck pain with evidence of disc disease 05/20/2014  . Chronic right shoulder pain 10/30/2013  . Multiple adenomatous polyps 02/15/2013  . Spinal stenosis 09/29/2011  . Falls frequently 09/01/2011  . ALLERGIC RHINITIS, SEASONAL 04/10/2010  . Hyperlipidemia LDL goal <100 01/25/2007  . Normocytic anemia 10/05/2006  . Obstructive sleep apnea 10/05/2006  . Essential hypertension 10/05/2006  . GERD 10/05/2006  . Generalized osteoarthritis of multiple sites 10/05/2006    Past Surgical History:  Procedure Laterality Date  . ABDOMINAL HYSTERECTOMY  1976   secondary to bleeding   . APPENDECTOMY    . COLON RESECTION  08/18/2012   Procedure: HAND ASSISTED LAPAROSCOPIC COLON RESECTION;  Surgeon: Jamesetta So, MD;  Location: [facility] ORS;  Service: General;  Laterality: N/A;  . COLONOSCOPY  07/20/2012   Dr. Gala Romney: multiple colonic polyps with large polyps on right side, s/p saline-assisted debulking piecemeal polypectomy and ablation. Not all removed. Path with tubulovillous adenomas, high grade dysplasia.   . COLONOSCOPY N/A 03/09/2013   VQM:GQQPYPP polyps-tubular  adenomas. S/p right hemicolectomy. next tcs 02/2018  . epidural steroids    . ESOPHAGOGASTRODUODENOSCOPY (EGD) WITH ESOPHAGEAL DILATION N/A 02/06/2014   Procedure: ESOPHAGOGASTRODUODENOSCOPY (EGD) WITH ESOPHAGEAL DILATION;  Surgeon: Daneil Dolin, MD;  Location: [facility] ENDO SUITE;  Service: Endoscopy;  Laterality: N/A;  9:30  . OOPHORECTOMY  1976  . ORIF ANKLE FRACTURE  01/18/2012   Procedure: OPEN REDUCTION INTERNAL FIXATION (ORIF) ANKLE FRACTURE;  Surgeon: Arther Abbott, MD;  Location: [facility] ORS;  Service: Orthopedics;  Laterality: Left;  . ORIF ANKLE FRACTURE Right 01/13/2016    Procedure: OPEN REDUCTION INTERNAL FIXATION (ORIF) ANKLE FRACTURE;  Surgeon: Carole Civil, MD;  Location: [facility] ORS;  Service: Orthopedics;  Laterality: Right;     OB History    Gravida  5   Para  4   Term  4   Preterm      AB  1   Living        SAB  1   TAB      Ectopic      Multiple      Live Births               Home Medications    Prior to Admission medications   Medication Sig Start Date End Date Taking? Authorizing Provider  acetaminophen (TYLENOL) 650 MG CR tablet Take 650 mg by mouth every 8 (eight) hours as needed for pain.    [provider]  Albuterol Sulfate (PROAIR RESPICLICK) 195 (90 Base) MCG/ACT AEPB Inhale 2 puffs into the lungs every 8 (eight) hours as needed. Patient taking differently: Inhale 2 puffs into the lungs every 8 (eight) hours as needed (for shortness of breath).  05/23/18   [name], MD  alendronate (FOSAMAX) 70 MG tablet TAKE 1 TABLET ONE TIME A WEEK. TAKE WITH A FULL GLASS OF WATER ON AN EMPTY STOMACH. Patient taking differently: Take 70 mg by mouth every Sunday.  06/30/18   [name], MD  amLODipine (NORVASC) 2.5 MG tablet Take 1 tablet (2.5 mg total) by mouth daily. 04/11/18   [name], MD  arformoterol (BROVANA) 15 MCG/2ML NEBU Take 2 mLs (15 mcg total) by nebulization 2 (two) times daily. 06/17/17   [name], MD  aspirin EC 81 MG tablet Take 81 mg by mouth daily.    [provider]  atorvastatin (LIPITOR) 10 MG tablet TAKE 1 TABLET EVERY DAY Patient taking differently: Take 10 mg by mouth every evening.  05/12/18   [name], MD  calcium-vitamin D (OSCAL WITH D) 500-200 MG-UNIT per tablet Take 1 tablet by mouth daily.     [provider]  donepezil (ARICEPT) 10 MG tablet TAKE 1 TABLET AT BEDTIME  Patient taking differently: Take 10 mg by mouth at bedtime.  02/22/18   [name], MD  ENSURE (ENSURE) Take 1 Can by mouth daily as needed. Take one by  mouth with meals     [provider]  FLUoxetine (PROZAC) 40 MG capsule TAKE 1 CAPSULE EVERY DAY Patient taking differently: Take 40 mg by mouth daily.  12/22/17   [name], MD  loratadine (CLARITIN) 10 MG tablet Take 10 mg by mouth daily as needed for allergies.     [provider]  Multiple Vitamins-Minerals (CEROVITE SENIOR) TABS Take 1 tablet by mouth daily.     [provider]  omeprazole (PRILOSEC) 40 MG capsule TAKE 1 CAPSULE EVERY DAY Patient taking differently: Take 40 mg by mouth  daily.  12/22/17   [name], MD  OXYGEN Inhale 2 L into the lungs continuous.    [provider]  verapamil (CALAN-SR) 240 MG CR tablet TAKE 1 TABLET AT BEDTIME Patient taking differently: Take 240 mg by mouth at bedtime.  05/12/18   [name], MD    Family History Family History  Problem Relation Age of Onset  . Stomach cancer Father   . Cancer Father   . Cancer Mother        brain   . Breast cancer Mother   . Arthritis Unknown     Social History Social History   Tobacco Use  . Smoking status: Former Smoker    Packs/day: 1.50    Years: 40.00    Pack years: 60.00    Types: Cigarettes    Last attempt to quit: 06/21/2006    Years since quitting: 12.0  . Smokeless tobacco: Never Used  Substance Use Topics  . Alcohol use: No  . Drug use: No     Allergies   Fenofibrate; Pravastatin sodium; Sulfonamide derivatives; and Ciprofloxacin   Review of Systems Review of Systems  Constitutional: Negative for fever.  Respiratory: Negative for shortness of breath.   Cardiovascular: Negative for chest pain.  Gastrointestinal: Negative for abdominal pain.  Musculoskeletal: Positive for arthralgias and joint swelling. Negative for myalgias.  Skin: Positive for color change.  Neurological: Negative for weakness, numbness and headaches.     Physical Exam Updated Vital Signs BP 128/79 (BP Location: Left Arm)   Pulse 88   Temp 98.1 F  (36.7 C) (Oral)   Resp 18   SpO2 (!) 88%   Physical Exam  Constitutional: She appears well-developed and well-nourished.  HENT:  Head: Atraumatic.  Neck: Normal range of motion.  Cardiovascular:  Pulses:      Dorsalis pedis pulses are 2+ on the right side, and 2+ on the left side.  Pulses equal bilaterally  Musculoskeletal: She exhibits tenderness. She exhibits no deformity.       Right knee: She exhibits swelling, ecchymosis and bony tenderness. She exhibits no deformity, no LCL laxity and no MCL laxity. Tenderness found. Lateral joint line tenderness noted. No patellar tendon tenderness noted.  ttp right knee lateral joint space extending into upper fibula.  Bruising most noted medial upper anterior tibia.  No quadriceps or patellar tendon pain.   No hip pain, internal/external rotation of legs without hip pain or apprehension.   Neurological: She is alert. She has normal strength. She displays normal reflexes. No sensory deficit.  Skin: Skin is warm and dry.  Psychiatric: She has a normal mood and affect.     ED Treatments / Results  Labs (all labs ordered are listed, but only abnormal results are displayed) Labs Reviewed - No data to display  EKG None  Radiology Dg Knee Complete 4 Views Right  Addendum Date: 07/13/2018   ADDENDUM REPORT: 07/13/2018 09:59 ADDENDUM: Reviewed exam at request of ER provider. Patient is tender at the proximal RIGHT fibula. Images demonstrate a nondisplaced fracture at the RIGHT fibular neck. No additional fracture or dislocation identified. Remaining findings as previously dictated. Electronically Signed   By: Lavonia Dana M.D.   On: 07/13/2018 09:59   Result Date: 07/13/2018 CLINICAL DATA:  [place] yesterday. EXAM: RIGHT KNEE - COMPLETE 4+ VIEW COMPARISON:  RIGHT knee radiograph January 31, 2017 FINDINGS: No acute fracture deformity or dislocation. Small medial femoral epicondylar exostosis. Moderate tricompartmental joint space narrowing with  periarticular sclerosis  and marginal spurring. Similar faint meniscal calcifications. No destructive bony lesions. Soft tissue planes are nonacute. Small potential loose body popliteal fossa. IMPRESSION: 1. No acute fracture deformity or dislocation. 2. Stable moderate tricompartmental osteoarthrosis. Electronically Signed: By: Elon Alas M.D. On: 07/12/2018 19:32    Procedures Procedures (including critical care time)  Medications Ordered in ED Medications - No data to display   Initial Impression / Assessment and Plan / ED Course  I have reviewed the triage vital signs and the nursing notes.  Pertinent labs & imaging results that were available during my care of the patient were reviewed by me and considered in my medical decision making (see chart for details).     Pt with nondisplaced proximal fibular fx.  She is already wheelchair bound and would not benefit from splinting.  Advised continued ice, elevation, ace wrap provided.  She has a walker and advised to keep within in reach for safer transfers to bed and bedside commode.  Plan f/u with ortho. Dg to call for appt within one week.  Final Clinical Impressions(s) / ED Diagnoses   Final diagnoses:  Closed fracture of proximal end of right fibula, unspecified fracture morphology, initial encounter    ED Discharge Orders    None       Landis Martins 07/13/18 1009    Milton Ferguson, MD 07/13/18 [phone]

## 2018-07-19 ENCOUNTER — Encounter: Payer: Self-pay | Admitting: Orthopedic Surgery

## 2018-07-19 ENCOUNTER — Ambulatory Visit: Payer: Medicare PPO | Admitting: Orthopedic Surgery

## 2018-07-19 VITALS — BP 104/61 | HR 93 | Ht 72.0 in | Wt 220.0 lb

## 2018-07-19 DIAGNOSIS — S82831A Other fracture of upper and lower end of right fibula, initial encounter for closed fracture: Secondary | ICD-10-CM | POA: Diagnosis not present

## 2018-07-19 NOTE — Progress Notes (Signed)
[name]  07/19/2018  HISTORY SECTION :  Chief Complaint  Patient presents with  . Knee Injury    ER follow up on right fibula fracture, DOI 07/13/18.   HPI The patient presents for evaluation of pain swelling right knee.  The family noticed that the knee was swollen.  The patient stays at home and is basically bed to chair with family support.  She went to the emergency room on the 29th x-rays showed a proximal fibular fracture  Location right knee pain and swelling Duration 6 days Quality dull Severity mild  associated with the effusion   Review of Systems  Respiratory: Positive for shortness of breath.   Neurological: Positive for weakness.    Past Medical History:  Diagnosis Date  . Abnormality of gait 06/06/2014  . Allergic rhinitis   . Anemia   . Ankle fracture, right   . Anxiety   . Arm fracture, left   . Arthritis    abnormal gait   . COPD (chronic obstructive pulmonary disease) (HCC)    chronic CO2 retention, decreased DLCO   . Cystic acne    adult   . 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 Degenerative disc disease, lumbar    L5 nerve impingement   . Dementia   . Depression   . DNR (do not resuscitate)   . GERD (gastroesophageal reflux disease)   . History of colon surgery    right hemicolectomy for high-grade adenomas in right colon 2013  . Hyperglycemia   . Hypertension   . Low back pain   . Mediastinal lymphadenopathy 1/9   resolving   . Memory difficulty 09/20/2014   mild dementia  . Onychomycosis   . OSA on CPAP   . Sleep apnea    stop bang score 6    Allergies  Allergen Reactions  . Fenofibrate Nausea And Vomiting  . Pravastatin Sodium Nausea And Vomiting  . Sulfonamide Derivatives Other (See Comments)    unknown  . Ciprofloxacin Rash     Current Outpatient Medications:  .  acetaminophen (TYLENOL) 650 MG CR tablet, Take 650 mg by mouth every 8 (eight) hours as needed for pain., Disp: , Rfl:  .  Albuterol  Sulfate (PROAIR RESPICLICK) 626 (90 Base) MCG/ACT AEPB, Inhale 2 puffs into the lungs every 8 (eight) hours as needed. (Patient taking differently: Inhale 2 puffs into the lungs every 8 (eight) hours as needed (for shortness of breath). ), Disp: 3 each, Rfl: 1 .  alendronate (FOSAMAX) 70 MG tablet, TAKE 1 TABLET ONE TIME A WEEK. TAKE WITH A FULL GLASS OF WATER ON AN EMPTY STOMACH. (Patient taking differently: Take 70 mg by mouth every Sunday. ), Disp: 12 tablet, Rfl: 1 .  amLODipine (NORVASC) 2.5 MG tablet, Take 1 tablet (2.5 mg total) by mouth daily., Disp: 30 tablet, Rfl: 3 .  arformoterol (BROVANA) 15 MCG/2ML NEBU, Take 2 mLs (15 mcg total) by nebulization 2 (two) times daily., Disp: 360 mL, Rfl: 1 .  aspirin EC 81 MG tablet, Take 81 mg by mouth daily., Disp: , Rfl:  .  atorvastatin (LIPITOR) 10 MG tablet, TAKE 1 TABLET EVERY DAY (Patient taking differently: Take 10 mg by mouth every evening. ), Disp: 90 tablet, Rfl: 0 .  calcium-vitamin D (OSCAL WITH D) 500-200 MG-UNIT per tablet, Take 1 tablet by mouth daily. , Disp: , Rfl:  .  donepezil (ARICEPT) 10 MG tablet, TAKE 1 TABLET AT BEDTIME  (Patient taking differently:  Take 10 mg by mouth at bedtime. ), Disp: 90 tablet, Rfl: 1 .  ENSURE (ENSURE), Take 1 Can by mouth daily as needed. Take one by mouth with meals , Disp: , Rfl:  .  FLUoxetine (PROZAC) 40 MG capsule, TAKE 1 CAPSULE EVERY DAY (Patient taking differently: Take 40 mg by mouth daily. ), Disp: 90 capsule, Rfl: 1 .  loratadine (CLARITIN) 10 MG tablet, Take 10 mg by mouth daily as needed for allergies. , Disp: , Rfl:  .  Multiple Vitamins-Minerals (CEROVITE SENIOR) TABS, Take 1 tablet by mouth daily. , Disp: , Rfl:  .  omeprazole (PRILOSEC) 40 MG capsule, TAKE 1 CAPSULE EVERY DAY (Patient taking differently: Take 40 mg by mouth daily. ), Disp: 90 capsule, Rfl: 1 .  OXYGEN, Inhale 2 L into the lungs continuous., Disp: , Rfl:  .  verapamil (CALAN-SR) 240 MG CR tablet, TAKE 1 TABLET AT BEDTIME  (Patient taking differently: Take 240 mg by mouth at bedtime. ), Disp: 90 tablet, Rfl: 0   PHYSICAL EXAM SECTION: BP 104/61   Pulse 93   Ht 6' (1.829 m)   Wt 220 lb (99.8 kg)   BMI 29.84 kg/m   General appearance the patient is normally developed grooming and hygiene are normal  Oriented x3  Mood pleasant affect normal  Gait unable to stand and walk his bed to chair in a wheelchair with maximum assist  Extremity #1 right knee Inspection tenderness and swelling with joint effusion, tenderness proximal fibula Range of motion decreased passive and active range of motion Stability tests were normal Strength is 5/5 Skin was warm dry and intact without rash lesion or ulceration Pulse and perfusion normal without edema Normal sensation  Extremity #2 knee Inspection no swelling or tenderness Range of motion arc of flexion 115 degrees  stability tests were normal Strength 5 out of 5 to manual muscle test Skin warm dry and intact without rash lesions or ulceration   MEDICAL DECISION SECTION:  Encounter Diagnosis  Name Primary?  . Closed fracture of proximal end of right fibula, unspecified fracture morphology, initial encounter Yes    Imaging Independent x-ray assessment Mild arthritis right knee with proximal fibular fracture nondisplaced   ADDENDUM REPORT: 07/13/2018 09:59   ADDENDUM: Reviewed exam at request of ER provider.   Patient is tender at the proximal RIGHT fibula.   Images demonstrate a nondisplaced fracture at the RIGHT fibular neck.   No additional fracture or dislocation identified.   Remaining findings as previously dictated.     Electronically Signed   By: Lavonia Dana M.D.   On: 07/13/2018 09:59   Plan:  (Rx., Inj., surg., Frx, MRI/CT, XR:2) Closed treatment of fracture Ice 3-4 times a day X-ray in 6 weeks Full weightbearing as tolerated

## 2018-07-20 DIAGNOSIS — J449 Chronic obstructive pulmonary disease, unspecified: Secondary | ICD-10-CM | POA: Diagnosis not present

## 2018-08-04 ENCOUNTER — Other Ambulatory Visit: Payer: Self-pay | Admitting: Family Medicine

## 2018-08-10 DIAGNOSIS — M158 Other polyosteoarthritis: Secondary | ICD-10-CM | POA: Diagnosis not present

## 2018-08-10 DIAGNOSIS — K219 Gastro-esophageal reflux disease without esophagitis: Secondary | ICD-10-CM | POA: Diagnosis not present

## 2018-08-10 DIAGNOSIS — F329 Major depressive disorder, single episode, unspecified: Secondary | ICD-10-CM | POA: Diagnosis not present

## 2018-08-10 DIAGNOSIS — E785 Hyperlipidemia, unspecified: Secondary | ICD-10-CM | POA: Diagnosis not present

## 2018-08-10 DIAGNOSIS — M810 Age-related osteoporosis without current pathological fracture: Secondary | ICD-10-CM | POA: Diagnosis not present

## 2018-08-10 DIAGNOSIS — F0390 Unspecified dementia without behavioral disturbance: Secondary | ICD-10-CM | POA: Diagnosis not present

## 2018-08-10 DIAGNOSIS — J449 Chronic obstructive pulmonary disease, unspecified: Secondary | ICD-10-CM | POA: Diagnosis not present

## 2018-08-10 DIAGNOSIS — J301 Allergic rhinitis due to pollen: Secondary | ICD-10-CM | POA: Diagnosis not present

## 2018-08-10 DIAGNOSIS — I129 Hypertensive chronic kidney disease with stage 1 through stage 4 chronic kidney disease, or unspecified chronic kidney disease: Secondary | ICD-10-CM | POA: Diagnosis not present

## 2018-08-12 DIAGNOSIS — J449 Chronic obstructive pulmonary disease, unspecified: Secondary | ICD-10-CM | POA: Diagnosis not present

## 2018-08-19 DIAGNOSIS — J449 Chronic obstructive pulmonary disease, unspecified: Secondary | ICD-10-CM | POA: Diagnosis not present

## 2018-08-30 ENCOUNTER — Ambulatory Visit: Payer: Medicare PPO | Admitting: Orthopedic Surgery

## 2018-09-05 DIAGNOSIS — S82831A Other fracture of upper and lower end of right fibula, initial encounter for closed fracture: Secondary | ICD-10-CM

## 2018-09-05 HISTORY — DX: Other fracture of upper and lower end of right fibula, initial encounter for closed fracture: S82.831A

## 2018-09-06 ENCOUNTER — Other Ambulatory Visit: Payer: Self-pay | Admitting: Family Medicine

## 2018-09-06 ENCOUNTER — Ambulatory Visit (INDEPENDENT_AMBULATORY_CARE_PROVIDER_SITE_OTHER): Payer: Medicare PPO | Admitting: Orthopedic Surgery

## 2018-09-06 ENCOUNTER — Ambulatory Visit (INDEPENDENT_AMBULATORY_CARE_PROVIDER_SITE_OTHER): Payer: Medicare PPO

## 2018-09-06 DIAGNOSIS — S82831D Other fracture of upper and lower end of right fibula, subsequent encounter for closed fracture with routine healing: Secondary | ICD-10-CM

## 2018-09-06 DIAGNOSIS — R413 Other amnesia: Secondary | ICD-10-CM

## 2018-09-06 NOTE — Progress Notes (Signed)
Chief Complaint  Patient presents with  . Follow-up    Recheck on right fibula fracture, DOI 07-13-18.    Proximal right fibular fracture  X-ray today shows fracture healing.  X-ray was compared to last x-ray on August 28  Fracture callus is noted around the fracture is nondisplaced  Normal exam today other than her chronic valgus knee with crepitance fibular fracture is nontender  Patient should be able to resume normal activities

## 2018-09-11 ENCOUNTER — Other Ambulatory Visit: Payer: Self-pay

## 2018-09-11 DIAGNOSIS — J449 Chronic obstructive pulmonary disease, unspecified: Secondary | ICD-10-CM | POA: Diagnosis not present

## 2018-09-11 MED ORDER — AMLODIPINE BESYLATE 2.5 MG PO TABS: 2.5000 mg | ORAL_TABLET | Freq: Every day | ORAL | 1 refills | Status: DC

## 2018-09-19 DIAGNOSIS — J449 Chronic obstructive pulmonary disease, unspecified: Secondary | ICD-10-CM | POA: Diagnosis not present

## 2018-10-12 DIAGNOSIS — J449 Chronic obstructive pulmonary disease, unspecified: Secondary | ICD-10-CM | POA: Diagnosis not present

## 2018-10-19 DIAGNOSIS — J449 Chronic obstructive pulmonary disease, unspecified: Secondary | ICD-10-CM | POA: Diagnosis not present

## 2018-10-23 ENCOUNTER — Encounter: Payer: Self-pay | Admitting: *Deleted

## 2018-11-11 DIAGNOSIS — J449 Chronic obstructive pulmonary disease, unspecified: Secondary | ICD-10-CM | POA: Diagnosis not present

## 2018-11-13 ENCOUNTER — Other Ambulatory Visit: Payer: Self-pay | Admitting: Family Medicine

## 2018-11-19 DIAGNOSIS — J449 Chronic obstructive pulmonary disease, unspecified: Secondary | ICD-10-CM | POA: Diagnosis not present

## 2018-11-22 ENCOUNTER — Ambulatory Visit: Payer: Medicare PPO | Admitting: Family Medicine

## 2018-11-23 ENCOUNTER — Other Ambulatory Visit: Payer: Self-pay

## 2018-11-23 ENCOUNTER — Telehealth: Payer: Self-pay

## 2018-11-23 DIAGNOSIS — J449 Chronic obstructive pulmonary disease, unspecified: Secondary | ICD-10-CM

## 2018-11-23 MED ORDER — ARFORMOTEROL TARTRATE 15 MCG/2ML IN NEBU: 15.0000 ug | INHALATION_SOLUTION | Freq: Two times a day (BID) | RESPIRATORY_TRACT | 1 refills | Status: DC

## 2018-11-23 NOTE — Progress Notes (Signed)
Trying to fill at [facility] and see if medication is cheaper

## 2018-11-23 NOTE — Progress Notes (Signed)
Sent RX to [facility] to find out cost

## 2018-11-23 NOTE — Telephone Encounter (Signed)
Update on medication cost problem for the prescribed Brovana. I called [facility] pharmacy as well as [facility], they say that the only other long acting COPD med to compare with Garlon Hatchet would be Perforomist, which is going to be just as expensive as Brovana, neither drug have a generic form as of yet. I also tried getting a quote for Brovana using a discount coupon that I printed from the internet, and called [facility] phamacy and had them run the script through using the coupon. That cost for the medication was $3300. Not sure where to go from here, please advise.

## 2018-11-23 NOTE — Telephone Encounter (Signed)
pls send a request to pharmacy for the preferred alternatives I will go from there

## 2018-11-23 NOTE — Telephone Encounter (Signed)
The medication Garlon Hatchet that was ordered for patient has an out of pocket cost of [phone], can you prescribe something different that may be cheaper. Please advise, thank you

## 2018-11-24 NOTE — Telephone Encounter (Signed)
Working on this! Spoke with daughter Fabiola Backer and made her aware that we are doing our best, and will call her as soon as we know something

## 2018-11-24 NOTE — Telephone Encounter (Signed)
Please continue to work through this , as we discussed , The NEXT option is tocall [facility] speak directly with pharmacist / case worker or nurse or social worker ,nmake them aware of the problem and solicit their help AS WELL Also she should be able to get her med free through filling out patient assistance forms through the health dept Pls let pt and family know that this is being actively addressed

## 2018-11-27 ENCOUNTER — Other Ambulatory Visit: Payer: Self-pay

## 2018-11-27 ENCOUNTER — Telehealth: Payer: Self-pay

## 2018-11-27 NOTE — Telephone Encounter (Signed)
Referral to [facility] made for help with Brovana medication. Daughter Shontel notified

## 2018-11-27 NOTE — Telephone Encounter (Signed)
Referral sent to [facility] 11-27-18, daughter aware

## 2018-11-27 NOTE — Patient Outreach (Signed)
[facility]) Care Management  11/27/2018  [name] 1941/03/07 [id number]   Referral Date: 11/27/2018 Referral Source: MD referral Referral Reason: Problems affording Brovana   Outreach Attempt: spoke with daughter [name].  She is able to verify HIPAA.  Discussed reason for referral. She states that the [name] was over $600 when they tried to fill it and they just cannot afford it.    Patient lives alone but patient has two daughters who are active in her care and care for patient.  Daughter [name] fills her pill box and makes sure patient takes her medication. Her sister helps with bathing and dressing patient.    [name] states patient has some dementia but is functional.  She requires assist with all aspects of care.  Patient also has COPD, HTN, Falls, and Hyperlipidemia.  Patient has an appointment with Dr. Moshe Cipro this month and her daughters will make sure she gets there.   Discussed [facility] services with daughter.  She is agreeable to pharmacy for medication assistance at this time.     Plan: RN CM will refer to pharmacy for medication assistance.     Gabriella Baseman, RN, MSN [facility] Care Management Care Management Coordinator Direct Line [phone] Toll Free: [phone]  Fax: [phone]

## 2018-12-01 ENCOUNTER — Ambulatory Visit: Payer: Self-pay | Admitting: Pharmacist

## 2018-12-04 ENCOUNTER — Ambulatory Visit: Payer: Self-pay | Admitting: Pharmacist

## 2018-12-06 ENCOUNTER — Telehealth: Payer: Self-pay | Admitting: Family Medicine

## 2018-12-06 ENCOUNTER — Other Ambulatory Visit: Payer: Self-pay | Admitting: Pharmacist

## 2018-12-06 NOTE — Telephone Encounter (Signed)
[facility] Pharmacist provided insurance information.  Copay for Garlon Hatchet would be $112.74 at [place] via Part D medicare plan.  New RX needed for Brovana (including ICD-10 code for COPD) in order for claim to be submitted via Part B medicare.  This option could potentially be cheaper.    [facility] will attempt patient assistance with Albuterol rescue.  We will be able to obtain brand name [name] (albuterol) inhaler via [employer]'s patient assistance program.   Will attempt patient assistance via [name] for [name] (olodaterol) Respimat inhaler which is an alternative LABA that delivers a fine mist for the patient to mimic the nebulized effect if PCP/pulmonology deems appropriate Please advise if you would like to switch to the alternative?

## 2018-12-06 NOTE — Telephone Encounter (Signed)
Almyra Free called from [facility] stating the brovana was brought down to 112.00 for pt but they still said that was too high for them. [facility] was going to fax over a new prescription but she stated that if Stravanty which is a nebulized inhaler long acting like the brovana could be free if switched by Dr. Moshe Cipro. Also the albuterol rescue inhaler was free through [facility].  Can be reached at [phone]

## 2018-12-06 NOTE — Patient Outreach (Addendum)
[facility]) Care Management  [place]   12/06/2018  [name] [birth year] [id number]  Reason for referral: medication assistance   Referral source: PCP Referral medication(s): COPD medications Current insurance: Humana  PMHx: COPD, HDL, GERD, HTN  Patient Outreach: Successful outreach call to patient's daughter [name]) with HIPAA identifiers verified x2.  [name] states that they were quoted >$600/month for [employer].  [name] Pharmacist called [name] and they did not have [name] on file.  Also, patient has $160 deductible.  [place] is unable to bill Part B insurance which is the preferred method of billing nebulized medications.  If [name] for 61-monththe copay would be $212.  [name] states that they are unable to afford this.  She states that her mom has received nebulized medications via [employer]Schering-[employer]oughin the past.  Of note, patient does need Albuterol (insurance prefers Ventolin) rescue inhaler [place] [employer] [facility]   [place].  They were not running Brovana through on patient's insurance.  [facility] provided insurance information.  Copay for BGarlon Hatchetwould be $112.74 via Part D medicare plan.  New RX needed for Brovana (including ICD-10 code for COPD) in order for claim to be submitted via Part B medicare.  This option could potentially be cheaper.    TCoalingwill attempt patient assistance with Albuterol rescue.  We will be able to obtain brand name [name] (albuterol) inhaler via Merck's patient assistance program.   Will attempt patient assistance via [name] for [name] (olodaterol) Respimat inhaler which is an alternative LABA that delivers a fine mist for the patient to mimic the nebulized effect if PCP/pulmonology deems appropriate.  Successful call to Dr. SGriffin Dakinoffice.  Left message regarding BGarlon Hatchetand  potential alternatives as well as patient assistance for the aforementioned products.  Objective: Allergies  Allergen Reactions  . Fenofibrate Nausea And Vomiting  . Pravastatin Sodium Nausea And Vomiting  . Sulfonamide Derivatives Other (See Comments)    unknown  . Ciprofloxacin Rash    Medications Reviewed Today    Reviewed by PLavera Guise [facility](Pharmacist) on 12/06/18 at 1021  Med List Status: <None>  Medication Order Taking? Sig Documenting Provider Last Dose Status Informant  acetaminophen (TYLENOL) 650 MG CR tablet [id number] Take 650 mg by mouth every 8 (eight) hours as needed for pain. [provider] 07/12/2018 Unknown time Active Family Member  Albuterol Sulfate (PROAIR RESPICLICK) 1384(90 Base) MCG/ACT AEPB [id number] Inhale 2 puffs into the lungs every 8 (eight) hours as needed.  Patient taking differently:  Inhale 2 puffs into the lungs every 8 (eight) hours as needed (for shortness of breath).    SFayrene Helper MD unknown Active Family Member  alendronate (FOSAMAX) 70 MG tablet [id number] TAKE 1 TABLET ONE TIME A WEEK. TAKE WITH A FULL GLASS OF WATER ON AN EMPTY STOMACH.  Patient taking differently:  Take 70 mg by mouth every Sunday.    SFayrene Helper MD 07/09/2018 Unknown time Active Family Member  amLODipine (NORVASC) 2.5 MG tablet [id number] Take 1 tablet (2.5 mg total) by mouth daily. SFayrene Helper MD  Active   arformoterol ([facility] 15 MCG/2ML NEBU [id number] Take 2 mLs (15 mcg total) by nebulization 2 (two) times daily. SFayrene Helper MD  Active   aspirin EC 81 MG tablet [id number] Take 81 mg by mouth daily. [provider] 07/12/2018 Unknown  time Active Family Member  atorvastatin (LIPITOR) 10 MG tablet [id number]  TAKE 1 TABLET EVERY DAY [name], MD  Active   calcium-vitamin D (OSCAL WITH D) 500-200 MG-UNIT per tablet [id number] No Take 1 tablet by mouth daily.  [provider] 07/12/2018 Unknown time  Active Family Member  donepezil (ARICEPT) 10 MG tablet [id number]  TAKE 1 TABLET AT BEDTIME  [name], MD  Active   ENSURE [name]) [id number] No Take 1 Can by mouth daily as needed. Take one by mouth with meals  [provider] Taking Active Family Member  FLUoxetine (PROZAC) 40 MG capsule [id number]  TAKE 1 CAPSULE EVERY DAY [name], MD  Active   loratadine (CLARITIN) 10 MG tablet [id number] No Take 10 mg by mouth daily as needed for allergies.  [provider] 07/12/2018 Unknown time Active Family Member  Multiple Vitamins-Minerals (CEROVITE SENIOR) TABS [id number] No Take 1 tablet by mouth daily.  [provider] 07/12/2018 Unknown time Active Family Member  omeprazole (PRILOSEC) 40 MG capsule [id number]  TAKE 1 CAPSULE EVERY DAY [name], MD  Active   OXYGEN [id number] No Inhale 2 L into the lungs continuous. [provider] 07/12/2018 Unknown time Active   verapamil (CALAN-SR) 240 MG CR tablet [id number]  TAKE 1 TABLET AT BEDTIME [name], MD  Active           Assessment:  Drugs sorted by system:  Neurologic/Psychologic: donepezil, fluoxetine  Cardiovascular: amlodipine, aspirin, atorvastatin, verapamil  Pulmonary/Allergy: albuterol rescue inhaler, Brovana (aformotorol), loratadine  Gastrointestinal: omeprazole  Endocrine: alendronate  Vitamins/Minerals/Supplements: Calcium/vitamin D  Medication Review Findings:  . All other medications appear appropriate per medical history.  Patient states all other medications affordable (excluding inhalers).  Medication Assistance Findings:  Medication assistance needs identified.   Extra Help:   '[]'  Already receiving Full Extra Help  '[]'  Already receiving Partial Extra Help  '[]'  Eligible based on reported income and assets  '[x]'  Not Eligible based on reported income and assets  Patient Assistance Programs: 1) [name] (albuterol inhaler) made by [employer] o Income  requirement met: '[x]'  Yes '[]'  No '[]'  Unknown o Out-of-pocket prescription expenditure met:    '[]'  Yes '[]'  No  '[]'  Unknown  '[x]'  Not applicable - Patient has met application requirements to apply for this patient assistance program.    Plan: I will route patient assistance letter to [place] technician who will coordinate patient assistance program application process for medications listed above.  [facility] pharmacy technician will assist with obtaining all required documents from both patient and provider(s) and submit application(s) once completed.  I will follow up with patient & MD next week to mail out patient assistance forms once therapeutic decision determined.   Regina Eck, PharmD, [place]  [phone]

## 2018-12-11 ENCOUNTER — Other Ambulatory Visit: Payer: Self-pay | Admitting: Pharmacist

## 2018-12-11 ENCOUNTER — Other Ambulatory Visit: Payer: Self-pay

## 2018-12-11 ENCOUNTER — Ambulatory Visit: Payer: Self-pay | Admitting: Pharmacist

## 2018-12-11 ENCOUNTER — Other Ambulatory Visit: Payer: Self-pay | Admitting: Pharmacy Technician

## 2018-12-11 DIAGNOSIS — J449 Chronic obstructive pulmonary disease, unspecified: Secondary | ICD-10-CM

## 2018-12-11 MED ORDER — ARFORMOTEROL TARTRATE 15 MCG/2ML IN NEBU: 15.0000 ug | INHALATION_SOLUTION | Freq: Two times a day (BID) | RESPIRATORY_TRACT | 1 refills | Status: DC

## 2018-12-11 NOTE — Patient Outreach (Signed)
[facility]) Care Management  12/11/2018  [name] [birth year] [id number]                                                  Medication Assistance Referral  Referral From: [facility] RPh Jenne Pane.  Medication/Company: [name]  HFA / Merck Patient application portion:  Mailed Provider application portion:  Mailed to Dr. Moshe Cipro  Follow up:  Will follow up with patient in 5-7 business days to confirm application(s) have been received.  Maud Deed Chana Bode [place] Certified Pharmacy Technician [place] Management Direct Dial:[phone]

## 2018-12-11 NOTE — Patient Outreach (Addendum)
[facility]) Care Management  [place]   12/11/2018  [name] 07/27/1941 [id number]  Reason for referral: Medication Assistance with [place] & COPD medications  Referral source: Dr. Moshe Cipro Current insurance:Humana  Outreach:  Successful care coordination call to [place] to inquire about [name]'s [employer].  HIPAA identifiers verified.  [facility] has received new Brovana RX with ICD-10 from Dr. Griffin Dakin office, however is unable to bill as they had hoped.  Insurance rejection message stating: "Humana HMO" cannot process.  When [facility] mail order pharmacy was called, they stated that they were unable to process Part B for nebulized medications.  Care coordination call place to [facility] which provides mail order nebulized medications for patients.  The fax number for mail order prescriptions is [phone].  Patient would likely still be responsible for 20% cost of the cash price which would be approximately $150-200.    Alternatively, the patient could be switched to Striverdi Respimat (generic Olodaterol-long acting beta agonist) which "mimics" the delivery of a nebulized medication if the MD would deem therapeutically appropriate.  Striverdi Respimat is another long Research officer, trade [employer] like Brovana (aformoterol). Striverdi  Respimat would be free to the patient via the manufacturer.  Scheduled albuterol nebs (SABA) could be an option, but has the potential for more side effects (tachycardia, etc).  Please let us know how we can assist this patient! We are in the process of getting [name]'s Albuterol inhaler via patient assistance through [employer].  This process takes about 1.5 months, but will provide patient with a 1-year supply of albuterol inhalers.  Message left with daughter [name]) to provide updates & remind her to mail Merck application back once she receives it.  Plan: -I will follow up with therapy plan as details  are given. -I will follow up with the patient to ensure she has received application for Albuterol inhalers  Regina Eck, PharmD, [place]  [phone]

## 2018-12-12 DIAGNOSIS — J449 Chronic obstructive pulmonary disease, unspecified: Secondary | ICD-10-CM | POA: Diagnosis not present

## 2018-12-13 ENCOUNTER — Encounter: Payer: Self-pay | Admitting: Family Medicine

## 2018-12-13 ENCOUNTER — Ambulatory Visit (INDEPENDENT_AMBULATORY_CARE_PROVIDER_SITE_OTHER): Payer: Medicare PPO | Admitting: Family Medicine

## 2018-12-13 VITALS — BP 122/58 | HR 90 | Resp 12 | Ht 73.0 in | Wt 215.0 lb

## 2018-12-13 DIAGNOSIS — F0390 Unspecified dementia without behavioral disturbance: Secondary | ICD-10-CM

## 2018-12-13 DIAGNOSIS — E785 Hyperlipidemia, unspecified: Secondary | ICD-10-CM | POA: Diagnosis not present

## 2018-12-13 DIAGNOSIS — R0902 Hypoxemia: Secondary | ICD-10-CM

## 2018-12-13 DIAGNOSIS — K219 Gastro-esophageal reflux disease without esophagitis: Secondary | ICD-10-CM

## 2018-12-13 DIAGNOSIS — F0150 Vascular dementia without behavioral disturbance: Secondary | ICD-10-CM | POA: Diagnosis not present

## 2018-12-13 DIAGNOSIS — F441 Dissociative fugue: Secondary | ICD-10-CM

## 2018-12-13 DIAGNOSIS — I10 Essential (primary) hypertension: Secondary | ICD-10-CM

## 2018-12-13 DIAGNOSIS — J449 Chronic obstructive pulmonary disease, unspecified: Secondary | ICD-10-CM | POA: Diagnosis not present

## 2018-12-13 DIAGNOSIS — F324 Major depressive disorder, single episode, in partial remission: Secondary | ICD-10-CM

## 2018-12-13 DIAGNOSIS — Z9181 History of falling: Secondary | ICD-10-CM

## 2018-12-13 DIAGNOSIS — Z23 Encounter for immunization: Secondary | ICD-10-CM | POA: Diagnosis not present

## 2018-12-13 MED ORDER — ALBUTEROL SULFATE HFA 108 (90 BASE) MCG/ACT IN AERS
2.0000 | INHALATION_SPRAY | Freq: Four times a day (QID) | RESPIRATORY_TRACT | 1 refills | Status: DC | PRN
Start: 2018-12-13 — End: 2020-07-14

## 2018-12-13 MED ORDER — FLUOXETINE HCL 20 MG PO TABS: 20.0000 mg | ORAL_TABLET | Freq: Every day | ORAL | 3 refills | Status: DC

## 2018-12-13 NOTE — Patient Instructions (Addendum)
F/U in  4 months  CBC, lipid, cmp and eGFR, today    Flu vaccine today  Pls follow through on medication we will also keep in touch about this

## 2018-12-14 LAB — CBC
HCT: 36.5 % (ref 35.0–45.0)
Hemoglobin: 11.3 g/dL — ABNORMAL LOW (ref 11.7–15.5)
MCH: 25.6 pg — AB (ref 27.0–33.0)
MCHC: 31 g/dL — ABNORMAL LOW (ref 32.0–36.0)
MCV: 82.6 fL (ref 80.0–100.0)
MPV: 10.1 fL (ref 7.5–12.5)
PLATELETS: 279 10*3/uL (ref 140–400)
RBC: 4.42 10*6/uL (ref 3.80–5.10)
RDW: 15 % (ref 11.0–15.0)
WBC: 6.8 10*3/uL (ref 3.8–10.8)

## 2018-12-14 LAB — COMPLETE METABOLIC PANEL WITH GFR
AG Ratio: 1.6 (calc) (ref 1.0–2.5)
ALT: 11 U/L (ref 6–29)
AST: 13 U/L (ref 10–35)
Albumin: 4.1 g/dL (ref 3.6–5.1)
Alkaline phosphatase (APISO): 75 U/L (ref 33–130)
BUN: 22 mg/dL (ref 7–25)
CALCIUM: 9.9 mg/dL (ref 8.6–10.4)
CHLORIDE: 105 mmol/L (ref 98–110)
CO2: 32 mmol/L (ref 20–32)
Creat: 0.82 mg/dL (ref 0.60–0.93)
GFR, EST AFRICAN AMERICAN: 80 mL/min/{1.73_m2} (ref >=60)
GFR, EST NON AFRICAN AMERICAN: 69 mL/min/{1.73_m2} (ref >=60)
GLOBULIN: 2.5 g/dL (ref 1.9–3.7)
Glucose, Bld: 89 mg/dL (ref 65–139)
Potassium: 4.1 mmol/L (ref 3.5–5.3)
Sodium: 146 mmol/L (ref 135–146)
TOTAL PROTEIN: 6.6 g/dL (ref 6.1–8.1)
Total Bilirubin: 0.3 mg/dL (ref 0.2–1.2)

## 2018-12-14 LAB — LIPID PANEL
CHOL/HDL RATIO: 2.3 (calc) (ref <5.0)
CHOLESTEROL: 160 mg/dL (ref <200)
HDL: 70 mg/dL (ref >50)
LDL Cholesterol (Calc): 68 mg/dL (calc)
NON-HDL CHOLESTEROL (CALC): 90 mg/dL (ref <130)
Triglycerides: 137 mg/dL (ref <150)

## 2018-12-14 MED ORDER — OLODATEROL HCL 2.5 MCG/ACT IN AERS: INHALATION_SPRAY | RESPIRATORY_TRACT | 3 refills | Status: DC

## 2018-12-14 NOTE — Telephone Encounter (Signed)
pls copy and mail the the prescription for albuterol for the patient , left in your box

## 2018-12-15 NOTE — Telephone Encounter (Signed)
Application copied and mailed for patient

## 2018-12-18 ENCOUNTER — Other Ambulatory Visit: Payer: Self-pay | Admitting: Pharmacy Technician

## 2018-12-18 NOTE — Patient Outreach (Signed)
[facility]) Care Management  12/18/2018  [name] 1941-07-09 [id number]                                                  Medication Assistance Referral  Referral From: [facility] RPh Jenne Pane  Medication/Company: Gerald Stabs / [employer]ringer[employer] Patient application portion:  Mailed Provider application portion: Faxed  to Dr. Moshe Cipro  Follow up:  Will follow up with patient in 5-7 business days to confirm application(s) have been received.  Maud Deed Chana Bode [place] Certified Pharmacy Technician [place] Management Direct Dial:[phone]

## 2018-12-19 ENCOUNTER — Other Ambulatory Visit: Payer: Self-pay | Admitting: Pharmacy Technician

## 2018-12-19 ENCOUNTER — Ambulatory Visit: Payer: Self-pay | Admitting: Pharmacist

## 2018-12-19 NOTE — Patient Outreach (Signed)
[facility]) Care Management  12/19/2018  [name] 01-Sep-1941 [id number]    Unsuccessful call #1 placed to patients daughter, [name] regarding patient assistance application(s) for [name] HFA , HIPAA compliant voicemail left.   Will make 2nd call attempt in 2-3 business days if call has not been returned.  Maud Deed Chana Bode [place] Certified Pharmacy Technician [place] Management Direct [phone]

## 2018-12-20 DIAGNOSIS — J449 Chronic obstructive pulmonary disease, unspecified: Secondary | ICD-10-CM | POA: Diagnosis not present

## 2018-12-21 ENCOUNTER — Other Ambulatory Visit: Payer: Self-pay | Admitting: Pharmacist

## 2018-12-21 NOTE — Patient Outreach (Addendum)
[facility]) Care Management  [place]  12/21/2018  [name] 02/20/1941 [id number]   Reason for call: med assist  Incoming call from [place] (patient's daughter) with HIPAA identifiers verified.  [name] states she has received PAP applications and is mailing them back to [place] by next week with the required information.  Reviewed the purpose of the new inhaler Striverdi.  Will also f/u to have albuterol nebs PRN called in to patient's pharmacy for back up until [place] is approved.  Patient and family are grateful of efforts  PLAN: -I will f/u with patient and daughter next month regarding PAP status and to assess additional needs  Regina Eck, PharmD, [place]  [phone]

## 2018-12-22 ENCOUNTER — Ambulatory Visit: Payer: Self-pay | Admitting: Pharmacist

## 2018-12-27 ENCOUNTER — Ambulatory Visit: Payer: Self-pay | Admitting: Pharmacist

## 2018-12-28 ENCOUNTER — Ambulatory Visit: Payer: Self-pay | Admitting: Pharmacist

## 2018-12-30 ENCOUNTER — Encounter: Payer: Self-pay | Admitting: Family Medicine

## 2018-12-30 DIAGNOSIS — F324 Major depressive disorder, single episode, in partial remission: Secondary | ICD-10-CM | POA: Insufficient documentation

## 2018-12-30 NOTE — Assessment & Plan Note (Signed)
Fracture in 2019 since last visit. Home safety re addressed, reviewed adequate lighting,  No clutter and use of assistive device as well as ensured necessary equipment in home to accommodate safe environ, no needs at this time

## 2018-12-30 NOTE — Assessment & Plan Note (Signed)
Hyperlipidemia:Low fat diet discussed and encouraged.   Lipid Panel  Lab Results  Component Value Date   CHOL 160 12/13/2018   HDL 70 12/13/2018   LDLCALC 68 12/13/2018   TRIG 137 12/13/2018   CHOLHDL 2.3 12/13/2018   Controlled, no change in medication

## 2018-12-30 NOTE — Progress Notes (Signed)
[name]     MRN: [id number]      DOB: [birth year]   HPI [name] is here for follow up and re-evaluation of chronic medical conditions, medication management and review of any available recent lab and radiology data.  Preventive health is updated, specifically  Cancer screening and Immunization.   Questions or concerns regarding consultations or procedures which the PT has had in the interim are  addressed. The PT denies any adverse reactions to current medications since the last visit.  Experiencing difficulty with medication for her COPD, we will continue to work with family on this ROS Denies recent fever or chills. Denies sinus pressure, nasal congestion, ear pain or sore throat. Denies chest congestion, productive cough or wheezing. Denies chest pains, palpitations and leg swelling Denies abdominal pain, nausea, vomiting,diarrhea or constipation.   Denies dysuria, frequency, hesitancy or incontinence. C/o chronic joint pain, swelling and limitation in mobility. Denies headaches, seizures, numbness, or tingling. Denies depression, anxiety or insomnia. Denies skin break down or rash.   PE  BP (!) 122/58   Pulse 90   Resp 12   Ht 6\' 1"  (1.854 m)   Wt 215 lb 0.6 oz (97.5 kg)   SpO2 91% Comment: with 2 lpm supplemental oxygen via [place]  BMI 28.37 kg/m   Patient alert and oriented and in no cardiopulmonary distress.on supplemental oxygen, and ambulates with a assistance/ wheelchair  HEENT: No facial asymmetry, EOMI,   oropharynx pink and moist.  Neck supple no JVD, no mass.  Chest: Clear to auscultation bilaterally.Decreased air entry  CVS: S1, S2 no murmurs, no S3.Regular rate.  ABD: Soft non tender.   Ext: No edema  MS: Decreased ROM spine, shoulders, hips and knees.  Skin: Intact, no ulcerations or rash noted.  Psych: Good eye contact, normal affect. Memory intact not anxious or depressed appearing.  CNS: CN 2-12 intact, power,  normal  throughout.no focal deficits noted.   Assessment & Plan  Essential hypertension Controlled, no change in medication DASH diet and commitment to daily physical activity for a minimum of 30 minutes discussed and encouraged, as a part of hypertension management. The importance of attaining a healthy weight is also discussed.  BP/Weight 12/13/2018 09/06/2018 07/19/2018 07/13/2018 07/12/2018 7/82/9562 11/18/863  Systolic BP 784 696 295 284 132 440 102  Diastolic BP 58 77 61 89 725 90 80  Wt. (Lbs) 215.04 220 220 - - 221 217  BMI 28.37 29.84 29.84 - - 29.16 28.63       Hyperlipidemia LDL goal <100 Hyperlipidemia:Low fat diet discussed and encouraged.   Lipid Panel  Lab Results  Component Value Date   CHOL 160 12/13/2018   HDL 70 12/13/2018   LDLCALC 68 12/13/2018   TRIG 137 12/13/2018   CHOLHDL 2.3 12/13/2018   Controlled, no change in medication     Dementia Continue aricept, needs MMSE at next visit, no behavioral disturbance  COPD with hypoxia (HCC) Stable, need to address medication due to formulary change  At high risk for falls Fracture in 2019 since last visit. Home safety re addressed, reviewed adequate lighting,  No clutter and use of assistive device as well as ensured necessary equipment in home to accommodate safe environ, no needs at this time  GERD Controlled, no change in medication   Need for immunization against influenza After obtaining informed consent, the vaccine is  administered , with no adverse effect noted at the time of administration.   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

## 2018-12-30 NOTE — Assessment & Plan Note (Signed)
Controlled, no change in medication  

## 2018-12-30 NOTE — Assessment & Plan Note (Signed)
Controlled, no change in medication DASH diet and commitment to daily physical activity for a minimum of 30 minutes discussed and encouraged, as a part of hypertension management. The importance of attaining a healthy weight is also discussed.  BP/Weight 12/13/2018 09/06/2018 07/19/2018 07/13/2018 07/12/2018 9/79/4801 04/19/5373  Systolic BP 827 078 675 449 201 007 121  Diastolic BP 58 77 61 89 975 90 80  Wt. (Lbs) 215.04 220 220 - - 221 217  BMI 28.37 29.84 29.84 - - 29.16 28.63

## 2018-12-30 NOTE — Assessment & Plan Note (Signed)
Stable, need to address medication due to formulary change

## 2018-12-30 NOTE — Assessment & Plan Note (Signed)
Continue aricept, needs MMSE at next visit, no behavioral disturbance

## 2018-12-30 NOTE — Assessment & Plan Note (Signed)
After obtaining informed consent, the vaccine is  administered , with no adverse effect noted at the time of administration.  

## 2019-01-01 ENCOUNTER — Ambulatory Visit: Payer: Self-pay | Admitting: Pharmacist

## 2019-01-01 ENCOUNTER — Other Ambulatory Visit: Payer: Self-pay | Admitting: Pharmacy Technician

## 2019-01-01 NOTE — Patient Outreach (Signed)
[facility]) Care Management  01/01/2019  [name] 1941-11-04 [id number]   Received patient portion(s) of patient assistance application for [name] HFA. Mailed completed application and required documents into Merck.  Will follow up with company in 14-21 business days to check status of application.  Maud Deed Chana Bode [place] Certified Pharmacy Technician [place] Management Direct Dial:[phone]

## 2019-01-03 ENCOUNTER — Ambulatory Visit: Payer: Self-pay | Admitting: Pharmacist

## 2019-01-12 DIAGNOSIS — J449 Chronic obstructive pulmonary disease, unspecified: Secondary | ICD-10-CM | POA: Diagnosis not present

## 2019-01-15 ENCOUNTER — Telehealth: Payer: Self-pay

## 2019-01-15 NOTE — Telephone Encounter (Signed)
Received a call from patient's daughter Fabiola Backer she states that [facility] could not do anything to get the Brovana paid for, she wants to know if you want her mother to just stay on the albuterol? Also states that [facility] will not pay for Prozac 20mg , but will pay for 40mg . She cant break them in half. Please advise

## 2019-01-18 DIAGNOSIS — J449 Chronic obstructive pulmonary disease, unspecified: Secondary | ICD-10-CM | POA: Diagnosis not present

## 2019-01-19 ENCOUNTER — Other Ambulatory Visit: Payer: Self-pay

## 2019-01-19 ENCOUNTER — Ambulatory Visit: Payer: Self-pay | Admitting: Pharmacist

## 2019-01-19 MED ORDER — FLUOXETINE HCL 20 MG PO CAPS: 20.0000 mg | ORAL_CAPSULE | Freq: Every day | ORAL | 3 refills | Status: DC

## 2019-01-19 NOTE — Telephone Encounter (Signed)
Can you please update me on medication needs of this patient

## 2019-01-19 NOTE — Telephone Encounter (Signed)
Brandi, pls address , thanks

## 2019-01-19 NOTE — Telephone Encounter (Signed)
[facility] nurse is checking with the pt assistance foundation asap

## 2019-01-22 ENCOUNTER — Other Ambulatory Visit: Payer: Self-pay | Admitting: Family Medicine

## 2019-01-23 ENCOUNTER — Other Ambulatory Visit: Payer: Self-pay | Admitting: Pharmacy Technician

## 2019-01-23 NOTE — Patient Outreach (Addendum)
[facility]) Care Management  01/23/2019  MASIAH LEWING [birth year] [id number]    Unsuccessful call #1 placed to patients daughter, [name] regarding patient assistance application(s) for Striverdi , HIPAA compliant voicemail left.   Follow up:  Will make 2nd call attempt in 2-3 business days if call has not been returned.  Maud Deed Chana Bode [place] Certified Pharmacy Technician [place] Management Direct Dial:[phone]   ADDENDUM 2:05pm  Return call to patients daughter, [name], HIPAA identifiers verified. [name] states that they never received the Striverdi patient assistance application in the mail. [name] request that I mail another once out to her address that she provided.  Will prepare another application to be mailed out.  Maud Deed Chana Bode [place] Certified Pharmacy Technician [place] Management Direct Dial:[phone]

## 2019-01-26 ENCOUNTER — Other Ambulatory Visit: Payer: Self-pay | Admitting: Pharmacist

## 2019-01-26 NOTE — Patient Outreach (Signed)
[facility]) Care Management  [place]  01/26/2019  [name] August 28, 1941 [id number]   Reason for call: medication assistance.  Call was placed to f/u on PAP applications  Unsuccessful telephone call attempt to patient.   HIPAA compliant voicemail left requesting a return call  Plan:  I will make another outreach attempt to patient within 3-4 business days   Regina Eck, PharmD, [place]  [phone]

## 2019-02-01 ENCOUNTER — Other Ambulatory Visit: Payer: Self-pay | Admitting: Family Medicine

## 2019-02-01 ENCOUNTER — Other Ambulatory Visit: Payer: Self-pay | Admitting: Pharmacy Technician

## 2019-02-01 NOTE — Patient Outreach (Addendum)
[facility]) Care Management  02/01/2019  [name] 16-Apr-1941 [id number]    Follow up call placed to [employer]ck regarding patient assistance application(s) for [name] HFA , Herbie [place] confirms application was received and attestation form was mailed out to patient on 3/7.    Unsuccessful call #1 placed to patients daughter, [name] regarding patient assistance receipt of attestation form from Merck for [name] HFA as well as application for Striverdi that was remailed, HIPAA compliant voicemail left.   Follow up:  Will make 2nd call attempt in 2-3 business days if call has not been returned.  Maud Deed Chana Bode [place] Certified Pharmacy Technician [place] Management Direct Dial:[phone]   ADDENDUM 4:05pm  Incoming call from patient daughter [name], HIPAA identifiers verified. [name] states that she received attestation form form from [place] and was unsure what to do with it. She also confirms she received the [employer] application and mailed it back in 3/18. She think she included the attestation form in the documents that she mailed back to me. She states she still has the original applications. Informed her to keep applications and when I have received other documents I will contact her to see what needs to be done to continue Merck process.  Maud Deed Chana Bode [place] Certified Pharmacy Technician [place] Management Direct Dial:[phone]

## 2019-02-08 ENCOUNTER — Other Ambulatory Visit: Payer: Self-pay | Admitting: Pharmacy Technician

## 2019-02-08 NOTE — Patient Outreach (Signed)
[facility]) Care Management  02/08/2019  [name] 1941/06/23 [id number]   Received patient portion(s) of patient assistance application for Striverdi. Faxed completed application and required documents into [employer].  Will follow up with company in 7-10  business days to check status of application.   Unsuccessful call #1 placed to patients daughter, [name] regarding patient assistance attestation form that was mailed back into me instead of company. HIPAA compliant voicemail left to inform her that I mailed it back to her.  Follow up:  Will make 2nd call attempt in 2-3 business days if call has not been returned.  Maud Deed Chana Bode [place] Certified Pharmacy Technician [place] Management Direct Dial:[phone]

## 2019-02-10 DIAGNOSIS — J449 Chronic obstructive pulmonary disease, unspecified: Secondary | ICD-10-CM | POA: Diagnosis not present

## 2019-02-18 DIAGNOSIS — J449 Chronic obstructive pulmonary disease, unspecified: Secondary | ICD-10-CM | POA: Diagnosis not present

## 2019-02-21 ENCOUNTER — Other Ambulatory Visit: Payer: Self-pay | Admitting: Pharmacy Technician

## 2019-02-21 NOTE — Patient Outreach (Signed)
[facility]) Care Management  02/21/2019  KASHAY CAVENAUGH 02-04-1941 [id number]    Follow up call placed to [employer] regarding patient assistance application(s) for Striverdi , [place] confirms patient has been approved for Striverdi as of 3/27 unitl 11/15/19 .    Successful call placed to patients daughter, [name] regarding patient assistance update for Striverdi, HIPAA identifiers verified. [name] states that patient has received 3 boxes of Striverdi from [employer], she also states she does not have any questions about how to administer medication as this time. [name] states that she mailed back attestation form to [employer] for [employer].  Follow up call to [employer], Lonn [place] states that attestation form has not been received as of yet.  Will follow up with [employer]ck in 3-5 business days to check app status.  Maud Deed Chana Bode [place] Certified Pharmacy Technician [place] Management Direct [phone]

## 2019-02-23 ENCOUNTER — Other Ambulatory Visit: Payer: Self-pay | Admitting: Family Medicine

## 2019-02-23 DIAGNOSIS — R413 Other amnesia: Secondary | ICD-10-CM

## 2019-02-26 ENCOUNTER — Ambulatory Visit: Payer: Medicare PPO

## 2019-02-27 ENCOUNTER — Encounter: Payer: Self-pay | Admitting: Family Medicine

## 2019-02-27 ENCOUNTER — Telehealth: Payer: Self-pay | Admitting: *Deleted

## 2019-02-27 ENCOUNTER — Other Ambulatory Visit: Payer: Self-pay

## 2019-02-27 ENCOUNTER — Ambulatory Visit (INDEPENDENT_AMBULATORY_CARE_PROVIDER_SITE_OTHER): Payer: Medicare PPO | Admitting: Family Medicine

## 2019-02-27 DIAGNOSIS — Z0000 Encounter for general adult medical examination without abnormal findings: Secondary | ICD-10-CM | POA: Diagnosis not present

## 2019-02-27 MED ORDER — AMLODIPINE BESYLATE 2.5 MG PO TABS: 2.5000 mg | ORAL_TABLET | Freq: Every day | ORAL | 1 refills | Status: DC

## 2019-02-27 MED ORDER — ATORVASTATIN CALCIUM 10 MG PO TABS: 10.0000 mg | ORAL_TABLET | Freq: Every day | ORAL | 1 refills | Status: DC

## 2019-02-27 NOTE — Patient Instructions (Signed)
Thank you for completing your annual wellness visit via telemedicine today. I appreciate the opportunity to provide you with the care for your health and wellness. Today we discussed: overall health and wellness. I have attached some preventive information for your and your daughters review.  [place] YOUR HANDS WELL AND FREQUENTLY. AVOID TOUCHING YOUR FACE, UNLESS YOUR HANDS ARE FRESHLY WASHED.  GET FRESH AIR DAILY. STAY HYDRATED WITH WATER.   It was a pleasure to see you and I look forward to continuing to work together on your health and well-being. Please do not hesitate to call the office if you need care or have questions about your care.  Have a wonderful day and week. With Gratitude, Cherly Beach, DNP, AGNP-BC   [name] , Thank you for taking time to come for your Medicare Wellness Visit. I appreciate your ongoing commitment to your health goals. Please review the following plan we discussed and let me know if I can assist you in the future.   Preventive Care 54 Years and Older, Female Preventive care refers to lifestyle choices and visits with your health care provider that can promote health and wellness. What does preventive care include?  A yearly physical exam. This is also called an annual well check.  Dental exams once or twice a year.  Routine eye exams. Ask your health care provider how often you should have your eyes checked.  Personal lifestyle choices, including:  Daily care of your teeth and gums.  Regular physical activity.  Eating a healthy diet.  Avoiding tobacco and drug use.  Limiting alcohol use.  Practicing safe sex.  Taking low-dose aspirin every day.  Taking vitamin and mineral supplements as recommended by your health care provider. What happens during an annual well check? The services and screenings done by your health care provider during your annual well check will depend on your age, overall health, lifestyle risk factors, and  family history of disease. Counseling  Your health care provider may ask you questions about your:  Alcohol use.  Tobacco use.  Drug use.  Emotional well-being.  Home and relationship well-being.  Sexual activity.  Eating habits.  History of falls.  Memory and ability to understand (cognition).  Work and work Statistician.  Reproductive health. Screening  You may have the following tests or measurements:  Height, weight, and BMI.  Blood pressure.  Lipid and cholesterol levels. These may be checked every 5 years, or more frequently if you are over 35 years old.  Skin check.  Lung cancer screening. You may have this screening every year starting at age 3 if you have a 30-pack-year history of smoking and currently smoke or have quit within the past 15 years.  Fecal occult blood test (FOBT) of the stool. You may have this test every year starting at age 46.  Flexible sigmoidoscopy or colonoscopy. You may have a sigmoidoscopy every 5 years or a colonoscopy every 10 years starting at age 22.  Hepatitis C blood test.  Hepatitis B blood test.  Sexually transmitted disease (STD) testing.  Diabetes screening. This is done by checking your blood sugar (glucose) after you have not eaten for a while (fasting). You may have this done every 1-3 years.  Bone density scan. This is done to screen for osteoporosis. You may have this done starting at age 44.  Mammogram. This may be done every 1-2 years. Talk to your health care provider about how often you should have regular mammograms. Talk with your  health care provider about your test results, treatment options, and if necessary, the need for more tests. Vaccines  Your health care provider may recommend certain vaccines, such as:  Influenza vaccine. This is recommended every year.  Tetanus, diphtheria, and acellular pertussis (Tdap, Td) vaccine. You may need a Td booster every 10 years.  Zoster vaccine. You may need this  after age 74.  Pneumococcal 13-valent conjugate (PCV13) vaccine. One dose is recommended after age 12.  Pneumococcal polysaccharide (PPSV23) vaccine. One dose is recommended after age 31. Talk to your health care provider about which screenings and vaccines you need and how often you need them. This information is not intended to replace advice given to you by your health care provider. Make sure you discuss any questions you have with your health care provider. Document Released: 11/28/2015 Document Revised: 07/21/2016 Document Reviewed: 09/02/2015 Elsevier Interactive Patient Education  2017 [place] Prevention in the Home Falls can cause injuries. They can happen to people of all ages. There are many things you can do to make your home safe and to help prevent falls. What can I do on the outside of my home?  Regularly fix the edges of walkways and driveways and fix any cracks.  Remove anything that might make you trip as you walk through a door, such as a raised step or threshold.  Trim any bushes or trees on the path to your home.  Use bright outdoor lighting.  Clear any walking paths of anything that might make someone trip, such as rocks or tools.  Regularly check to see if handrails are loose or broken. Make sure that both sides of any steps have handrails.  Any raised decks and porches should have guardrails on the edges.  Have any leaves, snow, or ice cleared regularly.  Use sand or salt on walking paths during winter.  Clean up any spills in your garage right away. This includes oil or grease spills. What can I do in the bathroom?  Use night lights.  Install grab bars by the toilet and in the tub and shower. Do not use towel bars as grab bars.  Use non-skid mats or decals in the tub or shower.  If you need to sit down in the shower, use a plastic, non-slip stool.  Keep the floor dry. Clean up any water that spills on the floor as soon as it happens.   Remove soap buildup in the tub or shower regularly.  Attach bath mats securely with double-sided non-slip rug tape.  Do not have throw rugs and other things on the floor that can make you trip. What can I do in the bedroom?  Use night lights.  Make sure that you have a light by your bed that is easy to reach.  Do not use any sheets or blankets that are too big for your bed. They should not hang down onto the floor.  Have a firm chair that has side arms. You can use this for support while you get dressed.  Do not have throw rugs and other things on the floor that can make you trip. What can I do in the kitchen?  Clean up any spills right away.  Avoid walking on wet floors.  Keep items that you use a lot in easy-to-reach places.  If you need to reach something above you, use a strong step stool that has a grab bar.  Keep electrical cords out of the way.  Do not use  floor polish or wax that makes floors slippery. If you must use wax, use non-skid floor wax.  Do not have throw rugs and other things on the floor that can make you trip. What can I do with my stairs?  Do not leave any items on the stairs.  Make sure that there are handrails on both sides of the stairs and use them. Fix handrails that are broken or loose. Make sure that handrails are as long as the stairways.  Check any carpeting to make sure that it is firmly attached to the stairs. Fix any carpet that is loose or worn.  Avoid having throw rugs at the top or bottom of the stairs. If you do have throw rugs, attach them to the floor with carpet tape.  Make sure that you have a light switch at the top of the stairs and the bottom of the stairs. If you do not have them, ask someone to add them for you. What else can I do to help prevent falls?  Wear shoes that:  Do not have high heels.  Have rubber bottoms.  Are comfortable and fit you well.  Are closed at the toe. Do not wear sandals.  If you use a  stepladder:  Make sure that it is fully opened. Do not climb a closed stepladder.  Make sure that both sides of the stepladder are locked into place.  Ask someone to hold it for you, if possible.  Clearly mark and make sure that you can see:  Any grab bars or handrails.  First and last steps.  Where the edge of each step is.  Use tools that help you move around (mobility aids) if they are needed. These include:  Canes.  Walkers.  Scooters.  Crutches.  Turn on the lights when you go into a dark area. Replace any light bulbs as soon as they burn out.  Set up your furniture so you have a clear path. Avoid moving your furniture around.  If any of your floors are uneven, fix them.  If there are any pets around you, be aware of where they are.  Review your medicines with your doctor. Some medicines can make you feel dizzy. This can increase your chance of falling. Ask your doctor what other things that you can do to help prevent falls. This information is not intended to replace advice given to you by your health care provider. Make sure you discuss any questions you have with your health care provider. Document Released: 08/28/2009 Document Revised: 04/08/2016 Document Reviewed: 12/06/2014 Elsevier Interactive Patient Education  2017 [employer].

## 2019-02-27 NOTE — Progress Notes (Signed)
Subjective:   [name] is a 78 y.o. female who presents for Medicare Annual (Subsequent) preventive examination.  Location of Patient: Home Location of Provider: Telehealth Consent was obtain for visit to be over via telehealth.  A telemedicine application was used and I verified that I am speaking with the correct person using two identifiers.   Review of Systems:   Cardiac Risk Factors include: advanced age (>66men, >10 women);dyslipidemia;hypertension;sedentary lifestyle     Objective:     Vitals: There were no vitals taken for this visit.  There is no height or weight on file to calculate BMI.  Advanced Directives 02/27/2019 11/27/2018 07/13/2018 07/12/2018 02/20/2018 03/10/2017 01/28/2017  Does Patient Have a Medical Advance Directive? Yes No Yes No Yes Yes No  Type of Advance Directive Healthcare Power of [place] -  Does patient want to make changes to medical advance directive? - - - - No - Patient declined Yes (MAU/Ambulatory/Procedural Areas - Information given) -  Copy of [place] in Chart? No - copy requested - - - - No - copy requested -  Would patient like information on creating a medical advance directive? - No - Patient declined - - - - No - Patient declined  Pre-existing out of facility DNR order (yellow form or pink MOST form) - - - - - - -    Tobacco Social History   Tobacco Use  Smoking Status Former Smoker  . Packs/day: 1.50  . Years: 40.00  . Pack years: 60.00  . Types: Cigarettes  . Last attempt to quit: 06/21/2006  . Years since quitting: 12.6  Smokeless Tobacco Never Used     Counseling given: Not Answered   Clinical Intake:  Pre-visit preparation completed: No  Pain : No/denies pain     Diabetes: No  How often do you need to have someone help you when you read instructions, pamphlets, or other written materials from your doctor or pharmacy?: 5 -  Always  Interpreter Needed?: No  Comments: daughter helped with information  Past Medical History:  Diagnosis Date  . Abnormality of gait 06/06/2014  . Allergic rhinitis   . Anemia   . Ankle fracture, right   . Anxiety   . Arm fracture, left   . Arthritis    abnormal gait   . COPD (chronic obstructive pulmonary disease) (HCC)    chronic CO2 retention, decreased DLCO   . Cystic acne    adult   . 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 Degenerative disc disease, lumbar    L5 nerve impingement   . Dementia ([place])   . Depression   . DNR (do not resuscitate)   . GERD (gastroesophageal reflux disease)   . History of colon surgery    right hemicolectomy for high-grade adenomas in right colon 2013  . Hyperglycemia   . Hypertension   . Low back pain   . Mediastinal lymphadenopathy 1/9   resolving   . Memory difficulty 09/20/2014   mild dementia  . Onychomycosis   . OSA on CPAP   . Sleep apnea    stop bang score 6   Past Surgical History:  Procedure Laterality Date  . ABDOMINAL HYSTERECTOMY  1976   secondary to bleeding   . APPENDECTOMY    . COLON RESECTION  08/18/2012   Procedure: HAND ASSISTED LAPAROSCOPIC COLON RESECTION;  Surgeon: Jamesetta So, MD;  Location:  [facility];  Service: General;  Laterality: N/A;  . COLONOSCOPY  07/20/2012   Dr. Gala Romney: multiple colonic polyps with large polyps on right side, s/p saline-assisted debulking piecemeal polypectomy and ablation. Not all removed. Path with tubulovillous adenomas, high grade dysplasia.   . COLONOSCOPY N/A 03/09/2013   [id number] polyps-tubular adenomas. S/p right hemicolectomy. next tcs 02/2018  . epidural steroids    . ESOPHAGOGASTRODUODENOSCOPY (EGD) WITH ESOPHAGEAL DILATION N/A 02/06/2014   Procedure: ESOPHAGOGASTRODUODENOSCOPY (EGD) WITH ESOPHAGEAL DILATION;  Surgeon: Daneil Dolin, MD;  Location: [facility];  Service: Endoscopy;  Laterality: N/A;  9:30  . OOPHORECTOMY  1976  . ORIF ANKLE FRACTURE   01/18/2012   Procedure: OPEN REDUCTION INTERNAL FIXATION (ORIF) ANKLE FRACTURE;  Surgeon: Arther Abbott, MD;  Location: [facility];  Service: Orthopedics;  Laterality: Left;  . ORIF ANKLE FRACTURE Right 01/13/2016   Procedure: OPEN REDUCTION INTERNAL FIXATION (ORIF) ANKLE FRACTURE;  Surgeon: Carole Civil, MD;  Location: [facility];  Service: Orthopedics;  Laterality: Right;   Family History  Problem Relation Age of Onset  . Stomach cancer Father   . Cancer Father   . Cancer Mother        brain   . Breast cancer Mother   . Arthritis Other    Social History   Socioeconomic History  . Marital status: Widowed    Spouse name: Not on file  . Number of children: 4  . Years of education: college 1  . Highest education level: Not on file  Occupational History  . Occupation: employed in the Pharmacist, hospital: RETIRED  Social Needs  . Financial resource strain: Not hard at all  . Food insecurity:    Worry: Never true    Inability: Never true  . Transportation needs:    Medical: No    Non-medical: No  Tobacco Use  . Smoking status: Former Smoker    Packs/day: 1.50    Years: 40.00    Pack years: 60.00    Types: Cigarettes    Last attempt to quit: 06/21/2006    Years since quitting: 12.6  . Smokeless tobacco: Never Used  Substance and Sexual Activity  . Alcohol use: No  . Drug use: No  . Sexual activity: Never  Lifestyle  . Physical activity:    Days per week: 7 days    Minutes per session: 30 min  . Stress: Not at all  Relationships  . Social connections:    Talks on phone: More than three times a week    Gets together: More than three times a week    Attends religious service: More than 4 times per year    Active member of club or organization: No    Attends meetings of clubs or organizations: Never    Relationship status: Divorced  Other Topics Concern  . Not on file  Social History Narrative   Lives at home alone   Right-handed   Drinks 2 or more cups of  coffee each morning    Outpatient Encounter Medications as of 02/27/2019  Medication Sig  . acetaminophen (TYLENOL) 650 MG CR tablet Take 650 mg by mouth every 8 (eight) hours as needed for pain.  [name] albuterol ([name] HFA;VENTOLIN HFA) 108 (90 Base) MCG/ACT inhaler Inhale 2 puffs into the lungs every 6 (six) hours as needed for wheezing or shortness of breath.  [name] alendronate (FOSAMAX) 70 MG tablet TAKE 1 TABLET ONE TIME A WEEK. TAKE WITH A FULL GLASS  OF WATER ON AN EMPTY STOMACH. (Patient taking differently: Take 70 mg by mouth every Sunday. )  . amLODipine (NORVASC) 2.5 MG tablet Take 1 tablet (2.5 mg total) by mouth daily.  [name] atorvastatin (LIPITOR) 10 MG tablet Take 1 tablet (10 mg total) by mouth daily.  . calcium-vitamin D (OSCAL WITH D) 500-200 MG-UNIT per tablet Take 1 tablet by mouth daily.   [name] donepezil (ARICEPT) 10 MG tablet TAKE 1 TABLET AT BEDTIME   . FLUoxetine (PROZAC) 20 MG capsule Take 1 capsule (20 mg total) by mouth daily.  [name] loratadine (CLARITIN) 10 MG tablet Take 10 mg by mouth daily as needed for allergies.   . Multiple Vitamins-Minerals (CEROVITE SENIOR) TABS Take 1 tablet by mouth daily.   . Olodaterol HCl 2.5 MCG/ACT AERS Take two inhalations once daily at the same time  . omeprazole (PRILOSEC) 40 MG capsule TAKE 1 CAPSULE EVERY DAY  . OXYGEN Inhale 2 L into the lungs continuous.  . verapamil (CALAN-SR) 240 MG CR tablet TAKE 1 TABLET AT BEDTIME  . [DISCONTINUED] atorvastatin (LIPITOR) 10 MG tablet TAKE 1 TABLET EVERY DAY  . [DISCONTINUED] ENSURE (ENSURE) Take 1 Can by mouth daily as needed. Take one by mouth with meals   . [DISCONTINUED] amLODipine (NORVASC) 2.5 MG tablet Take 1 tablet (2.5 mg total) by mouth daily.  . [DISCONTINUED] arformoterol (BROVANA) 15 MCG/2ML NEBU Take 2 mLs (15 mcg total) by nebulization 2 (two) times daily. ICD-10: J44.9   No facility-administered encounter medications on file as of 02/27/2019.     Activities of Daily Living In your  present state of health, do you have any difficulty performing the following activities: 02/27/2019  Hearing? N  Vision? N  Difficulty concentrating or making decisions? Y  Walking or climbing stairs? Y  Dressing or bathing? Y  Doing errands, shopping? Y  Preparing Food and eating ? Y  Comment can feed self  Using the Toilet? N  In the past six months, have you accidently leaked urine? N  Do you have problems with loss of bowel control? Y  Comment has had this in past off and on  Managing your Medications? Y  Managing your Finances? Y  Housekeeping or managing your Housekeeping? Y  Some recent data might be hidden    Patient Care Team: [name], MD as PCP - General Josue Hector, MD as Attending Physician (Cardiology) Kathrynn Ducking, MD as Consulting Physician (Neurology) Sinda Du, MD as Consulting Physician (Pulmonary Disease) Carole Civil, MD as Consulting Physician (Orthopedic Surgery) Lavera Guise, [facility] as [place] Management (Pharmacist) Adaline Sill, CPhT as [place] Management (Pharmacy Technician)    Assessment:   This is a routine wellness examination for [place].  Exercise Activities and Dietary recommendations Current Exercise Habits: The patient does not participate in regular exercise at present, Exercise limited by: cardiac condition(s);orthopedic condition(s);neurologic condition(s)  Goals    . Exercise 3x per week (30 min per time)     Starting 11/15/2016 patient would like to start chair exercises 3 times a week at 30 minutes a time.       Fall Risk Fall Risk  02/27/2019 12/30/2018 12/13/2018 04/11/2018 02/20/2018  Falls in the past year? 1 - 0 Yes Yes  Number falls in past yr: 1 1 - 2 or more 2 or more  Injury with Fall? 0 1 0 - Yes  Risk Factor Category  - - - - High Fall Risk  Risk for fall due to : Orthopedic patient;Impaired mobility History of fall(s);Impaired  balance/gait;Impaired mobility - - History of fall(s);Impaired balance/gait;Impaired mobility  Follow up - Falls evaluation completed;Education provided;Falls prevention discussed - - Falls prevention discussed   Is the patient's home free of loose throw rugs in walkways, pet beds, electrical cords, etc?   yes      Grab bars in the bathroom? yes      Handrails on the stairs?   yes      Adequate lighting?   yes  Timed Get Up and Go performed: wheelchair bound  Depression Screen PHQ 2/9 Scores 02/27/2019 12/13/2018 04/11/2018 02/20/2018  PHQ - 2 Score 0 0 0 0  PHQ- 9 Score - - - -     Cognitive Function MMSE - Mini Mental State Exam 03/10/2017 04/21/2016 10/21/2015 09/20/2014  Orientation to time 3 5 3 4   Orientation to Place 5 4 3 3   Registration 3 3 3 3   Attention/ Calculation 4 4 2 5   Recall 1 2 0 2  Language- name 2 objects 2 2 2 2   Language- repeat 1 1 1 1   Language- follow 3 step command 3 3 3 3   Language- read & follow direction 1 1 1 1   Write a sentence 1 1 1 1   Copy design 1 1 1 1   Total score 25 27 20 26      6CIT Screen 02/27/2019 02/20/2018 11/10/2016  What Year? 0 points 0 points 0 points  What month? 0 points 0 points 0 points  What time? 0 points 0 points 0 points  Count back from 20 4 points 0 points 0 points  Months in reverse 2 points 0 points 0 points  Repeat phrase 10 points 4 points 0 points  Total Score 16 4 0    Immunization History  Administered Date(s) Administered  . Influenza Split 08/25/2011, 07/25/2012  . Influenza Whole 09/12/2006, 08/25/2007, 08/01/2008, 08/20/2009, 08/20/2010  . Influenza, High Dose Seasonal PF 12/13/2018  . Influenza,inj,Quad PF,6+ Mos 08/21/2013, 09/04/2014, 08/13/2015, 07/08/2016, 09/23/2017  . Pneumococcal Conjugate-13 06/20/2014  . Pneumococcal Polysaccharide-23 09/12/2006, 08/20/2010, 04/16/2011  . Td 08/20/2010  . Zoster 12/26/2013    Qualifies for Shingles Vaccine? no  Screening Tests Health Maintenance  Topic Date Due   . COLONOSCOPY  03/09/2018  . INFLUENZA VACCINE  06/16/2019  . TETANUS/TDAP  08/20/2020  . DEXA SCAN  Completed  . PNA vac Low Risk Adult  Completed    Cancer Screenings: Lung: Low Dose CT Chest recommended if Age 78-80 years, 30 pack-year currently smoking OR have quit w/in 15years. Patient does  Qualify. (Family does not want) Breast:  Up to date on Mammogram? Yes   Up to date of Bone Density/Dexa? No Colorectal: needs to be schedule for this year  Additional Screenings:  Hepatitis C Screening: needs      Plan:    1. Encounter for Medicare annual wellness exam I have personally reviewed and noted the following in the patient's chart:   . Medical and social history . Use of alcohol, tobacco or illicit drugs  . Current medications and supplements . Functional ability and status . Nutritional status . Physical activity . Advanced directives . List of other physicians . Hospitalizations, surgeries, and ER visits in previous 12 months . Vitals . Screenings to include cognitive, depression, and falls . Referrals and appointments  In addition, I have reviewed and discussed with patient certain preventive protocols, quality metrics, and best practice recommendations. A written personalized care plan  for preventive services as well as general preventive health recommendations were provided to patient.    I provided 30 minutes of non-face-to-face time during this encounter.  Perlie Mayo, NP  02/27/2019

## 2019-02-27 NOTE — Telephone Encounter (Signed)
Refill sent.

## 2019-02-27 NOTE — Telephone Encounter (Signed)
Refill request received via fax for amlodipine 2.5 mg tablet for [place]

## 2019-03-05 ENCOUNTER — Other Ambulatory Visit: Payer: Self-pay | Admitting: Pharmacy Technician

## 2019-03-05 NOTE — Patient Outreach (Signed)
[facility]) Care Management  03/05/2019  CYAN CLIPPINGER 10/13/1941 [id number]    Follow up call placed to [employer] regarding patient assistance application(s) for [name] HFA , Ovid Curd confirms attestation form was received. Patient approved as of 4/9 until 11/15/19. Medication to arrive at patient home 7-10 business days.   Follow up  Will follow up with patient daughter [name] in 5-7 business days to confirm medication has been received.  Maud Deed Chana Bode [place] Certified Pharmacy Technician [place] Management Direct Dial:[phone]

## 2019-03-08 ENCOUNTER — Other Ambulatory Visit: Payer: Self-pay

## 2019-03-08 MED ORDER — AMLODIPINE BESYLATE 2.5 MG PO TABS: 2.5000 mg | ORAL_TABLET | Freq: Every day | ORAL | 1 refills | Status: DC

## 2019-03-13 DIAGNOSIS — J449 Chronic obstructive pulmonary disease, unspecified: Secondary | ICD-10-CM | POA: Diagnosis not present

## 2019-03-19 ENCOUNTER — Telehealth: Payer: Self-pay | Admitting: Family Medicine

## 2019-03-19 ENCOUNTER — Telehealth: Payer: Self-pay | Admitting: *Deleted

## 2019-03-19 ENCOUNTER — Other Ambulatory Visit: Payer: Self-pay

## 2019-03-19 MED ORDER — FLUOXETINE HCL 20 MG PO CAPS: 20.0000 mg | ORAL_CAPSULE | Freq: Every day | ORAL | 1 refills | Status: DC

## 2019-03-19 MED ORDER — FLUOXETINE HCL 20 MG PO CAPS: 20.0000 mg | ORAL_CAPSULE | Freq: Every day | ORAL | 0 refills | Status: DC

## 2019-03-19 NOTE — Telephone Encounter (Signed)
[name] (pts daughter)  Called stating Ms. Eriksson was out of her fluoxetine 20 mg. [facility] mail order pharmacy is sending out the medication but it will be a week before it comes in. She was needing a weeks worth sent to [facility] in [place] to hold her over until she could get the medication from [facility].

## 2019-03-19 NOTE — Telephone Encounter (Signed)
Patient called and LMOM over the weekend requesting a refill called into [facility] for FLUoxetine (PROZAC) 20 MG capsule

## 2019-03-19 NOTE — Telephone Encounter (Signed)
10 day supply of medication sent to [facility] in [place]

## 2019-03-19 NOTE — Telephone Encounter (Signed)
Medication refilled

## 2019-03-23 ENCOUNTER — Other Ambulatory Visit: Payer: Self-pay | Admitting: Pharmacy Technician

## 2019-03-23 NOTE — Patient Outreach (Signed)
[name]) Care Management  03/23/2019  [name] 1941/04/19 [id number]    Successful call placed to patients daughter, [name] regarding patient assistance medication receipt from [employer], HIPAA identifiers verified. [name] confirms that patient received her [name] HFA from [employer].  Reviewed with her how to obtain refills and requested she contact me if she runs into any issues.  Follow up:  Will route note to [place] for case closure.  Maud Deed Chana Bode [place] Certified Pharmacy Technician [place] Management Direct Dial:[phone]

## 2019-03-26 ENCOUNTER — Other Ambulatory Visit: Payer: Self-pay | Admitting: Pharmacist

## 2019-03-26 NOTE — Patient Outreach (Addendum)
[facility]) Care Management [place]  03/26/2019  [name] Jun 12, 1941 [id number]  Reason for referral: medication assistance  [facility] pharmacy case is being closed due to the following reasons:  Goals have been met.  Spoke with patient's daughter, [name].  She states that patient has medication and they are able to use inhalers properly.  Encouraged her to reach out to [place] if there are any questions regarding medications.  She is appreciative of efforts.  Striverdi and Albuterol have been obtained free of charge to the patient until the end of the year.    Patient has been provided [facility] CM contact information if assistance needed in the future.    Thank you for allowing [facility] pharmacy to be involved in this patient's care.    Regina Eck, PharmD, [place] [phone]

## 2019-04-11 DIAGNOSIS — J449 Chronic obstructive pulmonary disease, unspecified: Secondary | ICD-10-CM | POA: Diagnosis not present

## 2019-04-16 ENCOUNTER — Ambulatory Visit: Payer: Medicare PPO | Admitting: Family Medicine

## 2019-05-12 DIAGNOSIS — J449 Chronic obstructive pulmonary disease, unspecified: Secondary | ICD-10-CM | POA: Diagnosis not present

## 2019-06-07 ENCOUNTER — Other Ambulatory Visit: Payer: Self-pay | Admitting: Family Medicine

## 2019-06-11 DIAGNOSIS — J449 Chronic obstructive pulmonary disease, unspecified: Secondary | ICD-10-CM | POA: Diagnosis not present

## 2019-07-10 ENCOUNTER — Ambulatory Visit (INDEPENDENT_AMBULATORY_CARE_PROVIDER_SITE_OTHER): Payer: Medicare PPO | Admitting: Family Medicine

## 2019-07-10 ENCOUNTER — Encounter: Payer: Self-pay | Admitting: Family Medicine

## 2019-07-10 ENCOUNTER — Other Ambulatory Visit: Payer: Self-pay

## 2019-07-10 VITALS — BP 124/69 | HR 96 | Resp 14 | Ht 73.0 in | Wt 235.0 lb

## 2019-07-10 DIAGNOSIS — R399 Unspecified symptoms and signs involving the genitourinary system: Secondary | ICD-10-CM | POA: Diagnosis not present

## 2019-07-10 DIAGNOSIS — E559 Vitamin D deficiency, unspecified: Secondary | ICD-10-CM

## 2019-07-10 DIAGNOSIS — R0902 Hypoxemia: Secondary | ICD-10-CM

## 2019-07-10 DIAGNOSIS — Z9181 History of falling: Secondary | ICD-10-CM

## 2019-07-10 DIAGNOSIS — M5090 Cervical disc disorder, unspecified, unspecified cervical region: Secondary | ICD-10-CM | POA: Diagnosis not present

## 2019-07-10 DIAGNOSIS — R296 Repeated falls: Secondary | ICD-10-CM

## 2019-07-10 DIAGNOSIS — I10 Essential (primary) hypertension: Secondary | ICD-10-CM | POA: Diagnosis not present

## 2019-07-10 DIAGNOSIS — R269 Unspecified abnormalities of gait and mobility: Secondary | ICD-10-CM

## 2019-07-10 DIAGNOSIS — Z23 Encounter for immunization: Secondary | ICD-10-CM | POA: Diagnosis not present

## 2019-07-10 DIAGNOSIS — J449 Chronic obstructive pulmonary disease, unspecified: Secondary | ICD-10-CM | POA: Diagnosis not present

## 2019-07-10 LAB — POCT URINALYSIS DIP (CLINITEK)
Bilirubin, UA: NEGATIVE
Blood, UA: NEGATIVE
Glucose, UA: NEGATIVE mg/dL
Nitrite, UA: NEGATIVE
POC PROTEIN,UA: 100 — AB
Spec Grav, UA: 1.025 (ref 1.010–1.025)
Urobilinogen, UA: 0.2 E.U./dL
pH, UA: 5.5 (ref 5.0–8.0)

## 2019-07-10 NOTE — Patient Instructions (Signed)
Annual physical exam with mD in office in Jan 2021, call if you need me sooner  Flu vaccine today  Urine is being sent for culture since you have symptoms and it is not entirely normal  Labs today, CBC, cmp and eGFR, tSH  And vit d  You will be referred to in home physical therapy twice weekly due to repeated falls ( over 4 per month reported)   Please reduce cookies, increase chicken, fish , vegetables and fruit  Thanks for choosing [facility], we consider it a privelige to serve you.

## 2019-07-11 ENCOUNTER — Other Ambulatory Visit (HOSPITAL_COMMUNITY)
Admission: RE | Admit: 2019-07-11 | Discharge: 2019-07-11 | Disposition: A | Discharge status: Home / Self Care | Payer: Medicare PPO | Source: Ambulatory Visit | Attending: Family Medicine | Admitting: Family Medicine

## 2019-07-11 DIAGNOSIS — R399 Unspecified symptoms and signs involving the genitourinary system: Secondary | ICD-10-CM | POA: Diagnosis not present

## 2019-07-11 LAB — COMPLETE METABOLIC PANEL WITH GFR
AG Ratio: 1.7 (calc) (ref 1.0–2.5)
ALT: 11 U/L (ref 6–29)
AST: 16 U/L (ref 10–35)
Albumin: 4 g/dL (ref 3.6–5.1)
Alkaline phosphatase (APISO): 69 U/L (ref 37–153)
BUN/Creatinine Ratio: 16 (calc) (ref 6–22)
BUN: 15 mg/dL (ref 7–25)
CO2: 33 mmol/L — ABNORMAL HIGH (ref 20–32)
Calcium: 9.3 mg/dL (ref 8.6–10.4)
Chloride: 105 mmol/L (ref 98–110)
Creat: 0.95 mg/dL — ABNORMAL HIGH (ref 0.60–0.93)
GFR, Est African American: 67 mL/min/{1.73_m2} (ref >=60)
GFR, Est Non African American: 58 mL/min/{1.73_m2} — ABNORMAL LOW (ref >=60)
Globulin: 2.4 g/dL (calc) (ref 1.9–3.7)
Glucose, Bld: 106 mg/dL (ref 65–139)
Potassium: 3.5 mmol/L (ref 3.5–5.3)
Sodium: 146 mmol/L (ref 135–146)
Total Bilirubin: 0.3 mg/dL (ref 0.2–1.2)
Total Protein: 6.4 g/dL (ref 6.1–8.1)

## 2019-07-11 LAB — CBC
HCT: 34.5 % — ABNORMAL LOW (ref 35.0–45.0)
Hemoglobin: 10.5 g/dL — ABNORMAL LOW (ref 11.7–15.5)
MCH: 25.2 pg — ABNORMAL LOW (ref 27.0–33.0)
MCHC: 30.4 g/dL — ABNORMAL LOW (ref 32.0–36.0)
MCV: 82.9 fL (ref 80.0–100.0)
MPV: 10.1 fL (ref 7.5–12.5)
Platelets: 259 10*3/uL (ref 140–400)
RBC: 4.16 10*6/uL (ref 3.80–5.10)
RDW: 15.2 % — ABNORMAL HIGH (ref 11.0–15.0)
WBC: 4.7 10*3/uL (ref 3.8–10.8)

## 2019-07-11 LAB — TSH: TSH: 0.55 mIU/L (ref 0.40–4.50)

## 2019-07-11 LAB — VITAMIN D 25 HYDROXY (VIT D DEFICIENCY, FRACTURES): Vit D, 25-Hydroxy: 30 ng/mL (ref 30–100)

## 2019-07-12 DIAGNOSIS — J449 Chronic obstructive pulmonary disease, unspecified: Secondary | ICD-10-CM | POA: Diagnosis not present

## 2019-07-12 LAB — URINE CULTURE

## 2019-07-14 ENCOUNTER — Encounter: Payer: Self-pay | Admitting: Family Medicine

## 2019-07-14 DIAGNOSIS — R399 Unspecified symptoms and signs involving the genitourinary system: Secondary | ICD-10-CM | POA: Insufficient documentation

## 2019-07-14 NOTE — Assessment & Plan Note (Signed)
After obtaining informed consent, the vaccine is  administered , with no adverse effect noted at the time of administration.  

## 2019-07-14 NOTE — Assessment & Plan Note (Signed)
Controlled, no change in medication DASH diet and commitment to daily physical activity for a minimum of 30 minutes discussed and encouraged, as a part of hypertension management. The importance of attaining a healthy weight is also discussed.  BP/Weight 07/10/2019 12/13/2018 09/06/2018 07/19/2018 07/13/2018 07/12/2018 [id number]  Systolic BP [id number] [id number] Q000111Q 123456 123456 Q000111Q [id number]  Diastolic BP 69 58 77 61 89 102 90  Wt. (Lbs) 235 215.04 220 220 - - 221  BMI 31 28.37 29.84 29.84 - - 29.16

## 2019-07-14 NOTE — Assessment & Plan Note (Signed)
Maintained on supplemental oxygen

## 2019-07-14 NOTE — Assessment & Plan Note (Signed)
Report of 4 falls/ month over past several months, refer for in home PT, fall risk and home safety discussed at visit also

## 2019-07-14 NOTE — Assessment & Plan Note (Signed)
Abnormal CCUA and symptomatic, will f/u c/s, no antibiotic at this time

## 2019-07-14 NOTE — Progress Notes (Signed)
   [name]     MRN: [id number]      DOB: 26-Nov-1940   HPI [name] is here for follow up and re-evaluation of chronic medical conditions, medication management and review of any available recent lab and radiology data.  Preventive health is updated, specifically  Cancer screening and Immunization.    The PT denies any adverse reactions to current medications since the last visit.  Daughter reports a fall in the past 2 weeks, and over 4 /months, unsteady on her feet and weakness, has benefited from PT in the past and is requesting same  C/o malodorous urine and frequency in apst 5 days, denies fever, chills or flank pain Daughter c/o poor appetite, only cookies, however weight is stable over the past 1 year ROS Denies recent fever or chills. Denies sinus pressure, nasal congestion, ear pain or sore throat. Denies chest congestion, productive cough or wheezing. Denies chest pains, palpitations and leg swelling Denies abdominal pain, nausea, vomiting,diarrhea or constipation.    Denies headaches, seizures, numbness, or tingling. Denies depression, anxiety or insomnia. Denies skin break down or rash.   PE  BP 124/69   Pulse 96   Resp 14   Ht 6\' 1"  (1.854 m)   Wt 235 lb (106.6 kg)   SpO2 91% Comment: with 2.5lpm supplemental oxygen  BMI 31.00 kg/m   Patient alert and oriented and in no cardiopulmonary distress.at rest on supplemental oxygen  HEENT: No facial asymmetry, EOMI,   oropharynx pink and moist.  Neck decreased ROM, no JVD, no mass.  Chest: Clear to auscultation bilaterally.Decreased air entry throughout  CVS: S1, S2 no murmurs, no S3.Regular rate.  ABD: Soft non tender.   Ext: No edema  MS: Markedly reduced  ROM spine, shoulders, hips and knees.  Skin: Intact, no ulcerations or rash noted.  Psych: Good eye contact, normal affect. Memory mildly impaired not anxious or depressed appearing.  CNS: CN 2-12 intact, power,  normal throughout.no focal  deficits noted.   Assessment & Plan  Abnormality of gait Severe generalized osteoarthritis with recurrent falls, over 4/ month, refer to in home PT twice weekly for 4 weeks  Need for immunization against influenza After obtaining informed consent, the vaccine is  administered , with no adverse effect noted at the time of administration.   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 The importance of attaining a healthy weight is also discussed.  BP/Weight 07/10/2019 12/13/2018 09/06/2018 07/19/2018 07/13/2018 07/12/2018 [id number]  Systolic BP A999333 [id number] Q000111Q 123456 123456 Q000111Q [id number]  Diastolic BP 69 58 77 61 89 102 90  Wt. (Lbs) 235 215.04 220 220 - - 221  BMI 31 28.37 29.84 29.84 - - 29.16       UTI symptoms Abnormal CCUA and symptomatic, will f/u c/s, no antibiotic at this time

## 2019-07-14 NOTE — Assessment & Plan Note (Signed)
Severe generalized osteoarthritis with recurrent falls, over 4/ month, refer to in home PT twice weekly for 4 weeks

## 2019-07-16 ENCOUNTER — Other Ambulatory Visit: Payer: Self-pay | Admitting: Family Medicine

## 2019-07-16 DIAGNOSIS — R413 Other amnesia: Secondary | ICD-10-CM

## 2019-07-18 ENCOUNTER — Other Ambulatory Visit: Payer: Self-pay | Admitting: Family Medicine

## 2019-08-03 ENCOUNTER — Other Ambulatory Visit: Payer: Self-pay | Admitting: Family Medicine

## 2019-08-09 ENCOUNTER — Other Ambulatory Visit (HOSPITAL_COMMUNITY): Payer: Self-pay | Admitting: Family Medicine

## 2019-08-09 DIAGNOSIS — Z1231 Encounter for screening mammogram for malignant neoplasm of breast: Secondary | ICD-10-CM

## 2019-08-12 DIAGNOSIS — J449 Chronic obstructive pulmonary disease, unspecified: Secondary | ICD-10-CM | POA: Diagnosis not present

## 2019-08-13 ENCOUNTER — Other Ambulatory Visit: Payer: Self-pay | Admitting: Family Medicine

## 2019-09-11 DIAGNOSIS — J449 Chronic obstructive pulmonary disease, unspecified: Secondary | ICD-10-CM | POA: Diagnosis not present

## 2019-09-25 ENCOUNTER — Ambulatory Visit: Payer: Medicare PPO

## 2019-10-05 ENCOUNTER — Other Ambulatory Visit: Payer: Self-pay | Admitting: Family Medicine

## 2019-10-09 ENCOUNTER — Telehealth: Payer: Self-pay

## 2019-10-09 ENCOUNTER — Other Ambulatory Visit: Payer: Self-pay

## 2019-10-09 NOTE — Telephone Encounter (Signed)
Inhaler patient assistance form filled out

## 2019-10-10 ENCOUNTER — Other Ambulatory Visit: Payer: Self-pay

## 2019-10-10 MED ORDER — ALENDRONATE SODIUM 70 MG PO TABS: ORAL_TABLET | ORAL | 1 refills | Status: DC

## 2019-10-12 DIAGNOSIS — J449 Chronic obstructive pulmonary disease, unspecified: Secondary | ICD-10-CM | POA: Diagnosis not present

## 2019-10-31 ENCOUNTER — Ambulatory Visit: Payer: Medicare PPO

## 2019-11-11 DIAGNOSIS — J449 Chronic obstructive pulmonary disease, unspecified: Secondary | ICD-10-CM | POA: Diagnosis not present

## 2019-11-23 ENCOUNTER — Ambulatory Visit: Payer: Medicare PPO | Attending: Internal Medicine

## 2019-11-23 ENCOUNTER — Other Ambulatory Visit: Payer: Self-pay

## 2019-11-23 DIAGNOSIS — Z20822 Contact with and (suspected) exposure to covid-19: Secondary | ICD-10-CM

## 2019-11-25 ENCOUNTER — Telehealth: Payer: Self-pay

## 2019-11-25 LAB — NOVEL CORONAVIRUS, NAA: SARS-CoV-2, NAA: NOT DETECTED

## 2019-11-25 NOTE — Telephone Encounter (Signed)
Called and informed patient that test for Covid 19 was NEGATIVE. 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

## 2019-11-27 ENCOUNTER — Encounter: Payer: Medicare PPO | Admitting: Family Medicine

## 2019-12-07 ENCOUNTER — Other Ambulatory Visit: Payer: Self-pay | Admitting: Family Medicine

## 2019-12-07 DIAGNOSIS — R413 Other amnesia: Secondary | ICD-10-CM

## 2019-12-12 DIAGNOSIS — J449 Chronic obstructive pulmonary disease, unspecified: Secondary | ICD-10-CM | POA: Diagnosis not present

## 2019-12-18 ENCOUNTER — Encounter: Payer: Self-pay | Admitting: Family Medicine

## 2019-12-18 ENCOUNTER — Encounter: Payer: Medicare PPO | Admitting: Family Medicine

## 2019-12-18 ENCOUNTER — Other Ambulatory Visit: Payer: Self-pay

## 2019-12-18 ENCOUNTER — Ambulatory Visit (INDEPENDENT_AMBULATORY_CARE_PROVIDER_SITE_OTHER): Payer: Medicare PPO | Admitting: Family Medicine

## 2019-12-18 VITALS — BP 128/76 | HR 78 | Temp 98.1°F | Resp 15 | Ht 73.0 in | Wt 186.8 lb

## 2019-12-18 DIAGNOSIS — E559 Vitamin D deficiency, unspecified: Secondary | ICD-10-CM

## 2019-12-18 DIAGNOSIS — I10 Essential (primary) hypertension: Secondary | ICD-10-CM | POA: Diagnosis not present

## 2019-12-18 DIAGNOSIS — E785 Hyperlipidemia, unspecified: Secondary | ICD-10-CM | POA: Diagnosis not present

## 2019-12-18 DIAGNOSIS — Z0000 Encounter for general adult medical examination without abnormal findings: Secondary | ICD-10-CM

## 2019-12-18 NOTE — Patient Instructions (Addendum)
F/U in office with mD end August, call if you need me before   lipid, cmp and EGFr, cBC and vit D today  Thankful you are doing well, no falls please  Thanks for choosing [facility], we consider it a privelige to serve you.

## 2019-12-19 ENCOUNTER — Encounter: Payer: Self-pay | Admitting: Family Medicine

## 2019-12-19 ENCOUNTER — Ambulatory Visit: Payer: Medicare PPO

## 2019-12-19 ENCOUNTER — Telehealth: Payer: Self-pay | Admitting: *Deleted

## 2019-12-19 LAB — COMPLETE METABOLIC PANEL WITH GFR
AG Ratio: 1.5 (calc) (ref 1.0–2.5)
ALT: 12 U/L (ref 6–29)
AST: 20 U/L (ref 10–35)
Albumin: 3.8 g/dL (ref 3.6–5.1)
Alkaline phosphatase (APISO): 81 U/L (ref 37–153)
BUN/Creatinine Ratio: 15 (calc) (ref 6–22)
BUN: 15 mg/dL (ref 7–25)
CO2: 31 mmol/L (ref 20–32)
Calcium: 9.4 mg/dL (ref 8.6–10.4)
Chloride: 101 mmol/L (ref 98–110)
Creat: 1.01 mg/dL — ABNORMAL HIGH (ref 0.60–0.93)
GFR, Est African American: 62 mL/min/{1.73_m2} (ref >=60)
GFR, Est Non African American: 53 mL/min/{1.73_m2} — ABNORMAL LOW (ref >=60)
Globulin: 2.6 g/dL (calc) (ref 1.9–3.7)
Glucose, Bld: 104 mg/dL (ref 65–139)
Potassium: 4.1 mmol/L (ref 3.5–5.3)
Sodium: 141 mmol/L (ref 135–146)
Total Bilirubin: 0.3 mg/dL (ref 0.2–1.2)
Total Protein: 6.4 g/dL (ref 6.1–8.1)

## 2019-12-19 LAB — CBC
HCT: 37.9 % (ref 35.0–45.0)
Hemoglobin: 11.7 g/dL (ref 11.7–15.5)
MCH: 25.7 pg — ABNORMAL LOW (ref 27.0–33.0)
MCHC: 30.9 g/dL — ABNORMAL LOW (ref 32.0–36.0)
MCV: 83.3 fL (ref 80.0–100.0)
MPV: 10 fL (ref 7.5–12.5)
Platelets: 316 10*3/uL (ref 140–400)
RBC: 4.55 10*6/uL (ref 3.80–5.10)
RDW: 16 % — ABNORMAL HIGH (ref 11.0–15.0)
WBC: 6.1 10*3/uL (ref 3.8–10.8)

## 2019-12-19 LAB — LIPID PANEL
Cholesterol: 168 mg/dL (ref <200)
HDL: 70 mg/dL (ref >=50)
LDL Cholesterol (Calc): 75 mg/dL (calc)
Non-HDL Cholesterol (Calc): 98 mg/dL (calc) (ref <130)
Total CHOL/HDL Ratio: 2.4 (calc) (ref <5.0)
Triglycerides: 144 mg/dL (ref <150)

## 2019-12-19 LAB — VITAMIN D 25 HYDROXY (VIT D DEFICIENCY, FRACTURES): Vit D, 25-Hydroxy: 39 ng/mL (ref 30–100)

## 2019-12-19 NOTE — Telephone Encounter (Signed)
Spoke with pt daughter [place] with results verbalized understanding

## 2019-12-19 NOTE — Telephone Encounter (Signed)
-----   Message from [name], MD sent at 12/19/2019 12:15 PM EST ----- Pls let her know labs are eXCELLENT, no med changes, keep up the great worrk!

## 2019-12-19 NOTE — Progress Notes (Signed)
    [name]     MRN: [id number]      DOB: 11/21/1940  HPI: Patient is in for annual physical exam.    PE: BP 128/76   Pulse 78   Temp 98.1 F (36.7 C) (Temporal)   Resp 15   Ht 6\' 1"  (1.854 m)   Wt 186 lb 12.8 oz (84.7 kg)   BMI 24.65 kg/m   Pleasant  female, alert and oriented x 3, in no cardio-pulmonary distress. Afebrile. HEENT No facial trauma or asymetry. Sinuses non tender.  Extra occullar muscles intact.. External ears normal, . Neck: decreased ROM, no adenopathy,JVD or thyromegaly.No bruits.  Chest: Clear to ascultation bilaterally.No crackles or wheezes. Non tender to palpation   Cardiovascular system; Heart sounds normal,  S1 and  S2 ,no S3.  No murmur, or thrill. Apical beat not displaced Peripheral pulses normal.  Abdomen: Soft, non tender,  No guarding, tenderness or rebound.     Musculoskeletal exam: Decreased  ROM of spine, hips , shoulders and knees.  deformity ,swelling and  crepitus noted. No muscle wasting or atrophy.   Neurologic: Cranial nerves 2 to 12 intact. Power, tone ,sensation  normal throughout.  disturbance in gait. Fine  tremor.  Skin: Intact, no ulceration, erythema , scaling or rash noted. Pigmentation normal throughout  Psych; Normal mood and affect. Judgement and concentration normal   Assessment & Plan:  Annual physical exam Annual exam as documented. Counseling done  re healthy lifestyle involving commitment  heart healthy diet, and attaining healthy weight.The importance of adequate sleep also discussed. home safety, is a discussed. . Immunization needs are specifically addressed at this visit.

## 2019-12-19 NOTE — Assessment & Plan Note (Signed)
Annual exam as documented. Counseling done  re healthy lifestyle involving commitment  heart healthy diet, and attaining healthy weight.The importance of adequate sleep also discussed. home safety, is a discussed. . Immunization needs are specifically addressed at this visit.

## 2019-12-26 ENCOUNTER — Other Ambulatory Visit: Payer: Self-pay

## 2019-12-26 MED ORDER — VERAPAMIL HCL ER 240 MG PO TBCR: 240.0000 mg | EXTENDED_RELEASE_TABLET | Freq: Every day | ORAL | 1 refills | Status: DC

## 2020-01-03 ENCOUNTER — Ambulatory Visit: Payer: Medicare PPO

## 2020-01-12 DIAGNOSIS — J449 Chronic obstructive pulmonary disease, unspecified: Secondary | ICD-10-CM | POA: Diagnosis not present

## 2020-01-17 ENCOUNTER — Other Ambulatory Visit: Payer: Self-pay | Admitting: Family Medicine

## 2020-02-07 ENCOUNTER — Ambulatory Visit
Admission: RE | Admit: 2020-02-07 | Discharge: 2020-02-07 | Disposition: A | Discharge status: Home / Self Care | Payer: Medicare PPO | Source: Ambulatory Visit | Attending: Family Medicine | Admitting: Family Medicine

## 2020-02-07 ENCOUNTER — Other Ambulatory Visit: Payer: Self-pay

## 2020-02-07 DIAGNOSIS — Z1231 Encounter for screening mammogram for malignant neoplasm of breast: Secondary | ICD-10-CM

## 2020-02-09 DIAGNOSIS — J449 Chronic obstructive pulmonary disease, unspecified: Secondary | ICD-10-CM | POA: Diagnosis not present

## 2020-02-18 ENCOUNTER — Other Ambulatory Visit: Payer: Self-pay | Admitting: Family Medicine

## 2020-02-20 ENCOUNTER — Other Ambulatory Visit: Payer: Self-pay | Admitting: Family Medicine

## 2020-03-11 DIAGNOSIS — J449 Chronic obstructive pulmonary disease, unspecified: Secondary | ICD-10-CM | POA: Diagnosis not present

## 2020-03-26 ENCOUNTER — Other Ambulatory Visit: Payer: Self-pay | Admitting: Family Medicine

## 2020-04-10 DIAGNOSIS — J449 Chronic obstructive pulmonary disease, unspecified: Secondary | ICD-10-CM | POA: Diagnosis not present

## 2020-04-23 ENCOUNTER — Other Ambulatory Visit: Payer: Self-pay | Admitting: Family Medicine

## 2020-04-23 DIAGNOSIS — R413 Other amnesia: Secondary | ICD-10-CM

## 2020-05-11 DIAGNOSIS — J449 Chronic obstructive pulmonary disease, unspecified: Secondary | ICD-10-CM | POA: Diagnosis not present

## 2020-06-10 DIAGNOSIS — J449 Chronic obstructive pulmonary disease, unspecified: Secondary | ICD-10-CM | POA: Diagnosis not present

## 2020-06-11 ENCOUNTER — Other Ambulatory Visit: Payer: Self-pay | Admitting: Family Medicine

## 2020-07-08 ENCOUNTER — Ambulatory Visit: Payer: Medicare PPO | Admitting: Family Medicine

## 2020-07-11 DIAGNOSIS — J449 Chronic obstructive pulmonary disease, unspecified: Secondary | ICD-10-CM | POA: Diagnosis not present

## 2020-07-14 ENCOUNTER — Ambulatory Visit (INDEPENDENT_AMBULATORY_CARE_PROVIDER_SITE_OTHER): Payer: Medicare PPO | Admitting: Family Medicine

## 2020-07-14 ENCOUNTER — Other Ambulatory Visit (HOSPITAL_COMMUNITY): Payer: Self-pay | Admitting: Family Medicine

## 2020-07-14 ENCOUNTER — Other Ambulatory Visit: Payer: Self-pay

## 2020-07-14 ENCOUNTER — Other Ambulatory Visit: Payer: Self-pay | Admitting: *Deleted

## 2020-07-14 ENCOUNTER — Encounter: Payer: Self-pay | Admitting: Family Medicine

## 2020-07-14 VITALS — BP 137/88 | HR 84 | Temp 97.8°F | Resp 18 | Ht 73.0 in | Wt 194.0 lb

## 2020-07-14 DIAGNOSIS — J449 Chronic obstructive pulmonary disease, unspecified: Secondary | ICD-10-CM

## 2020-07-14 DIAGNOSIS — I10 Essential (primary) hypertension: Secondary | ICD-10-CM

## 2020-07-14 DIAGNOSIS — R0902 Hypoxemia: Secondary | ICD-10-CM

## 2020-07-14 DIAGNOSIS — E785 Hyperlipidemia, unspecified: Secondary | ICD-10-CM | POA: Diagnosis not present

## 2020-07-14 DIAGNOSIS — Z780 Asymptomatic menopausal state: Secondary | ICD-10-CM | POA: Diagnosis not present

## 2020-07-14 DIAGNOSIS — M159 Polyosteoarthritis, unspecified: Secondary | ICD-10-CM

## 2020-07-14 DIAGNOSIS — Z9181 History of falling: Secondary | ICD-10-CM

## 2020-07-14 DIAGNOSIS — R7989 Other specified abnormal findings of blood chemistry: Secondary | ICD-10-CM

## 2020-07-14 DIAGNOSIS — D369 Benign neoplasm, unspecified site: Secondary | ICD-10-CM

## 2020-07-14 MED ORDER — ALBUTEROL SULFATE HFA 108 (90 BASE) MCG/ACT IN AERS: 2.0000 | INHALATION_SPRAY | Freq: Four times a day (QID) | RESPIRATORY_TRACT | 3 refills | Status: DC | PRN

## 2020-07-14 MED ORDER — OLODATEROL HCL 2.5 MCG/ACT IN AERS: INHALATION_SPRAY | RESPIRATORY_TRACT | 3 refills | Status: DC

## 2020-07-14 NOTE — Patient Instructions (Addendum)
Annual physical exam in office with MD in February, call if you need me before  Schedule flu vaccine at checkout  Please schedule dexa at checkout  Labs today, lipid, cmp and eGFR, tSH  Careful, no more falls!  No medication changes  Call when you decide to get the colonoscopy, I recommend this  Thanks for choosing [facility], we consider it a privelige to serve you.

## 2020-07-15 LAB — CMP14+EGFR
ALT: 12 IU/L (ref 0–32)
AST: 15 IU/L (ref 0–40)
Albumin/Globulin Ratio: 1.4 (ref 1.2–2.2)
Albumin: 3.6 g/dL — ABNORMAL LOW (ref 3.7–4.7)
Alkaline Phosphatase: 97 IU/L (ref 48–121)
BUN/Creatinine Ratio: 22 (ref 12–28)
BUN: 18 mg/dL (ref 8–27)
Bilirubin Total: 0.3 mg/dL (ref 0.0–1.2)
CO2: 27 mmol/L (ref 20–29)
Calcium: 9.4 mg/dL (ref 8.7–10.3)
Chloride: 105 mmol/L (ref 96–106)
Creatinine, Ser: 0.81 mg/dL (ref 0.57–1.00)
GFR calc Af Amer: 80 mL/min/{1.73_m2} (ref >59)
GFR calc non Af Amer: 70 mL/min/{1.73_m2} (ref >59)
Globulin, Total: 2.6 g/dL (ref 1.5–4.5)
Glucose: 82 mg/dL (ref 65–99)
Potassium: 4 mmol/L (ref 3.5–5.2)
Sodium: 144 mmol/L (ref 134–144)
Total Protein: 6.2 g/dL (ref 6.0–8.5)

## 2020-07-15 LAB — LIPID PANEL
Chol/HDL Ratio: 2.2 ratio (ref 0.0–4.4)
Cholesterol, Total: 135 mg/dL (ref 100–199)
HDL: 62 mg/dL (ref >39)
LDL Chol Calc (NIH): 55 mg/dL (ref 0–99)
Triglycerides: 99 mg/dL (ref 0–149)
VLDL Cholesterol Cal: 18 mg/dL (ref 5–40)

## 2020-07-15 LAB — TSH: TSH: 0.336 u[IU]/mL — ABNORMAL LOW (ref 0.450–4.500)

## 2020-07-16 ENCOUNTER — Encounter: Payer: Self-pay | Admitting: Family Medicine

## 2020-07-16 NOTE — Assessment & Plan Note (Signed)
Home exercises recommended and use of tylenol as needed

## 2020-07-16 NOTE — Progress Notes (Signed)
   [name]     MRN: [id number]      DOB: 03-01-1941   HPI [name] is here for follow up and re-evaluation of chronic medical conditions, medication management and review of any available recent lab and radiology data.  Preventive health is updated, specifically  Cancer screening and Immunization.    The PT denies any adverse reactions to current medications since the last visit.  There are no new concerns.  There are no specific complaints , slid recently when trying to throw a towel over a door, no injuries, no other falls  ROS Denies recent fever or chills. Denies sinus pressure, nasal congestion, ear pain or sore throat. Denies chest congestion, productive cough or wheezing. Denies chest pains, palpitations and leg swelling Denies abdominal pain, nausea, vomiting,diarrhea or constipation.   Denies dysuria, frequency, hesitancy or incontinence. Denies uncontrolled joint pain, swelling and limitation in mobility. Denies headaches, seizures, numbness, or tingling. Denies depression, anxiety or insomnia. Denies skin break down or rash.   PE  BP 137/88 (BP Location: Left Arm, Patient Position: Sitting, Cuff Size: Normal)   Pulse 84   Temp 97.8 F (36.6 C) (Oral)   Resp 18   Ht 6\' 1"  (1.854 m)   Wt 194 lb (88 kg)   SpO2 91%   BMI 25.60 kg/m   Patient alert and oriented and in no cardiopulmonary distress.requires supplemental oxygen   HEENT: No facial asymmetry, EOMI,     Neck supple .  Chest: Clear to auscultation bilaterally.Decreased air entry  CVS: S1, S2 no murmurs, no S3.Regular rate.  ABD: Soft non tender.   Ext: No edema  MS: Adequate though reduced  ROM spine, shoulders, hips and knees.  Skin: Intact, no ulcerations or rash noted.  Psych: Good eye contact, normal affect. Memory intact not anxious or depressed appearing.  CNS: CN 2-12 intact, power,  normal throughout.no focal deficits noted.   Assessment & Plan  Essential  hypertension Controlled, no change in medication DASH diet and commitment to daily physical activity for a minimum of 30 minutes discussed and encouraged, as a part of hypertension management. The importance of attaining a healthy weight is also discussed.  BP/Weight 07/14/2020 12/18/2019 07/10/2019 12/13/2018 09/06/2018 07/19/2018 01/20/6577  Systolic BP 469 629 528 413 244 010 272  Diastolic BP 88 76 69 58 77 61 89  Wt. (Lbs) 194 186.8 235 215.04 220 220 -  BMI 25.6 24.65 31 28.37 29.84 29.84 -       Hyperlipidemia LDL goal <100 Hyperlipidemia:Low fat diet discussed and encouraged.   Lipid Panel  Lab Results  Component Value Date   CHOL 135 07/14/2020   HDL 62 07/14/2020   LDLCALC 55 07/14/2020   TRIG 99 07/14/2020   CHOLHDL 2.2 07/14/2020   Controlled, no change in medication     Multiple adenomatous polyps Repeat colonoscopy recommended at visit, daughter will consider, this is past due  At high risk for falls Home safety and fall reduction discussed  Generalized osteoarthritis of multiple sites Home exercises recommended and use of tylenol as needed  COPD with hypoxia (HCC) Stable and controlled on current medication

## 2020-07-16 NOTE — Assessment & Plan Note (Signed)
Hyperlipidemia:Low fat diet discussed and encouraged.   Lipid Panel  Lab Results  Component Value Date   CHOL 135 07/14/2020   HDL 62 07/14/2020   LDLCALC 55 07/14/2020   TRIG 99 07/14/2020   CHOLHDL 2.2 07/14/2020   Controlled, no change in medication

## 2020-07-16 NOTE — Assessment & Plan Note (Signed)
Repeat colonoscopy recommended at visit, daughter will consider, this is past due

## 2020-07-16 NOTE — Assessment & Plan Note (Signed)
Home safety and fall reduction discussed

## 2020-07-16 NOTE — Assessment & Plan Note (Signed)
Stable and controlled on current medication 

## 2020-07-16 NOTE — Assessment & Plan Note (Signed)
Controlled, no change in medication DASH diet and commitment to daily physical activity for a minimum of 30 minutes discussed and encouraged, as a part of hypertension management. The importance of attaining a healthy weight is also discussed.  BP/Weight 07/14/2020 12/18/2019 07/10/2019 12/13/2018 09/06/2018 07/19/2018 3/83/2919  Systolic BP 166 060 045 997 741 423 953  Diastolic BP 88 76 69 58 77 61 89  Wt. (Lbs) 194 186.8 235 215.04 220 220 -  BMI 25.6 24.65 31 28.37 29.84 29.84 -

## 2020-07-21 ENCOUNTER — Other Ambulatory Visit: Payer: Self-pay | Admitting: Family Medicine

## 2020-07-23 ENCOUNTER — Ambulatory Visit (HOSPITAL_COMMUNITY)
Admission: RE | Admit: 2020-07-23 | Discharge: 2020-07-23 | Disposition: A | Discharge status: Home / Self Care | Payer: Medicare PPO | Source: Ambulatory Visit | Attending: Family Medicine | Admitting: Family Medicine

## 2020-07-23 ENCOUNTER — Other Ambulatory Visit: Payer: Self-pay

## 2020-07-23 DIAGNOSIS — Z780 Asymptomatic menopausal state: Secondary | ICD-10-CM

## 2020-07-23 DIAGNOSIS — M85852 Other specified disorders of bone density and structure, left thigh: Secondary | ICD-10-CM | POA: Diagnosis not present

## 2020-08-07 LAB — T4, FREE: Free T4: 0.96 ng/dL (ref 0.82–1.77)

## 2020-08-07 LAB — SPECIMEN STATUS REPORT

## 2020-08-07 LAB — T3, FREE: T3, Free: 2.6 pg/mL (ref 2.0–4.4)

## 2020-08-08 ENCOUNTER — Other Ambulatory Visit: Payer: Self-pay

## 2020-08-08 DIAGNOSIS — M8580 Other specified disorders of bone density and structure, unspecified site: Secondary | ICD-10-CM

## 2020-08-11 DIAGNOSIS — J449 Chronic obstructive pulmonary disease, unspecified: Secondary | ICD-10-CM | POA: Diagnosis not present

## 2020-08-22 ENCOUNTER — Other Ambulatory Visit: Payer: Self-pay

## 2020-08-22 ENCOUNTER — Ambulatory Visit (INDEPENDENT_AMBULATORY_CARE_PROVIDER_SITE_OTHER): Payer: Medicare PPO

## 2020-08-22 DIAGNOSIS — Z23 Encounter for immunization: Secondary | ICD-10-CM

## 2020-08-29 ENCOUNTER — Ambulatory Visit (INDEPENDENT_AMBULATORY_CARE_PROVIDER_SITE_OTHER): Payer: Medicare PPO | Admitting: "Endocrinology

## 2020-08-29 ENCOUNTER — Other Ambulatory Visit: Payer: Self-pay

## 2020-08-29 ENCOUNTER — Other Ambulatory Visit: Payer: Self-pay | Admitting: Family Medicine

## 2020-08-29 ENCOUNTER — Encounter: Payer: Self-pay | Admitting: "Endocrinology

## 2020-08-29 VITALS — BP 148/70 | HR 72 | Ht 73.0 in | Wt 193.2 lb

## 2020-08-29 DIAGNOSIS — M810 Age-related osteoporosis without current pathological fracture: Secondary | ICD-10-CM

## 2020-08-29 NOTE — Progress Notes (Signed)
08/29/2020      Endocrinology Consult Note    [name] Date of Birth:  05/26/1941   Past Medical History:  Diagnosis Date  . Abnormality of gait 06/06/2014  . Allergic rhinitis   . Anemia   . Ankle fracture, right   . Anxiety   . Arm fracture, left   . Arthritis    abnormal gait   . COPD (chronic obstructive pulmonary disease) (HCC)    chronic CO2 retention, decreased DLCO   . Cystic acne    adult   . 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 Degenerative disc disease, lumbar    L5 nerve impingement   . Dementia ([place])   . Depression   . DNR (do not resuscitate)   . GERD (gastroesophageal reflux disease)   . History of colon surgery    right hemicolectomy for high-grade adenomas in right colon 2013  . Hyperglycemia   . Hypertension   . Low back pain   . Mediastinal lymphadenopathy 1/9   resolving   . Memory difficulty 09/20/2014   mild dementia  . Onychomycosis   . OSA on CPAP   . Sleep apnea    stop bang score 6   Past Surgical History:  Procedure Laterality Date  . ABDOMINAL HYSTERECTOMY  1976   secondary to bleeding   . APPENDECTOMY    . COLON RESECTION  08/18/2012   Procedure: HAND ASSISTED LAPAROSCOPIC COLON RESECTION;  Surgeon: Jamesetta So, MD;  Location: [facility];  Service: General;  Laterality: N/A;  . COLONOSCOPY  07/20/2012   Dr. Gala Romney: multiple colonic polyps with large polyps on right side, s/p saline-assisted debulking piecemeal polypectomy and ablation. Not all removed. Path with tubulovillous adenomas, high grade dysplasia.   . COLONOSCOPY N/A 03/09/2013   [id number] polyps-tubular adenomas. S/p right hemicolectomy. next tcs 02/2018  . epidural steroids    . ESOPHAGOGASTRODUODENOSCOPY (EGD) WITH ESOPHAGEAL DILATION N/A 02/06/2014   Procedure: ESOPHAGOGASTRODUODENOSCOPY (EGD) WITH ESOPHAGEAL DILATION;  Surgeon: Daneil Dolin, MD;  Location: [facility];   Service: Endoscopy;  Laterality: N/A;  9:30  . OOPHORECTOMY  1976  . ORIF ANKLE FRACTURE  01/18/2012   Procedure: OPEN REDUCTION INTERNAL FIXATION (ORIF) ANKLE FRACTURE;  Surgeon: Arther Abbott, MD;  Location: [facility];  Service: Orthopedics;  Laterality: Left;  . ORIF ANKLE FRACTURE Right 01/13/2016   Procedure: OPEN REDUCTION INTERNAL FIXATION (ORIF) ANKLE FRACTURE;  Surgeon: Carole Civil, MD;  Location: [facility];  Service: Orthopedics;  Laterality: Right;   Social History   Socioeconomic History  . Marital status: Widowed    Spouse name: Not on file  . Number of children: 4  . Years of education: college 1  . Highest education level: Not on file  Occupational History  . Occupation: employed in the tobacco industry     Employer: RETIRED  Tobacco Use  . Smoking status: Former Smoker    Packs/day: 1.50    Years: 40.00    Pack years: 60.00    Types: Cigarettes    Quit date: 06/21/2006    Years since quitting: 14.2  . Smokeless tobacco: Never  Used  Vaping Use  . Vaping Use: Never used  Substance and Sexual Activity  . Alcohol use: No  . Drug use: No  . Sexual activity: Never  Other Topics Concern  . Not on file  Social History Narrative   Lives at home alone   Right-handed   Drinks 2 or more cups of coffee each morning   Social Determinants of Health   Financial Resource Strain:   . Difficulty of Paying Living Expenses: Not on file  Food Insecurity:   . Worried About Charity fundraiser in the Last Year: Not on file  . Ran Out of Food in the Last Year: Not on file  Transportation Needs:   . Lack of Transportation (Medical): Not on file  . Lack of Transportation (Non-Medical): Not on file  Physical Activity:   . Days of Exercise per Week: Not on file  . Minutes of Exercise per Session: Not on file  Stress:   . Feeling of Stress : Not on file  Social Connections:   . Frequency of Communication with Friends and Family: Not on file  . Frequency of Social Gatherings  with Friends and Family: Not on file  . Attends Religious Services: Not on file  . Active Member of Clubs or Organizations: Not on file  . Attends Archivist Meetings: Not on file  . Marital Status: Not on file   Outpatient Encounter Medications as of 08/29/2020  Medication Sig  . acetaminophen (TYLENOL) 650 MG CR tablet Take 650 mg by mouth every 8 (eight) hours as needed for pain.  [name] albuterol (VENTOLIN HFA) 108 (90 Base) MCG/ACT inhaler Inhale 2 puffs into the lungs every 6 (six) hours as needed for wheezing or shortness of breath.  [name] alendronate (FOSAMAX) 70 MG tablet TAKE 1 TABLET ONE TIME A WEEK. TAKE WITH A FULL GLASS OF WATER ON AN EMPTY STOMACH.  [name] amLODipine (NORVASC) 2.5 MG tablet TAKE 1 TABLET EVERY DAY  . atorvastatin (LIPITOR) 10 MG tablet TAKE 1 TABLET EVERY DAY  . calcium-vitamin D (OSCAL WITH D) 500-200 MG-UNIT per tablet Take 1 tablet by mouth daily.   [name] donepezil (ARICEPT) 10 MG tablet TAKE 1 TABLET AT BEDTIME   . FLUoxetine (PROZAC) 20 MG capsule TAKE 1 CAPSULE EVERY DAY  . loratadine (CLARITIN) 10 MG tablet Take 10 mg by mouth daily as needed for allergies.   . Multiple Vitamins-Minerals (CEROVITE SENIOR) TABS Take 1 tablet by mouth daily.   . Olodaterol HCl 2.5 MCG/ACT AERS Take two inhalations once daily at the same time  . omeprazole (PRILOSEC) 40 MG capsule TAKE 1 CAPSULE EVERY DAY  . OXYGEN Inhale 2 L into the lungs continuous.  . verapamil (CALAN-SR) 240 MG CR tablet TAKE 1 TABLET AT BEDTIME   No facility-administered encounter medications on file as of 08/29/2020.   ALLERGIES: Allergies  Allergen Reactions  . Fenofibrate Nausea And Vomiting  . Pravastatin Sodium Nausea And Vomiting  . Sulfonamide Derivatives Other (See Comments)    unknown  . Ciprofloxacin Rash    VACCINATION STATUS: Immunization History  Administered Date(s) Administered  . Fluad Quad(high Dose 65+) 07/10/2019, 08/22/2020  . Influenza Split 08/25/2011, 07/25/2012  .  Influenza Whole 09/12/2006, 08/25/2007, 08/01/2008, 08/20/2009, 08/20/2010  . Influenza, High Dose Seasonal PF 12/13/2018  . Influenza,inj,Quad PF,6+ Mos 08/21/2013, 09/04/2014, 08/13/2015, 07/08/2016, 09/23/2017  . Pneumococcal Conjugate-13 06/20/2014  . Pneumococcal Polysaccharide-23 09/12/2006, 08/20/2010, 04/16/2011  . Td 08/20/2010  . Zoster 12/26/2013     HPI  [name] is 79 y.o. female who presents today with a medical history as above. she is being seen in consultation for osteoporosis requested by [name], MD. Review of her medical records including review of her previous DEXA scan did not show T score less than -2.5, however by fracture criteria she is a patient with osteoporosis.  She gives history of previous fragility fracture of wrist, ankle.  These fractures happened prior to her initiation on Fosamax 5 years ago. She has a tendency to fall, currently on wheelchair.  She is accompanied by her daughter to the clinic. She has been on the following OP treatments: Alendronate 70 mg weekly for at least 5 years.  Reportedly, she tolerated medication very well.  She has no side effects, contraindications. She is also on vitamin D supplements along with low-dose calcium.  She also eats dairy and green, leafy, vegetables.   No weight bearing exercises.  No h/o hyper/hypocalcemia. No h/o hyperparathyroidism. No h/o kidney stones.  No h/o thyrotoxicosis. Reviewed TSH recent levels:  Lab Results  Component Value Date   TSH 0.336 (L) 07/14/2020   TSH 0.55 07/10/2019   TSH 0.79 04/14/2018   TSH 0.72 07/08/2016   TSH 1.028 11/06/2015    No h/o CKD. Last BUN/Cr: Lab Results  Component Value Date   BUN 18 07/14/2020   CREATININE 0.81 07/14/2020   Pt does not have a FH of osteoporosis.   Review of Systems  Constitutional: + Intimately fluctuating body weight,  no fatigue, no subjective hyperthermia, no subjective hypothermia Eyes: no blurry vision, no  xerophthalmia ENT: no sore throat, no nodules palpated in throat, no dysphagia/odynophagia, no hoarseness Cardiovascular: no Chest Pain, no Shortness of Breath, no palpitations, no leg swelling Respiratory: + Patient with COPD, on ongoing oxygen support, former smoker.    Gastrointestinal: no Nausea/Vomiting/Diarhhea Musculoskeletal: no muscle/joint aches Skin: no rashes Neurological: no tremors, no numbness, no tingling, no dizziness Psychiatric: no depression, no anxiety  Objective:    BP (!) 148/70   Pulse 72   Ht 6\' 1"  (1.854 m)   Wt 193 lb 3.2 oz (87.6 kg)   BMI 25.49 kg/m   Wt Readings from Last 3 Encounters:  08/29/20 193 lb 3.2 oz (87.6 kg)  07/14/20 194 lb (88 kg)  12/18/19 186 lb 12.8 oz (84.7 kg)    Physical Exam  Constitutional:  + BMI of 25.5, not in acute distress, normal state of mind, + on a wheelchair. Eyes: PERRLA, EOMI, no exophthalmos ENT: moist mucous membranes, no thyromegaly, no cervical lymphadenopathy Cardiovascular: normal precordial activity, Regular Rate and Rhythm, no Murmur/Rubs/Gallops Respiratory:  adequate breathing efforts, no gross chest deformity, Clear to auscultation bilaterally Gastrointestinal: abdomen soft, Non -tender, No distension, Bowel Sounds present Musculoskeletal: no gross deformities, strength intact in all four extremities Skin: moist, warm, no rashes Neurological: no tremor with outstretched hands, Deep tendon reflexes normal in all four extremities.  CMP ( most recent) CMP     Component Value Date/Time   NA 144 07/14/2020 1153   K 4.0 07/14/2020 1153   CL 105 07/14/2020 1153   CO2 27 07/14/2020 1153   GLUCOSE 82 07/14/2020 1153   GLUCOSE 104 12/18/2019 1201   BUN 18 07/14/2020 1153   CREATININE 0.81 07/14/2020 1153   CREATININE 1.01 (H) 12/18/2019 1201   CALCIUM 9.4 07/14/2020 1153   PROT 6.2 07/14/2020 1153   ALBUMIN 3.6 (L) 07/14/2020 1153   AST 15 07/14/2020 1153   ALT 12 07/14/2020 1153  ALKPHOS 97  07/14/2020 1153   BILITOT 0.3 07/14/2020 1153   GFRNONAA 70 07/14/2020 1153   GFRNONAA 53 (L) 12/18/2019 1201   GFRAA 80 07/14/2020 1153   GFRAA 62 12/18/2019 1201     Diabetic Labs (most recent): Lab Results  Component Value Date   HGBA1C 5.8 01/23/2010     Lipid Panel ( most recent) Lipid Panel     Component Value Date/Time   CHOL 135 07/14/2020 1153   TRIG 99 07/14/2020 1153   HDL 62 07/14/2020 1153   CHOLHDL 2.2 07/14/2020 1153   CHOLHDL 2.4 12/18/2019 1201   VLDL 34 (H) 02/14/2017 1156   LDLCALC 55 07/14/2020 1153   LDLCALC 75 12/18/2019 1201   LABVLDL 18 07/14/2020 1153      Lab Results  Component Value Date   TSH 0.336 (L) 07/14/2020   TSH 0.55 07/10/2019   TSH 0.79 04/14/2018   TSH 0.72 07/08/2016   TSH 1.028 11/06/2015   TSH 0.705 04/18/2014   TSH 0.712 11/20/2012   TSH 0.643 04/16/2011   TSH 0.806 01/23/2010   TSH 0.372 05/16/2008   FREET4 0.96 07/14/2020          Assessment: 1. Osteoporosis by history of fragility fracture 2.  Dual femur total T score -1.7  Plan: 1. 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  We reviewed her DEXA scans together (3 of them since 2010), and I explained that based on the T scores, she still has an increased risk for fractures.   - We discussed about the different medication classes, benefits and side effects (including atypical fractures and ONJ - no dental workup in progress or planned).   -She has tolerated alendronate orally for 5 years, will be continued on same medication along with low-dose calcium and vitamin D supplements.  -After approximately 8 years of treatment, she will be considered for drug holiday or switch to a different modality depending on her bone density in 2023. -She will return in 1 year for reevaluation with CMP, thyroid function test.  - we reviewed her dietary and supplemental calcium and vitamin D  intake, which I believe are adequate.   - discussed fall precautions   -Her next DEXA scan is not due until September 2023.  - I did not initiate any new prescriptions today. - I advised patient to maintain close follow up with [name], MD for primary care needs.  - Time spent with the patient: 45 minutes, of which >50% was spent in obtaining information about her symptoms, reviewing her previous labs, evaluations, and treatments, counseling her about her osteoporosis, and developing a plan to confirm the diagnosis and long term treatment as necessary.  [name] participated in the discussions, expressed understanding, and voiced agreement with the above plans.  All questions were answered to her satisfaction. she is encouraged to contact clinic should she have any questions or concerns prior to her return visit.  Follow up plan: Return in about 1 year (around 08/29/2021) for F/U with Pre-visit Labs.   Glade Lloyd, MD [facility] [address] [place], [place] [address] Phone: [phone]  Fax: [phone]     08/29/2020, 12:19 PM  This note was partially dictated with voice recognition software. Similar sounding words can be transcribed inadequately or may not  be corrected upon review.

## 2020-09-10 DIAGNOSIS — J449 Chronic obstructive pulmonary disease, unspecified: Secondary | ICD-10-CM | POA: Diagnosis not present

## 2020-09-30 ENCOUNTER — Ambulatory Visit
Admission: EM | Admit: 2020-09-30 | Discharge: 2020-09-30 | Disposition: A | Discharge status: Home / Self Care | Payer: Medicare PPO | Attending: Emergency Medicine | Admitting: Emergency Medicine

## 2020-09-30 ENCOUNTER — Encounter: Payer: Self-pay | Admitting: Orthopaedic Surgery

## 2020-09-30 ENCOUNTER — Other Ambulatory Visit: Payer: Self-pay

## 2020-09-30 ENCOUNTER — Ambulatory Visit
Admission: RE | Admit: 2020-09-30 | Discharge: 2020-09-30 | Disposition: A | Discharge status: Home / Self Care | Payer: Medicare PPO | Source: Ambulatory Visit | Attending: Emergency Medicine | Admitting: Emergency Medicine

## 2020-09-30 ENCOUNTER — Ambulatory Visit: Admission: EM | Admit: 2020-09-30 | Discharge: 2020-09-30 | Discharge status: Absent without leave | Payer: Medicare PPO

## 2020-09-30 ENCOUNTER — Ambulatory Visit (INDEPENDENT_AMBULATORY_CARE_PROVIDER_SITE_OTHER): Payer: Medicare PPO

## 2020-09-30 ENCOUNTER — Ambulatory Visit (INDEPENDENT_AMBULATORY_CARE_PROVIDER_SITE_OTHER): Payer: Medicare PPO | Admitting: Orthopaedic Surgery

## 2020-09-30 VITALS — BP 152/88 | HR 89 | Ht 73.0 in | Wt 193.0 lb

## 2020-09-30 VITALS — BP 116/63 | HR 88 | Temp 98.2°F | Resp 19 | Wt 191.8 lb

## 2020-09-30 DIAGNOSIS — S92335A Nondisplaced fracture of third metatarsal bone, left foot, initial encounter for closed fracture: Secondary | ICD-10-CM | POA: Diagnosis not present

## 2020-09-30 DIAGNOSIS — N390 Urinary tract infection, site not specified: Secondary | ICD-10-CM | POA: Diagnosis not present

## 2020-09-30 DIAGNOSIS — M79672 Pain in left foot: Secondary | ICD-10-CM

## 2020-09-30 DIAGNOSIS — R82998 Other abnormal findings in urine: Secondary | ICD-10-CM | POA: Insufficient documentation

## 2020-09-30 DIAGNOSIS — Z789 Other specified health status: Secondary | ICD-10-CM

## 2020-09-30 DIAGNOSIS — R8290 Unspecified abnormal findings in urine: Secondary | ICD-10-CM | POA: Diagnosis not present

## 2020-09-30 DIAGNOSIS — Z87891 Personal history of nicotine dependence: Secondary | ICD-10-CM | POA: Insufficient documentation

## 2020-09-30 DIAGNOSIS — F0150 Vascular dementia without behavioral disturbance: Secondary | ICD-10-CM

## 2020-09-30 DIAGNOSIS — W19XXXA Unspecified fall, initial encounter: Secondary | ICD-10-CM

## 2020-09-30 DIAGNOSIS — S92332A Displaced fracture of third metatarsal bone, left foot, initial encounter for closed fracture: Secondary | ICD-10-CM | POA: Diagnosis not present

## 2020-09-30 LAB — POCT URINALYSIS DIP (MANUAL ENTRY)
Bilirubin, UA: NEGATIVE
Glucose, UA: NEGATIVE mg/dL
Nitrite, UA: NEGATIVE
Protein Ur, POC: 30 mg/dL — AB
Spec Grav, UA: >=1.03 — AB (ref 1.010–1.025)
Urobilinogen, UA: 0.2 E.U./dL
pH, UA: 5.5 (ref 5.0–8.0)

## 2020-09-30 MED ORDER — CEPHALEXIN 500 MG PO CAPS: 500.0000 mg | ORAL_CAPSULE | Freq: Two times a day (BID) | ORAL | 0 refills | Status: AC

## 2020-09-30 NOTE — ED Notes (Signed)
Pt unable to provide urine specimen.  

## 2020-09-30 NOTE — ED Provider Notes (Signed)
[name]   [id number] 09/30/20 Arrival Time: [place]   Chief Complaint  Patient presents with  . Foot Injury     SUBJECTIVE: History from: patient and family.  [name] is a 79 y.o. female who presented to the urgent care for complaint of left foot pain and swelling that occurred yesterday.  Resolve the symptom after falling.  She localizes the pain to the left foot.  She describes the pain as constant and achy.  She has tried OTC Tylenol with mild relief.  His symptoms are made worse with ROM.  He denies similar symptoms in the past.  Denies chills, fever, nausea, vomiting, diarrhea  ROS: As per HPI.  All other pertinent ROS negative.     Past Medical History:  Diagnosis Date  . Abnormality of gait 06/06/2014  . Allergic rhinitis   . Anemia   . Ankle fracture, right   . Anxiety   . Arm fracture, left   . Arthritis    abnormal gait   . COPD (chronic obstructive pulmonary disease) (HCC)    chronic CO2 retention, decreased DLCO   . Cystic acne    adult   . 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 Degenerative disc disease, lumbar    L5 nerve impingement   . Dementia ([place])   . Depression   . DNR (do not resuscitate)   . GERD (gastroesophageal reflux disease)   . History of colon surgery    right hemicolectomy for high-grade adenomas in right colon 2013  . Hyperglycemia   . Hypertension   . Low back pain   . Mediastinal lymphadenopathy 1/9   resolving   . Memory difficulty 09/20/2014   mild dementia  . Onychomycosis   . OSA on CPAP   . Sleep apnea    stop bang score 6   Past Surgical History:  Procedure Laterality Date  . ABDOMINAL HYSTERECTOMY  1976   secondary to bleeding   . APPENDECTOMY    . COLON RESECTION  08/18/2012   Procedure: HAND ASSISTED LAPAROSCOPIC COLON RESECTION;  Surgeon: Jamesetta So, MD;  Location: [facility];  Service: General;  Laterality: N/A;  . COLONOSCOPY  07/20/2012   Dr. Gala Romney: multiple colonic polyps  with large polyps on right side, s/p saline-assisted debulking piecemeal polypectomy and ablation. Not all removed. Path with tubulovillous adenomas, high grade dysplasia.   . COLONOSCOPY N/A 03/09/2013   [id number] polyps-tubular adenomas. S/p right hemicolectomy. next tcs 02/2018  . epidural steroids    . ESOPHAGOGASTRODUODENOSCOPY (EGD) WITH ESOPHAGEAL DILATION N/A 02/06/2014   Procedure: ESOPHAGOGASTRODUODENOSCOPY (EGD) WITH ESOPHAGEAL DILATION;  Surgeon: Daneil Dolin, MD;  [facility];  Service: Endoscopy;  Laterality: N/A;  9:30  . OOPHORECTOMY  1976  . ORIF ANKLE FRACTURE  01/18/2012   Procedure: OPEN REDUCTION INTERNAL FIXATION (ORIF) ANKLE FRACTURE;  Surgeon: Arther Abbott, MD;  Location: [facility];  Service: Orthopedics;  Laterality: Left;  . ORIF ANKLE FRACTURE Right 01/13/2016   Procedure: OPEN REDUCTION INTERNAL FIXATION (ORIF) ANKLE FRACTURE;  Surgeon: Carole Civil, MD;  Location: [facility];  Service: Orthopedics;  Laterality: Right;   Allergies  Allergen Reactions  . Fenofibrate Nausea And Vomiting  . Pravastatin Sodium Nausea And Vomiting  . Sulfonamide Derivatives Other (See Comments)    unknown  . Ciprofloxacin Rash   No current facility-administered medications on file prior to encounter.   Current Outpatient Medications on File Prior to Encounter  Medication Sig Dispense Refill  .  acetaminophen (TYLENOL) 650 MG CR tablet Take 650 mg by mouth every 8 (eight) hours as needed for pain.    [name] albuterol (VENTOLIN HFA) 108 (90 Base) MCG/ACT inhaler Inhale 2 puffs into the lungs every 6 (six) hours as needed for wheezing or shortness of breath. 18 g 3  . alendronate (FOSAMAX) 70 MG tablet TAKE 1 TABLET ONE TIME A WEEK. TAKE WITH A FULL GLASS OF WATER ON AN EMPTY STOMACH. 12 tablet 1  . amLODipine (NORVASC) 2.5 MG tablet TAKE 1 TABLET EVERY DAY 90 tablet 1  . atorvastatin (LIPITOR) 10 MG tablet TAKE 1 TABLET EVERY DAY 90 tablet 3  . calcium-vitamin D (OSCAL WITH D)  500-200 MG-UNIT per tablet Take 1 tablet by mouth daily.     [name] donepezil (ARICEPT) 10 MG tablet TAKE 1 TABLET AT BEDTIME  90 tablet 1  . FLUoxetine (PROZAC) 20 MG capsule TAKE 1 CAPSULE EVERY DAY 90 capsule 1  . loratadine (CLARITIN) 10 MG tablet Take 10 mg by mouth daily as needed for allergies.     . Multiple Vitamins-Minerals (CEROVITE SENIOR) TABS Take 1 tablet by mouth daily.     . Olodaterol HCl 2.5 MCG/ACT AERS Take two inhalations once daily at the same time 4 g 3  . omeprazole (PRILOSEC) 40 MG capsule TAKE 1 CAPSULE EVERY DAY 90 capsule 1  . OXYGEN Inhale 2 L into the lungs continuous.    . verapamil (CALAN-SR) 240 MG CR tablet TAKE 1 TABLET AT BEDTIME 90 tablet 1   Social History   Socioeconomic History  . Marital status: Widowed    Spouse name: Not on file  . Number of children: 4  . Years of education: college 1  . Highest education level: Not on file  Occupational History  . Occupation: employed in the tobacco industry     Employer: RETIRED  Tobacco Use  . Smoking status: Former Smoker    Packs/day: 1.50    Years: 40.00    Pack years: 60.00    Types: Cigarettes    Quit date: 06/21/2006    Years since quitting: 14.2  . Smokeless tobacco: Never Used  Vaping Use  . Vaping Use: Never used  Substance and Sexual Activity  . Alcohol use: No  . Drug use: No  . Sexual activity: Never  Other Topics Concern  . Not on file  Social History Narrative   Lives at home alone   Right-handed   Drinks 2 or more cups of coffee each morning   Social Determinants of Health   Financial Resource Strain:   . Difficulty of Paying Living Expenses: Not on file  Food Insecurity:   . Worried About Charity fundraiser in the Last Year: Not on file  . Ran Out of Food in the Last Year: Not on file  Transportation Needs:   . Lack of Transportation (Medical): Not on file  . Lack of Transportation (Non-Medical): Not on file  Physical Activity:   . Days of Exercise per Week: Not on file    . Minutes of Exercise per Session: Not on file  Stress:   . Feeling of Stress : Not on file  Social Connections:   . Frequency of Communication with Friends and Family: Not on file  . Frequency of Social Gatherings with Friends and Family: Not on file  . Attends Religious Services: Not on file  . Active Member of Clubs or Organizations: Not on file  . Attends Archivist Meetings: Not  on file  . Marital Status: Not on file  Intimate Partner Violence:   . Fear of Current or Ex-Partner: Not on file  . Emotionally Abused: Not on file  . Physically Abused: Not on file  . Sexually Abused: Not on file   Family History  Problem Relation Age of Onset  . Stomach cancer Father   . Cancer Father   . Cancer Mother        brain   . Breast cancer Mother   . Arthritis Other     OBJECTIVE:  Vitals:   09/30/20 0939 09/30/20 0940  BP: 116/63   Pulse: 88   Resp: 19   Temp: 98.2 F (36.8 C)   TempSrc: Oral   SpO2: 92%   Weight:  191 lb 12.8 oz (87 kg)     Physical Exam Vitals and nursing note reviewed.  Constitutional:      General: She is not in acute distress.    Appearance: Normal appearance. She is normal weight. She is not ill-appearing, toxic-appearing or diaphoretic.  HENT:     Head: Normocephalic.  Cardiovascular:     Rate and Rhythm: Normal rate and regular rhythm.     Pulses: Normal pulses.     Heart sounds: Normal heart sounds. No murmur heard.  No friction rub. No gallop.   Pulmonary:     Effort: Pulmonary effort is normal. No respiratory distress.     Breath sounds: Normal breath sounds. No stridor. No wheezing, rhonchi or rales.  Chest:     Chest wall: No tenderness.  Musculoskeletal:        General: Tenderness present.     Right foot: Normal.     Comments: The left foot is with obvious deformity compared to the right foot.  Swelling is present.  There is no ecchymosis, open wound, lesion, surface trauma or warmth present.  Limited range of motion due  to pain.  Neurovascular status intact.  Neurological:     Mental Status: She is alert and oriented to person, place, and time.     LABS:  No results found for this or any previous visit (from the past 24 hour(s)).   RADIOLOGY:  No results found.   The left foot x-ray is positive for proximal fracture of the third metatarsal.  I have reviewed the x-ray myself and the radiologist interpretation.  I am in agreement with the radiologist interpretation.   ASSESSMENT & PLAN:  1. Left foot pain   2. Closed nondisplaced fracture of third metatarsal bone of left foot, initial encounter     No orders of the defined types were placed in this encounter.   Discharge instructions  Continue to take OTC Tylenol as needed for pain Follow-up with PCP Follow RICE instruction that is attached Return or go to ED if you develop any new or worsening symptoms  Reviewed expectations re: course of current medical issues. Questions answered. Outlined signs and symptoms indicating need for more acute intervention. Patient verbalized understanding. After Visit Summary given.         Emerson Monte, [place] 09/30/20 1039

## 2020-09-30 NOTE — ED Triage Notes (Signed)
Pt fell yesterday, LT foot pain and swelling.  Also daughter is worried about UTI, pt seems more confused than usual (hx of dementia) and urine has foul odor.

## 2020-09-30 NOTE — Progress Notes (Signed)
Subjective:    Patient ID: [name], female    DOB: March 01, 1941, 79 y.o.   MRN: [id number]  HPI She fell at home yesterday and hurt her left foot.  She was seen at Urgent Care.  X-rays showed nondisplaced fracture of left third metatarsal near the base.  She has no other injury. She is on supplemental oxygen.  I have reviewed the notes.  I have independently reviewed and interpreted x-rays of this patient done at another site by another physician or qualified health professional.     Review of Systems  Constitutional: Positive for activity change.  Respiratory: Positive for shortness of breath.   Musculoskeletal: Positive for arthralgias, gait problem and joint swelling.  Psychiatric/Behavioral: Positive for confusion.  All other systems reviewed and are negative.  For Review of Systems, all other systems reviewed and are negative.  The following is a summary of the past history medically, past history surgically, known current medicines, social history and family history.  This information is gathered electronically by the computer from prior information and documentation.  I review this each visit and have found including this information at this point in the chart is beneficial and informative.   Past Medical History:  Diagnosis Date  . Abnormality of gait 06/06/2014  . Allergic rhinitis   . Anemia   . Ankle fracture, right   . Anxiety   . Arm fracture, left   . Arthritis    abnormal gait   . COPD (chronic obstructive pulmonary disease) (HCC)    chronic CO2 retention, decreased DLCO   . Cystic acne    adult   . 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 Degenerative disc disease, lumbar    L5 nerve impingement   . Dementia ([place])   . Depression   . DNR (do not resuscitate)   . GERD (gastroesophageal reflux disease)   . History of colon surgery    right hemicolectomy for high-grade adenomas in right colon 2013  . Hyperglycemia   . Hypertension    . Low back pain   . Mediastinal lymphadenopathy 1/9   resolving   . Memory difficulty 09/20/2014   mild dementia  . Onychomycosis   . OSA on CPAP   . Sleep apnea    stop bang score 6    Past Surgical History:  Procedure Laterality Date  . ABDOMINAL HYSTERECTOMY  1976   secondary to bleeding   . APPENDECTOMY    . COLON RESECTION  08/18/2012   Procedure: HAND ASSISTED LAPAROSCOPIC COLON RESECTION;  Surgeon: Jamesetta So, MD;  Location: [facility];  Service: General;  Laterality: N/A;  . COLONOSCOPY  07/20/2012   Dr. Gala Romney: multiple colonic polyps with large polyps on right side, s/p saline-assisted debulking piecemeal polypectomy and ablation. Not all removed. Path with tubulovillous adenomas, high grade dysplasia.   . COLONOSCOPY N/A 03/09/2013   [id number] polyps-tubular adenomas. S/p right hemicolectomy. next tcs 02/2018  . epidural steroids    . ESOPHAGOGASTRODUODENOSCOPY (EGD) WITH ESOPHAGEAL DILATION N/A 02/06/2014   Procedure: ESOPHAGOGASTRODUODENOSCOPY (EGD) WITH ESOPHAGEAL DILATION;  Surgeon: Daneil Dolin, MD;  Location: [facility];  Service: Endoscopy;  Laterality: N/A;  9:30  . OOPHORECTOMY  1976  . ORIF ANKLE FRACTURE  01/18/2012   Procedure: OPEN REDUCTION INTERNAL FIXATION (ORIF) ANKLE FRACTURE;  Surgeon: Arther Abbott, MD;  Location: [facility];  Service: Orthopedics;  Laterality: Left;  . ORIF ANKLE FRACTURE Right 01/13/2016   Procedure: OPEN REDUCTION INTERNAL  FIXATION (ORIF) ANKLE FRACTURE;  Surgeon: Carole Civil, MD;  Location: [facility];  Service: Orthopedics;  Laterality: Right;    Current Outpatient Medications on File Prior to Visit  Medication Sig Dispense Refill  . acetaminophen (TYLENOL) 650 MG CR tablet Take 650 mg by mouth every 8 (eight) hours as needed for pain.    [name] albuterol (VENTOLIN HFA) 108 (90 Base) MCG/ACT inhaler Inhale 2 puffs into the lungs every 6 (six) hours as needed for wheezing or shortness of breath. 18 g 3  . alendronate (FOSAMAX) 70 MG  tablet TAKE 1 TABLET ONE TIME A WEEK. TAKE WITH A FULL GLASS OF WATER ON AN EMPTY STOMACH. 12 tablet 1  . amLODipine (NORVASC) 2.5 MG tablet TAKE 1 TABLET EVERY DAY 90 tablet 1  . atorvastatin (LIPITOR) 10 MG tablet TAKE 1 TABLET EVERY DAY 90 tablet 3  . calcium-vitamin D (OSCAL WITH D) 500-200 MG-UNIT per tablet Take 1 tablet by mouth daily.     . cephALEXin (KEFLEX) 500 MG capsule Take 1 capsule (500 mg total) by mouth 2 (two) times daily for 7 days. 14 capsule 0  . donepezil (ARICEPT) 10 MG tablet TAKE 1 TABLET AT BEDTIME  90 tablet 1  . FLUoxetine (PROZAC) 20 MG capsule TAKE 1 CAPSULE EVERY DAY 90 capsule 1  . loratadine (CLARITIN) 10 MG tablet Take 10 mg by mouth daily as needed for allergies.     . Multiple Vitamins-Minerals (CEROVITE SENIOR) TABS Take 1 tablet by mouth daily.     . Olodaterol HCl 2.5 MCG/ACT AERS Take two inhalations once daily at the same time 4 g 3  . omeprazole (PRILOSEC) 40 MG capsule TAKE 1 CAPSULE EVERY DAY 90 capsule 1  . OXYGEN Inhale 2 L into the lungs continuous.    . verapamil (CALAN-SR) 240 MG CR tablet TAKE 1 TABLET AT BEDTIME 90 tablet 1   No current facility-administered medications on file prior to visit.    Social History   Socioeconomic History  . Marital status: Widowed    Spouse name: Not on file  . Number of children: 4  . Years of education: college 1  . Highest education level: Not on file  Occupational History  . Occupation: employed in the tobacco industry     Employer: RETIRED  Tobacco Use  . Smoking status: Former Smoker    Packs/day: 1.50    Years: 40.00    Pack years: 60.00    Types: Cigarettes    Quit date: 06/21/2006    Years since quitting: 14.2  . Smokeless tobacco: Never Used  Vaping Use  . Vaping Use: Never used  Substance and Sexual Activity  . Alcohol use: No  . Drug use: No  . Sexual activity: Never  Other Topics Concern  . Not on file  Social History Narrative   Lives at home alone   Right-handed   Drinks 2  or more cups of coffee each morning   Social Determinants of Health   Financial Resource Strain:   . Difficulty of Paying Living Expenses: Not on file  Food Insecurity:   . Worried About Charity fundraiser in the Last Year: Not on file  . Ran Out of Food in the Last Year: Not on file  Transportation Needs:   . Lack of Transportation (Medical): Not on file  . Lack of Transportation (Non-Medical): Not on file  Physical Activity:   . Days of Exercise per Week: Not on file  . Minutes of  Exercise per Session: Not on file  Stress:   . Feeling of Stress : Not on file  Social Connections:   . Frequency of Communication with Friends and Family: Not on file  . Frequency of Social Gatherings with Friends and Family: Not on file  . Attends Religious Services: Not on file  . Active Member of Clubs or Organizations: Not on file  . Attends Archivist Meetings: Not on file  . Marital Status: Not on file  Intimate Partner Violence:   . Fear of Current or Ex-Partner: Not on file  . Emotionally Abused: Not on file  . Physically Abused: Not on file  . Sexually Abused: Not on file    Family History  Problem Relation Age of Onset  . Stomach cancer Father   . Cancer Father   . Cancer Mother        brain   . Breast cancer Mother   . Arthritis Other     BP (!) 152/88   Pulse 89   Ht 6\' 1"  (1.854 m)   Wt 193 lb (87.5 kg)   BMI 25.46 kg/m   Body mass index is 25.46 kg/m.      Objective:   Physical Exam Vitals and nursing note reviewed. Exam conducted with a chaperone present.  Constitutional:      Appearance: She is well-developed.  HENT:     Head: Normocephalic and atraumatic.  Eyes:     Conjunctiva/sclera: Conjunctivae normal.     Pupils: Pupils are equal, round, and reactive to light.  Cardiovascular:     Rate and Rhythm: Normal rate and regular rhythm.  Pulmonary:     Effort: Pulmonary effort is normal.     Comments: Supplemental oxygen Abdominal:      Palpations: Abdomen is soft.  Musculoskeletal:     Cervical back: Normal range of motion and neck supple.       Feet:  Skin:    General: Skin is warm and dry.  Neurological:     Mental Status: She is alert and oriented to person, place, and time.     Cranial Nerves: No cranial nerve deficit.     Motor: No abnormal muscle tone.     Coordination: Coordination normal.     Deep Tendon Reflexes: Reflexes are normal and symmetric. Reflexes normal.  Psychiatric:        Mood and Affect: Mood normal.        Behavior: Behavior normal.        Thought Content: Thought content normal.     Comments: Disoriented as to time and place.           Assessment & Plan:   Encounter Diagnoses  Name Primary?  . Closed nondisplaced fracture of third metatarsal bone of left foot, initial encounter Yes  . Vascular dementia without behavioral disturbance ([place])   . On supplemental oxygen by nasal cannula    A CAM walker was given.  Instructions given.  Return in three weeks.  X-rays of the left foot then.  Call if any problem.  Precautions discussed.   Electronically Signed Sanjuana Kava, MD 11/16/20212:37 PM

## 2020-09-30 NOTE — ED Triage Notes (Signed)
Pt brought urine sample back in to office from earlier visit

## 2020-09-30 NOTE — Discharge Instructions (Addendum)
Continue to take OTC Tylenol as needed for pain Follow-up with PCP Follow RICE instruction that is attached Return or go to ED if you develop any new or worsening symptoms

## 2020-10-02 ENCOUNTER — Other Ambulatory Visit: Payer: Self-pay | Admitting: Family Medicine

## 2020-10-03 LAB — URINE CULTURE: Culture: >=100000 — AB

## 2020-10-11 DIAGNOSIS — J449 Chronic obstructive pulmonary disease, unspecified: Secondary | ICD-10-CM | POA: Diagnosis not present

## 2020-10-14 NOTE — Discharge Instructions (Addendum)
Urine culture sent.  We will call you with the results.   Push fluids and get plenty of rest.   Take antibiotic as directed and to completion Follow up with PCP if symptoms persists Return here or go to ER if you have any new or worsening symptoms such as fever, worsening abdominal pain, nausea/vomiting, flank pain, etc... 

## 2020-10-14 NOTE — ED Provider Notes (Addendum)
[place]   CC: Foul odor urine  SUBJECTIVE:  [name] is a 79 y.o. female returned to the urgent care with a complaint of confusion and foul odor  urine for the past few days.  Seen earlier but was unable to urinate.  Patient denies a precipitating event, recent sexual encounter, excessive caffeine intake.  Has tried OTC medications without relief.  Denies of any aggravating factors.  Admits to similar symptoms in the past.  Denies fever, chills, nausea, vomiting, abdominal pain, flank pain, abnormal vaginal discharge or bleeding, hematuria.    LMP: No LMP recorded. Patient has had a hysterectomy.  ROS: As in HPI.  All other pertinent ROS negative.     Past Medical History:  Diagnosis Date   Abnormality of gait 06/06/2014   Allergic rhinitis    Anemia    Ankle fracture, right    Anxiety    Arm fracture, left    Arthritis    abnormal gait    COPD (chronic obstructive pulmonary disease) (HCC)    chronic CO2 retention, decreased DLCO    Cystic acne    adult    Degenerative disc disease, cervical    syrinx C3-7   Degenerative disc disease, lumbar    L5 nerve impingement    Dementia (HCC)    Depression    DNR (do not resuscitate)    GERD (gastroesophageal reflux disease)    History of colon surgery    right hemicolectomy for high-grade adenomas in right colon 2013   Hyperglycemia    Hypertension    Low back pain    Mediastinal lymphadenopathy 1/9   resolving    Memory difficulty 09/20/2014   mild dementia   Onychomycosis    OSA on CPAP    Sleep apnea    stop bang score 6   Past Surgical History:  Procedure Laterality Date   ABDOMINAL HYSTERECTOMY  1976   secondary to bleeding    APPENDECTOMY     COLON RESECTION  08/18/2012   Procedure: HAND ASSISTED LAPAROSCOPIC COLON RESECTION;  Surgeon: Jamesetta So, MD;  Location: [facility];  Service: General;  Laterality: N/A;   COLONOSCOPY  07/20/2012   Dr. Gala Romney: multiple  colonic polyps with large polyps on right side, s/p saline-assisted debulking piecemeal polypectomy and ablation. Not all removed. Path with tubulovillous adenomas, high grade dysplasia.    COLONOSCOPY N/A 03/09/2013   [id number] polyps-tubular adenomas. S/p right hemicolectomy. next tcs 02/2018   epidural steroids     ESOPHAGOGASTRODUODENOSCOPY (EGD) WITH ESOPHAGEAL DILATION N/A 02/06/2014   Procedure: ESOPHAGOGASTRODUODENOSCOPY (EGD) WITH ESOPHAGEAL DILATION;  Surgeon: Daneil Dolin, MD;  [facility];  Service: Endoscopy;  Laterality: N/A;  9:30   OOPHORECTOMY  1976   ORIF ANKLE FRACTURE  01/18/2012   Procedure: OPEN REDUCTION INTERNAL FIXATION (ORIF) ANKLE FRACTURE;  Surgeon: Arther Abbott, MD;  Location: [facility];  Service: Orthopedics;  Laterality: Left;   ORIF ANKLE FRACTURE Right 01/13/2016   Procedure: OPEN REDUCTION INTERNAL FIXATION (ORIF) ANKLE FRACTURE;  Surgeon: Carole Civil, MD;  Location: [facility];  Service: Orthopedics;  Laterality: Right;   Allergies  Allergen Reactions   Fenofibrate Nausea And Vomiting   Pravastatin Sodium Nausea And Vomiting   Sulfonamide Derivatives Other (See Comments)    unknown   Ciprofloxacin Rash   No current facility-administered medications on file prior to encounter.   Current Outpatient Medications on File Prior to Encounter  Medication Sig Dispense Refill   acetaminophen (TYLENOL) 650  MG CR tablet Take 650 mg by mouth every 8 (eight) hours as needed for pain.     albuterol (VENTOLIN HFA) 108 (90 Base) MCG/ACT inhaler Inhale 2 puffs into the lungs every 6 (six) hours as needed for wheezing or shortness of breath. 18 g 3   alendronate (FOSAMAX) 70 MG tablet TAKE 1 TABLET ONE TIME A WEEK. TAKE WITH A FULL GLASS OF WATER ON AN EMPTY STOMACH. 12 tablet 1   amLODipine (NORVASC) 2.5 MG tablet TAKE 1 TABLET EVERY DAY 90 tablet 1   atorvastatin (LIPITOR) 10 MG tablet TAKE 1 TABLET EVERY DAY 90 tablet 3   calcium-vitamin D  (OSCAL WITH D) 500-200 MG-UNIT per tablet Take 1 tablet by mouth daily.      donepezil (ARICEPT) 10 MG tablet TAKE 1 TABLET AT BEDTIME  90 tablet 1   FLUoxetine (PROZAC) 20 MG capsule TAKE 1 CAPSULE EVERY DAY 90 capsule 1   loratadine (CLARITIN) 10 MG tablet Take 10 mg by mouth daily as needed for allergies.      Multiple Vitamins-Minerals (CEROVITE SENIOR) TABS Take 1 tablet by mouth daily.      OXYGEN Inhale 2 L into the lungs continuous.     Social History   Socioeconomic History   Marital status: Widowed    Spouse name: Not on file   Number of children: 4   Years of education: college 1   Highest education level: Not on file  Occupational History   Occupation: employed in the tobacco industry     Employer: RETIRED  Tobacco Use   Smoking status: Former Smoker    Packs/day: 1.50    Years: 40.00    Pack years: 60.00    Types: Cigarettes    Quit date: 06/21/2006    Years since quitting: 14.3   Smokeless tobacco: Never Used  Vaping Use   Vaping Use: Never used  Substance and Sexual Activity   Alcohol use: No   Drug use: No   Sexual activity: Never  Other Topics Concern   Not on file  Social History Narrative   Lives at home alone   Right-handed   Drinks 2 or more cups of coffee each morning   Social Determinants of Health   Financial Resource Strain:    Difficulty of Paying Living Expenses: Not on file  Food Insecurity:    Worried About Charity fundraiser in the Last Year: Not on file   [employer] of Food in the Last Year: Not on file  Transportation Needs:    Lack of Transportation (Medical): Not on file   Lack of Transportation (Non-Medical): Not on file  Physical Activity:    Days of Exercise per Week: Not on file   Minutes of Exercise per Session: Not on file  Stress:    Feeling of Stress : Not on file  Social Connections:    Frequency of Communication with Friends and Family: Not on file   Frequency of Social Gatherings with Friends  and Family: Not on file   Attends Religious Services: Not on file   Active Member of Clubs or Organizations: Not on file   Attends Archivist Meetings: Not on file   Marital Status: Not on file  Intimate Partner Violence:    Fear of Current or Ex-Partner: Not on file   Emotionally Abused: Not on file   Physically Abused: Not on file   Sexually Abused: Not on file   Family History  Problem Relation Age of  Onset   Stomach cancer Father    Cancer Father    Cancer Mother        brain    Breast cancer Mother    Arthritis Other     OBJECTIVE:  There were no vitals filed for this visit. General appearance: AOx3 in no acute distress HEENT: NCAT.  Oropharynx clear.  Lungs: clear to auscultation bilaterally without adventitious breath sounds Heart: regular rate and rhythm.  Radial pulses 2+ symmetrical bilaterally Abdomen: soft; non-distended; no tenderness; bowel sounds present; no guarding or rebound tenderness Back: no CVA tenderness Extremities: no edema; symmetrical with no gross deformities Skin: warm and dry Neurologic: Ambulates from chair to exam table without difficulty Psychological: alert and cooperative; normal mood and affect  Labs Reviewed  URINE CULTURE - Abnormal; Notable for the following components:      Result Value   Culture >=100,000 COLONIES/mL ESCHERICHIA COLI (*)    Organism ID, Bacteria ESCHERICHIA COLI (*)    All other components within normal limits  POCT URINALYSIS DIP (MANUAL ENTRY) - Abnormal; Notable for the following components:   Color, UA straw (*)    Clarity, UA turbid (*)    Ketones, POC UA trace (5) (*)    Spec Grav, UA >=1.030 (*)    Blood, UA small (*)    Protein Ur, POC =30 (*)    Leukocytes, UA Small (1+) (*)    All other components within normal limits    ASSESSMENT & PLAN:  1. Foul smelling urine   2. Acute lower UTI     Meds ordered this encounter  Medications   cephALEXin (KEFLEX) 500 MG capsule     Sig: Take 1 capsule (500 mg total) by mouth 2 (two) times daily for 7 days.    Dispense:  14 capsule    Refill:  0   Discharge instructions  Urine culture sent.  We will call you with the results.   Push fluids and get plenty of rest.   Take antibiotic as directed and to completion Follow up with PCP if symptoms persists Return here or go to ER if you have any new or worsening symptoms such as fever, worsening abdominal pain, nausea/vomiting, flank pain, etc...  Outlined signs and symptoms indicating need for more acute intervention. Patient verbalized understanding. After Visit Summary given.     Emerson Monte, FNP 10/14/20 1954    Emerson Monte, FNP 10/14/20 1958

## 2020-10-16 ENCOUNTER — Other Ambulatory Visit: Payer: Self-pay | Admitting: Family Medicine

## 2020-10-16 DIAGNOSIS — R413 Other amnesia: Secondary | ICD-10-CM

## 2020-10-21 ENCOUNTER — Other Ambulatory Visit: Payer: Self-pay

## 2020-10-21 ENCOUNTER — Encounter: Payer: Self-pay | Admitting: Orthopaedic Surgery

## 2020-10-21 ENCOUNTER — Telehealth: Payer: Self-pay

## 2020-10-21 ENCOUNTER — Ambulatory Visit (INDEPENDENT_AMBULATORY_CARE_PROVIDER_SITE_OTHER): Payer: Medicare PPO | Admitting: Orthopaedic Surgery

## 2020-10-21 ENCOUNTER — Ambulatory Visit: Payer: Medicare PPO

## 2020-10-21 DIAGNOSIS — S92335D Nondisplaced fracture of third metatarsal bone, left foot, subsequent encounter for fracture with routine healing: Secondary | ICD-10-CM

## 2020-10-21 DIAGNOSIS — Z789 Other specified health status: Secondary | ICD-10-CM

## 2020-10-21 NOTE — Telephone Encounter (Signed)
Needs visit to document and fully address the need, telephone/ video OK

## 2020-10-21 NOTE — Progress Notes (Signed)
My foot is better  She has been using the CAM walker and had no new problem. She has good ROM of the left ankle.  NV intact.  X-rays were done of the left foot, reported separately.  Encounter Diagnoses  Name Primary?  . Closed nondisplaced fracture of third metatarsal bone of left foot with routine healing, subsequent encounter Yes  . On supplemental oxygen by nasal cannula    Come out of the CAM walker in the house.  Return in one month.  X-rays on return.  Wear the CAM walker in public.  Call if any problem.  Precautions discussed.   Electronically Signed Sanjuana Kava, MD 12/7/202110:25 AM

## 2020-10-21 NOTE — Telephone Encounter (Signed)
Patient's daughter called. She stated the patient had fallen and broken her foot. Her sister is now having to stay with her mother full time. They are wanting a letter stating the patient needs constant care for the sisters job. Please advise.Daughter also states patients dementia is getting worse. This is another reason why they are requesting a letter.

## 2020-10-21 NOTE — Telephone Encounter (Signed)
Patient scheduled for phone visit 12/8 at 1:40

## 2020-10-22 ENCOUNTER — Ambulatory Visit: Payer: Medicare PPO | Admitting: Family Medicine

## 2020-11-06 ENCOUNTER — Other Ambulatory Visit: Payer: Self-pay

## 2020-11-06 ENCOUNTER — Telehealth (INDEPENDENT_AMBULATORY_CARE_PROVIDER_SITE_OTHER): Payer: Medicare PPO | Admitting: Family Medicine

## 2020-11-06 DIAGNOSIS — Z9181 History of falling: Secondary | ICD-10-CM

## 2020-11-06 DIAGNOSIS — F5104 Psychophysiologic insomnia: Secondary | ICD-10-CM

## 2020-11-06 DIAGNOSIS — Z0289 Encounter for other administrative examinations: Secondary | ICD-10-CM

## 2020-11-06 MED ORDER — HYDROXYZINE HCL 10 MG PO TABS: ORAL_TABLET | ORAL | 2 refills | Status: DC

## 2020-11-06 NOTE — Progress Notes (Signed)
Virtual Visit via Telephone Note  I connected with [name] on 11/06/20 at 11:20 AM EST by telephone and verified that I am speaking with the correct person using two identifiers.  Location: Patient: home with daughter Amy who is calling for help with care Provider: office   I discussed the limitations, risks, security and privacy concerns of performing an evaluation and management service by telephone and the availability of in person appointments. I also discussed with the patient that there may be a patient responsible charge related to this service. The patient expressed understanding and agreed to proceed.   History of Present Illness: Mother , Ashly has moved in with daughter Amy in [place] , [place], because of worsening dementia. staying up at night, increased forgetfulness No falls since moving in with Amy, however fell and fractured left foot on 09/29/2020 Calling for help with sleep medication, for Mom also requests papaerwork to cover absence from her job from Dec 1 through March 31 to care for her mother. Plan is to get 24/7 care at [facility]    Observations/Objective: N/A  Assessment and Plan:  Encounter for completion of form with patient Telephone visit with daughter , Amy, who Ms Gomez has been living with since Dec1, due to worsening dementia , recurrent falls,  Most recent was in 09/2020 Will complete form for 3 month absence per request to care for Mother  At high risk for falls Home safety reviewed and discussed   Insomnia Sleep hygiene reviewed and written information offered also. Prescription sent for  medication needed. Low dose hydroxyzine prescribed, daughter aware of increased fall risk with all sleep med, and states she will be watching /monitoring her mother, Contrina for safety to reduce falls    ' Follow Up Instructions:    I discussed the assessment and treatment plan with the patient. The patient was provided an opportunity to ask  questions and all were answered. The patient agreed with the plan and demonstrated an understanding of the instructions.   The patient was advised to call back or seek an in-person evaluation if the symptoms worsen or if the condition fails to improve as anticipated.  I provided 15 minutes of non-face-to-face time during this encounter.   Tula Nakayama, MD

## 2020-11-06 NOTE — Patient Instructions (Signed)
F/U I February as before, call if you need me sooner  New for sleep is hydroxyzine one at bedtime, please monitor closely as increased fall risk and she has a h/o repeated falls, if she gets unsteady, stop the medication  Paperwork to be sent for 3 month absence to care for your Mother, we will contact you about this

## 2020-11-10 DIAGNOSIS — J449 Chronic obstructive pulmonary disease, unspecified: Secondary | ICD-10-CM | POA: Diagnosis not present

## 2020-11-14 ENCOUNTER — Encounter: Payer: Self-pay | Admitting: Family Medicine

## 2020-11-14 DIAGNOSIS — G4700 Insomnia, unspecified: Secondary | ICD-10-CM | POA: Insufficient documentation

## 2020-11-14 DIAGNOSIS — Z0289 Encounter for other administrative examinations: Secondary | ICD-10-CM | POA: Insufficient documentation

## 2020-11-14 NOTE — Assessment & Plan Note (Signed)
Telephone visit with daughter , Linton Rump, who Ms Leaming has been living with since Dec1, due to worsening dementia , recurrent falls,  Most recent was in 09/2020 Will complete form for 3 month absence per request to care for Mother

## 2020-11-14 NOTE — Assessment & Plan Note (Addendum)
Sleep hygiene reviewed and written information offered also. Prescription sent for  medication needed. Low dose hydroxyzine prescribed, daughter aware of increased fall risk with all sleep med, and states she will be watching /monitoring her mother, [name] for safety to reduce falls

## 2020-11-14 NOTE — Assessment & Plan Note (Signed)
Home safety reviewed and discussed ?

## 2020-11-18 ENCOUNTER — Ambulatory Visit: Payer: Medicare PPO

## 2020-11-18 ENCOUNTER — Encounter: Payer: Self-pay | Admitting: Orthopaedic Surgery

## 2020-11-18 ENCOUNTER — Other Ambulatory Visit: Payer: Self-pay

## 2020-11-18 ENCOUNTER — Ambulatory Visit (INDEPENDENT_AMBULATORY_CARE_PROVIDER_SITE_OTHER): Payer: Medicare PPO | Admitting: Orthopaedic Surgery

## 2020-11-18 VITALS — Ht 73.0 in | Wt 193.0 lb

## 2020-11-18 DIAGNOSIS — S92335D Nondisplaced fracture of third metatarsal bone, left foot, subsequent encounter for fracture with routine healing: Secondary | ICD-10-CM

## 2020-11-18 NOTE — Progress Notes (Signed)
My foot does not hurt  She has done well with the left foot. She has no pain now and is using regular shoes.  She has no redness or swelling.  NV intact.  ROM is full of ankle.  X-rays were done of the left foot, reported separately.  Encounter Diagnosis  Name Primary?  . Closed nondisplaced fracture of third metatarsal bone of left foot with routine healing, subsequent encounter Yes   I will discharge her now.  See as needed.  Call if any problem.  Precautions discussed.  Electronically Signed Darreld Mclean, MD 1/4/202211:26 AM

## 2020-12-02 ENCOUNTER — Telehealth (INDEPENDENT_AMBULATORY_CARE_PROVIDER_SITE_OTHER): Payer: Medicare PPO | Admitting: Family Medicine

## 2020-12-02 ENCOUNTER — Other Ambulatory Visit: Payer: Self-pay

## 2020-12-02 ENCOUNTER — Encounter: Payer: Self-pay | Admitting: Family Medicine

## 2020-12-02 VITALS — Ht 73.0 in | Wt 185.0 lb

## 2020-12-02 DIAGNOSIS — Z0000 Encounter for general adult medical examination without abnormal findings: Secondary | ICD-10-CM

## 2020-12-02 NOTE — Progress Notes (Signed)
Subjective:   [name] is a 80 y.o. female who presents for Medicare Annual (Subsequent) preventive examination.  Participants: Nurse for intake and work up; Patient and Provider for Visit and Wrap up  Method of visit: Telephone  Location of Patient: Home Location of Provider: Office Consent was obtain for visit over the telephone. Services rendered by provider: Visit was performed via telephone   I verified that I am speaking with the correct person using two identifiers.  Review of Systems Cardiac Risk Factors include: advanced age (>34men, >45 women)     Objective:    Today's Vitals   12/02/20 1420 12/02/20 1421  Weight: 185 lb (83.9 kg)   Height: 6\' 1"  (1.854 m)   PainSc: 0-No pain 0-No pain   Body mass index is 24.41 kg/m.  Advanced Directives 12/02/2020 02/27/2019 11/27/2018 07/13/2018 07/12/2018 02/20/2018 03/10/2017  Does Patient Have a Medical Advance Directive? Yes Yes No Yes No Yes Yes  Type of Advertising copywriter - Healthcare Power of Attorney - - Healthcare Power of Attorney  Does patient want to make changes to medical advance directive? - - - - - No - Patient declined Yes (MAU/Ambulatory/Procedural Areas - Information given)  Copy of Healthcare Power of Attorney in Chart? No - copy requested No - copy requested - - - - No - copy requested  Would patient like information on creating a medical advance directive? No - Patient declined - No - Patient declined - - - -  Pre-existing out of facility DNR order (yellow form or pink MOST form) - - - - - - -    Current Medications (verified) Outpatient Encounter Medications as of 12/02/2020  Medication Sig  . acetaminophen (TYLENOL) 650 MG CR tablet Take 650 mg by mouth every 8 (eight) hours as needed for pain.  [name] albuterol (VENTOLIN HFA) 108 (90 Base) MCG/ACT inhaler Inhale 2 puffs into the lungs every 6 (six) hours as needed for wheezing or shortness of  breath.  [name] alendronate (FOSAMAX) 70 MG tablet TAKE 1 TABLET ONE TIME A WEEK. TAKE WITH A FULL GLASS OF WATER ON AN EMPTY STOMACH.  [name] amLODipine (NORVASC) 2.5 MG tablet TAKE 1 TABLET EVERY DAY  . atorvastatin (LIPITOR) 10 MG tablet TAKE 1 TABLET EVERY DAY  . calcium-vitamin D (OSCAL WITH D) 500-200 MG-UNIT per tablet Take 1 tablet by mouth daily.  [name] donepezil (ARICEPT) 10 MG tablet TAKE 1 TABLET AT BEDTIME  . FLUoxetine (PROZAC) 20 MG capsule TAKE 1 CAPSULE EVERY DAY  . hydrOXYzine (ATARAX/VISTARIL) 10 MG tablet Take one tablet by mouth at bedtime for sleep  . loratadine (CLARITIN) 10 MG tablet Take 10 mg by mouth daily as needed for allergies.   . Multiple Vitamins-Minerals (CEROVITE SENIOR) TABS Take 1 tablet by mouth daily.  [name] omeprazole (PRILOSEC) 40 MG capsule TAKE 1 CAPSULE EVERY DAY  . OXYGEN Inhale 2 L into the lungs continuous.  . STRIVERDI RESPIMAT 2.5 MCG/ACT AERS INHALE 2 PUFFS ONE TIME DAILY AT THE SAME TIME  . verapamil (CALAN-SR) 240 MG CR tablet TAKE 1 TABLET AT BEDTIME   No facility-administered encounter medications on file as of 12/02/2020.    Allergies (verified) Fenofibrate, Pravastatin sodium, Sulfonamide derivatives, and Ciprofloxacin   History: Past Medical History:  Diagnosis Date  . Abnormality of gait 06/06/2014  . Allergic rhinitis   . Anemia   . Ankle fracture, right   . Anxiety   . Arm fracture, left   .  Arthritis    abnormal gait   . COPD (chronic obstructive pulmonary disease) (HCC)    chronic CO2 retention, decreased DLCO   . Cystic acne    adult   . 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 Degenerative disc disease, lumbar    L5 nerve impingement   . Dementia (HCC)   . Depression   . DNR (do not resuscitate)   . GERD (gastroesophageal reflux disease)   . History of colon surgery    right hemicolectomy for high-grade adenomas in right colon 2013  . Hyperglycemia   . Hypertension   . Low back pain   . Mediastinal  lymphadenopathy 1/9   resolving   . Memory difficulty 09/20/2014   mild dementia  . Onychomycosis   . OSA on CPAP   . Sleep apnea    stop bang score 6   Past Surgical History:  Procedure Laterality Date  . ABDOMINAL HYSTERECTOMY  1976   secondary to bleeding   . APPENDECTOMY    . COLON RESECTION  08/18/2012   Procedure: HAND ASSISTED LAPAROSCOPIC COLON RESECTION;  Surgeon: Dalia Heading, MD;  Location: [facility];  Service: General;  Laterality: N/A;  . COLONOSCOPY  07/20/2012   Dr. Jena Gauss: multiple colonic polyps with large polyps on right side, s/p saline-assisted debulking piecemeal polypectomy and ablation. Not all removed. Path with tubulovillous adenomas, high grade dysplasia.   . COLONOSCOPY N/A 03/09/2013   WRU:[id number] polyps-tubular adenomas. S/p right hemicolectomy. next tcs 02/2018  . epidural steroids    . ESOPHAGOGASTRODUODENOSCOPY (EGD) WITH ESOPHAGEAL DILATION N/A 02/06/2014   Procedure: ESOPHAGOGASTRODUODENOSCOPY (EGD) WITH ESOPHAGEAL DILATION;  Surgeon: Corbin Ade, MD;  Location: [facility];  Service: Endoscopy;  Laterality: N/A;  9:30  . OOPHORECTOMY  1976  . ORIF ANKLE FRACTURE  01/18/2012   Procedure: OPEN REDUCTION INTERNAL FIXATION (ORIF) ANKLE FRACTURE;  Surgeon: Fuller Canada, MD;  Location: [facility];  Service: Orthopedics;  Laterality: Left;  . ORIF ANKLE FRACTURE Right 01/13/2016   Procedure: OPEN REDUCTION INTERNAL FIXATION (ORIF) ANKLE FRACTURE;  Surgeon: Vickki Hearing, MD;  Location: [facility];  Service: Orthopedics;  Laterality: Right;   Family History  Problem Relation Age of Onset  . Stomach cancer Father   . Cancer Father   . Cancer Mother        brain   . Breast cancer Mother   . Arthritis Other    Social History   Socioeconomic History  . Marital status: Widowed    Spouse name: Not on file  . Number of children: 4  . Years of education: college 1  . Highest education level: Not on file  Occupational History  . Occupation: employed in the  tobacco industry     Employer: RETIRED  Tobacco Use  . Smoking status: Former Smoker    Packs/day: 1.50    Years: 40.00    Pack years: 60.00    Types: Cigarettes    Quit date: 06/21/2006    Years since quitting: 14.4  . Smokeless tobacco: Never Used  Vaping Use  . Vaping Use: Never used  Substance and Sexual Activity  . Alcohol use: No  . Drug use: No  . Sexual activity: Never  Other Topics Concern  . Not on file  Social History Narrative   Lives at home alone   Right-handed   Drinks 2 or more cups of coffee each morning   Social Determinants of Health   Financial Resource Strain: Low  Risk   . Difficulty of Paying Living Expenses: Not hard at all  Food Insecurity: No Food Insecurity  . Worried About Programme researcher, broadcasting/film/video in the Last Year: Never true  . Ran Out of Food in the Last Year: Never true  Transportation Needs: No Transportation Needs  . Lack of Transportation (Medical): No  . Lack of Transportation (Non-Medical): No  Physical Activity: Inactive  . Days of Exercise per Week: 0 days  . Minutes of Exercise per Session: 0 min  Stress: No Stress Concern Present  . Feeling of Stress : Not at all  Social Connections: Socially Isolated  . Frequency of Communication with Friends and Family: Three times a week  . Frequency of Social Gatherings with Friends and Family: Once a week  . Attends Religious Services: Never  . Active Member of Clubs or Organizations: No  . Attends Banker Meetings: Never  . Marital Status: Widowed    Tobacco Counseling Counseling given: Yes   Clinical Intake:  Pre-visit preparation completed: Yes  Pain : No/denies pain Pain Score: 0-No pain     BMI - recorded: 24.4 Nutritional Status: BMI of 19-24  Normal Nutritional Risks: None Diabetes: No  How often do you need to have someone help you when you read instructions, pamphlets, or other written materials from your doctor or pharmacy?: 1 - Never What is the last grade  level you completed in school?: college  Diabetic? no  Interpreter Needed?: No  Information entered by :: Jerilynn Mages, LPN   Activities of Daily Living In your present state of health, do you have any difficulty performing the following activities: 12/02/2020 07/14/2020  Hearing? N N  Vision? N N  Difficulty concentrating or making decisions? Malvin Johns  Walking or climbing stairs? Y Y  Dressing or bathing? Y Y  Doing errands, shopping? Malvin Johns  Preparing Food and eating ? Y -  Using the Toilet? Y -  In the past six months, have you accidently leaked urine? N -  Do you have problems with loss of bowel control? N -  Managing your Medications? Y -  Managing your Finances? Y -  Housekeeping or managing your Housekeeping? Y -  Some recent data might be hidden    Patient Care Team: Kerri Perches, MD as PCP - General Wendall Stade, MD as Attending Physician (Cardiology) York Spaniel, MD as Consulting Physician (Neurology) Kari Baars, MD as Consulting Physician (Pulmonary Disease) Vickki Hearing, MD as Consulting Physician (Orthopedic Surgery)  Indicate any recent Medical Services you may have received from other than [facility] providers in the past year (date may be approximate).     Assessment:   This is a routine wellness examination for Angenette.  Hearing/Vision screen No exam data present  Dietary issues and exercise activities discussed: Current Exercise Habits: Home exercise routine  Goals    . Exercise 3x per week (30 min per time)     Starting 11/15/2016 patient would like to start chair exercises 3 times a week at 30 minutes a time.      Depression Screen PHQ 2/9 Scores 12/02/2020 07/14/2020 12/18/2019 07/10/2019 02/27/2019 12/13/2018 04/11/2018  PHQ - 2 Score 0 0 0 0 0 0 0  PHQ- 9 Score - - - - - - -    Fall Risk Fall Risk  12/02/2020 07/14/2020 07/10/2019 02/27/2019 12/30/2018  Falls in the past year? 0 1 1 1  -  Number falls in past yr: 0 0 1  1 1  Injury with  Fall? 0 1 1 0 1  Risk Factor Category  - - - - -  Risk for fall due to : No Fall Risks History of fall(s);Impaired balance/gait;Impaired mobility - Orthopedic patient;Impaired mobility History of fall(s);Impaired balance/gait;Impaired mobility  Follow up Falls evaluation completed Falls evaluation completed;Education provided;Falls prevention discussed - - Falls evaluation completed;Education provided;Falls prevention discussed    FALL RISK PREVENTION PERTAINING TO THE HOME:  Any stairs in or around the home? No  If so, are there any without handrails? No  Home free of loose throw rugs in walkways, pet beds, electrical cords, etc? Yes  Adequate lighting in your home to reduce risk of falls? Yes   ASSISTIVE DEVICES UTILIZED TO PREVENT FALLS:  Life alert? Yes  Use of a cane, walker or w/c? Yes  Grab bars in the bathroom? Yes  Shower chair or bench in shower? Yes  Elevated toilet seat or a handicapped toilet? Yes   TIMED UP AND GO:  Was the test performed? No .  Length of time to ambulate n/a    Cognitive Function: MMSE - Mini Mental State Exam 03/10/2017 04/21/2016 10/21/2015 09/20/2014  Orientation to time 3 5 3 4   Orientation to Place 5 4 3 3   Registration 3 3 3 3   Attention/ Calculation 4 4 2 5   Recall 1 2 0 2  Language- name 2 objects 2 2 2 2   Language- repeat 1 1 1 1   Language- follow 3 step command 3 3 3 3   Language- read & follow direction 1 1 1 1   Write a sentence 1 1 1 1   Copy design 1 1 1 1   Total score 25 27 20 26      6CIT Screen 02/27/2019 02/20/2018 11/10/2016  What Year? 0 points 0 points 0 points  What month? 0 points 0 points 0 points  What time? 0 points 0 points 0 points  Count back from 20 4 points 0 points 0 points  Months in reverse 2 points 0 points 0 points  Repeat phrase 10 points 4 points 0 points  Total Score 16 4 0    Immunizations Immunization History  Administered Date(s) Administered  . Fluad Quad(high Dose 65+) 07/10/2019, 08/22/2020  .  Influenza Split 08/25/2011, 07/25/2012  . Influenza Whole 09/12/2006, 08/25/2007, 08/01/2008, 08/20/2009, 08/20/2010  . Influenza, High Dose Seasonal PF 12/13/2018  . Influenza,inj,Quad PF,6+ Mos 08/21/2013, 09/04/2014, 08/13/2015, 07/08/2016, 09/23/2017  . Moderna Sars-Covid-2 Vaccination 01/10/2020, 02/08/2020, 09/13/2020  . Pneumococcal Conjugate-13 06/20/2014  . Pneumococcal Polysaccharide-23 09/12/2006, 08/20/2010, 04/16/2011  . Td 08/20/2010  . Zoster 12/26/2013    TDAP status: Up to date  Flu Vaccine status: Up to date  Pneumococcal vaccine status: Up to date  Covid-19 vaccine status: Completed vaccines  Qualifies for Shingles Vaccine? Yes   Zostavax completed No   Shingrix Completed?: No.    Education has been provided regarding the importance of this vaccine. Patient has been advised to call insurance company to determine out of pocket expense if they have not yet received this vaccine. Advised may also receive vaccine at local pharmacy or Health Dept. Verbalized acceptance and understanding.  Screening Tests Health Maintenance  Topic Date Due  . Hepatitis C Screening  Never done  . COLONOSCOPY (Pts 45-21yrs Insurance coverage will need to be confirmed)  03/09/2018  . TETANUS/TDAP  08/20/2020  . INFLUENZA VACCINE  Completed  . DEXA SCAN  Completed  . COVID-19 Vaccine  Completed  . PNA vac Low Risk  Adult  Completed    Health Maintenance  Health Maintenance Due  Topic Date Due  . Hepatitis C Screening  Never done  . COLONOSCOPY (Pts 45-60yrs Insurance coverage will need to be confirmed)  03/09/2018  . TETANUS/TDAP  08/20/2020    Colorectal cancer screening: Type of screening: Colonoscopy. Completed 03/09/18. Repeat every 10 years  Mammogram: Declined  Bone Density status: Completed 08/02/20. Results reflect: Bone density results: NORMAL. Repeat every 5 years.  Lung Cancer Screening: (Low Dose CT Chest recommended if Age 64-80 years, 30 pack-year currently  smoking OR have quit w/in 15years.) does not qualify.   Lung Cancer Screening Referral: n/a  Additional Screening:  Hepatitis C Screening: does not qualify  Vision Screening: Recommended annual ophthalmology exams for early detection of glaucoma and other disorders of the eye. Is the patient up to date with their annual eye exam?  Yes  Who is the provider or what is the name of the office in which the patient attends annual eye exams? [facility] If pt is not established with a provider, would they like to be referred to a provider to establish care? No .   Dental Screening: Recommended annual dental exams for proper oral hygiene  Community Resource Referral / Chronic Care Management: CRR required this visit?  No   CCM required this visit?  No      Plan:    1. Encounter for Medicare annual wellness exam   I have personally reviewed and noted the following in the patient's chart:   . Medical and social history . Use of alcohol, tobacco or illicit drugs  . Current medications and supplements . Functional ability and status . Nutritional status . Physical activity . Advanced directives . List of other physicians . Hospitalizations, surgeries, and ER visits in previous 12 months . Vitals . Screenings to include cognitive, depression, and falls . Referrals and appointments  In addition, I have reviewed and discussed with patient certain preventive protocols, quality metrics, and best practice recommendations. A written personalized care plan for preventive services as well as general preventive health recommendations were provided to patient.    Agreed with the above documentation   Freddy Finner, NP   12/02/2020   Nurse Notes: AWV conducted by nurse off site by phone. Patient and patient daughter gave consent to telehealth visit via audio. Patient and patient daughter at patient home at time of visit. Provider off site. Visit took 35 minutes to complete.

## 2020-12-02 NOTE — Patient Instructions (Signed)
[name] , Thank you for taking time to come for your Medicare Wellness Visit. I appreciate your ongoing commitment to your health goals. Please review the following plan we discussed and let me know if I can assist you in the future.   Screening recommendations/referrals: Colonoscopy: 03/09/28 Mammogram: Declined Bone Density: Complete Recommended yearly ophthalmology/optometry visit for glaucoma screening and checkup Recommended yearly dental visit for hygiene and checkup  Vaccinations: Influenza vaccine: Fall 2022 Pneumococcal vaccine: Complete Tdap vaccine: Due Shingles vaccine: Declined  Advanced directives: No  Conditions/risks identified: None  Next appointment: 01/06/21 @ 10:40 am   Preventive Care 65 Years and Older, Female Preventive care refers to lifestyle choices and visits with your health care provider that can promote health and wellness. What does preventive care include?  A yearly physical exam. This is also called an annual well check.  Dental exams once or twice a year.  Routine eye exams. Ask your health care provider how often you should have your eyes checked.  Personal lifestyle choices, including:  Daily care of your teeth and gums.  Regular physical activity.  Eating a healthy diet.  Avoiding tobacco and drug use.  Limiting alcohol use.  Practicing safe sex.  Taking low-dose aspirin every day.  Taking vitamin and mineral supplements as recommended by your health care provider. What happens during an annual well check? The services and screenings done by your health care provider during your annual well check will depend on your age, overall health, lifestyle risk factors, and family history of disease. Counseling  Your health care provider may ask you questions about your:  Alcohol use.  Tobacco use.  Drug use.  Emotional well-being.  Home and relationship well-being.  Sexual activity.  Eating habits.  History of  falls.  Memory and ability to understand (cognition).  Work and work Statistician.  Reproductive health. Screening  You may have the following tests or measurements:  Height, weight, and BMI.  Blood pressure.  Lipid and cholesterol levels. These may be checked every 5 years, or more frequently if you are over 70 years old.  Skin check.  Lung cancer screening. You may have this screening every year starting at age 85 if you have a 30-pack-year history of smoking and currently smoke or have quit within the past 15 years.  Fecal occult blood test (FOBT) of the stool. You may have this test every year starting at age 42.  Flexible sigmoidoscopy or colonoscopy. You may have a sigmoidoscopy every 5 years or a colonoscopy every 10 years starting at age 106.  Hepatitis C blood test.  Hepatitis B blood test.  Sexually transmitted disease (STD) testing.  Diabetes screening. This is done by checking your blood sugar (glucose) after you have not eaten for a while (fasting). You may have this done every 1-3 years.  Bone density scan. This is done to screen for osteoporosis. You may have this done starting at age 77.  Mammogram. This may be done every 1-2 years. Talk to your health care provider about how often you should have regular mammograms. Talk with your health care provider about your test results, treatment options, and if necessary, the need for more tests. Vaccines  Your health care provider may recommend certain vaccines, such as:  Influenza vaccine. This is recommended every year.  Tetanus, diphtheria, and acellular pertussis (Tdap, Td) vaccine. You may need a Td booster every 10 years.  Zoster vaccine. You may need this after age 36.  Pneumococcal 13-valent conjugate (PCV13)  vaccine. One dose is recommended after age 86.  Pneumococcal polysaccharide (PPSV23) vaccine. One dose is recommended after age 63. Talk to your health care provider about which screenings and  vaccines you need and how often you need them. This information is not intended to replace advice given to you by your health care provider. Make sure you discuss any questions you have with your health care provider. Document Released: 11/28/2015 Document Revised: 07/21/2016 Document Reviewed: 09/02/2015 Elsevier Interactive Patient Education  2017 [place] Prevention in the Home Falls can cause injuries. They can happen to people of all ages. There are many things you can do to make your home safe and to help prevent falls. What can I do on the outside of my home?  Regularly fix the edges of walkways and driveways and fix any cracks.  Remove anything that might make you trip as you walk through a door, such as a raised step or threshold.  Trim any bushes or trees on the path to your home.  Use bright outdoor lighting.  Clear any walking paths of anything that might make someone trip, such as rocks or tools.  Regularly check to see if handrails are loose or broken. Make sure that both sides of any steps have handrails.  Any raised decks and porches should have guardrails on the edges.  Have any leaves, snow, or ice cleared regularly.  Use sand or salt on walking paths during winter.  Clean up any spills in your garage right away. This includes oil or grease spills. What can I do in the bathroom?  Use night lights.  Install grab bars by the toilet and in the tub and shower. Do not use towel bars as grab bars.  Use non-skid mats or decals in the tub or shower.  If you need to sit down in the shower, use a plastic, non-slip stool.  Keep the floor dry. Clean up any water that spills on the floor as soon as it happens.  Remove soap buildup in the tub or shower regularly.  Attach bath mats securely with double-sided non-slip rug tape.  Do not have throw rugs and other things on the floor that can make you trip. What can I do in the bedroom?  Use night  lights.  Make sure that you have a light by your bed that is easy to reach.  Do not use any sheets or blankets that are too big for your bed. They should not hang down onto the floor.  Have a firm chair that has side arms. You can use this for support while you get dressed.  Do not have throw rugs and other things on the floor that can make you trip. What can I do in the kitchen?  Clean up any spills right away.  Avoid walking on wet floors.  Keep items that you use a lot in easy-to-reach places.  If you need to reach something above you, use a strong step stool that has a grab bar.  Keep electrical cords out of the way.  Do not use floor polish or wax that makes floors slippery. If you must use wax, use non-skid floor wax.  Do not have throw rugs and other things on the floor that can make you trip. What can I do with my stairs?  Do not leave any items on the stairs.  Make sure that there are handrails on both sides of the stairs and use them. Fix handrails that are  broken or loose. Make sure that handrails are as long as the stairways.  Check any carpeting to make sure that it is firmly attached to the stairs. Fix any carpet that is loose or worn.  Avoid having throw rugs at the top or bottom of the stairs. If you do have throw rugs, attach them to the floor with carpet tape.  Make sure that you have a light switch at the top of the stairs and the bottom of the stairs. If you do not have them, ask someone to add them for you. What else can I do to help prevent falls?  Wear shoes that:  Do not have high heels.  Have rubber bottoms.  Are comfortable and fit you well.  Are closed at the toe. Do not wear sandals.  If you use a stepladder:  Make sure that it is fully opened. Do not climb a closed stepladder.  Make sure that both sides of the stepladder are locked into place.  Ask someone to hold it for you, if possible.  Clearly mark and make sure that you can  see:  Any grab bars or handrails.  First and last steps.  Where the edge of each step is.  Use tools that help you move around (mobility aids) if they are needed. These include:  Canes.  Walkers.  Scooters.  Crutches.  Turn on the lights when you go into a dark area. Replace any light bulbs as soon as they burn out.  Set up your furniture so you have a clear path. Avoid moving your furniture around.  If any of your floors are uneven, fix them.  If there are any pets around you, be aware of where they are.  Review your medicines with your doctor. Some medicines can make you feel dizzy. This can increase your chance of falling. Ask your doctor what other things that you can do to help prevent falls. This information is not intended to replace advice given to you by your health care provider. Make sure you discuss any questions you have with your health care provider. Document Released: 08/28/2009 Document Revised: 04/08/2016 Document Reviewed: 12/06/2014 Elsevier Interactive Patient Education  2017 [employer].

## 2020-12-11 DIAGNOSIS — J449 Chronic obstructive pulmonary disease, unspecified: Secondary | ICD-10-CM | POA: Diagnosis not present

## 2020-12-19 ENCOUNTER — Other Ambulatory Visit: Payer: Self-pay | Admitting: *Deleted

## 2020-12-19 MED ORDER — AMLODIPINE BESYLATE 2.5 MG PO TABS: 2.5000 mg | ORAL_TABLET | Freq: Every day | ORAL | 1 refills | Status: DC

## 2021-01-06 ENCOUNTER — Encounter: Payer: Self-pay | Admitting: Internal Medicine

## 2021-01-06 ENCOUNTER — Other Ambulatory Visit: Payer: Self-pay | Admitting: Family Medicine

## 2021-01-06 ENCOUNTER — Ambulatory Visit (INDEPENDENT_AMBULATORY_CARE_PROVIDER_SITE_OTHER): Payer: Medicare PPO | Admitting: Internal Medicine

## 2021-01-06 ENCOUNTER — Encounter: Payer: Medicare PPO | Admitting: Family Medicine

## 2021-01-06 ENCOUNTER — Other Ambulatory Visit: Payer: Self-pay

## 2021-01-06 VITALS — BP 120/73 | HR 86 | Temp 97.8°F | Ht 73.0 in | Wt 158.0 lb

## 2021-01-06 DIAGNOSIS — I10 Essential (primary) hypertension: Secondary | ICD-10-CM | POA: Diagnosis not present

## 2021-01-06 DIAGNOSIS — F339 Major depressive disorder, recurrent, unspecified: Secondary | ICD-10-CM

## 2021-01-06 DIAGNOSIS — Z0000 Encounter for general adult medical examination without abnormal findings: Secondary | ICD-10-CM

## 2021-01-06 DIAGNOSIS — R634 Abnormal weight loss: Secondary | ICD-10-CM | POA: Diagnosis not present

## 2021-01-06 DIAGNOSIS — F0150 Vascular dementia without behavioral disturbance: Secondary | ICD-10-CM

## 2021-01-06 DIAGNOSIS — Z1231 Encounter for screening mammogram for malignant neoplasm of breast: Secondary | ICD-10-CM

## 2021-01-06 DIAGNOSIS — I248 Other forms of acute ischemic heart disease: Secondary | ICD-10-CM | POA: Diagnosis not present

## 2021-01-06 DIAGNOSIS — E785 Hyperlipidemia, unspecified: Secondary | ICD-10-CM | POA: Diagnosis not present

## 2021-01-06 MED ORDER — MIRTAZAPINE 15 MG PO TABS: 15.0000 mg | ORAL_TABLET | Freq: Every day | ORAL | 2 refills | Status: DC

## 2021-01-06 NOTE — Patient Instructions (Signed)
Please discontinue Prozac and start taking Remeron instead.  Please continue to take medications as prescribed.  Please try to take small, frequent meals. Try to give Ensure protein supplements.

## 2021-01-06 NOTE — Assessment & Plan Note (Signed)
Progressive, currently A&O X 3 On Donepezil Loss of weight and appetite likely related to progressive dementia Goals of care discussion

## 2021-01-06 NOTE — Progress Notes (Signed)
Established Patient Office Visit  Subjective:  Patient ID: [name], female    DOB: [birth year]  Age: 80 y.o. MRN: [id number]  CC:  Chief Complaint  Patient presents with   Annual Exam    CPE    HPI [name] is a 80 year old female with PMH of HTN, HLD, progressive dementia, COPD on home O2, depression and insomnia who presents for annual physical. Daughter is present during the visit.  She has been losing weight lately and has had decreased appetite. Daughter mentions that they have been trying to feed her as much as she tolerates, but she does not feel like eating. She is dependent for ADLs, although eats by herself. She denies any nausea, vomiting. She has h/o chronic loose BM since colectomy. She has tried Ensure supplements, but she does not like it.  BP is well-controlled. Takes medications regularly. Patient denies headache, dizziness, chest pain, dyspnea or palpitations.  She has been using 2 l O2 for COPD related respiratory failure.  She has been taking Prozac for depression, but still has insomnia.  Past Medical History:  Diagnosis Date   Abnormality of gait 06/06/2014   Allergic rhinitis    Anemia    Ankle fracture, right    Anxiety    Arm fracture, left    Arthritis    abnormal gait    COPD (chronic obstructive pulmonary disease) (HCC)    chronic CO2 retention, decreased DLCO    Cystic acne    adult    Degenerative disc disease, cervical    syrinx C3-7   Degenerative disc disease, lumbar    L5 nerve impingement    Dementia (HCC)    Depression    DNR (do not resuscitate)    GERD (gastroesophageal reflux disease)    Heme positive stool 10/10/2017   History of colon surgery    right hemicolectomy for high-grade adenomas in right colon 2013   Hyperglycemia    Hypertension    Low back pain    Mediastinal lymphadenopathy 1/9   resolving    Memory difficulty 09/20/2014   mild dementia   Onychomycosis    OSA  on CPAP    Sleep apnea    stop bang score 6    Past Surgical History:  Procedure Laterality Date   ABDOMINAL HYSTERECTOMY  1976   secondary to bleeding    APPENDECTOMY     COLON RESECTION  08/18/2012   Procedure: HAND ASSISTED LAPAROSCOPIC COLON RESECTION;  Surgeon: Jamesetta So, MD;  Location: [facility];  Service: General;  Laterality: N/A;   COLONOSCOPY  07/20/2012   Dr. Gala Romney: multiple colonic polyps with large polyps on right side, s/p saline-assisted debulking piecemeal polypectomy and ablation. Not all removed. Path with tubulovillous adenomas, high grade dysplasia.    COLONOSCOPY N/A 03/09/2013   FOY:DXAJOIN polyps-tubular adenomas. S/p right hemicolectomy. next tcs 02/2018   epidural steroids     ESOPHAGOGASTRODUODENOSCOPY (EGD) WITH ESOPHAGEAL DILATION N/A 02/06/2014   Procedure: ESOPHAGOGASTRODUODENOSCOPY (EGD) WITH ESOPHAGEAL DILATION;  Surgeon: Daneil Dolin, MD;  Location: [facility];  Service: Endoscopy;  Laterality: N/A;  9:30   OOPHORECTOMY  1976   ORIF ANKLE FRACTURE  01/18/2012   Procedure: OPEN REDUCTION INTERNAL FIXATION (ORIF) ANKLE FRACTURE;  Surgeon: Arther Abbott, MD;  Location: [facility];  Service: Orthopedics;  Laterality: Left;   ORIF ANKLE FRACTURE Right 01/13/2016   Procedure: OPEN REDUCTION INTERNAL FIXATION (ORIF) ANKLE FRACTURE;  Surgeon: Carole Civil, MD;  Location: [facility];  Service: Orthopedics;  Laterality: Right;    Family History  Problem Relation Age of Onset   Stomach cancer Father    Cancer Father    Cancer Mother        brain    Breast cancer Mother    Arthritis Other     Social History   Socioeconomic History   Marital status: Widowed    Spouse name: Not on file   Number of children: 4   Years of education: college 1   Highest education level: Not on file  Occupational History   Occupation: employed in the tobacco industry     Employer: RETIRED  Tobacco Use   Smoking status: Former Smoker    Packs/day:  1.50    Years: 40.00    Pack years: 60.00    Types: Cigarettes    Quit date: 06/21/2006    Years since quitting: 14.5   Smokeless tobacco: Never Used  Vaping Use   Vaping Use: Never used  Substance and Sexual Activity   Alcohol use: No   Drug use: No   Sexual activity: Never  Other Topics Concern   Not on file  Social History Narrative   Lives at home alone   Right-handed   Drinks 2 or more cups of coffee each morning   Social Determinants of Health   Financial Resource Strain: Low Risk    Difficulty of Paying Living Expenses: Not hard at all  Food Insecurity: No Food Insecurity   Worried About Charity fundraiser in the Last Year: Never true   Arboriculturist in the Last Year: Never true  Transportation Needs: No Transportation Needs   Lack of Transportation (Medical): No   Lack of Transportation (Non-Medical): No  Physical Activity: Inactive   Days of Exercise per Week: 0 days   Minutes of Exercise per Session: 0 min  Stress: No Stress Concern Present   Feeling of Stress : Not at all  Social Connections: Socially Isolated   Frequency of Communication with Friends and Family: Three times a week   Frequency of Social Gatherings with Friends and Family: Once a week   Attends Religious Services: Never   Marine scientist or Organizations: No   Attends Archivist Meetings: Never   Marital Status: Widowed  Human resources officer Violence: Not At Risk   Fear of Current or Ex-Partner: No   Emotionally Abused: No   Physically Abused: No   Sexually Abused: No    Outpatient Medications Prior to Visit  Medication Sig Dispense Refill   acetaminophen (TYLENOL) 650 MG CR tablet Take 650 mg by mouth every 8 (eight) hours as needed for pain.     albuterol (VENTOLIN HFA) 108 (90 Base) MCG/ACT inhaler Inhale 2 puffs into the lungs every 6 (six) hours as needed for wheezing or shortness of breath. 18 g 3   alendronate (FOSAMAX) 70 MG tablet TAKE 1  TABLET ONE TIME A WEEK. TAKE WITH A FULL GLASS OF WATER ON AN EMPTY STOMACH. 12 tablet 1   amLODipine (NORVASC) 2.5 MG tablet Take 1 tablet (2.5 mg total) by mouth daily. 90 tablet 1   atorvastatin (LIPITOR) 10 MG tablet TAKE 1 TABLET EVERY DAY 90 tablet 3   calcium-vitamin D (OSCAL WITH D) 500-200 MG-UNIT per tablet Take 1 tablet by mouth daily.     donepezil (ARICEPT) 10 MG tablet TAKE 1 TABLET AT BEDTIME 90 tablet 1   hydrOXYzine (ATARAX/VISTARIL) 10 MG tablet Take one tablet  by mouth at bedtime for sleep 30 tablet 2   loratadine (CLARITIN) 10 MG tablet Take 10 mg by mouth daily as needed for allergies.      Multiple Vitamins-Minerals (CEROVITE SENIOR) TABS Take 1 tablet by mouth daily.     omeprazole (PRILOSEC) 40 MG capsule TAKE 1 CAPSULE EVERY DAY 90 capsule 1   OXYGEN Inhale 2 L into the lungs continuous.     STRIVERDI RESPIMAT 2.5 MCG/ACT AERS INHALE 2 PUFFS ONE TIME DAILY AT THE SAME TIME 12 g 2   verapamil (CALAN-SR) 240 MG CR tablet TAKE 1 TABLET AT BEDTIME 90 tablet 1   FLUoxetine (PROZAC) 20 MG capsule TAKE 1 CAPSULE EVERY DAY 90 capsule 1   No facility-administered medications prior to visit.    Allergies  Allergen Reactions   Fenofibrate Nausea And Vomiting   Pravastatin Sodium Nausea And Vomiting   Sulfonamide Derivatives Other (See Comments)    unknown   Ciprofloxacin Rash    ROS Review of Systems  Constitutional: Positive for appetite change and unexpected weight change. Negative for chills and fever.  HENT: Negative for congestion, sinus pressure, sinus pain and sore throat.   Eyes: Negative for pain and discharge.  Respiratory: Negative for cough and shortness of breath.   Cardiovascular: Negative for chest pain and palpitations.  Gastrointestinal: Negative for abdominal pain, constipation, diarrhea, nausea and vomiting.  Endocrine: Negative for polydipsia and polyuria.  Genitourinary: Negative for dysuria and hematuria.  Musculoskeletal:  Negative for neck pain and neck stiffness.  Skin: Negative for rash.  Neurological: Negative for dizziness and weakness.  Psychiatric/Behavioral: Positive for sleep disturbance. Negative for agitation and behavioral problems.      Objective:    Physical Exam Vitals reviewed.  Constitutional:      General: She is not in acute distress.    Appearance: She is not diaphoretic.     Comments: In wheelchair  HENT:     Head: Normocephalic and atraumatic.     Nose: Nose normal. No congestion.     Mouth/Throat:     Mouth: Mucous membranes are moist.     Pharynx: No posterior oropharyngeal erythema.  Eyes:     General: No scleral icterus.    Extraocular Movements: Extraocular movements intact.     Pupils: Pupils are equal, round, and reactive to light.  Cardiovascular:     Rate and Rhythm: Normal rate and regular rhythm.     Pulses: Normal pulses.     Heart sounds: Normal heart sounds. No murmur heard.   Pulmonary:     Breath sounds: Normal breath sounds. No wheezing or rales.     Comments: On 2 l O2 Abdominal:     Palpations: Abdomen is soft.     Tenderness: There is no abdominal tenderness.  Musculoskeletal:     Cervical back: Neck supple. No tenderness.     Right lower leg: No edema.     Left lower leg: No edema.  Skin:    General: Skin is warm.     Findings: No rash.  Neurological:     General: No focal deficit present.     Mental Status: She is alert and oriented to person, place, and time.     Sensory: No sensory deficit.     Motor: No weakness.  Psychiatric:        Mood and Affect: Mood normal.        Behavior: Behavior normal.     BP 120/73 (BP Location: Right Arm, Patient Position:  Sitting, Cuff Size: Normal)    Pulse 86    Temp 97.8 F (36.6 C) (Temporal)    Ht '6\' 1"'  (1.854 m)    Wt 158 lb (71.7 kg)    SpO2 93%    BMI 20.85 kg/m  Wt Readings from Last 3 Encounters:  01/06/21 158 lb (71.7 kg)  12/02/20 185 lb (83.9 kg)  11/18/20 193 lb (87.5 kg)      Health Maintenance Due  Topic Date Due   Hepatitis C Screening  Never done   COLONOSCOPY (Pts 45-25yr Insurance coverage will need to be confirmed)  03/09/2018   TETANUS/TDAP  08/20/2020    There are no preventive care reminders to display for this patient.  Lab Results  Component Value Date   TSH 0.336 (L) 07/14/2020   Lab Results  Component Value Date   WBC 6.1 12/18/2019   HGB 11.7 12/18/2019   HCT 37.9 12/18/2019   MCV 83.3 12/18/2019   PLT 316 12/18/2019   Lab Results  Component Value Date   NA 144 07/14/2020   K 4.0 07/14/2020   CO2 27 07/14/2020   GLUCOSE 82 07/14/2020   BUN 18 07/14/2020   CREATININE 0.81 07/14/2020   BILITOT 0.3 07/14/2020   ALKPHOS 97 07/14/2020   AST 15 07/14/2020   ALT 12 07/14/2020   PROT 6.2 07/14/2020   ALBUMIN 3.6 (L) 07/14/2020   CALCIUM 9.4 07/14/2020   ANIONGAP 5 01/30/2017   Lab Results  Component Value Date   CHOL 135 07/14/2020   Lab Results  Component Value Date   HDL 62 07/14/2020   Lab Results  Component Value Date   LDLCALC 55 07/14/2020   Lab Results  Component Value Date   TRIG 99 07/14/2020   Lab Results  Component Value Date   CHOLHDL 2.2 07/14/2020   Lab Results  Component Value Date   HGBA1C 5.8 01/23/2010      Assessment & Plan:   Problem List Items Addressed This Visit      Annual physical exam     Annual exam as documented. Counseling done  re healthy lifestyle involving commitment to 150 minutes exercise per week, heart healthy diet, and attaining healthy weight.The importance of adequate sleep also discussed. Changes in health habits are decided on by the patient with goals and time frames  set for achieving them. Immunization and cancer screening needs are specifically addressed at this visit.  Relevant Orders  CBC  CMP14+EGFR  Lipid panel  TSH + free T4  Vitamin D (25 hydroxy)  Depression, recurrent (HCC)     On Prozac Changed it to Remeron mainly to address weight and  sleep  Relevant Medications  mirtazapine (REMERON) 15 MG tablet   Cardiovascular and Mediastinum   Essential hypertension    BP Readings from Last 1 Encounters:  01/06/21 120/73   Well-controlled with Amlodipine Counseled for compliance with the medications        Nervous and Auditory   Dementia ([place] - Primary    Progressive, currently A&O X 3 On Donepezil Loss of weight and appetite likely related to progressive dementia Goals of care discussion      Relevant Medications   mirtazapine (REMERON) 15 MG tablet    Other Visit Diagnoses       Weight loss       Relevant Medications   mirtazapine (REMERON) 15 MG tablet      Meds ordered this encounter  Medications   mirtazapine (REMERON) 15 MG tablet  Sig: Take 1 tablet (15 mg total) by mouth at bedtime.    Dispense:  30 tablet    Refill:  2    Follow-up: Return in about 3 months (around 04/05/2021) for Weight check and depression.    Lindell Spar, MD

## 2021-01-06 NOTE — Assessment & Plan Note (Signed)
BP Readings from Last 1 Encounters:  01/06/21 120/73   Well-controlled with Amlodipine Counseled for compliance with the medications

## 2021-01-07 LAB — CMP14+EGFR
ALT: 13 IU/L (ref 0–32)
AST: 20 IU/L (ref 0–40)
Albumin/Globulin Ratio: 1.5 (ref 1.2–2.2)
Albumin: 3.7 g/dL (ref 3.7–4.7)
Alkaline Phosphatase: 83 IU/L (ref 44–121)
BUN/Creatinine Ratio: 19 (ref 12–28)
BUN: 24 mg/dL (ref 8–27)
Bilirubin Total: 0.2 mg/dL (ref 0.0–1.2)
CO2: 24 mmol/L (ref 20–29)
Calcium: 8.9 mg/dL (ref 8.7–10.3)
Chloride: 107 mmol/L — ABNORMAL HIGH (ref 96–106)
Creatinine, Ser: 1.25 mg/dL — ABNORMAL HIGH (ref 0.57–1.00)
GFR calc Af Amer: 47 mL/min/{1.73_m2} — ABNORMAL LOW (ref >59)
GFR calc non Af Amer: 41 mL/min/{1.73_m2} — ABNORMAL LOW (ref >59)
Globulin, Total: 2.4 g/dL (ref 1.5–4.5)
Glucose: 150 mg/dL — ABNORMAL HIGH (ref 65–99)
Potassium: 3.2 mmol/L — ABNORMAL LOW (ref 3.5–5.2)
Sodium: 148 mmol/L — ABNORMAL HIGH (ref 134–144)
Total Protein: 6.1 g/dL (ref 6.0–8.5)

## 2021-01-07 LAB — LIPID PANEL
Chol/HDL Ratio: 2.2 ratio (ref 0.0–4.4)
Cholesterol, Total: 152 mg/dL (ref 100–199)
HDL: 69 mg/dL (ref >39)
LDL Chol Calc (NIH): 61 mg/dL (ref 0–99)
Triglycerides: 125 mg/dL (ref 0–149)
VLDL Cholesterol Cal: 22 mg/dL (ref 5–40)

## 2021-01-07 LAB — CBC
Hematocrit: 37.1 % (ref 34.0–46.6)
Hemoglobin: 11.3 g/dL (ref 11.1–15.9)
MCH: 26.7 pg (ref 26.6–33.0)
MCHC: 30.5 g/dL — ABNORMAL LOW (ref 31.5–35.7)
MCV: 88 fL (ref 79–97)
Platelets: 278 10*3/uL (ref 150–450)
RBC: 4.23 x10E6/uL (ref 3.77–5.28)
RDW: 15.5 % — ABNORMAL HIGH (ref 11.7–15.4)
WBC: 6.1 10*3/uL (ref 3.4–10.8)

## 2021-01-07 LAB — TSH+FREE T4
Free T4: 0.84 ng/dL (ref 0.82–1.77)
TSH: 0.407 u[IU]/mL — ABNORMAL LOW (ref 0.450–4.500)

## 2021-01-07 LAB — VITAMIN D 25 HYDROXY (VIT D DEFICIENCY, FRACTURES): Vit D, 25-Hydroxy: 63.3 ng/mL (ref 30.0–100.0)

## 2021-01-11 DIAGNOSIS — J449 Chronic obstructive pulmonary disease, unspecified: Secondary | ICD-10-CM | POA: Diagnosis not present

## 2021-02-08 DIAGNOSIS — J449 Chronic obstructive pulmonary disease, unspecified: Secondary | ICD-10-CM | POA: Diagnosis not present

## 2021-02-19 ENCOUNTER — Other Ambulatory Visit: Payer: Self-pay | Admitting: Family Medicine

## 2021-03-05 ENCOUNTER — Ambulatory Visit
Admission: RE | Admit: 2021-03-05 | Discharge: 2021-03-05 | Disposition: A | Discharge status: Home / Self Care | Payer: Medicare PPO | Source: Ambulatory Visit | Attending: Family Medicine | Admitting: Family Medicine

## 2021-03-05 ENCOUNTER — Other Ambulatory Visit: Payer: Self-pay

## 2021-03-05 DIAGNOSIS — Z1231 Encounter for screening mammogram for malignant neoplasm of breast: Secondary | ICD-10-CM

## 2021-03-09 ENCOUNTER — Other Ambulatory Visit: Payer: Self-pay | Admitting: Internal Medicine

## 2021-03-09 DIAGNOSIS — F339 Major depressive disorder, recurrent, unspecified: Secondary | ICD-10-CM

## 2021-03-09 DIAGNOSIS — R634 Abnormal weight loss: Secondary | ICD-10-CM

## 2021-03-11 DIAGNOSIS — J449 Chronic obstructive pulmonary disease, unspecified: Secondary | ICD-10-CM | POA: Diagnosis not present

## 2021-03-13 ENCOUNTER — Telehealth: Payer: Self-pay

## 2021-03-13 DIAGNOSIS — J449 Chronic obstructive pulmonary disease, unspecified: Secondary | ICD-10-CM | POA: Diagnosis not present

## 2021-03-13 DIAGNOSIS — Z87891 Personal history of nicotine dependence: Secondary | ICD-10-CM | POA: Diagnosis not present

## 2021-03-13 DIAGNOSIS — I10 Essential (primary) hypertension: Secondary | ICD-10-CM | POA: Diagnosis not present

## 2021-03-13 DIAGNOSIS — L03315 Cellulitis of perineum: Secondary | ICD-10-CM | POA: Diagnosis not present

## 2021-03-13 DIAGNOSIS — E785 Hyperlipidemia, unspecified: Secondary | ICD-10-CM | POA: Diagnosis not present

## 2021-03-13 DIAGNOSIS — L0390 Cellulitis, unspecified: Secondary | ICD-10-CM | POA: Diagnosis not present

## 2021-03-13 NOTE — Telephone Encounter (Signed)
FL2 form  Noted Copied Sleeved  

## 2021-03-19 DIAGNOSIS — Z0279 Encounter for issue of other medical certificate: Secondary | ICD-10-CM

## 2021-03-20 NOTE — Telephone Encounter (Signed)
Forms picked up

## 2021-03-26 ENCOUNTER — Encounter: Payer: Self-pay | Admitting: Adult Health

## 2021-03-26 ENCOUNTER — Non-Acute Institutional Stay (SKILLED_NURSING_FACILITY): Payer: Medicare PPO | Admitting: Adult Health

## 2021-03-26 ENCOUNTER — Inpatient Hospital Stay
Admission: RE | Admit: 2021-03-26 | Discharge: 2022-01-06 | Disposition: A | Discharge status: Nursing Home (Includes ICF) | Payer: Medicare PPO | Source: Ambulatory Visit | Attending: Internal Medicine | Admitting: Internal Medicine

## 2021-03-26 DIAGNOSIS — F0150 Vascular dementia without behavioral disturbance: Secondary | ICD-10-CM

## 2021-03-26 DIAGNOSIS — D649 Anemia, unspecified: Secondary | ICD-10-CM | POA: Diagnosis not present

## 2021-03-26 DIAGNOSIS — K219 Gastro-esophageal reflux disease without esophagitis: Secondary | ICD-10-CM

## 2021-03-26 DIAGNOSIS — R7989 Other specified abnormal findings of blood chemistry: Secondary | ICD-10-CM

## 2021-03-26 DIAGNOSIS — M159 Polyosteoarthritis, unspecified: Secondary | ICD-10-CM | POA: Diagnosis not present

## 2021-03-26 DIAGNOSIS — M810 Age-related osteoporosis without current pathological fracture: Secondary | ICD-10-CM | POA: Diagnosis not present

## 2021-03-26 DIAGNOSIS — F5104 Psychophysiologic insomnia: Secondary | ICD-10-CM

## 2021-03-26 DIAGNOSIS — F324 Major depressive disorder, single episode, in partial remission: Secondary | ICD-10-CM

## 2021-03-26 DIAGNOSIS — I10 Essential (primary) hypertension: Secondary | ICD-10-CM

## 2021-03-26 DIAGNOSIS — J449 Chronic obstructive pulmonary disease, unspecified: Secondary | ICD-10-CM

## 2021-03-26 DIAGNOSIS — E785 Hyperlipidemia, unspecified: Secondary | ICD-10-CM

## 2021-03-26 DIAGNOSIS — N1831 Chronic kidney disease, stage 3a: Secondary | ICD-10-CM | POA: Diagnosis not present

## 2021-03-26 DIAGNOSIS — R0902 Hypoxemia: Secondary | ICD-10-CM

## 2021-03-26 DIAGNOSIS — M7989 Other specified soft tissue disorders: Principal | ICD-10-CM

## 2021-03-26 NOTE — Progress Notes (Signed)
Location:  [place] Room Number: 149-W Place of Service:  SNF (31)   CODE STATUS: DNR  Allergies  Allergen Reactions  . Fenofibrate Nausea And Vomiting  . Pravastatin Sodium Nausea And Vomiting  . Sulfonamide Derivatives Other (See Comments)    unknown  . Ciprofloxacin Rash    Chief Complaint  Patient presents with  . Medical Management of Chronic Issues    Follow-up transfer    HPI:  She is a 80 year old woman who is being transferred to this facility for long term placement . Her medical history includes: hypertension; copd; dementia.   Her adl needs are unable to be made at home. She denies any pain; tells me that she is feeling good. She is wanting to exercise to get better and "go home". Her family is present stating that this is a long term placement.  She will continue to be followed for her chronic illnesses including: COPD with hypoxia:     Essential hypertension:    Esophageal reflux disease without esophagitis:  Past Medical History:  Diagnosis Date  . Abnormality of gait 06/06/2014  . Allergic rhinitis   . Anemia   . Ankle fracture, right   . Anxiety   . Arm fracture, left   . Arthritis    abnormal gait   . COPD (chronic obstructive pulmonary disease) (HCC)    chronic CO2 retention, decreased DLCO   . Cystic acne    adult   . 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 Degenerative disc disease, lumbar    L5 nerve impingement   . Dementia ([place])   . Depression   . DNR (do not resuscitate)   . GERD (gastroesophageal reflux disease)   . Heme positive stool 10/10/2017  . History of colon surgery    right hemicolectomy for high-grade adenomas in right colon 2013  . Hyperglycemia   . Hypertension   . Low back pain   . Mediastinal lymphadenopathy 1/9   resolving   . Memory difficulty 09/20/2014   mild dementia  . Onychomycosis   . OSA on CPAP   . Sleep apnea    stop bang score 6    Past Surgical History:   Procedure Laterality Date  . ABDOMINAL HYSTERECTOMY  1976   secondary to bleeding   . APPENDECTOMY    . COLON RESECTION  08/18/2012   Procedure: HAND ASSISTED LAPAROSCOPIC COLON RESECTION;  Surgeon: Jamesetta So, MD;  Location: [facility];  Service: General;  Laterality: N/A;  . COLONOSCOPY  07/20/2012   Dr. Gala Romney: multiple colonic polyps with large polyps on right side, s/p saline-assisted debulking piecemeal polypectomy and ablation. Not all removed. Path with tubulovillous adenomas, high grade dysplasia.   . COLONOSCOPY N/A 03/09/2013   JYN:WGNFAOZ polyps-tubular adenomas. S/p right hemicolectomy. next tcs 02/2018  . epidural steroids    . ESOPHAGOGASTRODUODENOSCOPY (EGD) WITH ESOPHAGEAL DILATION N/A 02/06/2014   Procedure: ESOPHAGOGASTRODUODENOSCOPY (EGD) WITH ESOPHAGEAL DILATION;  Surgeon: Daneil Dolin, MD;  [facility];  Service: Endoscopy;  Laterality: N/A;  9:30  . OOPHORECTOMY  1976  . ORIF ANKLE FRACTURE  01/18/2012   Procedure: OPEN REDUCTION INTERNAL FIXATION (ORIF) ANKLE FRACTURE;  Surgeon: Arther Abbott, MD;  Location: [facility];  Service: Orthopedics;  Laterality: Left;  . ORIF ANKLE FRACTURE Right 01/13/2016   Procedure: OPEN REDUCTION INTERNAL FIXATION (ORIF) ANKLE FRACTURE;  Surgeon: Carole Civil, MD;  Location: [facility];  Service: Orthopedics;  Laterality: Right;  Social History   Socioeconomic History  . Marital status: Widowed    Spouse name: Not on file  . Number of children: 4  . Years of education: college 1  . Highest education level: Not on file  Occupational History  . Occupation: employed in the tobacco industry     Employer: RETIRED  Tobacco Use  . Smoking status: Former Smoker    Packs/day: 1.50    Years: 40.00    Pack years: 60.00    Types: Cigarettes    Quit date: 06/21/2006    Years since quitting: 14.7  . Smokeless tobacco: Never Used  Vaping Use  . Vaping Use: Never used  Substance and Sexual Activity  . Alcohol use: No  . Drug  use: No  . Sexual activity: Never  Other Topics Concern  . Not on file  Social History Narrative   Lives at home alone   Right-handed   Drinks 2 or more cups of coffee each morning   Social Determinants of Health   Financial Resource Strain: Low Risk   . Difficulty of Paying Living Expenses: Not hard at all  Food Insecurity: No Food Insecurity  . Worried About Charity fundraiser in the Last Year: Never true  . Ran Out of Food in the Last Year: Never true  Transportation Needs: No Transportation Needs  . Lack of Transportation (Medical): No  . Lack of Transportation (Non-Medical): No  Physical Activity: Inactive  . Days of Exercise per Week: 0 days  . Minutes of Exercise per Session: 0 min  Stress: No Stress Concern Present  . Feeling of Stress : Not at all  Social Connections: Socially Isolated  . Frequency of Communication with Friends and Family: Three times a week  . Frequency of Social Gatherings with Friends and Family: Once a week  . Attends Religious Services: Never  . Active Member of Clubs or Organizations: No  . Attends Archivist Meetings: Never  . Marital Status: Widowed  Intimate Partner Violence: Not At Risk  . Fear of Current or Ex-Partner: No  . Emotionally Abused: No  . Physically Abused: No  . Sexually Abused: No   Family History  Problem Relation Age of Onset  . Stomach cancer Father   . Cancer Father   . Cancer Mother        brain   . Breast cancer Mother   . Arthritis Other       VITAL SIGNS BP (!) 155/67   Pulse 90   Temp 98.3 F (36.8 C)   Resp 18   Ht 6\' 1"  (1.854 m)   Wt 236 lb 14.4 oz (107.5 kg)   SpO2 95%   BMI 31.26 kg/m   Outpatient Encounter Medications as of 03/26/2021  Medication Sig  . acetaminophen (TYLENOL) 650 MG CR tablet Take 650 mg by mouth every 8 (eight) hours as needed for pain.  [name] albuterol (VENTOLIN HFA) 108 (90 Base) MCG/ACT inhaler Inhale 2 puffs into the lungs every 6 (six) hours as needed for  wheezing or shortness of breath.  [name] alendronate (FOSAMAX) 70 MG tablet TAKE 1 TABLET ONE TIME A WEEK. TAKE WITH A FULL GLASS OF WATER ON AN EMPTY STOMACH.  [name] amLODipine (NORVASC) 2.5 MG tablet Take 1 tablet (2.5 mg total) by mouth daily.  [name] atorvastatin (LIPITOR) 10 MG tablet TAKE 1 TABLET EVERY DAY  . donepezil (ARICEPT) 10 MG tablet TAKE 1 TABLET AT BEDTIME  . hydrOXYzine (ATARAX/VISTARIL) 10 MG tablet  Take one tablet by mouth at bedtime for sleep  . loratadine (CLARITIN) 10 MG tablet Take 10 mg by mouth daily as needed for allergies.   . mirtazapine (REMERON) 15 MG tablet TAKE 1 TABLET (15 MG TOTAL) BY MOUTH AT BEDTIME.  . NON FORMULARY Diet-regular  . omeprazole (PRILOSEC) 40 MG capsule TAKE 1 CAPSULE EVERY DAY  . OXYGEN Inhale 2 L into the lungs continuous.  . STRIVERDI RESPIMAT 2.5 MCG/ACT AERS INHALE 2 PUFFS ONE TIME DAILY AT THE SAME TIME  . verapamil (CALAN-SR) 240 MG CR tablet TAKE 1 TABLET AT BEDTIME   No facility-administered encounter medications on file as of 03/26/2021.     SIGNIFICANT DIAGNOSTIC EXAMS   LABS REVIEWED TODAY  01-06-21: wbc 6.1; hgb 11.3; hct 37.1; mcv 88; plt 278; glucose 150; bun 24; creat 1.25; k+ 3.2; na++ 148; ca 8.9; GFR 47; liver normal albumin 3.7; chol 152; ldl 61; trig 125; hdl 69; vit D 63.3; tsh 0.407; free T4; 0.84  Review of Systems  Constitutional: Negative for malaise/fatigue.  Respiratory: Negative for cough and shortness of breath.   Cardiovascular: Negative for chest pain, palpitations and leg swelling.  Gastrointestinal: Negative for abdominal pain, constipation and heartburn.  Musculoskeletal: Negative for back pain, joint pain and myalgias.  Skin: Negative.   Neurological: Negative for dizziness.  Psychiatric/Behavioral: The patient is not nervous/anxious.     Physical Exam Constitutional:      General: She is not in acute distress.    Appearance: She is well-developed and overweight. She is not diaphoretic.  Neck:      Thyroid: No thyromegaly.  Cardiovascular:     Rate and Rhythm: Normal rate and regular rhythm.     Pulses: Normal pulses.     Heart sounds: Murmur heard.      Comments: 2/6 Pulmonary:     Effort: Pulmonary effort is normal. No respiratory distress.     Breath sounds: Wheezing present.     Comments: Few and scattered 02 dependent  Abdominal:     General: Bowel sounds are normal. There is no distension.     Palpations: Abdomen is soft.     Tenderness: There is no abdominal tenderness.  Musculoskeletal:        General: Normal range of motion.  Lymphadenopathy:     Cervical: No cervical adenopathy.  Skin:    General: Skin is warm and dry.  Neurological:     Mental Status: She is alert. Mental status is at baseline.  Psychiatric:        Mood and Affect: Mood normal.    ASSESSMENT AND PLAN  TODAY  1.  COPD with hypoxia: is stable is 02 dependent: will continue striverdi respimat 2.5 mcg 2 puffs daily; claritin 10 mg daily as needed albuterol 2 puffs every 6 hours as needed  2. Essential hypertension: is stable b/p 155/67 will continue norvasc 2.5 mg daily calan SR 240 mg daily   3. Esophageal reflux disease without esophagitis: is stable will continue prilosec 40 mg daily   4. Vascular dementia without behavioral disturbance: is without change; her weight is 236 pounds; will continue aricept 10 mg daily   5. Generalized osteoarthritis of multiple sites: has tylenol 650 mg every 8 hours as needed  6. 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 Stage 3a chronic kidney disease: is stable bun 24; creat 1.25 will monitor   8. normacytic anemia: is stagle hgb 11.3 will  monitor  9. 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 Low thyroid stimulating hormone (TSH); tsh is 0.407 free t4: 0.84 will check free t3  11. Psychophysiological insomnia: is  stable will continue atarax 10 mg nightly remeron 15 mg nightly   12. Hyperlipidemia LDL goal <100: ldl 61 will continue lipiotr 10 mg daily   Will check cbc; cmp; lipids free ts   Time spent with patient: 45 minutes: coordination therapy medications; goals of care     Ok Edwards NP [facility]  Contact [phone] Monday through Friday 8am- 5pm  After hours call [phone]

## 2021-03-27 ENCOUNTER — Encounter: Payer: Self-pay | Admitting: Internal Medicine

## 2021-03-27 ENCOUNTER — Non-Acute Institutional Stay (SKILLED_NURSING_FACILITY): Payer: Medicare PPO | Admitting: Internal Medicine

## 2021-03-27 DIAGNOSIS — I10 Essential (primary) hypertension: Secondary | ICD-10-CM | POA: Diagnosis not present

## 2021-03-27 DIAGNOSIS — N183 Chronic kidney disease, stage 3 unspecified: Secondary | ICD-10-CM | POA: Insufficient documentation

## 2021-03-27 DIAGNOSIS — E785 Hyperlipidemia, unspecified: Secondary | ICD-10-CM | POA: Diagnosis not present

## 2021-03-27 DIAGNOSIS — R7989 Other specified abnormal findings of blood chemistry: Secondary | ICD-10-CM

## 2021-03-27 DIAGNOSIS — J449 Chronic obstructive pulmonary disease, unspecified: Secondary | ICD-10-CM

## 2021-03-27 DIAGNOSIS — N1831 Chronic kidney disease, stage 3a: Secondary | ICD-10-CM | POA: Diagnosis not present

## 2021-03-27 DIAGNOSIS — R0902 Hypoxemia: Secondary | ICD-10-CM | POA: Diagnosis not present

## 2021-03-27 DIAGNOSIS — D649 Anemia, unspecified: Secondary | ICD-10-CM

## 2021-03-27 NOTE — Assessment & Plan Note (Addendum)
BP controlled; no change in antihypertensive medications; but monitor Na+ (148) & K+ (3.2)

## 2021-03-27 NOTE — Assessment & Plan Note (Signed)
Current LDL 61, @ goal No change indicated

## 2021-03-27 NOTE — Progress Notes (Signed)
NURSING HOME LOCATION:  [facility] ROOM NUMBER:  149 W  CODE STATUS:  DNR  PCP:  Tula Nakayama MD with transition to Bard Herbert NP  This is a comprehensive admission note to this SNFperformed on this date less than 30 days from date of admission. Included are preadmission medical/surgical history; reconciled medication list; family history; social history and comprehensive review of systems.  Corrections and additions to the records were documented. Comprehensive physical exam was also performed. Additionally a clinical summary was entered for each active diagnosis pertinent to this admission in the Problem List to enhance continuity of care.  HPI: Patient was admitted from her daughter's home in [place], [place] as care needs could be met better in the skilled facility due to multiple co-morbidities including sun downing. Also another daughter , Juleen China , is a Marine scientist @ this facility.  Past medical and surgical history: Includes abnormality of gait, history of allergic rhinitis, history of chronic anemia, anxiety/depression, oxygen dependent COPD, OSA on CPAP, DDD of cervical and lumbar spine, dementia, GERD, essential hypertension, and history of high-grade colonic adenomas. Surgeries and procedures include abdominal hysterectomy, colonic resection, EGD with esophageal dilation, and ORIF surgeries for bilateral ankle fractures.  Social history: She is widowed mother of 4 adults.  She is a nondrinker.  She has a 60-pack-year history of smoking.  She did quit in 2007.  Family history: There is an extensive family history of cancer.   Review of systems: Clinical neurocognitive deficits made validity of responses questionable & prevented ROS completion.  She could not give me the date, even the year.  When asked the year she kept repeating "1942", year of her birth .  She joked repeatedly about not knowing answers to queries " because I'm old. an old chicken".   She  initially began to tell me about "leeches, not like sores" indicating some skin issues. She was recently treated for dermatitis as well as a UTI. She isapparently still complaining about pruritis.  She subsequently began to talk about playing basketball as a youth.  Constitutional: No fever, significant weight change, fatigue  Cardiovascular: No chest pain, palpitations, paroxysmal nocturnal dyspnea, edema  Respiratory: No cough, sputum production, hemoptysis Gastrointestinal: No heartburn, dysphagia, abdominal pain, nausea /vomiting, rectal bleeding, melena, change in bowels Genitourinary: No dysuria, hematuria, pyuria, incontinence, nocturia Musculoskeletal: No joint stiffness, joint swelling, weakness, pain Neurologic: No dizziness, headache, syncope, seizures, numbness, tingling Psychiatric: No significant anxiety, depression, insomnia, anorexia Endocrine: No change in hair/skin/nails, excessive thirst, excessive hunger, excessive urination  Hematologic/lymphatic: No significant bruising, lymphadenopathy, abnormal bleeding Allergy/immunology: No itchy/watery eyes, significant sneezing, urticaria, angioedema   Physical exam:  Pertinent or positive findings: She is a delight to interview,poking fun @ herself while smiling &  laughing . She exhibits a slight head tremor.  She has complete dentures.  Breath sounds are markedly decreased.  Slight rhonchi are noted anteriorly.  Her heart rhythm tends to reveal a premature beat every third beat for the most part.  Dorsalis pedis pulses are stronger than the posterior tibial pulses.  She is incredibly strong to opposition.Small isolated keratotic & vitiliginous lesions present on shins.  General appearance: Adequately nourished; no acute distress, increased work of breathing is present.   Lymphatic: No lymphadenopathy about the head, neck, axilla. Eyes: No conjunctival inflammation or lid edema is present. There is no scleral icterus. Ears:   External ear exam shows no significant lesions or deformities.   Nose:  External nasal examination shows no  deformity or inflammation. Nasal mucosa are pink and moist without lesions, exudates Oral exam: Lips healthy appearing.There is no oropharyngeal erythema or exudate. Neck:  No thyromegaly, masses, tenderness noted.    Heart:  No gallop, murmur, click, rub.  Lungs: without wheezes, rubs. Abdomen: Bowel sounds are normal.  Abdomen is soft and nontender with no organomegaly, hernias, masses. GU: Deferred  Extremities:  No cyanosis, clubbing, edema. Neurologic exam:Balance, Rhomberg, finger to nose testing could not be completed due to clinical state Skin: Warm & dry w/o tenting.  See clinical summary under each active problem in the Problem List with associated updated therapeutic plan

## 2021-03-27 NOTE — Assessment & Plan Note (Signed)
Current H/H 11.3/37.1 with minimal hypochromic indices (MCHC 30.5) Monitor @ SNF

## 2021-03-27 NOTE — Patient Instructions (Signed)
See assessment and plan under each diagnosis in the problem list and acutely for this visit 

## 2021-03-27 NOTE — Assessment & Plan Note (Signed)
Creat 1.25/GFR 47 On no nephrotoxic meds

## 2021-03-27 NOTE — Assessment & Plan Note (Addendum)
O2 sats 95% on 2 L/min O2 on present pulmonary regimen

## 2021-03-27 NOTE — Assessment & Plan Note (Addendum)
TSH 0.407 on 01/06/2021 (range 0.336- 0.79) ; free T4 normal. TSH & free T3 will be checked to assess possible  subclinical hyperthyroidism

## 2021-03-30 ENCOUNTER — Encounter: Payer: Self-pay | Admitting: Adult Health

## 2021-03-30 ENCOUNTER — Non-Acute Institutional Stay (SKILLED_NURSING_FACILITY): Payer: Medicare PPO | Admitting: Adult Health

## 2021-03-30 ENCOUNTER — Other Ambulatory Visit (HOSPITAL_COMMUNITY)
Admission: RE | Admit: 2021-03-30 | Discharge: 2021-03-30 | Disposition: A | Discharge status: Home / Self Care | Payer: Medicare PPO | Source: Skilled Nursing Facility | Attending: Adult Health | Admitting: Adult Health

## 2021-03-30 DIAGNOSIS — E43 Unspecified severe protein-calorie malnutrition: Secondary | ICD-10-CM | POA: Insufficient documentation

## 2021-03-30 DIAGNOSIS — D649 Anemia, unspecified: Secondary | ICD-10-CM

## 2021-03-30 DIAGNOSIS — Z6831 Body mass index (BMI) 31.0-31.9, adult: Secondary | ICD-10-CM | POA: Diagnosis not present

## 2021-03-30 LAB — LIPID PANEL
Cholesterol: 141 mg/dL (ref 0–200)
HDL: 63 mg/dL (ref >40)
LDL Cholesterol: 65 mg/dL (ref 0–99)
Total CHOL/HDL Ratio: 2.2 RATIO
Triglycerides: 67 mg/dL (ref <150)
VLDL: 13 mg/dL (ref 0–40)

## 2021-03-30 LAB — COMPREHENSIVE METABOLIC PANEL
ALT: 16 U/L (ref 0–44)
AST: 18 U/L (ref 15–41)
Albumin: 2.9 g/dL — ABNORMAL LOW (ref 3.5–5.0)
Alkaline Phosphatase: 51 U/L (ref 38–126)
Anion gap: 5 (ref 5–15)
BUN: 23 mg/dL (ref 8–23)
CO2: 31 mmol/L (ref 22–32)
Calcium: 8.6 mg/dL — ABNORMAL LOW (ref 8.9–10.3)
Chloride: 107 mmol/L (ref 98–111)
Creatinine, Ser: 0.86 mg/dL (ref 0.44–1.00)
GFR, Estimated: >60 mL/min (ref >60)
Glucose, Bld: 78 mg/dL (ref 70–99)
Potassium: 3.5 mmol/L (ref 3.5–5.1)
Sodium: 143 mmol/L (ref 135–145)
Total Bilirubin: 0.4 mg/dL (ref 0.3–1.2)
Total Protein: 5.6 g/dL — ABNORMAL LOW (ref 6.5–8.1)

## 2021-03-30 LAB — CBC
HCT: 30.2 % — ABNORMAL LOW (ref 36.0–46.0)
Hemoglobin: 8.9 g/dL — ABNORMAL LOW (ref 12.0–15.0)
MCH: 27.4 pg (ref 26.0–34.0)
MCHC: 29.5 g/dL — ABNORMAL LOW (ref 30.0–36.0)
MCV: 92.9 fL (ref 80.0–100.0)
Platelets: 210 10*3/uL (ref 150–400)
RBC: 3.25 MIL/uL — ABNORMAL LOW (ref 3.87–5.11)
RDW: 14.6 % (ref 11.5–15.5)
WBC: 5.5 10*3/uL (ref 4.0–10.5)
nRBC: 0 % (ref 0.0–0.2)

## 2021-03-30 NOTE — Progress Notes (Signed)
Location:  [place] Room Number: 149-W Place of Service:  SNF (31)   CODE STATUS: DNR  Allergies  Allergen Reactions  . Fenofibrate Nausea And Vomiting  . Pravastatin Sodium Nausea And Vomiting  . Sulfonamide Derivatives Other (See Comments)    unknown  . Ciprofloxacin Rash    Chief Complaint  Patient presents with  . Acute Visit    Lab follow-up    HPI:  She is a 80 year old recent admission to this facility who has had lab work done. Her hgb is down to 8.9 from 11.3 ; her albumin is low at 2.9. she denies seeing any blood in her stool; no reports of black or tarry stools. Her appetite is good.   Past Medical History:  Diagnosis Date  . Abnormality of gait 06/06/2014  . Allergic rhinitis   . Anemia   . Ankle fracture, right   . Anxiety   . Arm fracture, left   . Arthritis    abnormal gait   . COPD (chronic obstructive pulmonary disease) (HCC)    chronic CO2 retention, decreased DLCO   . Cystic acne    adult   . 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 Degenerative disc disease, lumbar    L5 nerve impingement   . Dementia ([place])   . Depression   . DNR (do not resuscitate)   . GERD (gastroesophageal reflux disease)   . Heme positive stool 10/10/2017  . History of colon surgery    right hemicolectomy for high-grade adenomas in right colon 2013  . Hyperglycemia   . Hypertension   . Low back pain   . Mediastinal lymphadenopathy 1/9   resolving   . Memory difficulty 09/20/2014   mild dementia  . Onychomycosis   . OSA on CPAP   . Sleep apnea    stop bang score 6    Past Surgical History:  Procedure Laterality Date  . ABDOMINAL HYSTERECTOMY  1976   secondary to bleeding   . APPENDECTOMY    . COLON RESECTION  08/18/2012   Procedure: HAND ASSISTED LAPAROSCOPIC COLON RESECTION;  Surgeon: Jamesetta So, MD;  Location: [facility];  Service: General;  Laterality: N/A;  . COLONOSCOPY  07/20/2012   Dr. Gala Romney: multiple colonic  polyps with large polyps on right side, s/p saline-assisted debulking piecemeal polypectomy and ablation. Not all removed. Path with tubulovillous adenomas, high grade dysplasia.   . COLONOSCOPY N/A 03/09/2013   [id number] polyps-tubular adenomas. S/p right hemicolectomy. next tcs 02/2018  . epidural steroids    . ESOPHAGOGASTRODUODENOSCOPY (EGD) WITH ESOPHAGEAL DILATION N/A 02/06/2014   Procedure: ESOPHAGOGASTRODUODENOSCOPY (EGD) WITH ESOPHAGEAL DILATION;  Surgeon: Daneil Dolin, MD;  Location: [facility];  Service: Endoscopy;  Laterality: N/A;  9:30  . OOPHORECTOMY  1976  . ORIF ANKLE FRACTURE  01/18/2012   Procedure: OPEN REDUCTION INTERNAL FIXATION (ORIF) ANKLE FRACTURE;  Surgeon: Arther Abbott, MD;  Location: [facility];  Service: Orthopedics;  Laterality: Left;  . ORIF ANKLE FRACTURE Right 01/13/2016   Procedure: OPEN REDUCTION INTERNAL FIXATION (ORIF) ANKLE FRACTURE;  Surgeon: Carole Civil, MD;  Location: [facility];  Service: Orthopedics;  Laterality: Right;    Social History   Socioeconomic History  . Marital status: Widowed    Spouse name: Not on file  . Number of children: 4  . Years of education: college 1  . Highest education level: Not on file  Occupational History  . Occupation: employed in the  tobacco industry     Employer: RETIRED  Tobacco Use  . Smoking status: Former Smoker    Packs/day: 1.50    Years: 40.00    Pack years: 60.00    Types: Cigarettes    Quit date: 06/21/2006    Years since quitting: 14.7  . Smokeless tobacco: Never Used  Vaping Use  . Vaping Use: Never used  Substance and Sexual Activity  . Alcohol use: No  . Drug use: No  . Sexual activity: Never  Other Topics Concern  . Not on file  Social History Narrative   Lives at home alone   Right-handed   Drinks 2 or more cups of coffee each morning   Social Determinants of Health   Financial Resource Strain: Low Risk   . Difficulty of Paying Living Expenses: Not hard at all  Food Insecurity:  No Food Insecurity  . Worried About Charity fundraiser in the Last Year: Never true  . Ran Out of Food in the Last Year: Never true  Transportation Needs: No Transportation Needs  . Lack of Transportation (Medical): No  . Lack of Transportation (Non-Medical): No  Physical Activity: Inactive  . Days of Exercise per Week: 0 days  . Minutes of Exercise per Session: 0 min  Stress: No Stress Concern Present  . Feeling of Stress : Not at all  Social Connections: Socially Isolated  . Frequency of Communication with Friends and Family: Three times a week  . Frequency of Social Gatherings with Friends and Family: Once a week  . Attends Religious Services: Never  . Active Member of Clubs or Organizations: No  . Attends Archivist Meetings: Never  . Marital Status: Widowed  Intimate Partner Violence: Not At Risk  . Fear of Current or Ex-Partner: No  . Emotionally Abused: No  . Physically Abused: No  . Sexually Abused: No   Family History  Problem Relation Age of Onset  . Stomach cancer Father   . Cancer Father   . Cancer Mother        brain   . Breast cancer Mother   . Arthritis Other       VITAL SIGNS BP 140/80   Pulse 80   Temp (!) 97.2 F (36.2 C)   Resp 20   Ht 6\' 1"  (1.854 m)   Wt 236 lb 14.4 oz (107.5 kg)   SpO2 94%   BMI 31.26 kg/m   Outpatient Encounter Medications as of 03/30/2021  Medication Sig  . acetaminophen (TYLENOL) 325 MG tablet Take 325 mg by mouth every 8 (eight) hours as needed. Take 2 tabs (650mg  total), oral, Every 8 Hours - PRN, As needed for pain.  [name] albuterol (VENTOLIN HFA) 108 (90 Base) MCG/ACT inhaler Inhale 2 puffs into the lungs every 6 (six) hours as needed for wheezing or shortness of breath.  [name] alendronate (FOSAMAX) 70 MG tablet TAKE 1 TABLET ONE TIME A WEEK. TAKE WITH A FULL GLASS OF WATER ON AN EMPTY STOMACH.  [name] amLODipine (NORVASC) 2.5 MG tablet Take 1 tablet (2.5 mg total) by mouth daily.  [name] atorvastatin (LIPITOR) 10 MG tablet  TAKE 1 TABLET EVERY DAY  . CALCIUM CARBONATE-VITAMIN D PO Take by mouth. 600 mg-10 mcg, oral, Once A Day  . Cholecalciferol (VITAMIN D3 PO) Take 25 mcg by mouth daily. Take 25 mcg (1,000 Units), oral, Once A Day  . donepezil (ARICEPT) 10 MG tablet TAKE 1 TABLET AT BEDTIME  . hydrOXYzine (ATARAX/VISTARIL) 10 MG tablet  Take one tablet by mouth at bedtime for sleep  . loperamide (IMODIUM A-D) 2 MG tablet Take 2 mg by mouth 4 (four) times daily as needed for diarrhea or loose stools.  [name] loratadine (CLARITIN) 10 MG tablet Take 10 mg by mouth daily as needed for allergies.   . mirtazapine (REMERON) 15 MG tablet TAKE 1 TABLET (15 MG TOTAL) BY MOUTH AT BEDTIME.  . NON FORMULARY Diet-regular  . omeprazole (PRILOSEC) 40 MG capsule TAKE 1 CAPSULE EVERY DAY  . OXYGEN Inhale 2 L into the lungs continuous.  . STRIVERDI RESPIMAT 2.5 MCG/ACT AERS INHALE 2 PUFFS ONE TIME DAILY AT THE SAME TIME  . verapamil (CALAN-SR) 240 MG CR tablet TAKE 1 TABLET AT BEDTIME  . [DISCONTINUED] acetaminophen (TYLENOL) 650 MG CR tablet Take 650 mg by mouth every 8 (eight) hours as needed for pain. (Patient not taking: Reported on 03/30/2021)   No facility-administered encounter medications on file as of 03/30/2021.     SIGNIFICANT DIAGNOSTIC EXAMS   LABS REVIEWED PREVIOUS  01-06-21: wbc 6.1; hgb 11.3; hct 37.1; mcv 88; plt 278; glucose 150; bun 24; creat 1.25; k+ 3.2; na++ 148; ca 8.9; GFR 47; liver normal albumin 3.7; chol 152; ldl 61; trig 125; hdl 69; vit D 63.3; tsh 0.407; free T4; 0.84  TODAY  03-30-21: wbc 5.5; hgb 8.9; hct 30.2; mcv 92.9 plt 210; glucose 78; bun 23; creat 0.86; k+ 3.5; na++ 143; ca 8.6 GFR>60; liver normal albumin 2.9; chol 141; ldl 65; trig 67; hdl 63  Review of Systems  Constitutional: Negative for malaise/fatigue.  Respiratory: Negative for cough and shortness of breath.   Cardiovascular: Negative for chest pain, palpitations and leg swelling.  Gastrointestinal: Negative for abdominal pain,  constipation and heartburn.  Musculoskeletal: Negative for back pain, joint pain and myalgias.  Skin: Negative.   Neurological: Negative for dizziness.  Psychiatric/Behavioral: The patient is not nervous/anxious.     Physical Exam Constitutional:      General: She is not in acute distress.    Appearance: She is well-developed. She is not diaphoretic.  Neck:     Thyroid: No thyromegaly.  Cardiovascular:     Rate and Rhythm: Normal rate and regular rhythm.     Pulses: Normal pulses.     Heart sounds: Murmur heard.      Comments: 2/6 Pulmonary:     Effort: Pulmonary effort is normal. No respiratory distress.     Breath sounds: Normal breath sounds.     Comments: 02 dependent  Abdominal:     General: Bowel sounds are normal. There is no distension.     Palpations: Abdomen is soft.     Tenderness: There is no abdominal tenderness.  Musculoskeletal:        General: Normal range of motion.     Cervical back: Neck supple.     Right lower leg: No edema.     Left lower leg: No edema.  Lymphadenopathy:     Cervical: No cervical adenopathy.  Skin:    General: Skin is warm and dry.  Neurological:     Mental Status: She is alert. Mental status is at baseline.  Psychiatric:        Mood and Affect: Mood normal.      ASSESSMENT/ PLAN:  TODAY  1. Normocytic anemia 2. Protein calorie malnutrition severe  Has history of right hemicolectomy  Will guaiac stool X 3 Will begin her on prostat 30 mL three times daily  Will monitor her status.  Ok Edwards NP [facility]  Contact [phone] Monday through Friday 8am- 5pm  After hours call [phone]

## 2021-03-31 DIAGNOSIS — E43 Unspecified severe protein-calorie malnutrition: Secondary | ICD-10-CM | POA: Insufficient documentation

## 2021-03-31 LAB — T3, FREE: T3, Free: 2.9 pg/mL (ref 2.0–4.4)

## 2021-04-02 DIAGNOSIS — Z9181 History of falling: Secondary | ICD-10-CM | POA: Diagnosis not present

## 2021-04-02 DIAGNOSIS — R279 Unspecified lack of coordination: Secondary | ICD-10-CM | POA: Diagnosis not present

## 2021-04-02 DIAGNOSIS — F0390 Unspecified dementia without behavioral disturbance: Secondary | ICD-10-CM | POA: Diagnosis not present

## 2021-04-02 DIAGNOSIS — M159 Polyosteoarthritis, unspecified: Secondary | ICD-10-CM | POA: Diagnosis not present

## 2021-04-02 DIAGNOSIS — J9611 Chronic respiratory failure with hypoxia: Secondary | ICD-10-CM | POA: Diagnosis not present

## 2021-04-02 DIAGNOSIS — R2681 Unsteadiness on feet: Secondary | ICD-10-CM | POA: Diagnosis not present

## 2021-04-02 DIAGNOSIS — M6281 Muscle weakness (generalized): Secondary | ICD-10-CM | POA: Diagnosis not present

## 2021-04-03 ENCOUNTER — Encounter: Payer: Self-pay | Admitting: Adult Health

## 2021-04-03 ENCOUNTER — Non-Acute Institutional Stay (SKILLED_NURSING_FACILITY): Payer: Medicare PPO | Admitting: Adult Health

## 2021-04-03 DIAGNOSIS — R2681 Unsteadiness on feet: Secondary | ICD-10-CM | POA: Diagnosis not present

## 2021-04-03 DIAGNOSIS — J9611 Chronic respiratory failure with hypoxia: Secondary | ICD-10-CM | POA: Diagnosis not present

## 2021-04-03 DIAGNOSIS — Z9181 History of falling: Secondary | ICD-10-CM | POA: Diagnosis not present

## 2021-04-03 DIAGNOSIS — M6281 Muscle weakness (generalized): Secondary | ICD-10-CM | POA: Diagnosis not present

## 2021-04-03 DIAGNOSIS — N3281 Overactive bladder: Secondary | ICD-10-CM

## 2021-04-03 DIAGNOSIS — R279 Unspecified lack of coordination: Secondary | ICD-10-CM | POA: Diagnosis not present

## 2021-04-03 DIAGNOSIS — M159 Polyosteoarthritis, unspecified: Secondary | ICD-10-CM | POA: Diagnosis not present

## 2021-04-03 DIAGNOSIS — F0390 Unspecified dementia without behavioral disturbance: Secondary | ICD-10-CM | POA: Diagnosis not present

## 2021-04-03 NOTE — Progress Notes (Signed)
Location:  [place] Room Number: 149-W Place of Service:  SNF (31)   CODE STATUS: DNR  Allergies  Allergen Reactions  . Fenofibrate Nausea And Vomiting  . Pravastatin Sodium Nausea And Vomiting  . Sulfonamide Derivatives Other (See Comments)    unknown  . Ciprofloxacin Rash    Chief Complaint  Patient presents with  . Acute Visit    Over active bladder    HPI:  Staff reports that she is getting up all night to urinate. There are less episodes during the day time. She denies any pain with urination; no foul urine. There are no changes in appetite; no back or pelvic pain.   Past Medical History:  Diagnosis Date  . Abnormality of gait 06/06/2014  . Allergic rhinitis   . Anemia   . Ankle fracture, right   . Anxiety   . Arm fracture, left   . Arthritis    abnormal gait   . COPD (chronic obstructive pulmonary disease) (HCC)    chronic CO2 retention, decreased DLCO   . Cystic acne    adult   . 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 Degenerative disc disease, lumbar    L5 nerve impingement   . Dementia ([place])   . Depression   . DNR (do not resuscitate)   . Fracture of right proximal fibula 07/13/18 09/05/2018  . GERD (gastroesophageal reflux disease)   . Heme positive stool 10/10/2017  . History of colon surgery    right hemicolectomy for high-grade adenomas in right colon 2013  . Hyperglycemia   . Hypertension   . Low back pain   . Mediastinal lymphadenopathy 1/9   resolving   . Memory difficulty 09/20/2014   mild dementia  . Onychomycosis   . OSA on CPAP   . Sleep apnea    stop bang score 6    Past Surgical History:  Procedure Laterality Date  . ABDOMINAL HYSTERECTOMY  1976   secondary to bleeding   . APPENDECTOMY    . COLON RESECTION  08/18/2012   Procedure: HAND ASSISTED LAPAROSCOPIC COLON RESECTION;  Surgeon: Jamesetta So, MD;  Location: [facility];  Service: General;  Laterality: N/A;  . COLONOSCOPY  07/20/2012    Dr. Gala Romney: multiple colonic polyps with large polyps on right side, s/p saline-assisted debulking piecemeal polypectomy and ablation. Not all removed. Path with tubulovillous adenomas, high grade dysplasia.   . COLONOSCOPY N/A 03/09/2013   [id number] polyps-tubular adenomas. S/p right hemicolectomy. next tcs 02/2018  . epidural steroids    . ESOPHAGOGASTRODUODENOSCOPY (EGD) WITH ESOPHAGEAL DILATION N/A 02/06/2014   Procedure: ESOPHAGOGASTRODUODENOSCOPY (EGD) WITH ESOPHAGEAL DILATION;  Surgeon: Daneil Dolin, MD;  [facility];  Service: Endoscopy;  Laterality: N/A;  9:30  . OOPHORECTOMY  1976  . ORIF ANKLE FRACTURE  01/18/2012   Procedure: OPEN REDUCTION INTERNAL FIXATION (ORIF) ANKLE FRACTURE;  Surgeon: Arther Abbott, MD;  Location: [facility];  Service: Orthopedics;  Laterality: Left;  . ORIF ANKLE FRACTURE Right 01/13/2016   Procedure: OPEN REDUCTION INTERNAL FIXATION (ORIF) ANKLE FRACTURE;  Surgeon: Carole Civil, MD;  Location: [facility];  Service: Orthopedics;  Laterality: Right;    Social History   Socioeconomic History  . Marital status: Widowed    Spouse name: Not on file  . Number of children: 4  . Years of education: college 1  . Highest education level: Not on file  Occupational History  . Occupation: employed in the tobacco industry  Employer: RETIRED  Tobacco Use  . Smoking status: Former Smoker    Packs/day: 1.50    Years: 40.00    Pack years: 60.00    Types: Cigarettes    Quit date: 06/21/2006    Years since quitting: 14.7  . Smokeless tobacco: Never Used  Vaping Use  . Vaping Use: Never used  Substance and Sexual Activity  . Alcohol use: No  . Drug use: No  . Sexual activity: Never  Other Topics Concern  . Not on file  Social History Narrative   Lives at home alone   Right-handed   Drinks 2 or more cups of coffee each morning   Social Determinants of Health   Financial Resource Strain: Low Risk   . Difficulty of Paying Living Expenses: Not  hard at all  Food Insecurity: No Food Insecurity  . Worried About Charity fundraiser in the Last Year: Never true  . Ran Out of Food in the Last Year: Never true  Transportation Needs: No Transportation Needs  . Lack of Transportation (Medical): No  . Lack of Transportation (Non-Medical): No  Physical Activity: Inactive  . Days of Exercise per Week: 0 days  . Minutes of Exercise per Session: 0 min  Stress: No Stress Concern Present  . Feeling of Stress : Not at all  Social Connections: Socially Isolated  . Frequency of Communication with Friends and Family: Three times a week  . Frequency of Social Gatherings with Friends and Family: Once a week  . Attends Religious Services: Never  . Active Member of Clubs or Organizations: No  . Attends Archivist Meetings: Never  . Marital Status: Widowed  Intimate Partner Violence: Not At Risk  . Fear of Current or Ex-Partner: No  . Emotionally Abused: No  . Physically Abused: No  . Sexually Abused: No   Family History  Problem Relation Age of Onset  . Stomach cancer Father   . Cancer Father   . Cancer Mother        brain   . Breast cancer Mother   . Arthritis Other       VITAL SIGNS BP 110/60   Pulse 70   Temp 98 F (36.7 C)   Resp 20   Ht 6\' 1"  (1.854 m)   Wt 236 lb 14.4 oz (107.5 kg)   SpO2 94%   BMI 31.26 kg/m   Outpatient Encounter Medications as of 04/03/2021  Medication Sig  . acetaminophen (TYLENOL) 325 MG tablet Take 325 mg by mouth every 8 (eight) hours as needed. Take 2 tabs (650mg  total), oral, Every 8 Hours - PRN, As needed for pain.  [name] albuterol (VENTOLIN HFA) 108 (90 Base) MCG/ACT inhaler Inhale 2 puffs into the lungs every 6 (six) hours as needed for wheezing or shortness of breath.  [name] alendronate (FOSAMAX) 70 MG tablet TAKE 1 TABLET ONE TIME A WEEK. TAKE WITH A FULL GLASS OF WATER ON AN EMPTY STOMACH.  [name] Amino Acids-Protein Hydrolys (FEEDING SUPPLEMENT, PRO-STAT SUGAR FREE 64,) LIQD Take 30 mLs by  mouth 3 (three) times daily with meals.  [name] amLODipine (NORVASC) 2.5 MG tablet Take 1 tablet (2.5 mg total) by mouth daily.  [name] atorvastatin (LIPITOR) 10 MG tablet TAKE 1 TABLET EVERY DAY  . CALCIUM CARBONATE-VITAMIN D PO Take by mouth. 600 mg-10 mcg, oral, Once A Day  . Cholecalciferol (VITAMIN D3 PO) Take 25 mcg by mouth daily. Take 25 mcg (1,000 Units), oral, Once A Day  . donepezil (  ARICEPT) 10 MG tablet TAKE 1 TABLET AT BEDTIME  . hydrOXYzine (ATARAX/VISTARIL) 10 MG tablet Take one tablet by mouth at bedtime for sleep  . loperamide (IMODIUM A-D) 2 MG tablet Take 2 mg by mouth 4 (four) times daily as needed for diarrhea or loose stools.  [name] loratadine (CLARITIN) 10 MG tablet Take 10 mg by mouth daily as needed for allergies.   . mirtazapine (REMERON) 15 MG tablet TAKE 1 TABLET (15 MG TOTAL) BY MOUTH AT BEDTIME.  . NON FORMULARY Diet-regular  . omeprazole (PRILOSEC) 40 MG capsule TAKE 1 CAPSULE EVERY DAY  . OXYGEN Inhale 2 L into the lungs continuous.  . STRIVERDI RESPIMAT 2.5 MCG/ACT AERS INHALE 2 PUFFS ONE TIME DAILY AT THE SAME TIME  . verapamil (CALAN-SR) 240 MG CR tablet TAKE 1 TABLET AT BEDTIME   No facility-administered encounter medications on file as of 04/03/2021.     SIGNIFICANT DIAGNOSTIC EXAMS   LABS REVIEWED PREVIOUS  01-06-21: wbc 6.1; hgb 11.3; hct 37.1; mcv 88; plt 278; glucose 150; bun 24; creat 1.25; k+ 3.2; na++ 148; ca 8.9; GFR 47; liver normal albumin 3.7; chol 152; ldl 61; trig 125; hdl 69; vit D 63.3; tsh 0.407; free T4; 0.84 03-30-21: wbc 5.5; hgb 8.9; hct 30.2; mcv 92.9 plt 210; glucose 78; bun 23; creat 0.86; k+ 3.5; na++ 143; ca 8.6 GFR>60; liver normal albumin 2.9; chol 141; ldl 65; trig 67; hdl 63  NO NEW LABS.   Review of Systems  Constitutional: Negative for malaise/fatigue.  Respiratory: Negative for cough and shortness of breath.   Cardiovascular: Negative for chest pain, palpitations and leg swelling.  Gastrointestinal: Negative for abdominal pain,  constipation and heartburn.  Genitourinary: Positive for frequency and urgency.  Musculoskeletal: Negative for back pain, joint pain and myalgias.  Skin: Negative.   Neurological: Negative for dizziness.  Psychiatric/Behavioral: The patient is not nervous/anxious.     Physical Exam Constitutional:      General: She is not in acute distress.    Appearance: She is well-developed. She is not diaphoretic.  Neck:     Thyroid: No thyromegaly.  Cardiovascular:     Rate and Rhythm: Normal rate and regular rhythm.     Pulses: Normal pulses.     Heart sounds: Murmur heard.      Comments: 2/6 Pulmonary:     Effort: Pulmonary effort is normal. No respiratory distress.     Breath sounds: Normal breath sounds.     Comments: 02 dependent  Abdominal:     General: Bowel sounds are normal. There is no distension.     Palpations: Abdomen is soft.     Tenderness: There is no abdominal tenderness.  Musculoskeletal:        General: Normal range of motion.     Cervical back: Neck supple.     Right lower leg: No edema.     Left lower leg: No edema.  Lymphadenopathy:     Cervical: No cervical adenopathy.  Skin:    General: Skin is warm and dry.  Neurological:     Mental Status: She is alert. Mental status is at baseline.  Psychiatric:        Mood and Affect: Mood normal.       ASSESSMENT/ PLAN:  TODAY  1. OAB (overactive bladder): is worse: will begin myrbetriq 25 mg daily    Ok Edwards NP [facility]  Contact [phone] Monday through Friday 8am- 5pm  After hours call [phone]

## 2021-04-04 DIAGNOSIS — M6281 Muscle weakness (generalized): Secondary | ICD-10-CM | POA: Diagnosis not present

## 2021-04-04 DIAGNOSIS — M159 Polyosteoarthritis, unspecified: Secondary | ICD-10-CM | POA: Diagnosis not present

## 2021-04-04 DIAGNOSIS — R279 Unspecified lack of coordination: Secondary | ICD-10-CM | POA: Diagnosis not present

## 2021-04-04 DIAGNOSIS — R2681 Unsteadiness on feet: Secondary | ICD-10-CM | POA: Diagnosis not present

## 2021-04-04 DIAGNOSIS — J9611 Chronic respiratory failure with hypoxia: Secondary | ICD-10-CM | POA: Diagnosis not present

## 2021-04-04 DIAGNOSIS — Z9181 History of falling: Secondary | ICD-10-CM | POA: Diagnosis not present

## 2021-04-04 DIAGNOSIS — F0390 Unspecified dementia without behavioral disturbance: Secondary | ICD-10-CM | POA: Diagnosis not present

## 2021-04-06 DIAGNOSIS — J9611 Chronic respiratory failure with hypoxia: Secondary | ICD-10-CM | POA: Diagnosis not present

## 2021-04-06 DIAGNOSIS — R2681 Unsteadiness on feet: Secondary | ICD-10-CM | POA: Diagnosis not present

## 2021-04-06 DIAGNOSIS — R279 Unspecified lack of coordination: Secondary | ICD-10-CM | POA: Diagnosis not present

## 2021-04-06 DIAGNOSIS — M159 Polyosteoarthritis, unspecified: Secondary | ICD-10-CM | POA: Diagnosis not present

## 2021-04-06 DIAGNOSIS — M6281 Muscle weakness (generalized): Secondary | ICD-10-CM | POA: Diagnosis not present

## 2021-04-06 DIAGNOSIS — F0390 Unspecified dementia without behavioral disturbance: Secondary | ICD-10-CM | POA: Diagnosis not present

## 2021-04-06 DIAGNOSIS — Z9181 History of falling: Secondary | ICD-10-CM | POA: Diagnosis not present

## 2021-04-07 ENCOUNTER — Ambulatory Visit: Payer: Medicare PPO | Admitting: Family Medicine

## 2021-04-07 DIAGNOSIS — N3281 Overactive bladder: Secondary | ICD-10-CM | POA: Insufficient documentation

## 2021-04-07 DIAGNOSIS — M6281 Muscle weakness (generalized): Secondary | ICD-10-CM | POA: Diagnosis not present

## 2021-04-07 DIAGNOSIS — M159 Polyosteoarthritis, unspecified: Secondary | ICD-10-CM | POA: Diagnosis not present

## 2021-04-07 DIAGNOSIS — J9611 Chronic respiratory failure with hypoxia: Secondary | ICD-10-CM | POA: Diagnosis not present

## 2021-04-07 DIAGNOSIS — R2681 Unsteadiness on feet: Secondary | ICD-10-CM | POA: Diagnosis not present

## 2021-04-07 DIAGNOSIS — Z9181 History of falling: Secondary | ICD-10-CM | POA: Diagnosis not present

## 2021-04-07 DIAGNOSIS — R279 Unspecified lack of coordination: Secondary | ICD-10-CM | POA: Diagnosis not present

## 2021-04-07 DIAGNOSIS — F0390 Unspecified dementia without behavioral disturbance: Secondary | ICD-10-CM | POA: Diagnosis not present

## 2021-04-08 DIAGNOSIS — R2681 Unsteadiness on feet: Secondary | ICD-10-CM | POA: Diagnosis not present

## 2021-04-08 DIAGNOSIS — M159 Polyosteoarthritis, unspecified: Secondary | ICD-10-CM | POA: Diagnosis not present

## 2021-04-08 DIAGNOSIS — R279 Unspecified lack of coordination: Secondary | ICD-10-CM | POA: Diagnosis not present

## 2021-04-08 DIAGNOSIS — M6281 Muscle weakness (generalized): Secondary | ICD-10-CM | POA: Diagnosis not present

## 2021-04-08 DIAGNOSIS — F0390 Unspecified dementia without behavioral disturbance: Secondary | ICD-10-CM | POA: Diagnosis not present

## 2021-04-08 DIAGNOSIS — J9611 Chronic respiratory failure with hypoxia: Secondary | ICD-10-CM | POA: Diagnosis not present

## 2021-04-08 DIAGNOSIS — Z9181 History of falling: Secondary | ICD-10-CM | POA: Diagnosis not present

## 2021-04-09 ENCOUNTER — Non-Acute Institutional Stay (SKILLED_NURSING_FACILITY): Payer: Medicare PPO | Admitting: Adult Health

## 2021-04-09 ENCOUNTER — Encounter: Payer: Self-pay | Admitting: Adult Health

## 2021-04-09 DIAGNOSIS — R0902 Hypoxemia: Secondary | ICD-10-CM | POA: Diagnosis not present

## 2021-04-09 DIAGNOSIS — E43 Unspecified severe protein-calorie malnutrition: Secondary | ICD-10-CM | POA: Diagnosis not present

## 2021-04-09 DIAGNOSIS — F0150 Vascular dementia without behavioral disturbance: Secondary | ICD-10-CM | POA: Diagnosis not present

## 2021-04-09 DIAGNOSIS — M159 Polyosteoarthritis, unspecified: Secondary | ICD-10-CM | POA: Diagnosis not present

## 2021-04-09 DIAGNOSIS — F0390 Unspecified dementia without behavioral disturbance: Secondary | ICD-10-CM | POA: Diagnosis not present

## 2021-04-09 DIAGNOSIS — M6281 Muscle weakness (generalized): Secondary | ICD-10-CM | POA: Diagnosis not present

## 2021-04-09 DIAGNOSIS — J449 Chronic obstructive pulmonary disease, unspecified: Secondary | ICD-10-CM | POA: Diagnosis not present

## 2021-04-09 DIAGNOSIS — F324 Major depressive disorder, single episode, in partial remission: Secondary | ICD-10-CM | POA: Diagnosis not present

## 2021-04-09 DIAGNOSIS — J9611 Chronic respiratory failure with hypoxia: Secondary | ICD-10-CM | POA: Diagnosis not present

## 2021-04-09 DIAGNOSIS — R279 Unspecified lack of coordination: Secondary | ICD-10-CM | POA: Diagnosis not present

## 2021-04-09 DIAGNOSIS — R2681 Unsteadiness on feet: Secondary | ICD-10-CM | POA: Diagnosis not present

## 2021-04-09 DIAGNOSIS — Z9181 History of falling: Secondary | ICD-10-CM | POA: Diagnosis not present

## 2021-04-09 NOTE — Progress Notes (Signed)
Location:  [place] Room Number: 149-W Place of Service:  SNF (31)   CODE STATUS: DNR  Allergies  Allergen Reactions  . Fenofibrate Nausea And Vomiting  . Pravastatin Sodium Nausea And Vomiting  . Sulfonamide Derivatives Other (See Comments)    unknown  . Ciprofloxacin Rash    Chief Complaint  Patient presents with  . Acute Visit    Care plan meeting.    HPI:  We have come together for her care plan meeting. No BIMS; mood 2/30: anxious at times. She requires limited assist with her adls. She is able to feed herself. She is occasionally incontinent of bladder and frequently incontinent of bowel. She is nonambulatory. She requires 02 at 2L. Has had 5 falls without significant injury. Therapy min assist for transfers; poor balance;  Upper body set up mod assist for lower body.    Dietary regular diet good appetite; doing well; weight 177 pounds protein tid.she does attend activities . 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 Abnormality of gait 06/06/2014  . Allergic rhinitis   . Anemia   . Ankle fracture, right   . Anxiety   . Arm fracture, left   . Arthritis    abnormal gait   . COPD (chronic obstructive pulmonary disease) (HCC)    chronic CO2 retention, decreased DLCO   . Cystic acne    adult   . 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 Degenerative disc disease, lumbar    L5 nerve impingement   . Dementia ([place])   . Depression   . DNR (do not resuscitate)   . Fracture of right proximal fibula 07/13/18 09/05/2018  . GERD (gastroesophageal reflux disease)   . Heme positive stool 10/10/2017  . History of colon surgery    right hemicolectomy for high-grade adenomas in right colon 2013  .  Hyperglycemia   . Hypertension   . Low back pain   . Mediastinal lymphadenopathy 1/9   resolving   . Memory difficulty 09/20/2014   mild dementia  . Onychomycosis   . OSA on CPAP   . Sleep apnea    stop bang score 6    Past Surgical History:  Procedure Laterality Date  . ABDOMINAL HYSTERECTOMY  1976   secondary to bleeding   . APPENDECTOMY    . COLON RESECTION  08/18/2012   Procedure: HAND ASSISTED LAPAROSCOPIC COLON RESECTION;  Surgeon: Jamesetta So, MD;  Location: [facility];  Service: General;  Laterality: N/A;  . COLONOSCOPY  07/20/2012   Dr. Gala Romney: multiple colonic polyps with large polyps on right side, s/p saline-assisted debulking piecemeal polypectomy and ablation. Not all removed. Path with tubulovillous adenomas, high grade dysplasia.   . COLONOSCOPY N/A 03/09/2013   NTZ:[id number] polyps-tubular adenomas. S/p right hemicolectomy. next tcs 02/2018  . epidural steroids    . ESOPHAGOGASTRODUODENOSCOPY (EGD) WITH ESOPHAGEAL DILATION N/A 02/06/2014   Procedure: ESOPHAGOGASTRODUODENOSCOPY (EGD) WITH ESOPHAGEAL DILATION;  Surgeon: Daneil Dolin, MD;  Location: [facility];  Service: Endoscopy;  Laterality: N/A;  9:30  . OOPHORECTOMY  1976  . ORIF ANKLE FRACTURE  01/18/2012   Procedure: OPEN REDUCTION INTERNAL FIXATION (ORIF) ANKLE FRACTURE;  Surgeon: Arther Abbott, MD;  Location: [facility];  Service: Orthopedics;  Laterality:  Left;  . ORIF ANKLE FRACTURE Right 01/13/2016   Procedure: OPEN REDUCTION INTERNAL FIXATION (ORIF) ANKLE FRACTURE;  Surgeon: Carole Civil, MD;  Location: [facility];  Service: Orthopedics;  Laterality: Right;    Social History   Socioeconomic History  . Marital status: Widowed    Spouse name: Not on file  . Number of children: 4  . Years of education: college 1  . Highest education level: Not on file  Occupational History  . Occupation: employed in the tobacco industry     Employer: RETIRED  Tobacco Use  . Smoking status: Former Smoker    Packs/day:  1.50    Years: 40.00    Pack years: 60.00    Types: Cigarettes    Quit date: 06/21/2006    Years since quitting: 14.8  . Smokeless tobacco: Never Used  Vaping Use  . Vaping Use: Never used  Substance and Sexual Activity  . Alcohol use: No  . Drug use: No  . Sexual activity: Never  Other Topics Concern  . Not on file  Social History Narrative   Lives at home alone   Right-handed   Drinks 2 or more cups of coffee each morning   Social Determinants of Health   Financial Resource Strain: Low Risk   . Difficulty of Paying Living Expenses: Not hard at all  Food Insecurity: No Food Insecurity  . Worried About Charity fundraiser in the Last Year: Never true  . Ran Out of Food in the Last Year: Never true  Transportation Needs: No Transportation Needs  . Lack of Transportation (Medical): No  . Lack of Transportation (Non-Medical): No  Physical Activity: Inactive  . Days of Exercise per Week: 0 days  . Minutes of Exercise per Session: 0 min  Stress: No Stress Concern Present  . Feeling of Stress : Not at all  Social Connections: Socially Isolated  . Frequency of Communication with Friends and Family: Three times a week  . Frequency of Social Gatherings with Friends and Family: Once a week  . Attends Religious Services: Never  . Active Member of Clubs or Organizations: No  . Attends Archivist Meetings: Never  . Marital Status: Widowed  Intimate Partner Violence: Not At Risk  . Fear of Current or Ex-Partner: No  . Emotionally Abused: No  . Physically Abused: No  . Sexually Abused: No   Family History  Problem Relation Age of Onset  . Stomach cancer Father   . Cancer Father   . Cancer Mother        brain   . Breast cancer Mother   . Arthritis Other       VITAL SIGNS BP 110/60   Pulse 70   Temp 98 F (36.7 C)   Resp 20   Ht 6\' 1"  (1.854 m)   Wt 236 lb 14.4 oz (107.5 kg)   SpO2 94%   BMI 31.26 kg/m   Outpatient Encounter Medications as of 04/09/2021   Medication Sig  . acetaminophen (TYLENOL) 325 MG tablet Take 325 mg by mouth every 8 (eight) hours as needed. Take 2 tabs (650mg  total), oral, Every 8 Hours - PRN, As needed for pain.  [name] albuterol (VENTOLIN HFA) 108 (90 Base) MCG/ACT inhaler Inhale 2 puffs into the lungs every 6 (six) hours as needed for wheezing or shortness of breath.  [name] alendronate (FOSAMAX) 70 MG tablet TAKE 1 TABLET ONE TIME A WEEK. TAKE WITH A FULL GLASS OF WATER ON AN EMPTY  STOMACH.  [name] Amino Acids-Protein Hydrolys (FEEDING SUPPLEMENT, PRO-STAT SUGAR FREE 64,) LIQD Take 30 mLs by mouth 3 (three) times daily with meals.  [name] amLODipine (NORVASC) 2.5 MG tablet Take 1 tablet (2.5 mg total) by mouth daily.  [name] atorvastatin (LIPITOR) 10 MG tablet TAKE 1 TABLET EVERY DAY  . CALCIUM CARBONATE-VITAMIN D PO Take by mouth. 600 mg-10 mcg, oral, Once A Day  . Cholecalciferol (VITAMIN D3 PO) Take 25 mcg by mouth daily. Take 25 mcg (1,000 Units), oral, Once A Day  . donepezil (ARICEPT) 10 MG tablet TAKE 1 TABLET AT BEDTIME  . hydrOXYzine (ATARAX/VISTARIL) 10 MG tablet Take one tablet by mouth at bedtime for sleep  . loperamide (IMODIUM A-D) 2 MG tablet Take 2 mg by mouth 4 (four) times daily as needed for diarrhea or loose stools.  [name] loratadine (CLARITIN) 10 MG tablet Take 10 mg by mouth daily as needed for allergies.   . mirabegron ER (MYRBETRIQ) 25 MG TB24 tablet Take 25 mg by mouth daily.  . mirtazapine (REMERON) 15 MG tablet TAKE 1 TABLET (15 MG TOTAL) BY MOUTH AT BEDTIME.  . NON FORMULARY Diet-regular  . omeprazole (PRILOSEC) 40 MG capsule TAKE 1 CAPSULE EVERY DAY  . OXYGEN Inhale 2 L into the lungs continuous.  . STRIVERDI RESPIMAT 2.5 MCG/ACT AERS INHALE 2 PUFFS ONE TIME DAILY AT THE SAME TIME  . verapamil (CALAN-SR) 240 MG CR tablet TAKE 1 TABLET AT BEDTIME   No facility-administered encounter medications on file as of 04/09/2021.     SIGNIFICANT DIAGNOSTIC EXAMS   LABS REVIEWED PREVIOUS  01-06-21: wbc 6.1; hgb 11.3; hct  37.1; mcv 88; plt 278; glucose 150; bun 24; creat 1.25; k+ 3.2; na++ 148; ca 8.9; GFR 47; liver normal albumin 3.7; chol 152; ldl 61; trig 125; hdl 69; vit D 63.3; tsh 0.407; free T4; 0.84 03-30-21: wbc 5.5; hgb 8.9; hct 30.2; mcv 92.9 plt 210; glucose 78; bun 23; creat 0.86; k+ 3.5; na++ 143; ca 8.6 GFR>60; liver normal albumin 2.9; chol 141; ldl 65; trig 67; hdl 63  NO NEW LABS.   Review of Systems  Constitutional: Negative for malaise/fatigue.  Respiratory: Negative for cough and shortness of breath.   Cardiovascular: Negative for chest pain, palpitations and leg swelling.  Gastrointestinal: Negative for abdominal pain, constipation and heartburn.  Musculoskeletal: Negative for back pain, joint pain and myalgias.  Skin: Negative.   Neurological: Negative for dizziness.  Psychiatric/Behavioral: The patient is not nervous/anxious.     Physical Exam Constitutional:      General: She is not in acute distress.    Appearance: She is well-developed. She is not diaphoretic.  Neck:     Thyroid: No thyromegaly.  Cardiovascular:     Rate and Rhythm: Normal rate and regular rhythm.     Pulses: Normal pulses.     Heart sounds: Murmur heard.      Comments: 2/6 Pulmonary:     Effort: Pulmonary effort is normal. No respiratory distress.     Breath sounds: Normal breath sounds.     Comments: 02 dependent  Abdominal:     General: Bowel sounds are normal. There is no distension.     Palpations: Abdomen is soft.     Tenderness: There is no abdominal tenderness.  Musculoskeletal:        General: Normal range of motion.     Cervical back: Neck supple.     Right lower leg: No edema.     Left lower leg: No edema.  Lymphadenopathy:     Cervical: No cervical adenopathy.  Skin:    General: Skin is warm and dry.  Neurological:     Mental Status: She is alert. Mental status is at baseline.  Psychiatric:        Mood and Affect: Mood normal.       ASSESSMENT/ PLAN:  TODAY  1. Vascular  dementia without behavioral disturbances 2. COPD with hypoxia 3. Protein malnutrition severe 4. 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    Time spent with patient: 40 minutes: medications; overall health status; nutritional status.    Ok Edwards NP [facility]  Contact [phone] Monday through Friday 8am- 5pm  After hours call [phone]

## 2021-04-10 DIAGNOSIS — R279 Unspecified lack of coordination: Secondary | ICD-10-CM | POA: Diagnosis not present

## 2021-04-10 DIAGNOSIS — Z9181 History of falling: Secondary | ICD-10-CM | POA: Diagnosis not present

## 2021-04-10 DIAGNOSIS — F0390 Unspecified dementia without behavioral disturbance: Secondary | ICD-10-CM | POA: Diagnosis not present

## 2021-04-10 DIAGNOSIS — R2681 Unsteadiness on feet: Secondary | ICD-10-CM | POA: Diagnosis not present

## 2021-04-10 DIAGNOSIS — J9611 Chronic respiratory failure with hypoxia: Secondary | ICD-10-CM | POA: Diagnosis not present

## 2021-04-10 DIAGNOSIS — M159 Polyosteoarthritis, unspecified: Secondary | ICD-10-CM | POA: Diagnosis not present

## 2021-04-10 DIAGNOSIS — M6281 Muscle weakness (generalized): Secondary | ICD-10-CM | POA: Diagnosis not present

## 2021-04-13 DIAGNOSIS — J9611 Chronic respiratory failure with hypoxia: Secondary | ICD-10-CM | POA: Diagnosis not present

## 2021-04-13 DIAGNOSIS — M159 Polyosteoarthritis, unspecified: Secondary | ICD-10-CM | POA: Diagnosis not present

## 2021-04-13 DIAGNOSIS — Z9181 History of falling: Secondary | ICD-10-CM | POA: Diagnosis not present

## 2021-04-13 DIAGNOSIS — R279 Unspecified lack of coordination: Secondary | ICD-10-CM | POA: Diagnosis not present

## 2021-04-13 DIAGNOSIS — M6281 Muscle weakness (generalized): Secondary | ICD-10-CM | POA: Diagnosis not present

## 2021-04-13 DIAGNOSIS — F0390 Unspecified dementia without behavioral disturbance: Secondary | ICD-10-CM | POA: Diagnosis not present

## 2021-04-13 DIAGNOSIS — R2681 Unsteadiness on feet: Secondary | ICD-10-CM | POA: Diagnosis not present

## 2021-04-14 DIAGNOSIS — Z9181 History of falling: Secondary | ICD-10-CM | POA: Diagnosis not present

## 2021-04-14 DIAGNOSIS — J9611 Chronic respiratory failure with hypoxia: Secondary | ICD-10-CM | POA: Diagnosis not present

## 2021-04-14 DIAGNOSIS — M6281 Muscle weakness (generalized): Secondary | ICD-10-CM | POA: Diagnosis not present

## 2021-04-14 DIAGNOSIS — M159 Polyosteoarthritis, unspecified: Secondary | ICD-10-CM | POA: Diagnosis not present

## 2021-04-14 DIAGNOSIS — F0390 Unspecified dementia without behavioral disturbance: Secondary | ICD-10-CM | POA: Diagnosis not present

## 2021-04-14 DIAGNOSIS — R2681 Unsteadiness on feet: Secondary | ICD-10-CM | POA: Diagnosis not present

## 2021-04-14 DIAGNOSIS — R279 Unspecified lack of coordination: Secondary | ICD-10-CM | POA: Diagnosis not present

## 2021-04-15 DIAGNOSIS — M159 Polyosteoarthritis, unspecified: Secondary | ICD-10-CM | POA: Diagnosis not present

## 2021-04-15 DIAGNOSIS — F0390 Unspecified dementia without behavioral disturbance: Secondary | ICD-10-CM | POA: Diagnosis not present

## 2021-04-15 DIAGNOSIS — Z9181 History of falling: Secondary | ICD-10-CM | POA: Diagnosis not present

## 2021-04-15 DIAGNOSIS — R279 Unspecified lack of coordination: Secondary | ICD-10-CM | POA: Diagnosis not present

## 2021-04-15 DIAGNOSIS — M6281 Muscle weakness (generalized): Secondary | ICD-10-CM | POA: Diagnosis not present

## 2021-04-15 DIAGNOSIS — J9611 Chronic respiratory failure with hypoxia: Secondary | ICD-10-CM | POA: Diagnosis not present

## 2021-04-15 DIAGNOSIS — R2681 Unsteadiness on feet: Secondary | ICD-10-CM | POA: Diagnosis not present

## 2021-04-16 DIAGNOSIS — M6281 Muscle weakness (generalized): Secondary | ICD-10-CM | POA: Diagnosis not present

## 2021-04-16 DIAGNOSIS — F0390 Unspecified dementia without behavioral disturbance: Secondary | ICD-10-CM | POA: Diagnosis not present

## 2021-04-16 DIAGNOSIS — R279 Unspecified lack of coordination: Secondary | ICD-10-CM | POA: Diagnosis not present

## 2021-04-16 DIAGNOSIS — R2681 Unsteadiness on feet: Secondary | ICD-10-CM | POA: Diagnosis not present

## 2021-04-16 DIAGNOSIS — Z9181 History of falling: Secondary | ICD-10-CM | POA: Diagnosis not present

## 2021-04-16 DIAGNOSIS — M159 Polyosteoarthritis, unspecified: Secondary | ICD-10-CM | POA: Diagnosis not present

## 2021-04-16 DIAGNOSIS — J9611 Chronic respiratory failure with hypoxia: Secondary | ICD-10-CM | POA: Diagnosis not present

## 2021-04-17 DIAGNOSIS — F0390 Unspecified dementia without behavioral disturbance: Secondary | ICD-10-CM | POA: Diagnosis not present

## 2021-04-17 DIAGNOSIS — R279 Unspecified lack of coordination: Secondary | ICD-10-CM | POA: Diagnosis not present

## 2021-04-17 DIAGNOSIS — R2681 Unsteadiness on feet: Secondary | ICD-10-CM | POA: Diagnosis not present

## 2021-04-17 DIAGNOSIS — J9611 Chronic respiratory failure with hypoxia: Secondary | ICD-10-CM | POA: Diagnosis not present

## 2021-04-17 DIAGNOSIS — M159 Polyosteoarthritis, unspecified: Secondary | ICD-10-CM | POA: Diagnosis not present

## 2021-04-17 DIAGNOSIS — M6281 Muscle weakness (generalized): Secondary | ICD-10-CM | POA: Diagnosis not present

## 2021-04-17 DIAGNOSIS — Z9181 History of falling: Secondary | ICD-10-CM | POA: Diagnosis not present

## 2021-04-20 DIAGNOSIS — F0390 Unspecified dementia without behavioral disturbance: Secondary | ICD-10-CM | POA: Diagnosis not present

## 2021-04-20 DIAGNOSIS — M159 Polyosteoarthritis, unspecified: Secondary | ICD-10-CM | POA: Diagnosis not present

## 2021-04-20 DIAGNOSIS — R2681 Unsteadiness on feet: Secondary | ICD-10-CM | POA: Diagnosis not present

## 2021-04-20 DIAGNOSIS — Z9181 History of falling: Secondary | ICD-10-CM | POA: Diagnosis not present

## 2021-04-20 DIAGNOSIS — M6281 Muscle weakness (generalized): Secondary | ICD-10-CM | POA: Diagnosis not present

## 2021-04-20 DIAGNOSIS — R279 Unspecified lack of coordination: Secondary | ICD-10-CM | POA: Diagnosis not present

## 2021-04-20 DIAGNOSIS — J9611 Chronic respiratory failure with hypoxia: Secondary | ICD-10-CM | POA: Diagnosis not present

## 2021-04-21 ENCOUNTER — Non-Acute Institutional Stay (SKILLED_NURSING_FACILITY): Payer: Medicare PPO | Admitting: Adult Health

## 2021-04-21 ENCOUNTER — Encounter: Payer: Self-pay | Admitting: Adult Health

## 2021-04-21 DIAGNOSIS — M6281 Muscle weakness (generalized): Secondary | ICD-10-CM | POA: Diagnosis not present

## 2021-04-21 DIAGNOSIS — F0390 Unspecified dementia without behavioral disturbance: Secondary | ICD-10-CM | POA: Diagnosis not present

## 2021-04-21 DIAGNOSIS — K219 Gastro-esophageal reflux disease without esophagitis: Secondary | ICD-10-CM | POA: Diagnosis not present

## 2021-04-21 DIAGNOSIS — R0902 Hypoxemia: Secondary | ICD-10-CM

## 2021-04-21 DIAGNOSIS — I10 Essential (primary) hypertension: Secondary | ICD-10-CM

## 2021-04-21 DIAGNOSIS — J9611 Chronic respiratory failure with hypoxia: Secondary | ICD-10-CM | POA: Diagnosis not present

## 2021-04-21 DIAGNOSIS — Z66 Do not resuscitate: Secondary | ICD-10-CM

## 2021-04-21 DIAGNOSIS — Z9181 History of falling: Secondary | ICD-10-CM | POA: Diagnosis not present

## 2021-04-21 DIAGNOSIS — F0150 Vascular dementia without behavioral disturbance: Secondary | ICD-10-CM | POA: Diagnosis not present

## 2021-04-21 DIAGNOSIS — M159 Polyosteoarthritis, unspecified: Secondary | ICD-10-CM | POA: Diagnosis not present

## 2021-04-21 DIAGNOSIS — R279 Unspecified lack of coordination: Secondary | ICD-10-CM | POA: Diagnosis not present

## 2021-04-21 DIAGNOSIS — R2681 Unsteadiness on feet: Secondary | ICD-10-CM | POA: Diagnosis not present

## 2021-04-21 DIAGNOSIS — J449 Chronic obstructive pulmonary disease, unspecified: Secondary | ICD-10-CM

## 2021-04-21 NOTE — Progress Notes (Signed)
Location:  [place] Room Number: 149-W Place of Service:  SNF (31)   CODE STATUS: DNR  Allergies  Allergen Reactions  . Fenofibrate Nausea And Vomiting  . Pravastatin Sodium Nausea And Vomiting  . Sulfonamide Derivatives Other (See Comments)    unknown  . Ciprofloxacin Rash    Chief Complaint  Patient presents with  . Medical Management of Chronic Issues        COPD with hypoxia:  Essential hypertension:     Esophageal reflux disease without esophagitis:   . Vascular dementia without behavioral disturbance    HPI:  She is a 80 year old short term rehab patient being seen for the management of her chronic illnesses: COPD with hypoxia:  Essential hypertension:     Esophageal reflux disease without esophagitis:   . Vascular dementia without behavioral disturbance.. there are no reports of agitation or anxiety. No reports of heart burn; no changes in appetite; weight is stable.   Past Medical History:  Diagnosis Date  . Abnormality of gait 06/06/2014  . Allergic rhinitis   . Anemia   . Ankle fracture, right   . Anxiety   . Arm fracture, left   . Arthritis    abnormal gait   . COPD (chronic obstructive pulmonary disease) (HCC)    chronic CO2 retention, decreased DLCO   . Cystic acne    adult   . 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 Degenerative disc disease, lumbar    L5 nerve impingement   . Dementia ([place])   . Depression   . DNR (do not resuscitate)   . Fracture of right proximal fibula 07/13/18 09/05/2018  . GERD (gastroesophageal reflux disease)   . Heme positive stool 10/10/2017  . History of colon surgery    right hemicolectomy for high-grade adenomas in right colon 2013  . Hyperglycemia   . Hypertension   . Low back pain   . Mediastinal lymphadenopathy 1/9   resolving   . Memory difficulty 09/20/2014   mild dementia  . Onychomycosis   . OSA on CPAP   . Sleep apnea    stop bang score 6    Past Surgical History:   Procedure Laterality Date  . ABDOMINAL HYSTERECTOMY  1976   secondary to bleeding   . APPENDECTOMY    . COLON RESECTION  08/18/2012   Procedure: HAND ASSISTED LAPAROSCOPIC COLON RESECTION;  Surgeon: Jamesetta So, MD;  Location: [facility];  Service: General;  Laterality: N/A;  . COLONOSCOPY  07/20/2012   Dr. Gala Romney: multiple colonic polyps with large polyps on right side, s/p saline-assisted debulking piecemeal polypectomy and ablation. Not all removed. Path with tubulovillous adenomas, high grade dysplasia.   . COLONOSCOPY N/A 03/09/2013   [id number]:[id number] polyps-tubular adenomas. S/p right hemicolectomy. next tcs 02/2018  . epidural steroids    . ESOPHAGOGASTRODUODENOSCOPY (EGD) WITH ESOPHAGEAL DILATION N/A 02/06/2014   Procedure: ESOPHAGOGASTRODUODENOSCOPY (EGD) WITH ESOPHAGEAL DILATION;  Surgeon: Daneil Dolin, MD;  [facility];  Service: Endoscopy;  Laterality: N/A;  9:30  . OOPHORECTOMY  1976  . ORIF ANKLE FRACTURE  01/18/2012   Procedure: OPEN REDUCTION INTERNAL FIXATION (ORIF) ANKLE FRACTURE;  Surgeon: Arther Abbott, MD;  Location: [facility];  Service: Orthopedics;  Laterality: Left;  . ORIF ANKLE FRACTURE Right 01/13/2016   Procedure: OPEN REDUCTION INTERNAL FIXATION (ORIF) ANKLE FRACTURE;  Surgeon: Carole Civil, MD;  Location: [facility];  Service: Orthopedics;  Laterality: Right;    Social  History   Socioeconomic History  . Marital status: Widowed    Spouse name: Not on file  . Number of children: 4  . Years of education: college 1  . Highest education level: Not on file  Occupational History  . Occupation: employed in the tobacco industry     Employer: RETIRED  Tobacco Use  . Smoking status: Former Smoker    Packs/day: 1.50    Years: 40.00    Pack years: 60.00    Types: Cigarettes    Quit date: 06/21/2006    Years since quitting: 14.8  . Smokeless tobacco: Never Used  Vaping Use  . Vaping Use: Never used  Substance and Sexual Activity  . Alcohol use: No  . Drug  use: No  . Sexual activity: Never  Other Topics Concern  . Not on file  Social History Narrative   Lives at home alone   Right-handed   Drinks 2 or more cups of coffee each morning   Social Determinants of Health   Financial Resource Strain: Low Risk   . Difficulty of Paying Living Expenses: Not hard at all  Food Insecurity: No Food Insecurity  . Worried About Charity fundraiser in the Last Year: Never true  . Ran Out of Food in the Last Year: Never true  Transportation Needs: No Transportation Needs  . Lack of Transportation (Medical): No  . Lack of Transportation (Non-Medical): No  Physical Activity: Inactive  . Days of Exercise per Week: 0 days  . Minutes of Exercise per Session: 0 min  Stress: No Stress Concern Present  . Feeling of Stress : Not at all  Social Connections: Socially Isolated  . Frequency of Communication with Friends and Family: Three times a week  . Frequency of Social Gatherings with Friends and Family: Once a week  . Attends Religious Services: Never  . Active Member of Clubs or Organizations: No  . Attends Archivist Meetings: Never  . Marital Status: Widowed  Intimate Partner Violence: Not At Risk  . Fear of Current or Ex-Partner: No  . Emotionally Abused: No  . Physically Abused: No  . Sexually Abused: No   Family History  Problem Relation Age of Onset  . Stomach cancer Father   . Cancer Father   . Cancer Mother        brain   . Breast cancer Mother   . Arthritis Other       VITAL SIGNS BP 122/82   Pulse 97   Temp 98.1 F (36.7 C)   Resp 20   SpO2 94%   Outpatient Encounter Medications as of 04/21/2021  Medication Sig  . acetaminophen (TYLENOL) 325 MG tablet Take 325 mg by mouth every 8 (eight) hours as needed. Take 2 tabs (650mg  total), oral, Every 8 Hours - PRN, As needed for pain.  [name] albuterol (VENTOLIN HFA) 108 (90 Base) MCG/ACT inhaler Inhale 2 puffs into the lungs every 6 (six) hours as needed for wheezing or  shortness of breath.  [name] alendronate (FOSAMAX) 70 MG tablet TAKE 1 TABLET ONE TIME A WEEK. TAKE WITH A FULL GLASS OF WATER ON AN EMPTY STOMACH.  [name] Amino Acids-Protein Hydrolys (FEEDING SUPPLEMENT, PRO-STAT SUGAR FREE 64,) LIQD Take 30 mLs by mouth 3 (three) times daily with meals.  [name] amLODipine (NORVASC) 2.5 MG tablet Take 1 tablet (2.5 mg total) by mouth daily.  [name] atorvastatin (LIPITOR) 10 MG tablet TAKE 1 TABLET EVERY DAY  . CALCIUM CARBONATE-VITAMIN D PO Take  by mouth. 600 mg-10 mcg, oral, Once A Day  . Cholecalciferol (VITAMIN D3 PO) Take 25 mcg by mouth daily. Take 25 mcg (1,000 Units), oral, Once A Day  . donepezil (ARICEPT) 10 MG tablet TAKE 1 TABLET AT BEDTIME  . hydrOXYzine (ATARAX/VISTARIL) 10 MG tablet Take one tablet by mouth at bedtime for sleep  . loperamide (IMODIUM A-D) 2 MG tablet Take 2 mg by mouth 4 (four) times daily as needed for diarrhea or loose stools.  [name] loratadine (CLARITIN) 10 MG tablet Take 10 mg by mouth daily.  . mirabegron ER (MYRBETRIQ) 25 MG TB24 tablet Take 25 mg by mouth daily.  . mirtazapine (REMERON) 15 MG tablet TAKE 1 TABLET (15 MG TOTAL) BY MOUTH AT BEDTIME.  . NON FORMULARY Diet-regular  . omeprazole (PRILOSEC) 40 MG capsule TAKE 1 CAPSULE EVERY DAY  . OXYGEN Inhale 2 L into the lungs continuous.  . STRIVERDI RESPIMAT 2.5 MCG/ACT AERS INHALE 2 PUFFS ONE TIME DAILY AT THE SAME TIME  . verapamil (CALAN-SR) 240 MG CR tablet TAKE 1 TABLET AT BEDTIME   No facility-administered encounter medications on file as of 04/21/2021.     SIGNIFICANT DIAGNOSTIC EXAMS    LABS REVIEWED PREVIOUS  01-06-21: wbc 6.1; hgb 11.3; hct 37.1; mcv 88; plt 278; glucose 150; bun 24; creat 1.25; k+ 3.2; na++ 148; ca 8.9; GFR 47; liver normal albumin 3.7; chol 152; ldl 61; trig 125; hdl 69; vit D 63.3; tsh 0.407; free T4; 0.84 03-30-21: wbc 5.5; hgb 8.9; hct 30.2; mcv 92.9 plt 210; glucose 78; bun 23; creat 0.86; k+ 3.5; na++ 143; ca 8.6 GFR>60; liver normal albumin 2.9; chol 141;  ldl 65; trig 67; hdl 63  NO NEW LABS.   Review of Systems  Constitutional:  Negative for malaise/fatigue.  Respiratory:  Negative for cough and shortness of breath.   Cardiovascular:  Negative for chest pain, palpitations and leg swelling.  Gastrointestinal:  Negative for abdominal pain, constipation and heartburn.  Musculoskeletal:  Negative for back pain, joint pain and myalgias.  Skin: Negative.   Neurological:  Negative for dizziness.  Psychiatric/Behavioral:  The patient is not nervous/anxious.    Physical Exam Constitutional:      General: She is not in acute distress.    Appearance: She is well-developed. She is not diaphoretic.  Neck:     Thyroid: No thyromegaly.  Cardiovascular:     Rate and Rhythm: Normal rate and regular rhythm.     Pulses: Normal pulses.     Heart sounds: Murmur heard.     Comments: 2/6 Pulmonary:     Effort: Pulmonary effort is normal. No respiratory distress.     Breath sounds: Normal breath sounds.     Comments: 02 dependent  Abdominal:     General: Bowel sounds are normal. There is no distension.     Palpations: Abdomen is soft.     Tenderness: There is no abdominal tenderness.  Musculoskeletal:        General: Normal range of motion.     Cervical back: Neck supple.     Right lower leg: No edema.     Left lower leg: No edema.  Lymphadenopathy:     Cervical: No cervical adenopathy.  Skin:    General: Skin is warm and dry.  Neurological:     Mental Status: She is alert. Mental status is at baseline.  Psychiatric:        Mood and Affect: Mood normal.     ASSESSMENT AND PLAN  TODAY  COPD with hypoxia: is stable is 02 dependent: will continue striverdi respimat 2.5 mcg 2 puffs daily; claritin 10 mg daily as needed; albuterol 2 puffs every 6 hours as needed   2. Essential hypertension: is stable b/p 122/82 will continue norvasc 2.5 mg daily calan sr 240 mg daily   3. Esophageal reflux disease without esophagitis: is stable will  continue prilosec 40 mg daily   4. Vascular dementia without behavioral disturbance: is stable weight i236 pounds; will continue aricept 10 mg nightly    PREVIOUS   5. Generalized osteoarthritis of multiple sites: has tylenol 650 mg every 8 hours as needed  6. 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 Stage 3a chronic kidney disease: is stable bun 23; creat 0.83 will monitor   8. normacytic anemia: is stagle hgb 8.9 will monitor  9. 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 Low thyroid stimulating hormone (TSH); tsh is 0.407 free t4: 0.84 will check free t3  11. Psychophysiological insomnia: is stable will continue atarax 10 mg nightly remeron 15 mg nightly   12. Hyperlipidemia LDL goal <100: ldl 65 will continue lipiotr 10 mg daily   13. OAB: is stable will continue myrbetriq 25 mg nightly   14. Protein calorie malnutrition severe albumin 2.9; will continue prostat and supplements.     Ok Edwards NP [facility]  Contact [phone] Monday through Friday 8am- 5pm  After hours call [phone]

## 2021-04-22 DIAGNOSIS — M159 Polyosteoarthritis, unspecified: Secondary | ICD-10-CM | POA: Diagnosis not present

## 2021-04-22 DIAGNOSIS — F0390 Unspecified dementia without behavioral disturbance: Secondary | ICD-10-CM | POA: Diagnosis not present

## 2021-04-22 DIAGNOSIS — Z9181 History of falling: Secondary | ICD-10-CM | POA: Diagnosis not present

## 2021-04-22 DIAGNOSIS — M6281 Muscle weakness (generalized): Secondary | ICD-10-CM | POA: Diagnosis not present

## 2021-04-22 DIAGNOSIS — R2681 Unsteadiness on feet: Secondary | ICD-10-CM | POA: Diagnosis not present

## 2021-04-22 DIAGNOSIS — J9611 Chronic respiratory failure with hypoxia: Secondary | ICD-10-CM | POA: Diagnosis not present

## 2021-04-22 DIAGNOSIS — R279 Unspecified lack of coordination: Secondary | ICD-10-CM | POA: Diagnosis not present

## 2021-04-23 DIAGNOSIS — J9611 Chronic respiratory failure with hypoxia: Secondary | ICD-10-CM | POA: Diagnosis not present

## 2021-04-23 DIAGNOSIS — Z9181 History of falling: Secondary | ICD-10-CM | POA: Diagnosis not present

## 2021-04-23 DIAGNOSIS — M159 Polyosteoarthritis, unspecified: Secondary | ICD-10-CM | POA: Diagnosis not present

## 2021-04-23 DIAGNOSIS — F0390 Unspecified dementia without behavioral disturbance: Secondary | ICD-10-CM | POA: Diagnosis not present

## 2021-04-23 DIAGNOSIS — R2681 Unsteadiness on feet: Secondary | ICD-10-CM | POA: Diagnosis not present

## 2021-04-23 DIAGNOSIS — M6281 Muscle weakness (generalized): Secondary | ICD-10-CM | POA: Diagnosis not present

## 2021-04-23 DIAGNOSIS — R279 Unspecified lack of coordination: Secondary | ICD-10-CM | POA: Diagnosis not present

## 2021-04-24 DIAGNOSIS — J9611 Chronic respiratory failure with hypoxia: Secondary | ICD-10-CM | POA: Diagnosis not present

## 2021-04-24 DIAGNOSIS — F0390 Unspecified dementia without behavioral disturbance: Secondary | ICD-10-CM | POA: Diagnosis not present

## 2021-04-24 DIAGNOSIS — M6281 Muscle weakness (generalized): Secondary | ICD-10-CM | POA: Diagnosis not present

## 2021-04-24 DIAGNOSIS — R279 Unspecified lack of coordination: Secondary | ICD-10-CM | POA: Diagnosis not present

## 2021-04-24 DIAGNOSIS — M159 Polyosteoarthritis, unspecified: Secondary | ICD-10-CM | POA: Diagnosis not present

## 2021-04-24 DIAGNOSIS — R2681 Unsteadiness on feet: Secondary | ICD-10-CM | POA: Diagnosis not present

## 2021-04-24 DIAGNOSIS — Z9181 History of falling: Secondary | ICD-10-CM | POA: Diagnosis not present

## 2021-04-27 DIAGNOSIS — R279 Unspecified lack of coordination: Secondary | ICD-10-CM | POA: Diagnosis not present

## 2021-04-27 DIAGNOSIS — M159 Polyosteoarthritis, unspecified: Secondary | ICD-10-CM | POA: Diagnosis not present

## 2021-04-27 DIAGNOSIS — R2681 Unsteadiness on feet: Secondary | ICD-10-CM | POA: Diagnosis not present

## 2021-04-27 DIAGNOSIS — M6281 Muscle weakness (generalized): Secondary | ICD-10-CM | POA: Diagnosis not present

## 2021-04-27 DIAGNOSIS — Z9181 History of falling: Secondary | ICD-10-CM | POA: Diagnosis not present

## 2021-04-27 DIAGNOSIS — F0390 Unspecified dementia without behavioral disturbance: Secondary | ICD-10-CM | POA: Diagnosis not present

## 2021-04-27 DIAGNOSIS — J9611 Chronic respiratory failure with hypoxia: Secondary | ICD-10-CM | POA: Diagnosis not present

## 2021-04-28 DIAGNOSIS — M159 Polyosteoarthritis, unspecified: Secondary | ICD-10-CM | POA: Diagnosis not present

## 2021-04-28 DIAGNOSIS — R279 Unspecified lack of coordination: Secondary | ICD-10-CM | POA: Diagnosis not present

## 2021-04-28 DIAGNOSIS — R2681 Unsteadiness on feet: Secondary | ICD-10-CM | POA: Diagnosis not present

## 2021-04-28 DIAGNOSIS — M6281 Muscle weakness (generalized): Secondary | ICD-10-CM | POA: Diagnosis not present

## 2021-04-28 DIAGNOSIS — F0390 Unspecified dementia without behavioral disturbance: Secondary | ICD-10-CM | POA: Diagnosis not present

## 2021-04-28 DIAGNOSIS — J9611 Chronic respiratory failure with hypoxia: Secondary | ICD-10-CM | POA: Diagnosis not present

## 2021-04-28 DIAGNOSIS — Z9181 History of falling: Secondary | ICD-10-CM | POA: Diagnosis not present

## 2021-04-29 ENCOUNTER — Encounter (HOSPITAL_COMMUNITY)
Admission: AD | Admit: 2021-04-29 | Discharge: 2021-04-29 | Disposition: A | Discharge status: Home / Self Care | Payer: Medicare PPO | Source: Skilled Nursing Facility | Attending: Internal Medicine | Admitting: Internal Medicine

## 2021-04-29 DIAGNOSIS — N390 Urinary tract infection, site not specified: Secondary | ICD-10-CM | POA: Diagnosis not present

## 2021-04-29 DIAGNOSIS — Z9181 History of falling: Secondary | ICD-10-CM | POA: Diagnosis not present

## 2021-04-29 DIAGNOSIS — R279 Unspecified lack of coordination: Secondary | ICD-10-CM | POA: Diagnosis not present

## 2021-04-29 DIAGNOSIS — F0390 Unspecified dementia without behavioral disturbance: Secondary | ICD-10-CM | POA: Diagnosis not present

## 2021-04-29 DIAGNOSIS — M159 Polyosteoarthritis, unspecified: Secondary | ICD-10-CM | POA: Diagnosis not present

## 2021-04-29 DIAGNOSIS — R2681 Unsteadiness on feet: Secondary | ICD-10-CM | POA: Diagnosis not present

## 2021-04-29 DIAGNOSIS — J9611 Chronic respiratory failure with hypoxia: Secondary | ICD-10-CM | POA: Diagnosis not present

## 2021-04-29 DIAGNOSIS — M6281 Muscle weakness (generalized): Secondary | ICD-10-CM | POA: Diagnosis not present

## 2021-04-29 LAB — URINALYSIS, ROUTINE W REFLEX MICROSCOPIC
Bacteria, UA: NONE SEEN
Bilirubin Urine: NEGATIVE
Glucose, UA: NEGATIVE mg/dL
Hgb urine dipstick: NEGATIVE
Ketones, ur: NEGATIVE mg/dL
Nitrite: NEGATIVE
Protein, ur: NEGATIVE mg/dL
Specific Gravity, Urine: 1.023 (ref 1.005–1.030)
pH: 5 (ref 5.0–8.0)

## 2021-04-30 ENCOUNTER — Other Ambulatory Visit (HOSPITAL_COMMUNITY)
Admission: RE | Admit: 2021-04-30 | Discharge: 2021-04-30 | Disposition: A | Discharge status: Home / Self Care | Payer: Medicare PPO | Source: Skilled Nursing Facility | Attending: Adult Health | Admitting: Adult Health

## 2021-04-30 DIAGNOSIS — M6281 Muscle weakness (generalized): Secondary | ICD-10-CM | POA: Diagnosis not present

## 2021-04-30 DIAGNOSIS — Z9181 History of falling: Secondary | ICD-10-CM | POA: Diagnosis not present

## 2021-04-30 DIAGNOSIS — F0390 Unspecified dementia without behavioral disturbance: Secondary | ICD-10-CM | POA: Diagnosis not present

## 2021-04-30 DIAGNOSIS — R279 Unspecified lack of coordination: Secondary | ICD-10-CM | POA: Diagnosis not present

## 2021-04-30 DIAGNOSIS — M159 Polyosteoarthritis, unspecified: Secondary | ICD-10-CM | POA: Diagnosis not present

## 2021-04-30 DIAGNOSIS — R2681 Unsteadiness on feet: Secondary | ICD-10-CM | POA: Diagnosis not present

## 2021-04-30 DIAGNOSIS — J9611 Chronic respiratory failure with hypoxia: Secondary | ICD-10-CM | POA: Diagnosis not present

## 2021-05-01 LAB — URINE CULTURE

## 2021-05-01 LAB — OCCULT BLOOD X 1 CARD TO LAB, STOOL: Fecal Occult Bld: NEGATIVE

## 2021-05-07 ENCOUNTER — Other Ambulatory Visit (HOSPITAL_COMMUNITY)
Admission: RE | Admit: 2021-05-07 | Discharge: 2021-05-07 | Disposition: A | Discharge status: Home / Self Care | Payer: Medicare PPO | Source: Skilled Nursing Facility | Attending: Adult Health | Admitting: Adult Health

## 2021-05-07 DIAGNOSIS — N390 Urinary tract infection, site not specified: Secondary | ICD-10-CM | POA: Diagnosis not present

## 2021-05-07 LAB — URINALYSIS, ROUTINE W REFLEX MICROSCOPIC
Bilirubin Urine: NEGATIVE
Glucose, UA: NEGATIVE mg/dL
Hgb urine dipstick: NEGATIVE
Ketones, ur: NEGATIVE mg/dL
Leukocytes,Ua: NEGATIVE
Nitrite: NEGATIVE
Protein, ur: NEGATIVE mg/dL
Specific Gravity, Urine: 1.023 (ref 1.005–1.030)
pH: 6 (ref 5.0–8.0)

## 2021-05-09 LAB — URINE CULTURE: Culture: NO GROWTH

## 2021-05-13 ENCOUNTER — Non-Acute Institutional Stay (SKILLED_NURSING_FACILITY): Payer: Medicare PPO | Admitting: Adult Health

## 2021-05-13 ENCOUNTER — Encounter: Payer: Self-pay | Admitting: Adult Health

## 2021-05-13 DIAGNOSIS — L0232 Furuncle of buttock: Secondary | ICD-10-CM | POA: Diagnosis not present

## 2021-05-13 DIAGNOSIS — J9611 Chronic respiratory failure with hypoxia: Secondary | ICD-10-CM | POA: Diagnosis not present

## 2021-05-13 DIAGNOSIS — Z1159 Encounter for screening for other viral diseases: Secondary | ICD-10-CM | POA: Diagnosis not present

## 2021-05-13 NOTE — Progress Notes (Signed)
Location:  [place] Room Number: 149 Place of Service:  SNF (31)   CODE STATUS: dnr   Allergies  Allergen Reactions  . Fenofibrate Nausea And Vomiting  . Pravastatin Sodium Nausea And Vomiting  . Sulfonamide Derivatives Other (See Comments)    unknown  . Ciprofloxacin Rash    Chief Complaint  Patient presents with  . Acute Visit    Boil left buttock     HPI:  She was found to have a large boil on her left buttock. There are no reports of fevers present. The area is hard red warm and slightly tender to palpation. There is no active drainage present.   Past Medical History:  Diagnosis Date  . Abnormality of gait 06/06/2014  . Allergic rhinitis   . Anemia   . Ankle fracture, right   . Anxiety   . Arm fracture, left   . Arthritis    abnormal gait   . COPD (chronic obstructive pulmonary disease) (HCC)    chronic CO2 retention, decreased DLCO   . Cystic acne    adult   . 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 Degenerative disc disease, lumbar    L5 nerve impingement   . Dementia ([place])   . Depression   . DNR (do not resuscitate)   . Fracture of right proximal fibula 07/13/18 09/05/2018  . GERD (gastroesophageal reflux disease)   . Heme positive stool 10/10/2017  . History of colon surgery    right hemicolectomy for high-grade adenomas in right colon 2013  . Hyperglycemia   . Hypertension   . Low back pain   . Mediastinal lymphadenopathy 1/9   resolving   . Memory difficulty 09/20/2014   mild dementia  . Onychomycosis   . OSA on CPAP   . Sleep apnea    stop bang score 6    Past Surgical History:  Procedure Laterality Date  . ABDOMINAL HYSTERECTOMY  1976   secondary to bleeding   . APPENDECTOMY    . COLON RESECTION  08/18/2012   Procedure: HAND ASSISTED LAPAROSCOPIC COLON RESECTION;  Surgeon: Jamesetta So, MD;  Location: [facility];  Service: General;  Laterality: N/A;  . COLONOSCOPY  07/20/2012   Dr. Gala Romney: multiple  colonic polyps with large polyps on right side, s/p saline-assisted debulking piecemeal polypectomy and ablation. Not all removed. Path with tubulovillous adenomas, high grade dysplasia.   . COLONOSCOPY N/A 03/09/2013   [id number] polyps-tubular adenomas. S/p right hemicolectomy. next tcs 02/2018  . epidural steroids    . ESOPHAGOGASTRODUODENOSCOPY (EGD) WITH ESOPHAGEAL DILATION N/A 02/06/2014   Procedure: ESOPHAGOGASTRODUODENOSCOPY (EGD) WITH ESOPHAGEAL DILATION;  Surgeon: Daneil Dolin, MD;  Location: [facility];  Service: Endoscopy;  Laterality: N/A;  9:30  . OOPHORECTOMY  1976  . ORIF ANKLE FRACTURE  01/18/2012   Procedure: OPEN REDUCTION INTERNAL FIXATION (ORIF) ANKLE FRACTURE;  Surgeon: Arther Abbott, MD;  Location: [facility];  Service: Orthopedics;  Laterality: Left;  . ORIF ANKLE FRACTURE Right 01/13/2016   Procedure: OPEN REDUCTION INTERNAL FIXATION (ORIF) ANKLE FRACTURE;  Surgeon: Carole Civil, MD;  Location: [facility];  Service: Orthopedics;  Laterality: Right;    Social History   Socioeconomic History  . Marital status: Widowed    Spouse name: Not on file  . Number of children: 4  . Years of education: college 1  . Highest education level: Not on file  Occupational History  . Occupation: employed in the tobacco industry  Employer: RETIRED  Tobacco Use  . Smoking status: Former    Packs/day: 1.50    Years: 40.00    Pack years: 60.00    Types: Cigarettes    Quit date: 06/21/2006    Years since quitting: 14.9  . Smokeless tobacco: Never  Vaping Use  . Vaping Use: Never used  Substance and Sexual Activity  . Alcohol use: No  . Drug use: No  . Sexual activity: Never  Other Topics Concern  . Not on file  Social History Narrative   Lives at home alone   Right-handed   Drinks 2 or more cups of coffee each morning   Social Determinants of Health   Financial Resource Strain: Low Risk   . Difficulty of Paying Living Expenses: Not hard at all  Food Insecurity: No  Food Insecurity  . Worried About Charity fundraiser in the Last Year: Never true  . Ran Out of Food in the Last Year: Never true  Transportation Needs: No Transportation Needs  . Lack of Transportation (Medical): No  . Lack of Transportation (Non-Medical): No  Physical Activity: Inactive  . Days of Exercise per Week: 0 days  . Minutes of Exercise per Session: 0 min  Stress: No Stress Concern Present  . Feeling of Stress : Not at all  Social Connections: Socially Isolated  . Frequency of Communication with Friends and Family: Three times a week  . Frequency of Social Gatherings with Friends and Family: Once a week  . Attends Religious Services: Never  . Active Member of Clubs or Organizations: No  . Attends Archivist Meetings: Never  . Marital Status: Widowed  Intimate Partner Violence: Not At Risk  . Fear of Current or Ex-Partner: No  . Emotionally Abused: No  . Physically Abused: No  . Sexually Abused: No   Family History  Problem Relation Age of Onset  . Stomach cancer Father   . Cancer Father   . Cancer Mother        brain   . Breast cancer Mother   . Arthritis Other       VITAL SIGNS BP (!) 114/57   Pulse 100   Temp 98.1 F (36.7 C)   Resp 18   Ht 6\' 1"  (1.854 m)   Wt 239 lb (108.4 kg)   BMI 31.53 kg/m   Outpatient Encounter Medications as of 05/13/2021  Medication Sig  . acetaminophen (TYLENOL) 325 MG tablet Take 325 mg by mouth every 8 (eight) hours as needed. Take 2 tabs (650mg  total), oral, Every 8 Hours - PRN, As needed for pain.  [name] albuterol (VENTOLIN HFA) 108 (90 Base) MCG/ACT inhaler Inhale 2 puffs into the lungs every 6 (six) hours as needed for wheezing or shortness of breath.  [name] alendronate (FOSAMAX) 70 MG tablet TAKE 1 TABLET ONE TIME A WEEK. TAKE WITH A FULL GLASS OF WATER ON AN EMPTY STOMACH.  [name] Amino Acids-Protein Hydrolys (FEEDING SUPPLEMENT, PRO-STAT SUGAR FREE 64,) LIQD Take 30 mLs by mouth 3 (three) times daily with meals.  [name]  amLODipine (NORVASC) 2.5 MG tablet Take 1 tablet (2.5 mg total) by mouth daily.  [name] atorvastatin (LIPITOR) 10 MG tablet TAKE 1 TABLET EVERY DAY  . CALCIUM CARBONATE-VITAMIN D PO Take by mouth. 600 mg-10 mcg, oral, Once A Day  . Cholecalciferol (VITAMIN D3 PO) Take 25 mcg by mouth daily. Take 25 mcg (1,000 Units), oral, Once A Day  . donepezil (ARICEPT) 10 MG tablet TAKE 1 TABLET  AT BEDTIME  . hydrOXYzine (ATARAX/VISTARIL) 10 MG tablet Take one tablet by mouth at bedtime for sleep  . loperamide (IMODIUM A-D) 2 MG tablet Take 2 mg by mouth 4 (four) times daily as needed for diarrhea or loose stools.  [name] loratadine (CLARITIN) 10 MG tablet Take 10 mg by mouth daily.  . mirabegron ER (MYRBETRIQ) 25 MG TB24 tablet Take 25 mg by mouth daily.  . mirtazapine (REMERON) 15 MG tablet TAKE 1 TABLET (15 MG TOTAL) BY MOUTH AT BEDTIME.  . NON FORMULARY Diet-regular  . omeprazole (PRILOSEC) 40 MG capsule TAKE 1 CAPSULE EVERY DAY  . OXYGEN Inhale 2 L into the lungs continuous.  . STRIVERDI RESPIMAT 2.5 MCG/ACT AERS INHALE 2 PUFFS ONE TIME DAILY AT THE SAME TIME  . verapamil (CALAN-SR) 240 MG CR tablet TAKE 1 TABLET AT BEDTIME   No facility-administered encounter medications on file as of 05/13/2021.     SIGNIFICANT DIAGNOSTIC EXAMS   LABS REVIEWED PREVIOUS  01-06-21: wbc 6.1; hgb 11.3; hct 37.1; mcv 88; plt 278; glucose 150; bun 24; creat 1.25; k+ 3.2; na++ 148; ca 8.9; GFR 47; liver normal albumin 3.7; chol 152; ldl 61; trig 125; hdl 69; vit D 63.3; tsh 0.407; free T4; 0.84 03-30-21: wbc 5.5; hgb 8.9; hct 30.2; mcv 92.9 plt 210; glucose 78; bun 23; creat 0.86; k+ 3.5; na++ 143; ca 8.6 GFR>60; liver normal albumin 2.9; chol 141; ldl 65; trig 67; hdl 63  NO NEW LABS.   Review of Systems  Constitutional:  Negative for malaise/fatigue.  Respiratory:  Negative for cough and shortness of breath.   Cardiovascular:  Negative for chest pain, palpitations and leg swelling.  Gastrointestinal:  Negative for  abdominal pain, constipation and heartburn.  Musculoskeletal:  Negative for back pain, joint pain and myalgias.  Skin:        Left buttock boil   Neurological:  Negative for dizziness.  Psychiatric/Behavioral:  The patient is not nervous/anxious.    Physical Exam Constitutional:      General: She is not in acute distress.    Appearance: She is well-developed. She is not diaphoretic.  Neck:     Thyroid: No thyromegaly.  Cardiovascular:     Rate and Rhythm: Normal rate and regular rhythm.     Pulses: Normal pulses.     Heart sounds: Murmur heard.     Comments: /6 Pulmonary:     Effort: Pulmonary effort is normal. No respiratory distress.     Breath sounds: Normal breath sounds.     Comments: 02 dependent  Abdominal:     General: Bowel sounds are normal. There is no distension.     Palpations: Abdomen is soft.     Tenderness: There is no abdominal tenderness.  Musculoskeletal:        General: Normal range of motion.     Cervical back: Neck supple.     Right lower leg: No edema.     Left lower leg: No edema.  Lymphadenopathy:     Cervical: No cervical adenopathy.  Skin:    General: Skin is warm and dry.     Comments: Left buttock boil: large red warm tender to touch; no active drainage present; unable to express drainage.   Neurological:     Mental Status: She is alert. Mental status is at baseline.  Psychiatric:        Mood and Affect: Mood normal.     ASSESSMENT/ PLAN:  TODAY  Left buttock boil: is new: will begin  keflex 250 mg every 6 hours through 05-20-21; will have Dr. Arnoldo Morale see for further interventions. Will monitor her status.    Ok Edwards NP [facility]  Contact [phone] Monday through Friday 8am- 5pm  After hours call [phone]

## 2021-05-15 DIAGNOSIS — L0232 Furuncle of buttock: Secondary | ICD-10-CM | POA: Insufficient documentation

## 2021-05-19 DIAGNOSIS — Z1159 Encounter for screening for other viral diseases: Secondary | ICD-10-CM | POA: Diagnosis not present

## 2021-05-19 DIAGNOSIS — J9611 Chronic respiratory failure with hypoxia: Secondary | ICD-10-CM | POA: Diagnosis not present

## 2021-05-21 DIAGNOSIS — Z1159 Encounter for screening for other viral diseases: Secondary | ICD-10-CM | POA: Diagnosis not present

## 2021-05-21 DIAGNOSIS — J9611 Chronic respiratory failure with hypoxia: Secondary | ICD-10-CM | POA: Diagnosis not present

## 2021-05-26 DIAGNOSIS — Z1159 Encounter for screening for other viral diseases: Secondary | ICD-10-CM | POA: Diagnosis not present

## 2021-05-26 DIAGNOSIS — J9611 Chronic respiratory failure with hypoxia: Secondary | ICD-10-CM | POA: Diagnosis not present

## 2021-05-27 ENCOUNTER — Non-Acute Institutional Stay (SKILLED_NURSING_FACILITY): Payer: Medicare PPO | Admitting: Adult Health

## 2021-05-27 ENCOUNTER — Encounter: Payer: Self-pay | Admitting: Adult Health

## 2021-05-27 DIAGNOSIS — M159 Polyosteoarthritis, unspecified: Secondary | ICD-10-CM

## 2021-05-27 DIAGNOSIS — D649 Anemia, unspecified: Secondary | ICD-10-CM | POA: Diagnosis not present

## 2021-05-27 DIAGNOSIS — M810 Age-related osteoporosis without current pathological fracture: Secondary | ICD-10-CM | POA: Diagnosis not present

## 2021-05-27 DIAGNOSIS — N1831 Chronic kidney disease, stage 3a: Secondary | ICD-10-CM | POA: Diagnosis not present

## 2021-05-27 NOTE — Progress Notes (Signed)
Location:  [place] Room Number: 149-W Place of Service:  SNF (31)   CODE STATUS: DNR  Allergies  Allergen Reactions   Fenofibrate Nausea And Vomiting   Pravastatin Sodium Nausea And Vomiting   Sulfonamide Derivatives Other (See Comments)    unknown   Ciprofloxacin Rash    Chief Complaint  Patient presents with   Medical Management of Chronic Issues            Generalized osteoarthritis of multiple sites:  Age related osteoporosis without current pathological fracture:  Stage 3a chronic kidney disease: . Normocytic anemia:    HPI:  She is a 80 year old long term resident of this facility being seen for the management of her chronic illnesses: Generalized osteoarthritis of multiple sites:  Age related osteoporosis without current pathological fracture:  Stage 3a chronic kidney disease: . Normocytic anemia:. There are no reports of uncontrolled pain. She has had several falls without injury. There are no reports of anxiety. She has a boil on her left buttock. Dr Arnoldo Morale has consulted and does not recommend any surgical intervention at this time.   Past Medical History:  Diagnosis Date   Abnormality of gait 06/06/2014   Allergic rhinitis    Anemia    Ankle fracture, right    Anxiety    Arm fracture, left    Arthritis    abnormal gait    COPD (chronic obstructive pulmonary disease) (HCC)    chronic CO2 retention, decreased DLCO    Cystic acne    adult    Degenerative disc disease, cervical    syrinx C3-7   Degenerative disc disease, lumbar    L5 nerve impingement    Dementia (HCC)    Depression    DNR (do not resuscitate)    Fracture of right proximal fibula 07/13/18 09/05/2018   GERD (gastroesophageal reflux disease)    Heme positive stool 10/10/2017   History of colon surgery    right hemicolectomy for high-grade adenomas in right colon 2013   Hyperglycemia    Hypertension    Low back pain    Mediastinal lymphadenopathy 1/9   resolving     Memory difficulty 09/20/2014   mild dementia   Onychomycosis    OSA on CPAP    Sleep apnea    stop bang score 6    Past Surgical History:  Procedure Laterality Date   ABDOMINAL HYSTERECTOMY  1976   secondary to bleeding    APPENDECTOMY     COLON RESECTION  08/18/2012   Procedure: HAND ASSISTED LAPAROSCOPIC COLON RESECTION;  Surgeon: Jamesetta So, MD;  Location: [facility];  Service: General;  Laterality: N/A;   COLONOSCOPY  07/20/2012   Dr. Gala Romney: multiple colonic polyps with large polyps on right side, s/p saline-assisted debulking piecemeal polypectomy and ablation. Not all removed. Path with tubulovillous adenomas, high grade dysplasia.    COLONOSCOPY N/A 03/09/2013   [id number] polyps-tubular adenomas. S/p right hemicolectomy. next tcs 02/2018   epidural steroids     ESOPHAGOGASTRODUODENOSCOPY (EGD) WITH ESOPHAGEAL DILATION N/A 02/06/2014   Procedure: ESOPHAGOGASTRODUODENOSCOPY (EGD) WITH ESOPHAGEAL DILATION;  Surgeon: Daneil Dolin, MD;  [facility];  Service: Endoscopy;  Laterality: N/A;  9:30   OOPHORECTOMY  1976   ORIF ANKLE FRACTURE  01/18/2012   Procedure: OPEN REDUCTION INTERNAL FIXATION (ORIF) ANKLE FRACTURE;  Surgeon: Arther Abbott, MD;  Location: [facility];  Service: Orthopedics;  Laterality: Left;   ORIF ANKLE FRACTURE Right 01/13/2016   Procedure: OPEN  REDUCTION INTERNAL FIXATION (ORIF) ANKLE FRACTURE;  Surgeon: Carole Civil, MD;  Location: [facility];  Service: Orthopedics;  Laterality: Right;    Social History   Socioeconomic History   Marital status: Widowed    Spouse name: Not on file   Number of children: 4   Years of education: college 1   Highest education level: Not on file  Occupational History   Occupation: employed in the tobacco industry     Employer: RETIRED  Tobacco Use   Smoking status: Former    Packs/day: 1.50    Years: 40.00    Pack years: 60.00    Types: Cigarettes    Quit date: 06/21/2006    Years since quitting: 14.9   Smokeless  tobacco: Never  Vaping Use   Vaping Use: Never used  Substance and Sexual Activity   Alcohol use: No   Drug use: No   Sexual activity: Never  Other Topics Concern   Not on file  Social History Narrative   Lives at home alone   Right-handed   Drinks 2 or more cups of coffee each morning   Social Determinants of Health   Financial Resource Strain: Low Risk    Difficulty of Paying Living Expenses: Not hard at all  Food Insecurity: No Food Insecurity   Worried About Charity fundraiser in the Last Year: Never true   Arboriculturist in the Last Year: Never true  Transportation Needs: No Transportation Needs   Lack of Transportation (Medical): No   Lack of Transportation (Non-Medical): No  Physical Activity: Inactive   Days of Exercise per Week: 0 days   Minutes of Exercise per Session: 0 min  Stress: No Stress Concern Present   Feeling of Stress : Not at all  Social Connections: Socially Isolated   Frequency of Communication with Friends and Family: Three times a week   Frequency of Social Gatherings with Friends and Family: Once a week   Attends Religious Services: Never   Marine scientist or Organizations: No   Attends Archivist Meetings: Never   Marital Status: Widowed  Human resources officer Violence: Not At Risk   Fear of Current or Ex-Partner: No   Emotionally Abused: No   Physically Abused: No   Sexually Abused: No   Family History  Problem Relation Age of Onset   Stomach cancer Father    Cancer Father    Cancer Mother        brain    Breast cancer Mother    Arthritis Other       VITAL SIGNS BP (!) 99/50   Pulse (!) 101   Temp 98.7 F (37.1 C)   Resp 20   Ht 6\' 1"  (1.854 m)   Wt 170 lb 3.2 oz (77.2 kg)   SpO2 94%   BMI 22.46 kg/m   Outpatient Encounter Medications as of 05/27/2021  Medication Sig   acetaminophen (TYLENOL) 325 MG tablet Take 325 mg by mouth every 8 (eight) hours as needed. Take 2 tabs (650mg  total), oral, Every 8 Hours -  PRN, As needed for pain.   albuterol (VENTOLIN HFA) 108 (90 Base) MCG/ACT inhaler Inhale 2 puffs into the lungs every 6 (six) hours as needed for wheezing or shortness of breath.   alendronate (FOSAMAX) 70 MG tablet TAKE 1 TABLET ONE TIME A WEEK. TAKE WITH A FULL GLASS OF WATER ON AN EMPTY STOMACH.   Amino Acids-Protein Hydrolys (FEEDING SUPPLEMENT, PRO-STAT SUGAR FREE 64,)  LIQD Take 30 mLs by mouth 3 (three) times daily with meals.   amLODipine (NORVASC) 2.5 MG tablet Take 1 tablet (2.5 mg total) by mouth daily.   atorvastatin (LIPITOR) 10 MG tablet TAKE 1 TABLET EVERY DAY   CALCIUM CARBONATE-VITAMIN D PO Take by mouth. 600 mg-10 mcg, oral, Once A Day   Cholecalciferol (VITAMIN D3 PO) Take 25 mcg by mouth daily. Take 25 mcg (1,000 Units), oral, Once A Day   donepezil (ARICEPT) 10 MG tablet TAKE 1 TABLET AT BEDTIME   hydrOXYzine (ATARAX/VISTARIL) 10 MG tablet Take one tablet by mouth at bedtime for sleep   loperamide (IMODIUM A-D) 2 MG tablet Take 2 mg by mouth 4 (four) times daily as needed for diarrhea or loose stools.   loratadine (CLARITIN) 10 MG tablet Take 10 mg by mouth daily.   mirabegron ER (MYRBETRIQ) 25 MG TB24 tablet Take 25 mg by mouth daily.   mirtazapine (REMERON) 15 MG tablet TAKE 1 TABLET (15 MG TOTAL) BY MOUTH AT BEDTIME.   NON FORMULARY Diet-regular   omeprazole (PRILOSEC) 40 MG capsule TAKE 1 CAPSULE EVERY DAY   OXYGEN Inhale 2 L into the lungs continuous.   STRIVERDI RESPIMAT 2.5 MCG/ACT AERS INHALE 2 PUFFS ONE TIME DAILY AT THE SAME TIME   verapamil (CALAN-SR) 240 MG CR tablet TAKE 1 TABLET AT BEDTIME   No facility-administered encounter medications on file as of 05/27/2021.     SIGNIFICANT DIAGNOSTIC EXAMS   LABS REVIEWED PREVIOUS  01-06-21: wbc 6.1; hgb 11.3; hct 37.1; mcv 88; plt 278; glucose 150; bun 24; creat 1.25; k+ 3.2; na++ 148; ca 8.9; GFR 47; liver normal albumin 3.7; chol 152; ldl 61; trig 125; hdl 69; vit D 63.3; tsh 0.407; free T4; 0.84 03-30-21: wbc  5.5; hgb 8.9; hct 30.2; mcv 92.9 plt 210; glucose 78; bun 23; creat 0.86; k+ 3.5; na++ 143; ca 8.6 GFR>60; liver normal albumin 2.9; chol 141; ldl 65; trig 67; hdl 63  NO NEW LABS.   Review of Systems  Constitutional:  Negative for malaise/fatigue.  Respiratory:  Negative for cough and shortness of breath.   Cardiovascular:  Negative for chest pain, palpitations and leg swelling.  Gastrointestinal:  Negative for abdominal pain, constipation and heartburn.  Musculoskeletal:  Negative for back pain, joint pain and myalgias.  Skin: Negative.   Neurological:  Negative for dizziness.  Psychiatric/Behavioral:  The patient is not nervous/anxious.      Physical Exam Constitutional:      General: She is not in acute distress.    Appearance: She is well-developed. She is not diaphoretic.  Neck:     Thyroid: No thyromegaly.  Cardiovascular:     Rate and Rhythm: Normal rate and regular rhythm.     Pulses: Normal pulses.     Heart sounds: Murmur heard.     Comments: 2/6 Pulmonary:     Effort: Pulmonary effort is normal. No respiratory distress.     Breath sounds: Normal breath sounds.     Comments: 02 dependent  Abdominal:     General: Bowel sounds are normal. There is no distension.     Palpations: Abdomen is soft.     Tenderness: There is no abdominal tenderness.  Musculoskeletal:        General: Normal range of motion.     Cervical back: Neck supple.     Right lower leg: No edema.     Left lower leg: No edema.  Lymphadenopathy:     Cervical: No cervical adenopathy.  Skin:    General: Skin is warm and dry.     Comments: Abscess/cyst on left buttock without signs of infection present.   Neurological:     Mental Status: She is alert and oriented to person, place, and time.     ASSESSMENT AND PLAN   TODAY   Generalized osteoarthritis of multiple sites: is stable has tylenol 650 mg every 8 hours as needed  2. Age related osteoporosis without current pathological fracture: is  stable will continue fosamax 70 mg weekly   3. Stage 3a chronic kidney disease: is stable bun 23; creat 0.83; GFR >60  4. Normocytic anemia: is stable hgb 8.9 will monitor   PREVIOUS   5. 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 Low thyroid stimulating hormone (TSH); tsh is 0.407 free t4: 0.84 will check free t3  7. Psychophysiological insomnia: is stable will continue atarax 10 mg nightly remeron 15 mg nightly   8. Hyperlipidemia LDL goal <100: ldl 65 will continue lipiotr 10 mg daily   9. OAB: is stable will continue myrbetriq 25 mg nightly   10. Protein calorie malnutrition severe albumin 2.9; will continue prostat and supplements.   11. COPD with hypoxia: is stable is 02 dependent: will continue striverdi respimat 2.5 mcg 2 puffs daily; claritin 10 mg daily as needed; albuterol 2 puffs every 6 hours as needed   12. Essential hypertension: is stable b/p 99/50 will continue norvasc 2.5 mg daily calan sr 240 mg daily   13. Esophageal reflux disease without esophagitis: is stable will continue prilosec 40 mg daily   14. Vascular dementia without behavioral disturbance: is stable weight 170 pounds; will continue aricept 10 mg nightly     Ok Edwards NP [facility]  Contact [phone] Monday through Friday 8am- 5pm  After hours call [phone]

## 2021-05-28 DIAGNOSIS — Z1159 Encounter for screening for other viral diseases: Secondary | ICD-10-CM | POA: Diagnosis not present

## 2021-05-28 DIAGNOSIS — Z9181 History of falling: Secondary | ICD-10-CM | POA: Diagnosis not present

## 2021-05-28 DIAGNOSIS — R2681 Unsteadiness on feet: Secondary | ICD-10-CM | POA: Diagnosis not present

## 2021-05-28 DIAGNOSIS — M159 Polyosteoarthritis, unspecified: Secondary | ICD-10-CM | POA: Diagnosis not present

## 2021-05-28 DIAGNOSIS — M6281 Muscle weakness (generalized): Secondary | ICD-10-CM | POA: Diagnosis not present

## 2021-05-28 DIAGNOSIS — R279 Unspecified lack of coordination: Secondary | ICD-10-CM | POA: Diagnosis not present

## 2021-05-28 DIAGNOSIS — J9611 Chronic respiratory failure with hypoxia: Secondary | ICD-10-CM | POA: Diagnosis not present

## 2021-05-28 DIAGNOSIS — F0390 Unspecified dementia without behavioral disturbance: Secondary | ICD-10-CM | POA: Diagnosis not present

## 2021-05-29 DIAGNOSIS — F0390 Unspecified dementia without behavioral disturbance: Secondary | ICD-10-CM | POA: Diagnosis not present

## 2021-05-29 DIAGNOSIS — Z9181 History of falling: Secondary | ICD-10-CM | POA: Diagnosis not present

## 2021-05-29 DIAGNOSIS — R279 Unspecified lack of coordination: Secondary | ICD-10-CM | POA: Diagnosis not present

## 2021-05-29 DIAGNOSIS — M159 Polyosteoarthritis, unspecified: Secondary | ICD-10-CM | POA: Diagnosis not present

## 2021-05-29 DIAGNOSIS — R2681 Unsteadiness on feet: Secondary | ICD-10-CM | POA: Diagnosis not present

## 2021-05-29 DIAGNOSIS — M6281 Muscle weakness (generalized): Secondary | ICD-10-CM | POA: Diagnosis not present

## 2021-06-01 DIAGNOSIS — F0390 Unspecified dementia without behavioral disturbance: Secondary | ICD-10-CM | POA: Diagnosis not present

## 2021-06-01 DIAGNOSIS — M159 Polyosteoarthritis, unspecified: Secondary | ICD-10-CM | POA: Diagnosis not present

## 2021-06-01 DIAGNOSIS — Z9181 History of falling: Secondary | ICD-10-CM | POA: Diagnosis not present

## 2021-06-01 DIAGNOSIS — M6281 Muscle weakness (generalized): Secondary | ICD-10-CM | POA: Diagnosis not present

## 2021-06-01 DIAGNOSIS — R279 Unspecified lack of coordination: Secondary | ICD-10-CM | POA: Diagnosis not present

## 2021-06-01 DIAGNOSIS — R2681 Unsteadiness on feet: Secondary | ICD-10-CM | POA: Diagnosis not present

## 2021-06-02 ENCOUNTER — Encounter (HOSPITAL_COMMUNITY)
Admission: AD | Admit: 2021-06-02 | Discharge: 2021-06-02 | Disposition: A | Discharge status: Home / Self Care | Payer: Medicare PPO | Source: Skilled Nursing Facility | Attending: Internal Medicine | Admitting: Internal Medicine

## 2021-06-02 ENCOUNTER — Encounter: Payer: Self-pay | Admitting: Adult Health

## 2021-06-02 ENCOUNTER — Non-Acute Institutional Stay: Payer: Self-pay | Admitting: Adult Health

## 2021-06-02 DIAGNOSIS — M159 Polyosteoarthritis, unspecified: Secondary | ICD-10-CM | POA: Diagnosis not present

## 2021-06-02 DIAGNOSIS — M6281 Muscle weakness (generalized): Secondary | ICD-10-CM | POA: Diagnosis not present

## 2021-06-02 DIAGNOSIS — J9611 Chronic respiratory failure with hypoxia: Secondary | ICD-10-CM | POA: Diagnosis not present

## 2021-06-02 DIAGNOSIS — Z9181 History of falling: Secondary | ICD-10-CM | POA: Diagnosis not present

## 2021-06-02 DIAGNOSIS — Z1159 Encounter for screening for other viral diseases: Secondary | ICD-10-CM | POA: Diagnosis not present

## 2021-06-02 DIAGNOSIS — R4182 Altered mental status, unspecified: Secondary | ICD-10-CM | POA: Insufficient documentation

## 2021-06-02 DIAGNOSIS — R279 Unspecified lack of coordination: Secondary | ICD-10-CM | POA: Diagnosis not present

## 2021-06-02 DIAGNOSIS — F0390 Unspecified dementia without behavioral disturbance: Secondary | ICD-10-CM | POA: Diagnosis not present

## 2021-06-02 DIAGNOSIS — Z0000 Encounter for general adult medical examination without abnormal findings: Secondary | ICD-10-CM

## 2021-06-02 DIAGNOSIS — R2681 Unsteadiness on feet: Secondary | ICD-10-CM | POA: Diagnosis not present

## 2021-06-02 LAB — URINALYSIS, ROUTINE W REFLEX MICROSCOPIC
Bilirubin Urine: NEGATIVE
Glucose, UA: NEGATIVE mg/dL
Hgb urine dipstick: NEGATIVE
Ketones, ur: NEGATIVE mg/dL
Leukocytes,Ua: NEGATIVE
Nitrite: NEGATIVE
Protein, ur: NEGATIVE mg/dL
Specific Gravity, Urine: 1.014 (ref 1.005–1.030)
pH: 5 (ref 5.0–8.0)

## 2021-06-02 NOTE — Progress Notes (Signed)
Subjective:   [name] is a 80 y.o. female who presents for Medicare Annual (Subsequent) preventive examination.  Review of Systems    Review of Systems  Constitutional:  Negative for malaise/fatigue.  Respiratory:  Negative for cough and shortness of breath.   Cardiovascular:  Negative for chest pain, palpitations and leg swelling.  Gastrointestinal:  Negative for abdominal pain, constipation and heartburn.  Musculoskeletal:  Negative for back pain, joint pain and myalgias.  Skin: Negative.   Neurological:  Negative for dizziness.  Psychiatric/Behavioral:  The patient is not nervous/anxious.    Cardiac Risk Factors include: advanced age (>16men, >30 women);hypertension;dyslipidemia     Objective:    Today's Vitals   06/02/21 1104  BP: (!) 99/50  Pulse: 100  Resp: 20  Temp: 98.2 F (36.8 C)  SpO2: 100%  Weight: 170 lb 3.2 oz (77.2 kg)  Height: 6\' 1"  (1.854 m)   Body mass index is 22.46 kg/m.  Advanced Directives 05/27/2021 04/21/2021 04/09/2021 04/03/2021 03/30/2021 03/26/2021 12/02/2020  Does Patient Have a Medical Advance Directive? Yes Yes Yes Yes Yes Yes Yes  Type of Advance Directive Out of facility DNR (pink MOST or yellow form) Out of facility DNR (pink MOST or yellow form) Out of facility DNR (pink MOST or yellow form) Out of facility DNR (pink MOST or yellow form) Out of facility DNR (pink MOST or yellow form) Out of facility DNR (pink MOST or yellow form) Healthcare Power of Attorney  Does patient want to make changes to medical advance directive? No - Patient declined No - Patient declined No - Patient declined No - Patient declined No - Patient declined No - Patient declined -  Copy of [place] in Chart? - - - - - - No - copy requested  Would patient like information on creating a medical advance directive? - - - - - No - Patient declined No - Patient declined  Pre-existing out of facility DNR order (yellow form or pink MOST form) Pink  MOST/Yellow Form most recent copy in chart - Physician notified to receive inpatient order - - - - - -    Current Medications (verified) Outpatient Encounter Medications as of 06/02/2021  Medication Sig   acetaminophen (TYLENOL) 325 MG tablet Take 325 mg by mouth every 8 (eight) hours as needed. Take 2 tabs (650mg  total), oral, Every 8 Hours - PRN, As needed for pain.   albuterol (VENTOLIN HFA) 108 (90 Base) MCG/ACT inhaler Inhale 2 puffs into the lungs every 6 (six) hours as needed for wheezing or shortness of breath.   alendronate (FOSAMAX) 70 MG tablet TAKE 1 TABLET ONE TIME A WEEK. TAKE WITH A FULL GLASS OF WATER ON AN EMPTY STOMACH.   Amino Acids-Protein Hydrolys (FEEDING SUPPLEMENT, PRO-STAT SUGAR FREE 64,) LIQD Take 30 mLs by mouth 3 (three) times daily with meals.   amLODipine (NORVASC) 2.5 MG tablet Take 1 tablet (2.5 mg total) by mouth daily.   atorvastatin (LIPITOR) 10 MG tablet TAKE 1 TABLET EVERY DAY   CALCIUM CARBONATE-VITAMIN D PO Take by mouth. 600 mg-10 mcg, oral, Once A Day   Cholecalciferol (VITAMIN D3 PO) Take 25 mcg by mouth daily. Take 25 mcg (1,000 Units), oral, Once A Day   donepezil (ARICEPT) 10 MG tablet TAKE 1 TABLET AT BEDTIME   hydrOXYzine (ATARAX/VISTARIL) 10 MG tablet Take one tablet by mouth at bedtime for sleep   loperamide (IMODIUM A-D) 2 MG tablet Take 2 mg by mouth 4 (four) times daily  as needed for diarrhea or loose stools.   loratadine (CLARITIN) 10 MG tablet Take 10 mg by mouth daily.   mirabegron ER (MYRBETRIQ) 25 MG TB24 tablet Take 25 mg by mouth daily.   mirtazapine (REMERON) 15 MG tablet TAKE 1 TABLET (15 MG TOTAL) BY MOUTH AT BEDTIME.   NON FORMULARY Diet-regular   omeprazole (PRILOSEC) 40 MG capsule TAKE 1 CAPSULE EVERY DAY   OXYGEN Inhale 2 L into the lungs continuous.   STRIVERDI RESPIMAT 2.5 MCG/ACT AERS INHALE 2 PUFFS ONE TIME DAILY AT THE SAME TIME   verapamil (CALAN-SR) 240 MG CR tablet TAKE 1 TABLET AT BEDTIME   No facility-administered  encounter medications on file as of 06/02/2021.    Allergies (verified) Fenofibrate, Pravastatin sodium, Sulfonamide derivatives, and Ciprofloxacin   History: Past Medical History:  Diagnosis Date   Abnormality of gait 06/06/2014   Allergic rhinitis    Anemia    Ankle fracture, right    Anxiety    Arm fracture, left    Arthritis    abnormal gait    COPD (chronic obstructive pulmonary disease) (HCC)    chronic CO2 retention, decreased DLCO    Cystic acne    adult    Degenerative disc disease, cervical    syrinx C3-7   Degenerative disc disease, lumbar    L5 nerve impingement    Dementia (HCC)    Depression    DNR (do not resuscitate)    Fracture of right proximal fibula 07/13/18 09/05/2018   GERD (gastroesophageal reflux disease)    Heme positive stool 10/10/2017   History of colon surgery    right hemicolectomy for high-grade adenomas in right colon 2013   Hyperglycemia    Hypertension    Low back pain    Mediastinal lymphadenopathy 1/9   resolving    Memory difficulty 09/20/2014   mild dementia   Onychomycosis    OSA on CPAP    Sleep apnea    stop bang score 6   Past Surgical History:  Procedure Laterality Date   ABDOMINAL HYSTERECTOMY  1976   secondary to bleeding    APPENDECTOMY     COLON RESECTION  08/18/2012   Procedure: HAND ASSISTED LAPAROSCOPIC COLON RESECTION;  Surgeon: Jamesetta So, MD;  Location: [facility];  Service: General;  Laterality: N/A;   COLONOSCOPY  07/20/2012   Dr. Gala Romney: multiple colonic polyps with large polyps on right side, s/p saline-assisted debulking piecemeal polypectomy and ablation. Not all removed. Path with tubulovillous adenomas, high grade dysplasia.    COLONOSCOPY N/A 03/09/2013   ULA:GTXMIWO polyps-tubular adenomas. S/p right hemicolectomy. next tcs 02/2018   epidural steroids     ESOPHAGOGASTRODUODENOSCOPY (EGD) WITH ESOPHAGEAL DILATION N/A 02/06/2014   Procedure: ESOPHAGOGASTRODUODENOSCOPY (EGD) WITH ESOPHAGEAL DILATION;  Surgeon:  Daneil Dolin, MD;  Location: [facility];  Service: Endoscopy;  Laterality: N/A;  9:30   OOPHORECTOMY  1976   ORIF ANKLE FRACTURE  01/18/2012   Procedure: OPEN REDUCTION INTERNAL FIXATION (ORIF) ANKLE FRACTURE;  Surgeon: Arther Abbott, MD;  Location: [facility];  Service: Orthopedics;  Laterality: Left;   ORIF ANKLE FRACTURE Right 01/13/2016   Procedure: OPEN REDUCTION INTERNAL FIXATION (ORIF) ANKLE FRACTURE;  Surgeon: Carole Civil, MD;  Location: [facility];  Service: Orthopedics;  Laterality: Right;   Family History  Problem Relation Age of Onset   Stomach cancer Father    Cancer Father    Cancer Mother        brain    Breast cancer  Mother    Arthritis Other    Social History   Socioeconomic History   Marital status: Widowed    Spouse name: Not on file   Number of children: 4   Years of education: college 1   Highest education level: Not on file  Occupational History   Occupation: employed in the tobacco industry     Employer: RETIRED   Occupation: retired  Tobacco Use   Smoking status: Former    Packs/day: 1.50    Years: 40.00    Pack years: 60.00    Types: Cigarettes    Quit date: 06/21/2006    Years since quitting: 14.9   Smokeless tobacco: Never  Vaping Use   Vaping Use: Never used  Substance and Sexual Activity   Alcohol use: No   Drug use: No   Sexual activity: Not Currently  Other Topics Concern   Not on file  Social History Narrative   [facility]    Social Determinants of Health   Financial Resource Strain: Low Risk    Difficulty of Paying Living Expenses: Not hard at all  Food Insecurity: No Food Insecurity   Worried About Charity fundraiser in the Last Year: Never true   [place] in the Last Year: Never true  Transportation Needs: No Transportation Needs   Lack of Transportation (Medical): No   Lack of Transportation (Non-Medical): No  Physical Activity: Inactive   Days of Exercise per Week: 0 days   Minutes of Exercise  per Session: 0 min  Stress: No Stress Concern Present   Feeling of Stress : Not at all  Social Connections: Socially Isolated   Frequency of Communication with Friends and Family: Three times a week   Frequency of Social Gatherings with Friends and Family: Once a week   Attends Religious Services: Never   Marine scientist or Organizations: No   Attends Archivist Meetings: Never   Marital Status: Widowed    Tobacco Counseling Counseling given: Not Answered   Clinical Intake:  Pre-visit preparation completed: Yes  Pain : No/denies pain     BMI - recorded: 22.46 Nutritional Status: BMI of 19-24  Normal Nutritional Risks: Unintentional weight loss Diabetes: No  How often do you need to have someone help you when you read instructions, pamphlets, or other written materials from your doctor or pharmacy?: 5 - Always  Diabetic?no  Interpreter Needed?: No      Activities of Daily Living In your present state of health, do you have any difficulty performing the following activities: 06/02/2021 12/02/2020  Hearing? N N  Vision? N N  Difficulty concentrating or making decisions? Tempie Donning  Walking or climbing stairs? Y Y  Dressing or bathing? Y Y  Doing errands, shopping? Tempie Donning  Preparing Food and eating ? Y Y  Using the Toilet? Y Y  In the past six months, have you accidently leaked urine? Y N  Do you have problems with loss of bowel control? Y N  Managing your Medications? Y Y  Managing your Finances? Tempie Donning  Housekeeping or managing your Housekeeping? Tempie Donning  Some recent data might be hidden    Patient Care Team: Gerlene Fee, NP as PCP - General (Geriatric Medicine) Josue Hector, MD as Attending Physician (Cardiology) Kathrynn Ducking, MD as Consulting Physician (Neurology) Sinda Du, MD as Consulting Physician (Pulmonary Disease) Carole Civil, MD as Consulting Physician (Orthopedic Surgery)  Indicate any  recent Medical Services you may  have received from other than [facility] providers in the past year (date may be approximate).     Assessment:   This is a routine wellness examination for [place].  Hearing/Vision screen No results found.  Dietary issues and exercise activities discussed: Current Exercise Habits: The patient does not participate in regular exercise at present, Exercise limited by: None identified   Goals Addressed             This Visit's Progress    Exercise 3x per week (30 min per time)   On track    Ambulate daily in hallway      Follow up with Primary Care Provider       General - Client will not be readmitted within 30 days ([facility])         Depression Screen PHQ 2/9 Scores 06/02/2021 01/06/2021 12/02/2020 07/14/2020 12/18/2019 07/10/2019 02/27/2019  PHQ - 2 Score 0 0 0 0 0 0 0  PHQ- 9 Score 0 - - - - - -    Fall Risk Fall Risk  06/02/2021 01/06/2021 12/02/2020 07/14/2020 07/10/2019  Falls in the past year? 0 1 0 1 1  Number falls in past yr: 0 1 0 0 1  Injury with Fall? 0 1 0 1 1  Risk Factor Category  - - - - -  Risk for fall due to : Impaired balance/gait Impaired mobility;Impaired balance/gait No Fall Risks History of fall(s);Impaired balance/gait;Impaired mobility -  Follow up - Falls evaluation completed Falls evaluation completed Falls evaluation completed;Education provided;Falls prevention discussed -    FALL RISK PREVENTION PERTAINING TO THE HOME:  Any stairs in or around the home? No  If so, are there any without handrails?  N/a Home free of loose throw rugs in walkways, pet beds, electrical cords, etc? Yes  Adequate lighting in your home to reduce risk of falls? Yes   ASSISTIVE DEVICES UTILIZED TO PREVENT FALLS:  Life alert? No  Use of a cane, walker or w/c? Yes  Grab bars in the bathroom? Yes  Shower chair or bench in shower? Yes  Elevated toilet seat or a handicapped toilet? Yes   TIMED UP AND GO:  Was the test performed? Yes .  Length of time to ambulate 10 feet: 10  sec.    Gait slow and steady with assistive device  Cognitive Function: MMSE - Mini Mental State Exam 06/02/2021 03/10/2017 04/21/2016 10/21/2015 09/20/2014  Orientation to time 1 3 5 3 4   Orientation to Place 0 5 4 3 3   Registration 3 3 3 3 3   Attention/ Calculation 0 4 4 2 5   Recall 2 1 2  0 2  Language- name 2 objects 2 2 2 2 2   Language- repeat 1 1 1 1 1   Language- follow 3 step command 3 3 3 3 3   Language- read & follow direction 1 1 1 1 1   Write a sentence 1 1 1 1 1   Copy design 0 1 1 1 1   Total score 14 25 27 20 26      6CIT Screen 06/02/2021 02/27/2019 02/20/2018 11/10/2016  What Year? 0 points 0 points 0 points 0 points  What month? 3 points 0 points 0 points 0 points  What time? 0 points 0 points 0 points 0 points  Count back from 20 4 points 4 points 0 points 0 points  Months in reverse 4 points 2 points 0 points 0 points  Repeat phrase 10 points 10 points 4  points 0 points  Total Score 21 16 4  0    Immunizations Immunization History  Administered Date(s) Administered   Fluad Quad(high Dose 65+) 07/10/2019, 08/22/2020   Influenza Split 08/25/2011, 07/25/2012   Influenza Whole 09/12/2006, 08/25/2007, 08/01/2008, 08/20/2009, 08/20/2010   Influenza, High Dose Seasonal PF 12/13/2018   Influenza,inj,Quad PF,6+ Mos 08/21/2013, 09/04/2014, 08/13/2015, 07/08/2016, 09/23/2017   Moderna Sars-Covid-2 Vaccination 01/10/2020, 02/08/2020   PFIZER Comirnaty(Gray Top)Covid-19 Tri-Sucrose Vaccine 09/23/2020, 03/05/2021   Pneumococcal Conjugate-13 06/20/2014   Pneumococcal Polysaccharide-23 09/12/2006, 08/20/2010, 04/16/2011   Td 08/20/2010   Zoster, Live 12/26/2013    TDAP status: Up to date  Flu Vaccine status: Up to date  Pneumococcal vaccine status: Up to date  Covid-19 vaccine status: Completed vaccines  Qualifies for Shingles Vaccine? No   Zostavax completed No   Shingrix Completed?: No.    Education has been provided regarding the importance of this vaccine. Patient has been  advised to call insurance company to determine out of pocket expense if they have not yet received this vaccine. Advised may also receive vaccine at local pharmacy or Health Dept. Verbalized acceptance and understanding.  Screening Tests Health Maintenance  Topic Date Due   Hepatitis C Screening  Never done   Zoster Vaccines- Shingrix (1 of 2) Never done   COLONOSCOPY (Pts 45-90yrs Insurance coverage will need to be confirmed)  03/09/2018   TETANUS/TDAP  08/20/2020   INFLUENZA VACCINE  06/15/2021   DEXA SCAN  Completed   COVID-19 Vaccine  Completed   PNA vac Low Risk Adult  Completed   HPV VACCINES  Aged Out    Health Maintenance  Health Maintenance Due  Topic Date Due   Hepatitis C Screening  Never done   Zoster Vaccines- Shingrix (1 of 2) Never done   COLONOSCOPY (Pts 45-74yrs Insurance coverage will need to be confirmed)  03/09/2018   TETANUS/TDAP  08/20/2020    Colorectal cancer screening: Type of screening: Cologuard. Completed n/a. Repeat every n/a years  Mammogram status: Ordered  . Pt provided with contact info and advised to call to schedule appt.   Bone Density status: Ordered  . Pt provided with contact info and advised to call to schedule appt.  Lung Cancer Screening: (Low Dose CT Chest recommended if Age 46-80 years, 30 pack-year currently smoking OR have quit w/in 15years.) does not qualify.   Lung Cancer Screening Referral: n/a  Additional Screening:  Hepatitis C Screening: does not qualify; Completed   Vision Screening: Recommended annual ophthalmology exams for early detection of glaucoma and other disorders of the eye. Is the patient up to date with their annual eye exam?  Yes  Who is the provider or what is the name of the office in which the patient attends annual eye exams?  If pt is not established with a provider, would they like to be referred to a provider to establish care?  N/a .   Dental Screening: Recommended annual dental exams for proper  oral hygiene  Community Resource Referral / Chronic Care Management: CRR required this visit?  No   CCM required this visit?  No      Plan:     I have personally reviewed and noted the following in the patient's chart:   Medical and social history Use of alcohol, tobacco or illicit drugs  Current medications and supplements including opioid prescriptions.  Functional ability and status Nutritional status Physical activity Advanced directives List of other physicians Hospitalizations, surgeries, and ER visits in previous 12 months Vitals  Screenings to include cognitive, depression, and falls Referrals and appointments  In addition, I have reviewed and discussed with patient certain preventive protocols, quality metrics, and best practice recommendations. A written personalized care plan for preventive services as well as general preventive health recommendations were provided to patient.     Gerlene Fee, NP   06/02/2021   Nurse Notes:

## 2021-06-02 NOTE — Patient Instructions (Addendum)
  Ms. Lizaola , Thank you for taking time to come for your Medicare Wellness Visit. I appreciate your ongoing commitment to your health goals. Please review the following plan we discussed and let me know if I can assist you in the future.   These are the goals we discussed:  Goals      Exercise 3x per week (30 min per time)     Ambulate daily in hallway      Follow up with Primary Care Provider     General - Client will not be readmitted within 30 days (C-[facility])        This is a list of the screening recommended for you and due dates:  Health Maintenance  Topic Date Due   Hepatitis C Screening: USPSTF Recommendation to screen - Ages 76-79 yo.  Never done   Zoster (Shingles) Vaccine (1 of 2) Never done   Colon Cancer Screening  03/09/2018   Tetanus Vaccine  08/20/2020   Flu Shot  06/15/2021   DEXA scan (bone density measurement)  Completed   COVID-19 Vaccine  Completed   Pneumonia vaccines  Completed   HPV Vaccine  Aged Out

## 2021-06-04 ENCOUNTER — Other Ambulatory Visit (HOSPITAL_COMMUNITY)
Admission: RE | Admit: 2021-06-04 | Discharge: 2021-06-04 | Disposition: A | Discharge status: Home / Self Care | Payer: Medicare PPO | Source: Skilled Nursing Facility | Attending: Adult Health | Admitting: Adult Health

## 2021-06-04 DIAGNOSIS — M159 Polyosteoarthritis, unspecified: Secondary | ICD-10-CM | POA: Diagnosis not present

## 2021-06-04 DIAGNOSIS — R279 Unspecified lack of coordination: Secondary | ICD-10-CM | POA: Diagnosis not present

## 2021-06-04 DIAGNOSIS — R2681 Unsteadiness on feet: Secondary | ICD-10-CM | POA: Diagnosis not present

## 2021-06-04 DIAGNOSIS — Z9181 History of falling: Secondary | ICD-10-CM | POA: Diagnosis not present

## 2021-06-04 DIAGNOSIS — Z0000 Encounter for general adult medical examination without abnormal findings: Secondary | ICD-10-CM | POA: Insufficient documentation

## 2021-06-04 DIAGNOSIS — M6281 Muscle weakness (generalized): Secondary | ICD-10-CM | POA: Diagnosis not present

## 2021-06-04 DIAGNOSIS — J9611 Chronic respiratory failure with hypoxia: Secondary | ICD-10-CM | POA: Diagnosis not present

## 2021-06-04 DIAGNOSIS — Z1159 Encounter for screening for other viral diseases: Secondary | ICD-10-CM | POA: Diagnosis not present

## 2021-06-04 DIAGNOSIS — F0390 Unspecified dementia without behavioral disturbance: Secondary | ICD-10-CM | POA: Diagnosis not present

## 2021-06-04 LAB — URINE CULTURE: Culture: NO GROWTH

## 2021-06-04 LAB — HEPATITIS C ANTIBODY: HCV Ab: NONREACTIVE

## 2021-06-05 DIAGNOSIS — M159 Polyosteoarthritis, unspecified: Secondary | ICD-10-CM | POA: Diagnosis not present

## 2021-06-05 DIAGNOSIS — Z9181 History of falling: Secondary | ICD-10-CM | POA: Diagnosis not present

## 2021-06-05 DIAGNOSIS — M6281 Muscle weakness (generalized): Secondary | ICD-10-CM | POA: Diagnosis not present

## 2021-06-05 DIAGNOSIS — F0390 Unspecified dementia without behavioral disturbance: Secondary | ICD-10-CM | POA: Diagnosis not present

## 2021-06-05 DIAGNOSIS — R2681 Unsteadiness on feet: Secondary | ICD-10-CM | POA: Diagnosis not present

## 2021-06-05 DIAGNOSIS — R279 Unspecified lack of coordination: Secondary | ICD-10-CM | POA: Diagnosis not present

## 2021-06-08 DIAGNOSIS — R2681 Unsteadiness on feet: Secondary | ICD-10-CM | POA: Diagnosis not present

## 2021-06-08 DIAGNOSIS — R279 Unspecified lack of coordination: Secondary | ICD-10-CM | POA: Diagnosis not present

## 2021-06-08 DIAGNOSIS — M6281 Muscle weakness (generalized): Secondary | ICD-10-CM | POA: Diagnosis not present

## 2021-06-08 DIAGNOSIS — Z9181 History of falling: Secondary | ICD-10-CM | POA: Diagnosis not present

## 2021-06-08 DIAGNOSIS — M159 Polyosteoarthritis, unspecified: Secondary | ICD-10-CM | POA: Diagnosis not present

## 2021-06-08 DIAGNOSIS — F0390 Unspecified dementia without behavioral disturbance: Secondary | ICD-10-CM | POA: Diagnosis not present

## 2021-06-09 DIAGNOSIS — F0390 Unspecified dementia without behavioral disturbance: Secondary | ICD-10-CM | POA: Diagnosis not present

## 2021-06-09 DIAGNOSIS — Z9181 History of falling: Secondary | ICD-10-CM | POA: Diagnosis not present

## 2021-06-09 DIAGNOSIS — Z1159 Encounter for screening for other viral diseases: Secondary | ICD-10-CM | POA: Diagnosis not present

## 2021-06-09 DIAGNOSIS — R279 Unspecified lack of coordination: Secondary | ICD-10-CM | POA: Diagnosis not present

## 2021-06-09 DIAGNOSIS — M6281 Muscle weakness (generalized): Secondary | ICD-10-CM | POA: Diagnosis not present

## 2021-06-09 DIAGNOSIS — R2681 Unsteadiness on feet: Secondary | ICD-10-CM | POA: Diagnosis not present

## 2021-06-09 DIAGNOSIS — J9611 Chronic respiratory failure with hypoxia: Secondary | ICD-10-CM | POA: Diagnosis not present

## 2021-06-09 DIAGNOSIS — M159 Polyosteoarthritis, unspecified: Secondary | ICD-10-CM | POA: Diagnosis not present

## 2021-06-10 DIAGNOSIS — Z9181 History of falling: Secondary | ICD-10-CM | POA: Diagnosis not present

## 2021-06-10 DIAGNOSIS — M159 Polyosteoarthritis, unspecified: Secondary | ICD-10-CM | POA: Diagnosis not present

## 2021-06-10 DIAGNOSIS — F0390 Unspecified dementia without behavioral disturbance: Secondary | ICD-10-CM | POA: Diagnosis not present

## 2021-06-10 DIAGNOSIS — M6281 Muscle weakness (generalized): Secondary | ICD-10-CM | POA: Diagnosis not present

## 2021-06-10 DIAGNOSIS — R2681 Unsteadiness on feet: Secondary | ICD-10-CM | POA: Diagnosis not present

## 2021-06-10 DIAGNOSIS — R279 Unspecified lack of coordination: Secondary | ICD-10-CM | POA: Diagnosis not present

## 2021-06-11 DIAGNOSIS — M159 Polyosteoarthritis, unspecified: Secondary | ICD-10-CM | POA: Diagnosis not present

## 2021-06-11 DIAGNOSIS — R2681 Unsteadiness on feet: Secondary | ICD-10-CM | POA: Diagnosis not present

## 2021-06-11 DIAGNOSIS — R279 Unspecified lack of coordination: Secondary | ICD-10-CM | POA: Diagnosis not present

## 2021-06-11 DIAGNOSIS — Z1159 Encounter for screening for other viral diseases: Secondary | ICD-10-CM | POA: Diagnosis not present

## 2021-06-11 DIAGNOSIS — J9611 Chronic respiratory failure with hypoxia: Secondary | ICD-10-CM | POA: Diagnosis not present

## 2021-06-11 DIAGNOSIS — F0390 Unspecified dementia without behavioral disturbance: Secondary | ICD-10-CM | POA: Diagnosis not present

## 2021-06-11 DIAGNOSIS — Z9181 History of falling: Secondary | ICD-10-CM | POA: Diagnosis not present

## 2021-06-11 DIAGNOSIS — M6281 Muscle weakness (generalized): Secondary | ICD-10-CM | POA: Diagnosis not present

## 2021-06-12 DIAGNOSIS — Z9181 History of falling: Secondary | ICD-10-CM | POA: Diagnosis not present

## 2021-06-12 DIAGNOSIS — R279 Unspecified lack of coordination: Secondary | ICD-10-CM | POA: Diagnosis not present

## 2021-06-12 DIAGNOSIS — M159 Polyosteoarthritis, unspecified: Secondary | ICD-10-CM | POA: Diagnosis not present

## 2021-06-12 DIAGNOSIS — F0390 Unspecified dementia without behavioral disturbance: Secondary | ICD-10-CM | POA: Diagnosis not present

## 2021-06-12 DIAGNOSIS — M6281 Muscle weakness (generalized): Secondary | ICD-10-CM | POA: Diagnosis not present

## 2021-06-12 DIAGNOSIS — R2681 Unsteadiness on feet: Secondary | ICD-10-CM | POA: Diagnosis not present

## 2021-06-15 DIAGNOSIS — M6281 Muscle weakness (generalized): Secondary | ICD-10-CM | POA: Diagnosis not present

## 2021-06-15 DIAGNOSIS — M159 Polyosteoarthritis, unspecified: Secondary | ICD-10-CM | POA: Diagnosis not present

## 2021-06-15 DIAGNOSIS — L602 Onychogryphosis: Secondary | ICD-10-CM | POA: Diagnosis not present

## 2021-06-15 DIAGNOSIS — I739 Peripheral vascular disease, unspecified: Secondary | ICD-10-CM | POA: Diagnosis not present

## 2021-06-15 DIAGNOSIS — R279 Unspecified lack of coordination: Secondary | ICD-10-CM | POA: Diagnosis not present

## 2021-06-15 DIAGNOSIS — F0390 Unspecified dementia without behavioral disturbance: Secondary | ICD-10-CM | POA: Diagnosis not present

## 2021-06-15 DIAGNOSIS — Z9181 History of falling: Secondary | ICD-10-CM | POA: Diagnosis not present

## 2021-06-15 DIAGNOSIS — M24572 Contracture, left ankle: Secondary | ICD-10-CM | POA: Diagnosis not present

## 2021-06-15 DIAGNOSIS — M24571 Contracture, right ankle: Secondary | ICD-10-CM | POA: Diagnosis not present

## 2021-06-16 DIAGNOSIS — Z9181 History of falling: Secondary | ICD-10-CM | POA: Diagnosis not present

## 2021-06-16 DIAGNOSIS — F0390 Unspecified dementia without behavioral disturbance: Secondary | ICD-10-CM | POA: Diagnosis not present

## 2021-06-16 DIAGNOSIS — J9611 Chronic respiratory failure with hypoxia: Secondary | ICD-10-CM | POA: Diagnosis not present

## 2021-06-16 DIAGNOSIS — M6281 Muscle weakness (generalized): Secondary | ICD-10-CM | POA: Diagnosis not present

## 2021-06-16 DIAGNOSIS — M159 Polyosteoarthritis, unspecified: Secondary | ICD-10-CM | POA: Diagnosis not present

## 2021-06-16 DIAGNOSIS — R279 Unspecified lack of coordination: Secondary | ICD-10-CM | POA: Diagnosis not present

## 2021-06-16 DIAGNOSIS — Z1159 Encounter for screening for other viral diseases: Secondary | ICD-10-CM | POA: Diagnosis not present

## 2021-06-17 DIAGNOSIS — R279 Unspecified lack of coordination: Secondary | ICD-10-CM | POA: Diagnosis not present

## 2021-06-17 DIAGNOSIS — F0390 Unspecified dementia without behavioral disturbance: Secondary | ICD-10-CM | POA: Diagnosis not present

## 2021-06-17 DIAGNOSIS — M159 Polyosteoarthritis, unspecified: Secondary | ICD-10-CM | POA: Diagnosis not present

## 2021-06-17 DIAGNOSIS — Z9181 History of falling: Secondary | ICD-10-CM | POA: Diagnosis not present

## 2021-06-17 DIAGNOSIS — M6281 Muscle weakness (generalized): Secondary | ICD-10-CM | POA: Diagnosis not present

## 2021-06-18 DIAGNOSIS — M159 Polyosteoarthritis, unspecified: Secondary | ICD-10-CM | POA: Diagnosis not present

## 2021-06-18 DIAGNOSIS — R279 Unspecified lack of coordination: Secondary | ICD-10-CM | POA: Diagnosis not present

## 2021-06-18 DIAGNOSIS — M6281 Muscle weakness (generalized): Secondary | ICD-10-CM | POA: Diagnosis not present

## 2021-06-18 DIAGNOSIS — Z9181 History of falling: Secondary | ICD-10-CM | POA: Diagnosis not present

## 2021-06-18 DIAGNOSIS — F0390 Unspecified dementia without behavioral disturbance: Secondary | ICD-10-CM | POA: Diagnosis not present

## 2021-06-19 DIAGNOSIS — Z9181 History of falling: Secondary | ICD-10-CM | POA: Diagnosis not present

## 2021-06-19 DIAGNOSIS — R279 Unspecified lack of coordination: Secondary | ICD-10-CM | POA: Diagnosis not present

## 2021-06-19 DIAGNOSIS — F0390 Unspecified dementia without behavioral disturbance: Secondary | ICD-10-CM | POA: Diagnosis not present

## 2021-06-19 DIAGNOSIS — M159 Polyosteoarthritis, unspecified: Secondary | ICD-10-CM | POA: Diagnosis not present

## 2021-06-19 DIAGNOSIS — M6281 Muscle weakness (generalized): Secondary | ICD-10-CM | POA: Diagnosis not present

## 2021-06-22 DIAGNOSIS — R279 Unspecified lack of coordination: Secondary | ICD-10-CM | POA: Diagnosis not present

## 2021-06-22 DIAGNOSIS — M6281 Muscle weakness (generalized): Secondary | ICD-10-CM | POA: Diagnosis not present

## 2021-06-22 DIAGNOSIS — F0390 Unspecified dementia without behavioral disturbance: Secondary | ICD-10-CM | POA: Diagnosis not present

## 2021-06-22 DIAGNOSIS — Z9181 History of falling: Secondary | ICD-10-CM | POA: Diagnosis not present

## 2021-06-22 DIAGNOSIS — M159 Polyosteoarthritis, unspecified: Secondary | ICD-10-CM | POA: Diagnosis not present

## 2021-06-23 DIAGNOSIS — M159 Polyosteoarthritis, unspecified: Secondary | ICD-10-CM | POA: Diagnosis not present

## 2021-06-23 DIAGNOSIS — Z9181 History of falling: Secondary | ICD-10-CM | POA: Diagnosis not present

## 2021-06-23 DIAGNOSIS — R279 Unspecified lack of coordination: Secondary | ICD-10-CM | POA: Diagnosis not present

## 2021-06-23 DIAGNOSIS — F0390 Unspecified dementia without behavioral disturbance: Secondary | ICD-10-CM | POA: Diagnosis not present

## 2021-06-23 DIAGNOSIS — M6281 Muscle weakness (generalized): Secondary | ICD-10-CM | POA: Diagnosis not present

## 2021-06-24 DIAGNOSIS — R279 Unspecified lack of coordination: Secondary | ICD-10-CM | POA: Diagnosis not present

## 2021-06-24 DIAGNOSIS — M6281 Muscle weakness (generalized): Secondary | ICD-10-CM | POA: Diagnosis not present

## 2021-06-24 DIAGNOSIS — Z9181 History of falling: Secondary | ICD-10-CM | POA: Diagnosis not present

## 2021-06-24 DIAGNOSIS — F0390 Unspecified dementia without behavioral disturbance: Secondary | ICD-10-CM | POA: Diagnosis not present

## 2021-06-24 DIAGNOSIS — M159 Polyosteoarthritis, unspecified: Secondary | ICD-10-CM | POA: Diagnosis not present

## 2021-06-25 DIAGNOSIS — Z9181 History of falling: Secondary | ICD-10-CM | POA: Diagnosis not present

## 2021-06-25 DIAGNOSIS — F0390 Unspecified dementia without behavioral disturbance: Secondary | ICD-10-CM | POA: Diagnosis not present

## 2021-06-25 DIAGNOSIS — M6281 Muscle weakness (generalized): Secondary | ICD-10-CM | POA: Diagnosis not present

## 2021-06-25 DIAGNOSIS — R279 Unspecified lack of coordination: Secondary | ICD-10-CM | POA: Diagnosis not present

## 2021-06-25 DIAGNOSIS — M159 Polyosteoarthritis, unspecified: Secondary | ICD-10-CM | POA: Diagnosis not present

## 2021-06-27 DIAGNOSIS — R279 Unspecified lack of coordination: Secondary | ICD-10-CM | POA: Diagnosis not present

## 2021-06-27 DIAGNOSIS — F0390 Unspecified dementia without behavioral disturbance: Secondary | ICD-10-CM | POA: Diagnosis not present

## 2021-06-27 DIAGNOSIS — Z9181 History of falling: Secondary | ICD-10-CM | POA: Diagnosis not present

## 2021-06-27 DIAGNOSIS — M159 Polyosteoarthritis, unspecified: Secondary | ICD-10-CM | POA: Diagnosis not present

## 2021-06-27 DIAGNOSIS — M6281 Muscle weakness (generalized): Secondary | ICD-10-CM | POA: Diagnosis not present

## 2021-06-29 DIAGNOSIS — M159 Polyosteoarthritis, unspecified: Secondary | ICD-10-CM | POA: Diagnosis not present

## 2021-06-29 DIAGNOSIS — R279 Unspecified lack of coordination: Secondary | ICD-10-CM | POA: Diagnosis not present

## 2021-06-29 DIAGNOSIS — Z9181 History of falling: Secondary | ICD-10-CM | POA: Diagnosis not present

## 2021-06-29 DIAGNOSIS — F0390 Unspecified dementia without behavioral disturbance: Secondary | ICD-10-CM | POA: Diagnosis not present

## 2021-06-29 DIAGNOSIS — M6281 Muscle weakness (generalized): Secondary | ICD-10-CM | POA: Diagnosis not present

## 2021-06-30 DIAGNOSIS — M159 Polyosteoarthritis, unspecified: Secondary | ICD-10-CM | POA: Diagnosis not present

## 2021-06-30 DIAGNOSIS — R279 Unspecified lack of coordination: Secondary | ICD-10-CM | POA: Diagnosis not present

## 2021-06-30 DIAGNOSIS — Z9181 History of falling: Secondary | ICD-10-CM | POA: Diagnosis not present

## 2021-06-30 DIAGNOSIS — F0390 Unspecified dementia without behavioral disturbance: Secondary | ICD-10-CM | POA: Diagnosis not present

## 2021-06-30 DIAGNOSIS — M6281 Muscle weakness (generalized): Secondary | ICD-10-CM | POA: Diagnosis not present

## 2021-07-01 DIAGNOSIS — M6281 Muscle weakness (generalized): Secondary | ICD-10-CM | POA: Diagnosis not present

## 2021-07-01 DIAGNOSIS — Z9181 History of falling: Secondary | ICD-10-CM | POA: Diagnosis not present

## 2021-07-01 DIAGNOSIS — M159 Polyosteoarthritis, unspecified: Secondary | ICD-10-CM | POA: Diagnosis not present

## 2021-07-01 DIAGNOSIS — R279 Unspecified lack of coordination: Secondary | ICD-10-CM | POA: Diagnosis not present

## 2021-07-01 DIAGNOSIS — F0390 Unspecified dementia without behavioral disturbance: Secondary | ICD-10-CM | POA: Diagnosis not present

## 2021-07-02 DIAGNOSIS — Z9181 History of falling: Secondary | ICD-10-CM | POA: Diagnosis not present

## 2021-07-02 DIAGNOSIS — R279 Unspecified lack of coordination: Secondary | ICD-10-CM | POA: Diagnosis not present

## 2021-07-02 DIAGNOSIS — F0390 Unspecified dementia without behavioral disturbance: Secondary | ICD-10-CM | POA: Diagnosis not present

## 2021-07-02 DIAGNOSIS — M6281 Muscle weakness (generalized): Secondary | ICD-10-CM | POA: Diagnosis not present

## 2021-07-02 DIAGNOSIS — M159 Polyosteoarthritis, unspecified: Secondary | ICD-10-CM | POA: Diagnosis not present

## 2021-07-03 ENCOUNTER — Non-Acute Institutional Stay (SKILLED_NURSING_FACILITY): Payer: Medicare PPO | Admitting: Adult Health

## 2021-07-03 ENCOUNTER — Encounter: Payer: Self-pay | Admitting: Adult Health

## 2021-07-03 DIAGNOSIS — R279 Unspecified lack of coordination: Secondary | ICD-10-CM | POA: Diagnosis not present

## 2021-07-03 DIAGNOSIS — E785 Hyperlipidemia, unspecified: Secondary | ICD-10-CM | POA: Diagnosis not present

## 2021-07-03 DIAGNOSIS — F324 Major depressive disorder, single episode, in partial remission: Secondary | ICD-10-CM

## 2021-07-03 DIAGNOSIS — F0390 Unspecified dementia without behavioral disturbance: Secondary | ICD-10-CM | POA: Diagnosis not present

## 2021-07-03 DIAGNOSIS — F5104 Psychophysiologic insomnia: Secondary | ICD-10-CM | POA: Diagnosis not present

## 2021-07-03 DIAGNOSIS — Z9181 History of falling: Secondary | ICD-10-CM | POA: Diagnosis not present

## 2021-07-03 DIAGNOSIS — M6281 Muscle weakness (generalized): Secondary | ICD-10-CM | POA: Diagnosis not present

## 2021-07-03 DIAGNOSIS — M159 Polyosteoarthritis, unspecified: Secondary | ICD-10-CM | POA: Diagnosis not present

## 2021-07-03 DIAGNOSIS — R7989 Other specified abnormal findings of blood chemistry: Secondary | ICD-10-CM

## 2021-07-03 NOTE — Progress Notes (Signed)
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 Psychophysiological insomnia:  Hyperlipidemia LDL goal <100. There are no reports of uncontrolled pain. There are no reports of changes in appetite; no reports of insomnia present.   Past Medical History:  Diagnosis Date   Abnormality of gait 06/06/2014   Allergic rhinitis    Anemia    Ankle fracture, right    Anxiety    Arm fracture, left    Arthritis    abnormal gait    COPD (chronic obstructive pulmonary disease) (HCC)    chronic CO2 retention, decreased DLCO    Cystic acne    adult    Degenerative disc disease, cervical    syrinx C3-7   Degenerative disc disease, lumbar    L5 nerve impingement    Dementia (HCC)    Depression    DNR (do not resuscitate)    Fracture of right proximal fibula 07/13/18 09/05/2018   GERD (gastroesophageal reflux disease)    Heme positive stool 10/10/2017   History of colon surgery    right hemicolectomy for high-grade adenomas in right colon 2013   Hyperglycemia    Hypertension    Low back pain    Mediastinal lymphadenopathy 1/9   resolving    Memory difficulty 09/20/2014   mild dementia   Onychomycosis    OSA on CPAP    Sleep apnea    stop bang score 6     Past Surgical History:  Procedure Laterality Date   ABDOMINAL HYSTERECTOMY  1976   secondary to bleeding    APPENDECTOMY     COLON RESECTION  08/18/2012   Procedure: HAND ASSISTED LAPAROSCOPIC COLON RESECTION;  Surgeon: Jamesetta So, MD;  Location: [facility];  Service: General;  Laterality: N/A;   COLONOSCOPY  07/20/2012   Dr. Gala Romney: multiple colonic polyps with large polyps on right side, s/p saline-assisted debulking piecemeal polypectomy and ablation. Not all removed. Path with tubulovillous adenomas, high grade dysplasia.    COLONOSCOPY N/A 03/09/2013   [id number] polyps-tubular adenomas. S/p right hemicolectomy. next tcs 02/2018   epidural steroids     ESOPHAGOGASTRODUODENOSCOPY (EGD) WITH ESOPHAGEAL DILATION N/A 02/06/2014   Procedure: ESOPHAGOGASTRODUODENOSCOPY (EGD) WITH ESOPHAGEAL DILATION;  Surgeon: Daneil Dolin, MD;  Location: [facility];  Service: Endoscopy;  Laterality: N/A;  9:30   OOPHORECTOMY  1976   ORIF ANKLE FRACTURE  01/18/2012   Procedure: OPEN REDUCTION INTERNAL FIXATION (ORIF) ANKLE FRACTURE;  Surgeon: Arther Abbott, MD;  Location: [facility];  Service: Orthopedics;  Laterality: Left;   ORIF ANKLE FRACTURE Right 01/13/2016   Procedure: OPEN REDUCTION INTERNAL FIXATION (ORIF) ANKLE FRACTURE;  Surgeon: Carole Civil, MD;  Location: [facility];  Service: Orthopedics;  Laterality:  Right;    Social History   Socioeconomic History   Marital status: Widowed    Spouse name: Not on file   Number of children: 4   Years of education: college 1   Highest education level: Not on file  Occupational History   Occupation: employed in the tobacco industry     Employer: RETIRED   Occupation: retired  Tobacco Use   Smoking status: Former    Packs/day: 1.50    Years: 40.00    Pack years: 60.00    Types: Cigarettes    Quit date: 06/21/2006    Years since quitting: 15.0   Smokeless tobacco: Never  Vaping Use   Vaping Use: Never used  Substance and Sexual Activity    Alcohol use: No   Drug use: No   Sexual activity: Not Currently  Other Topics Concern   Not on file  Social History Narrative   Long term resident of [facility]    Social Determinants of Health   Financial Resource Strain: Low Risk    Difficulty of Paying Living Expenses: Not hard at all  Food Insecurity: No Food Insecurity   Worried About Charity fundraiser in the Last Year: Never true   [place] in the Last Year: Never true  Transportation Needs: No Transportation Needs   Lack of Transportation (Medical): No   Lack of Transportation (Non-Medical): No  Physical Activity: Inactive   Days of Exercise per Week: 0 days   Minutes of Exercise per Session: 0 min  Stress: No Stress Concern Present   Feeling of Stress : Not at all  Social Connections: Socially Isolated   Frequency of Communication with Friends and Family: Three times a week   Frequency of Social Gatherings with Friends and Family: Once a week   Attends Religious Services: Never   Marine scientist or Organizations: No   Attends Archivist Meetings: Never   Marital Status: Widowed  Human resources officer Violence: Not At Risk   Fear of Current or Ex-Partner: No   Emotionally Abused: No   Physically Abused: No   Sexually Abused: No   Family History  Problem Relation Age of Onset   Stomach cancer Father    Cancer Father    Cancer Mother        brain    Breast cancer Mother    Arthritis Other       VITAL SIGNS BP 132/72   Pulse 90   Temp 98.3 F (36.8 C)   Resp 16   Ht '6\' 1"'$  (1.854 m)   Wt 175 lb 9.6 oz (79.7 kg)   SpO2 98%   BMI 23.17 kg/m   Outpatient Encounter Medications as of 07/03/2021  Medication Sig   acetaminophen (TYLENOL) 325 MG tablet Take 2 tabs ('650mg'$  total), oral, Every 6 Hours - PRN, As needed for pain. Max 3gm acetaminophen/24 hrs from all sources.   albuterol (VENTOLIN HFA) 108 (90 Base) MCG/ACT inhaler Inhale 2 puffs into the lungs every 6 (six) hours as needed for  wheezing or shortness of breath.   alendronate (FOSAMAX) 70 MG tablet Take 70 mg by mouth daily. Take on an empty stomach at least 30 mns prior to food. Sit upright x 48ms after taking meds.   Amino Acids-Protein Hydrolys (FEEDING SUPPLEMENT, PRO-STAT SUGAR FREE 64,) LIQD Take 30 mLs by mouth 3 (three) times daily with meals.   amLODipine (NORVASC) 2.5 MG tablet Take 1 tablet (2.5 mg total) by mouth  daily.   atorvastatin (LIPITOR) 10 MG tablet TAKE 1 TABLET EVERY DAY   CALCIUM CARBONATE-VITAMIN D PO Take by mouth. 600 mg-10 mcg, oral, Once A Day   Cholecalciferol (VITAMIN D3 PO) Take 25 mcg by mouth daily. Take 25 mcg (1,000 Units), oral, Once A Day   donepezil (ARICEPT) 10 MG tablet TAKE 1 TABLET AT BEDTIME   loperamide (IMODIUM A-D) 2 MG tablet Take 2 mg by mouth 4 (four) times daily as needed for diarrhea or loose stools.   loratadine (CLARITIN) 10 MG tablet Take 10 mg by mouth daily.   mirabegron ER (MYRBETRIQ) 25 MG TB24 tablet Take 25 mg by mouth daily.   mirtazapine (REMERON) 15 MG tablet TAKE 1 TABLET (15 MG TOTAL) BY MOUTH AT BEDTIME.   NON FORMULARY Diet-regular   omeprazole (PRILOSEC) 40 MG capsule TAKE 1 CAPSULE EVERY DAY   OXYGEN Inhale 2 L into the lungs continuous.   STRIVERDI RESPIMAT 2.5 MCG/ACT AERS INHALE 2 PUFFS ONE TIME DAILY AT THE SAME TIME   verapamil (CALAN-SR) 240 MG CR tablet TAKE 1 TABLET AT BEDTIME   hydrOXYzine (ATARAX/VISTARIL) 10 MG tablet Take one tablet by mouth at bedtime for sleep   [DISCONTINUED] alendronate (FOSAMAX) 70 MG tablet TAKE 1 TABLET ONE TIME A WEEK. TAKE WITH A FULL GLASS OF WATER ON AN EMPTY STOMACH. (Patient taking differently: Take on an empty stomach at least 30 mns prior to food. Sit upright x 41ms after taking meds.)   No facility-administered encounter medications on file as of 07/03/2021.     SIGNIFICANT DIAGNOSTIC EXAMS   LABS REVIEWED PREVIOUS  01-06-21: wbc 6.1; hgb 11.3; hct 37.1; mcv 88; plt 278; glucose 150; bun 24; creat  1.25; k+ 3.2; na++ 148; ca 8.9; GFR 47; liver normal albumin 3.7; chol 152; ldl 61; trig 125; hdl 69; vit D 63.3; tsh 0.407; free T4; 0.84 03-30-21: wbc 5.5; hgb 8.9; hct 30.2; mcv 92.9 plt 210; glucose 78; bun 23; creat 0.86; k+ 3.5; na++ 143; ca 8.6 GFR>60; liver normal albumin 2.9; chol 141; ldl 65; trig 67; hdl 63 06-02-21: urine culture: no growth; hep C neg  NO NEW LABS.   Review of Systems  Constitutional:  Negative for malaise/fatigue.  Respiratory:  Negative for cough and shortness of breath.   Cardiovascular:  Negative for chest pain, palpitations and leg swelling.  Gastrointestinal:  Negative for abdominal pain, constipation and heartburn.  Musculoskeletal:  Negative for back pain, joint pain and myalgias.  Skin: Negative.   Neurological:  Negative for dizziness.  Psychiatric/Behavioral:  The patient is not nervous/anxious.    Physical Exam Constitutional:      General: She is not in acute distress.    Appearance: She is well-developed. She is not diaphoretic.  Neck:     Thyroid: No thyromegaly.  Cardiovascular:     Rate and Rhythm: Normal rate and regular rhythm.     Pulses: Normal pulses.     Heart sounds: Murmur heard.     Comments: 2/6 Pulmonary:     Effort: Pulmonary effort is normal. No respiratory distress.     Breath sounds: Normal breath sounds.     Comments: 02 Abdominal:     General: Bowel sounds are normal. There is no distension.     Palpations: Abdomen is soft.     Tenderness: There is no abdominal tenderness.  Musculoskeletal:        General: Normal range of motion.     Cervical back: Neck supple.  Right lower leg: No edema.     Left lower leg: No edema.  Lymphadenopathy:     Cervical: No cervical adenopathy.  Skin:    General: Skin is warm and dry.  Neurological:     Mental Status: She is alert. Mental status is at baseline.  Psychiatric:        Mood and Affect: Mood normal.       ASSESSMENT AND PLAN   TODAY   Major depressive  disorder with single episode partial remission: is stable is presently off prozac  2. Low thyroid stimulating hormone (TSH) is stable tsh 0.407 free t4: 0.84 will monitor   3. Psychophysiological insomnia: is stable will continue atarax 10 mg nightly is off remeron  4. Hyperlipidemia LDL goal <100: ldl 65: will continue lipitor 10 mg daily   PREVIOUS   5. OAB: is stable will continue myrbetriq 25 mg nightly   6. Protein calorie malnutrition severe albumin 2.9; will continue prostat and supplements.   7. COPD with hypoxia: is stable is 02 dependent: will continue striverdi respimat 2.5 mcg 2 puffs daily; claritin 10 mg daily as needed; albuterol 2 puffs every 6 hours as needed   8. Essential hypertension: is stable b/p 132/72 will continue norvasc 2.5 mg daily calan sr 240 mg daily   9. Esophageal reflux disease without esophagitis: is stable will continue prilosec 40 mg daily   10. Vascular dementia without behavioral disturbance: is stable weight 175 pounds; will continue aricept 10 mg nightly  11. Generalized osteoarthritis of multiple sites: is stable has tylenol 650 mg every 8 hours as needed  12. Age related osteoporosis without current pathological fracture: is stable will continue fosamax 70 mg weekly   13. Stage 3a chronic kidney disease: is stable bun 23; creat 0.83; GFR >60  14. Normocytic anemia: is stable hgb 8.9 will monitor        Gabriella Edwards NP [facility]  Contact [phone] Monday through Friday 8am- 5pm  After hours call [phone]

## 2021-07-06 DIAGNOSIS — Z9181 History of falling: Secondary | ICD-10-CM | POA: Diagnosis not present

## 2021-07-06 DIAGNOSIS — F0390 Unspecified dementia without behavioral disturbance: Secondary | ICD-10-CM | POA: Diagnosis not present

## 2021-07-06 DIAGNOSIS — M6281 Muscle weakness (generalized): Secondary | ICD-10-CM | POA: Diagnosis not present

## 2021-07-06 DIAGNOSIS — M159 Polyosteoarthritis, unspecified: Secondary | ICD-10-CM | POA: Diagnosis not present

## 2021-07-06 DIAGNOSIS — R279 Unspecified lack of coordination: Secondary | ICD-10-CM | POA: Diagnosis not present

## 2021-07-07 DIAGNOSIS — M6281 Muscle weakness (generalized): Secondary | ICD-10-CM | POA: Diagnosis not present

## 2021-07-07 DIAGNOSIS — Z9181 History of falling: Secondary | ICD-10-CM | POA: Diagnosis not present

## 2021-07-07 DIAGNOSIS — R279 Unspecified lack of coordination: Secondary | ICD-10-CM | POA: Diagnosis not present

## 2021-07-07 DIAGNOSIS — F0390 Unspecified dementia without behavioral disturbance: Secondary | ICD-10-CM | POA: Diagnosis not present

## 2021-07-07 DIAGNOSIS — M159 Polyosteoarthritis, unspecified: Secondary | ICD-10-CM | POA: Diagnosis not present

## 2021-07-08 DIAGNOSIS — M159 Polyosteoarthritis, unspecified: Secondary | ICD-10-CM | POA: Diagnosis not present

## 2021-07-08 DIAGNOSIS — Z9181 History of falling: Secondary | ICD-10-CM | POA: Diagnosis not present

## 2021-07-08 DIAGNOSIS — F0390 Unspecified dementia without behavioral disturbance: Secondary | ICD-10-CM | POA: Diagnosis not present

## 2021-07-08 DIAGNOSIS — R279 Unspecified lack of coordination: Secondary | ICD-10-CM | POA: Diagnosis not present

## 2021-07-08 DIAGNOSIS — M6281 Muscle weakness (generalized): Secondary | ICD-10-CM | POA: Diagnosis not present

## 2021-07-09 DIAGNOSIS — Z9181 History of falling: Secondary | ICD-10-CM | POA: Diagnosis not present

## 2021-07-09 DIAGNOSIS — M6281 Muscle weakness (generalized): Secondary | ICD-10-CM | POA: Diagnosis not present

## 2021-07-09 DIAGNOSIS — R279 Unspecified lack of coordination: Secondary | ICD-10-CM | POA: Diagnosis not present

## 2021-07-09 DIAGNOSIS — F0390 Unspecified dementia without behavioral disturbance: Secondary | ICD-10-CM | POA: Diagnosis not present

## 2021-07-09 DIAGNOSIS — M159 Polyosteoarthritis, unspecified: Secondary | ICD-10-CM | POA: Diagnosis not present

## 2021-07-10 DIAGNOSIS — M6281 Muscle weakness (generalized): Secondary | ICD-10-CM | POA: Diagnosis not present

## 2021-07-10 DIAGNOSIS — F0390 Unspecified dementia without behavioral disturbance: Secondary | ICD-10-CM | POA: Diagnosis not present

## 2021-07-10 DIAGNOSIS — R279 Unspecified lack of coordination: Secondary | ICD-10-CM | POA: Diagnosis not present

## 2021-07-10 DIAGNOSIS — M159 Polyosteoarthritis, unspecified: Secondary | ICD-10-CM | POA: Diagnosis not present

## 2021-07-10 DIAGNOSIS — Z9181 History of falling: Secondary | ICD-10-CM | POA: Diagnosis not present

## 2021-07-13 DIAGNOSIS — M6281 Muscle weakness (generalized): Secondary | ICD-10-CM | POA: Diagnosis not present

## 2021-07-13 DIAGNOSIS — R279 Unspecified lack of coordination: Secondary | ICD-10-CM | POA: Diagnosis not present

## 2021-07-13 DIAGNOSIS — F0390 Unspecified dementia without behavioral disturbance: Secondary | ICD-10-CM | POA: Diagnosis not present

## 2021-07-13 DIAGNOSIS — Z9181 History of falling: Secondary | ICD-10-CM | POA: Diagnosis not present

## 2021-07-13 DIAGNOSIS — M159 Polyosteoarthritis, unspecified: Secondary | ICD-10-CM | POA: Diagnosis not present

## 2021-07-14 DIAGNOSIS — F0390 Unspecified dementia without behavioral disturbance: Secondary | ICD-10-CM | POA: Diagnosis not present

## 2021-07-14 DIAGNOSIS — M159 Polyosteoarthritis, unspecified: Secondary | ICD-10-CM | POA: Diagnosis not present

## 2021-07-14 DIAGNOSIS — M6281 Muscle weakness (generalized): Secondary | ICD-10-CM | POA: Diagnosis not present

## 2021-07-14 DIAGNOSIS — Z9181 History of falling: Secondary | ICD-10-CM | POA: Diagnosis not present

## 2021-07-14 DIAGNOSIS — R279 Unspecified lack of coordination: Secondary | ICD-10-CM | POA: Diagnosis not present

## 2021-07-15 DIAGNOSIS — R279 Unspecified lack of coordination: Secondary | ICD-10-CM | POA: Diagnosis not present

## 2021-07-15 DIAGNOSIS — M6281 Muscle weakness (generalized): Secondary | ICD-10-CM | POA: Diagnosis not present

## 2021-07-15 DIAGNOSIS — F0390 Unspecified dementia without behavioral disturbance: Secondary | ICD-10-CM | POA: Diagnosis not present

## 2021-07-15 DIAGNOSIS — Z9181 History of falling: Secondary | ICD-10-CM | POA: Diagnosis not present

## 2021-07-15 DIAGNOSIS — M159 Polyosteoarthritis, unspecified: Secondary | ICD-10-CM | POA: Diagnosis not present

## 2021-07-16 ENCOUNTER — Non-Acute Institutional Stay (SKILLED_NURSING_FACILITY): Payer: Medicare PPO | Admitting: Adult Health

## 2021-07-16 ENCOUNTER — Encounter: Payer: Self-pay | Admitting: Adult Health

## 2021-07-16 DIAGNOSIS — N1831 Chronic kidney disease, stage 3a: Secondary | ICD-10-CM | POA: Diagnosis not present

## 2021-07-16 DIAGNOSIS — R0902 Hypoxemia: Secondary | ICD-10-CM

## 2021-07-16 DIAGNOSIS — Z9181 History of falling: Secondary | ICD-10-CM | POA: Diagnosis not present

## 2021-07-16 DIAGNOSIS — M6281 Muscle weakness (generalized): Secondary | ICD-10-CM | POA: Diagnosis not present

## 2021-07-16 DIAGNOSIS — F0150 Vascular dementia without behavioral disturbance: Secondary | ICD-10-CM

## 2021-07-16 DIAGNOSIS — E43 Unspecified severe protein-calorie malnutrition: Secondary | ICD-10-CM | POA: Diagnosis not present

## 2021-07-16 DIAGNOSIS — F0390 Unspecified dementia without behavioral disturbance: Secondary | ICD-10-CM | POA: Diagnosis not present

## 2021-07-16 DIAGNOSIS — J449 Chronic obstructive pulmonary disease, unspecified: Secondary | ICD-10-CM | POA: Diagnosis not present

## 2021-07-16 DIAGNOSIS — R279 Unspecified lack of coordination: Secondary | ICD-10-CM | POA: Diagnosis not present

## 2021-07-16 DIAGNOSIS — M159 Polyosteoarthritis, unspecified: Secondary | ICD-10-CM | POA: Diagnosis not present

## 2021-07-16 NOTE — Progress Notes (Signed)
Location:  [facility] Room Number: 149-W Place of Service:  SNF (31)   CODE STATUS: DNR  Allergies  Allergen Reactions   Fenofibrate Nausea And Vomiting   Pravastatin Sodium Nausea And Vomiting   Sulfonamide Derivatives Other (See Comments)    unknown   Ciprofloxacin Rash    Chief Complaint  Patient presents with   Acute Visit    Care plan meeting    HPI:  We have come together for her care plan meeting.  . No BIMS: mood 0/30. She is nonambulatory; propels self in wheelchair. She requires limited to extensive assist with her adls. She is incontinent of bladder and bowel. She is able to feed herself. Dietary : weight is 175.6 pounds weight stable; regular diet good appetite; on protein supplements for low albumin level. She has had many  falls with some minor injury. Therapy none at this time .   She will continue to be followed for her chronic illnesses including: COPD with hypoxia Vascular dementia without behavioral disturbance  Stage 3a chronic kidney disease  Protein calorie malnutrition severe  Past Medical History:  Diagnosis Date   Abnormality of gait 06/06/2014   Allergic rhinitis    Anemia    Ankle fracture, right    Anxiety    Arm fracture, left    Arthritis    abnormal gait    COPD (chronic obstructive pulmonary disease) (HCC)    chronic CO2 retention, decreased DLCO    Cystic acne    adult    Degenerative disc disease, cervical    syrinx C3-7   Degenerative disc disease, lumbar    L5 nerve impingement    Dementia (HCC)    Depression    DNR (do not resuscitate)    Fracture of right proximal fibula 07/13/18 09/05/2018   GERD (gastroesophageal reflux disease)    Heme positive stool 10/10/2017   History of colon surgery    right hemicolectomy for high-grade adenomas in right colon 2013   Hyperglycemia    Hypertension    Low back pain    Mediastinal lymphadenopathy 1/9   resolving    Memory difficulty 09/20/2014   mild dementia    Onychomycosis    OSA on CPAP    Sleep apnea    stop bang score 6    Past Surgical History:  Procedure Laterality Date   ABDOMINAL HYSTERECTOMY  1976   secondary to bleeding    APPENDECTOMY     COLON RESECTION  08/18/2012   Procedure: HAND ASSISTED LAPAROSCOPIC COLON RESECTION;  Surgeon: Jamesetta So, MD;  Location: [facility];  Service: General;  Laterality: N/A;   COLONOSCOPY  07/20/2012   Dr. Gala Romney: multiple colonic polyps with large polyps on right side, s/p saline-assisted debulking piecemeal polypectomy and ablation. Not all removed. Path with tubulovillous adenomas, high grade dysplasia.    COLONOSCOPY N/A 03/09/2013   [id number] polyps-tubular adenomas. S/p right hemicolectomy. next tcs 02/2018   epidural steroids     ESOPHAGOGASTRODUODENOSCOPY (EGD) WITH ESOPHAGEAL DILATION N/A 02/06/2014   Procedure: ESOPHAGOGASTRODUODENOSCOPY (EGD) WITH ESOPHAGEAL DILATION;  Surgeon: Daneil Dolin, MD;  [facility];  Service: Endoscopy;  Laterality: N/A;  9:30   OOPHORECTOMY  1976   ORIF ANKLE FRACTURE  01/18/2012   Procedure: OPEN REDUCTION INTERNAL FIXATION (ORIF) ANKLE FRACTURE;  Surgeon: Arther Abbott, MD;  Location: [facility];  Service: Orthopedics;  Laterality: Left;   ORIF ANKLE FRACTURE Right 01/13/2016   Procedure: OPEN REDUCTION INTERNAL FIXATION (ORIF) ANKLE  FRACTURE;  Surgeon: Carole Civil, MD;  Location: [facility];  Service: Orthopedics;  Laterality: Right;    Social History   Socioeconomic History   Marital status: Widowed    Spouse name: Not on file   Number of children: 4   Years of education: college 1   Highest education level: Not on file  Occupational History   Occupation: employed in the tobacco industry     Employer: RETIRED   Occupation: retired  Tobacco Use   Smoking status: Former    Packs/day: 1.50    Years: 40.00    Pack years: 60.00    Types: Cigarettes    Quit date: 06/21/2006    Years since quitting: 15.0   Smokeless tobacco: Never  Vaping  Use   Vaping Use: Never used  Substance and Sexual Activity   Alcohol use: No   Drug use: No   Sexual activity: Not Currently  Other Topics Concern   Not on file  Social History Narrative   Long term resident of [facility]    Social Determinants of Health   Financial Resource Strain: Low Risk    Difficulty of Paying Living Expenses: Not hard at all  Food Insecurity: No Food Insecurity   Worried About Charity fundraiser in the Last Year: Never true   [place] in the Last Year: Never true  Transportation Needs: No Transportation Needs   Lack of Transportation (Medical): No   Lack of Transportation (Non-Medical): No  Physical Activity: Inactive   Days of Exercise per Week: 0 days   Minutes of Exercise per Session: 0 min  Stress: No Stress Concern Present   Feeling of Stress : Not at all  Social Connections: Socially Isolated   Frequency of Communication with Friends and Family: Three times a week   Frequency of Social Gatherings with Friends and Family: Once a week   Attends Religious Services: Never   Marine scientist or Organizations: No   Attends Archivist Meetings: Never   Marital Status: Widowed  Human resources officer Violence: Not At Risk   Fear of Current or Ex-Partner: No   Emotionally Abused: No   Physically Abused: No   Sexually Abused: No   Family History  Problem Relation Age of Onset   Stomach cancer Father    Cancer Father    Cancer Mother        brain    Breast cancer Mother    Arthritis Other       VITAL SIGNS BP 122/75   Pulse 87   Temp 98.1 F (36.7 C)   Resp 18   Ht '6\' 1"'$  (1.854 m)   Wt 175 lb 9.6 oz (79.7 kg)   SpO2 99%   BMI 23.17 kg/m   Outpatient Encounter Medications as of 07/16/2021  Medication Sig   acetaminophen (TYLENOL) 325 MG tablet Take 2 tabs ('650mg'$  total), oral, Every 6 Hours - PRN, As needed for pain. Max 3gm acetaminophen/24 hrs from all sources.   albuterol (VENTOLIN HFA) 108 (90 Base) MCG/ACT inhaler  Inhale 2 puffs into the lungs every 6 (six) hours as needed for wheezing or shortness of breath.   alendronate (FOSAMAX) 70 MG tablet Take 70 mg by mouth daily. Take on an empty stomach at least 30 mns prior to food. Sit upright x 21ms after taking meds.   Amino Acids-Protein Hydrolys (FEEDING SUPPLEMENT, PRO-STAT SUGAR FREE 64,) LIQD Take 30 mLs by mouth 3 (three) times daily with  meals.   amLODipine (NORVASC) 2.5 MG tablet Take 1 tablet (2.5 mg total) by mouth daily.   atorvastatin (LIPITOR) 10 MG tablet TAKE 1 TABLET EVERY DAY   CALCIUM CARBONATE-VITAMIN D PO Take by mouth. 600 mg-10 mcg, oral, Once A Day   Cholecalciferol (VITAMIN D3 PO) Take 25 mcg by mouth daily. Take 25 mcg (1,000 Units), oral, Once A Day   donepezil (ARICEPT) 10 MG tablet TAKE 1 TABLET AT BEDTIME   loperamide (IMODIUM A-D) 2 MG tablet Take 2 mg by mouth 4 (four) times daily as needed for diarrhea or loose stools.   loratadine (CLARITIN) 10 MG tablet Take 10 mg by mouth daily.   mirabegron ER (MYRBETRIQ) 25 MG TB24 tablet Take 25 mg by mouth daily.   mirtazapine (REMERON) 15 MG tablet TAKE 1 TABLET (15 MG TOTAL) BY MOUTH AT BEDTIME.   NON FORMULARY Diet-regular   omeprazole (PRILOSEC) 40 MG capsule TAKE 1 CAPSULE EVERY DAY   OXYGEN Inhale 2 L into the lungs continuous.   STRIVERDI RESPIMAT 2.5 MCG/ACT AERS INHALE 2 PUFFS ONE TIME DAILY AT THE SAME TIME   verapamil (CALAN-SR) 240 MG CR tablet TAKE 1 TABLET AT BEDTIME   hydrOXYzine (ATARAX/VISTARIL) 10 MG tablet Take one tablet by mouth at bedtime for sleep   No facility-administered encounter medications on file as of 07/16/2021.     SIGNIFICANT DIAGNOSTIC EXAMS   LABS REVIEWED PREVIOUS  01-06-21: wbc 6.1; hgb 11.3; hct 37.1; mcv 88; plt 278; glucose 150; bun 24; creat 1.25; k+ 3.2; na++ 148; ca 8.9; GFR 47; liver normal albumin 3.7; chol 152; ldl 61; trig 125; hdl 69; vit D 63.3; tsh 0.407; free T4; 0.84 03-30-21: wbc 5.5; hgb 8.9; hct 30.2; mcv 92.9 plt 210; glucose  78; bun 23; creat 0.86; k+ 3.5; na++ 143; ca 8.6 GFR>60; liver normal albumin 2.9; chol 141; ldl 65; trig 67; hdl 63 06-02-21: urine culture: no growth; hep C neg  NO NEW LABS.  Review of Systems  Constitutional:  Negative for malaise/fatigue.  Respiratory:  Negative for cough and shortness of breath.   Cardiovascular:  Negative for chest pain, palpitations and leg swelling.  Gastrointestinal:  Negative for abdominal pain, constipation and heartburn.  Musculoskeletal:  Negative for back pain, joint pain and myalgias.  Skin: Negative.   Neurological:  Negative for dizziness.  Psychiatric/Behavioral:  The patient is not nervous/anxious.    Physical Exam Constitutional:      General: She is not in acute distress.    Appearance: She is well-developed. She is not diaphoretic.  Neck:     Thyroid: No thyromegaly.  Cardiovascular:     Rate and Rhythm: Normal rate and regular rhythm.     Pulses: Normal pulses.     Heart sounds: Murmur heard.     Comments: /6 Pulmonary:     Effort: Pulmonary effort is normal. No respiratory distress.     Breath sounds: Normal breath sounds.     Comments: 02 Abdominal:     General: Bowel sounds are normal. There is no distension.     Palpations: Abdomen is soft.     Tenderness: There is no abdominal tenderness.  Musculoskeletal:        General: Normal range of motion.     Cervical back: Neck supple.     Right lower leg: No edema.     Left lower leg: No edema.  Lymphadenopathy:     Cervical: No cervical adenopathy.  Skin:    General: Skin is  warm and dry.  Neurological:     Mental Status: She is alert. Mental status is at baseline.  Psychiatric:        Mood and Affect: Mood normal.     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    Time spent with patient: 40 minutes: goals of care; care plan; medications.    Ok Edwards NP [facility]  Contact [phone] Monday through Friday 8am- 5pm  After hours call [phone]

## 2021-07-17 DIAGNOSIS — Z9181 History of falling: Secondary | ICD-10-CM | POA: Diagnosis not present

## 2021-07-17 DIAGNOSIS — M6281 Muscle weakness (generalized): Secondary | ICD-10-CM | POA: Diagnosis not present

## 2021-07-17 DIAGNOSIS — R279 Unspecified lack of coordination: Secondary | ICD-10-CM | POA: Diagnosis not present

## 2021-07-17 DIAGNOSIS — F0390 Unspecified dementia without behavioral disturbance: Secondary | ICD-10-CM | POA: Diagnosis not present

## 2021-07-17 DIAGNOSIS — M159 Polyosteoarthritis, unspecified: Secondary | ICD-10-CM | POA: Diagnosis not present

## 2021-07-21 ENCOUNTER — Other Ambulatory Visit (HOSPITAL_COMMUNITY)
Admission: RE | Admit: 2021-07-21 | Discharge: 2021-07-21 | Disposition: A | Discharge status: Home / Self Care | Payer: Medicare PPO | Source: Skilled Nursing Facility | Attending: Adult Health | Admitting: Adult Health

## 2021-07-21 ENCOUNTER — Non-Acute Institutional Stay (SKILLED_NURSING_FACILITY): Payer: Medicare PPO | Admitting: Adult Health

## 2021-07-21 ENCOUNTER — Encounter: Payer: Self-pay | Admitting: Adult Health

## 2021-07-21 DIAGNOSIS — J449 Chronic obstructive pulmonary disease, unspecified: Secondary | ICD-10-CM

## 2021-07-21 DIAGNOSIS — E43 Unspecified severe protein-calorie malnutrition: Secondary | ICD-10-CM | POA: Diagnosis not present

## 2021-07-21 DIAGNOSIS — R0902 Hypoxemia: Secondary | ICD-10-CM | POA: Diagnosis not present

## 2021-07-21 DIAGNOSIS — E876 Hypokalemia: Secondary | ICD-10-CM | POA: Diagnosis not present

## 2021-07-21 DIAGNOSIS — R062 Wheezing: Secondary | ICD-10-CM | POA: Diagnosis not present

## 2021-07-21 DIAGNOSIS — J189 Pneumonia, unspecified organism: Secondary | ICD-10-CM | POA: Diagnosis not present

## 2021-07-21 LAB — CBC WITH DIFFERENTIAL/PLATELET
Abs Immature Granulocytes: 0.02 10*3/uL (ref 0.00–0.07)
Basophils Absolute: 0 10*3/uL (ref 0.0–0.1)
Basophils Relative: 1 %
Eosinophils Absolute: 0.3 10*3/uL (ref 0.0–0.5)
Eosinophils Relative: 6 %
HCT: 34.4 % — ABNORMAL LOW (ref 36.0–46.0)
Hemoglobin: 10 g/dL — ABNORMAL LOW (ref 12.0–15.0)
Immature Granulocytes: 0 %
Lymphocytes Relative: 20 %
Lymphs Abs: 0.9 10*3/uL (ref 0.7–4.0)
MCH: 25.4 pg — ABNORMAL LOW (ref 26.0–34.0)
MCHC: 29.1 g/dL — ABNORMAL LOW (ref 30.0–36.0)
MCV: 87.3 fL (ref 80.0–100.0)
Monocytes Absolute: 0.3 10*3/uL (ref 0.1–1.0)
Monocytes Relative: 7 %
Neutro Abs: 3.2 10*3/uL (ref 1.7–7.7)
Neutrophils Relative %: 66 %
Platelets: 224 10*3/uL (ref 150–400)
RBC: 3.94 MIL/uL (ref 3.87–5.11)
RDW: 16.1 % — ABNORMAL HIGH (ref 11.5–15.5)
WBC: 4.8 10*3/uL (ref 4.0–10.5)
nRBC: 0 % (ref 0.0–0.2)

## 2021-07-21 LAB — BASIC METABOLIC PANEL
Anion gap: 4 — ABNORMAL LOW (ref 5–15)
BUN: 22 mg/dL (ref 8–23)
CO2: 30 mmol/L (ref 22–32)
Calcium: 8.5 mg/dL — ABNORMAL LOW (ref 8.9–10.3)
Chloride: 106 mmol/L (ref 98–111)
Creatinine, Ser: 0.87 mg/dL (ref 0.44–1.00)
GFR, Estimated: >60 mL/min (ref >60)
Glucose, Bld: 138 mg/dL — ABNORMAL HIGH (ref 70–99)
Potassium: 3.4 mmol/L — ABNORMAL LOW (ref 3.5–5.1)
Sodium: 140 mmol/L (ref 135–145)

## 2021-07-21 NOTE — Progress Notes (Signed)
Location:  [place] Room Number: 149-W Place of Service:  SNF (31)   CODE STATUS: DNR  Allergies  Allergen Reactions   Fenofibrate Nausea And Vomiting   Pravastatin Sodium Nausea And Vomiting   Sulfonamide Derivatives Other (See Comments)    unknown   Ciprofloxacin Rash    Chief Complaint  Patient presents with   Acute Visit    Cough and congestion     HPI:  Staff report that she is having a cough with congestion for the past several days. There are no reports of fevers present. She denies any cough or shortness of breath. Her k+ is low at 3.4. she does have a history of COPD.   Past Medical History:  Diagnosis Date   Abnormality of gait 06/06/2014   Allergic rhinitis    Anemia    Ankle fracture, right    Anxiety    Arm fracture, left    Arthritis    abnormal gait    COPD (chronic obstructive pulmonary disease) (HCC)    chronic CO2 retention, decreased DLCO    Cystic acne    adult    Degenerative disc disease, cervical    syrinx C3-7   Degenerative disc disease, lumbar    L5 nerve impingement    Dementia (HCC)    Depression    DNR (do not resuscitate)    Fracture of right proximal fibula 07/13/18 09/05/2018   GERD (gastroesophageal reflux disease)    Heme positive stool 10/10/2017   History of colon surgery    right hemicolectomy for high-grade adenomas in right colon 2013   Hyperglycemia    Hypertension    Low back pain    Mediastinal lymphadenopathy 1/9   resolving    Memory difficulty 09/20/2014   mild dementia   Onychomycosis    OSA on CPAP    Sleep apnea    stop bang score 6    Past Surgical History:  Procedure Laterality Date   ABDOMINAL HYSTERECTOMY  1976   secondary to bleeding    APPENDECTOMY     COLON RESECTION  08/18/2012   Procedure: HAND ASSISTED LAPAROSCOPIC COLON RESECTION;  Surgeon: Jamesetta So, MD;  Location: [facility];  Service: General;  Laterality: N/A;   COLONOSCOPY  07/20/2012   Dr. Gala Romney: multiple  colonic polyps with large polyps on right side, s/p saline-assisted debulking piecemeal polypectomy and ablation. Not all removed. Path with tubulovillous adenomas, high grade dysplasia.    COLONOSCOPY N/A 03/09/2013   [id number] polyps-tubular adenomas. S/p right hemicolectomy. next tcs 02/2018   epidural steroids     ESOPHAGOGASTRODUODENOSCOPY (EGD) WITH ESOPHAGEAL DILATION N/A 02/06/2014   Procedure: ESOPHAGOGASTRODUODENOSCOPY (EGD) WITH ESOPHAGEAL DILATION;  Surgeon: Daneil Dolin, MD;  Location: [facility];  Service: Endoscopy;  Laterality: N/A;  9:30   OOPHORECTOMY  1976   ORIF ANKLE FRACTURE  01/18/2012   Procedure: OPEN REDUCTION INTERNAL FIXATION (ORIF) ANKLE FRACTURE;  Surgeon: Arther Abbott, MD;  Location: [facility];  Service: Orthopedics;  Laterality: Left;   ORIF ANKLE FRACTURE Right 01/13/2016   Procedure: OPEN REDUCTION INTERNAL FIXATION (ORIF) ANKLE FRACTURE;  Surgeon: Carole Civil, MD;  Location: [facility];  Service: Orthopedics;  Laterality: Right;    Social History   Socioeconomic History   Marital status: Widowed    Spouse name: Not on file   Number of children: 4   Years of education: college 1   Highest education level: Not on file  Occupational History   Occupation:  employed in the tobacco industry     Employer: RETIRED   Occupation: retired  Tobacco Use   Smoking status: Former    Packs/day: 1.50    Years: 40.00    Pack years: 60.00    Types: Cigarettes    Quit date: 06/21/2006    Years since quitting: 15.0   Smokeless tobacco: Never  Vaping Use   Vaping Use: Never used  Substance and Sexual Activity   Alcohol use: No   Drug use: No   Sexual activity: Not Currently  Other Topics Concern   Not on file  Social History Narrative   Long term resident of [facility]    Social Determinants of Health   Financial Resource Strain: Low Risk    Difficulty of Paying Living Expenses: Not hard at all  Food Insecurity: No Food Insecurity   Worried About Ship broker in the Last Year: Never true   [place] in the Last Year: Never true  Transportation Needs: No Transportation Needs   Lack of Transportation (Medical): No   Lack of Transportation (Non-Medical): No  Physical Activity: Inactive   Days of Exercise per Week: 0 days   Minutes of Exercise per Session: 0 min  Stress: No Stress Concern Present   Feeling of Stress : Not at all  Social Connections: Socially Isolated   Frequency of Communication with Friends and Family: Three times a week   Frequency of Social Gatherings with Friends and Family: Once a week   Attends Religious Services: Never   Marine scientist or Organizations: No   Attends Archivist Meetings: Never   Marital Status: Widowed  Human resources officer Violence: Not At Risk   Fear of Current or Ex-Partner: No   Emotionally Abused: No   Physically Abused: No   Sexually Abused: No   Family History  Problem Relation Age of Onset   Stomach cancer Father    Cancer Father    Cancer Mother        brain    Breast cancer Mother    Arthritis Other       VITAL SIGNS BP 130/74   Pulse 64   Temp 98.1 F (36.7 C)   Resp 18   Ht '6\' 1"'$  (1.854 m)   Wt 175 lb 3.2 oz (79.5 kg)   SpO2 97%   BMI 23.11 kg/m   Outpatient Encounter Medications as of 07/21/2021  Medication Sig   acetaminophen (TYLENOL) 325 MG tablet Take 650 mg by mouth every 6 (six) hours.   albuterol (VENTOLIN HFA) 108 (90 Base) MCG/ACT inhaler Inhale 2 puffs into the lungs every 6 (six) hours as needed for wheezing or shortness of breath.   alendronate (FOSAMAX) 70 MG tablet Take 70 mg by mouth daily. Take on an empty stomach at least 30 mns prior to food. Sit upright x 81ms after taking meds.   Amino Acids-Protein Hydrolys (FEEDING SUPPLEMENT, PRO-STAT SUGAR FREE 64,) LIQD Take 30 mLs by mouth 3 (three) times daily with meals.   amLODipine (NORVASC) 2.5 MG tablet Take 1 tablet (2.5 mg total) by mouth daily.   atorvastatin (LIPITOR) 10 MG  tablet TAKE 1 TABLET EVERY DAY   CALCIUM CARBONATE-VITAMIN D PO Take by mouth. 600 mg-10 mcg, oral, Once A Day   Cholecalciferol (VITAMIN D3 PO) Take 1,000 Units by mouth daily.   donepezil (ARICEPT) 10 MG tablet TAKE 1 TABLET AT BEDTIME   loperamide (IMODIUM A-D) 2 MG tablet Take 2  mg by mouth as needed for diarrhea or loose stools.   loratadine (CLARITIN) 10 MG tablet Take 10 mg by mouth daily.   mirabegron ER (MYRBETRIQ) 25 MG TB24 tablet Take 25 mg by mouth daily.   mirtazapine (REMERON) 15 MG tablet TAKE 1 TABLET (15 MG TOTAL) BY MOUTH AT BEDTIME.   NON FORMULARY Diet-regular   omeprazole (PRILOSEC) 40 MG capsule TAKE 1 CAPSULE EVERY DAY   OXYGEN Inhale 2 L into the lungs continuous.   STRIVERDI RESPIMAT 2.5 MCG/ACT AERS INHALE 2 PUFFS ONE TIME DAILY AT THE SAME TIME   verapamil (CALAN-SR) 240 MG CR tablet TAKE 1 TABLET AT BEDTIME   [DISCONTINUED] hydrOXYzine (ATARAX/VISTARIL) 10 MG tablet Take one tablet by mouth at bedtime for sleep   No facility-administered encounter medications on file as of 07/21/2021.     SIGNIFICANT DIAGNOSTIC EXAMS  LABS REVIEWED PREVIOUS  01-06-21: wbc 6.1; hgb 11.3; hct 37.1; mcv 88; plt 278; glucose 150; bun 24; creat 1.25; k+ 3.2; na++ 148; ca 8.9; GFR 47; liver normal albumin 3.7; chol 152; ldl 61; trig 125; hdl 69; vit D 63.3; tsh 0.407; free T4; 0.84 03-30-21: wbc 5.5; hgb 8.9; hct 30.2; mcv 92.9 plt 210; glucose 78; bun 23; creat 0.86; k+ 3.5; na++ 143; ca 8.6 GFR>60; liver normal albumin 2.9; chol 141; ldl 65; trig 67; hdl 63 06-02-21: urine culture: no growth; hep C neg  TODAY  07-21-21: wbc 4.8; hgb 10.0; hct 34.4; mcv 873 plt 224; glucose 138; bun 22; creat 0.87; k+ 3.4; na++ 140; ca 8.5 GFR>60  Review of Systems  Constitutional:  Negative for malaise/fatigue.  Respiratory:  Negative for cough and shortness of breath.   Cardiovascular:  Negative for chest pain, palpitations and leg swelling.  Gastrointestinal:  Negative for abdominal pain,  constipation and heartburn.  Musculoskeletal:  Negative for back pain, joint pain and myalgias.  Skin: Negative.   Neurological:  Negative for dizziness.  Psychiatric/Behavioral:  The patient is not nervous/anxious.    Physical Exam Constitutional:      General: She is not in acute distress.    Appearance: She is well-developed. She is not diaphoretic.  Neck:     Thyroid: No thyromegaly.  Cardiovascular:     Rate and Rhythm: Normal rate and regular rhythm.     Pulses: Normal pulses.     Heart sounds: Normal heart sounds.     Comments: 2/6 Pulmonary:     Effort: Pulmonary effort is normal. No respiratory distress.     Breath sounds: Normal breath sounds.     Comments: 02 Abdominal:     General: Bowel sounds are normal. There is no distension.     Palpations: Abdomen is soft.     Tenderness: There is no abdominal tenderness.  Musculoskeletal:        General: Normal range of motion.     Cervical back: Neck supple.     Right lower leg: No edema.     Left lower leg: No edema.  Lymphadenopathy:     Cervical: No cervical adenopathy.  Skin:    General: Skin is warm and dry.  Neurological:     Mental Status: She is alert. Mental status is at baseline.  Psychiatric:        Mood and Affect: Mood normal.      ASSESSMENT/ PLAN:  TODAY  COPD with hypoxia Severe protein calorie malnutrition Hypokalemia  Will get chest x-ray and will treat as indicated Will begin mucinex 600 mg twice daily  for one week Will begin k+ 20 meq daily Will check albumin and k+ on 07-27-21 Will monitor her status.    Ok Edwards NP [facility]  Contact [phone] Monday through Friday 8am- 5pm  After hours call [phone]

## 2021-07-23 DIAGNOSIS — M25532 Pain in left wrist: Secondary | ICD-10-CM | POA: Diagnosis not present

## 2021-07-23 DIAGNOSIS — W19XXXA Unspecified fall, initial encounter: Secondary | ICD-10-CM | POA: Diagnosis not present

## 2021-07-23 DIAGNOSIS — M79642 Pain in left hand: Secondary | ICD-10-CM | POA: Diagnosis not present

## 2021-07-24 ENCOUNTER — Ambulatory Visit (HOSPITAL_COMMUNITY): Payer: Medicare PPO | Attending: Adult Health

## 2021-07-24 DIAGNOSIS — M11232 Other chondrocalcinosis, left wrist: Secondary | ICD-10-CM | POA: Insufficient documentation

## 2021-07-24 DIAGNOSIS — M19042 Primary osteoarthritis, left hand: Secondary | ICD-10-CM | POA: Insufficient documentation

## 2021-07-24 DIAGNOSIS — M25532 Pain in left wrist: Secondary | ICD-10-CM | POA: Insufficient documentation

## 2021-07-24 DIAGNOSIS — M7989 Other specified soft tissue disorders: Secondary | ICD-10-CM | POA: Diagnosis not present

## 2021-07-24 DIAGNOSIS — M79642 Pain in left hand: Secondary | ICD-10-CM | POA: Diagnosis not present

## 2021-07-27 DIAGNOSIS — E876 Hypokalemia: Secondary | ICD-10-CM | POA: Insufficient documentation

## 2021-07-27 LAB — ALBUMIN: Albumin: 3.7 g/dL (ref 3.5–5.0)

## 2021-07-27 LAB — POTASSIUM: Potassium: 3.9 mmol/L (ref 3.5–5.1)

## 2021-07-29 ENCOUNTER — Non-Acute Institutional Stay (SKILLED_NURSING_FACILITY): Payer: Medicare PPO | Admitting: Internal Medicine

## 2021-07-29 ENCOUNTER — Encounter: Payer: Self-pay | Admitting: Internal Medicine

## 2021-07-29 DIAGNOSIS — I10 Essential (primary) hypertension: Secondary | ICD-10-CM

## 2021-07-29 DIAGNOSIS — N183 Chronic kidney disease, stage 3 unspecified: Secondary | ICD-10-CM | POA: Diagnosis not present

## 2021-07-29 DIAGNOSIS — R7989 Other specified abnormal findings of blood chemistry: Secondary | ICD-10-CM | POA: Diagnosis not present

## 2021-07-29 DIAGNOSIS — D649 Anemia, unspecified: Secondary | ICD-10-CM

## 2021-07-29 DIAGNOSIS — E43 Unspecified severe protein-calorie malnutrition: Secondary | ICD-10-CM

## 2021-07-29 NOTE — Assessment & Plan Note (Signed)
Hemoglobin/hematocrit dropped from 11.3/37.1 down to 8.9/30.2.  Subsequent H/H was 10/34.4 with normocytic/slightly hypochromic values. Anemia progressed but subsequently improved. No bleeding dyscrasias reported by resident or staff. Close monitor indicated because of past history of partial colectomy for adenomas

## 2021-07-29 NOTE — Assessment & Plan Note (Signed)
BP controlled; no change in antihypertensive medications Hypokalemia & hypernatremia resolved.

## 2021-07-29 NOTE — Assessment & Plan Note (Signed)
Current albumin WNL; total protein needs update.

## 2021-07-29 NOTE — Assessment & Plan Note (Addendum)
Free T3 & Free T4 WNL. TSH recheck indicated to R/O subclinical hyperthyroidism.

## 2021-07-29 NOTE — Assessment & Plan Note (Addendum)
Current creatinine/GFR are 0.87/greater than 60 indicating CKD stage II. No nephrotoxic agents identified on Med List

## 2021-07-29 NOTE — Progress Notes (Signed)
   NURSING HOME LOCATION:   [facility] ROOM NUMBER:  8 W  CODE STATUS:  DNR  PCP: Ok Edwards NP  This is a nursing facility follow up visit of chronic medical diagnoses & to document compliance with Regulation 483.30 (c) in [employer] [place] Manual Phase 2 which mandates caregiver visit ( visits can alternate among physician, PA or NP as per statutes) within 10 days of 30 days / 60 days/ 90 days post admission to SNF date    Interim medical record and care since last SNF visit was updated with review of diagnostic studies and change in clinical status since last visit were documented.  HPI: She is a permanent resident of facility with diagnoses of chronic anemia, oxygen dependent COPD, degenerative disc disease, dementia, GERD, and OSA on CPAP. Surgeries and procedures include abdominal hysterectomy and nephrectomy, ankle surgery, and partial colon resection  Review of systems: Dementia invalidated responses. She could not give her room number. She could not identify individuals in photos on her dresser and simply pointed @ them.  Physical exam:  Pertinent or positive findings: Pleasantly demented. Some alopecia over crown. Bilateral ptosis.Complete dentures present. Thyroid not palpable. Rhythm slightly irregular.Decreased breath sounds.Protuberant abdomen.Decreased pedal pulses. All extremities strong to opposition.Slight trace edema @ sockline. Minimal clubbing of fingernails. Antiwandering bracelet on ankle.  General appearance: no acute distress, increased work of breathing is present.   Lymphatic: No lymphadenopathy about the head, neck, axilla. Eyes: No conjunctival inflammation or lid edema is present. There is no scleral icterus. Ears:  External ear exam shows no significant lesions or deformities.   Nose:  External nasal examination shows no deformity or inflammation. Nasal mucosa are pink and moist without lesions, exudates Oral exam: There is no  oropharyngeal erythema or exudate. Neck:  No thyromegaly, masses, tenderness noted.    Heart:  No gallop, murmur, click, rub .  Lungs:  without wheezes, rhonchi, rales, rubs. Abdomen: Bowel sounds are normal. Abdomen is soft and nontender with no organomegaly, hernias, masses. GU: Deferred  Extremities:  No cyanosis  Neurologic exam :Balance, Rhomberg, finger to nose testing could not be completed due to clinical state Skin: Warm & dry w/o tenting. No significant lesions or rash.  See summary under each active problem in the Problem List with associated updated therapeutic plan

## 2021-07-29 NOTE — Patient Instructions (Signed)
See assessment and plan under each diagnosis in the problem list and acutely for this visit 

## 2021-08-19 DIAGNOSIS — Z23 Encounter for immunization: Secondary | ICD-10-CM | POA: Diagnosis not present

## 2021-08-27 ENCOUNTER — Other Ambulatory Visit: Payer: Self-pay | Admitting: "Endocrinology

## 2021-08-27 DIAGNOSIS — R7989 Other specified abnormal findings of blood chemistry: Secondary | ICD-10-CM

## 2021-08-27 DIAGNOSIS — N183 Chronic kidney disease, stage 3 unspecified: Secondary | ICD-10-CM

## 2021-09-02 ENCOUNTER — Non-Acute Institutional Stay (SKILLED_NURSING_FACILITY): Payer: Medicare PPO | Admitting: Adult Health

## 2021-09-02 ENCOUNTER — Encounter: Payer: Self-pay | Admitting: Adult Health

## 2021-09-02 ENCOUNTER — Encounter (HOSPITAL_COMMUNITY)
Admission: RE | Admit: 2021-09-02 | Discharge: 2021-09-02 | Disposition: A | Discharge status: Home / Self Care | Payer: Medicare PPO | Source: Skilled Nursing Facility | Attending: Internal Medicine | Admitting: Internal Medicine

## 2021-09-02 DIAGNOSIS — R7989 Other specified abnormal findings of blood chemistry: Secondary | ICD-10-CM | POA: Diagnosis not present

## 2021-09-02 DIAGNOSIS — E43 Unspecified severe protein-calorie malnutrition: Secondary | ICD-10-CM | POA: Diagnosis not present

## 2021-09-02 DIAGNOSIS — J449 Chronic obstructive pulmonary disease, unspecified: Secondary | ICD-10-CM

## 2021-09-02 DIAGNOSIS — I10 Essential (primary) hypertension: Secondary | ICD-10-CM

## 2021-09-02 DIAGNOSIS — I129 Hypertensive chronic kidney disease with stage 1 through stage 4 chronic kidney disease, or unspecified chronic kidney disease: Secondary | ICD-10-CM | POA: Insufficient documentation

## 2021-09-02 DIAGNOSIS — N3281 Overactive bladder: Secondary | ICD-10-CM | POA: Diagnosis not present

## 2021-09-02 DIAGNOSIS — N183 Chronic kidney disease, stage 3 unspecified: Secondary | ICD-10-CM | POA: Diagnosis not present

## 2021-09-02 DIAGNOSIS — R0902 Hypoxemia: Secondary | ICD-10-CM | POA: Diagnosis not present

## 2021-09-02 LAB — COMPREHENSIVE METABOLIC PANEL
ALT: 17 U/L (ref 0–44)
AST: 17 U/L (ref 15–41)
Albumin: 4 g/dL (ref 3.5–5.0)
Alkaline Phosphatase: 62 U/L (ref 38–126)
Anion gap: 7 (ref 5–15)
BUN: 18 mg/dL (ref 8–23)
CO2: 29 mmol/L (ref 22–32)
Calcium: 9 mg/dL (ref 8.9–10.3)
Chloride: 103 mmol/L (ref 98–111)
Creatinine, Ser: 0.77 mg/dL (ref 0.44–1.00)
GFR, Estimated: >60 mL/min (ref >60)
Glucose, Bld: 84 mg/dL (ref 70–99)
Potassium: 3.7 mmol/L (ref 3.5–5.1)
Sodium: 139 mmol/L (ref 135–145)
Total Bilirubin: 0.5 mg/dL (ref 0.3–1.2)
Total Protein: 7.3 g/dL (ref 6.5–8.1)

## 2021-09-02 LAB — T4, FREE: Free T4: 0.93 ng/dL (ref 0.61–1.12)

## 2021-09-02 LAB — TSH: TSH: 1.075 u[IU]/mL (ref 0.350–4.500)

## 2021-09-02 NOTE — Progress Notes (Signed)
Location:  [place] Room Number: 149-W Place of Service:  SNF (31) Provider: Ok Edwards, NP   CODE STATUS: DNR  Allergies  Allergen Reactions   Fenofibrate Nausea And Vomiting   Pravastatin Sodium Nausea And Vomiting   Sulfonamide Derivatives Other (See Comments)    unknown   Ciprofloxacin Rash    Chief Complaint  Patient presents with   Medical Management of Chronic Issues           OAB:     Protein calorie malnutrition severe; COPD with hypoxia:  Essential hypertension    HPI:  She is a 80 year old long term resident of this facility being seen for the management of her chronic illnesses: OAB:     Protein calorie malnutrition severe; COPD with hypoxia:  Essential hypertension. There are no reports of uncontrolled pain. Her weight is stable at 167 pounds. There are no reports of insomnia or depressive thoughts.   Past Medical History:  Diagnosis Date   Abnormality of gait 06/06/2014   Allergic rhinitis    Anemia    Ankle fracture, right    Anxiety    Arm fracture, left    Arthritis    abnormal gait    COPD (chronic obstructive pulmonary disease) (HCC)    chronic CO2 retention, decreased DLCO    Cystic acne    adult    Degenerative disc disease, cervical    syrinx C3-7   Degenerative disc disease, lumbar    L5 nerve impingement    Dementia (HCC)    Depression    DNR (do not resuscitate)    Fracture of right proximal fibula 07/13/18 09/05/2018   GERD (gastroesophageal reflux disease)    Heme positive stool 10/10/2017   History of colon surgery    right hemicolectomy for high-grade adenomas in right colon 2013   Hyperglycemia    Hypertension    Low back pain    Mediastinal lymphadenopathy 1/9   resolving    Memory difficulty 09/20/2014   mild dementia   Onychomycosis    OSA on CPAP    Sleep apnea    stop bang score 6    Past Surgical History:  Procedure Laterality Date   ABDOMINAL HYSTERECTOMY  1976   secondary to bleeding     APPENDECTOMY     COLON RESECTION  08/18/2012   Procedure: HAND ASSISTED LAPAROSCOPIC COLON RESECTION;  Surgeon: Jamesetta So, MD;  Location: [facility];  Service: General;  Laterality: N/A;   COLONOSCOPY  07/20/2012   Dr. Gala Romney: multiple colonic polyps with large polyps on right side, s/p saline-assisted debulking piecemeal polypectomy and ablation. Not all removed. Path with tubulovillous adenomas, high grade dysplasia.    COLONOSCOPY N/A 03/09/2013   [id number] polyps-tubular adenomas. S/p right hemicolectomy. next tcs 02/2018   epidural steroids     ESOPHAGOGASTRODUODENOSCOPY (EGD) WITH ESOPHAGEAL DILATION N/A 02/06/2014   Procedure: ESOPHAGOGASTRODUODENOSCOPY (EGD) WITH ESOPHAGEAL DILATION;  Surgeon: Daneil Dolin, MD;  [facility];  Service: Endoscopy;  Laterality: N/A;  9:30   OOPHORECTOMY  1976   ORIF ANKLE FRACTURE  01/18/2012   Procedure: OPEN REDUCTION INTERNAL FIXATION (ORIF) ANKLE FRACTURE;  Surgeon: Arther Abbott, MD;  Location: [facility];  Service: Orthopedics;  Laterality: Left;   ORIF ANKLE FRACTURE Right 01/13/2016   Procedure: OPEN REDUCTION INTERNAL FIXATION (ORIF) ANKLE FRACTURE;  Surgeon: Carole Civil, MD;  Location: [facility];  Service: Orthopedics;  Laterality: Right;    Social History   Socioeconomic  History   Marital status: Widowed    Spouse name: Not on file   Number of children: 4   Years of education: college 1   Highest education level: Not on file  Occupational History   Occupation: employed in the tobacco industry     Employer: RETIRED   Occupation: retired  Tobacco Use   Smoking status: Former    Packs/day: 1.50    Years: 40.00    Pack years: 60.00    Types: Cigarettes    Quit date: 06/21/2006    Years since quitting: 15.2   Smokeless tobacco: Never  Vaping Use   Vaping Use: Never used  Substance and Sexual Activity   Alcohol use: No   Drug use: No   Sexual activity: Not Currently  Other Topics Concern   Not on file  Social History  Narrative   Long term resident of [facility]    Social Determinants of Health   Financial Resource Strain: Low Risk    Difficulty of Paying Living Expenses: Not hard at all  Food Insecurity: No Food Insecurity   Worried About Charity fundraiser in the Last Year: Never true   [place] in the Last Year: Never true  Transportation Needs: No Transportation Needs   Lack of Transportation (Medical): No   Lack of Transportation (Non-Medical): No  Physical Activity: Inactive   Days of Exercise per Week: 0 days   Minutes of Exercise per Session: 0 min  Stress: No Stress Concern Present   Feeling of Stress : Not at all  Social Connections: Socially Isolated   Frequency of Communication with Friends and Family: Three times a week   Frequency of Social Gatherings with Friends and Family: Once a week   Attends Religious Services: Never   Marine scientist or Organizations: No   Attends Archivist Meetings: Never   Marital Status: Widowed  Human resources officer Violence: Not At Risk   Fear of Current or Ex-Partner: No   Emotionally Abused: No   Physically Abused: No   Sexually Abused: No   Family History  Problem Relation Age of Onset   Stomach cancer Father    Cancer Father    Cancer Mother        brain    Breast cancer Mother    Arthritis Other       VITAL SIGNS BP (!) 151/77   Pulse 80   Temp 97.8 F (36.6 C)   Resp (!) 21   Ht 6\' 1"  (1.854 m)   Wt 167 lb 9.6 oz (76 kg)   SpO2 95%   BMI 22.11 kg/m   Outpatient Encounter Medications as of 09/02/2021  Medication Sig   acetaminophen (TYLENOL) 325 MG tablet Take 650 mg by mouth every 6 (six) hours.   albuterol (VENTOLIN HFA) 108 (90 Base) MCG/ACT inhaler Inhale 2 puffs into the lungs every 6 (six) hours as needed for wheezing or shortness of breath.   alendronate (FOSAMAX) 70 MG tablet Take 70 mg by mouth daily. Take on an empty stomach at least 30 mns prior to food. Sit upright x 37mns after taking meds.    amLODipine (NORVASC) 2.5 MG tablet Take 1 tablet (2.5 mg total) by mouth daily.   atorvastatin (LIPITOR) 10 MG tablet TAKE 1 TABLET EVERY DAY   CALCIUM CARBONATE-VITAMIN D PO Take by mouth. 600 mg-10 mcg, oral, Once A Day   Cholecalciferol (VITAMIN D3 PO) Take 1,000 Units by mouth daily.  donepezil (ARICEPT) 10 MG tablet TAKE 1 TABLET AT BEDTIME   loperamide (IMODIUM A-D) 2 MG tablet Take 2 mg by mouth as needed for diarrhea or loose stools.   loratadine (CLARITIN) 10 MG tablet Take 10 mg by mouth daily.   mirabegron ER (MYRBETRIQ) 25 MG TB24 tablet Take 25 mg by mouth daily.   mirtazapine (REMERON) 15 MG tablet TAKE 1 TABLET (15 MG TOTAL) BY MOUTH AT BEDTIME.   NON FORMULARY Diet-regular   omeprazole (PRILOSEC) 40 MG capsule TAKE 1 CAPSULE EVERY DAY   OXYGEN Inhale 2 L into the lungs continuous.   potassium chloride (KLOR-CON) 20 MEQ packet Take 20 mEq by mouth daily.   STRIVERDI RESPIMAT 2.5 MCG/ACT AERS INHALE 2 PUFFS ONE TIME DAILY AT THE SAME TIME   verapamil (CALAN-SR) 240 MG CR tablet TAKE 1 TABLET AT BEDTIME   [DISCONTINUED] Amino Acids-Protein Hydrolys (FEEDING SUPPLEMENT, PRO-STAT SUGAR FREE 64,) LIQD Take 30 mLs by mouth 3 (three) times daily with meals.   No facility-administered encounter medications on file as of 09/02/2021.     SIGNIFICANT DIAGNOSTIC EXAMS   LABS REVIEWED PREVIOUS  01-06-21: wbc 6.1; hgb 11.3; hct 37.1; mcv 88; plt 278; glucose 150; bun 24; creat 1.25; k+ 3.2; na++ 148; ca 8.9; GFR 47; liver normal albumin 3.7; chol 152; ldl 61; trig 125; hdl 69; vit D 63.3; tsh 0.407; free T4; 0.84 03-30-21: wbc 5.5; hgb 8.9; hct 30.2; mcv 92.9 plt 210; glucose 78; bun 23; creat 0.86; k+ 3.5; na++ 143; ca 8.6 GFR>60; liver normal albumin 2.9; chol 141; ldl 65; trig 67; hdl 63 06-02-21: urine culture: no growth; hep C neg  TODAY  07-21-21: wbc 4.8; hgb 10.0; hct 34.4; mcv 87.3 plt 224; glucose 138; bun 22; creat 0.87; k+ 3.4; na++ 130; ca 8.5; GFR>60 07-27-21: albumin 3.7;  k+ 3.9 09-02-21: glucose 84; bun 18; creat 0.77; k+ 3.7; na++ 139; ca 9.0; GFR>60; liver normal albumin 4.0 tsh 1.075 free t4: 0.93   Review of Systems  Constitutional:  Negative for malaise/fatigue.  Respiratory:  Negative for cough and shortness of breath.   Cardiovascular:  Negative for chest pain, palpitations and leg swelling.  Gastrointestinal:  Negative for abdominal pain, constipation and heartburn.  Musculoskeletal:  Negative for back pain, joint pain and myalgias.  Skin: Negative.   Neurological:  Negative for dizziness.  Psychiatric/Behavioral:  The patient is not nervous/anxious.     Physical Exam Constitutional:      General: She is not in acute distress.    Appearance: She is well-developed. She is not diaphoretic.  Neck:     Thyroid: No thyromegaly.  Cardiovascular:     Rate and Rhythm: Normal rate and regular rhythm.     Pulses: Normal pulses.     Heart sounds: Murmur heard.     Comments: 2/6 Pulmonary:     Effort: Pulmonary effort is normal. No respiratory distress.     Breath sounds: Normal breath sounds.     Comments: 02 Abdominal:     General: Bowel sounds are normal. There is no distension.     Palpations: Abdomen is soft.     Tenderness: There is no abdominal tenderness.  Musculoskeletal:        General: Normal range of motion.     Cervical back: Neck supple.     Right lower leg: No edema.     Left lower leg: No edema.  Lymphadenopathy:     Cervical: No cervical adenopathy.  Skin:  General: Skin is warm and dry.  Neurological:     Mental Status: She is alert. Mental status is at baseline.  Psychiatric:        Mood and Affect: Mood normal.       ASSESSMENT AND PLAN   TODAY  OAB: stable will continue myrbetriq 25 mg daily   2. Protein calorie malnutrition severe; albumin 4.0 will continue supplements as indicated  3. COPD with hypoxia: is stable 02 dependent will continue striverdi respimat 2.5 mcg 2 puffs daily claritin 10 mg daily  albuterol 2 puffs every 6 hours as needed   4. Essential hypertension: is stable b/p 151/77; will continue norvasc 2.5 mg daily calan sr 240 mg daily     PREVIOUS   5. Esophageal reflux disease without esophagitis: is stable will continue prilosec 40 mg daily   6. Vascular dementia without behavioral disturbance: is stable weight 167 pounds; will continue aricept 10 mg nightly  7. Generalized osteoarthritis of multiple sites: is stable has tylenol 650 mg every 8 hours as needed  8. Age related osteoporosis without current pathological fracture: is stable will continue fosamax 70 mg weekly   9. Stage 3a chronic kidney disease: is stable bun 18; creat 0.77; GFR >60  10. Normocytic anemia: is stable hgb 8.9 will monitor   11. 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 Low thyroid stimulating hormone (TSH) is stable tsh 1.075 free t4: 0.93 will monitor   13. Psychophysiological insomnia: will continue remeron 15 mg nightly   14. Hyperlipidemia LDL goal <100: ldl 65: will continue lipitor 10 mg daily        Ok Edwards NP [facility]  Contact [phone] Monday through Friday 8am- 5pm  After hours call [phone]

## 2021-09-03 ENCOUNTER — Encounter: Payer: Self-pay | Admitting: "Endocrinology

## 2021-09-03 ENCOUNTER — Ambulatory Visit (INDEPENDENT_AMBULATORY_CARE_PROVIDER_SITE_OTHER): Payer: Medicare PPO | Admitting: "Endocrinology

## 2021-09-03 VITALS — BP 120/78 | HR 80 | Ht 73.0 in

## 2021-09-03 DIAGNOSIS — R7989 Other specified abnormal findings of blood chemistry: Secondary | ICD-10-CM | POA: Diagnosis not present

## 2021-09-03 DIAGNOSIS — M810 Age-related osteoporosis without current pathological fracture: Secondary | ICD-10-CM | POA: Diagnosis not present

## 2021-09-03 NOTE — Progress Notes (Signed)
09/03/2021        Endocrinology follow-up note   [name] Date of Birth:  November 06, 1941   Past Medical History:  Diagnosis Date   Abnormality of gait 06/06/2014   Allergic rhinitis    Anemia    Ankle fracture, right    Anxiety    Arm fracture, left    Arthritis    abnormal gait    COPD (chronic obstructive pulmonary disease) ([place])    chronic CO2 retention, decreased DLCO    Cystic acne    adult    Degenerative disc disease, cervical    syrinx C3-7   Degenerative disc disease, lumbar    L5 nerve impingement    Dementia (HCC)    Depression    DNR (do not resuscitate)    Fracture of right proximal fibula 07/13/18 09/05/2018   GERD (gastroesophageal reflux disease)    Heme positive stool 10/10/2017   History of colon surgery    right hemicolectomy for high-grade adenomas in right colon 2013   Hyperglycemia    Hypertension    Low back pain    Mediastinal lymphadenopathy 1/9   resolving    Memory difficulty 09/20/2014   mild dementia   Onychomycosis    OSA on CPAP    Sleep apnea    stop bang score 6   Past Surgical History:  Procedure Laterality Date   ABDOMINAL HYSTERECTOMY  1976   secondary to bleeding    APPENDECTOMY     COLON RESECTION  08/18/2012   Procedure: HAND ASSISTED LAPAROSCOPIC COLON RESECTION;  Surgeon: Jamesetta So, MD;  Location: [facility];  Service: General;  Laterality: N/A;   COLONOSCOPY  07/20/2012   Dr. Gala Romney: multiple colonic polyps with large polyps on right side, s/p saline-assisted debulking piecemeal polypectomy and ablation. Not all removed. Path with tubulovillous adenomas, high grade dysplasia.    COLONOSCOPY N/A 03/09/2013   NIO:[id number] polyps-tubular adenomas. S/p right hemicolectomy. next tcs 02/2018   epidural steroids     ESOPHAGOGASTRODUODENOSCOPY (EGD) WITH ESOPHAGEAL DILATION N/A 02/06/2014   Procedure: ESOPHAGOGASTRODUODENOSCOPY (EGD) WITH ESOPHAGEAL  DILATION;  Surgeon: Daneil Dolin, MD;  Location: [facility];  Service: Endoscopy;  Laterality: N/A;  9:30   OOPHORECTOMY  1976   ORIF ANKLE FRACTURE  01/18/2012   Procedure: OPEN REDUCTION INTERNAL FIXATION (ORIF) ANKLE FRACTURE;  Surgeon: Arther Abbott, MD;  Location: [facility];  Service: Orthopedics;  Laterality: Left;   ORIF ANKLE FRACTURE Right 01/13/2016   Procedure: OPEN REDUCTION INTERNAL FIXATION (ORIF) ANKLE FRACTURE;  Surgeon: Carole Civil, MD;  Location: [facility];  Service: Orthopedics;  Laterality: Right;   Social History   Socioeconomic History   Marital status: Widowed    Spouse name: Not on file   Number of children: 4   Years of education: college 1   Highest education level: Not on file  Occupational History   Occupation: employed in the tobacco industry     Employer: RETIRED   Occupation: retired  Tobacco Use   Smoking status: Former    Packs/day: 1.50    Years: 40.00    Pack years: 60.00    Types:  Cigarettes    Quit date: 06/21/2006    Years since quitting: 15.2   Smokeless tobacco: Never  Vaping Use   Vaping Use: Never used  Substance and Sexual Activity   Alcohol use: No   Drug use: No   Sexual activity: Not Currently  Other Topics Concern   Not on file  Social History Narrative   Long term resident of [facility]    Social Determinants of Health   Financial Resource Strain: Low Risk    Difficulty of Paying Living Expenses: Not hard at all  Food Insecurity: No Food Insecurity   Worried About Charity fundraiser in the Last Year: Never true   [place] in the Last Year: Never true  Transportation Needs: No Transportation Needs   Lack of Transportation (Medical): No   Lack of Transportation (Non-Medical): No  Physical Activity: Inactive   Days of Exercise per Week: 0 days   Minutes of Exercise per Session: 0 min  Stress: No Stress Concern Present   Feeling of Stress : Not at all  Social Connections: Socially Isolated   Frequency of  Communication with Friends and Family: Three times a week   Frequency of Social Gatherings with Friends and Family: Once a week   Attends Religious Services: Never   Marine scientist or Organizations: No   Attends Archivist Meetings: Never   Marital Status: Widowed   Outpatient Encounter Medications as of 09/03/2021  Medication Sig   acetaminophen (TYLENOL) 325 MG tablet Take 650 mg by mouth every 6 (six) hours.   albuterol (VENTOLIN HFA) 108 (90 Base) MCG/ACT inhaler Inhale 2 puffs into the lungs every 6 (six) hours as needed for wheezing or shortness of breath.   alendronate (FOSAMAX) 70 MG tablet Take 70 mg by mouth once a week. Take on an empty stomach at least 30 mns prior to food. Sit upright x 86mns after taking meds.   amLODipine (NORVASC) 2.5 MG tablet Take 1 tablet (2.5 mg total) by mouth daily.   atorvastatin (LIPITOR) 10 MG tablet TAKE 1 TABLET EVERY DAY   CALCIUM CARBONATE-VITAMIN D PO Take by mouth. 600 mg-10 mcg, oral, Once A Day   Cholecalciferol (VITAMIN D3 PO) Take 1,000 Units by mouth daily.   donepezil (ARICEPT) 10 MG tablet TAKE 1 TABLET AT BEDTIME   loperamide (IMODIUM A-D) 2 MG tablet Take 2 mg by mouth as needed for diarrhea or loose stools.   loratadine (CLARITIN) 10 MG tablet Take 10 mg by mouth daily.   mirabegron ER (MYRBETRIQ) 25 MG TB24 tablet Take 25 mg by mouth daily.   mirtazapine (REMERON) 15 MG tablet TAKE 1 TABLET (15 MG TOTAL) BY MOUTH AT BEDTIME.   NON FORMULARY Diet-regular   omeprazole (PRILOSEC) 40 MG capsule TAKE 1 CAPSULE EVERY DAY   OXYGEN Inhale 2 L into the lungs continuous.   potassium chloride (KLOR-CON) 20 MEQ packet Take 20 mEq by mouth daily.   STRIVERDI RESPIMAT 2.5 MCG/ACT AERS INHALE 2 PUFFS ONE TIME DAILY AT THE SAME TIME   verapamil (CALAN-SR) 240 MG CR tablet TAKE 1 TABLET AT BEDTIME   No facility-administered encounter medications on file as of 09/03/2021.   ALLERGIES: Allergies  Allergen Reactions    Fenofibrate Nausea And Vomiting   Pravastatin Sodium Nausea And Vomiting   Sulfonamide Derivatives Other (See Comments)    unknown   Ciprofloxacin Rash    VACCINATION STATUS: Immunization History  Administered Date(s) Administered   Fluad Quad(high Dose  65+) 07/10/2019, 08/22/2020   Influenza Split 08/25/2011, 07/25/2012   Influenza Whole 09/12/2006, 08/25/2007, 08/01/2008, 08/20/2009, 08/20/2010   Influenza, High Dose Seasonal PF 12/13/2018   Influenza,inj,Quad PF,6+ Mos 08/21/2013, 09/04/2014, 08/13/2015, 07/08/2016, 09/23/2017, 08/19/2021   Moderna Sars-Covid-2 Vaccination 01/10/2020, 02/08/2020   PFIZER Comirnaty(Gray Top)Covid-19 Tri-Sucrose Vaccine 09/23/2020, 03/05/2021   Pneumococcal Conjugate-13 06/20/2014   Pneumococcal Polysaccharide-23 09/12/2006, 08/20/2010, 04/16/2011   Td 08/20/2010   Zoster, Live 12/26/2013     HPI   TIERRAH ANASTOS is 80 y.o. female who presents today with a medical history as above. she is being seen in follow-up after she was seen in consultation for osteoporosis requested by Gerlene Fee, NP. Review of her medical records including review of her previous DEXA scan did not show T score less than -2.5, however by fracture criteria she is a patient with age-related osteoporosis.  She gives history of previous fragility fracture of wrist, ankle.  These fractures happened prior to her initiation on Fosamax 5 years ago. She has a tendency to fall, currently on a wheelchair for safety reasons.  She is accompanied by her daughter to clinic.    She has been on the following OP treatments: Alendronate 70 mg weekly for at least 5 years.  Reportedly, she continues to tolerate this medication very well.    -  She has no side effects, contraindications. She is also on vitamin D supplements along with low-dose calcium.  She also eats dairy and green, leafy, vegetables.   No weight bearing exercises.  No h/o hyper/hypocalcemia. No h/o  hyperparathyroidism. No h/o kidney stones.  No h/o thyrotoxicosis.  Her previsit labs showed euthyroid state. -She continues to have normal renal function.  Pt does not have a FH of osteoporosis.   Review of Systems  Constitutional: + Intimately fluctuating body weight,  no fatigue, no subjective hyperthermia, no subjective hypothermia, + wheelchair-bound.   Objective:    BP 120/78   Pulse 80   Ht 6\' 1"  (1.854 m)   BMI 22.11 kg/m   Wt Readings from Last 3 Encounters:  09/02/21 167 lb 9.6 oz (76 kg)  07/21/21 175 lb 3.2 oz (79.5 kg)  07/16/21 175 lb 9.6 oz (79.7 kg)    Physical Exam  Constitutional:  + BMI of 25.5, not in acute distress, normal state of mind, + on a wheelchair. Eyes: PERRLA, EOMI, no exophthalmos ENT: moist mucous membranes, no thyromegaly, no cervical lymphadenopathy   CMP ( most recent) CMP     Component Value Date/Time   NA 139 09/02/2021 0650   NA 148 (H) 01/06/2021 1148   K 3.7 09/02/2021 0650   CL 103 09/02/2021 0650   CO2 29 09/02/2021 0650   GLUCOSE 84 09/02/2021 0650   BUN 18 09/02/2021 0650   BUN 24 01/06/2021 1148   CREATININE 0.77 09/02/2021 0650   CREATININE 1.01 (H) 12/18/2019 1201   CALCIUM 9.0 09/02/2021 0650   PROT 7.3 09/02/2021 0650   PROT 6.1 01/06/2021 1148   ALBUMIN 4.0 09/02/2021 0650   ALBUMIN 3.7 01/06/2021 1148   AST 17 09/02/2021 0650   ALT 17 09/02/2021 0650   ALKPHOS 62 09/02/2021 0650   BILITOT 0.5 09/02/2021 0650   BILITOT 0.2 01/06/2021 1148   GFRNONAA >60 09/02/2021 0650   GFRNONAA 53 (L) 12/18/2019 1201   GFRAA 47 (L) 01/06/2021 1148   GFRAA 62 12/18/2019 1201     Diabetic Labs (most recent): Lab Results  Component Value Date   HGBA1C 5.8 01/23/2010  Lipid Panel ( most recent) Lipid Panel     Component Value Date/Time   CHOL 141 03/30/2021 0735   CHOL 152 01/06/2021 1148   TRIG 67 03/30/2021 0735   HDL 63 03/30/2021 0735   HDL 69 01/06/2021 1148   CHOLHDL 2.2 03/30/2021 0735   VLDL 13  03/30/2021 0735   LDLCALC 65 03/30/2021 0735   LDLCALC 61 01/06/2021 1148   LDLCALC 75 12/18/2019 1201   LABVLDL 22 01/06/2021 1148      Lab Results  Component Value Date   TSH 1.075 09/02/2021   TSH 0.407 (L) 01/06/2021   TSH 0.336 (L) 07/14/2020   TSH 0.55 07/10/2019   TSH 0.79 04/14/2018   TSH 0.72 07/08/2016   TSH 1.028 11/06/2015   TSH 0.705 04/18/2014   TSH 0.712 11/20/2012   TSH 0.643 04/16/2011   FREET4 0.93 09/02/2021   FREET4 0.84 01/06/2021   FREET4 0.96 07/14/2020          Assessment: 1. Osteoporosis by history of fragility fracture 2.  Dual femur total T score -1.7  Plan: 1. 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   We reviewed her DEXA scans together (3 of them since 2010), and I explained that based on the T scores, she still has an increased risk for fractures.   - We discussed about the different medication classes, benefits and side effects (including atypical fractures and ONJ - no dental workup in progress or planned).   -She continues to tolerate alendronate, for 5 years now.  She will be continued on the same management along with low-dose calcium and vitamin D supplement.    -After approximately 8 years of treatment, she will be considered for drug holiday or switch to a different modality depending on her bone density in 2023. Her work-up is negative for secondary cause of osteoporosis.  Her previously suppressed TSH is now back to normal.  She will return in 6 months with CMP.  Her next bone density is not due until September 2023. - we reviewed her dietary and supplemental calcium and vitamin D intake, which I believe are adequate.   - discussed fall precautions   - I did not initiate any new prescriptions today. - I advised patient to maintain close follow up with Gerlene Fee, NP for primary care needs.  I spent 25 minutes in the care of the patient today  including review of labs from Thyroid Function, CMP, and other relevant labs ; imaging/biopsy records (current and previous including abstractions from other facilities); face-to-face time discussing  her lab results and symptoms, medications doses, her options of short and long term treatment based on the latest standards of care / guidelines;   and documenting the encounter.  [name]  participated in the discussions, expressed understanding, and voiced agreement with the above plans.  All questions were answered to her satisfaction. she is encouraged to contact clinic should she have any questions or concerns prior to her return visit.  Follow up plan: Return in about 6 months (around 03/04/2022) for F/U with Pre-visit Labs.   Glade Lloyd, MD [facility] [address] [place], [place] [address] Phone: [phone]  Fax: [phone]     09/03/2021, 12:12 PM  This note was partially dictated with voice recognition software. Similar sounding words can be transcribed inadequately or may not  be corrected upon review.

## 2021-09-10 ENCOUNTER — Non-Acute Institutional Stay (SKILLED_NURSING_FACILITY): Payer: Medicare PPO | Admitting: Adult Health

## 2021-09-10 ENCOUNTER — Encounter: Payer: Self-pay | Admitting: Adult Health

## 2021-09-10 DIAGNOSIS — B372 Candidiasis of skin and nail: Secondary | ICD-10-CM | POA: Diagnosis not present

## 2021-09-10 NOTE — Progress Notes (Signed)
Location:  [place] Room Number: 149 Place of Service:   SNF    CODE STATUS: dnr   Allergies  Allergen Reactions   Fenofibrate Nausea And Vomiting   Pravastatin Sodium Nausea And Vomiting   Sulfonamide Derivatives Other (See Comments)    unknown   Ciprofloxacin Rash    Chief Complaint  Patient presents with   Acute Visit    Rash on feet     HPI:  Staff report that she has developed a fungal rash on both feet. The nursing staff have applied nystatin topically with little relief. She denies any pain or itching present. There are no report of fevers present.   Past Medical History:  Diagnosis Date   Abnormality of gait 06/06/2014   Allergic rhinitis    Anemia    Ankle fracture, right    Anxiety    Arm fracture, left    Arthritis    abnormal gait    COPD (chronic obstructive pulmonary disease) (HCC)    chronic CO2 retention, decreased DLCO    Cystic acne    adult    Degenerative disc disease, cervical    syrinx C3-7   Degenerative disc disease, lumbar    L5 nerve impingement    Dementia (HCC)    Depression    DNR (do not resuscitate)    Fracture of right proximal fibula 07/13/18 09/05/2018   GERD (gastroesophageal reflux disease)    Heme positive stool 10/10/2017   History of colon surgery    right hemicolectomy for high-grade adenomas in right colon 2013   Hyperglycemia    Hypertension    Low back pain    Mediastinal lymphadenopathy 1/9   resolving    Memory difficulty 09/20/2014   mild dementia   Onychomycosis    OSA on CPAP    Sleep apnea    stop bang score 6    Past Surgical History:  Procedure Laterality Date   ABDOMINAL HYSTERECTOMY  1976   secondary to bleeding    APPENDECTOMY     COLON RESECTION  08/18/2012   Procedure: HAND ASSISTED LAPAROSCOPIC COLON RESECTION;  Surgeon: Jamesetta So, MD;  Location: [facility];  Service: General;  Laterality: N/A;   COLONOSCOPY  07/20/2012   Dr. Gala Romney: multiple colonic polyps with  large polyps on right side, s/p saline-assisted debulking piecemeal polypectomy and ablation. Not all removed. Path with tubulovillous adenomas, high grade dysplasia.    COLONOSCOPY N/A 03/09/2013   TFT:DDUKGUR polyps-tubular adenomas. S/p right hemicolectomy. next tcs 02/2018   epidural steroids     ESOPHAGOGASTRODUODENOSCOPY (EGD) WITH ESOPHAGEAL DILATION N/A 02/06/2014   Procedure: ESOPHAGOGASTRODUODENOSCOPY (EGD) WITH ESOPHAGEAL DILATION;  Surgeon: Daneil Dolin, MD;  [facility];  Service: Endoscopy;  Laterality: N/A;  9:30   OOPHORECTOMY  1976   ORIF ANKLE FRACTURE  01/18/2012   Procedure: OPEN REDUCTION INTERNAL FIXATION (ORIF) ANKLE FRACTURE;  Surgeon: Arther Abbott, MD;  Location: [facility];  Service: Orthopedics;  Laterality: Left;   ORIF ANKLE FRACTURE Right 01/13/2016   Procedure: OPEN REDUCTION INTERNAL FIXATION (ORIF) ANKLE FRACTURE;  Surgeon: Carole Civil, MD;  Location: [facility];  Service: Orthopedics;  Laterality: Right;    Social History   Socioeconomic History   Marital status: Widowed    Spouse name: Not on file   Number of children: 4   Years of education: college 1   Highest education level: Not on file  Occupational History   Occupation: employed in the tobacco  industry     Employer: RETIRED   Occupation: retired  Tobacco Use   Smoking status: Former    Packs/day: 1.50    Years: 40.00    Pack years: 60.00    Types: Cigarettes    Quit date: 06/21/2006    Years since quitting: 15.2   Smokeless tobacco: Never  Vaping Use   Vaping Use: Never used  Substance and Sexual Activity   Alcohol use: No   Drug use: No   Sexual activity: Not Currently  Other Topics Concern   Not on file  Social History Narrative   [facility] term resident of [facility]    Social Determinants of Health   Financial Resource Strain: Low Risk    Difficulty of Paying Living Expenses: Not hard at all  Food Insecurity: No Food Insecurity   Worried About Charity fundraiser in the Last  Year: Never true   [place] in the Last Year: Never true  Transportation Needs: No Transportation Needs   Lack of Transportation (Medical): No   Lack of Transportation (Non-Medical): No  Physical Activity: Inactive   Days of Exercise per Week: 0 days   Minutes of Exercise per Session: 0 min  Stress: No Stress Concern Present   Feeling of Stress : Not at all  Social Connections: Socially Isolated   Frequency of Communication with Friends and Family: Three times a week   Frequency of Social Gatherings with Friends and Family: Once a week   Attends Religious Services: Never   Marine scientist or Organizations: No   Attends Archivist Meetings: Never   Marital Status: Widowed  Human resources officer Violence: Not At Risk   Fear of Current or Ex-Partner: No   Emotionally Abused: No   Physically Abused: No   Sexually Abused: No   Family History  Problem Relation Age of Onset   Stomach cancer Father    Cancer Father    Cancer Mother        brain    Breast cancer Mother    Arthritis Other       VITAL SIGNS BP 126/74   Pulse 68   Temp 97.8 F (36.6 C)   Ht 6\' 1"  (1.854 m)   Wt 167 lb 9.6 oz (76 kg)   BMI 22.11 kg/m   Outpatient Encounter Medications as of 09/10/2021  Medication Sig   acetaminophen (TYLENOL) 325 MG tablet Take 650 mg by mouth every 6 (six) hours.   albuterol (VENTOLIN HFA) 108 (90 Base) MCG/ACT inhaler Inhale 2 puffs into the lungs every 6 (six) hours as needed for wheezing or shortness of breath.   alendronate (FOSAMAX) 70 MG tablet Take 70 mg by mouth once a week. Take on an empty stomach at least 30 mns prior to food. Sit upright x 81mns after taking meds.   amLODipine (NORVASC) 2.5 MG tablet Take 1 tablet (2.5 mg total) by mouth daily.   atorvastatin (LIPITOR) 10 MG tablet TAKE 1 TABLET EVERY DAY   CALCIUM CARBONATE-VITAMIN D PO Take by mouth. 600 mg-10 mcg, oral, Once A Day   Cholecalciferol (VITAMIN D3 PO) Take 1,000 Units by mouth  daily.   donepezil (ARICEPT) 10 MG tablet TAKE 1 TABLET AT BEDTIME   loperamide (IMODIUM A-D) 2 MG tablet Take 2 mg by mouth as needed for diarrhea or loose stools.   loratadine (CLARITIN) 10 MG tablet Take 10 mg by mouth daily.   mirabegron ER (MYRBETRIQ) 25 MG TB24 tablet Take  25 mg by mouth daily.   mirtazapine (REMERON) 15 MG tablet TAKE 1 TABLET (15 MG TOTAL) BY MOUTH AT BEDTIME.   NON FORMULARY Diet-regular   omeprazole (PRILOSEC) 40 MG capsule TAKE 1 CAPSULE EVERY DAY   OXYGEN Inhale 2 L into the lungs continuous.   potassium chloride (KLOR-CON) 20 MEQ packet Take 20 mEq by mouth daily.   STRIVERDI RESPIMAT 2.5 MCG/ACT AERS INHALE 2 PUFFS ONE TIME DAILY AT THE SAME TIME   verapamil (CALAN-SR) 240 MG CR tablet TAKE 1 TABLET AT BEDTIME   No facility-administered encounter medications on file as of 09/10/2021.     SIGNIFICANT DIAGNOSTIC EXAMS   LABS REVIEWED PREVIOUS  01-06-21: wbc 6.1; hgb 11.3; hct 37.1; mcv 88; plt 278; glucose 150; bun 24; creat 1.25; k+ 3.2; na++ 148; ca 8.9; GFR 47; liver normal albumin 3.7; chol 152; ldl 61; trig 125; hdl 69; vit D 63.3; tsh 0.407; free T4; 0.84 03-30-21: wbc 5.5; hgb 8.9; hct 30.2; mcv 92.9 plt 210; glucose 78; bun 23; creat 0.86; k+ 3.5; na++ 143; ca 8.6 GFR>60; liver normal albumin 2.9; chol 141; ldl 65; trig 67; hdl 63 06-02-21: urine culture: no growth; hep C neg 07-21-21: wbc 4.8; hgb 10.0; hct 34.4; mcv 87.3 plt 224; glucose 138; bun 22; creat 0.87; k+ 3.4; na++ 130; ca 8.5; GFR>60 07-27-21: albumin 3.7; k+ 3.9 09-02-21: glucose 84; bun 18; creat 0.77; k+ 3.7; na++ 139; ca 9.0; GFR>60; liver normal albumin 4.0 tsh 1.075 free t4: 0.93   NO NEW LABS   Review of Systems  Constitutional:  Negative for malaise/fatigue.  Respiratory:  Negative for cough and shortness of breath.   Cardiovascular:  Negative for chest pain, palpitations and leg swelling.  Gastrointestinal:  Negative for abdominal pain, constipation and heartburn.   Musculoskeletal:  Negative for back pain, joint pain and myalgias.  Skin:  Positive for rash.  Neurological:  Negative for dizziness.  Psychiatric/Behavioral:  The patient is not nervous/anxious.    Physical Exam Constitutional:      General: She is not in acute distress.    Appearance: She is well-developed. She is not diaphoretic.  Neck:     Thyroid: No thyromegaly.  Cardiovascular:     Rate and Rhythm: Normal rate and regular rhythm.     Pulses: Normal pulses.     Heart sounds: Murmur heard.     Comments: 2/6 Pulmonary:     Effort: Pulmonary effort is normal. No respiratory distress.     Breath sounds: Normal breath sounds.     Comments: 02 Abdominal:     General: Bowel sounds are normal. There is no distension.     Palpations: Abdomen is soft.     Tenderness: There is no abdominal tenderness.  Musculoskeletal:        General: Normal range of motion.     Cervical back: Neck supple.     Right lower leg: No edema.     Left lower leg: No edema.  Lymphadenopathy:     Cervical: No cervical adenopathy.  Skin:    General: Skin is warm and dry.     Findings: Rash present.     Comments: Has significant fungal rash on bilateral feet.   Neurological:     Mental Status: She is alert. Mental status is at baseline.  Psychiatric:        Mood and Affect: Mood normal.     ASSESSMENT/ PLAN:  TODAY  Candidosis of skin :to bilateral feet: will begin diflucan  150 mg daily through 09-24-21; will monitor her status.    Ok Edwards NP [facility]  Contact [phone] Monday through Friday 8am- 5pm  After hours call [phone]

## 2021-09-18 ENCOUNTER — Other Ambulatory Visit (HOSPITAL_BASED_OUTPATIENT_CLINIC_OR_DEPARTMENT_OTHER): Payer: Self-pay

## 2021-09-18 DIAGNOSIS — R0681 Apnea, not elsewhere classified: Secondary | ICD-10-CM

## 2021-09-18 DIAGNOSIS — G2581 Restless legs syndrome: Secondary | ICD-10-CM

## 2021-10-05 ENCOUNTER — Non-Acute Institutional Stay (SKILLED_NURSING_FACILITY): Payer: Medicare PPO | Admitting: Adult Health

## 2021-10-05 ENCOUNTER — Encounter: Payer: Self-pay | Admitting: Adult Health

## 2021-10-05 DIAGNOSIS — F01C0 Vascular dementia, severe, without behavioral disturbance, psychotic disturbance, mood disturbance, and anxiety: Secondary | ICD-10-CM

## 2021-10-05 DIAGNOSIS — M810 Age-related osteoporosis without current pathological fracture: Secondary | ICD-10-CM | POA: Diagnosis not present

## 2021-10-05 DIAGNOSIS — K219 Gastro-esophageal reflux disease without esophagitis: Secondary | ICD-10-CM | POA: Diagnosis not present

## 2021-10-05 DIAGNOSIS — M159 Polyosteoarthritis, unspecified: Secondary | ICD-10-CM

## 2021-10-05 NOTE — Progress Notes (Signed)
Location:  [place] Room Number: 149 Place of Service:  SNF (31) Provider: Ok Edwards, NP  CODE STATUS: DNR  Allergies  Allergen Reactions   Fenofibrate Nausea And Vomiting   Pravastatin Sodium Nausea And Vomiting   Sulfonamide Derivatives Other (See Comments)    unknown   Ciprofloxacin Rash    Chief Complaint  Patient presents with   Medical Management of Chronic Issues                Esophageal reflux disease without esophagitis:  Vascular dementia without behavioral disturbance: Generalized osteoarthritis of multiple sites:  Age related osteoporosis without current pathological fracture:     HPI:  She is an 80 year old long term resident of this facility being seen for the management of her chronic illnesses: Esophageal reflux disease without esophagitis:  Vascular dementia without behavioral disturbance: Generalized osteoarthritis of multiple sites:  Age related osteoporosis without current pathological fracture. There are no reports of uncontrolled pain. She does have an occasional fall; without injury. There are no reports of heartburn.   Past Medical History:  Diagnosis Date   Abnormality of gait 06/06/2014   Allergic rhinitis    Anemia    Ankle fracture, right    Anxiety    Arm fracture, left    Arthritis    abnormal gait    COPD (chronic obstructive pulmonary disease) (HCC)    chronic CO2 retention, decreased DLCO    Cystic acne    adult    Degenerative disc disease, cervical    syrinx C3-7   Degenerative disc disease, lumbar    L5 nerve impingement    Dementia (HCC)    Depression    DNR (do not resuscitate)    Fracture of right proximal fibula 07/13/18 09/05/2018   GERD (gastroesophageal reflux disease)    Heme positive stool 10/10/2017   History of colon surgery    right hemicolectomy for high-grade adenomas in right colon 2013   Hyperglycemia    Hypertension    Low back pain    Mediastinal lymphadenopathy 1/9   resolving     Memory difficulty 09/20/2014   mild dementia   Onychomycosis    OSA on CPAP    Sleep apnea    stop bang score 6    Past Surgical History:  Procedure Laterality Date   ABDOMINAL HYSTERECTOMY  1976   secondary to bleeding    APPENDECTOMY     COLON RESECTION  08/18/2012   Procedure: HAND ASSISTED LAPAROSCOPIC COLON RESECTION;  Surgeon: Jamesetta So, MD;  Location: [facility];  Service: General;  Laterality: N/A;   COLONOSCOPY  07/20/2012   Dr. Gala Romney: multiple colonic polyps with large polyps on right side, s/p saline-assisted debulking piecemeal polypectomy and ablation. Not all removed. Path with tubulovillous adenomas, high grade dysplasia.    COLONOSCOPY N/A 03/09/2013   PPI:RJJOACZ polyps-tubular adenomas. S/p right hemicolectomy. next tcs 02/2018   epidural steroids     ESOPHAGOGASTRODUODENOSCOPY (EGD) WITH ESOPHAGEAL DILATION N/A 02/06/2014   Procedure: ESOPHAGOGASTRODUODENOSCOPY (EGD) WITH ESOPHAGEAL DILATION;  Surgeon: Daneil Dolin, MD;  Location[facility];  Service: Endoscopy;  Laterality: N/A;  9:30   OOPHORECTOMY  1976   ORIF ANKLE FRACTURE  01/18/2012   Procedure: OPEN REDUCTION INTERNAL FIXATION (ORIF) ANKLE FRACTURE;  Surgeon: Arther Abbott, MD;  Location: [facility];  Service: Orthopedics;  Laterality: Left;   ORIF ANKLE FRACTURE Right 01/13/2016   Procedure: OPEN REDUCTION INTERNAL FIXATION (ORIF) ANKLE FRACTURE;  Surgeon: Dorothyann Peng  Vela Prose, MD;  Location: [facility];  Service: Orthopedics;  Laterality: Right;    Social History   Socioeconomic History   Marital status: Widowed    Spouse name: Not on file   Number of children: 4   Years of education: college 1   Highest education level: Not on file  Occupational History   Occupation: employed in the tobacco industry     Employer: RETIRED   Occupation: retired  Tobacco Use   Smoking status: Former    Packs/day: 1.50    Years: 40.00    Pack years: 60.00    Types: Cigarettes    Quit date: 06/21/2006    Years since  quitting: 15.3   Smokeless tobacco: Never  Vaping Use   Vaping Use: Never used  Substance and Sexual Activity   Alcohol use: No   Drug use: No   Sexual activity: Not Currently  Other Topics Concern   Not on file  Social History Narrative   Long term resident of [facility]    Social Determinants of Health   Financial Resource Strain: Low Risk    Difficulty of Paying Living Expenses: Not hard at all  Food Insecurity: No Food Insecurity   Worried About Charity fundraiser in the Last Year: Never true   [place] in the Last Year: Never true  Transportation Needs: No Transportation Needs   Lack of Transportation (Medical): No   Lack of Transportation (Non-Medical): No  Physical Activity: Inactive   Days of Exercise per Week: 0 days   Minutes of Exercise per Session: 0 min  Stress: No Stress Concern Present   Feeling of Stress : Not at all  Social Connections: Socially Isolated   Frequency of Communication with Friends and Family: Three times a week   Frequency of Social Gatherings with Friends and Family: Once a week   Attends Religious Services: Never   Marine scientist or Organizations: No   Attends Archivist Meetings: Never   Marital Status: Widowed  Human resources officer Violence: Not At Risk   Fear of Current or Ex-Partner: No   Emotionally Abused: No   Physically Abused: No   Sexually Abused: No   Family History  Problem Relation Age of Onset   Stomach cancer Father    Cancer Father    Cancer Mother        brain    Breast cancer Mother    Arthritis Other       VITAL SIGNS BP 107/65   Pulse 96   Temp 98.2 F (36.8 C)   Resp (!) 21   Ht 6\' 1"  (1.854 m)   Wt 163 lb (73.9 kg)   SpO2 98%   BMI 21.51 kg/m   Outpatient Encounter Medications as of 10/05/2021  Medication Sig   acetaminophen (TYLENOL) 325 MG tablet Take 650 mg by mouth every 6 (six) hours.   albuterol (VENTOLIN HFA) 108 (90 Base) MCG/ACT inhaler Inhale 2 puffs into the lungs  every 6 (six) hours as needed for wheezing or shortness of breath.   alendronate (FOSAMAX) 70 MG tablet Take 70 mg by mouth once a week. Take on an empty stomach at least 30 mns prior to food. Sit upright x 65mns after taking meds.   amLODipine (NORVASC) 2.5 MG tablet Take 1 tablet (2.5 mg total) by mouth daily.   atorvastatin (LIPITOR) 10 MG tablet TAKE 1 TABLET EVERY DAY   CALCIUM CARBONATE-VITAMIN D PO Take by mouth. [place]  mg-10 mcg, oral, Once A Day   Cholecalciferol (VITAMIN D3 PO) Take 1,000 Units by mouth daily.   donepezil (ARICEPT) 10 MG tablet TAKE 1 TABLET AT BEDTIME   loperamide (IMODIUM A-D) 2 MG tablet Take 2 mg by mouth as needed for diarrhea or loose stools.   loratadine (CLARITIN) 10 MG tablet Take 10 mg by mouth daily.   mirabegron ER (MYRBETRIQ) 25 MG TB24 tablet Take 25 mg by mouth daily.   mirtazapine (REMERON) 15 MG tablet TAKE 1 TABLET (15 MG TOTAL) BY MOUTH AT BEDTIME.   NON FORMULARY Diet-regular   omeprazole (PRILOSEC) 40 MG capsule TAKE 1 CAPSULE EVERY DAY   OXYGEN Inhale 2 L into the lungs continuous.   potassium chloride (KLOR-CON) 20 MEQ packet Take 20 mEq by mouth daily.   STRIVERDI RESPIMAT 2.5 MCG/ACT AERS INHALE 2 PUFFS ONE TIME DAILY AT THE SAME TIME   verapamil (CALAN-SR) 240 MG CR tablet TAKE 1 TABLET AT BEDTIME   No facility-administered encounter medications on file as of 10/05/2021.     SIGNIFICANT DIAGNOSTIC EXAMS   LABS REVIEWED PREVIOUS  01-06-21: wbc 6.1; hgb 11.3; hct 37.1; mcv 88; plt 278; glucose 150; bun 24; creat 1.25; k+ 3.2; na++ 148; ca 8.9; GFR 47; liver normal albumin 3.7; chol 152; ldl 61; trig 125; hdl 69; vit D 63.3; tsh 0.407; free T4; 0.84 03-30-21: wbc 5.5; hgb 8.9; hct 30.2; mcv 92.9 plt 210; glucose 78; bun 23; creat 0.86; k+ 3.5; na++ 143; ca 8.6 GFR>60; liver normal albumin 2.9; chol 141; ldl 65; trig 67; hdl 63 06-02-21: urine culture: no growth; hep C neg 07-21-21: wbc 4.8; hgb 10.0; hct 34.4; mcv 87.3 plt 224; glucose 138; bun  22; creat 0.87; k+ 3.4; na++ 130; ca 8.5; GFR>60 07-27-21: albumin 3.7; k+ 3.9 09-02-21: glucose 84; bun 18; creat 0.77; k+ 3.7; na++ 139; ca 9.0; GFR>60; liver normal albumin 4.0 tsh 1.075 free t4: 0.93   NO NEW LABS.   Review of Systems  Constitutional:  Negative for malaise/fatigue.  Respiratory:  Negative for cough and shortness of breath.   Cardiovascular:  Negative for chest pain, palpitations and leg swelling.  Gastrointestinal:  Negative for abdominal pain, constipation and heartburn.  Musculoskeletal:  Negative for back pain, joint pain and myalgias.  Skin: Negative.   Neurological:  Negative for dizziness.  Psychiatric/Behavioral:  The patient is not nervous/anxious.    Physical Exam Constitutional:      General: She is not in acute distress.    Appearance: She is well-developed. She is not diaphoretic.  Neck:     Thyroid: No thyromegaly.  Cardiovascular:     Rate and Rhythm: Normal rate and regular rhythm.     Pulses: Normal pulses.     Heart sounds: Murmur heard.     Comments: 2/6 Pulmonary:     Effort: Pulmonary effort is normal. No respiratory distress.     Breath sounds: Normal breath sounds.     Comments: 02 Abdominal:     General: Bowel sounds are normal. There is no distension.     Palpations: Abdomen is soft.     Tenderness: There is no abdominal tenderness.  Musculoskeletal:        General: Normal range of motion.     Cervical back: Neck supple.     Right lower leg: No edema.     Left lower leg: No edema.  Lymphadenopathy:     Cervical: No cervical adenopathy.  Skin:    General: Skin is  warm and dry.  Neurological:     Mental Status: She is alert. Mental status is at baseline.  Psychiatric:        Mood and Affect: Mood normal.       ASSESSMENT AND PLAN   TODAY  Esophageal reflux disease without esophagitis: is stable will continue prilosec 40 mg daily  2. Vascular dementia without behavioral disturbance: weight is stable at 163 pounds; will  continue aricept 10 mg daily   3. Generalized osteoarthritis of multiple sites: is stable has tylenol 650 mg every 6 hours  4. Age related osteoporosis without current pathological fracture: is stable will continue fosamax 70 mg weekly   PREVIOUS   5.  Stage 3a chronic kidney disease: is stable bun 18; creat 0.77; GFR >60  6. Normocytic anemia: is stable hgb 8.9 will monitor   7. 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 Low thyroid stimulating hormone (TSH) is stable tsh 1.075 free t4: 0.93 will monitor   9. Psychophysiological insomnia: will continue remeron 15 mg nightly   10. Hyperlipidemia LDL goal <100: ldl 65: will continue lipitor 10 mg daily   11. OAB: stable will continue myrbetriq 25 mg daily   12. Protein calorie malnutrition severe; albumin 4.0 will continue supplements as indicated  13. COPD with hypoxia: is stable 02 dependent will continue striverdi respimat 2.5 mcg 2 puffs daily claritin 10 mg daily albuterol 2 puffs every 6 hours as needed   14. Essential hypertension: is stable b/p 107/65; will continue norvasc 2.5 mg daily calan sr 240 mg daily           Ok Edwards NP [facility]  Contact [phone] Monday through Friday 8am- 5pm  After hours call [phone]

## 2021-10-16 ENCOUNTER — Non-Acute Institutional Stay (SKILLED_NURSING_FACILITY): Payer: Medicare PPO | Admitting: Adult Health

## 2021-10-16 ENCOUNTER — Encounter: Payer: Self-pay | Admitting: Adult Health

## 2021-10-16 DIAGNOSIS — J449 Chronic obstructive pulmonary disease, unspecified: Secondary | ICD-10-CM

## 2021-10-16 DIAGNOSIS — N183 Chronic kidney disease, stage 3 unspecified: Secondary | ICD-10-CM | POA: Diagnosis not present

## 2021-10-16 DIAGNOSIS — R0902 Hypoxemia: Secondary | ICD-10-CM | POA: Diagnosis not present

## 2021-10-16 DIAGNOSIS — I248 Other forms of acute ischemic heart disease: Secondary | ICD-10-CM | POA: Diagnosis not present

## 2021-10-16 DIAGNOSIS — F01C0 Vascular dementia, severe, without behavioral disturbance, psychotic disturbance, mood disturbance, and anxiety: Secondary | ICD-10-CM

## 2021-10-16 DIAGNOSIS — I2489 Other forms of acute ischemic heart disease: Secondary | ICD-10-CM

## 2021-10-16 NOTE — Progress Notes (Signed)
Location:  [place] Room Number: 149-W Place of Service:  SNF (31) Provider: Ok Edwards, NP  CODE STATUS: DNR  Allergies  Allergen Reactions   Fenofibrate Nausea And Vomiting   Pravastatin Sodium Nausea And Vomiting   Sulfonamide Derivatives Other (See Comments)    unknown   Ciprofloxacin Rash    Chief Complaint  Patient presents with   Acute Visit    Care plan meeting.     HPI:  We have come together for her care plan meeting. No BIMS; mood 0/30. She requires limited to extensive assist with her adls. She is frequently incontinent of bladder and bowel. She is nonambulatory and is able to propel self in wheelchair. She has had multiple falls without injury. She is on chronic 02. Dietary: weight is stable at 169 pounds;  on regular diet and is able to feed herself. Therapy none at this time. She will continue to be followed for her chronic illnesses including: Demand ischemia  COPD with hypoxia Severe vascular dementia without behavioral disturbance ' psychotic disturbance; mood disturbance or anxiety Stage 3 chronic kidney disease unspecified 3a or 3b failure   Past Medical History:  Diagnosis Date   Abnormality of gait 06/06/2014   Allergic rhinitis    Anemia    Ankle fracture, right    Anxiety    Arm fracture, left    Arthritis    abnormal gait    COPD (chronic obstructive pulmonary disease) (HCC)    chronic CO2 retention, decreased DLCO    Cystic acne    adult    Degenerative disc disease, cervical    syrinx C3-7   Degenerative disc disease, lumbar    L5 nerve impingement    Dementia (HCC)    Depression    DNR (do not resuscitate)    Fracture of right proximal fibula 07/13/18 09/05/2018   GERD (gastroesophageal reflux disease)    Heme positive stool 10/10/2017   History of colon surgery    right hemicolectomy for high-grade adenomas in right colon 2013   Hyperglycemia    Hypertension    Low back pain    Mediastinal lymphadenopathy  1/9   resolving    Memory difficulty 09/20/2014   mild dementia   Onychomycosis    OSA on CPAP    Sleep apnea    stop bang score 6    Past Surgical History:  Procedure Laterality Date   ABDOMINAL HYSTERECTOMY  1976   secondary to bleeding    APPENDECTOMY     COLON RESECTION  08/18/2012   Procedure: HAND ASSISTED LAPAROSCOPIC COLON RESECTION;  Surgeon: Jamesetta So, MD;  Location: [facility];  Service: General;  Laterality: N/A;   COLONOSCOPY  07/20/2012   Dr. Gala Romney: multiple colonic polyps with large polyps on right side, s/p saline-assisted debulking piecemeal polypectomy and ablation. Not all removed. Path with tubulovillous adenomas, high grade dysplasia.    COLONOSCOPY N/A 03/09/2013   DTO:IZTIWPY polyps-tubular adenomas. S/p right hemicolectomy. next tcs 02/2018   epidural steroids     ESOPHAGOGASTRODUODENOSCOPY (EGD) WITH ESOPHAGEAL DILATION N/A 02/06/2014   Procedure: ESOPHAGOGASTRODUODENOSCOPY (EGD) WITH ESOPHAGEAL DILATION;  Surgeon: Daneil Dolin, MD;  [facility];  Service: Endoscopy;  Laterality: N/A;  9:30   OOPHORECTOMY  1976   ORIF ANKLE FRACTURE  01/18/2012   Procedure: OPEN REDUCTION INTERNAL FIXATION (ORIF) ANKLE FRACTURE;  Surgeon: Arther Abbott, MD;  Location: [facility];  Service: Orthopedics;  Laterality: Left;   ORIF ANKLE FRACTURE Right 01/13/2016  Procedure: OPEN REDUCTION INTERNAL FIXATION (ORIF) ANKLE FRACTURE;  Surgeon: Carole Civil, MD;  Location: [facility];  Service: Orthopedics;  Laterality: Right;    Social History   Socioeconomic History   Marital status: Widowed    Spouse name: Not on file   Number of children: 4   Years of education: college 1   Highest education level: Not on file  Occupational History   Occupation: employed in the tobacco industry     Employer: RETIRED   Occupation: retired  Tobacco Use   Smoking status: Former    Packs/day: 1.50    Years: 40.00    Pack years: 60.00    Types: Cigarettes    Quit date: 06/21/2006     Years since quitting: 15.3   Smokeless tobacco: Never  Vaping Use   Vaping Use: Never used  Substance and Sexual Activity   Alcohol use: No   Drug use: No   Sexual activity: Not Currently  Other Topics Concern   Not on file  Social History Narrative   Long term resident of [facility]    Social Determinants of Health   Financial Resource Strain: Low Risk    Difficulty of Paying Living Expenses: Not hard at all  Food Insecurity: No Food Insecurity   Worried About Charity fundraiser in the Last Year: Never true   [place] in the Last Year: Never true  Transportation Needs: No Transportation Needs   Lack of Transportation (Medical): No   Lack of Transportation (Non-Medical): No  Physical Activity: Inactive   Days of Exercise per Week: 0 days   Minutes of Exercise per Session: 0 min  Stress: No Stress Concern Present   Feeling of Stress : Not at all  Social Connections: Socially Isolated   Frequency of Communication with Friends and Family: Three times a week   Frequency of Social Gatherings with Friends and Family: Once a week   Attends Religious Services: Never   Marine scientist or Organizations: No   Attends Archivist Meetings: Never   Marital Status: Widowed  Human resources officer Violence: Not At Risk   Fear of Current or Ex-Partner: No   Emotionally Abused: No   Physically Abused: No   Sexually Abused: No   Family History  Problem Relation Age of Onset   Stomach cancer Father    Cancer Father    Cancer Mother        brain    Breast cancer Mother    Arthritis Other       VITAL SIGNS BP 137/82   Pulse 83   Temp 98.2 F (36.8 C)   Resp (!) 21   Ht 6\' 1"  (1.854 m)   Wt 169 lb (76.7 kg)   SpO2 98%   BMI 22.30 kg/m   Outpatient Encounter Medications as of 10/16/2021  Medication Sig   acetaminophen (TYLENOL) 325 MG tablet Take 650 mg by mouth every 8 (eight) hours as needed.   albuterol (VENTOLIN HFA) 108 (90 Base) MCG/ACT inhaler Inhale  2 puffs into the lungs every 6 (six) hours as needed for wheezing or shortness of breath.   alendronate (FOSAMAX) 70 MG tablet Take 70 mg by mouth once a week. Take on an empty stomach at least 30 mns prior to food. Sit upright x 54mns after taking meds.   amLODipine (NORVASC) 2.5 MG tablet Take 1 tablet (2.5 mg total) by mouth daily.   atorvastatin (LIPITOR) 10 MG tablet TAKE 1  TABLET EVERY DAY   CALCIUM CARBONATE-VITAMIN D PO Take by mouth. 600 mg-10 mcg, oral, Once A Day   Cholecalciferol (VITAMIN D3 PO) Take 1,000 Units by mouth daily.   donepezil (ARICEPT) 10 MG tablet TAKE 1 TABLET AT BEDTIME   loperamide (IMODIUM A-D) 2 MG tablet Take 2 mg by mouth as needed for diarrhea or loose stools.   loratadine (CLARITIN) 10 MG tablet Take 10 mg by mouth daily.   mirabegron ER (MYRBETRIQ) 50 MG TB24 tablet Take 50 mg by mouth every evening.   mirtazapine (REMERON) 7.5 MG tablet Take 7.5 mg by mouth at bedtime.   NON FORMULARY Diet-regular   omeprazole (PRILOSEC) 40 MG capsule TAKE 1 CAPSULE EVERY DAY   OXYGEN Inhale 2 L into the lungs continuous.   potassium chloride (KLOR-CON) 20 MEQ packet Take 20 mEq by mouth daily.   STRIVERDI RESPIMAT 2.5 MCG/ACT AERS INHALE 2 PUFFS ONE TIME DAILY AT THE SAME TIME   verapamil (CALAN-SR) 240 MG CR tablet TAKE 1 TABLET AT BEDTIME   [DISCONTINUED] mirabegron ER (MYRBETRIQ) 25 MG TB24 tablet Take 25 mg by mouth daily.   [DISCONTINUED] mirtazapine (REMERON) 15 MG tablet TAKE 1 TABLET (15 MG TOTAL) BY MOUTH AT BEDTIME.   No facility-administered encounter medications on file as of 10/16/2021.     SIGNIFICANT DIAGNOSTIC EXAMS   LABS REVIEWED PREVIOUS  01-06-21: wbc 6.1; hgb 11.3; hct 37.1; mcv 88; plt 278; glucose 150; bun 24; creat 1.25; k+ 3.2; na++ 148; ca 8.9; GFR 47; liver normal albumin 3.7; chol 152; ldl 61; trig 125; hdl 69; vit D 63.3; tsh 0.407; free T4; 0.84 03-30-21: wbc 5.5; hgb 8.9; hct 30.2; mcv 92.9 plt 210; glucose 78; bun 23; creat 0.86; k+  3.5; na++ 143; ca 8.6 GFR>60; liver normal albumin 2.9; chol 141; ldl 65; trig 67; hdl 63 06-02-21: urine culture: no growth; hep C neg 07-21-21: wbc 4.8; hgb 10.0; hct 34.4; mcv 87.3 plt 224; glucose 138; bun 22; creat 0.87; k+ 3.4; na++ 130; ca 8.5; GFR>60 07-27-21: albumin 3.7; k+ 3.9 09-02-21: glucose 84; bun 18; creat 0.77; k+ 3.7; na++ 139; ca 9.0; GFR>60; liver normal albumin 4.0 tsh 1.075 free t4: 0.93   NO NEW LABS.   Review of Systems  Constitutional:  Negative for malaise/fatigue.  Respiratory:  Negative for cough and shortness of breath.   Cardiovascular:  Negative for chest pain, palpitations and leg swelling.  Gastrointestinal:  Negative for abdominal pain, constipation and heartburn.  Musculoskeletal:  Negative for back pain, joint pain and myalgias.  Skin: Negative.   Neurological:  Negative for dizziness.  Psychiatric/Behavioral:  The patient is not nervous/anxious.    Physical Exam Constitutional:      General: She is not in acute distress.    Appearance: She is well-developed. She is not diaphoretic.  Neck:     Thyroid: No thyromegaly.  Cardiovascular:     Rate and Rhythm: Normal rate and regular rhythm.     Pulses: Normal pulses.     Heart sounds: Murmur heard.     Comments: 2/6 Pulmonary:     Effort: Pulmonary effort is normal. No respiratory distress.     Breath sounds: Normal breath sounds.     Comments: 02 Abdominal:     General: Bowel sounds are normal. There is no distension.     Palpations: Abdomen is soft.     Tenderness: There is no abdominal tenderness.  Musculoskeletal:        General: Normal range of  motion.     Cervical back: Neck supple.     Right lower leg: No edema.     Left lower leg: No edema.  Lymphadenopathy:     Cervical: No cervical adenopathy.  Skin:    General: Skin is warm and dry.  Neurological:     Mental Status: She is alert. Mental status is at baseline.  Psychiatric:        Mood and Affect: Mood normal.     ASSESSMENT/  PLAN:  TODAY  Demand ischemia COPD with hypoxia Severe vascular dementia without behavioral disturbance ' psychotic disturbance; mood disturbance or anxiety  Stage 3 chronic kidney disease unspecified 3a or 3b failure  Will continue current medications Will continue current plan of care Will continue to monitor her status.    Time spent with patient 40 minutes: medications; plan of care.    Ok Edwards NP [facility]  Contact [phone] Monday through Friday 8am- 5pm  After hours call [phone]

## 2021-10-21 DIAGNOSIS — H2513 Age-related nuclear cataract, bilateral: Secondary | ICD-10-CM | POA: Diagnosis not present

## 2021-10-21 DIAGNOSIS — H524 Presbyopia: Secondary | ICD-10-CM | POA: Diagnosis not present

## 2021-10-27 DIAGNOSIS — L602 Onychogryphosis: Secondary | ICD-10-CM | POA: Diagnosis not present

## 2021-10-27 DIAGNOSIS — I739 Peripheral vascular disease, unspecified: Secondary | ICD-10-CM | POA: Diagnosis not present

## 2021-10-27 DIAGNOSIS — L603 Nail dystrophy: Secondary | ICD-10-CM | POA: Diagnosis not present

## 2021-10-29 ENCOUNTER — Non-Acute Institutional Stay (SKILLED_NURSING_FACILITY): Payer: Medicare PPO | Admitting: Internal Medicine

## 2021-10-29 ENCOUNTER — Encounter: Payer: Self-pay | Admitting: Internal Medicine

## 2021-10-29 DIAGNOSIS — I10 Essential (primary) hypertension: Secondary | ICD-10-CM | POA: Diagnosis not present

## 2021-10-29 DIAGNOSIS — D649 Anemia, unspecified: Secondary | ICD-10-CM

## 2021-10-29 DIAGNOSIS — N1831 Chronic kidney disease, stage 3a: Secondary | ICD-10-CM

## 2021-10-29 DIAGNOSIS — F01C0 Vascular dementia, severe, without behavioral disturbance, psychotic disturbance, mood disturbance, and anxiety: Secondary | ICD-10-CM

## 2021-10-29 NOTE — Assessment & Plan Note (Signed)
CKD stable w creatinine 0.77 & GFR 47. No med change indicated.

## 2021-10-29 NOTE — Assessment & Plan Note (Addendum)
Today she could not give the date, even the year.  She asked "is it 33"? She could not name any president.  Staff does not report behavioral issues.

## 2021-10-29 NOTE — Assessment & Plan Note (Addendum)
Serially anemia significantly improved. [place] NP will monitor CBC. Staff reports no bleeding dyscrasias

## 2021-10-29 NOTE — Patient Instructions (Signed)
See assessment and plan under each diagnosis in the problem list and acutely for this visit 

## 2021-10-29 NOTE — Progress Notes (Signed)
° °  NURSING HOME LOCATION:  [facility] ROOM NUMBER:  [id number] W  CODE STATUS:  DNR  PCP:  Ok Edwards NP  This is a nursing facility follow up visit of chronic medical diagnoses & to document compliance with Regulation 483.30 (c) in [employer] Manual Phase 2 which mandates caregiver visit ( visits can alternate among physician, PA or NP as per statutes) within 10 days of 30 days / 60 days/ 90 days post admission to SNF date    Interim medical record and care since last SNF visit was updated with review of diagnostic studies and change in clinical status since last visit were documented.  HPI: She is a permanent resident of the facility with medical diagnoses of oxygen dependent COPD, extensive degenerative disc disease, chronic depression, GERD, essential hypertension, and OSA on CPAP. Surgeries and procedures include TAH, colon resection, and EGD.  Current labs reveal CKD Stage II w creatinine of 0.77 & GFR > 60.In Feb GFR was 41 , indicating Stage 3B. LDL of 65 is @ goal. On 03/30/21 H/H 8.9/30.2 but it improved to 10/34.4 as of 07/21/21. TSH is therapeutic.  Review of systems: Dementia invalidated responses.  She cannot provide the date, not even the year.  She asked "is it 28"?  She could not name any president.  The review of systems was negative except she seemed to indicate she had intermittent paroxysmal nocturnal dyspnea.  The validity of this is questionable in view of the dementia.  Even though all responses were otherwise negative; when asked questions there was a long pause before she responded.  Physical exam:  Pertinent or positive findings: She appears her stated age.  Hair is disheveled.  She has bilateral ptosis.  She is wearing nasal O2.  She has complete dentures.  First and second heart sounds are accentuated.  Breath sounds are decreased.  She has trace edema at the sock line.  Pedal pulses are decreased.  Strength to opposition is fair in all  extremities and equal.  General appearance: Adequately nourished; no acute distress, increased work of breathing is present.   Lymphatic: No lymphadenopathy about the head, neck, axilla. Eyes: No conjunctival inflammation or lid edema is present. There is no scleral icterus. Ears:  External ear exam shows no significant lesions or deformities.   Nose:  External nasal examination shows no deformity or inflammation. Nasal mucosa are pink and moist without lesions, exudates Neck:  No thyromegaly, masses, tenderness noted.    Heart:  No gallop, murmur, click, rub .  Lungs:  without wheezes, rhonchi, rales, rubs. Abdomen: Bowel sounds are normal. Abdomen is soft and nontender with no organomegaly, hernias, masses. GU: Deferred  Extremities:  No cyanosis, significant clubbing Neurologic exam :Balance, Rhomberg, finger to nose testing could not be completed due to clinical state Skin: Warm & dry w/o tenting. No significant lesions or rash.  See summary under each active problem in the Problem List with associated updated therapeutic plan

## 2021-10-29 NOTE — Assessment & Plan Note (Signed)
Serial BPs reviewed ; vast majority < 437 systolic with values as low as 104. Today's BP is outlier Continue monitor, antihypertensive therapy based on average, not extremes

## 2021-11-19 DIAGNOSIS — J9611 Chronic respiratory failure with hypoxia: Secondary | ICD-10-CM | POA: Diagnosis not present

## 2021-11-19 DIAGNOSIS — Z1159 Encounter for screening for other viral diseases: Secondary | ICD-10-CM | POA: Diagnosis not present

## 2021-11-24 ENCOUNTER — Other Ambulatory Visit (HOSPITAL_COMMUNITY)
Admission: RE | Admit: 2021-11-24 | Discharge: 2021-11-24 | Disposition: A | Discharge status: Home / Self Care | Payer: Medicare PPO | Source: Skilled Nursing Facility | Attending: Adult Health | Admitting: Adult Health

## 2021-11-24 DIAGNOSIS — I10 Essential (primary) hypertension: Secondary | ICD-10-CM | POA: Diagnosis not present

## 2021-11-24 LAB — CBC WITH DIFFERENTIAL/PLATELET
Abs Immature Granulocytes: 0.01 10*3/uL (ref 0.00–0.07)
Basophils Absolute: 0 10*3/uL (ref 0.0–0.1)
Basophils Relative: 1 %
Eosinophils Absolute: 0.2 10*3/uL (ref 0.0–0.5)
Eosinophils Relative: 5 %
HCT: 35.7 % — ABNORMAL LOW (ref 36.0–46.0)
Hemoglobin: 10.9 g/dL — ABNORMAL LOW (ref 12.0–15.0)
Immature Granulocytes: 0 %
Lymphocytes Relative: 22 %
Lymphs Abs: 0.7 10*3/uL (ref 0.7–4.0)
MCH: 27.3 pg (ref 26.0–34.0)
MCHC: 30.5 g/dL (ref 30.0–36.0)
MCV: 89.5 fL (ref 80.0–100.0)
Monocytes Absolute: 0.3 10*3/uL (ref 0.1–1.0)
Monocytes Relative: 10 %
Neutro Abs: 2 10*3/uL (ref 1.7–7.7)
Neutrophils Relative %: 62 %
Platelets: 191 10*3/uL (ref 150–400)
RBC: 3.99 MIL/uL (ref 3.87–5.11)
RDW: 15.5 % (ref 11.5–15.5)
WBC: 3.2 10*3/uL — ABNORMAL LOW (ref 4.0–10.5)
nRBC: 0 % (ref 0.0–0.2)

## 2021-11-24 LAB — BASIC METABOLIC PANEL
Anion gap: 5 (ref 5–15)
BUN: 26 mg/dL — ABNORMAL HIGH (ref 8–23)
CO2: 27 mmol/L (ref 22–32)
Calcium: 8.8 mg/dL — ABNORMAL LOW (ref 8.9–10.3)
Chloride: 107 mmol/L (ref 98–111)
Creatinine, Ser: 0.87 mg/dL (ref 0.44–1.00)
GFR, Estimated: >60 mL/min (ref >60)
Glucose, Bld: 95 mg/dL (ref 70–99)
Potassium: 4.2 mmol/L (ref 3.5–5.1)
Sodium: 139 mmol/L (ref 135–145)

## 2021-11-26 DIAGNOSIS — J9611 Chronic respiratory failure with hypoxia: Secondary | ICD-10-CM | POA: Diagnosis not present

## 2021-11-26 DIAGNOSIS — Z1159 Encounter for screening for other viral diseases: Secondary | ICD-10-CM | POA: Diagnosis not present

## 2021-12-03 ENCOUNTER — Other Ambulatory Visit (HOSPITAL_COMMUNITY)
Admission: RE | Admit: 2021-12-03 | Discharge: 2021-12-03 | Disposition: A | Discharge status: Home / Self Care | Payer: Medicare PPO | Source: Skilled Nursing Facility | Attending: Adult Health | Admitting: Adult Health

## 2021-12-03 ENCOUNTER — Encounter: Payer: Medicare PPO | Admitting: Family Medicine

## 2021-12-03 DIAGNOSIS — N1831 Chronic kidney disease, stage 3a: Secondary | ICD-10-CM | POA: Diagnosis not present

## 2021-12-03 DIAGNOSIS — Z1159 Encounter for screening for other viral diseases: Secondary | ICD-10-CM | POA: Diagnosis not present

## 2021-12-03 DIAGNOSIS — J9611 Chronic respiratory failure with hypoxia: Secondary | ICD-10-CM | POA: Diagnosis not present

## 2021-12-03 LAB — CBC
HCT: 33.9 % — ABNORMAL LOW (ref 36.0–46.0)
Hemoglobin: 10.3 g/dL — ABNORMAL LOW (ref 12.0–15.0)
MCH: 26.8 pg (ref 26.0–34.0)
MCHC: 30.4 g/dL (ref 30.0–36.0)
MCV: 88.3 fL (ref 80.0–100.0)
Platelets: 220 10*3/uL (ref 150–400)
RBC: 3.84 MIL/uL — ABNORMAL LOW (ref 3.87–5.11)
RDW: 15.2 % (ref 11.5–15.5)
WBC: 3.6 10*3/uL — ABNORMAL LOW (ref 4.0–10.5)
nRBC: 0 % (ref 0.0–0.2)

## 2021-12-03 LAB — COMPREHENSIVE METABOLIC PANEL
ALT: 14 U/L (ref 0–44)
AST: 14 U/L — ABNORMAL LOW (ref 15–41)
Albumin: 3.6 g/dL (ref 3.5–5.0)
Alkaline Phosphatase: 59 U/L (ref 38–126)
Anion gap: 7 (ref 5–15)
BUN: 19 mg/dL (ref 8–23)
CO2: 28 mmol/L (ref 22–32)
Calcium: 8.9 mg/dL (ref 8.9–10.3)
Chloride: 105 mmol/L (ref 98–111)
Creatinine, Ser: 0.92 mg/dL (ref 0.44–1.00)
GFR, Estimated: >60 mL/min (ref >60)
Glucose, Bld: 87 mg/dL (ref 70–99)
Potassium: 4.1 mmol/L (ref 3.5–5.1)
Sodium: 140 mmol/L (ref 135–145)
Total Bilirubin: 0.5 mg/dL (ref 0.3–1.2)
Total Protein: 6.1 g/dL — ABNORMAL LOW (ref 6.5–8.1)

## 2021-12-04 ENCOUNTER — Non-Acute Institutional Stay (SKILLED_NURSING_FACILITY): Payer: Medicare PPO | Admitting: Adult Health

## 2021-12-04 ENCOUNTER — Encounter: Payer: Self-pay | Admitting: Adult Health

## 2021-12-04 DIAGNOSIS — N1831 Chronic kidney disease, stage 3a: Secondary | ICD-10-CM

## 2021-12-04 DIAGNOSIS — D649 Anemia, unspecified: Secondary | ICD-10-CM | POA: Diagnosis not present

## 2021-12-04 DIAGNOSIS — F324 Major depressive disorder, single episode, in partial remission: Secondary | ICD-10-CM | POA: Diagnosis not present

## 2021-12-04 DIAGNOSIS — R7989 Other specified abnormal findings of blood chemistry: Secondary | ICD-10-CM

## 2021-12-04 NOTE — Progress Notes (Signed)
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 There are no reports of uncontrolled pain. No reports of insomnia; no reports of excessive fatigue   Past Medical History:  Diagnosis Date   Abnormality of gait 06/06/2014   Allergic rhinitis    Anemia    Ankle fracture, right    Anxiety    Arm fracture, left    Arthritis    abnormal gait    COPD (chronic obstructive pulmonary disease) (HCC)    chronic CO2 retention, decreased DLCO    Cystic acne    adult    Degenerative disc disease, cervical    syrinx C3-7   Degenerative disc disease, lumbar    L5 nerve impingement    Dementia (HCC)    Depression    DNR (do not resuscitate)    Fracture of right proximal fibula 07/13/18 09/05/2018   GERD (gastroesophageal reflux disease)    Heme positive stool 10/10/2017   History of colon surgery    right hemicolectomy for high-grade adenomas in right colon 2013   Hyperglycemia    Hypertension    Low back pain    Mediastinal lymphadenopathy 1/9   resolving    Memory difficulty 09/20/2014   mild dementia   Onychomycosis    OSA on CPAP    Sleep apnea    stop bang score 6    Past Surgical History:  Procedure  Laterality Date   ABDOMINAL HYSTERECTOMY  1976   secondary to bleeding    APPENDECTOMY     COLON RESECTION  08/18/2012   Procedure: HAND ASSISTED LAPAROSCOPIC COLON RESECTION;  Surgeon: Jamesetta So, MD;  Location: [facility];  Service: General;  Laterality: N/A;   COLONOSCOPY  07/20/2012   Dr. Gala Romney: multiple colonic polyps with large polyps on right side, s/p saline-assisted debulking piecemeal polypectomy and ablation. Not all removed. Path with tubulovillous adenomas, high grade dysplasia.    COLONOSCOPY N/A 03/09/2013   GYJ:EHUDJSH polyps-tubular adenomas. S/p right hemicolectomy. next tcs 02/2018   epidural steroids     ESOPHAGOGASTRODUODENOSCOPY (EGD) WITH ESOPHAGEAL DILATION N/A 02/06/2014   Procedure: ESOPHAGOGASTRODUODENOSCOPY (EGD) WITH ESOPHAGEAL DILATION;  Surgeon: Daneil Dolin, MD;  Location: [facility];  Service: Endoscopy;  Laterality: N/A;  9:30   OOPHORECTOMY  1976   ORIF ANKLE FRACTURE  01/18/2012   Procedure: OPEN REDUCTION INTERNAL FIXATION (ORIF) ANKLE FRACTURE;  Surgeon: Arther Abbott, MD;  Location: [facility];  Service: Orthopedics;  Laterality: Left;   ORIF ANKLE FRACTURE Right 01/13/2016   Procedure: OPEN REDUCTION INTERNAL FIXATION (ORIF) ANKLE FRACTURE;  Surgeon: Carole Civil, MD;  Location: [facility];  Service: Orthopedics;  Laterality: Right;    Social  History   Socioeconomic History   Marital status: Widowed    Spouse name: Not on file   Number of children: 4   Years of education: college 1   Highest education level: Not on file  Occupational History   Occupation: employed in the tobacco industry     Employer: RETIRED   Occupation: retired  Tobacco Use   Smoking status: Former    Packs/day: 1.50    Years: 40.00    Pack years: 60.00    Types: Cigarettes    Quit date: 06/21/2006    Years since quitting: 15.4   Smokeless tobacco: Never  Vaping Use   Vaping Use: Never used  Substance and Sexual Activity   Alcohol use: No   Drug use: No   Sexual  activity: Not Currently  Other Topics Concern   Not on file  Social History Narrative   Long term resident of [facility]    Social Determinants of Health   Financial Resource Strain: Not on file  Food Insecurity: Not on file  Transportation Needs: Not on file  Physical Activity: Not on file  Stress: Not on file  Social Connections: Not on file  Intimate Partner Violence: Not on file   Family History  Problem Relation Age of Onset   Stomach cancer Father    Cancer Father    Cancer Mother        brain    Breast cancer Mother    Arthritis Other       VITAL SIGNS BP 124/78    Pulse 86    Temp 98.6 F (37 C)    Resp 20    Ht 6\' 1"  (1.854 m)    Wt 169 lb (76.7 kg)    SpO2 100%    BMI 22.30 kg/m   Outpatient Encounter Medications as of 12/04/2021  Medication Sig   acetaminophen (TYLENOL) 325 MG tablet Take 650 mg by mouth every 8 (eight) hours as needed.   albuterol (VENTOLIN HFA) 108 (90 Base) MCG/ACT inhaler Inhale 2 puffs into the lungs every 6 (six) hours as needed for wheezing or shortness of breath.   alendronate (FOSAMAX) 70 MG tablet Take 70 mg by mouth once a week. Take on an empty stomach at least 30 mns prior to food. Sit upright x 30mns after taking meds.   amLODipine (NORVASC) 2.5 MG tablet Take 1 tablet (2.5 mg total) by mouth daily.   atorvastatin (LIPITOR) 10 MG tablet TAKE 1 TABLET EVERY DAY   CALCIUM CARBONATE-VITAMIN D PO Take by mouth. 600 mg-10 mcg, oral, Once A Day   Cholecalciferol (VITAMIN D3 PO) Take 1,000 Units by mouth daily.   donepezil (ARICEPT) 10 MG tablet TAKE 1 TABLET AT BEDTIME   loperamide (IMODIUM A-D) 2 MG tablet Take 2 mg by mouth as needed for diarrhea or loose stools.   loratadine (CLARITIN) 10 MG tablet Take 10 mg by mouth daily.   mirabegron ER (MYRBETRIQ) 50 MG TB24 tablet Take 50 mg by mouth every evening.   NON FORMULARY Diet-regular   omeprazole (PRILOSEC) 40 MG capsule TAKE 1 CAPSULE EVERY DAY   OXYGEN Inhale 2 L into the lungs  continuous.   potassium chloride (KLOR-CON) 20 MEQ packet Take 20 mEq by mouth daily.   STRIVERDI RESPIMAT 2.5 MCG/ACT AERS INHALE 2 PUFFS ONE TIME DAILY AT THE SAME TIME   verapamil (CALAN-SR) 240 MG CR tablet TAKE 1 TABLET AT BEDTIME   No facility-administered encounter medications on file as of 12/04/2021.  SIGNIFICANT DIAGNOSTIC EXAMS  LABS REVIEWED PREVIOUS  01-06-21: wbc 6.1; hgb 11.3; hct 37.1; mcv 88; plt 278; glucose 150; bun 24; creat 1.25; k+ 3.2; na++ 148; ca 8.9; GFR 47; liver normal albumin 3.7; chol 152; ldl 61; trig 125; hdl 69; vit D 63.3; tsh 0.407; free T4; 0.84 03-30-21: wbc 5.5; hgb 8.9; hct 30.2; mcv 92.9 plt 210; glucose 78; bun 23; creat 0.86; k+ 3.5; na++ 143; ca 8.6 GFR>60; liver normal albumin 2.9; chol 141; ldl 65; trig 67; hdl 63 06-02-21: urine culture: no growth; hep C neg 07-21-21: wbc 4.8; hgb 10.0; hct 34.4; mcv 87.3 plt 224; glucose 138; bun 22; creat 0.87; k+ 3.4; na++ 130; ca 8.5; GFR>60 07-27-21: albumin 3.7; k+ 3.9 09-02-21: glucose 84; bun 18; creat 0.77; k+ 3.7; na++ 139; ca 9.0; GFR>60; liver normal albumin 4.0 tsh 1.075 free t4: 0.93   NO NEW LABS.    Review of Systems  Constitutional:  Negative for malaise/fatigue.  Respiratory:  Negative for cough and shortness of breath.   Cardiovascular:  Negative for chest pain, palpitations and leg swelling.  Gastrointestinal:  Negative for abdominal pain, constipation and heartburn.  Musculoskeletal:  Negative for back pain, joint pain and myalgias.  Skin: Negative.   Neurological:  Negative for dizziness.  Psychiatric/Behavioral:  The patient is not nervous/anxious.    Physical Exam Constitutional:      General: She is not in acute distress.    Appearance: She is well-developed. She is not diaphoretic.  Neck:     Thyroid: No thyromegaly.  Cardiovascular:     Rate and Rhythm: Normal rate and regular rhythm.     Pulses: Normal pulses.     Heart sounds: Murmur heard.     Comments: 2/6 Pulmonary:      Effort: Pulmonary effort is normal. No respiratory distress.     Breath sounds: Normal breath sounds.     Comments: 02 Abdominal:     General: Bowel sounds are normal. There is no distension.     Palpations: Abdomen is soft.     Tenderness: There is no abdominal tenderness.  Musculoskeletal:        General: Normal range of motion.     Cervical back: Neck supple.     Right lower leg: No edema.     Left lower leg: No edema.  Lymphadenopathy:     Cervical: No cervical adenopathy.  Skin:    General: Skin is warm and dry.  Neurological:     Mental Status: She is alert. Mental status is at baseline.  Psychiatric:        Mood and Affect: Mood normal.       ASSESSMENT AND PLAN   TODAY  Stage 3a chronic kidney disease: is stable bun 18 creat 0.77 GFR>60  2. Normocytic anemia is stable hgb 8.9 will monitor   3. 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 Low thyroid stimulatin hormone (TSH) is stable tsh 1.075 free t4: 0.93 will monitor   PREVIOUS   5. Psychophysiological insomnia: will continue to monitor   6. Hyperlipidemia LDL goal <100: ldl 65: will continue lipitor 10 mg daily   7. OAB: stable will continue myrbetriq 50 mg daily   8. Protein calorie malnutrition severe; albumin 4.0 will continue supplements as indicated  9. COPD with hypoxia: is stable 02 dependent will continue striverdi respimat 2.5 mcg 2 puffs daily claritin 10 mg daily albuterol 2 puffs every 6 hours  as needed   10. Essential hypertension: is stable b/p 124/78; will continue norvasc 2.5 mg daily calan sr 240 mg daily    11. Esophageal reflux disease without esophagitis: is stable will continue prilosec 40 mg daily  12. Vascular dementia without behavioral disturbance: weight is stable at 169 pounds; will continue aricept 10 mg daily   13. Generalized osteoarthritis of multiple sites: is stable has tylenol 650 mg every 6 hours  14.  Age related osteoporosis without current pathological fracture: is stable will continue fosamax 70 mg weekly          Ok Edwards NP [facility]   call [phone]

## 2021-12-10 DIAGNOSIS — J9611 Chronic respiratory failure with hypoxia: Secondary | ICD-10-CM | POA: Diagnosis not present

## 2021-12-10 DIAGNOSIS — Z1159 Encounter for screening for other viral diseases: Secondary | ICD-10-CM | POA: Diagnosis not present

## 2021-12-17 DIAGNOSIS — Z1159 Encounter for screening for other viral diseases: Secondary | ICD-10-CM | POA: Diagnosis not present

## 2021-12-17 DIAGNOSIS — J9611 Chronic respiratory failure with hypoxia: Secondary | ICD-10-CM | POA: Diagnosis not present

## 2021-12-24 DIAGNOSIS — J9611 Chronic respiratory failure with hypoxia: Secondary | ICD-10-CM | POA: Diagnosis not present

## 2021-12-24 DIAGNOSIS — Z1159 Encounter for screening for other viral diseases: Secondary | ICD-10-CM | POA: Diagnosis not present

## 2021-12-31 DIAGNOSIS — J9611 Chronic respiratory failure with hypoxia: Secondary | ICD-10-CM | POA: Diagnosis not present

## 2021-12-31 DIAGNOSIS — Z1159 Encounter for screening for other viral diseases: Secondary | ICD-10-CM | POA: Diagnosis not present

## 2022-01-01 ENCOUNTER — Encounter: Payer: Self-pay | Admitting: Adult Health

## 2022-01-01 ENCOUNTER — Non-Acute Institutional Stay (SKILLED_NURSING_FACILITY): Payer: Medicare PPO | Admitting: Adult Health

## 2022-01-01 DIAGNOSIS — R0902 Hypoxemia: Secondary | ICD-10-CM | POA: Diagnosis not present

## 2022-01-01 DIAGNOSIS — F01C0 Vascular dementia, severe, without behavioral disturbance, psychotic disturbance, mood disturbance, and anxiety: Secondary | ICD-10-CM

## 2022-01-01 DIAGNOSIS — J449 Chronic obstructive pulmonary disease, unspecified: Secondary | ICD-10-CM | POA: Diagnosis not present

## 2022-01-01 DIAGNOSIS — N1831 Chronic kidney disease, stage 3a: Secondary | ICD-10-CM | POA: Diagnosis not present

## 2022-01-01 NOTE — Progress Notes (Signed)
Location:  [facility] Room Number: 149-W Place of Service:  SNF (31)   CODE STATUS: DNR  Allergies  Allergen Reactions   Fenofibrate Nausea And Vomiting   Pravastatin Sodium Nausea And Vomiting   Sulfonamide Derivatives Other (See Comments)    unknown   Ciprofloxacin Rash    Chief Complaint  Patient presents with   Acute Visit    Care plan meeting    HPI:  We have come together for her care plan meeting. No BIMS; mood 0/30. She requires limited to extensive assist with her adls. She is frequently incontinent of bladder and bowel. She is nonambulatory and propels self throughout facility. She has had 6 falls without injury. Dietary: weight is 167.4 pounds feeds self. Regular diet; good appetite. Therapy: none at this time. She continues to be followed for her chronic illnesses including: COPD with hypoxia   Severe dementia without behavioral disturbance; without psychotic disturbance; without mood disturbance and anxiety.  Stage 3a chronic kidney disease  Past Medical History:  Diagnosis Date   Abnormality of gait 06/06/2014   Allergic rhinitis    Anemia    Ankle fracture, right    Anxiety    Arm fracture, left    Arthritis    abnormal gait    COPD (chronic obstructive pulmonary disease) (HCC)    chronic CO2 retention, decreased DLCO    Cystic acne    adult    Degenerative disc disease, cervical    syrinx C3-7   Degenerative disc disease, lumbar    L5 nerve impingement    Dementia (HCC)    Depression    DNR (do not resuscitate)    Fracture of right proximal fibula 07/13/18 09/05/2018   GERD (gastroesophageal reflux disease)    Heme positive stool 10/10/2017   History of colon surgery    right hemicolectomy for high-grade adenomas in right colon 2013   Hyperglycemia    Hypertension    Low back pain    Mediastinal lymphadenopathy 1/9   resolving    Memory difficulty 09/20/2014   mild dementia   Onychomycosis    OSA on CPAP    Sleep apnea     stop bang score 6    Past Surgical History:  Procedure Laterality Date   ABDOMINAL HYSTERECTOMY  1976   secondary to bleeding    APPENDECTOMY     COLON RESECTION  08/18/2012   Procedure: HAND ASSISTED LAPAROSCOPIC COLON RESECTION;  Surgeon: Jamesetta So, MD;  Location: [facility];  Service: General;  Laterality: N/A;   COLONOSCOPY  07/20/2012   Dr. Gala Romney: multiple colonic polyps with large polyps on right side, s/p saline-assisted debulking piecemeal polypectomy and ablation. Not all removed. Path with tubulovillous adenomas, high grade dysplasia.    COLONOSCOPY N/A 03/09/2013   FVC:BSWHQPR polyps-tubular adenomas. S/p right hemicolectomy. next tcs 02/2018   epidural steroids     ESOPHAGOGASTRODUODENOSCOPY (EGD) WITH ESOPHAGEAL DILATION N/A 02/06/2014   Procedure: ESOPHAGOGASTRODUODENOSCOPY (EGD) WITH ESOPHAGEAL DILATION;  Surgeon: Daneil Dolin, MD;  [facility];  Service: Endoscopy;  Laterality: N/A;  9:30   OOPHORECTOMY  1976   ORIF ANKLE FRACTURE  01/18/2012   Procedure: OPEN REDUCTION INTERNAL FIXATION (ORIF) ANKLE FRACTURE;  Surgeon: Arther Abbott, MD;  Location: [facility];  Service: Orthopedics;  Laterality: Left;   ORIF ANKLE FRACTURE Right 01/13/2016   Procedure: OPEN REDUCTION INTERNAL FIXATION (ORIF) ANKLE FRACTURE;  Surgeon: Carole Civil, MD;  Location: [facility];  Service: Orthopedics;  Laterality:  Right;    Social History   Socioeconomic History   Marital status: Widowed    Spouse name: Not on file   Number of children: 4   Years of education: college 1   Highest education level: Not on file  Occupational History   Occupation: employed in the tobacco industry     Employer: RETIRED   Occupation: retired  Tobacco Use   Smoking status: Former    Packs/day: 1.50    Years: 40.00    Pack years: 60.00    Types: Cigarettes    Quit date: 06/21/2006    Years since quitting: 15.5   Smokeless tobacco: Never  Vaping Use   Vaping Use: Never used  Substance and  Sexual Activity   Alcohol use: No   Drug use: No   Sexual activity: Not Currently  Other Topics Concern   Not on file  Social History Narrative   Long term resident of [facility]    Social Determinants of Health   Financial Resource Strain: Not on file  Food Insecurity: Not on file  Transportation Needs: Not on file  Physical Activity: Not on file  Stress: Not on file  Social Connections: Not on file  Intimate Partner Violence: Not on file   Family History  Problem Relation Age of Onset   Stomach cancer Father    Cancer Father    Cancer Mother        brain    Breast cancer Mother    Arthritis Other       VITAL SIGNS BP 104/61    Pulse 85    Temp 98.6 F (37 C)    Resp 20    Ht 6\' 1"  (1.854 m)    Wt 167 lb 6.4 oz (75.9 kg)    SpO2 95%    BMI 22.09 kg/m   Outpatient Encounter Medications as of 01/01/2022  Medication Sig   acetaminophen (TYLENOL) 325 MG tablet Take 650 mg by mouth every 8 (eight) hours as needed.   albuterol (VENTOLIN HFA) 108 (90 Base) MCG/ACT inhaler Inhale 2 puffs into the lungs every 6 (six) hours as needed for wheezing or shortness of breath.   alendronate (FOSAMAX) 70 MG tablet Take 70 mg by mouth once a week. Take on an empty stomach at least 30 mns prior to food. Sit upright x 72mns after taking meds.   amLODipine (NORVASC) 2.5 MG tablet Take 1 tablet (2.5 mg total) by mouth daily.   atorvastatin (LIPITOR) 10 MG tablet TAKE 1 TABLET EVERY DAY   CALCIUM CARBONATE-VITAMIN D PO Take by mouth. 600 mg-10 mcg, oral, Once A Day   Cholecalciferol (VITAMIN D3 PO) Take 1,000 Units by mouth daily.   donepezil (ARICEPT) 10 MG tablet TAKE 1 TABLET AT BEDTIME   loperamide (IMODIUM A-D) 2 MG tablet Take 2 mg by mouth as needed for diarrhea or loose stools.   loratadine (CLARITIN) 10 MG tablet Take 10 mg by mouth daily.   mirabegron ER (MYRBETRIQ) 50 MG TB24 tablet Take 50 mg by mouth every evening.   NON FORMULARY Diet-regular   omeprazole (PRILOSEC) 40 MG capsule  TAKE 1 CAPSULE EVERY DAY   OXYGEN Inhale 2 L into the lungs continuous.   potassium chloride (KLOR-CON) 20 MEQ packet Take 20 mEq by mouth daily.   STRIVERDI RESPIMAT 2.5 MCG/ACT AERS INHALE 2 PUFFS ONE TIME DAILY AT THE SAME TIME   verapamil (CALAN-SR) 240 MG CR tablet TAKE 1 TABLET AT BEDTIME   No facility-administered encounter  medications on file as of 01/01/2022.     SIGNIFICANT DIAGNOSTIC EXAMS  LABS REVIEWED PREVIOUS  01-06-21: wbc 6.1; hgb 11.3; hct 37.1; mcv 88; plt 278; glucose 150; bun 24; creat 1.25; k+ 3.2; na++ 148; ca 8.9; GFR 47; liver normal albumin 3.7; chol 152; ldl 61; trig 125; hdl 69; vit D 63.3; tsh 0.407; free T4; 0.84 03-30-21: wbc 5.5; hgb 8.9; hct 30.2; mcv 92.9 plt 210; glucose 78; bun 23; creat 0.86; k+ 3.5; na++ 143; ca 8.6 GFR>60; liver normal albumin 2.9; chol 141; ldl 65; trig 67; hdl 63 06-02-21: urine culture: no growth; hep C neg 07-21-21: wbc 4.8; hgb 10.0; hct 34.4; mcv 87.3 plt 224; glucose 138; bun 22; creat 0.87; k+ 3.4; na++ 130; ca 8.5; GFR>60 07-27-21: albumin 3.7; k+ 3.9 09-02-21: glucose 84; bun 18; creat 0.77; k+ 3.7; na++ 139; ca 9.0; GFR>60; liver normal albumin 4.0 tsh 1.075 free t4: 0.93   NO NEW LABS.   Review of Systems  Constitutional:  Negative for malaise/fatigue.  Respiratory:  Negative for cough and shortness of breath.   Cardiovascular:  Negative for chest pain, palpitations and leg swelling.  Gastrointestinal:  Negative for abdominal pain, constipation and heartburn.  Musculoskeletal:  Negative for back pain, joint pain and myalgias.  Skin: Negative.   Neurological:  Negative for dizziness.  Psychiatric/Behavioral:  The patient is not nervous/anxious.     Physical Exam Constitutional:      General: She is not in acute distress.    Appearance: She is well-developed. She is not diaphoretic.  Neck:     Thyroid: No thyromegaly.  Cardiovascular:     Rate and Rhythm: Normal rate and regular rhythm.     Pulses: Normal pulses.      Heart sounds: Murmur heard.     Comments: 2/6 Pulmonary:     Effort: Pulmonary effort is normal. No respiratory distress.     Breath sounds: Normal breath sounds.     Comments: 02 Abdominal:     General: Bowel sounds are normal. There is no distension.     Palpations: Abdomen is soft.     Tenderness: There is no abdominal tenderness.  Musculoskeletal:        General: Normal range of motion.     Cervical back: Neck supple.     Right lower leg: No edema.     Left lower leg: No edema.  Lymphadenopathy:     Cervical: No cervical adenopathy.  Skin:    General: Skin is warm and dry.  Neurological:     Mental Status: She is alert. Mental status is at baseline.  Psychiatric:        Mood and Affect: Mood normal.      ASSESSMENT/ PLAN:  TODAY  COPD with hypoxia Severe dementia without behavioral disturbance; without psychotic disturbance; without mood disturbance and anxiety.  Stage 3a chronic kidney disease  Will continue current medications Will continue current plan of care Will continue to monitor her status.   Time spent with patient: 40 minutes: plan of care; medications.    Ok Edwards NP [facility]   call [phone]

## 2022-01-04 ENCOUNTER — Other Ambulatory Visit (HOSPITAL_COMMUNITY)
Admission: RE | Admit: 2022-01-04 | Discharge: 2022-01-04 | Disposition: A | Discharge status: Home / Self Care | Payer: Medicare PPO | Source: Skilled Nursing Facility | Attending: *Deleted | Admitting: *Deleted

## 2022-01-04 ENCOUNTER — Encounter: Payer: Self-pay | Admitting: Adult Health

## 2022-01-04 ENCOUNTER — Non-Acute Institutional Stay (SKILLED_NURSING_FACILITY): Payer: Medicare PPO | Admitting: Adult Health

## 2022-01-04 DIAGNOSIS — Z1159 Encounter for screening for other viral diseases: Secondary | ICD-10-CM | POA: Diagnosis not present

## 2022-01-04 DIAGNOSIS — J9601 Acute respiratory failure with hypoxia: Secondary | ICD-10-CM | POA: Diagnosis not present

## 2022-01-04 DIAGNOSIS — Z882 Allergy status to sulfonamides status: Secondary | ICD-10-CM | POA: Diagnosis not present

## 2022-01-04 DIAGNOSIS — R0989 Other specified symptoms and signs involving the circulatory and respiratory systems: Secondary | ICD-10-CM | POA: Diagnosis not present

## 2022-01-04 DIAGNOSIS — E785 Hyperlipidemia, unspecified: Secondary | ICD-10-CM

## 2022-01-04 DIAGNOSIS — U071 COVID-19: Secondary | ICD-10-CM | POA: Diagnosis present

## 2022-01-04 DIAGNOSIS — J449 Chronic obstructive pulmonary disease, unspecified: Secondary | ICD-10-CM | POA: Diagnosis not present

## 2022-01-04 DIAGNOSIS — Z90710 Acquired absence of both cervix and uterus: Secondary | ICD-10-CM | POA: Diagnosis not present

## 2022-01-04 DIAGNOSIS — I129 Hypertensive chronic kidney disease with stage 1 through stage 4 chronic kidney disease, or unspecified chronic kidney disease: Secondary | ICD-10-CM | POA: Diagnosis present

## 2022-01-04 DIAGNOSIS — J181 Lobar pneumonia, unspecified organism: Secondary | ICD-10-CM | POA: Diagnosis not present

## 2022-01-04 DIAGNOSIS — A4189 Other specified sepsis: Secondary | ICD-10-CM | POA: Diagnosis not present

## 2022-01-04 DIAGNOSIS — Z66 Do not resuscitate: Secondary | ICD-10-CM | POA: Diagnosis not present

## 2022-01-04 DIAGNOSIS — N3281 Overactive bladder: Secondary | ICD-10-CM

## 2022-01-04 DIAGNOSIS — E872 Acidosis, unspecified: Secondary | ICD-10-CM | POA: Diagnosis present

## 2022-01-04 DIAGNOSIS — J440 Chronic obstructive pulmonary disease with acute lower respiratory infection: Secondary | ICD-10-CM | POA: Diagnosis not present

## 2022-01-04 DIAGNOSIS — J9621 Acute and chronic respiratory failure with hypoxia: Secondary | ICD-10-CM | POA: Diagnosis present

## 2022-01-04 DIAGNOSIS — R0902 Hypoxemia: Secondary | ICD-10-CM | POA: Diagnosis not present

## 2022-01-04 DIAGNOSIS — F01C0 Vascular dementia, severe, without behavioral disturbance, psychotic disturbance, mood disturbance, and anxiety: Secondary | ICD-10-CM | POA: Diagnosis not present

## 2022-01-04 DIAGNOSIS — R404 Transient alteration of awareness: Secondary | ICD-10-CM | POA: Diagnosis not present

## 2022-01-04 DIAGNOSIS — J441 Chronic obstructive pulmonary disease with (acute) exacerbation: Secondary | ICD-10-CM | POA: Diagnosis present

## 2022-01-04 DIAGNOSIS — F03C0 Unspecified dementia, severe, without behavioral disturbance, psychotic disturbance, mood disturbance, and anxiety: Secondary | ICD-10-CM | POA: Diagnosis present

## 2022-01-04 DIAGNOSIS — A419 Sepsis, unspecified organism: Secondary | ICD-10-CM | POA: Diagnosis not present

## 2022-01-04 DIAGNOSIS — K219 Gastro-esophageal reflux disease without esophagitis: Secondary | ICD-10-CM | POA: Diagnosis present

## 2022-01-04 DIAGNOSIS — Z87891 Personal history of nicotine dependence: Secondary | ICD-10-CM | POA: Diagnosis not present

## 2022-01-04 DIAGNOSIS — R0603 Acute respiratory distress: Secondary | ICD-10-CM | POA: Diagnosis not present

## 2022-01-04 DIAGNOSIS — N179 Acute kidney failure, unspecified: Secondary | ICD-10-CM | POA: Diagnosis present

## 2022-01-04 DIAGNOSIS — F32A Depression, unspecified: Secondary | ICD-10-CM | POA: Diagnosis present

## 2022-01-04 DIAGNOSIS — I471 Supraventricular tachycardia: Secondary | ICD-10-CM | POA: Diagnosis not present

## 2022-01-04 DIAGNOSIS — J9611 Chronic respiratory failure with hypoxia: Secondary | ICD-10-CM | POA: Diagnosis not present

## 2022-01-04 DIAGNOSIS — Z881 Allergy status to other antibiotic agents status: Secondary | ICD-10-CM | POA: Diagnosis not present

## 2022-01-04 DIAGNOSIS — F0390 Unspecified dementia without behavioral disturbance: Secondary | ICD-10-CM | POA: Diagnosis not present

## 2022-01-04 DIAGNOSIS — I10 Essential (primary) hypertension: Secondary | ICD-10-CM | POA: Diagnosis not present

## 2022-01-04 DIAGNOSIS — R64 Cachexia: Secondary | ICD-10-CM | POA: Diagnosis not present

## 2022-01-04 DIAGNOSIS — R6521 Severe sepsis with septic shock: Secondary | ICD-10-CM | POA: Diagnosis not present

## 2022-01-04 DIAGNOSIS — J189 Pneumonia, unspecified organism: Secondary | ICD-10-CM | POA: Diagnosis not present

## 2022-01-04 DIAGNOSIS — N1831 Chronic kidney disease, stage 3a: Secondary | ICD-10-CM | POA: Diagnosis present

## 2022-01-04 DIAGNOSIS — J9622 Acute and chronic respiratory failure with hypercapnia: Secondary | ICD-10-CM | POA: Diagnosis present

## 2022-01-04 DIAGNOSIS — J1282 Pneumonia due to coronavirus disease 2019: Secondary | ICD-10-CM | POA: Diagnosis present

## 2022-01-04 DIAGNOSIS — I959 Hypotension, unspecified: Secondary | ICD-10-CM | POA: Diagnosis not present

## 2022-01-04 DIAGNOSIS — R000 Tachycardia, unspecified: Secondary | ICD-10-CM | POA: Diagnosis not present

## 2022-01-04 DIAGNOSIS — J9811 Atelectasis: Secondary | ICD-10-CM | POA: Diagnosis not present

## 2022-01-04 DIAGNOSIS — J984 Other disorders of lung: Secondary | ICD-10-CM | POA: Diagnosis not present

## 2022-01-04 DIAGNOSIS — G4733 Obstructive sleep apnea (adult) (pediatric): Secondary | ICD-10-CM | POA: Diagnosis present

## 2022-01-04 LAB — CBC
HCT: 32.9 % — ABNORMAL LOW (ref 36.0–46.0)
Hemoglobin: 10 g/dL — ABNORMAL LOW (ref 12.0–15.0)
MCH: 27.1 pg (ref 26.0–34.0)
MCHC: 30.4 g/dL (ref 30.0–36.0)
MCV: 89.2 fL (ref 80.0–100.0)
Platelets: 188 10*3/uL (ref 150–400)
RBC: 3.69 MIL/uL — ABNORMAL LOW (ref 3.87–5.11)
RDW: 15.1 % (ref 11.5–15.5)
WBC: 5.8 10*3/uL (ref 4.0–10.5)
nRBC: 0 % (ref 0.0–0.2)

## 2022-01-04 LAB — BASIC METABOLIC PANEL
Anion gap: 6 (ref 5–15)
BUN: 21 mg/dL (ref 8–23)
CO2: 27 mmol/L (ref 22–32)
Calcium: 9.2 mg/dL (ref 8.9–10.3)
Chloride: 103 mmol/L (ref 98–111)
Creatinine, Ser: 0.88 mg/dL (ref 0.44–1.00)
GFR, Estimated: >60 mL/min (ref >60)
Glucose, Bld: 78 mg/dL (ref 70–99)
Potassium: 3.8 mmol/L (ref 3.5–5.1)
Sodium: 136 mmol/L (ref 135–145)

## 2022-01-04 LAB — D-DIMER, QUANTITATIVE: D-Dimer, Quant: 0.54 ug/mL-FEU — ABNORMAL HIGH (ref 0.00–0.50)

## 2022-01-04 LAB — C-REACTIVE PROTEIN: CRP: 1.6 mg/dL — ABNORMAL HIGH (ref <1.0)

## 2022-01-04 NOTE — Progress Notes (Signed)
Location:  [facility] Room Number: 149-W Place of Service:  SNF (31)   CODE STATUS: DNR  Allergies  Allergen Reactions   Fenofibrate Nausea And Vomiting   Pravastatin Sodium Nausea And Vomiting   Sulfonamide Derivatives Other (See Comments)    unknown   Ciprofloxacin Rash   Chief Complaint  Patient presents with   Medical Management of Chronic Issues        Hyperlipidemia LDL goal <70;   OAB  COPD with hpoxia:            Acute Visit    Covid +     HPI:  She is a 81 year old long term resident of this facility being seen for the management of her chronic illnesses: Hyperlipidemia LDL goal <70;   OAB  COPD with hpoxia. She has tested positive for COVID. She tells me that she is tired and short of breath. She denies any coughing; no increased sputum. There are no reports of fevers. She has been started on antiviral medication.   Past Medical History:  Diagnosis Date   Abnormality of gait 06/06/2014   Allergic rhinitis    Anemia    Ankle fracture, right    Anxiety    Arm fracture, left    Arthritis    abnormal gait    COPD (chronic obstructive pulmonary disease) (HCC)    chronic CO2 retention, decreased DLCO    Cystic acne    adult    Degenerative disc disease, cervical    syrinx C3-7   Degenerative disc disease, lumbar    L5 nerve impingement    Dementia (HCC)    Depression    DNR (do not resuscitate)    Fracture of right proximal fibula 07/13/18 09/05/2018   GERD (gastroesophageal reflux disease)    Heme positive stool 10/10/2017   History of colon surgery    right hemicolectomy for high-grade adenomas in right colon 2013   Hyperglycemia    Hypertension    Low back pain    Mediastinal lymphadenopathy 1/9   resolving    Memory difficulty 09/20/2014   mild dementia   Onychomycosis    OSA on CPAP    Sleep apnea    stop bang score 6    Past Surgical History:  Procedure Laterality Date   ABDOMINAL HYSTERECTOMY  1976   secondary to  bleeding    APPENDECTOMY     COLON RESECTION  08/18/2012   Procedure: HAND ASSISTED LAPAROSCOPIC COLON RESECTION;  Surgeon: Jamesetta So, MD;  Location: [facility];  Service: General;  Laterality: N/A;   COLONOSCOPY  07/20/2012   Dr. Gala Romney: multiple colonic polyps with large polyps on right side, s/p saline-assisted debulking piecemeal polypectomy and ablation. Not all removed. Path with tubulovillous adenomas, high grade dysplasia.    COLONOSCOPY N/A 03/09/2013   JSH:[id number] polyps-tubular adenomas. S/p right hemicolectomy. next tcs 02/2018   epidural steroids     ESOPHAGOGASTRODUODENOSCOPY (EGD) WITH ESOPHAGEAL DILATION N/A 02/06/2014   Procedure: ESOPHAGOGASTRODUODENOSCOPY (EGD) WITH ESOPHAGEAL DILATION;  Surgeon: Daneil Dolin, MD;  Location: [facility];  Service: Endoscopy;  Laterality: N/A;  9:30   OOPHORECTOMY  1976   ORIF ANKLE FRACTURE  01/18/2012   Procedure: OPEN REDUCTION INTERNAL FIXATION (ORIF) ANKLE FRACTURE;  Surgeon: Arther Abbott, MD;  Location: [facility];  Service: Orthopedics;  Laterality: Left;   ORIF ANKLE FRACTURE Right 01/13/2016   Procedure: OPEN REDUCTION INTERNAL FIXATION (ORIF) ANKLE FRACTURE;  Surgeon: Carole Civil, MD;  Location: [facility];  Service: Orthopedics;  Laterality: Right;    Social History   Socioeconomic History   Marital status: Widowed    Spouse name: Not on file   Number of children: 4   Years of education: college 1   Highest education level: Not on file  Occupational History   Occupation: employed in the tobacco industry     Employer: RETIRED   Occupation: retired  Tobacco Use   Smoking status: Former    Packs/day: 1.50    Years: 40.00    Pack years: 60.00    Types: Cigarettes    Quit date: 06/21/2006    Years since quitting: 15.5   Smokeless tobacco: Never  Vaping Use   Vaping Use: Never used  Substance and Sexual Activity   Alcohol use: No   Drug use: No   Sexual activity: Not Currently  Other Topics Concern   Not on file   Social History Narrative   Long term resident of [facility]    Social Determinants of Health   Financial Resource Strain: Not on file  Food Insecurity: Not on file  Transportation Needs: Not on file  Physical Activity: Not on file  Stress: Not on file  Social Connections: Not on file  Intimate Partner Violence: Not on file   Family History  Problem Relation Age of Onset   Stomach cancer Father    Cancer Father    Cancer Mother        brain    Breast cancer Mother    Arthritis Other       VITAL SIGNS BP 133/72    Pulse 73    Temp 98.6 F (37 C)    Resp 20    Ht 6\' 1"  (1.854 m)    Wt 167 lb 3.2 oz (75.8 kg)    SpO2 95%    BMI 22.06 kg/m   Outpatient Encounter Medications as of 01/04/2022  Medication Sig   acetaminophen (TYLENOL) 325 MG tablet Take 650 mg by mouth every 8 (eight) hours as needed.   albuterol (VENTOLIN HFA) 108 (90 Base) MCG/ACT inhaler Inhale 2 puffs into the lungs every 6 (six) hours as needed for wheezing or shortness of breath.   alendronate (FOSAMAX) 70 MG tablet Take 70 mg by mouth once a week. Take on an empty stomach at least 30 mns prior to food. Sit upright x 63mns after taking meds.   amLODipine (NORVASC) 2.5 MG tablet Take 1 tablet (2.5 mg total) by mouth daily.   atorvastatin (LIPITOR) 10 MG tablet TAKE 1 TABLET EVERY DAY   CALCIUM CARBONATE-VITAMIN D PO Take by mouth. 600 mg-10 mcg, oral, Once A Day   Cholecalciferol (VITAMIN D3 PO) Take 1,000 Units by mouth daily.   donepezil (ARICEPT) 10 MG tablet TAKE 1 TABLET AT BEDTIME   ergocalciferol (VITAMIN D2) 1.25 MG (50000 UT) capsule Take 50,000 Units by mouth once a week.   Homeopathic Products (ZINC) LOZG 50 mg zinc (220 mg); oral Once A Day   loperamide (IMODIUM A-D) 2 MG tablet Take 2 mg by mouth as needed for diarrhea or loose stools.   loratadine (CLARITIN) 10 MG tablet Take 10 mg by mouth daily.   mirabegron ER (MYRBETRIQ) 50 MG TB24 tablet Take 50 mg by mouth every evening.   molnupiravir EUA  (LAGEVRIO) 200 MG CAPS capsule Take 4 capsules by mouth 2 (two) times daily.   NON FORMULARY Diet-regular   omeprazole (PRILOSEC) 40 MG capsule TAKE 1 CAPSULE EVERY DAY  OXYGEN Inhale 2 L into the lungs continuous.   potassium chloride (KLOR-CON) 20 MEQ packet Take 20 mEq by mouth daily.   STRIVERDI RESPIMAT 2.5 MCG/ACT AERS INHALE 2 PUFFS ONE TIME DAILY AT THE SAME TIME   verapamil (CALAN-SR) 240 MG CR tablet TAKE 1 TABLET AT BEDTIME   vitamin C (ASCORBIC ACID) 500 MG tablet Take 500 mg by mouth 2 (two) times daily.   No facility-administered encounter medications on file as of 01/04/2022.     SIGNIFICANT DIAGNOSTIC EXAMS  LABS REVIEWED PREVIOUS  01-06-21: wbc 6.1; hgb 11.3; hct 37.1; mcv 88; plt 278; glucose 150; bun 24; creat 1.25; k+ 3.2; na++ 148; ca 8.9; GFR 47; liver normal albumin 3.7; chol 152; ldl 61; trig 125; hdl 69; vit D 63.3; tsh 0.407; free T4; 0.84 03-30-21: wbc 5.5; hgb 8.9; hct 30.2; mcv 92.9 plt 210; glucose 78; bun 23; creat 0.86; k+ 3.5; na++ 143; ca 8.6 GFR>60; liver normal albumin 2.9; chol 141; ldl 65; trig 67; hdl 63 06-02-21: urine culture: no growth; hep C neg 07-21-21: wbc 4.8; hgb 10.0; hct 34.4; mcv 87.3 plt 224; glucose 138; bun 22; creat 0.87; k+ 3.4; na++ 130; ca 8.5; GFR>60 07-27-21: albumin 3.7; k+ 3.9 09-02-21: glucose 84; bun 18; creat 0.77; k+ 3.7; na++ 139; ca 9.0; GFR>60; liver normal albumin 4.0 tsh 1.075 free t4: 0.93   TODAY  12-03-21: wbc 3.6; hgb 10.3; hct 33.9; mcv 88.3 plt 220; glucose 87; bun 19; creat 0.92; k+ 4.1; na++ 140; ca 8.9; >60; total protein 6.1 albumin 3.6 01-04-22: swbc 5.8; hgb 10.0; hct 32.9; mcv 89.2 plt 188; glucose 78; bun 0.88; creat 0.88; k+ 3.8; na++ 136; ca 9.2; GFR >60 d-dimer 0.54; CRP 1.6   Review of Systems  Constitutional:  Positive for malaise/fatigue.  Respiratory:  Positive for cough. Negative for shortness of breath.   Cardiovascular:  Negative for chest pain, palpitations and leg swelling.  Gastrointestinal:   Negative for abdominal pain, constipation and heartburn.  Musculoskeletal:  Negative for back pain, joint pain and myalgias.  Skin: Negative.   Neurological:  Negative for dizziness.  Psychiatric/Behavioral:  The patient is not nervous/anxious.      Physical Exam Constitutional:      General: She is not in acute distress.    Appearance: She is well-developed. She is not diaphoretic.  Neck:     Thyroid: No thyromegaly.  Cardiovascular:     Rate and Rhythm: Normal rate and regular rhythm.     Pulses: Normal pulses.     Heart sounds: Murmur heard.     Comments: 2/6 Pulmonary:     Effort: Pulmonary effort is normal. No respiratory distress.     Breath sounds: Normal breath sounds.     Comments: 02 Abdominal:     General: Bowel sounds are normal. There is no distension.     Palpations: Abdomen is soft.     Tenderness: There is no abdominal tenderness.  Musculoskeletal:        General: Normal range of motion.     Cervical back: Neck supple.     Right lower leg: No edema.     Left lower leg: No edema.  Lymphadenopathy:     Cervical: No cervical adenopathy.  Skin:    General: Skin is warm and dry.  Neurological:     Mental Status: She is alert. Mental status is at baseline.  Psychiatric:        Mood and Affect: Mood normal.    .  ASSESSMENT AND PLAN   TODAY  COVID 19 with pulmonary comorbidity: lab work has returned without significant findings. Awaiting chest x-ray results. Will continue supplements and antivirals. Will monitor her status  2. Hyperlipidemia LDL goal <70; LDL 65 will continue lipitor 10 mg daily  3. OAB is stable will continue mybetriq 50 mg daily   4. COPD with hpoxia: is stable is 02 dependent will continue srriverdi respimat 2.5 mcg 2 puffs daily; claritin 10 mg daily albuterol 2 puffs every 6 hours as needed     PREVIOUS   5. Essential hypertension: is stable b/p 124/78; will continue norvasc 2.5 mg daily calan sr 240 mg daily    6.  Esophageal reflux disease without esophagitis: is stable will continue prilosec 40 mg daily  7. Vascular dementia without behavioral disturbance: weight is stable at 169 pounds; will continue aricept 10 mg daily   8. Generalized osteoarthritis of multiple sites: is stable has tylenol 650 mg every 6 hours  9. Age related osteoporosis without current pathological fracture: is stable will continue fosamax 70 mg weekly   10. Stage 3a chronic kidney disease: is stable bun 18 creat 0.77 GFR>60  11. Normocytic anemia is stable hgb 8.9 will monitor   13. 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 Low thyroid stimulatin hormone (TSH) is stable tsh 1.075 free t4: 0.93 will monitor     Ok Edwards NP [facility]  call [phone]

## 2022-01-06 ENCOUNTER — Emergency Department (HOSPITAL_COMMUNITY): Payer: Medicare PPO

## 2022-01-06 ENCOUNTER — Encounter (HOSPITAL_COMMUNITY): Payer: Self-pay | Admitting: Emergency Medicine

## 2022-01-06 ENCOUNTER — Other Ambulatory Visit: Payer: Self-pay

## 2022-01-06 ENCOUNTER — Inpatient Hospital Stay (HOSPITAL_COMMUNITY)
Admission: EM | Admit: 2022-01-06 | Discharge: 2022-01-08 | DRG: 871 | Disposition: A | Discharge status: Skilled Nursing Facility | Payer: Medicare PPO | Source: Skilled Nursing Facility | Attending: Internal Medicine | Admitting: Internal Medicine

## 2022-01-06 DIAGNOSIS — Z66 Do not resuscitate: Secondary | ICD-10-CM | POA: Diagnosis present

## 2022-01-06 DIAGNOSIS — F32A Depression, unspecified: Secondary | ICD-10-CM | POA: Diagnosis present

## 2022-01-06 DIAGNOSIS — Z888 Allergy status to other drugs, medicaments and biological substances status: Secondary | ICD-10-CM

## 2022-01-06 DIAGNOSIS — K219 Gastro-esophageal reflux disease without esophagitis: Secondary | ICD-10-CM | POA: Diagnosis present

## 2022-01-06 DIAGNOSIS — Z1159 Encounter for screening for other viral diseases: Secondary | ICD-10-CM

## 2022-01-06 DIAGNOSIS — J441 Chronic obstructive pulmonary disease with (acute) exacerbation: Secondary | ICD-10-CM | POA: Diagnosis present

## 2022-01-06 DIAGNOSIS — Z87891 Personal history of nicotine dependence: Secondary | ICD-10-CM | POA: Diagnosis not present

## 2022-01-06 DIAGNOSIS — G4733 Obstructive sleep apnea (adult) (pediatric): Secondary | ICD-10-CM | POA: Diagnosis present

## 2022-01-06 DIAGNOSIS — J9621 Acute and chronic respiratory failure with hypoxia: Secondary | ICD-10-CM | POA: Diagnosis present

## 2022-01-06 DIAGNOSIS — F01C0 Vascular dementia, severe, without behavioral disturbance, psychotic disturbance, mood disturbance, and anxiety: Secondary | ICD-10-CM | POA: Diagnosis not present

## 2022-01-06 DIAGNOSIS — R64 Cachexia: Secondary | ICD-10-CM | POA: Diagnosis present

## 2022-01-06 DIAGNOSIS — J181 Lobar pneumonia, unspecified organism: Secondary | ICD-10-CM

## 2022-01-06 DIAGNOSIS — I471 Supraventricular tachycardia, unspecified: Secondary | ICD-10-CM

## 2022-01-06 DIAGNOSIS — I10 Essential (primary) hypertension: Secondary | ICD-10-CM

## 2022-01-06 DIAGNOSIS — J984 Other disorders of lung: Secondary | ICD-10-CM | POA: Insufficient documentation

## 2022-01-06 DIAGNOSIS — Z882 Allergy status to sulfonamides status: Secondary | ICD-10-CM | POA: Diagnosis not present

## 2022-01-06 DIAGNOSIS — Z881 Allergy status to other antibiotic agents status: Secondary | ICD-10-CM

## 2022-01-06 DIAGNOSIS — E785 Hyperlipidemia, unspecified: Secondary | ICD-10-CM | POA: Diagnosis present

## 2022-01-06 DIAGNOSIS — U071 COVID-19: Secondary | ICD-10-CM | POA: Insufficient documentation

## 2022-01-06 DIAGNOSIS — F0390 Unspecified dementia without behavioral disturbance: Secondary | ICD-10-CM | POA: Diagnosis not present

## 2022-01-06 DIAGNOSIS — A419 Sepsis, unspecified organism: Secondary | ICD-10-CM | POA: Diagnosis not present

## 2022-01-06 DIAGNOSIS — Z6821 Body mass index (BMI) 21.0-21.9, adult: Secondary | ICD-10-CM

## 2022-01-06 DIAGNOSIS — Z803 Family history of malignant neoplasm of breast: Secondary | ICD-10-CM

## 2022-01-06 DIAGNOSIS — J9622 Acute and chronic respiratory failure with hypercapnia: Secondary | ICD-10-CM | POA: Diagnosis present

## 2022-01-06 DIAGNOSIS — R6521 Severe sepsis with septic shock: Secondary | ICD-10-CM | POA: Diagnosis present

## 2022-01-06 DIAGNOSIS — N183 Chronic kidney disease, stage 3 unspecified: Secondary | ICD-10-CM | POA: Diagnosis present

## 2022-01-06 DIAGNOSIS — F03C0 Unspecified dementia, severe, without behavioral disturbance, psychotic disturbance, mood disturbance, and anxiety: Secondary | ICD-10-CM | POA: Diagnosis present

## 2022-01-06 DIAGNOSIS — N1831 Chronic kidney disease, stage 3a: Secondary | ICD-10-CM | POA: Diagnosis present

## 2022-01-06 DIAGNOSIS — J9601 Acute respiratory failure with hypoxia: Secondary | ICD-10-CM | POA: Diagnosis not present

## 2022-01-06 DIAGNOSIS — I129 Hypertensive chronic kidney disease with stage 1 through stage 4 chronic kidney disease, or unspecified chronic kidney disease: Secondary | ICD-10-CM | POA: Diagnosis present

## 2022-01-06 DIAGNOSIS — J189 Pneumonia, unspecified organism: Secondary | ICD-10-CM | POA: Diagnosis present

## 2022-01-06 DIAGNOSIS — A4189 Other specified sepsis: Secondary | ICD-10-CM | POA: Diagnosis present

## 2022-01-06 DIAGNOSIS — Z800 Family history of malignant neoplasm of digestive organs: Secondary | ICD-10-CM

## 2022-01-06 DIAGNOSIS — J440 Chronic obstructive pulmonary disease with acute lower respiratory infection: Secondary | ICD-10-CM | POA: Diagnosis present

## 2022-01-06 DIAGNOSIS — Z7983 Long term (current) use of bisphosphonates: Secondary | ICD-10-CM

## 2022-01-06 DIAGNOSIS — E872 Acidosis, unspecified: Secondary | ICD-10-CM | POA: Diagnosis present

## 2022-01-06 DIAGNOSIS — J1282 Pneumonia due to coronavirus disease 2019: Secondary | ICD-10-CM | POA: Diagnosis present

## 2022-01-06 DIAGNOSIS — N179 Acute kidney failure, unspecified: Secondary | ICD-10-CM | POA: Diagnosis present

## 2022-01-06 DIAGNOSIS — Z9981 Dependence on supplemental oxygen: Secondary | ICD-10-CM

## 2022-01-06 DIAGNOSIS — Z90710 Acquired absence of both cervix and uterus: Secondary | ICD-10-CM

## 2022-01-06 LAB — COMPREHENSIVE METABOLIC PANEL
ALT: 20 U/L (ref 0–44)
ALT: 16 U/L (ref 0–44)
AST: 28 U/L (ref 15–41)
AST: 22 U/L (ref 15–41)
Albumin: 4 g/dL (ref 3.5–5.0)
Albumin: 3.2 g/dL — ABNORMAL LOW (ref 3.5–5.0)
Alkaline Phosphatase: 82 U/L (ref 38–126)
Alkaline Phosphatase: 64 U/L (ref 38–126)
Anion gap: 10 (ref 5–15)
Anion gap: 13 (ref 5–15)
BUN: 26 mg/dL — ABNORMAL HIGH (ref 8–23)
BUN: 25 mg/dL — ABNORMAL HIGH (ref 8–23)
CO2: 25 mmol/L (ref 22–32)
CO2: 20 mmol/L — ABNORMAL LOW (ref 22–32)
Calcium: 9 mg/dL (ref 8.9–10.3)
Calcium: 8.2 mg/dL — ABNORMAL LOW (ref 8.9–10.3)
Chloride: 101 mmol/L (ref 98–111)
Chloride: 105 mmol/L (ref 98–111)
Creatinine, Ser: 1.24 mg/dL — ABNORMAL HIGH (ref 0.44–1.00)
Creatinine, Ser: 1.08 mg/dL — ABNORMAL HIGH (ref 0.44–1.00)
GFR, Estimated: 44 mL/min — ABNORMAL LOW (ref >60)
GFR, Estimated: 52 mL/min — ABNORMAL LOW (ref >60)
Glucose, Bld: 160 mg/dL — ABNORMAL HIGH (ref 70–99)
Glucose, Bld: 148 mg/dL — ABNORMAL HIGH (ref 70–99)
Potassium: 4 mmol/L (ref 3.5–5.1)
Potassium: 3.5 mmol/L (ref 3.5–5.1)
Sodium: 136 mmol/L (ref 135–145)
Sodium: 138 mmol/L (ref 135–145)
Total Bilirubin: 1 mg/dL (ref 0.3–1.2)
Total Bilirubin: 0.5 mg/dL (ref 0.3–1.2)
Total Protein: 7.4 g/dL (ref 6.5–8.1)
Total Protein: 6.6 g/dL (ref 6.5–8.1)

## 2022-01-06 LAB — MAGNESIUM: Magnesium: 2.3 mg/dL (ref 1.7–2.4)

## 2022-01-06 LAB — RESP PANEL BY RT-PCR (FLU A&B, COVID) ARPGX2
Influenza A by PCR: NEGATIVE
Influenza B by PCR: NEGATIVE
SARS Coronavirus 2 by RT PCR: POSITIVE — AB

## 2022-01-06 LAB — CBC
HCT: 40.6 % (ref 36.0–46.0)
Hemoglobin: 12 g/dL (ref 12.0–15.0)
MCH: 26.5 pg (ref 26.0–34.0)
MCHC: 29.6 g/dL — ABNORMAL LOW (ref 30.0–36.0)
MCV: 89.8 fL (ref 80.0–100.0)
Platelets: 229 10*3/uL (ref 150–400)
RBC: 4.52 MIL/uL (ref 3.87–5.11)
RDW: 15.2 % (ref 11.5–15.5)
WBC: 10.6 10*3/uL — ABNORMAL HIGH (ref 4.0–10.5)
nRBC: 0 % (ref 0.0–0.2)

## 2022-01-06 LAB — PROCALCITONIN: Procalcitonin: 1.38 ng/mL

## 2022-01-06 LAB — BLOOD GAS, VENOUS
Acid-base deficit: 0.9 mmol/L (ref 0.0–2.0)
Bicarbonate: 25.8 mmol/L (ref 20.0–28.0)
Drawn by: 6643
FIO2: 50 %
O2 Saturation: 92.7 %
Patient temperature: 38.4
pCO2, Ven: 53 mmHg (ref 44–60)
pH, Ven: 7.3 (ref 7.25–7.43)
pO2, Ven: 73 mmHg — ABNORMAL HIGH (ref 32–45)

## 2022-01-06 LAB — CBC WITH DIFFERENTIAL/PLATELET
Abs Immature Granulocytes: 0.03 10*3/uL (ref 0.00–0.07)
Basophils Absolute: 0 10*3/uL (ref 0.0–0.1)
Basophils Relative: 0 %
Eosinophils Absolute: 0 10*3/uL (ref 0.0–0.5)
Eosinophils Relative: 0 %
HCT: 34.6 % — ABNORMAL LOW (ref 36.0–46.0)
Hemoglobin: 10.2 g/dL — ABNORMAL LOW (ref 12.0–15.0)
Immature Granulocytes: 0 %
Lymphocytes Relative: 3 %
Lymphs Abs: 0.3 10*3/uL — ABNORMAL LOW (ref 0.7–4.0)
MCH: 26.6 pg (ref 26.0–34.0)
MCHC: 29.5 g/dL — ABNORMAL LOW (ref 30.0–36.0)
MCV: 90.3 fL (ref 80.0–100.0)
Monocytes Absolute: 0.3 10*3/uL (ref 0.1–1.0)
Monocytes Relative: 3 %
Neutro Abs: 8.8 10*3/uL — ABNORMAL HIGH (ref 1.7–7.7)
Neutrophils Relative %: 94 %
Platelets: 174 10*3/uL (ref 150–400)
RBC: 3.83 MIL/uL — ABNORMAL LOW (ref 3.87–5.11)
RDW: 15.2 % (ref 11.5–15.5)
WBC: 9.4 10*3/uL (ref 4.0–10.5)
nRBC: 0 % (ref 0.0–0.2)

## 2022-01-06 LAB — CORTISOL-AM, BLOOD: Cortisol - AM: 46.9 ug/dL — ABNORMAL HIGH (ref 6.7–22.6)

## 2022-01-06 LAB — LACTIC ACID, PLASMA
Lactic Acid, Venous: 3.1 mmol/L (ref 0.5–1.9)
Lactic Acid, Venous: 3.1 mmol/L (ref 0.5–1.9)

## 2022-01-06 LAB — STREP PNEUMONIAE URINARY ANTIGEN: Strep Pneumo Urinary Antigen: NEGATIVE

## 2022-01-06 LAB — PROTIME-INR
INR: 1 (ref 0.8–1.2)
INR: 1.1 (ref 0.8–1.2)
Prothrombin Time: 13.4 seconds (ref 11.4–15.2)
Prothrombin Time: 14.2 seconds (ref 11.4–15.2)

## 2022-01-06 LAB — TROPONIN I (HIGH SENSITIVITY)
Troponin I (High Sensitivity): 11 ng/L (ref <18)
Troponin I (High Sensitivity): 28 ng/L — ABNORMAL HIGH (ref <18)

## 2022-01-06 LAB — BRAIN NATRIURETIC PEPTIDE: B Natriuretic Peptide: 60 pg/mL (ref 0.0–100.0)

## 2022-01-06 LAB — MRSA NEXT GEN BY PCR, NASAL: MRSA by PCR Next Gen: NOT DETECTED

## 2022-01-06 MED ORDER — ATORVASTATIN CALCIUM 10 MG PO TABS
10.0000 mg | ORAL_TABLET | Freq: Every day | ORAL | Status: DC
  Administered 2022-01-06 – 2022-01-08 (×3): 10 mg via ORAL
  Filled 2022-01-06 (×3): qty 1

## 2022-01-06 MED ORDER — SODIUM CHLORIDE 0.9 % IV SOLN
500.0000 mg | INTRAVENOUS | Status: DC
  Administered 2022-01-06 – 2022-01-08 (×3): 500 mg via INTRAVENOUS
  Filled 2022-01-06 (×3): qty 5

## 2022-01-06 MED ORDER — NOREPINEPHRINE 4 MG/250ML-% IV SOLN: 0.0000 ug/min | INTRAVENOUS | Status: DC

## 2022-01-06 MED ORDER — NOREPINEPHRINE 4 MG/250ML-% IV SOLN
2.0000 ug/min | INTRAVENOUS | Status: DC
  Administered 2022-01-06: 2 ug/min via INTRAVENOUS
  Filled 2022-01-06: qty 250

## 2022-01-06 MED ORDER — HYDROCOD POLI-CHLORPHE POLI ER 10-8 MG/5ML PO SUER: 5.0000 mL | Freq: Two times a day (BID) | ORAL | Status: DC | PRN

## 2022-01-06 MED ORDER — ONDANSETRON HCL 4 MG/2ML IJ SOLN
4.0000 mg | Freq: Four times a day (QID) | INTRAMUSCULAR | Status: DC | PRN
Start: 2022-01-06 — End: 2022-01-08

## 2022-01-06 MED ORDER — HYDROCORTISONE SOD SUC (PF) 100 MG IJ SOLR: 100.0000 mg | Freq: Two times a day (BID) | INTRAMUSCULAR | Status: DC

## 2022-01-06 MED ORDER — SODIUM CHLORIDE 0.9 % IV BOLUS
500.0000 mL | Freq: Once | INTRAVENOUS | Status: AC
  Administered 2022-01-06: 500 mL via INTRAVENOUS

## 2022-01-06 MED ORDER — METHYLPREDNISOLONE SODIUM SUCC 125 MG IJ SOLR
125.0000 mg | Freq: Once | INTRAMUSCULAR | Status: AC
  Administered 2022-01-06: 125 mg via INTRAVENOUS
  Filled 2022-01-06: qty 2

## 2022-01-06 MED ORDER — IPRATROPIUM-ALBUTEROL 0.5-2.5 (3) MG/3ML IN SOLN
3.0000 mL | Freq: Once | RESPIRATORY_TRACT | Status: AC
  Administered 2022-01-06: 3 mL via RESPIRATORY_TRACT
  Filled 2022-01-06: qty 3

## 2022-01-06 MED ORDER — SODIUM CHLORIDE 0.9 % IV BOLUS
1000.0000 mL | Freq: Once | INTRAVENOUS | Status: AC
Start: 2022-01-06 — End: 2022-01-06
  Administered 2022-01-06: 1000 mL via INTRAVENOUS

## 2022-01-06 MED ORDER — IPRATROPIUM-ALBUTEROL 0.5-2.5 (3) MG/3ML IN SOLN
3.0000 mL | Freq: Two times a day (BID) | RESPIRATORY_TRACT | Status: DC
  Administered 2022-01-07: 3 mL via RESPIRATORY_TRACT
  Filled 2022-01-06: qty 3

## 2022-01-06 MED ORDER — ALBUTEROL SULFATE (2.5 MG/3ML) 0.083% IN NEBU: 2.5000 mg | INHALATION_SOLUTION | RESPIRATORY_TRACT | Status: DC | PRN

## 2022-01-06 MED ORDER — SODIUM CHLORIDE 0.9 % IV SOLN
2.0000 g | INTRAVENOUS | Status: DC
  Administered 2022-01-07 – 2022-01-08 (×2): 2 g via INTRAVENOUS
  Filled 2022-01-06 (×2): qty 20

## 2022-01-06 MED ORDER — ZINC SULFATE 220 (50 ZN) MG PO CAPS
220.0000 mg | ORAL_CAPSULE | Freq: Every day | ORAL | Status: DC
  Administered 2022-01-06 – 2022-01-08 (×3): 220 mg via ORAL
  Filled 2022-01-06 (×3): qty 1

## 2022-01-06 MED ORDER — SODIUM CHLORIDE 0.9 % IV SOLN
100.0000 mg | Freq: Every day | INTRAVENOUS | Status: DC
  Administered 2022-01-07 – 2022-01-08 (×2): 100 mg via INTRAVENOUS
  Filled 2022-01-06: qty 20
  Filled 2022-01-06: qty 100
  Filled 2022-01-06: qty 20

## 2022-01-06 MED ORDER — CHLORHEXIDINE GLUCONATE CLOTH 2 % EX PADS
6.0000 | MEDICATED_PAD | Freq: Every day | CUTANEOUS | Status: DC
  Administered 2022-01-06 – 2022-01-08 (×3): 6 via TOPICAL

## 2022-01-06 MED ORDER — IPRATROPIUM-ALBUTEROL 0.5-2.5 (3) MG/3ML IN SOLN
3.0000 mL | Freq: Four times a day (QID) | RESPIRATORY_TRACT | Status: DC
  Administered 2022-01-06: 3 mL via RESPIRATORY_TRACT
  Filled 2022-01-06: qty 3

## 2022-01-06 MED ORDER — ASCORBIC ACID 500 MG PO TABS: 500.0000 mg | ORAL_TABLET | Freq: Every day | ORAL | Status: DC

## 2022-01-06 MED ORDER — ARFORMOTEROL TARTRATE 15 MCG/2ML IN NEBU
15.0000 ug | INHALATION_SOLUTION | Freq: Two times a day (BID) | RESPIRATORY_TRACT | Status: DC
  Administered 2022-01-06 – 2022-01-08 (×5): 15 ug via RESPIRATORY_TRACT
  Filled 2022-01-06 (×4): qty 2

## 2022-01-06 MED ORDER — ONDANSETRON HCL 4 MG PO TABS: 4.0000 mg | ORAL_TABLET | Freq: Four times a day (QID) | ORAL | Status: DC | PRN

## 2022-01-06 MED ORDER — MAGNESIUM SULFATE 2 GM/50ML IV SOLN
2.0000 g | Freq: Once | INTRAVENOUS | Status: AC
  Administered 2022-01-06: 2 g via INTRAVENOUS
  Filled 2022-01-06: qty 50

## 2022-01-06 MED ORDER — DONEPEZIL HCL 5 MG PO TABS
10.0000 mg | ORAL_TABLET | Freq: Every day | ORAL | Status: DC
  Administered 2022-01-06 – 2022-01-07 (×2): 10 mg via ORAL
  Filled 2022-01-06 (×2): qty 2

## 2022-01-06 MED ORDER — POTASSIUM CHLORIDE IN NACL 20-0.9 MEQ/L-% IV SOLN: INTRAVENOUS | Status: AC

## 2022-01-06 MED ORDER — METHYLPREDNISOLONE SODIUM SUCC 125 MG IJ SOLR
125.0000 mg | Freq: Every day | INTRAMUSCULAR | Status: DC
  Administered 2022-01-06 – 2022-01-08 (×3): 125 mg via INTRAVENOUS
  Filled 2022-01-06 (×3): qty 2

## 2022-01-06 MED ORDER — LACTATED RINGERS IV BOLUS
1000.0000 mL | Freq: Once | INTRAVENOUS | Status: AC
  Administered 2022-01-06: 1000 mL via INTRAVENOUS

## 2022-01-06 MED ORDER — SODIUM CHLORIDE 0.9 % IV SOLN: 250.0000 mL | INTRAVENOUS | Status: DC

## 2022-01-06 MED ORDER — SODIUM CHLORIDE 0.9 % IV SOLN
100.0000 mg | INTRAVENOUS | Status: AC
  Administered 2022-01-06 (×2): 100 mg via INTRAVENOUS
  Filled 2022-01-06 (×2): qty 20

## 2022-01-06 MED ORDER — MIRABEGRON ER 25 MG PO TB24
50.0000 mg | ORAL_TABLET | Freq: Every evening | ORAL | Status: DC
Start: 2022-01-06 — End: 2022-01-08
  Administered 2022-01-06 – 2022-01-07 (×2): 50 mg via ORAL
  Filled 2022-01-06 (×2): qty 2

## 2022-01-06 MED ORDER — ACETAMINOPHEN 650 MG RE SUPP: 650.0000 mg | Freq: Four times a day (QID) | RECTAL | Status: DC | PRN

## 2022-01-06 MED ORDER — SODIUM CHLORIDE 0.9 % IV SOLN
2.0000 g | Freq: Once | INTRAVENOUS | Status: AC
  Administered 2022-01-06: 2 g via INTRAVENOUS
  Filled 2022-01-06: qty 20

## 2022-01-06 MED ORDER — PANTOPRAZOLE SODIUM 40 MG PO TBEC: 40.0000 mg | DELAYED_RELEASE_TABLET | Freq: Every day | ORAL | Status: DC

## 2022-01-06 MED ORDER — GUAIFENESIN-DM 100-10 MG/5ML PO SYRP: 10.0000 mL | ORAL_SOLUTION | ORAL | Status: DC | PRN

## 2022-01-06 MED ORDER — ENSURE ENLIVE PO LIQD
237.0000 mL | Freq: Two times a day (BID) | ORAL | Status: DC
  Administered 2022-01-06 – 2022-01-08 (×4): 237 mL via ORAL

## 2022-01-06 MED ORDER — HEPARIN SODIUM (PORCINE) 5000 UNIT/ML IJ SOLN
5000.0000 [IU] | Freq: Three times a day (TID) | INTRAMUSCULAR | Status: DC
  Administered 2022-01-06 – 2022-01-08 (×7): 5000 [IU] via SUBCUTANEOUS
  Filled 2022-01-06 (×6): qty 1

## 2022-01-06 MED ORDER — ACETAMINOPHEN 325 MG PO TABS: 650.0000 mg | ORAL_TABLET | Freq: Four times a day (QID) | ORAL | Status: DC | PRN

## 2022-01-06 MED ORDER — PANTOPRAZOLE SODIUM 40 MG IV SOLR
40.0000 mg | INTRAVENOUS | Status: DC
Start: 2022-01-06 — End: 2022-01-08
  Administered 2022-01-06 – 2022-01-08 (×3): 40 mg via INTRAVENOUS
  Filled 2022-01-06 (×3): qty 10

## 2022-01-06 MED ORDER — BARICITINIB 2 MG PO TABS: 2.0000 mg | ORAL_TABLET | Freq: Every day | ORAL | Status: DC

## 2022-01-06 MED ORDER — TRAMADOL HCL 50 MG PO TABS: 50.0000 mg | ORAL_TABLET | Freq: Four times a day (QID) | ORAL | Status: DC | PRN

## 2022-01-06 MED ORDER — ASCORBIC ACID 500 MG PO TABS
500.0000 mg | ORAL_TABLET | Freq: Two times a day (BID) | ORAL | Status: DC
  Administered 2022-01-06 – 2022-01-08 (×5): 500 mg via ORAL
  Filled 2022-01-06 (×5): qty 1

## 2022-01-06 NOTE — Assessment & Plan Note (Deleted)
Baseline Cr 0.88 Today cr 1.24 2/2 sepsis 2L boluses in ED Trend in the AM Avoid nephrotoxic agents when possible

## 2022-01-06 NOTE — Assessment & Plan Note (Addendum)
Secondary to pneumonia Presented with tachycardia, fever, respiratory failure and hypotension Lactic acid 3.1 PCT 1.38 Continue ceftriaxone and azithromycin Urine Legionella antigen Urine Streptococcus pneumoniae antigen--neg Follow blood cultures--contaminant Restart IV fluids Levophed at 3 mcg/kg/min>> weaned off 01/06/22 Sepsis physiology resolved

## 2022-01-06 NOTE — Progress Notes (Signed)
Spoke with Gordy Levan, RN. Pressor turned off. No need for USG IV at this time.

## 2022-01-06 NOTE — Sepsis Progress Note (Signed)
Sepsis bundle complete.  Pt admitted to ICU, monitoring to be continued by bedside RN  

## 2022-01-06 NOTE — Assessment & Plan Note (Addendum)
PCT 1.38>>3.04 Continue ceftriaxone and azithromycin Urine Legionella antigen--neg Urine Streptococcus pneumoniae antigen--neg D/c with 4 more days cefdinir and azithro

## 2022-01-06 NOTE — Sepsis Progress Note (Signed)
Following per sepsis protocol   

## 2022-01-06 NOTE — H&P (Signed)
History and Physical    Patient: [name] JJO:[id number] DOB: Apr 02, 1941 DOA: 01/06/2022 DOS: the patient was seen and examined on 01/06/2022 PCP: Gerlene Fee, NP  Patient coming from: SNF  Chief Complaint:  Chief Complaint  Patient presents with   Respiratory Distress    HPI: [name] is a 81 y.o. female with medical history significant of COPD on 2 L nasal cannula at baseline, severe dementia without behavioral disturbance, hypertension, hyperlipidemia, stage III CKD, and more presents ED with a chief complaint of hypoxia.  Unfortunately patient is not able to provide any pain or dementia and being on the BiPAP.  Was noted that she was being treated for [place] outpatient, her course of molnupiravir was set to finish on 25 February.  Today she became hypoxic, and the nursing facility called EMS for respiratory distress.  EMS put her on 15 L nonrebreather when she arrived to the hospital she was only satting 87%.  She was transitioned to BiPAP 50% FiO2 and her sats improved.  When she arrived at the hospital she was also febrile, tachycardic, and tachypneic.  Sepsis protocol was started with an assumed pneumonia so Rocephin and Zithromax were started.  Chest x-ray does show mild bibasilar infiltrates greater on right than left.  Patient did arrive with her DNR form.  Review of systems could not be performed secondary to dementia and patient currently being on BiPAP.  Of note according to recent care plan patient requires limited to extensive assist for ADLs.  She has had 6 falls without injury.  She is able to feed herself and eats a regular diet.  Review of Systems: unable to review all systems due to the inability of the patient to answer questions. Past Medical History:  Diagnosis Date   Abnormality of gait 06/06/2014   Allergic rhinitis    Anemia    Ankle fracture, right    Anxiety    Arm fracture, left    Arthritis    abnormal gait    COPD (chronic  obstructive pulmonary disease) (HCC)    chronic CO2 retention, decreased DLCO    Cystic acne    adult    Degenerative disc disease, cervical    syrinx C3-7   Degenerative disc disease, lumbar    L5 nerve impingement    Dementia (HCC)    Depression    DNR (do not resuscitate)    Fracture of right proximal fibula 07/13/18 09/05/2018   GERD (gastroesophageal reflux disease)    Heme positive stool 10/10/2017   History of colon surgery    right hemicolectomy for high-grade adenomas in right colon 2013   Hyperglycemia    Hypertension    Low back pain    Mediastinal lymphadenopathy 1/9   resolving    Memory difficulty 09/20/2014   mild dementia   Onychomycosis    OSA on CPAP    Sleep apnea    stop bang score 6   Past Surgical History:  Procedure Laterality Date   ABDOMINAL HYSTERECTOMY  1976   secondary to bleeding    APPENDECTOMY     COLON RESECTION  08/18/2012   Procedure: HAND ASSISTED LAPAROSCOPIC COLON RESECTION;  Surgeon: Jamesetta So, MD;  Location: [facility];  Service: General;  Laterality: N/A;   COLONOSCOPY  07/20/2012   Dr. Gala Romney: multiple colonic polyps with large polyps on right side, s/p saline-assisted debulking piecemeal polypectomy and ablation. Not all removed. Path with tubulovillous adenomas, high grade dysplasia.    COLONOSCOPY  N/A 03/09/2013   EPP:IRJJOAC polyps-tubular adenomas. S/p right hemicolectomy. next tcs 02/2018   epidural steroids     ESOPHAGOGASTRODUODENOSCOPY (EGD) WITH ESOPHAGEAL DILATION N/A 02/06/2014   Procedure: ESOPHAGOGASTRODUODENOSCOPY (EGD) WITH ESOPHAGEAL DILATION;  Surgeon: Daneil Dolin, MD;  [facility];  Service: Endoscopy;  Laterality: N/A;  9:30   OOPHORECTOMY  1976   ORIF ANKLE FRACTURE  01/18/2012   Procedure: OPEN REDUCTION INTERNAL FIXATION (ORIF) ANKLE FRACTURE;  Surgeon: Arther Abbott, MD;  Location: [facility];  Service: Orthopedics;  Laterality: Left;   ORIF ANKLE FRACTURE Right 01/13/2016   Procedure: OPEN REDUCTION  INTERNAL FIXATION (ORIF) ANKLE FRACTURE;  Surgeon: Carole Civil, MD;  Location: [facility];  Service: Orthopedics;  Laterality: Right;   Social History:  reports that she quit smoking about 15 years ago. Her smoking use included cigarettes. She has a 60.00 pack-year smoking history. She has never used smokeless tobacco. She reports that she does not drink alcohol and does not use drugs.  Allergies  Allergen Reactions   Fenofibrate Nausea And Vomiting   Pravastatin Sodium Nausea And Vomiting   Sulfonamide Derivatives Other (See Comments)    unknown   Ciprofloxacin Rash    Family History  Problem Relation Age of Onset   Stomach cancer Father    Cancer Father    Cancer Mother        brain    Breast cancer Mother    Arthritis Other     Prior to Admission medications   Medication Sig Start Date End Date Taking? Authorizing Provider  acetaminophen (TYLENOL) 325 MG tablet Take 650 mg by mouth every 8 (eight) hours as needed.    [provider]  albuterol (VENTOLIN HFA) 108 (90 Base) MCG/ACT inhaler Inhale 2 puffs into the lungs every 6 (six) hours as needed for wheezing or shortness of breath. 07/14/20   [name], MD  alendronate (FOSAMAX) 70 MG tablet Take 70 mg by mouth once a week. Take on an empty stomach at least 30 mns prior to food. Sit upright x 74mns after taking meds.    [provider]  amLODipine (NORVASC) 2.5 MG tablet Take 1 tablet (2.5 mg total) by mouth daily. 12/19/20   [name], MD  atorvastatin (LIPITOR) 10 MG tablet TAKE 1 TABLET EVERY DAY 02/19/20   [name], MD  CALCIUM CARBONATE-VITAMIN D PO Take by mouth. 600 mg-10 mcg, oral, Once A Day    [provider]  Cholecalciferol (VITAMIN D3 PO) Take 1,000 Units by mouth daily.    [provider]  donepezil (ARICEPT) 10 MG tablet TAKE 1 TABLET AT BEDTIME 10/16/20   [name], MD  ergocalciferol (VITAMIN D2) 1.25 MG (50000 UT) capsule Take 50,000 Units  by mouth once a week.  01/18/22  [provider]  Homeopathic Products (ZINC) LOZG 50 mg zinc (220 mg); oral Once A Day    [provider]  loperamide (IMODIUM A-D) 2 MG tablet Take 2 mg by mouth as needed for diarrhea or loose stools.    [provider]  loratadine (CLARITIN) 10 MG tablet Take 10 mg by mouth daily.    [provider]  mirabegron ER (MYRBETRIQ) 50 MG TB24 tablet Take 50 mg by mouth every evening.    [provider]  molnupiravir EUA (LAGEVRIO) 200 MG CAPS capsule Take 4 capsules by mouth 2 (two) times daily.  01/09/22  [provider]  NON FORMULARY Diet-regular    [provider]  omeprazole (PRILOSEC) 40 MG capsule TAKE 1 CAPSULE EVERY DAY 02/19/21   Perlie Mayo, NP  OXYGEN Inhale 2 L into the lungs continuous.    [provider]  potassium chloride (KLOR-CON) 20 MEQ packet Take 20 mEq by mouth daily.    [provider]  STRIVERDI RESPIMAT 2.5 MCG/ACT AERS INHALE 2 PUFFS ONE TIME DAILY AT THE SAME TIME 10/02/20   [name], MD  verapamil (CALAN-SR) 240 MG CR tablet TAKE 1 TABLET AT BEDTIME 10/02/20   [name], MD  vitamin C (ASCORBIC ACID) 500 MG tablet Take 500 mg by mouth 2 (two) times daily.  01/18/22  [provider]    Physical Exam: Vitals:   01/06/22 0330 01/06/22 0410 01/06/22 0430 01/06/22 0435  BP: (!) 119/52 (!) 102/54 (!) 112/52   Pulse: (!) 112 71 91 89  Resp: 17 15 15 15   Temp:      TempSrc:      SpO2: 96% 91% 93% 93%  Weight:      Height:       1.  General: Patient lying supine in bed,  no acute distress   2. Psychiatric: Alert and oriented to self, flat affect and normal behavior for situation, pleasant and cooperative with exam   3. Neurologic: Speech and language are normal, face is symmetric    4. HEENMT:  Head is atraumatic, normocephalic, pupils reactive to light, neck is cachectic, trachea is midline, edentulous, mucous membranes  dry on BiPAP   5. Respiratory : Lungs are diminished in the lower lung fields but otherwise clear to auscultation bilaterally without wheezing, rhonchi, rales, no cyanosis    6. Cardiovascular : Heart rate normal, rhythm is regular, murmur present, rubs or gallops, no peripheral edema, peripheral pulses palpated   7. Gastrointestinal:  Abdomen is soft, nondistended, nontender to palpation bowel sounds active, no masses or organomegaly palpated   8. Skin:  Skin is warm, dry and intact without rashes, acute lesions, or ulcers on limited exam   9.Musculoskeletal:  No acute deformities or trauma, no asymmetry in tone, no peripheral edema, peripheral pulses palpated, no tenderness to palpation in the extremities   Data Reviewed: In the ED Temp 101.1, heart rate 111-121, respiratory rate 16-30, blood pressure 119/52, satting 96% on 50% FiO2 VBG shows a pH of 7.3, PCO2 53, PO2 73 No leukocytosis with a white blood cell count of 10.6, hemoglobin 12.0, platelets 229 Chemistry panel reveals a elevated creatinine of 1.24, up from 0.88 Hyperglycemia with a glucose of 116 Blood cultures pending COVID-positive Lactic acid 3.1 Chest x-ray shows mild bibasilar infiltrates with right greater than left EKG shows sinus tach, QTc 462, baseline artifact but no acute ischemic changes Troponin pending Zithromax and Rocephin started in the ED DuoNeb x2 given in the ED 1 L LR, 1 L NS in the ED Patient started on Solu-Medrol  Assessment and Plan: * Acute on chronic respiratory failure with hypoxia (HCC)- (present on admission) Requires 2L [place] at baseline Arrived satting at 87% on 15L NRB Sats improved on 50% FiO2 BiPAP 2/2 COPD exacerbation and pneumonia Trop pending EKG has a lot of baseline artifact, but no acute ischemic changes Continue treatments as above Continue to monitor  Pneumonia due to COVID-19 virus- (present on admission) Diagnosed with covid 19 in the outpatient setting Has been  on molnupiravir - course to finish on 2/25 Now requiring BiPAP, will start baricitinib  CXR shows R>L infiltrates, concern for superimposed bacterial  infection (in setting of sepsis) Continue antibiotics until procal results - discontinue antibiotics if indicated at that time Covid order set utilized  Dementia [facility])- (present on admission) Continue aricept Continue to monitor Will likely be dispo'ed back to SNF  AKI (acute kidney injury) ([place])- (present on admission) Baseline Cr 0.88 Today cr 1.24 2/2 sepsis 2L boluses in ED Trend in the AM Avoid nephrotoxic agents when possible   Sepsis ([place])- (present on admission) Sepsis secondary to pneumonia SIRS criteria: tachycardia, tachypnea, respiratory failure, lactic acidosis, AKI Most likely source of infection is Covid 19, but concern for superimposed bacterial pneumonia- procalcitonin pending Life threatening organ dysfunction 2/2 infection is evidenced by: respiratory failure and lactic acidosis Antibiotics started Rocephin and zithromax Fluids given prior to admission 2L boluses, patient did become hypotensive when she arrived to the ICU.  Another 500 mL bolus given at that time and low-dose peripheral Levophed started Sepsis order set utilized Monitor this patient on Stepdown Blood cultures and sputum cultures pending   GERD- (present on admission) Continue PPI  Essential hypertension- (present on admission) Holding amlodipine and verapamil in the setting of sepsis  BP at admission 119/52 Continue to monitor  COPD with acute exacerbation ([place])- (present on admission) Triggered by covid 2L [place] at baseline Requiring 15L NRB with EMS and transitioned to BiPAP on arrival Continue steroid Continue scheduled duoNeb and PRN albuterol Continue [place] Start flutter valve and incentive spirometry when off of BiPAP Continue to monitor  Hyperlipidemia LDL goal <100- (present on admission) Continue statin       Advance  Care Planning:   Code Status: DNR   Consults: None  Family Communication: No family at bedside  Severity of Illness: The appropriate patient status for this patient is INPATIENT. Inpatient status is judged to be reasonable and necessary in order to provide the required intensity of service to ensure the patient's safety. The patient's presenting symptoms, physical exam findings, and initial radiographic and laboratory data in the context of their chronic comorbidities is felt to place them at high risk for further clinical deterioration. Furthermore, it is not anticipated that the patient will be medically stable for discharge from the hospital within 2 midnights of admission.   * I certify that at the point of admission it is my clinical judgment that the patient will require inpatient hospital care spanning beyond 2 midnights from the point of admission due to high intensity of service, high risk for further deterioration and high frequency of surveillance required.*  Author: Rolla Plate, DO 01/06/2022 4:45 AM  For on call review [email or web address].

## 2022-01-06 NOTE — Progress Notes (Addendum)
PROGRESS NOTE  [name] BBC:[id number] DOB: 11/05/1941 DOA: 01/06/2022 PCP: Gerlene Fee, NP  Brief History:  81 year old female with a history of dementia, COPD, chronic respiratory failure on 2 L, hypertension, hyperlipidemia, CKD stage III, OSA on CPAP presenting from [facility] with respiratory distress.  History is unobtainable secondary to the patient's extremis and dementia.  Apparently, the patient was noted to have oxygen saturation of 60% range on 5 L nasal cannula.  She was placed on a nonrebreather initially and subsequently placed on BiPAP in the emergency department.  The patient is awake and alert on BiPAP, but not answering questions or following commands.  Review of the medical record shows that that the patient had molnupiravir on her MAR on 01/04/2022.  Nevertheless, her COVID-19 PCR was positive in the emergency department.  The patient was febrile with a fever of 101.1 and tachycardic in 110s with soft blood pressures in the 100s.  BMP showed sodium 136, potassium 4.0, serum creatinine 1.24.  CBC showed WBC 10.6, hemoglobin 12.0, platelets 229.  Chest x-ray showed bibasilar infiltrates right greater than left.  The patient was given 2.5L fluid and started on ceftriaxone and azithromycin.  She was admitted to the SDU on BiPAP.  She was started on IV Solu-Medrol and remdesivir.   Assessment and Plan: * Severe sepsis with septic shock ([place]) Secondary to pneumonia Presented with tachycardia, fever, respiratory failure and hypotension Lactic acid 3.1 PCT 1.38 Continue ceftriaxone and azithromycin Urine Legionella antigen Urine Streptococcus pneumoniae antigen Follow blood cultures Restart IV fluids Levophed at 3 mcg/kg/min>> wean off as tolerated  Acute on chronic respiratory failure with hypoxia and hypercapnia (HCC) Requires 2L [place] at baseline Arrived satting at 87% on 15L NRB Sats improved on 50% FiO2 BiPAP 2/2 COPD exacerbation and  pneumonia Wean oxygen back to baseline  Pneumonia due to COVID-19 virus- (present on admission) Diagnosed with covid 19 in the outpatient setting Had been on molnupiravir  Unable to take p.o. presently secondary to  unable to p.o. presently secondary to altered mental status and BiPAP PCT 1.38 CRP 1.6>> D-dimer 0.54 Ferritin Start remdesivir Continue Solu-Medrol Follow blood cultures   Lobar pneumonia (HCC) PCT 1.38 Continue ceftriaxone and azithromycin Urine Legionella antigen Urine Streptococcus pneumoniae antigen  COPD with acute exacerbation ([place])- (present on admission) Triggered by covid and pneumonia 2L [place] at baseline Wean off BiPAP as tolerated Continue IV Solu-Medrol Continue scheduled duoNeb and PRN albuterol Continue Brovana Start flutter valve and incentive spirometry when off of BiPAP  CKD (chronic kidney disease) stage 3, GFR 30-59 ml/min (HCC)- (present on admission) Baseline creatinine 0.8-1.0 Daily BMP  Major neurocognitive disorder (HCC) Continue aricept  GERD- (present on admission) Continue PPI  Essential hypertension- (present on admission) Holding amlodipine and verapamil in the setting of hypotension  Hyperlipidemia LDL goal <100- (present on admission) Continue statin         Status is: Inpatient Remains inpatient appropriate because: Severity of illness requiring BiPAP and vasopressors.  Hemodynamic instability as discussed above            Family Communication:  no Family at bedside  Consultants:  noe  Code Status:  DNR  DVT Prophylaxis:  [place] Heparin    Procedures: As Listed in Progress Note Above  Antibiotics: Ceftriaxone 2/21>> Azithromycin 2/21>>  The patient is critically ill with multiple organ systems failure and requires high complexity decision making for assessment and support, frequent evaluation and  titration of therapies, application of advanced monitoring technologies and extensive interpretation  of multiple databases.  Critical care time - 35 mins.      Subjective: Patient is awake on BiPAP.  She is not following commands appropriately.  No reports of vomiting, respiratory distress, uncontrolled pain.  Objective: Vitals:   01/06/22 0435 01/06/22 0500 01/06/22 0600 01/06/22 0617  BP:  (!) 81/42 (!) 109/50   Pulse: 89 95 88   Resp: 15 17 16    Temp:  98.4 F (36.9 C)    TempSrc:  Axillary    SpO2: 93% 91% 97%   Weight:    73.5 kg  Height:        Intake/Output Summary (Last 24 hours) at 01/06/2022 0093 Last data filed at 01/06/2022 0401 Gross per 24 hour  Intake 1400 ml  Output --  Net 1400 ml   Weight change:  Exam:  General:  Pt is alert, does not follows command appropriately, not in acute distress HEENT: No icterus, No thrush, No neck mass, [place]/AT Cardiovascular: RRR, S1/S2, no rubs, no gallops Respiratory: Bibasilar rales but no wheezing.  Good air movement Abdomen: Soft/+BS, non tender, non distended, no guarding Extremities: No edema, No lymphangitis, No petechiae, No rashes, no synovitis   Data Reviewed: I have personally reviewed following labs and imaging studies Basic Metabolic Panel: Recent Labs  Lab 01/04/22 1752 01/06/22 0211 01/06/22 0426  NA 136 136 138  K 3.8 4.0 3.5  CL 103 101 105  CO2 27 25 20*  GLUCOSE 78 160* 148*  BUN 21 26* 25*  CREATININE 0.88 1.24* 1.08*  CALCIUM 9.2 9.0 8.2*  MG  --   --  2.3   Liver Function Tests: Recent Labs  Lab 01/06/22 0211 01/06/22 0426  AST 28 22  ALT 20 16  ALKPHOS 82 64  BILITOT 1.0 0.5  PROT 7.4 6.6  ALBUMIN 4.0 3.2*   No results for input(s): LIPASE, AMYLASE in the last 168 hours. No results for input(s): AMMONIA in the last 168 hours. Coagulation Profile: Recent Labs  Lab 01/06/22 0211 01/06/22 0609  INR 1.0 1.1   CBC: Recent Labs  Lab 01/04/22 1752 01/06/22 0211 01/06/22 0426  WBC 5.8 10.6* 9.4  NEUTROABS  --   --  8.8*  HGB 10.0* 12.0 10.2*  HCT 32.9* 40.6 34.6*  MCV  89.2 89.8 90.3  PLT 188 229 174   Cardiac Enzymes: No results for input(s): CKTOTAL, CKMB, CKMBINDEX, TROPONINI in the last 168 hours. BNP: Invalid input(s): POCBNP CBG: No results for input(s): GLUCAP in the last 168 hours. HbA1C: No results for input(s): HGBA1C in the last 72 hours. Urine analysis:    Component Value Date/Time   COLORURINE YELLOW 06/02/2021 1930   APPEARANCEUR CLEAR 06/02/2021 1930   LABSPEC 1.014 06/02/2021 1930   PHURINE 5.0 06/02/2021 1930   GLUCOSEU NEGATIVE 06/02/2021 1930   HGBUR NEGATIVE 06/02/2021 1930   HGBUR negative 10/30/2010 0822   BILIRUBINUR NEGATIVE 06/02/2021 1930   BILIRUBINUR negative 09/30/2020 1219   BILIRUBINUR small 05/20/2014 1349   KETONESUR NEGATIVE 06/02/2021 1930   PROTEINUR NEGATIVE 06/02/2021 1930   UROBILINOGEN 0.2 09/30/2020 1219   UROBILINOGEN 0.2 06/22/2014 2250   NITRITE NEGATIVE 06/02/2021 1930   LEUKOCYTESUR NEGATIVE 06/02/2021 1930   Sepsis Labs: @LABRCNTIP (procalcitonin:4,lacticidven:4) ) Recent Results (from the past 240 hour(s))  Resp Panel by RT-PCR (Flu A&B, Covid) Nasopharyngeal Swab     Status: Abnormal   Collection Time: 01/06/22  2:01 AM   Specimen: Nasopharyngeal Swab;  Nasopharyngeal(NP) swabs in vial transport medium  Result Value Ref Range Status   SARS Coronavirus 2 by RT PCR POSITIVE (A) NEGATIVE Final    Comment: (NOTE) SARS-CoV-2 target nucleic acids are DETECTED.  The SARS-CoV-2 RNA is generally detectable in upper respiratory specimens during the acute phase of infection. Positive results are indicative of the presence of the identified virus, but do not rule out bacterial infection or co-infection with other pathogens not detected by the test. Clinical correlation with patient history and other diagnostic information is necessary to determine patient infection status. The expected result is Negative.  Fact Sheet for Patients: EntrepreneurPulse.com.au  Fact Sheet for  Healthcare Providers: IncredibleEmployment.be  This test is not yet approved or cleared by the [place] FDA and  has been authorized for detection and/or diagnosis of SARS-CoV-2 by FDA under an Emergency Use Authorization (EUA).  This EUA will remain in effect (meaning this test can be used) for the duration of  the COVID-19 declaration under Section 564(b)(1) of the A ct, 21 U.S.C. section 360bbb-3(b)(1), unless the authorization is terminated or revoked sooner.     Influenza A by PCR NEGATIVE NEGATIVE Final   Influenza B by PCR NEGATIVE NEGATIVE Final    Comment: (NOTE) The Xpert Xpress SARS-CoV-2/FLU/RSV plus assay is intended as an aid in the diagnosis of influenza from Nasopharyngeal swab specimens and should not be used as a sole basis for treatment. Nasal washings and aspirates are unacceptable for Xpert Xpress SARS-CoV-2/FLU/RSV testing.  Fact Sheet for Patients: EntrepreneurPulse.com.au  Fact Sheet for Healthcare Providers: IncredibleEmployment.be  This test is not yet approved or cleared by the [place] FDA and has been authorized for detection and/or diagnosis of SARS-CoV-2 by FDA under an Emergency Use Authorization (EUA). This EUA will remain in effect (meaning this test can be used) for the duration of the COVID-19 declaration under Section 564(b)(1) of the Act, 21 U.S.C. section 360bbb-3(b)(1), unless the authorization is terminated or revoked.  Performed at [facility], [address], [place], [place] [address]   Blood culture (routine x 2)     Status: None (Preliminary result)   Collection Time: 01/06/22  2:11 AM   Specimen: BLOOD  Result Value Ref Range Status   Specimen Description BLOOD BLOOD RIGHT FOREARM  Final   Special Requests   Final    BOTTLES DRAWN AEROBIC AND ANAEROBIC Blood Culture adequate volume   Culture   Final    NO GROWTH < 12 HOURS Performed at [facility], [address], [place], [place] [address]    Report Status PENDING  Incomplete  Blood culture (routine x 2)     Status: None (Preliminary result)   Collection Time: 01/06/22  2:11 AM   Specimen: BLOOD  Result Value Ref Range Status   Specimen Description BLOOD BLOOD LEFT FOREARM  Final   Special Requests   Final    BOTTLES DRAWN AEROBIC AND ANAEROBIC Blood Culture adequate volume   Culture   Final    NO GROWTH < 12 HOURS Performed at [facility], [address] [place], [place] [address]    Report Status PENDING  Incomplete     Scheduled Meds:  arformoterol  15 mcg Nebulization BID   vitamin C  500 mg Oral BID   atorvastatin  10 mg Oral Daily   Chlorhexidine Gluconate Cloth  6 each Topical Daily   donepezil  10 mg Oral QHS   feeding supplement  237 mL Oral BID BM   heparin  5,000 Units Subcutaneous Q8H   ipratropium-albuterol  3 mL Nebulization Q6H   methylPREDNISolone (SOLU-MEDROL) injection  125 mg Intravenous Daily   mirabegron ER  50 mg Oral QPM   pantoprazole (PROTONIX) IV  40 mg Intravenous Q24H   zinc sulfate  220 mg Oral Daily   Continuous Infusions:  sodium chloride     0.9 % NaCl with KCl 20 mEq / L     azithromycin Stopped (01/06/22 0322)   [START ON 01/07/2022] cefTRIAXone (ROCEPHIN)  IV     norepinephrine (LEVOPHED) Adult infusion 2 mcg/min (01/06/22 0601)   remdesivir 100 mg in NS 100 mL     [START ON 01/07/2022] remdesivir 100 mg in NS 100 mL     sodium chloride      Procedures/Studies: DG Chest Port 1 View  Result Date: 01/06/2022 CLINICAL DATA:  Respiratory distress. EXAM: PORTABLE CHEST 1 VIEW COMPARISON:  January 28, 2017 FINDINGS: The lungs are hyperinflated. Mild infiltrates are seen within the bilateral lung bases, right greater than left. There is no evidence of a pleural effusion or pneumothorax. The heart size and mediastinal contours are within normal limits. Multilevel degenerative changes are noted throughout the thoracic spine. IMPRESSION: Mild bibasilar  infiltrates, right greater than left. Electronically Signed   By: Virgina Norfolk M.D.   On: 01/06/2022 02:23    Orson Eva, DO  [facility]  If 7PM-7AM, please contact night-coverage [email or web address] Password [email or web address] 01/06/2022, 8:12 AM   LOS: 0 days

## 2022-01-06 NOTE — Assessment & Plan Note (Addendum)
Continue aricept 

## 2022-01-06 NOTE — NC FL2 (Signed)
[place] LEVEL OF CARE SCREENING TOOL     IDENTIFICATION  Patient Name: Gabriella SALTZ Birthdate: 05-31-1941 Sex: female Admission Date (Current Location): 01/06/2022  [facility] and [place] Number:  [place] and Address:         Provider Number: [id number]  Attending Physician Name and Address:  Orson Eva, MD  Relative Name and Phone Number:  Roshanna, Cimino (Daughter)   [phone]    Current Level of Care: Hospital Recommended Level of Care: [place] Prior Approval Number:    Date Approved/Denied:   PASRR Number:    Discharge Plan: SNF    Current Diagnoses: Patient Active Problem List   Diagnosis Date Noted   Acute on chronic respiratory failure with hypoxia and hypercapnia ([place]) 01/06/2022   Pneumonia due to COVID-19 virus 01/06/2022   Lobar pneumonia ([place]) 01/06/2022   COVID-19 with pulmonary comorbidity 01/06/2022   Hypokalemia 07/27/2021   OAB (overactive bladder) 04/07/2021   Protein-calorie malnutrition, severe ([place]) 03/31/2021   CKD (chronic kidney disease) stage 3, GFR 30-59 ml/min ([place]) 03/27/2021   Low thyroid stimulating hormone (TSH) level 03/27/2021   Insomnia 11/14/2020   Age related osteoporosis 08/29/2020   Major depression in partial remission ([place]) 12/30/2018   COPD with hypoxia ([place]) 07/02/2017   Demand ischemia ([place]) 01/30/2017   Severe sepsis with septic shock ([place]) 01/28/2017   Major neurocognitive disorder ([place]) 01/28/2017   Cervical neck pain with evidence of disc disease 05/20/2014   Multiple adenomatous polyps 02/15/2013   Spinal stenosis 09/29/2011   Hyperlipidemia LDL goal <100 01/25/2007   Normocytic anemia 10/05/2006   COPD with acute exacerbation ([place]) 10/05/2006   Obstructive sleep apnea 10/05/2006   Essential hypertension 10/05/2006   GERD 10/05/2006   Generalized osteoarthritis of multiple sites 10/05/2006    Orientation RESPIRATION BLADDER Height & Weight      Self, Time, Situation, Place  Normal Continent Weight: 162 lb 0.6 oz (73.5 kg) Height:  6\' 1"  (185.4 cm)  BEHAVIORAL SYMPTOMS/MOOD NEUROLOGICAL BOWEL NUTRITION STATUS      Continent Diet (regular)  AMBULATORY STATUS COMMUNICATION OF NEEDS Skin   Limited Assist Verbally Normal                       Personal Care Assistance Level of Assistance  Bathing, Dressing, Feeding Bathing Assistance: Limited assistance Feeding assistance: Independent Dressing Assistance: Limited assistance     Functional Limitations Info  Sight, Hearing, Speech Sight Info: Adequate Hearing Info: Adequate Speech Info: Adequate    SPECIAL CARE FACTORS FREQUENCY                       Contractures Contractures Info: Not present    Additional Factors Info  Code Status, Allergies, Isolation Precautions Code Status Info: DNR Allergies Info: Fenofibrate, pravastatin sodium, sulfonamide derivatives, ciprofloxacin     Isolation Precautions Info: COVID+ on 01/06/22     Current Medications (01/06/2022):  This is the current hospital active medication list Current Facility-Administered Medications  Medication Dose Route Frequency Provider Last Rate Last Admin   0.9 %  sodium chloride infusion  250 mL Intravenous Continuous Zierle-Ghosh, Asia B, DO       0.9 % NaCl with KCl 20 mEq/ L  infusion   Intravenous Continuous Tat, David, MD 100 mL/hr at 01/06/22 1652 Infusion Verify at 01/06/22 1652   acetaminophen (TYLENOL) tablet 650 mg  650 mg Oral Q6H PRN Zierle-Ghosh, Asia B, DO  Or   acetaminophen (TYLENOL) suppository 650 mg  650 mg Rectal Q6H PRN Zierle-Ghosh, Asia B, DO       albuterol ([name]) (2.5 MG/3ML) 0.083% nebulizer solution 2.5 mg  2.5 mg Nebulization Q2H PRN Zierle-Ghosh, Asia B, DO       arformoterol (BROVANA) nebulizer solution 15 mcg  15 mcg Nebulization BID Zierle-Ghosh, Asia B, DO   15 mcg at 01/06/22 7829   ascorbic acid (VITAMIN C) tablet 500 mg  500 mg Oral BID  Zierle-Ghosh, Asia B, DO   500 mg at 01/06/22 1031   atorvastatin (LIPITOR) tablet 10 mg  10 mg Oral Daily Zierle-Ghosh, Asia B, DO   10 mg at 01/06/22 1031   azithromycin (ZITHROMAX) 500 mg in sodium chloride 0.9 % 250 mL IVPB  500 mg Intravenous Q24H Zierle-Ghosh, Asia B, DO   Stopped at 01/06/22 0322   [START ON 01/07/2022] cefTRIAXone (ROCEPHIN) 2 g in sodium chloride 0.9 % 100 mL IVPB  2 g Intravenous Q24H Zierle-Ghosh, Asia B, DO       Chlorhexidine Gluconate Cloth 2 % PADS 6 each  6 each Topical Daily Zierle-Ghosh, Asia B, DO   6 each at 01/06/22 1322   chlorpheniramine-HYDROcodone 10-8 MG/5ML suspension 5 mL  5 mL Oral Q12H PRN Zierle-Ghosh, Asia B, DO       donepezil (ARICEPT) tablet 10 mg  10 mg Oral QHS Zierle-Ghosh, Asia B, DO       feeding supplement (ENSURE ENLIVE / ENSURE PLUS) liquid 237 mL  237 mL Oral BID BM Zierle-Ghosh, Asia B, DO   237 mL at 01/06/22 1030   guaiFENesin-dextromethorphan (ROBITUSSIN DM) 100-10 MG/5ML syrup 10 mL  10 mL Oral Q4H PRN Zierle-Ghosh, Asia B, DO       heparin injection 5,000 Units  5,000 Units Subcutaneous Q8H Zierle-Ghosh, Asia B, DO   5,000 Units at 01/06/22 1333   [START ON 01/07/2022] ipratropium-albuterol (DUONEB) 0.5-2.5 (3) MG/3ML nebulizer solution 3 mL  3 mL Nebulization BID Tat, David, MD       methylPREDNISolone sodium succinate (SOLU-MEDROL) 125 mg/2 mL injection 125 mg  125 mg Intravenous Daily Zierle-Ghosh, Asia B, DO   125 mg at 01/06/22 0855   mirabegron ER (MYRBETRIQ) tablet 50 mg  50 mg Oral QPM Zierle-Ghosh, Asia B, DO       norepinephrine (LEVOPHED) 4mg  in 277mL (0.016 mg/mL) premix infusion  2-10 mcg/min Intravenous Titrated Zierle-Ghosh, Asia B, DO   Stopped at 01/06/22 0901   ondansetron (ZOFRAN) tablet 4 mg  4 mg Oral Q6H PRN Zierle-Ghosh, Asia B, DO       Or   ondansetron (ZOFRAN) injection 4 mg  4 mg Intravenous Q6H PRN Zierle-Ghosh, Asia B, DO       pantoprazole (PROTONIX) injection 40 mg  40 mg Intravenous Q24H Tat, Shanon Brow, MD    40 mg at 01/06/22 0855   [START ON 01/07/2022] remdesivir 100 mg in sodium chloride 0.9 % 100 mL IVPB  100 mg Intravenous Daily Tat, David, MD       traMADol Veatrice [place]) tablet 50 mg  50 mg Oral Q6H PRN Zierle-Ghosh, Asia B, DO       zinc sulfate capsule 220 mg  220 mg Oral Daily Zierle-Ghosh, Asia B, DO   220 mg at 01/06/22 1031     Discharge Medications: Please see discharge summary for a list of discharge medications.  Relevant Imaging Results:  Relevant Lab Results:   Additional Information    Henriette Hesser, Clydene Pugh, LCSW

## 2022-01-06 NOTE — Progress Notes (Signed)
Pt transferred from ED 1 to ICU 8 on BIPAP with no complications

## 2022-01-06 NOTE — Assessment & Plan Note (Signed)
Continue PPI ?

## 2022-01-06 NOTE — Assessment & Plan Note (Addendum)
Holding amlodipine and verapamil in the setting of hypotension initially Restart lower dose verapamil Will no restart amlodipine

## 2022-01-06 NOTE — TOC Initial Note (Signed)
Transition of Care [facility]) - Initial/Assessment Note    Patient Details  Name: [name] MRN: [id number] Date of Birth: 12/07/1940  [facility]) CM/SW Contact:    Ihor Gully, LCSW Phone Number: 01/06/2022, 4:34 PM  Clinical Narrative:                 Patient is a LTC resident at [facility]. Admitted COVID+. Feeds self. Self-propels in wc throughout building. Can return to facility at d/c.  Expected Discharge Plan: Skilled Nursing Facility Barriers to Discharge: Continued Medical Work up   Patient Goals and CMS Choice Patient states their goals for this hospitalization and ongoing recovery are:: return to SNF      Expected Discharge Plan and Services Expected Discharge Plan: [place]       Living arrangements for the past 2 months: [place]                                      Prior Living Arrangements/Services Living arrangements for the past 2 months: [place] Lives with:: Facility Resident Patient language and need for interpreter reviewed:: Yes Do you feel safe going back to the place where you live?: Yes      Need for Family Participation in Patient Care: Yes (Comment) Care giver support system in place?: Yes (comment)   Criminal Activity/Legal Involvement Pertinent to Current Situation/Hospitalization: No - Comment as needed  Activities of Daily Living Home Assistive Devices/Equipment: Wheelchair, Shower chair with back ADL Screening (condition at time of admission) Patient's cognitive ability adequate to safely complete daily activities?: No Is the patient deaf or have difficulty hearing?: No Does the patient have difficulty seeing, even when wearing glasses/contacts?: No Does the patient have difficulty concentrating, remembering, or making decisions?: Yes Patient able to express need for assistance with ADLs?: Yes Does the patient have difficulty dressing or bathing?:  Yes Independently performs ADLs?: No Communication: Independent Dressing (OT): Needs assistance Is this a change from baseline?: Pre-admission baseline Grooming: Needs assistance Is this a change from baseline?: Pre-admission baseline Feeding: Needs assistance Is this a change from baseline?: Pre-admission baseline Bathing: Needs assistance Is this a change from baseline?: Pre-admission baseline Toileting: Needs assistance Is this a change from baseline?: Pre-admission baseline In/Out Bed: Needs assistance Is this a change from baseline?: Pre-admission baseline Walks in Home: Needs assistance Is this a change from baseline?: Pre-admission baseline Does the patient have difficulty walking or climbing stairs?: Yes Weakness of Legs: Both Weakness of Arms/Hands: Both  Permission Sought/Granted Permission sought to share information with : Family Supports    Share Information with NAME: Keri, Education officer, museum at [place] (typically observed): Appropriate Orientation: : Oriented to Self, Oriented to  Time, Oriented to Place, Oriented to Situation Alcohol / Substance Use: Not Applicable Psych Involvement: No (comment)  Admission diagnosis:  Acute respiratory failure with hypoxia ([place]) [J96.01] Patient Active Problem List   Diagnosis Date Noted   Acute on chronic respiratory failure with hypoxia and hypercapnia ([place]) 01/06/2022   Pneumonia due to COVID-19 virus 01/06/2022   Lobar pneumonia ([place]) 01/06/2022   COVID-19 with pulmonary comorbidity 01/06/2022   Hypokalemia 07/27/2021   OAB (overactive bladder) 04/07/2021   Protein-calorie malnutrition, severe ([place]) 03/31/2021   CKD (chronic kidney disease) stage 3, GFR 30-59 ml/min ([place]) 03/27/2021  Low thyroid stimulating hormone (TSH) level 03/27/2021   Insomnia 11/14/2020   Age related osteoporosis 08/29/2020   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/2018   COPD with hypoxia ([place])  07/02/2017   Demand ischemia ([place]) 01/30/2017   Severe sepsis with septic shock ([place]) 01/28/2017   Major neurocognitive disorder ([place]) 01/28/2017   Cervical neck pain with evidence of disc disease 05/20/2014   Multiple adenomatous polyps 02/15/2013   Spinal stenosis 09/29/2011   Hyperlipidemia LDL goal <100 01/25/2007   Normocytic anemia 10/05/2006   COPD with acute exacerbation ([place]) 10/05/2006   Obstructive sleep apnea 10/05/2006   Essential hypertension 10/05/2006   GERD 10/05/2006   Generalized osteoarthritis of multiple sites 10/05/2006   PCP:  Gerlene Fee, NP Pharmacy:   [facility] [place] [facility] [place] [address] [place] [address] Phone: [phone] Fax: [phone]  [facility] [place], [place] [address] Phone: [phone] Fax: [place] [place] [address] [place] [address] Phone: [phone] Fax: [phone]     Social Determinants of Health ([place]) Interventions    Readmission Risk Interventions No flowsheet data found.

## 2022-01-06 NOTE — Progress Notes (Signed)
Pt tolerating well on 2L [place] at this time. SPO2 98% RR 16 No increased WOB noted. RT will continue to monitor

## 2022-01-06 NOTE — Assessment & Plan Note (Addendum)
Triggered by covid and pneumonia 2L [place] at baseline Wean off BiPAP as tolerated Continue IV Solu-Medrol Continue scheduled duoNeb and PRN albuterol Continue Brovana Start flutter valve and incentive spirometry when off of BiPAP

## 2022-01-06 NOTE — Assessment & Plan Note (Deleted)
1. Sepsis secondary to pneumonia 1. SIRS criteria: tachycardia, tachypnea, respiratory failure, lactic acidosis, AKI 2. Most likely source of infection is Covid 19, but concern for superimposed bacterial pneumonia- procalcitonin pending 3. Life threatening organ dysfunction 2/2 infection is evidenced by: respiratory failure and lactic acidosis 4. Antibiotics started Rocephin and zithromax 5. Fluids given prior to admission 2L boluses, patient did become hypotensive when she arrived to the ICU.  Another 500 mL bolus given at that time and low-dose peripheral Levophed started 6. Sepsis order set utilized 7. Monitor this patient on Stepdown 8. Blood cultures and sputum cultures pending

## 2022-01-06 NOTE — ED Provider Notes (Signed)
[facility] Emergency Department Provider Note MRN:  [id number]  Arrival date & time: 01/06/22     Chief Complaint   Respiratory Distress   History of Present Illness   [name] is a 81 y.o. year-old female with a history of COPD presenting to the ED with chief complaint of respiratory distress.  Patient arriving via EMS from care facility.  Reportedly was in respiratory distress, saturations in the 60s on 5 L nasal cannula.  Placed on nonrebreather and brought here.  Has COVID.  Review of Systems  A thorough review of systems was obtained and all systems are negative except as noted in the HPI and PMH.   Patient's Health History    Past Medical History:  Diagnosis Date   Abnormality of gait 06/06/2014   Allergic rhinitis    Anemia    Ankle fracture, right    Anxiety    Arm fracture, left    Arthritis    abnormal gait    COPD (chronic obstructive pulmonary disease) (HCC)    chronic CO2 retention, decreased DLCO    Cystic acne    adult    Degenerative disc disease, cervical    syrinx C3-7   Degenerative disc disease, lumbar    L5 nerve impingement    Dementia (HCC)    Depression    DNR (do not resuscitate)    Fracture of right proximal fibula 07/13/18 09/05/2018   GERD (gastroesophageal reflux disease)    Heme positive stool 10/10/2017   History of colon surgery    right hemicolectomy for high-grade adenomas in right colon 2013   Hyperglycemia    Hypertension    Low back pain    Mediastinal lymphadenopathy 1/9   resolving    Memory difficulty 09/20/2014   mild dementia   Onychomycosis    OSA on CPAP    Sleep apnea    stop bang score 6    Past Surgical History:  Procedure Laterality Date   ABDOMINAL HYSTERECTOMY  1976   secondary to bleeding    APPENDECTOMY     COLON RESECTION  08/18/2012   Procedure: HAND ASSISTED LAPAROSCOPIC COLON RESECTION;  Surgeon: Jamesetta So, MD;  Location: [facility];  Service: General;  Laterality:  N/A;   COLONOSCOPY  07/20/2012   Dr. Gala Romney: multiple colonic polyps with large polyps on right side, s/p saline-assisted debulking piecemeal polypectomy and ablation. Not all removed. Path with tubulovillous adenomas, high grade dysplasia.    COLONOSCOPY N/A 03/09/2013   WVP:XTGGYIR polyps-tubular adenomas. S/p right hemicolectomy. next tcs 02/2018   epidural steroids     ESOPHAGOGASTRODUODENOSCOPY (EGD) WITH ESOPHAGEAL DILATION N/A 02/06/2014   Procedure: ESOPHAGOGASTRODUODENOSCOPY (EGD) WITH ESOPHAGEAL DILATION;  Surgeon: Daneil Dolin, MD;  Location: [facility];  Service: Endoscopy;  Laterality: N/A;  9:30   OOPHORECTOMY  1976   ORIF ANKLE FRACTURE  01/18/2012   Procedure: OPEN REDUCTION INTERNAL FIXATION (ORIF) ANKLE FRACTURE;  Surgeon: Arther Abbott, MD;  Location: [facility];  Service: Orthopedics;  Laterality: Left;   ORIF ANKLE FRACTURE Right 01/13/2016   Procedure: OPEN REDUCTION INTERNAL FIXATION (ORIF) ANKLE FRACTURE;  Surgeon: Carole Civil, MD;  Location: [facility];  Service: Orthopedics;  Laterality: Right;    Family History  Problem Relation Age of Onset   Stomach cancer Father    Cancer Father    Cancer Mother        brain    Breast cancer Mother    Arthritis Other  Social History   Socioeconomic History   Marital status: Widowed    Spouse name: Not on file   Number of children: 4   Years of education: college 1   Highest education level: Not on file  Occupational History   Occupation: employed in the tobacco industry     Employer: RETIRED   Occupation: retired  Tobacco Use   Smoking status: Former    Packs/day: 1.50    Years: 40.00    Pack years: 60.00    Types: Cigarettes    Quit date: 06/21/2006    Years since quitting: 15.5   Smokeless tobacco: Never  Vaping Use   Vaping Use: Never used  Substance and Sexual Activity   Alcohol use: No   Drug use: No   Sexual activity: Not Currently  Other Topics Concern   Not on file  Social History Narrative    Long term resident of [facility]    Social Determinants of Health   Financial Resource Strain: Not on file  Food Insecurity: Not on file  Transportation Needs: Not on file  Physical Activity: Not on file  Stress: Not on file  Social Connections: Not on file  Intimate Partner Violence: Not on file     Physical Exam   Vitals:   01/06/22 0500 01/06/22 0600  BP: (!) 81/42 (!) 109/50  Pulse: 95 88  Resp: 17 16  Temp: 98.4 F (36.9 C)   SpO2: 91% 97%    CONSTITUTIONAL: Ill-appearing, moderate respiratory distress NEURO/PSYCH: Somnolent but wakes EYES:  eyes equal and reactive ENT/NECK:  no LAD, no JVD CARDIO: Tachycardic rate, well-perfused, normal S1 and S2 PULM: Poor air movement, tachypneic, accessory muscle use GI/GU:  non-distended, non-tender MSK/SPINE:  No gross deformities, no edema SKIN:  no rash, atraumatic   *Additional and/or pertinent findings included in MDM below  Diagnostic and Interventional Summary    EKG Interpretation  Date/Time:  Wednesday January 06 2022 01:53:12 EST Ventricular Rate:  114 PR Interval:  154 QRS Duration: 69 QT Interval:  335 QTC Calculation: 462 R Axis:   31 Text Interpretation: Sinus tachycardia Anterior infarct, old Minimal ST depression, diffuse leads Confirmed by Gerlene Fee [phone]) on 01/06/2022 2:53:19 AM       Labs Reviewed  RESP PANEL BY RT-PCR (FLU A&B, COVID) ARPGX2 - Abnormal; Notable for the following components:      Result Value   SARS Coronavirus 2 by RT PCR POSITIVE (*)    All other components within normal limits  CBC - Abnormal; Notable for the following components:   WBC 10.6 (*)    MCHC 29.6 (*)    All other components within normal limits  COMPREHENSIVE METABOLIC PANEL - Abnormal; Notable for the following components:   Glucose, Bld 160 (*)    BUN 26 (*)    Creatinine, Ser 1.24 (*)    GFR, Estimated 44 (*)    All other components within normal limits  LACTIC ACID, PLASMA - Abnormal; Notable for the  following components:   Lactic Acid, Venous 3.1 (*)    All other components within normal limits  BLOOD GAS, VENOUS - Abnormal; Notable for the following components:   pO2, Ven 73 (*)    All other components within normal limits  LACTIC ACID, PLASMA - Abnormal; Notable for the following components:   Lactic Acid, Venous 3.1 (*)    All other components within normal limits  COMPREHENSIVE METABOLIC PANEL - Abnormal; Notable for the following components:   CO2 20 (*)  Glucose, Bld 148 (*)    BUN 25 (*)    Creatinine, Ser 1.08 (*)    Calcium 8.2 (*)    Albumin 3.2 (*)    GFR, Estimated 52 (*)    All other components within normal limits  CBC WITH DIFFERENTIAL/PLATELET - Abnormal; Notable for the following components:   RBC 3.83 (*)    Hemoglobin 10.2 (*)    HCT 34.6 (*)    MCHC 29.5 (*)    Neutro Abs 8.8 (*)    Lymphs Abs 0.3 (*)    All other components within normal limits  TROPONIN I (HIGH SENSITIVITY) - Abnormal; Notable for the following components:   Troponin I (High Sensitivity) 28 (*)    All other components within normal limits  CULTURE, BLOOD (ROUTINE X 2)  CULTURE, BLOOD (ROUTINE X 2)  MRSA NEXT GEN BY PCR, NASAL  EXPECTORATED SPUTUM ASSESSMENT W GRAM STAIN, RFLX TO RESP C  BRAIN NATRIURETIC PEPTIDE  PROTIME-INR  PROCALCITONIN  MAGNESIUM  CORTISOL-AM, BLOOD  PROTIME-INR  TROPONIN I (HIGH SENSITIVITY)    DG Chest Port 1 View  Final Result      Medications  azithromycin (ZITHROMAX) 500 mg in sodium chloride 0.9 % 250 mL IVPB (0 mg Intravenous Stopped 01/06/22 0322)  baricitinib (OLUMIANT) tablet 2 mg (0 mg Oral Hold 01/06/22 0548)  atorvastatin (LIPITOR) tablet 10 mg (has no administration in time range)  donepezil (ARICEPT) tablet 10 mg (has no administration in time range)  pantoprazole (PROTONIX) EC tablet 40 mg (has no administration in time range)  mirabegron ER (MYRBETRIQ) tablet 50 mg (has no administration in time range)  ascorbic acid (VITAMIN C)  tablet 500 mg (has no administration in time range)  arformoterol (BROVANA) nebulizer solution 15 mcg (has no administration in time range)  heparin injection 5,000 Units (5,000 Units Subcutaneous Given 01/06/22 0537)  cefTRIAXone (ROCEPHIN) 2 g in sodium chloride 0.9 % 100 mL IVPB (has no administration in time range)  acetaminophen (TYLENOL) tablet 650 mg (has no administration in time range)    Or  acetaminophen (TYLENOL) suppository 650 mg (has no administration in time range)  traMADol (ULTRAM) tablet 50 mg (has no administration in time range)  ondansetron (ZOFRAN) tablet 4 mg (has no administration in time range)    Or  ondansetron (ZOFRAN) injection 4 mg (has no administration in time range)  ipratropium-albuterol (DUONEB) 0.5-2.5 (3) MG/3ML nebulizer solution 3 mL (has no administration in time range)  albuterol ([name]) (2.5 MG/3ML) 0.083% nebulizer solution 2.5 mg (has no administration in time range)  methylPREDNISolone sodium succinate (SOLU-MEDROL) 125 mg/2 mL injection 125 mg (has no administration in time range)  guaiFENesin-dextromethorphan (ROBITUSSIN DM) 100-10 MG/5ML syrup 10 mL (has no administration in time range)  chlorpheniramine-HYDROcodone 10-8 MG/5ML suspension 5 mL (has no administration in time range)  zinc sulfate capsule 220 mg (has no administration in time range)  Chlorhexidine Gluconate Cloth 2 % PADS 6 each (has no administration in time range)  0.9 %  sodium chloride infusion (250 mLs Intravenous Not Given 01/06/22 0548)  norepinephrine (LEVOPHED) 4mg  in 214mL (0.016 mg/mL) premix infusion (2 mcg/min Intravenous New Bag/Given 01/06/22 0601)  feeding supplement (ENSURE ENLIVE / ENSURE PLUS) liquid 237 mL (has no administration in time range)  ipratropium-albuterol (DUONEB) 0.5-2.5 (3) MG/3ML nebulizer solution 3 mL (3 mLs Nebulization Given 01/06/22 0226)  methylPREDNISolone sodium succinate (SOLU-MEDROL) 125 mg/2 mL injection 125 mg (125 mg Intravenous Given  01/06/22 0158)  ipratropium-albuterol (DUONEB) 0.5-2.5 (3) MG/3ML nebulizer solution 3  mL (3 mLs Nebulization Given 01/06/22 0217)  magnesium sulfate IVPB 2 g 50 mL (0 g Intravenous Stopped 01/06/22 0258)  sodium chloride 0.9 % bolus 1,000 mL (0 mLs Intravenous Stopped 01/06/22 0401)  cefTRIAXone (ROCEPHIN) 2 g in sodium chloride 0.9 % 100 mL IVPB (0 g Intravenous Stopped 01/06/22 0237)  lactated ringers bolus 1,000 mL (1,000 mLs Intravenous New Bag/Given 01/06/22 0340)  sodium chloride 0.9 % bolus 500 mL (500 mLs Intravenous Bolus from Bag 01/06/22 0538)     Procedures  /  Critical Care .Critical Care Performed by: Maudie Flakes, MD Authorized by: Maudie Flakes, MD   Critical care provider statement:    Critical care time (minutes):  34   Critical care was necessary to treat or prevent imminent or life-threatening deterioration of the following conditions:  Respiratory failure   Critical care was time spent personally by me on the following activities:  Development of treatment plan with patient or surrogate, discussions with consultants, evaluation of patient's response to treatment, examination of patient, ordering and review of laboratory studies, ordering and review of radiographic studies, ordering and performing treatments and interventions, pulse oximetry, re-evaluation of patient's condition and review of old charts  ED Course and Medical Decision Making  Initial Impression and Ddx Suspect COPD exacerbation, patient is febrile and so also concern for pulmonary sepsis.  Or simply severe COVID-19.  Providing empiric fluids, antibiotics, COPD management.  Patient on BiPAP  Past medical/surgical history that increases complexity of ED encounter: COPD  Interpretation of Diagnostics I personally reviewed the EKG and my interpretation is as follows: Sinus rhythm    Labs overall reassuring with no significant blood count or electrolyte disturbance  Patient Reassessment and Ultimate  Disposition/Management Patient much improved on BiPAP, admitted to medicine.  Patient management required discussion with the following services or consulting groups:  Hospitalist Service  Complexity of Problems Addressed Acute illness or injury that poses threat of life of bodily function  Additional Data Reviewed and Analyzed Further history obtained from: EMS on arrival  Additional Factors Impacting ED Encounter Risk Consideration of hospitalization and Involvement or initiation of DNR  Barth Kirks. Sedonia Small, [place] [email or web address]  Final Clinical Impressions(s) / ED Diagnoses     ICD-10-CM   1. Acute respiratory failure with hypoxia (HCC)  J96.01       ED Discharge Orders     None        Discharge Instructions Discussed with and Provided to Patient:   Discharge Instructions   None      Maudie Flakes, MD 01/06/22 [phone]

## 2022-01-06 NOTE — ED Notes (Signed)
Blood cultures obtained before antibiotics given

## 2022-01-06 NOTE — Assessment & Plan Note (Addendum)
Diagnosed with covid 19 in the outpatient setting Had been on molnupiravir  Unable to take p.o.initially due to AMS and BiPAP PCT 1.38<<3.04 CRP 1.6>>21.4 D-dimer 0.54>>0.92 Ferritin 155>>171 Continue remdesivir D#3;  Received 3 days during hospitalization Continue Solu-Medrol>>d/c with 5 more days prednisone Follow blood cultures

## 2022-01-06 NOTE — Assessment & Plan Note (Addendum)
Requires 2L [place] at baseline Arrived satting at 87% on 15L NRB Sats improved on 50% FiO2 BiPAP Weaned to 4L  currently 2/2 COPD exacerbation and pneumonia Wean oxygen back to baseline

## 2022-01-06 NOTE — Assessment & Plan Note (Addendum)
Baseline creatinine 0.7-1.0 Daily BMP

## 2022-01-06 NOTE — Progress Notes (Signed)
Initial Nutrition Assessment  DOCUMENTATION CODES:      INTERVENTION:  Ensure Enlive po BID, each supplement provides 350 kcal and 20 grams of protein.   Regular diet   NUTRITION DIAGNOSIS:   Increased nutrient needs related to acute illness (COVID-19 infection) as evidenced by estimated needs.   GOAL:  Provide needs based on ASPEN/SCCM guidelines   MONITOR:  PO intake, Supplement acceptance, Labs, Weight trends  REASON FOR ASSESSMENT:   Malnutrition Screening Tool    ASSESSMENT: Patient is a 81 yo female with hx of dementia, COPD (chronic O2@2L ), CKD 3, GERD who presents from [facility] with acute on chronic respiratory failure, COVID-19 positive and requiring 15L- NRB.    Patient able to feed herself at baseline. Usual diet: Regular and intake 75-100% of most meals. Family also provides favorite foods to support intake. Current intake unknown. Patient is awake and interacting with staff.   Facility weight 2/3: 167.4 lb is within 2 lbs of wt 3 and 6 months ago.  Present weight 73.5 kg (161.7 lb). Acute loss 5.7 lb (3.5%) change.   Medications: Vitamin C, Lipitor, Aricept, Solumedrol, Zinc, Protonix. Levophed.    Labs reviewed: BUN 25, Cr 1.08, Glucose 148. Hgb 10.2 (L). Lactic acid 3.1 (H). Vitamin D 63.3- wnl.  NUTRITION - FOCUSED PHYSICAL EXAM: Nutrition-Focused physical exam completed. Findings are mild upper arm, buccal fat depletion, mild clavicle and temporal muscle depletion, and mild edema.     Diet Order:   Diet Order             Diet regular Room service appropriate? Yes; Fluid consistency: Thin  Diet effective now                   EDUCATION NEEDS:  Not appropriate for education at this time  Skin:  Skin Assessment: Reviewed RN Assessment  Last BM:  unknown  Height:   Ht Readings from Last 1 Encounters:  01/06/22 6\' 1"  (1.854 m)    Weight:   Wt Readings from Last 1 Encounters:  01/06/22 73.5 kg    Ideal Body Weight:   75 kg  BMI:  Body  mass index is 21.38 kg/m.  Estimated Nutritional Needs:   Kcal:  1800-2000  Protein:  95-100 gr  Fluid:  1.8 liters daily  Colman Cater MS,RD,CSG,LDN Contact: Shea Evans

## 2022-01-06 NOTE — Hospital Course (Signed)
81 year old female with a history of dementia, COPD, chronic respiratory failure on 2 L, hypertension, hyperlipidemia, CKD stage III, OSA on CPAP presenting from [facility] with respiratory distress.  History is unobtainable secondary to the patient's extremis and dementia.  Apparently, the patient was noted to have oxygen saturation of 60% range on 5 L nasal cannula.  She was placed on a nonrebreather initially and subsequently placed on BiPAP in the emergency department.  The patient is awake and alert on BiPAP, but not answering questions or following commands.  Review of the medical record shows that that the patient had molnupiravir on her MAR on 01/04/2022.  Nevertheless, her COVID-19 PCR was positive in the emergency department.  The patient was febrile with a fever of 101.1 and tachycardic in 110s with soft blood pressures in the 100s.  BMP showed sodium 136, potassium 4.0, serum creatinine 1.24.  CBC showed WBC 10.6, hemoglobin 12.0, platelets 229.  Chest x-ray showed bibasilar infiltrates right greater than left.  The patient was given 2.5L fluid and started on ceftriaxone and azithromycin.  She was admitted to the SDU on BiPAP.  She was started on IV Solu-Medrol and remdesivir.

## 2022-01-06 NOTE — Assessment & Plan Note (Signed)
Continue statin. 

## 2022-01-06 NOTE — ED Triage Notes (Signed)
Pt BIB EMS from [facility], covid + in respiratory distess with O2 sat in 70's on 5LPM NRB. EMS placed pt on 15LPM NRB, O2 increased to 87%.

## 2022-01-07 ENCOUNTER — Telehealth: Payer: Self-pay | Admitting: *Deleted

## 2022-01-07 DIAGNOSIS — I471 Supraventricular tachycardia: Secondary | ICD-10-CM

## 2022-01-07 DIAGNOSIS — J9601 Acute respiratory failure with hypoxia: Secondary | ICD-10-CM | POA: Diagnosis not present

## 2022-01-07 DIAGNOSIS — J441 Chronic obstructive pulmonary disease with (acute) exacerbation: Secondary | ICD-10-CM | POA: Diagnosis not present

## 2022-01-07 DIAGNOSIS — A419 Sepsis, unspecified organism: Secondary | ICD-10-CM | POA: Diagnosis not present

## 2022-01-07 DIAGNOSIS — N1831 Chronic kidney disease, stage 3a: Secondary | ICD-10-CM | POA: Diagnosis not present

## 2022-01-07 LAB — LEGIONELLA PNEUMOPHILA SEROGP 1 UR AG: L. pneumophila Serogp 1 Ur Ag: NEGATIVE

## 2022-01-07 LAB — BLOOD CULTURE ID PANEL (REFLEXED) - BCID2

## 2022-01-07 LAB — COMPREHENSIVE METABOLIC PANEL
ALT: 19 U/L (ref 0–44)
AST: 19 U/L (ref 15–41)
Albumin: 2.9 g/dL — ABNORMAL LOW (ref 3.5–5.0)
Alkaline Phosphatase: 57 U/L (ref 38–126)
Anion gap: 6 (ref 5–15)
BUN: 21 mg/dL (ref 8–23)
CO2: 23 mmol/L (ref 22–32)
Calcium: 7.7 mg/dL — ABNORMAL LOW (ref 8.9–10.3)
Chloride: 111 mmol/L (ref 98–111)
Creatinine, Ser: 0.66 mg/dL (ref 0.44–1.00)
GFR, Estimated: >60 mL/min (ref >60)
Glucose, Bld: 110 mg/dL — ABNORMAL HIGH (ref 70–99)
Potassium: 3.7 mmol/L (ref 3.5–5.1)
Sodium: 140 mmol/L (ref 135–145)
Total Bilirubin: 0.4 mg/dL (ref 0.3–1.2)
Total Protein: 6.1 g/dL — ABNORMAL LOW (ref 6.5–8.1)

## 2022-01-07 LAB — CBC
HCT: 30.9 % — ABNORMAL LOW (ref 36.0–46.0)
Hemoglobin: 9.2 g/dL — ABNORMAL LOW (ref 12.0–15.0)
MCH: 26.1 pg (ref 26.0–34.0)
MCHC: 29.8 g/dL — ABNORMAL LOW (ref 30.0–36.0)
MCV: 87.8 fL (ref 80.0–100.0)
Platelets: 169 10*3/uL (ref 150–400)
RBC: 3.52 MIL/uL — ABNORMAL LOW (ref 3.87–5.11)
RDW: 15.4 % (ref 11.5–15.5)
WBC: 12.1 10*3/uL — ABNORMAL HIGH (ref 4.0–10.5)
nRBC: 0 % (ref 0.0–0.2)

## 2022-01-07 LAB — MAGNESIUM: Magnesium: 2 mg/dL (ref 1.7–2.4)

## 2022-01-07 LAB — C-REACTIVE PROTEIN: CRP: 21.4 mg/dL — ABNORMAL HIGH (ref <1.0)

## 2022-01-07 LAB — FERRITIN: Ferritin: 171 ng/mL (ref 11–307)

## 2022-01-07 LAB — LACTIC ACID, PLASMA: Lactic Acid, Venous: 0.9 mmol/L (ref 0.5–1.9)

## 2022-01-07 LAB — D-DIMER, QUANTITATIVE: D-Dimer, Quant: 0.92 ug/mL-FEU — ABNORMAL HIGH (ref 0.00–0.50)

## 2022-01-07 LAB — PROCALCITONIN: Procalcitonin: 3.04 ng/mL

## 2022-01-07 MED ORDER — IPRATROPIUM-ALBUTEROL 0.5-2.5 (3) MG/3ML IN SOLN
3.0000 mL | Freq: Two times a day (BID) | RESPIRATORY_TRACT | Status: DC
  Administered 2022-01-08: 3 mL via RESPIRATORY_TRACT
  Filled 2022-01-07: qty 3

## 2022-01-07 MED ORDER — VERAPAMIL HCL ER 120 MG PO TBCR
120.0000 mg | EXTENDED_RELEASE_TABLET | Freq: Every day | ORAL | Status: DC
  Filled 2022-01-07 (×2): qty 1

## 2022-01-07 MED ORDER — ALBUTEROL SULFATE (2.5 MG/3ML) 0.083% IN NEBU: 2.5000 mg | INHALATION_SOLUTION | RESPIRATORY_TRACT | Status: DC | PRN

## 2022-01-07 MED ORDER — VERAPAMIL HCL ER 120 MG PO CP24
120.0000 mg | ORAL_CAPSULE | Freq: Every day | ORAL | Status: DC
  Administered 2022-01-07 – 2022-01-08 (×2): 120 mg via ORAL
  Filled 2022-01-07 (×5): qty 1

## 2022-01-07 MED ORDER — MELATONIN 3 MG PO TABS
6.0000 mg | ORAL_TABLET | Freq: Once | ORAL | Status: AC
  Administered 2022-01-07: 6 mg via ORAL
  Filled 2022-01-07: qty 2

## 2022-01-07 MED ORDER — IPRATROPIUM-ALBUTEROL 0.5-2.5 (3) MG/3ML IN SOLN
3.0000 mL | Freq: Three times a day (TID) | RESPIRATORY_TRACT | Status: DC
  Administered 2022-01-07: 3 mL via RESPIRATORY_TRACT
  Filled 2022-01-07: qty 3

## 2022-01-07 MED ORDER — HALOPERIDOL LACTATE 5 MG/ML IJ SOLN
2.0000 mg | Freq: Once | INTRAMUSCULAR | Status: AC
  Administered 2022-01-07: 2 mg via INTRAMUSCULAR
  Filled 2022-01-07: qty 1

## 2022-01-07 NOTE — Telephone Encounter (Signed)
Transition Care Management Unsuccessful Follow-up Telephone Call  Date of discharge and from where:  01/06/2022 [facility]  Attempts:  1st Attempt  Reason for unsuccessful TCM follow-up call:  Unable to reach patient [facility]

## 2022-01-07 NOTE — Assessment & Plan Note (Addendum)
Had episode on 01/07/22 morning lasting < 1 min -restart verapamil at lower dose>>titrate back to home dose No further SVT at time of d/c

## 2022-01-07 NOTE — Progress Notes (Signed)
PROGRESS NOTE  [name] GUR:[id number] DOB: 06-Jul-1941 DOA: 01/06/2022 PCP: Gerlene Fee, NP  Brief History:  81 year old female with a history of dementia, COPD, chronic respiratory failure on 2 L, hypertension, hyperlipidemia, CKD stage III, OSA on CPAP presenting from [facility] with respiratory distress.  History is unobtainable secondary to the patient's extremis and dementia.  Apparently, the patient was noted to have oxygen saturation of 60% range on 5 L nasal cannula.  She was placed on a nonrebreather initially and subsequently placed on BiPAP in the emergency department.  The patient is awake and alert on BiPAP, but not answering questions or following commands.  Review of the medical record shows that that the patient had molnupiravir on her MAR on 01/04/2022.  Nevertheless, her COVID-19 PCR was positive in the emergency department.  The patient was febrile with a fever of 101.1 and tachycardic in 110s with soft blood pressures in the 100s.  BMP showed sodium 136, potassium 4.0, serum creatinine 1.24.  CBC showed WBC 10.6, hemoglobin 12.0, platelets 229.  Chest x-ray showed bibasilar infiltrates right greater than left.  The patient was given 2.5L fluid and started on ceftriaxone and azithromycin.  She was admitted to the SDU on BiPAP.  She was started on IV Solu-Medrol and remdesivir.     Assessment and Plan: * Severe sepsis with septic shock ([place]) Secondary to pneumonia Presented with tachycardia, fever, respiratory failure and hypotension Lactic acid 3.1 PCT 1.38 Continue ceftriaxone and azithromycin Urine Legionella antigen Urine Streptococcus pneumoniae antigen--neg Follow blood cultures--contaminant Restart IV fluids Levophed at 3 mcg/kg/min>> weaned off 01/06/22  Acute on chronic respiratory failure with hypoxia and hypercapnia (HCC) Requires 2L [place] at baseline Arrived satting at 87% on 15L NRB Sats improved on 50% FiO2 BiPAP Weaned to 4L  [place] currently 2/2 COPD exacerbation and pneumonia Wean oxygen back to baseline  Pneumonia due to COVID-19 virus- (present on admission) Diagnosed with covid 19 in the outpatient setting Had been on molnupiravir  Unable to take p.o.initially due to AMS and BiPAP PCT 1.38<<3.04 CRP 1.6>>21.4 D-dimer 0.54>>0.92 Ferritin 155>>171 Continue remdesivir D#2 Continue Solu-Medrol Follow blood cultures   Lobar pneumonia (HCC) PCT 1.38>>3.04 Continue ceftriaxone and azithromycin Urine Legionella antigen Urine Streptococcus pneumoniae antigen--neg  COPD with acute exacerbation ([place])- (present on admission) Triggered by covid and pneumonia 2L  at baseline Wean off BiPAP as tolerated Continue IV Solu-Medrol Continue scheduled duoNeb and PRN albuterol Continue Brovana Start flutter valve and incentive spirometry when off of BiPAP  CKD (chronic kidney disease) stage 3, GFR 30-59 ml/min (HCC)- (present on admission) Baseline creatinine 0.7-1.0 Daily BMP  SVT (supraventricular tachycardia) (HCC) Had episode on 01/07/22 morning lasting < 1 min -restart verapamil at lower dose  Major neurocognitive disorder (HCC) Continue aricept  GERD- (present on admission) Continue PPI  Essential hypertension- (present on admission) Holding amlodipine and verapamil in the setting of hypotension initially Restart lower dose verapamil Will no restart amlodipine  Hyperlipidemia LDL goal <100- (present on admission) Continue statin       Status is: Inpatient Remains inpatient appropriate because: Severity of illness requiring BiPAP and vasopressors.  Hemodynamic instability as discussed above                       Family Communication:  no Family at bedside   Consultants:  noe   Code Status:  DNR   DVT Prophylaxis:  [place] Heparin  Procedures: As Listed in Progress Note Above   Antibiotics: Ceftriaxone 2/21>> Azithromycin 2/21>>            Subjective: Patient  is pleasantly confused.  Denies cp, sob, abd pain, n/v.  Remainder ROS not obtainable due to dementia  Objective: Vitals:   01/07/22 1000 01/07/22 1100 01/07/22 1108 01/07/22 1131  BP: (!) 159/85 (!) 114/43    Pulse: 76 73 (!) 155   Resp: 14 11 16    Temp:    (!) 97 F (36.1 C)  TempSrc:    Axillary  SpO2: 98% 100% 95%   Weight:      Height:        Intake/Output Summary (Last 24 hours) at 01/07/2022 1607 Last data filed at 01/07/2022 0858 Gross per 24 hour  Intake 2587.87 ml  Output 1425 ml  Net 1162.87 ml   Weight change: 1.5 kg Exam:  General:  Pt is alert, intermittently follows commands appropriately, not in acute distress HEENT: No icterus, No thrush, No neck mass, [place]/AT Cardiovascular: RRR, S1/S2, no rubs, no gallops Respiratory: bibasilar crackles.  No wheeze Abdomen: Soft/+BS, non tender, non distended, no guarding Extremities: No edema, No lymphangitis, No petechiae, No rashes, no synovitis   Data Reviewed: I have personally reviewed following labs and imaging studies Basic Metabolic Panel: Recent Labs  Lab 01/04/22 1752 01/06/22 0211 01/06/22 0426 01/07/22 0421  NA 136 136 138 140  K 3.8 4.0 3.5 3.7  CL 103 101 105 111  CO2 27 25 20* 23  GLUCOSE 78 160* 148* 110*  BUN 21 26* 25* 21  CREATININE 0.88 1.24* 1.08* 0.66  CALCIUM 9.2 9.0 8.2* 7.7*  MG  --   --  2.3  --    Liver Function Tests: Recent Labs  Lab 01/06/22 0211 01/06/22 0426 01/07/22 0421  AST 28 22 19   ALT 20 16 19   ALKPHOS 82 64 57  BILITOT 1.0 0.5 0.4  PROT 7.4 6.6 6.1*  ALBUMIN 4.0 3.2* 2.9*   No results for input(s): LIPASE, AMYLASE in the last 168 hours. No results for input(s): AMMONIA in the last 168 hours. Coagulation Profile: Recent Labs  Lab 01/06/22 0211 01/06/22 0609  INR 1.0 1.1   CBC: Recent Labs  Lab 01/04/22 1752 01/06/22 0211 01/06/22 0426 01/07/22 0421  WBC 5.8 10.6* 9.4 12.1*  NEUTROABS  --   --  8.8*  --   HGB 10.0* 12.0 10.2* 9.2*  HCT 32.9* 40.6  34.6* 30.9*  MCV 89.2 89.8 90.3 87.8  PLT 188 229 174 169   Cardiac Enzymes: No results for input(s): CKTOTAL, CKMB, CKMBINDEX, TROPONINI in the last 168 hours. BNP: Invalid input(s): POCBNP CBG: No results for input(s): GLUCAP in the last 168 hours. HbA1C: No results for input(s): HGBA1C in the last 72 hours. Urine analysis:    Component Value Date/Time   COLORURINE YELLOW 06/02/2021 1930   APPEARANCEUR CLEAR 06/02/2021 1930   LABSPEC 1.014 06/02/2021 1930   PHURINE 5.0 06/02/2021 1930   GLUCOSEU NEGATIVE 06/02/2021 1930   HGBUR NEGATIVE 06/02/2021 1930   HGBUR negative 10/30/2010 0822   BILIRUBINUR NEGATIVE 06/02/2021 1930   BILIRUBINUR negative 09/30/2020 1219   BILIRUBINUR small 05/20/2014 1349   KETONESUR NEGATIVE 06/02/2021 1930   PROTEINUR NEGATIVE 06/02/2021 1930   UROBILINOGEN 0.2 09/30/2020 1219   UROBILINOGEN 0.2 06/22/2014 2250   NITRITE NEGATIVE 06/02/2021 1930   LEUKOCYTESUR NEGATIVE 06/02/2021 1930   Sepsis Labs: @LABRCNTIP (procalcitonin:4,lacticidven:4) ) Recent Results (from the past 240 hour(s))  Resp Panel  by RT-PCR (Flu A&B, Covid) Nasopharyngeal Swab     Status: Abnormal   Collection Time: 01/06/22  2:01 AM   Specimen: Nasopharyngeal Swab; Nasopharyngeal(NP) swabs in vial transport medium  Result Value Ref Range Status   SARS Coronavirus 2 by RT PCR POSITIVE (A) NEGATIVE Final    Comment: (NOTE) SARS-CoV-2 target nucleic acids are DETECTED.  The SARS-CoV-2 RNA is generally detectable in upper respiratory specimens during the acute phase of infection. Positive results are indicative of the presence of the identified virus, but do not rule out bacterial infection or co-infection with other pathogens not detected by the test. Clinical correlation with patient history and other diagnostic information is necessary to determine patient infection status. The expected result is Negative.  Fact Sheet for  Patients: EntrepreneurPulse.com.au  Fact Sheet for Healthcare Providers: IncredibleEmployment.be  This test is not yet approved or cleared by the [place] FDA and  has been authorized for detection and/or diagnosis of SARS-CoV-2 by FDA under an Emergency Use Authorization (EUA).  This EUA will remain in effect (meaning this test can be used) for the duration of  the COVID-19 declaration under Section 564(b)(1) of the A ct, 21 U.S.C. section 360bbb-3(b)(1), unless the authorization is terminated or revoked sooner.     Influenza A by PCR NEGATIVE NEGATIVE Final   Influenza B by PCR NEGATIVE NEGATIVE Final    Comment: (NOTE) The Xpert Xpress SARS-CoV-2/FLU/RSV plus assay is intended as an aid in the diagnosis of influenza from Nasopharyngeal swab specimens and should not be used as a sole basis for treatment. Nasal washings and aspirates are unacceptable for Xpert Xpress SARS-CoV-2/FLU/RSV testing.  Fact Sheet for Patients: EntrepreneurPulse.com.au  Fact Sheet for Healthcare Providers: IncredibleEmployment.be  This test is not yet approved or cleared by the [place] FDA and has been authorized for detection and/or diagnosis of SARS-CoV-2 by FDA under an Emergency Use Authorization (EUA). This EUA will remain in effect (meaning this test can be used) for the duration of the COVID-19 declaration under Section 564(b)(1) of the Act, 21 U.S.C. section 360bbb-3(b)(1), unless the authorization is terminated or revoked.  Performed at [facility], [address], [place], [place] [address]   Blood culture (routine x 2)     Status: None (Preliminary result)   Collection Time: 01/06/22  2:11 AM   Specimen: BLOOD  Result Value Ref Range Status   Specimen Description BLOOD BLOOD RIGHT FOREARM  Final   Special Requests   Final    BOTTLES DRAWN AEROBIC AND ANAEROBIC Blood Culture adequate volume   Culture    Final    NO GROWTH 1 DAY Performed at [facility], [address] [place], [place] [address]    Report Status PENDING  Incomplete  Blood culture (routine x 2)     Status: None (Preliminary result)   Collection Time: 01/06/22  2:11 AM   Specimen: BLOOD  Result Value Ref Range Status   Specimen Description   Final    BLOOD BLOOD LEFT FOREARM Performed at [facility], [address], [place], [place] [address]    Special Requests   Final    BOTTLES DRAWN AEROBIC AND ANAEROBIC Blood Culture adequate volume Performed at [facility], [address] [place], [place] [address]    Culture  Setup Time   Final    GRAM POSITIVE COCCI AEROBIC BOTTLE Gram Stain Report Called to,Read Back By and Verified With: EVERETT,D@0041  BY MATTHEWS, B 2.23.23 CRITICAL RESULT CALLED TO, READ BACK BY AND VERIFIED WITH:  RN JESSICA ELLER 01/07/22@3 :47 BY TW Performed at [facility], [place] [address] [place], [place] [address]    Culture GRAM POSITIVE COCCI  Final   Report Status PENDING  Incomplete  Blood Culture ID Panel (Reflexed)     Status: Abnormal   Collection Time: 01/06/22  2:11 AM  Result Value Ref Range Status   Enterococcus faecalis NOT DETECTED NOT DETECTED Final   Enterococcus Faecium NOT DETECTED NOT DETECTED Final   Listeria monocytogenes NOT DETECTED NOT DETECTED Final   Staphylococcus species DETECTED (A) NOT DETECTED Final    Comment: CRITICAL RESULT CALLED TO, READ BACK BY AND VERIFIED WITH: RN JESSICA ELLER 01/07/22@3 :47 BY TW    Staphylococcus aureus (BCID) NOT DETECTED NOT DETECTED Final   Staphylococcus epidermidis NOT DETECTED NOT DETECTED Final   Staphylococcus lugdunensis NOT DETECTED NOT DETECTED Final   Streptococcus species NOT DETECTED NOT DETECTED Final   Streptococcus agalactiae NOT DETECTED NOT DETECTED Final   Streptococcus pneumoniae NOT DETECTED NOT DETECTED Final   Streptococcus pyogenes NOT DETECTED NOT DETECTED Final   A.calcoaceticus-baumannii NOT DETECTED NOT  DETECTED Final   Bacteroides fragilis NOT DETECTED NOT DETECTED Final   Enterobacterales NOT DETECTED NOT DETECTED Final   Enterobacter cloacae complex NOT DETECTED NOT DETECTED Final   Escherichia coli NOT DETECTED NOT DETECTED Final   Klebsiella aerogenes NOT DETECTED NOT DETECTED Final   Klebsiella oxytoca NOT DETECTED NOT DETECTED Final   Klebsiella pneumoniae NOT DETECTED NOT DETECTED Final   Proteus species NOT DETECTED NOT DETECTED Final   Salmonella species NOT DETECTED NOT DETECTED Final   Serratia marcescens NOT DETECTED NOT DETECTED Final   Haemophilus influenzae NOT DETECTED NOT DETECTED Final   Neisseria meningitidis NOT DETECTED NOT DETECTED Final   Pseudomonas aeruginosa NOT DETECTED NOT DETECTED Final   Stenotrophomonas maltophilia NOT DETECTED NOT DETECTED Final   Candida albicans NOT DETECTED NOT DETECTED Final   Candida auris NOT DETECTED NOT DETECTED Final   Candida glabrata NOT DETECTED NOT DETECTED Final   Candida krusei NOT DETECTED NOT DETECTED Final   Candida parapsilosis NOT DETECTED NOT DETECTED Final   Candida tropicalis NOT DETECTED NOT DETECTED Final   Cryptococcus neoformans/gattii NOT DETECTED NOT DETECTED Final    Comment: Performed at [facility], [place] [address] [place], [place] [address]  MRSA Next Gen by PCR, Nasal     Status: None   Collection Time: 01/06/22  4:59 AM   Specimen: Nasal Mucosa; Nasal Swab  Result Value Ref Range Status   MRSA by PCR Next Gen NOT DETECTED NOT DETECTED Final    Comment: (NOTE) The GeneXpert MRSA Assay (FDA approved for NASAL specimens only), is one component of a comprehensive MRSA colonization surveillance program. It is not intended to diagnose MRSA infection nor to guide or monitor treatment for MRSA infections. Test performance is not FDA approved in patients less than 35 years old. Performed at [facility], [address], [place], [place] [address]      Scheduled Meds:  arformoterol  15 mcg  Nebulization BID   vitamin C  500 mg Oral BID   atorvastatin  10 mg Oral Daily   Chlorhexidine Gluconate Cloth  6 each Topical Daily   donepezil  10 mg Oral QHS   feeding supplement  237 mL Oral BID BM   heparin  5,000 Units Subcutaneous Q8H   ipratropium-albuterol  3 mL Nebulization Q8H   methylPREDNISolone (SOLU-MEDROL) injection  125 mg Intravenous Daily   mirabegron ER  50 mg Oral QPM   pantoprazole (PROTONIX) IV  40 mg Intravenous Q24H   verapamil  120 mg Oral Daily   zinc sulfate  220 mg Oral Daily   Continuous Infusions:  sodium chloride     azithromycin Stopped (01/07/22 0223)   cefTRIAXone (ROCEPHIN)  IV Stopped (01/07/22 0158)   norepinephrine (LEVOPHED) Adult infusion Stopped (01/06/22 0901)   remdesivir 100 mg in NS 100 mL Stopped (01/07/22 0850)    Procedures/Studies: DG Chest Port 1 View  Result Date: 01/06/2022 CLINICAL DATA:  Respiratory distress. EXAM: PORTABLE CHEST 1 VIEW COMPARISON:  January 28, 2017 FINDINGS: The lungs are hyperinflated. Mild infiltrates are seen within the bilateral lung bases, right greater than left. There is no evidence of a pleural effusion or pneumothorax. The heart size and mediastinal contours are within normal limits. Multilevel degenerative changes are noted throughout the thoracic spine. IMPRESSION: Mild bibasilar infiltrates, right greater than left. Electronically Signed   By: Virgina Norfolk M.D.   On: 01/06/2022 02:23    Orson Eva, DO  [facility]  If 7PM-7AM, please contact night-coverage [email or web address] Password [email or web address] 01/07/2022, 4:07 PM   LOS: 1 day

## 2022-01-08 ENCOUNTER — Encounter (HOSPITAL_COMMUNITY): Payer: Self-pay | Admitting: Family Medicine

## 2022-01-08 DIAGNOSIS — J181 Lobar pneumonia, unspecified organism: Secondary | ICD-10-CM | POA: Diagnosis not present

## 2022-01-08 DIAGNOSIS — J9601 Acute respiratory failure with hypoxia: Secondary | ICD-10-CM | POA: Diagnosis not present

## 2022-01-08 DIAGNOSIS — A419 Sepsis, unspecified organism: Secondary | ICD-10-CM | POA: Diagnosis not present

## 2022-01-08 DIAGNOSIS — J441 Chronic obstructive pulmonary disease with (acute) exacerbation: Secondary | ICD-10-CM | POA: Diagnosis not present

## 2022-01-08 LAB — CULTURE, BLOOD (ROUTINE X 2): Special Requests: ADEQUATE

## 2022-01-08 LAB — COMPREHENSIVE METABOLIC PANEL
ALT: 20 U/L (ref 0–44)
AST: 19 U/L (ref 15–41)
Albumin: 3.1 g/dL — ABNORMAL LOW (ref 3.5–5.0)
Alkaline Phosphatase: 65 U/L (ref 38–126)
Anion gap: 7 (ref 5–15)
BUN: 17 mg/dL (ref 8–23)
CO2: 26 mmol/L (ref 22–32)
Calcium: 8.2 mg/dL — ABNORMAL LOW (ref 8.9–10.3)
Chloride: 106 mmol/L (ref 98–111)
Creatinine, Ser: 0.62 mg/dL (ref 0.44–1.00)
GFR, Estimated: >60 mL/min (ref >60)
Glucose, Bld: 118 mg/dL — ABNORMAL HIGH (ref 70–99)
Potassium: 3.5 mmol/L (ref 3.5–5.1)
Sodium: 139 mmol/L (ref 135–145)
Total Bilirubin: 0.4 mg/dL (ref 0.3–1.2)
Total Protein: 6.6 g/dL (ref 6.5–8.1)

## 2022-01-08 LAB — CBC
HCT: 31.9 % — ABNORMAL LOW (ref 36.0–46.0)
Hemoglobin: 9.5 g/dL — ABNORMAL LOW (ref 12.0–15.0)
MCH: 25.7 pg — ABNORMAL LOW (ref 26.0–34.0)
MCHC: 29.8 g/dL — ABNORMAL LOW (ref 30.0–36.0)
MCV: 86.4 fL (ref 80.0–100.0)
Platelets: 219 10*3/uL (ref 150–400)
RBC: 3.69 MIL/uL — ABNORMAL LOW (ref 3.87–5.11)
RDW: 15.3 % (ref 11.5–15.5)
WBC: 14.6 10*3/uL — ABNORMAL HIGH (ref 4.0–10.5)
nRBC: 0 % (ref 0.0–0.2)

## 2022-01-08 LAB — MAGNESIUM: Magnesium: 2 mg/dL (ref 1.7–2.4)

## 2022-01-08 LAB — PROCALCITONIN: Procalcitonin: 1.9 ng/mL

## 2022-01-08 LAB — C-REACTIVE PROTEIN: CRP: 12.2 mg/dL — ABNORMAL HIGH (ref <1.0)

## 2022-01-08 LAB — FERRITIN: Ferritin: 193 ng/mL (ref 11–307)

## 2022-01-08 LAB — D-DIMER, QUANTITATIVE: D-Dimer, Quant: 0.77 ug/mL-FEU — ABNORMAL HIGH (ref 0.00–0.50)

## 2022-01-08 MED ORDER — HALOPERIDOL LACTATE 5 MG/ML IJ SOLN
2.0000 mg | Freq: Once | INTRAMUSCULAR | Status: AC
  Administered 2022-01-08: 2 mg via INTRAMUSCULAR
  Filled 2022-01-08: qty 1

## 2022-01-08 MED ORDER — PREDNISONE 20 MG PO TABS: 60.0000 mg | ORAL_TABLET | Freq: Every day | ORAL | Status: DC

## 2022-01-08 MED ORDER — AZITHROMYCIN 500 MG PO TABS: 500.0000 mg | ORAL_TABLET | Freq: Every day | ORAL | 0 refills | Status: DC

## 2022-01-08 MED ORDER — AZITHROMYCIN 250 MG PO TABS: 500.0000 mg | ORAL_TABLET | Freq: Every day | ORAL | Status: DC

## 2022-01-08 MED ORDER — CEFDINIR 300 MG PO CAPS: 300.0000 mg | ORAL_CAPSULE | Freq: Two times a day (BID) | ORAL | Status: DC

## 2022-01-08 NOTE — Progress Notes (Signed)
Patient transferred to [facility] room# [place] (nurse) at bedside & patient's paperwork, belongings, & meal tray given to her

## 2022-01-08 NOTE — TOC Progression Note (Signed)
Transition of Care [facility]) - Progression Note    Patient Details  Name: [name] MRN: [id number] Date of Birth: Mar 31, 1941  Transition of Care [facility] CM/SW Contact  Shade Flood, LCSW Phone Number: 01/08/2022, 4:31 PM  Clinical Narrative:     Tammy at [facility] now states pt can transfer back to them while they await auth for rehab. Updated RN.  Expected Discharge Plan: [place] Barriers to Discharge: No Barriers Identified  Expected Discharge Plan and Services Expected Discharge Plan: [place] arrangements for the past 2 months: [place] Expected Discharge Date: 01/08/22                                     Social Determinants of Health (SDOH) Interventions    Readmission Risk Interventions No flowsheet data found.

## 2022-01-08 NOTE — Plan of Care (Signed)
Patient discharging back to SNF

## 2022-01-08 NOTE — Plan of Care (Signed)
°  Problem: Acute Rehab PT Goals(only PT should resolve) Goal: Pt Will Go Supine/Side To Sit Outcome: Progressing Flowsheets (Taken 01/08/2022 1139) Pt will go Supine/Side to Sit:  with minimal assist  with min guard assist Goal: Patient Will Transfer Sit To/From Stand Outcome: Progressing Flowsheets (Taken 01/08/2022 1139) Patient will transfer sit to/from stand: with minimal assist Goal: Pt Will Transfer Bed To Chair/Chair To Bed Outcome: Progressing Flowsheets (Taken 01/08/2022 1139) Pt will Transfer Bed to Chair/Chair to Bed: with min assist Goal: Pt Will Ambulate Outcome: Progressing Flowsheets (Taken 01/08/2022 1139) Pt will Ambulate:  10 feet  with moderate assist  with rolling walker   11:39 AM, 01/08/22 Lonell Grandchild, MPT Physical Therapist with [facility] [phone] office [phone] mobile phone

## 2022-01-08 NOTE — TOC Progression Note (Signed)
CSW spoke to Tammy with [facility] who states that pt needs auth started since PT has recommended SNF for pt. TOC started insurance auth. TOC to follow.

## 2022-01-08 NOTE — Progress Notes (Signed)
Pt woke up agitated pulling off x2 IVs, ekg leads, bp cuff, and gown. Pt unable to understand she is in the hospital. Pt attempting to kick and swing at nursing staff. Attempted to reorient pt but she is refusing to put medical equipment and gown back on. Provider notified. IM Haldol ordered. Given at 0434AM with another RN assistance.

## 2022-01-08 NOTE — Progress Notes (Signed)
[facility] & report on patient given to receiving nurse (Delphina), reported would call Delphina when otw to bring patient over

## 2022-01-08 NOTE — Discharge Summary (Signed)
Physician Discharge Summary   Patient: [name] MRN: [id number] DOB: [birth year]  Admit date:     01/06/2022  Discharge date: 01/08/22  Discharge Physician: Shanon Brow Babbie Dondlinger   PCP: Gerlene Fee, NP   Recommendations at discharge:   Please follow up with primary care provider within 1-2 weeks  Please repeat BMP and CBC in one week     Hospital Course: 81 year old female with a history of dementia, COPD, chronic respiratory failure on 2 L, hypertension, hyperlipidemia, CKD stage III, OSA on CPAP presenting from [facility] with respiratory distress.  History is unobtainable secondary to the patient's extremis and dementia.  Apparently, the patient was noted to have oxygen saturation of 60% range on 5 L nasal cannula.  She was placed on a nonrebreather initially and subsequently placed on BiPAP in the emergency department.  The patient is awake and alert on BiPAP, but not answering questions or following commands.  Review of the medical record shows that that the patient had molnupiravir on her MAR on 01/04/2022.  Nevertheless, her COVID-19 PCR was positive in the emergency department.  The patient was febrile with a fever of 101.1 and tachycardic in 110s with soft blood pressures in the 100s.  BMP showed sodium 136, potassium 4.0, serum creatinine 1.24.  CBC showed WBC 10.6, hemoglobin 12.0, platelets 229.  Chest x-ray showed bibasilar infiltrates right greater than left.  The patient was given 2.5L fluid and started on ceftriaxone and azithromycin.  She was admitted to the SDU on BiPAP.  She was started on IV Solu-Medrol and remdesivir.  Assessment and Plan: * Severe sepsis with septic shock ([place]) Secondary to pneumonia Presented with tachycardia, fever, respiratory failure and hypotension Lactic acid 3.1 PCT 1.38 Continue ceftriaxone and azithromycin Urine Legionella antigen Urine Streptococcus pneumoniae antigen--neg Follow blood cultures--contaminant Restart IV  fluids Levophed at 3 mcg/kg/min>> weaned off 01/06/22 Sepsis physiology resolved  Acute on chronic respiratory failure with hypoxia and hypercapnia (HCC) Requires 2L [place] at baseline Arrived satting at 87% on 15L [facility] Sats improved on 50% FiO2 BiPAP Weaned to 4L [place] currently 2/2 COPD exacerbation and pneumonia Wean oxygen back to baseline  Pneumonia due to COVID-19 virus- (present on admission) Diagnosed with covid 19 in the outpatient setting Had been on molnupiravir  Unable to take p.o.initially due to AMS and BiPAP PCT 1.38<<3.04 CRP 1.6>>21.4 D-dimer 0.54>>0.92 Ferritin 155>>171 Continue remdesivir D#3;  Received 3 days during hospitalization Continue Solu-Medrol>>d/c with 5 more days prednisone Follow blood cultures   Lobar pneumonia (HCC) PCT 1.38>>3.04 Continue ceftriaxone and azithromycin Urine Legionella antigen--neg Urine Streptococcus pneumoniae antigen--neg D/c with 4 more days cefdinir and azithro  COPD with acute exacerbation ([place])- (present on admission) Triggered by covid and pneumonia 2L  at baseline Wean off BiPAP as tolerated Continue IV Solu-Medrol Continue scheduled duoNeb and PRN albuterol Continue Brovana Start flutter valve and incentive spirometry when off of BiPAP  CKD (chronic kidney disease) stage 3, GFR 30-59 ml/min (HCC)- (present on admission) Baseline creatinine 0.7-1.0 Daily BMP  SVT (supraventricular tachycardia) (HCC) Had episode on 01/07/22 morning lasting < 1 min -restart verapamil at lower dose>>titrate back to home dose No further SVT at time of d/c  Major neurocognitive disorder (HCC) Continue aricept  GERD- (present on admission) Continue PPI  Essential hypertension- (present on admission) Holding amlodipine and verapamil in the setting of hypotension initially Restart lower dose verapamil Will no restart amlodipine  Hyperlipidemia LDL goal <100- (present on admission) Continue statin  Consultants:  none Procedures performed: none  Disposition: Skilled nursing facility Diet recommendation:  Cardiac diet  DISCHARGE MEDICATION: Allergies as of 01/08/2022       Reactions   Fenofibrate Nausea And Vomiting   Pravastatin Sodium Nausea And Vomiting   Sulfonamide Derivatives Other (See Comments)   unknown   Ciprofloxacin Rash        Medication List     STOP taking these medications    amLODipine 2.5 MG tablet Commonly known as: NORVASC   Lagevrio 200 MG Caps capsule Generic drug: molnupiravir EUA       TAKE these medications    acetaminophen 325 MG tablet Commonly known as: TYLENOL Take 650 mg by mouth every 8 (eight) hours as needed.   albuterol 108 (90 Base) MCG/ACT inhaler Commonly known as: VENTOLIN HFA Inhale 2 puffs into the lungs every 6 (six) hours as needed for wheezing or shortness of breath.   alendronate 70 MG tablet Commonly known as: FOSAMAX Take 70 mg by mouth once a week. Take on an empty stomach at least 30 mns prior to food. Sit upright x 46mns after taking meds.   atorvastatin 10 MG tablet Commonly known as: LIPITOR TAKE 1 TABLET EVERY DAY   azithromycin 500 MG tablet Commonly known as: ZITHROMAX Take 1 tablet (500 mg total) by mouth daily. X 4 days   CALCIUM CARBONATE-VITAMIN D PO Take by mouth. 600 mg-10 mcg, oral, Once A Day   cefdinir 300 MG capsule Commonly known as: OMNICEF Take 1 capsule (300 mg total) by mouth every 12 (twelve) hours. X 4 days   donepezil 10 MG tablet Commonly known as: ARICEPT TAKE 1 TABLET AT BEDTIME   guaiFENesin 600 MG 12 hr tablet Commonly known as: MUCINEX Take by mouth 2 (two) times daily.   loperamide 2 MG tablet Commonly known as: IMODIUM A-D Take 2 mg by mouth as needed for diarrhea or loose stools.   loratadine 10 MG tablet Commonly known as: CLARITIN Take 10 mg by mouth daily.   mirabegron ER 50 MG Tb24 tablet Commonly known as: MYRBETRIQ Take 50 mg by mouth daily.   NON  FORMULARY Diet-regular   omeprazole 40 MG capsule Commonly known as: PRILOSEC TAKE 1 CAPSULE EVERY DAY   OXYGEN Inhale 2 L into the lungs continuous.   potassium chloride 20 MEQ packet Commonly known as: KLOR-CON Take 20 mEq by mouth daily.   predniSONE 20 MG tablet Commonly known as: DELTASONE Take 3 tablets (60 mg total) by mouth daily with breakfast. X 5 days Start taking on: January 09, 2022   [place]riverdi Respimat 2.5 MCG/ACT Aers Generic drug: Olodaterol HCl INHALE 2 PUFFS ONE TIME DAILY AT THE SAME TIME   verapamil 240 MG CR tablet Commonly known as: CALAN-SR TAKE 1 TABLET AT BEDTIME   vitamin C 500 MG tablet Commonly known as: ASCORBIC ACID Take 500 mg by mouth 2 (two) times daily.   VITAMIN D3 PO Take 1,000 Units by mouth daily.   Zinc Lozg 50 mg zinc (220 mg); oral Once A Day        Contact information for after-discharge care     [facility] .   Service: Skilled Nursing Contact information: [address] [place] [phone]                     Discharge Exam: Filed Weights   01/06/22 0154 01/06/22 0617 01/07/22 0430  Weight: 75.8 kg 73.5 kg 77.3 kg   HEENT:  [place]/AT, No thrush, no icterus CV:  RRR, no rub, no S3, no S4 Lung:  bibasilar rales. No wheeze Abd:  soft/+BS, NT Ext:  No edema, no lymphangitis, no synovitis, no rash   Condition at discharge: stable  The results of significant diagnostics from this hospitalization (including imaging, microbiology, ancillary and laboratory) are listed below for reference.   Imaging Studies: DG Chest Port 1 View  Result Date: 01/06/2022 CLINICAL DATA:  Respiratory distress. EXAM: PORTABLE CHEST 1 VIEW COMPARISON:  January 28, 2017 FINDINGS: The lungs are hyperinflated. Mild infiltrates are seen within the bilateral lung bases, right greater than left. There is no evidence of a pleural effusion or pneumothorax. The  heart size and mediastinal contours are within normal limits. Multilevel degenerative changes are noted throughout the thoracic spine. IMPRESSION: Mild bibasilar infiltrates, right greater than left. Electronically Signed   By: Virgina Norfolk M.D.   On: 01/06/2022 02:23    Microbiology: Results for orders placed or performed during the hospital encounter of 01/06/22  Resp Panel by RT-PCR (Flu A&B, Covid) Nasopharyngeal Swab     Status: Abnormal   Collection Time: 01/06/22  2:01 AM   Specimen: Nasopharyngeal Swab; Nasopharyngeal(NP) swabs in vial transport medium  Result Value Ref Range Status   SARS Coronavirus 2 by RT PCR POSITIVE (A) NEGATIVE Final    Comment: (NOTE) SARS-CoV-2 target nucleic acids are DETECTED.  The SARS-CoV-2 RNA is generally detectable in upper respiratory specimens during the acute phase of infection. Positive results are indicative of the presence of the identified virus, but do not rule out bacterial infection or co-infection with other pathogens not detected by the test. Clinical correlation with patient history and other diagnostic information is necessary to determine patient infection status. The expected result is Negative.  Fact Sheet for Patients: EntrepreneurPulse.com.au  Fact Sheet for Healthcare Providers: IncredibleEmployment.be  This test is not yet approved or cleared by the [place] FDA and  has been authorized for detection and/or diagnosis of SARS-CoV-2 by FDA under an Emergency Use Authorization (EUA).  This EUA will remain in effect (meaning this test can be used) for the duration of  the COVID-19 declaration under Section 564(b)(1) of the A ct, 21 U.S.C. section 360bbb-3(b)(1), unless the authorization is terminated or revoked sooner.     Influenza A by PCR NEGATIVE NEGATIVE Final   Influenza B by PCR NEGATIVE NEGATIVE Final    Comment: (NOTE) The Xpert Xpress SARS-CoV-2/FLU/RSV plus assay is  intended as an aid in the diagnosis of influenza from Nasopharyngeal swab specimens and should not be used as a sole basis for treatment. Nasal washings and aspirates are unacceptable for Xpert Xpress SARS-CoV-2/FLU/RSV testing.  Fact Sheet for Patients: EntrepreneurPulse.com.au  Fact Sheet for Healthcare Providers: IncredibleEmployment.be  This test is not yet approved or cleared by the [place] FDA and has been authorized for detection and/or diagnosis of SARS-CoV-2 by FDA under an Emergency Use Authorization (EUA). This EUA will remain in effect (meaning this test can be used) for the duration of the COVID-19 declaration under Section 564(b)(1) of the Act, 21 U.S.C. section 360bbb-3(b)(1), unless the authorization is terminated or revoked.  Performed at [facility], [address] [place], [place] [address]   Blood culture (routine x 2)     Status: None (Preliminary result)   Collection Time: 01/06/22  2:11 AM   Specimen: BLOOD  Result Value Ref Range Status   Specimen Description BLOOD  BLOOD RIGHT FOREARM  Final   Special Requests   Final    BOTTLES DRAWN AEROBIC AND ANAEROBIC Blood Culture adequate volume   Culture   Final    NO GROWTH 2 DAYS Performed at [facility], [address], [place], [place] [address]    Report Status PENDING  Incomplete  Blood culture (routine x 2)     Status: Abnormal   Collection Time: 01/06/22  2:11 AM   Specimen: BLOOD  Result Value Ref Range Status   Specimen Description   Final    BLOOD BLOOD LEFT FOREARM Performed at [facility], [address], [place], [place] [address]    Special Requests   Final    BOTTLES DRAWN AEROBIC AND ANAEROBIC Blood Culture adequate volume Performed at [facility], [address] [place], [place] [address]    Culture  Setup Time   Final    GRAM POSITIVE COCCI AEROBIC BOTTLE Gram Stain Report Called to,Read Back By and Verified With: EVERETT,D@0041  BY MATTHEWS, B  2.23.23 CRITICAL RESULT CALLED TO, READ BACK BY AND VERIFIED WITH: RN JESSICA ELLER 01/07/22@3 :60 BY TW    Culture (A)  Final    STAPHYLOCOCCUS HOMINIS THE SIGNIFICANCE OF ISOLATING THIS ORGANISM FROM A SINGLE SET OF BLOOD CULTURES WHEN MULTIPLE SETS ARE DRAWN IS UNCERTAIN. PLEASE NOTIFY THE MICROBIOLOGY DEPARTMENT WITHIN ONE WEEK IF SPECIATION AND SENSITIVITIES ARE REQUIRED. Performed at [facility], [place] [address], [place], [place] [address]    Report Status 01/08/2022 FINAL  Final  Blood Culture ID Panel (Reflexed)     Status: Abnormal   Collection Time: 01/06/22  2:11 AM  Result Value Ref Range Status   Enterococcus faecalis NOT DETECTED NOT DETECTED Final   Enterococcus Faecium NOT DETECTED NOT DETECTED Final   Listeria monocytogenes NOT DETECTED NOT DETECTED Final   Staphylococcus species DETECTED (A) NOT DETECTED Final    Comment: CRITICAL RESULT CALLED TO, READ BACK BY AND VERIFIED WITH: RN JESSICA ELLER 01/07/22@3 :47 BY TW    Staphylococcus aureus (BCID) NOT DETECTED NOT DETECTED Final   Staphylococcus epidermidis NOT DETECTED NOT DETECTED Final   Staphylococcus lugdunensis NOT DETECTED NOT DETECTED Final   Streptococcus species NOT DETECTED NOT DETECTED Final   Streptococcus agalactiae NOT DETECTED NOT DETECTED Final   Streptococcus pneumoniae NOT DETECTED NOT DETECTED Final   Streptococcus pyogenes NOT DETECTED NOT DETECTED Final   A.calcoaceticus-baumannii NOT DETECTED NOT DETECTED Final   Bacteroides fragilis NOT DETECTED NOT DETECTED Final   Enterobacterales NOT DETECTED NOT DETECTED Final   Enterobacter cloacae complex NOT DETECTED NOT DETECTED Final   Escherichia coli NOT DETECTED NOT DETECTED Final   Klebsiella aerogenes NOT DETECTED NOT DETECTED Final   Klebsiella oxytoca NOT DETECTED NOT DETECTED Final   Klebsiella pneumoniae NOT DETECTED NOT DETECTED Final   Proteus species NOT DETECTED NOT DETECTED Final   Salmonella species NOT DETECTED NOT DETECTED  Final   Serratia marcescens NOT DETECTED NOT DETECTED Final   Haemophilus influenzae NOT DETECTED NOT DETECTED Final   Neisseria meningitidis NOT DETECTED NOT DETECTED Final   Pseudomonas aeruginosa NOT DETECTED NOT DETECTED Final   Stenotrophomonas maltophilia NOT DETECTED NOT DETECTED Final   Candida albicans NOT DETECTED NOT DETECTED Final   Candida auris NOT DETECTED NOT DETECTED Final   Candida glabrata NOT DETECTED NOT DETECTED Final   Candida krusei NOT DETECTED NOT DETECTED Final   Candida parapsilosis NOT DETECTED NOT DETECTED Final   Candida tropicalis NOT DETECTED NOT DETECTED Final   Cryptococcus neoformans/gattii  NOT DETECTED NOT DETECTED Final    Comment: Performed at [facility], [place] [address], [place], [place] [address]  MRSA Next Gen by PCR, Nasal     Status: None   Collection Time: 01/06/22  4:59 AM   Specimen: Nasal Mucosa; Nasal Swab  Result Value Ref Range Status   MRSA by PCR Next Gen NOT DETECTED NOT DETECTED Final    Comment: (NOTE) The GeneXpert MRSA Assay (FDA approved for NASAL specimens only), is one component of a comprehensive MRSA colonization surveillance program. It is not intended to diagnose MRSA infection nor to guide or monitor treatment for MRSA infections. Test performance is not FDA approved in patients less than 46 years old. Performed at [facility], [address] [place], [place] [address]     Labs: CBC: Recent Labs  Lab 01/04/22 1752 01/06/22 0211 01/06/22 0426 01/07/22 0421 01/08/22 0430  WBC 5.8 10.6* 9.4 12.1* 14.6*  NEUTROABS  --   --  8.8*  --   --   HGB 10.0* 12.0 10.2* 9.2* 9.5*  HCT 32.9* 40.6 34.6* 30.9* 31.9*  MCV 89.2 89.8 90.3 87.8 86.4  PLT 188 229 174 169 979   Basic Metabolic Panel: Recent Labs  Lab 01/04/22 1752 01/06/22 0211 01/06/22 0426 01/07/22 0421 01/07/22 2046 01/08/22 0430  NA 136 136 138 140  --  139  K 3.8 4.0 3.5 3.7  --  3.5  CL 103 101 105 111  --  106  CO2 27 25 20* 23  --  26   GLUCOSE 78 160* 148* 110*  --  118*  BUN 21 26* 25* 21  --  17  CREATININE 0.88 1.24* 1.08* 0.66  --  0.62  CALCIUM 9.2 9.0 8.2* 7.7*  --  8.2*  MG  --   --  2.3  --  2.0 2.0   Liver Function Tests: Recent Labs  Lab 01/06/22 0211 01/06/22 0426 01/07/22 0421 01/08/22 0430  AST 28 22 19 19   ALT 20 16 19 20   ALKPHOS 82 64 57 65  BILITOT 1.0 0.5 0.4 0.4  PROT 7.4 6.6 6.1* 6.6  ALBUMIN 4.0 3.2* 2.9* 3.1*   CBG: No results for input(s): GLUCAP in the last 168 hours.  Discharge time spent: greater than 30 minutes.  Signed: Orson Eva, MD [facility] 01/08/2022

## 2022-01-08 NOTE — TOC Transition Note (Signed)
Transition of Care [facility]) - CM/SW Discharge Note   Patient Details  Name: [name] MRN: [id number] Date of Birth: 07/17/1941  [facility] CM/SW Contact:  Iona Beard, [place] Phone Number: 01/08/2022, 11:12 AM   Clinical Narrative:    TOC updated that pt is ready for D/C back to facility today. CSW spoke to [place] at [facility] who states that they are prepared for pt to return. CSW left VM for pts daughter to make aware of D/C and asked that she return call if needed. TOC signing off.   Final next level of care: Long Term Nursing Home Barriers to Discharge: No Barriers Identified   Patient Goals and CMS Choice Patient states their goals for this hospitalization and ongoing recovery are:: Return to [facility] CMS Medicare.gov Compare Post Acute Care list provided to:: Patient Choice offered to / list presented to : Patient  Discharge Placement                Patient to be transferred to facility by: staff Name of family member notified: Hollyn, [place] (Daughter)   [phone] Patient and family notified of of transfer: 01/08/22  Discharge Plan and Services                                     Social Determinants of Health (SDOH) Interventions     Readmission Risk Interventions No flowsheet data found.

## 2022-01-08 NOTE — Evaluation (Signed)
Physical Therapy Evaluation Patient Details Name: [name] MRN: [id number] DOB: [birth year] Today's Date: 01/08/2022  History of Present Illness  [name] is a 81 y.o. female with medical history significant of COPD on 2 L nasal cannula at baseline, severe dementia without behavioral disturbance, hypertension, hyperlipidemia, stage III CKD, and more presents ED with a chief complaint of hypoxia.  Unfortunately patient is not able to provide any pain or dementia and being on the BiPAP.  Was noted that she was being treated for [place] outpatient, her course of molnupiravir was set to finish on 25 February.  Today she became hypoxic, and the nursing facility called EMS for respiratory distress.  EMS put her on 15 L nonrebreather when she arrived to the hospital she was only satting 87%.  She was transitioned to BiPAP 50% FiO2 and her sats improved.  When she arrived at the hospital she was also febrile, tachycardic, and tachypneic.  Sepsis protocol was started with an assumed pneumonia so Rocephin and Zithromax were started.  Chest x-ray does show mild bibasilar infiltrates greater on right than left.  Patient did arrive with her DNR form.  Review of systems could not be performed secondary to dementia and patient currently being on BiPAP.   Clinical Impression  Patient demonstrates labored movement for sitting up at bedside, had difficulty attempting to transfer to chair without AD due to weakness, had to use RW to complete stand pivot with inability to fully extend knees due to weakness/poor standing balance.  Patient tolerated sitting up in chair after therapy - RN aware.  Patient will benefit from continued skilled physical therapy in hospital and recommended venue below to increase strength, balance, endurance for safe ADLs and gait.         Recommendations for follow up therapy are one component of a multi-disciplinary discharge planning process, led by the attending physician.   Recommendations may be updated based on patient status, additional functional criteria and insurance authorization.  Follow Up Recommendations Skilled nursing-short term rehab (<3 hours/day)    Assistance Recommended at Discharge Intermittent Supervision/Assistance  Patient can return home with the following  A lot of help with walking and/or transfers;A lot of help with bathing/dressing/bathroom;Help with stairs or ramp for entrance;Assistance with cooking/housework    Equipment Recommendations None recommended by PT  Recommendations for Other Services       Functional Status Assessment Patient has had a recent decline in their functional status and/or demonstrates limited ability to make significant improvements in function in a reasonable and predictable amount of time     Precautions / Restrictions Precautions Precautions: Fall Restrictions Weight Bearing Restrictions: No      Mobility  Bed Mobility Overal bed mobility: Needs Assistance Bed Mobility: Supine to Sit     Supine to sit: Min assist, Mod assist     General bed mobility comments: increased time, labored movement    Transfers Overall transfer level: Needs assistance Equipment used: Rolling walker (2 wheels) Transfers: Sit to/from Stand, Bed to chair/wheelchair/BSC Sit to Stand: Mod assist Stand pivot transfers: Mod assist         General transfer comment: slow unsteady labored movement    Ambulation/Gait Ambulation/Gait assistance: Max assist Gait Distance (Feet): 2 Feet Assistive device: Rolling walker (2 wheels) Gait Pattern/deviations: Decreased step length - left, Decreased stance time - right, Decreased stride length Gait velocity: slow     General Gait Details: limited to a couple of slow labored side steps with inability to fully  extend knees due to weakness  Stairs            Wheelchair Mobility    Modified Rankin (Stroke Patients Only)       Balance Overall balance  assessment: Needs assistance Sitting-balance support: Feet supported, No upper extremity supported Sitting balance-Leahy Scale: Fair Sitting balance - Comments: fair/good seated at EOB   Standing balance support: Reliant on assistive device for balance, During functional activity, Bilateral upper extremity supported Standing balance-Leahy Scale: Poor Standing balance comment: using RW                             Pertinent Vitals/Pain Pain Assessment Pain Assessment: No/denies pain    Home Living Family/patient expects to be discharged to:: Skilled nursing facility                        Prior Function Prior Level of Function : Needs assist       Physical Assist : Mobility (physical);ADLs (physical) Mobility (physical): Bed mobility;Transfers;Gait;Stairs   Mobility Comments: Assisted tranfers to wheelchair, propells self in wheelchair ADLs Comments: assisted by SNF staff     Hand Dominance   Dominant Hand: Right    Extremity/Trunk Assessment   Upper Extremity Assessment Upper Extremity Assessment: Generalized weakness    Lower Extremity Assessment Lower Extremity Assessment: Generalized weakness    Cervical / Trunk Assessment Cervical / Trunk Assessment: Normal  Communication   Communication: No difficulties  Cognition Arousal/Alertness: Awake/alert Behavior During Therapy: WFL for tasks assessed/performed Overall Cognitive Status: History of cognitive impairments - at baseline                                          General Comments      Exercises     Assessment/Plan    PT Assessment Patient needs continued PT services  PT Problem List Decreased strength;Decreased activity tolerance;Decreased balance;Decreased mobility       PT Treatment Interventions DME instruction;Functional mobility training;Therapeutic activities;Therapeutic exercise;Wheelchair mobility training;Patient/family education;Gait training;Balance  training    PT Goals (Current goals can be found in the Care Plan section)  Acute Rehab PT Goals Patient Stated Goal: return to LTC PT Goal Formulation: With patient Time For Goal Achievement: 01/22/22 Potential to Achieve Goals: Good    Frequency Min 2X/week     Co-evaluation               AM-PAC PT "6 Clicks" Mobility  Outcome Measure Help needed turning from your back to your side while in a flat bed without using bedrails?: A Little Help needed moving from lying on your back to sitting on the side of a flat bed without using bedrails?: A Lot Help needed moving to and from a bed to a chair (including a wheelchair)?: A Lot Help needed standing up from a chair using your arms (e.g., wheelchair or bedside chair)?: A Lot Help needed to walk in hospital room?: Total Help needed climbing 3-5 steps with a railing? : Total 6 Click Score: 11    End of Session   Activity Tolerance: Patient tolerated treatment well;Patient limited by fatigue Patient left: in chair;with call bell/phone within reach;with chair alarm set Nurse Communication: Mobility status PT Visit Diagnosis: Unsteadiness on feet (R26.81);Other abnormalities of gait and mobility (R26.89);Muscle weakness (generalized) (M62.81)    Time: 2947-6546 PT  Time Calculation (min) (ACUTE ONLY): 31 min   Charges:   PT Evaluation $PT Eval Moderate Complexity: 1 Mod PT Treatments $Therapeutic Activity: 23-37 mins        11:37 AM, 01/08/22 Lonell Grandchild, MPT Physical Therapist with [facility] [phone] office [phone] mobile phone

## 2022-01-11 ENCOUNTER — Non-Acute Institutional Stay (SKILLED_NURSING_FACILITY): Payer: Medicare PPO | Admitting: Adult Health

## 2022-01-11 ENCOUNTER — Telehealth: Payer: Self-pay | Admitting: *Deleted

## 2022-01-11 ENCOUNTER — Encounter: Payer: Self-pay | Admitting: Adult Health

## 2022-01-11 DIAGNOSIS — U071 COVID-19: Secondary | ICD-10-CM | POA: Diagnosis not present

## 2022-01-11 DIAGNOSIS — A419 Sepsis, unspecified organism: Secondary | ICD-10-CM

## 2022-01-11 DIAGNOSIS — K219 Gastro-esophageal reflux disease without esophagitis: Secondary | ICD-10-CM | POA: Diagnosis not present

## 2022-01-11 DIAGNOSIS — M810 Age-related osteoporosis without current pathological fracture: Secondary | ICD-10-CM | POA: Diagnosis not present

## 2022-01-11 DIAGNOSIS — F0150 Vascular dementia without behavioral disturbance: Secondary | ICD-10-CM

## 2022-01-11 DIAGNOSIS — J181 Lobar pneumonia, unspecified organism: Secondary | ICD-10-CM

## 2022-01-11 DIAGNOSIS — F324 Major depressive disorder, single episode, in partial remission: Secondary | ICD-10-CM

## 2022-01-11 DIAGNOSIS — J9621 Acute and chronic respiratory failure with hypoxia: Secondary | ICD-10-CM

## 2022-01-11 DIAGNOSIS — R7989 Other specified abnormal findings of blood chemistry: Secondary | ICD-10-CM

## 2022-01-11 DIAGNOSIS — J441 Chronic obstructive pulmonary disease with (acute) exacerbation: Secondary | ICD-10-CM

## 2022-01-11 DIAGNOSIS — N1831 Chronic kidney disease, stage 3a: Secondary | ICD-10-CM | POA: Diagnosis not present

## 2022-01-11 DIAGNOSIS — J9622 Acute and chronic respiratory failure with hypercapnia: Secondary | ICD-10-CM

## 2022-01-11 DIAGNOSIS — M159 Polyosteoarthritis, unspecified: Secondary | ICD-10-CM

## 2022-01-11 DIAGNOSIS — R6521 Severe sepsis with septic shock: Secondary | ICD-10-CM

## 2022-01-11 DIAGNOSIS — E785 Hyperlipidemia, unspecified: Secondary | ICD-10-CM

## 2022-01-11 DIAGNOSIS — J1282 Pneumonia due to coronavirus disease 2019: Secondary | ICD-10-CM

## 2022-01-11 LAB — CULTURE, BLOOD (ROUTINE X 2)
Culture: NO GROWTH
Special Requests: ADEQUATE

## 2022-01-11 NOTE — Progress Notes (Signed)
Location:  [facility] Room Number: 149 Place of Service:  SNF (31) Provider:  Ok Edwards, NP   CODE STATUS: DNR  Allergies  Allergen Reactions   Fenofibrate Nausea And Vomiting   Pravastatin Sodium Nausea And Vomiting   Sulfonamide Derivatives Other (See Comments)    unknown   Ciprofloxacin Rash    Chief Complaint  Patient presents with   Hospitalization [facility] follow up.    HPI:  She is a 81 year old long term resident of this facility being seen for her hospitalization from 01-06-22 through 01-08-22. Her medical history includes: COPD; dementia; CKD OSA not on cpap. She had developed covid was on antiviral treatment and was sent to the ED due to respiratory distress. Her 02 was increased to 5 liters. She was initially on a non-rebreather then placed on BiPAP. She was given remdesivir and solumedrol. She was treated for septic shock; severe sepsis secondary to pneumonia. Her 02 was weaned to 4 liters her normal dose is 2 liters. She will need 5 more days of steroids. She is a long term patient. She does not remember being hospitalized. States that she feels good but tired. She states that she slept good last night and ate breakfast this morning. She will continue to be followed for her chronic illnesses including: Essential hypertension: Esophageal reflux disase without esophagitis:  Vascular dementia: without behavioral disturbance Generalized osteoarthritis of multiple sites:       Past Medical History:  Diagnosis Date   Abnormality of gait 06/06/2014   Allergic rhinitis    Anemia    Ankle fracture, right    Anxiety    Arm fracture, left    Arthritis    abnormal gait    COPD (chronic obstructive pulmonary disease) (HCC)    chronic CO2 retention, decreased DLCO    Cystic acne    adult    Degenerative disc disease, cervical    syrinx C3-7   Degenerative disc disease, lumbar    L5 nerve impingement    Dementia (HCC)    Depression     DNR (do not resuscitate)    Fracture of right proximal fibula 07/13/18 09/05/2018   GERD (gastroesophageal reflux disease)    Heme positive stool 10/10/2017   History of colon surgery    right hemicolectomy for high-grade adenomas in right colon 2013   Hyperglycemia    Hypertension    Low back pain    Mediastinal lymphadenopathy 1/9   resolving    Memory difficulty 09/20/2014   mild dementia   Onychomycosis    OSA on CPAP    Sleep apnea    stop bang score 6    Past Surgical History:  Procedure Laterality Date   ABDOMINAL HYSTERECTOMY  1976   secondary to bleeding    APPENDECTOMY     COLON RESECTION  08/18/2012   Procedure: HAND ASSISTED LAPAROSCOPIC COLON RESECTION;  Surgeon: Jamesetta So, MD;  Location: [facility];  Service: General;  Laterality: N/A;   COLONOSCOPY  07/20/2012   Dr. Gala Romney: multiple colonic polyps with large polyps on right side, s/p saline-assisted debulking piecemeal polypectomy and ablation. Not all removed. Path with tubulovillous adenomas, high grade dysplasia.    COLONOSCOPY N/A 03/09/2013   DPO:EUMPNTI polyps-tubular adenomas. S/p right hemicolectomy. next tcs 02/2018   epidural steroids     ESOPHAGOGASTRODUODENOSCOPY (EGD) WITH ESOPHAGEAL DILATION N/A 02/06/2014   Procedure: ESOPHAGOGASTRODUODENOSCOPY (EGD) WITH ESOPHAGEAL DILATION;  Surgeon: Daneil Dolin, MD;  Location: [facility] SUITE;  Service: Endoscopy;  Laterality: N/A;  9:30   OOPHORECTOMY  1976   ORIF ANKLE FRACTURE  01/18/2012   Procedure: OPEN REDUCTION INTERNAL FIXATION (ORIF) ANKLE FRACTURE;  Surgeon: Arther Abbott, MD;  Location: [facility];  Service: Orthopedics;  Laterality: Left;   ORIF ANKLE FRACTURE Right 01/13/2016   Procedure: OPEN REDUCTION INTERNAL FIXATION (ORIF) ANKLE FRACTURE;  Surgeon: Carole Civil, MD;  Location: [facility];  Service: Orthopedics;  Laterality: Right;    Social History   Socioeconomic History   Marital status: Widowed    Spouse name: Not on file   Number of  children: 4   Years of education: college 1   Highest education level: Not on file  Occupational History   Occupation: employed in the tobacco industry     Employer: RETIRED   Occupation: retired  Tobacco Use   Smoking status: Former    Packs/day: 1.50    Years: 40.00    Pack years: 60.00    Types: Cigarettes    Quit date: 06/21/2006    Years since quitting: 15.5   Smokeless tobacco: Never  Vaping Use   Vaping Use: Never used  Substance and Sexual Activity   Alcohol use: No   Drug use: No   Sexual activity: Not Currently  Other Topics Concern   Not on file  Social History Narrative   Long term resident of [facility]    Social Determinants of Health   Financial Resource Strain: Not on file  Food Insecurity: Not on file  Transportation Needs: Not on file  Physical Activity: Not on file  Stress: Not on file  Social Connections: Not on file  Intimate Partner Violence: Not on file   Family History  Problem Relation Age of Onset   Stomach cancer Father    Cancer Father    Cancer Mother        brain    Breast cancer Mother    Arthritis Other       VITAL SIGNS BP 124/67    Pulse 84    Temp (!) 97.5 F (36.4 C)    Resp 20    Ht 6\' 1"  (1.854 m)    Wt 163 lb 4.8 oz (74.1 kg)    SpO2 91%    BMI 21.54 kg/m   Outpatient Encounter Medications as of 01/11/2022  Medication Sig Note   acetaminophen (TYLENOL) 325 MG tablet Take 650 mg by mouth every 8 (eight) hours as needed.    albuterol (VENTOLIN HFA) 108 (90 Base) MCG/ACT inhaler Inhale 2 puffs into the lungs every 6 (six) hours as needed for wheezing or shortness of breath.    alendronate (FOSAMAX) 70 MG tablet Take 70 mg by mouth once a week. Take on an empty stomach at least 30 mns prior to food. Sit upright x 9mns after taking meds. 01/06/2022: Sundays    atorvastatin (LIPITOR) 10 MG tablet TAKE 1 TABLET EVERY DAY    azithromycin (ZITHROMAX) 500 MG tablet Take 1 tablet (500 mg total) by mouth daily. X 4 days    CALCIUM  CARBONATE-VITAMIN D PO Take by mouth. 600 mg-10 mcg, oral, Once A Day    cefdinir (OMNICEF) 300 MG capsule Take 1 capsule (300 mg total) by mouth every 12 (twelve) hours. X 4 days    Cholecalciferol (VITAMIN D3 PO) Take 1,000 Units by mouth daily.    donepezil (ARICEPT) 10 MG tablet TAKE 1 TABLET AT BEDTIME    ergocalciferol (VITAMIN D2) 1.25  MG (50000 UT) capsule Take 50,000 Units by mouth once a week.    guaiFENesin (MUCINEX) 600 MG 12 hr tablet Take by mouth 2 (two) times daily.    loperamide (IMODIUM A-D) 2 MG tablet Take 2 mg by mouth as needed for diarrhea or loose stools.    loratadine (CLARITIN) 10 MG tablet Take 10 mg by mouth daily.    mirabegron ER (MYRBETRIQ) 50 MG TB24 tablet Take 50 mg by mouth daily.    NON FORMULARY Diet-regular    omeprazole (PRILOSEC) 40 MG capsule TAKE 1 CAPSULE EVERY DAY    OXYGEN Inhale 2 L into the lungs continuous.    potassium chloride (KLOR-CON) 20 MEQ packet Take 20 mEq by mouth daily.    predniSONE (DELTASONE) 20 MG tablet Take 3 tablets (60 mg total) by mouth daily with breakfast. X 5 days    STRIVERDI RESPIMAT 2.5 MCG/ACT AERS INHALE 2 PUFFS ONE TIME DAILY AT THE SAME TIME    verapamil (CALAN-SR) 240 MG CR tablet TAKE 1 TABLET AT BEDTIME    vitamin C (ASCORBIC ACID) 500 MG tablet Take 500 mg by mouth 2 (two) times daily.    Zinc Sulfate 220 (50 Zn) MG TABS Take 1 tablet by mouth daily.    [DISCONTINUED] Homeopathic Products (ZINC) LOZG 50 mg zinc (220 mg); oral Once A Day    No facility-administered encounter medications on file as of 01/11/2022.     SIGNIFICANT DIAGNOSTIC EXAMS   LABS REVIEWED PREVIOUS  01-06-21: wbc 6.1; hgb 11.3; hct 37.1; mcv 88; plt 278; glucose 150; bun 24; creat 1.25; k+ 3.2; na++ 148; ca 8.9; GFR 47; liver normal albumin 3.7; chol 152; ldl 61; trig 125; hdl 69; vit D 63.3; tsh 0.407; free T4; 0.84 03-30-21: wbc 5.5; hgb 8.9; hct 30.2; mcv 92.9 plt 210; glucose 78; bun 23; creat 0.86; k+ 3.5; na++ 143; ca 8.6 GFR>60;  liver normal albumin 2.9; chol 141; ldl 65; trig 67; hdl 63 06-02-21: urine culture: no growth; hep C neg 07-21-21: wbc 4.8; hgb 10.0; hct 34.4; mcv 87.3 plt 224; glucose 138; bun 22; creat 0.87; k+ 3.4; na++ 130; ca 8.5; GFR>60 07-27-21: albumin 3.7; k+ 3.9 09-02-21: glucose 84; bun 18; creat 0.77; k+ 3.7; na++ 139; ca 9.0; GFR>60; liver normal albumin 4.0 tsh 1.075 free t4: 0.93  12-03-21: wbc 3.6; hgb 10.3; hct 33.9; mcv 88.3 plt 220; glucose 87; bun 19; creat 0.92; k+ 4.1; na++ 140; ca 8.9; >60; total protein 6.1 albumin 3.6 01-04-22: wbc 5.8; hgb 10.0; hct 32.9; mcv 89.2 plt 188; glucose 78; bun 0.88; creat 0.88; k+ 3.8; na++ 136; ca 9.2; GFR >60 d-dimer 0.54; CRP 1.6   TODAY  01-04-22 (ED) wbc10.6; hgb 12.0; hct 40.6; mcv89.8 plt 229; glucose 160; bun 26; creat 1.24; k+ 4.0; na++ 136; ca 9.0; GFR 44; protein 7.4 albumin 4.0 01-08-31: wbc 14.6; hgb 9.5; hct 31.9; mcv 86.4 plt 219; glucose 118; bun 17; creat 0.62; k+ 3.5; na++ 139; ca 8.2; GFR>60 protein 6.6; albumin 3.1; mag 2.0; CRP 12.2; d-dimer 0.77   Review of Systems  Constitutional:  Positive for malaise/fatigue.  Respiratory:  Positive for cough. Negative for shortness of breath.   Cardiovascular:  Negative for chest pain, palpitations and leg swelling.  Gastrointestinal:  Negative for abdominal pain, constipation and heartburn.  Musculoskeletal:  Negative for back pain, joint pain and myalgias.  Skin: Negative.   Neurological:  Negative for dizziness.  Psychiatric/Behavioral:  The patient is not nervous/anxious.  Physical Exam Constitutional:      General: She is not in acute distress.    Appearance: She is well-developed. She is not diaphoretic.  Neck:     Thyroid: No thyromegaly.  Cardiovascular:     Rate and Rhythm: Normal rate and regular rhythm.     Pulses: Normal pulses.     Heart sounds: Murmur heard.     Comments: 2/6 Pulmonary:     Effort: Pulmonary effort is normal. No respiratory distress.     Breath sounds:  Normal breath sounds.     Comments: 02 Abdominal:     General: Bowel sounds are normal. There is no distension.     Palpations: Abdomen is soft.     Tenderness: There is no abdominal tenderness.  Musculoskeletal:        General: Normal range of motion.     Cervical back: Neck supple.     Right lower leg: No edema.     Left lower leg: No edema.  Lymphadenopathy:     Cervical: No cervical adenopathy.  Skin:    General: Skin is warm and dry.  Neurological:     Mental Status: She is alert. Mental status is at baseline.  Psychiatric:        Mood and Affect: Mood normal.     ASSESSMENT AND PLAN   TODAY  Severe sepsis with septic shock/ pneumonia due to covid 19/lobular pneumonia  is presently stable will continue omnicef 300 mg twice daily for 5 days and zithromax 500 mg daily for 5 days.   2. Acute on chronic respiratory failure with hypoxia and hypercapnia: acute COPD exacerbation:  is presently on 4L [place]  (baseline is 2L ) will continue albuterol 2 puffs every 4 hours as needed; striveridi respimat 2.5 mcg 2 puffs daily; is presently on a prednisone taper 60 mg daily for 5 days. Mucines 600 mg twice daily claritin 10 mg daily   3. Essential hypertension: is stable b/p 124/67 will continue calan sr 240 mg daily is off norvasc  4. Esophageal reflux disase without esophagitis: is stable will continue prilosec 40 mg daily   5. Vascular dementia: without behavioral disturbance she has lost weight due to recent illnesses at 157 pounds; will continue aricept 10 mg daily  6.  Generalized osteoarthritis of multiple sites: has tylenol 650 mg every 8 hours as needed  7. Age related osteoporosis without current pathological fracture: is stable will continue fosamax 70 mg weekly   8. Stage 3a chronic kidney disease: is stable bun 17; creat 0.62 GFR >60  9. Normocytic anemia: is stable hgb is 9.5 will monitor   10.  Vitamin D deficiency: is stable will continue vitamin D 50,000 units weekly    11. Hyperlipidemia LDL goal <100: will continue lipitor 10 mg daily LDL 65  12. Low thyroid stimulation hormone (TSH) is stable tsh 1.075 free t4: 0.93 will monitor  13. 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 Will monitor   14. Urinary frequency: is stable will continue myrbetriq 50 mg daily       Ok Edwards NP [facility]  call [phone]

## 2022-01-11 NOTE — Telephone Encounter (Signed)
Transition Care Management Unsuccessful Follow-up Telephone Call  Date of discharge and from where:  01/08/2022 [facility]  Attempts:  1st Attempt  Reason for unsuccessful TCM follow-up call:  Unable to reach patient [facility] SN[facility]

## 2022-01-12 ENCOUNTER — Non-Acute Institutional Stay (SKILLED_NURSING_FACILITY): Payer: Medicare PPO | Admitting: Internal Medicine

## 2022-01-12 ENCOUNTER — Encounter: Payer: Self-pay | Admitting: Internal Medicine

## 2022-01-12 DIAGNOSIS — U071 COVID-19: Secondary | ICD-10-CM | POA: Diagnosis not present

## 2022-01-12 DIAGNOSIS — F0390 Unspecified dementia without behavioral disturbance: Secondary | ICD-10-CM

## 2022-01-12 DIAGNOSIS — R627 Adult failure to thrive: Secondary | ICD-10-CM

## 2022-01-12 DIAGNOSIS — J1282 Pneumonia due to coronavirus disease 2019: Secondary | ICD-10-CM

## 2022-01-12 NOTE — Patient Instructions (Signed)
See assessment and plan under each diagnosis in the problem list and acutely for this visit 

## 2022-01-12 NOTE — Assessment & Plan Note (Signed)
She is unaware of having had COVID with sepsis 2/22 - 01/04/2022.  She can provide no meaningful history.  She presently describes her respiratory status as "slow".

## 2022-01-12 NOTE — Assessment & Plan Note (Signed)
Completing prednisone and dual oral antibiotic therapy at SNF.  O2 sats 94% on nasal oxygen.

## 2022-01-12 NOTE — Progress Notes (Signed)
NURSING HOME LOCATION: [facility] ROOM NUMBER:  [id number] W  CODE STATUS:  DNR  PCP:  Ok Edwards NP,PSC  This is a nursing facility follow up visit for [place] readmission within 30 days  Interim medical record and care since last SNF visit was updated with review of diagnostic studies and change in clinical status since last visit were documented.  HPI: She was rehospitalized 2/22 - 01/08/2022 with hypoxemia with O2 sats in the 60% plus range despite 5 L of nasal oxygen by cannula.  Upon arrival to ED O2 sats were 87% on 15 L nonrebreather.  Nonbreathing was transitioned to BiPAP in the ED.  The patient was awake and alert on BiPAP but not answering questions.  This is in the context of advanced dementia. A course of molnupiravir had been initiated 2/20 for + Covid nasal swab screen.  As expected COVID-19 PCR was positive in the ED.  She was febrile with a temp of 101.1 and exhibited tachycardia with heart rates in the 110s.  Blood pressures are soft with systolics in the 812+ range.  Chest x-ray revealed bibasilar infiltrates, right greater than left.  Clinically she had severe sepsis with septic shock secondary to COVID-related pneumonia.  Lactic acid level was 3.1; PCT was 1.38 and peaked at 3.04.  CRP was 1.6 but peaked at 21.4.  D-dimer ranged from a low of 0.54 up to 0.92. She received 2.5 L of fluid and started on ceftriaxone and azithromycin.  She was admitted to the SDU while on BiPAP.  IV Solu-Medrol and remdesivir were initiated.  She received Levophed for the hypotension; this was weaned as of 2/22 after resolution of the sepsis physiologic abnormalities.  Solu-Medrol was transitioned to oral prednisone for additional 5 days postdischarge.  Ceftriaxone and azithromycin were transitioned to 4 additional days of cefdinir and azithromycin at discharge. Course was complicated by SVT for which verapamil was reinitiated and titrated. With above therapies she was  clinically improved and stable enough for return to the SNF.  Review of systems: Dementia invalidated responses.  She had no idea while she had been hospitalized beyond "all the coughing".  She was unaware of the COVID diagnosis.  When asked how she was doing at this time her response was "doing all right".  When I asked about shortness of breath she stated it is "slow".  Physical exam:  Pertinent or positive findings: She is wearing nasal oxygen.  Voice is weak.  She does not focus on the examiner.  She is edentulous.  Breath sounds are decreased.  She exhibits an intermittent coarse, nonproductive cough.  Heart rhythm is slightly tachy and is slightly irregular.  Dorsalis pedis pulses are stronger than the posterior tibial pulses.  Limb atrophy and interosseous wasting present.  General appearance: no acute distress, increased work of breathing is present.   Lymphatic: No lymphadenopathy about the head, neck, axilla. Eyes: No conjunctival inflammation or lid edema is present. There is no scleral icterus. Ears:  External ear exam shows no significant lesions or deformities.   Nose:  External nasal examination shows no deformity or inflammation. Nasal mucosa are pink and moist without lesions, exudates Oral exam:  Lips and gums are healthy appearing. There is no oropharyngeal erythema or exudate. Neck:  No thyromegaly, masses, tenderness noted.    Heart:  No murmur, click, rub .  Lungs:  without wheezes, rhonchi, rales, rubs. Abdomen: Bowel sounds are normal. Abdomen is soft and nontender with no organomegaly,  hernias, masses. GU: Deferred  Extremities:  No cyanosis, clubbing, edema  Neurologic exam :Balance, Rhomberg, finger to nose testing could not be completed due to clinical state Skin: Warm & dry w/o tenting. No significant lesions or rash.  See summary under each active problem in the Problem List with associated updated therapeutic plan

## 2022-01-13 DIAGNOSIS — R627 Adult failure to thrive: Secondary | ICD-10-CM | POA: Insufficient documentation

## 2022-01-13 DIAGNOSIS — F0150 Vascular dementia without behavioral disturbance: Secondary | ICD-10-CM | POA: Insufficient documentation

## 2022-01-13 NOTE — Assessment & Plan Note (Addendum)
Adult failure to thrive is multifactorial.  Although albumin is low normal at 3.1 and total protein  6.6; she exhibits limb atrophy and interosseous wasting. ?Nutrition will continue to follow at SNF. ?She is at high risk for aspiration due to her oxygen dependent COPD with ongoing cough. ?

## 2022-01-19 DIAGNOSIS — J1281 Pneumonia due to SARS-associated coronavirus: Secondary | ICD-10-CM | POA: Diagnosis not present

## 2022-01-19 DIAGNOSIS — J449 Chronic obstructive pulmonary disease, unspecified: Secondary | ICD-10-CM | POA: Diagnosis not present

## 2022-01-19 DIAGNOSIS — Z1211 Encounter for screening for malignant neoplasm of colon: Secondary | ICD-10-CM | POA: Diagnosis not present

## 2022-01-19 DIAGNOSIS — R6521 Severe sepsis with septic shock: Secondary | ICD-10-CM | POA: Diagnosis not present

## 2022-01-19 DIAGNOSIS — R1312 Dysphagia, oropharyngeal phase: Secondary | ICD-10-CM | POA: Diagnosis not present

## 2022-01-19 DIAGNOSIS — M6281 Muscle weakness (generalized): Secondary | ICD-10-CM | POA: Diagnosis not present

## 2022-01-19 DIAGNOSIS — J9621 Acute and chronic respiratory failure with hypoxia: Secondary | ICD-10-CM | POA: Diagnosis not present

## 2022-01-19 DIAGNOSIS — R293 Abnormal posture: Secondary | ICD-10-CM | POA: Diagnosis not present

## 2022-01-19 DIAGNOSIS — U071 COVID-19: Secondary | ICD-10-CM | POA: Diagnosis not present

## 2022-01-19 DIAGNOSIS — Z1212 Encounter for screening for malignant neoplasm of rectum: Secondary | ICD-10-CM | POA: Diagnosis not present

## 2022-01-19 DIAGNOSIS — R2681 Unsteadiness on feet: Secondary | ICD-10-CM | POA: Diagnosis not present

## 2022-01-20 ENCOUNTER — Other Ambulatory Visit (HOSPITAL_COMMUNITY): Payer: Self-pay | Admitting: Internal Medicine

## 2022-01-20 ENCOUNTER — Other Ambulatory Visit: Payer: Self-pay | Admitting: Internal Medicine

## 2022-01-20 DIAGNOSIS — M6281 Muscle weakness (generalized): Secondary | ICD-10-CM | POA: Diagnosis not present

## 2022-01-20 DIAGNOSIS — R293 Abnormal posture: Secondary | ICD-10-CM | POA: Diagnosis not present

## 2022-01-20 DIAGNOSIS — R1312 Dysphagia, oropharyngeal phase: Secondary | ICD-10-CM

## 2022-01-20 DIAGNOSIS — J1281 Pneumonia due to SARS-associated coronavirus: Secondary | ICD-10-CM | POA: Diagnosis not present

## 2022-01-20 DIAGNOSIS — J449 Chronic obstructive pulmonary disease, unspecified: Secondary | ICD-10-CM | POA: Diagnosis not present

## 2022-01-20 DIAGNOSIS — R6521 Severe sepsis with septic shock: Secondary | ICD-10-CM | POA: Diagnosis not present

## 2022-01-20 DIAGNOSIS — J9621 Acute and chronic respiratory failure with hypoxia: Secondary | ICD-10-CM | POA: Diagnosis not present

## 2022-01-20 DIAGNOSIS — U071 COVID-19: Secondary | ICD-10-CM | POA: Diagnosis not present

## 2022-01-20 DIAGNOSIS — R2681 Unsteadiness on feet: Secondary | ICD-10-CM | POA: Diagnosis not present

## 2022-01-21 ENCOUNTER — Encounter: Payer: Self-pay | Admitting: Adult Health

## 2022-01-21 ENCOUNTER — Non-Acute Institutional Stay (SKILLED_NURSING_FACILITY): Payer: Medicare PPO | Admitting: Adult Health

## 2022-01-21 DIAGNOSIS — R6521 Severe sepsis with septic shock: Secondary | ICD-10-CM | POA: Diagnosis not present

## 2022-01-21 DIAGNOSIS — J449 Chronic obstructive pulmonary disease, unspecified: Secondary | ICD-10-CM | POA: Diagnosis not present

## 2022-01-21 DIAGNOSIS — M6281 Muscle weakness (generalized): Secondary | ICD-10-CM | POA: Diagnosis not present

## 2022-01-21 DIAGNOSIS — F0150 Vascular dementia without behavioral disturbance: Secondary | ICD-10-CM

## 2022-01-21 DIAGNOSIS — R634 Abnormal weight loss: Secondary | ICD-10-CM | POA: Diagnosis not present

## 2022-01-21 DIAGNOSIS — Z8616 Personal history of COVID-19: Secondary | ICD-10-CM | POA: Diagnosis not present

## 2022-01-21 DIAGNOSIS — J9621 Acute and chronic respiratory failure with hypoxia: Secondary | ICD-10-CM | POA: Diagnosis not present

## 2022-01-21 DIAGNOSIS — R293 Abnormal posture: Secondary | ICD-10-CM | POA: Diagnosis not present

## 2022-01-21 DIAGNOSIS — U071 COVID-19: Secondary | ICD-10-CM | POA: Diagnosis not present

## 2022-01-21 DIAGNOSIS — R2681 Unsteadiness on feet: Secondary | ICD-10-CM | POA: Diagnosis not present

## 2022-01-21 DIAGNOSIS — J1281 Pneumonia due to SARS-associated coronavirus: Secondary | ICD-10-CM | POA: Diagnosis not present

## 2022-01-21 DIAGNOSIS — R1312 Dysphagia, oropharyngeal phase: Secondary | ICD-10-CM | POA: Diagnosis not present

## 2022-01-21 NOTE — Progress Notes (Signed)
? ?Location:  [place] ?[facility]ing Home Room Number: 149 ?Place of Service:  SNF (31) ? ? ?CODE STATUS: dnr ? ?Allergies  ?Allergen Reactions  ? Fenofibrate Nausea And Vomiting  ? Pravastatin Sodium Nausea And Vomiting  ? Sulfonamide Derivatives Other (See Comments)  ?  unknown  ? Ciprofloxacin Rash  ? ? ?Chief Complaint  ?Patient presents with  ? Acute Visit  ?  Weight loss   ? ? ?HPI: ? ? ? ?Past Medical History:  ?Diagnosis Date  ? Abnormality of gait 06/06/2014  ? Allergic rhinitis   ? Anemia   ? Ankle fracture, right   ? Anxiety   ? Arm fracture, left   ? Arthritis   ? abnormal gait   ? COPD (chronic obstructive pulmonary disease) ([place])   ? chronic CO2 retention, decreased DLCO   ? Cystic acne   ? adult   ? Degenerative disc disease, cervical   ? 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 Degenerative disc disease, lumbar   ? L5 nerve impingement   ? Dementia ([place])   ? Depression   ? DNR (do not resuscitate)   ? Fracture of right proximal fibula 07/13/18 09/05/2018  ? GERD (gastroesophageal reflux disease)   ? Heme positive stool 10/10/2017  ? History of colon surgery   ? right hemicolectomy for high-grade adenomas in right colon 2013  ? Hyperglycemia   ? Hypertension   ? Low back pain   ? Mediastinal lymphadenopathy 1/9  ? resolving   ? Memory difficulty 09/20/2014  ? mild dementia  ? Onychomycosis   ? OSA on CPAP   ? Sleep apnea   ? stop bang score 6  ? ? ?Past Surgical History:  ?Procedure Laterality Date  ? ABDOMINAL HYSTERECTOMY  1976  ? secondary to bleeding   ? APPENDECTOMY    ? COLON RESECTION  08/18/2012  ? Procedure: HAND ASSISTED LAPAROSCOPIC COLON RESECTION;  Surgeon: Jamesetta So, MD;  Location: [facility];  Service: General;  Laterality: N/A;  ? COLONOSCOPY  07/20/2012  ? Dr. Gala Romney: multiple colonic polyps with large polyps on right side, s/p saline-assisted debulking piecemeal polypectomy and ablation. Not all removed. Path with tubulovillous adenomas, high grade dysplasia.   ? COLONOSCOPY N/A 03/09/2013  ? [id number]:HALPFXT  polyps-tubular adenomas. S/p right hemicolectomy. next tcs 02/2018  ? epidural steroids    ? ESOPHAGOGASTRODUODENOSCOPY (EGD) WITH ESOPHAGEAL DILATION N/A 02/06/2014  ? Procedure: ESOPHAGOGASTRODUODENOSCOPY (EGD) WITH ESOPHAGEAL DILATION;  Surgeon: Daneil Dolin, MD;  Location: [facility];  Service: Endoscopy;  Laterality: N/A;  9:30  ? OOPHORECTOMY  1976  ? ORIF ANKLE FRACTURE  01/18/2012  ? Procedure: OPEN REDUCTION INTERNAL FIXATION (ORIF) ANKLE FRACTURE;  Surgeon: Arther Abbott, MD;  Location: [facility];  Service: Orthopedics;  Laterality: Left;  ? ORIF ANKLE FRACTURE Right 01/13/2016  ? Procedure: OPEN REDUCTION INTERNAL FIXATION (ORIF) ANKLE FRACTURE;  Surgeon: Carole Civil, MD;  Location: [facility];  Service: Orthopedics;  Laterality: Right;  ? ? ?Social History  ? ?Socioeconomic History  ? Marital status: Widowed  ?  Spouse name: Not on file  ? Number of children: 4  ? Years of education: college 1  ? Highest education level: Not on file  ?Occupational History  ? Occupation: employed in the tobacco industry   ?  Employer: RETIRED  ? Occupation: retired  ?Tobacco Use  ? Smoking status: Former  ?  Packs/day: 1.50  ?  Years: 40.00  ?  Pack years: 60.00  ?  Types:  Cigarettes  ?  Quit date: 06/21/2006  ?  Years since quitting: 15.5  ? Smokeless tobacco: Never  ?Vaping Use  ? Vaping Use: Never used  ?Substance and Sexual Activity  ? Alcohol use: No  ? Drug use: No  ? Sexual activity: Not Currently  ?Other Topics Concern  ? Not on file  ?Social History Narrative  ? Long term resident of [facility]   ? ?Social Determinants of Health  ? ?Financial Resource Strain: Not on file  ?Food Insecurity: Not on file  ?Transportation Needs: Not on file  ?Physical Activity: Not on file  ?Stress: Not on file  ?Social Connections: Not on file  ?Intimate Partner Violence: Not on file  ? ?Family History  ?Problem Relation Age of Onset  ? Stomach cancer Father   ? Cancer Father   ? Cancer Mother   ?     brain   ? Breast cancer Mother   ?  Arthritis Other   ? ? ? ? ?VITAL SIGNS ?BP 138/81   Pulse 62   Temp 97.8 ?F (36.6 ?C)   Resp 18   Ht '6\' 1"'$  (1.854 m)   Wt 153 lb 12.8 oz (69.8 kg)   SpO2 98%   BMI 20.29 kg/m?  ? ?Outpatient Encounter Medications as of 01/21/2022  ?Medication Sig Note  ? acetaminophen (TYLENOL) 325 MG tablet Take 650 mg by mouth every 8 (eight) hours as needed.   ? albuterol (VENTOLIN HFA) 108 (90 Base) MCG/ACT inhaler Inhale 2 puffs into the lungs every 6 (six) hours as needed for wheezing or shortness of breath.   ? alendronate (FOSAMAX) 70 MG tablet Take 70 mg by mouth once a week. Take on an empty stomach at least 30 mns prior to food. Sit upright x 15ms after taking meds. 01/06/2022: Sundays   ? atorvastatin (LIPITOR) 10 MG tablet TAKE 1 TABLET EVERY DAY   ? azithromycin (ZITHROMAX) 500 MG tablet Take 1 tablet (500 mg total) by mouth daily. X 4 days   ? CALCIUM CARBONATE-VITAMIN D PO Take by mouth. 600 mg-10 mcg, oral, Once A Day   ? cefdinir (OMNICEF) 300 MG capsule Take 1 capsule (300 mg total) by mouth every 12 (twelve) hours. X 4 days   ? Cholecalciferol (VITAMIN D3 PO) Take 1,000 Units by mouth daily.   ? donepezil (ARICEPT) 10 MG tablet TAKE 1 TABLET AT BEDTIME   ? ergocalciferol (VITAMIN D2) 1.25 MG (50000 UT) capsule Take 50,000 Units by mouth once a week.   ? guaiFENesin (MUCINEX) 600 MG 12 hr tablet Take by mouth 2 (two) times daily.   ? loperamide (IMODIUM A-D) 2 MG tablet Take 2 mg by mouth as needed for diarrhea or loose stools.   ? loratadine (CLARITIN) 10 MG tablet Take 10 mg by mouth daily.   ? mirabegron ER (MYRBETRIQ) 50 MG TB24 tablet Take 50 mg by mouth daily.   ? NON FORMULARY Diet-regular   ? omeprazole (PRILOSEC) 40 MG capsule TAKE 1 CAPSULE EVERY DAY   ? OXYGEN Inhale 2 L into the lungs continuous.   ? potassium chloride (KLOR-CON) 20 MEQ packet Take 20 mEq by mouth daily.   ? predniSONE (DELTASONE) 20 MG tablet Take 3 tablets (60 mg total) by mouth daily with breakfast. X 5 days   ? STRIVERDI  RESPIMAT 2.5 MCG/ACT AERS INHALE 2 PUFFS ONE TIME DAILY AT THE SAME TIME   ? verapamil (CALAN-SR) 240 MG CR tablet TAKE 1 TABLET AT BEDTIME   ?  Zinc Sulfate 220 (50 Zn) MG TABS Take 1 tablet by mouth daily.   ? ?No facility-administered encounter medications on file as of 01/21/2022.  ? ? ? ?SIGNIFICANT DIAGNOSTIC EXAMS ? ? ? ? ? ? ?ASSESSMENT/ PLAN: ? ? ? ? ?Ok Edwards NP ?[facility]  ? call [phone]  ? ?

## 2022-01-22 DIAGNOSIS — R634 Abnormal weight loss: Secondary | ICD-10-CM | POA: Insufficient documentation

## 2022-01-22 NOTE — Progress Notes (Signed)
Location:  [place] Room Number: 149 Place of Service:  SNF (31)   CODE STATUS: dnr   Allergies  Allergen Reactions   Fenofibrate Nausea And Vomiting   Pravastatin Sodium Nausea And Vomiting   Sulfonamide Derivatives Other (See Comments)    unknown   Ciprofloxacin Rash    Chief Complaint  Patient presents with   Acute Visit    Weight loss     HPI:  She has been losing weight. She has recently had covid and has been hospitalized for covid pneumonia. She has lost from 157.8 to her current weight of 153.8. her appetite is poor but slowly improving. There are reports of her choking on food ST has seen. She is presently on a mech soft diet with gravy on meats. She will need to be placed on restorative feeding.   Past Medical History:  Diagnosis Date   Abnormality of gait 06/06/2014   Allergic rhinitis    Anemia    Ankle fracture, right    Anxiety    Arm fracture, left    Arthritis    abnormal gait    COPD (chronic obstructive pulmonary disease) (H[facility])    chronic CO2 retention, decreased DLCO    Cystic acne    adult    Degenerative disc disease, cervical    syrinx C3-7   Degenerative disc disease, lumbar    L5 nerve impingement    Dementia ([facility])    Depression    DNR (do not resuscitate)    Fracture of right proximal fibula 07/13/18 09/05/2018   GERD (gastroesophageal reflux disease)    Heme positive stool 10/10/2017   History of colon surgery    right hemicolectomy for high-grade adenomas in right colon 2013   Hyperglycemia    Hypertension    Low back pain    Mediastinal lymphadenopathy 1/9   resolving    Memory difficulty 09/20/2014   mild dementia   Onychomycosis    OSA on CPAP    Sleep apnea    stop bang score 6    Past Surgical History:  Procedure Laterality Date   ABDOMINAL HYSTERECTOMY  1976   secondary to bleeding    APPENDECTOMY     COLON RESECTION  08/18/2012   Procedure: HAND ASSISTED LAPAROSCOPIC COLON RESECTION;   Surgeon: Jamesetta So, MD;  Location: [facility];  Service: General;  Laterality: N/A;   COLONOSCOPY  07/20/2012   Dr. Gala Romney: multiple colonic polyps with large polyps on right side, s/p saline-assisted debulking piecemeal polypectomy and ablation. Not all removed. Path with tubulovillous adenomas, high grade dysplasia.    COLONOSCOPY N/A 03/09/2013   GBT:[id number] polyps-tubular adenomas. S/p right hemicolectomy. next tcs 02/2018   epidural steroids     ESOPHAGOGASTRODUODENOSCOPY (EGD) WITH ESOPHAGEAL DILATION N/A 02/06/2014   Procedure: ESOPHAGOGASTRODUODENOSCOPY (EGD) WITH ESOPHAGEAL DILATION;  Surgeon: Daneil Dolin, MD;  [facility];  Service: Endoscopy;  Laterality: N/A;  9:30   OOPHORECTOMY  1976   ORIF ANKLE FRACTURE  01/18/2012   Procedure: OPEN REDUCTION INTERNAL FIXATION (ORIF) ANKLE FRACTURE;  Surgeon: Arther Abbott, MD;  Location: [facility];  Service: Orthopedics;  Laterality: Left;   ORIF ANKLE FRACTURE Right 01/13/2016   Procedure: OPEN REDUCTION INTERNAL FIXATION (ORIF) ANKLE FRACTURE;  Surgeon: Carole Civil, MD;  Location: [facility];  Service: Orthopedics;  Laterality: Right;    Social History   Socioeconomic History   Marital status: Widowed    Spouse name: Not on file  Number of children: 4   Years of education: college 1   Highest education level: Not on file  Occupational History   Occupation: employed in the tobacco industry     Employer: RETIRED   Occupation: retired  Tobacco Use   Smoking status: Former    Packs/day: 1.50    Years: 40.00    Pack years: 60.00    Types: Cigarettes    Quit date: 06/21/2006    Years since quitting: 15.5   Smokeless tobacco: Never  Vaping Use   Vaping Use: Never used  Substance and Sexual Activity   Alcohol use: No   Drug use: No   Sexual activity: Not Currently  Other Topics Concern   Not on file  Social History Narrative   Long term resident of [facility]    Social Determinants of Health   Financial Resource Strain:  Not on file  Food Insecurity: Not on file  Transportation Needs: Not on file  Physical Activity: Not on file  Stress: Not on file  Social Connections: Not on file  Intimate Partner Violence: Not on file   Family History  Problem Relation Age of Onset   Stomach cancer Father    Cancer Father    Cancer Mother        brain    Breast cancer Mother    Arthritis Other       VITAL SIGNS BP 138/81    Pulse 62    Temp 97.8 F (36.6 C)    Resp 18    Ht '6\' 1"'$  (1.854 m)    Wt 153 lb 12.8 oz (69.8 kg)    SpO2 98%    BMI 20.29 kg/m   Outpatient Encounter Medications as of 01/21/2022  Medication Sig Note   acetaminophen (TYLENOL) 325 MG tablet Take 650 mg by mouth every 8 (eight) hours as needed.    albuterol (VENTOLIN HFA) 108 (90 Base) MCG/ACT inhaler Inhale 2 puffs into the lungs every 6 (six) hours as needed for wheezing or shortness of breath.    alendronate (FOSAMAX) 70 MG tablet Take 70 mg by mouth once a week. Take on an empty stomach at least 30 mns prior to food. Sit upright x 41ms after taking meds. 01/06/2022: Sundays    atorvastatin (LIPITOR) 10 MG tablet TAKE 1 TABLET EVERY DAY    CALCIUM CARBONATE-VITAMIN D PO Take by mouth. 600 mg-10 mcg, oral, Once A Day    cefdinir (OMNICEF) 300 MG capsule Take 1 capsule (300 mg total) by mouth every 12 (twelve) hours. X 4 days    Cholecalciferol (VITAMIN D3 PO) Take 1,000 Units by mouth daily.    donepezil (ARICEPT) 10 MG tablet TAKE 1 TABLET AT BEDTIME    ergocalciferol (VITAMIN D2) 1.25 MG (50000 UT) capsule Take 50,000 Units by mouth once a week.    guaiFENesin (MUCINEX) 600 MG 12 hr tablet Take by mouth 2 (two) times daily.    loperamide (IMODIUM A-D) 2 MG tablet Take 2 mg by mouth as needed for diarrhea or loose stools.    loratadine (CLARITIN) 10 MG tablet Take 10 mg by mouth daily.    mirabegron ER (MYRBETRIQ) 50 MG TB24 tablet Take 50 mg by mouth daily.    NON FORMULARY Diet-regular    omeprazole (PRILOSEC) 40 MG capsule TAKE 1  CAPSULE EVERY DAY    OXYGEN Inhale 2 L into the lungs continuous.    potassium chloride (KLOR-CON) 20 MEQ packet Take 20 mEq by mouth daily.  predniSONE (DELTASONE) 20 MG tablet Take 3 tablets (60 mg total) by mouth daily with breakfast. X 5 days    STRIVERDI RESPIMAT 2.5 MCG/ACT AERS INHALE 2 PUFFS ONE TIME DAILY AT THE SAME TIME    verapamil (CALAN-SR) 240 MG CR tablet TAKE 1 TABLET AT BEDTIME    No facility-administered encounter medications on file as of 01/21/2022.     SIGNIFICANT DIAGNOSTIC EXAMS  LABS REVIEWED PREVIOUS  03-30-21: wbc 5.5; hgb 8.9; hct 30.2; mcv 92.9 plt 210; glucose 78; bun 23; creat 0.86; k+ 3.5; na++ 143; ca 8.6 GFR>60; liver normal albumin 2.9; chol 141; ldl 65; trig 67; hdl 63 06-02-21: urine culture: no growth; hep C neg 07-21-21: wbc 4.8; hgb 10.0; hct 34.4; mcv 87.3 plt 224; glucose 138; bun 22; creat 0.87; k+ 3.4; na++ 130; ca 8.5; GFR>60 07-27-21: albumin 3.7; k+ 3.9 09-02-21: glucose 84; bun 18; creat 0.77; k+ 3.7; na++ 139; ca 9.0; GFR>60; liver normal albumin 4.0 tsh 1.075 free t4: 0.93  12-03-21: wbc 3.6; hgb 10.3; hct 33.9; mcv 88.3 plt 220; glucose 87; bun 19; creat 0.92; k+ 4.1; na++ 140; ca 8.9; >60; total protein 6.1 albumin 3.6 01-04-22: wbc 5.8; hgb 10.0; hct 32.9; mcv 89.2 plt 188; glucose 78; bun 0.88; creat 0.88; k+ 3.8; na++ 136; ca 9.2; GFR >60 d-dimer 0.54; CRP 1.6  01-04-22 (ED) wbc10.6; hgb 12.0; hct 40.6; mcv89.8 plt 229; glucose 160; bun 26; creat 1.24; k+ 4.0; na++ 136; ca 9.0; GFR 44; protein 7.4 albumin 4.0 01-08-31: wbc 14.6; hgb 9.5; hct 31.9; mcv 86.4 plt 219; glucose 118; bun 17; creat 0.62; k+ 3.5; na++ 139; ca 8.2; GFR>60 protein 6.6; albumin 3.1; mag 2.0; CRP 12.2; d-dimer 0.77   NO NEW LABS.   Review of Systems  Constitutional:  Negative for malaise/fatigue.  Respiratory:  Negative for cough and shortness of breath.   Cardiovascular:  Negative for chest pain, palpitations and leg swelling.  Gastrointestinal:  Negative for  abdominal pain, constipation and heartburn.  Musculoskeletal:  Negative for back pain, joint pain and myalgias.  Skin: Negative.   Neurological:  Negative for dizziness.  Psychiatric/Behavioral:  The patient is not nervous/anxious.     Physical Exam Constitutional:      General: She is not in acute distress.    Appearance: She is well-developed. She is not diaphoretic.  Neck:     Thyroid: No thyromegaly.  Cardiovascular:     Rate and Rhythm: Normal rate and regular rhythm.     Pulses: Normal pulses.     Heart sounds: Murmur heard.  Pulmonary:     Effort: Pulmonary effort is normal. No respiratory distress.     Breath sounds: Normal breath sounds.     Comments: 02 Abdominal:     General: Bowel sounds are normal. There is no distension.     Palpations: Abdomen is soft.     Tenderness: There is no abdominal tenderness.  Musculoskeletal:        General: Normal range of motion.     Cervical back: Neck supple.     Right lower leg: No edema.     Left lower leg: No edema.  Lymphadenopathy:     Cervical: No cervical adenopathy.  Skin:    General: Skin is warm and dry.  Neurological:     Mental Status: She is alert. Mental status is at baseline.  Psychiatric:        Mood and Affect: Mood normal.     ASSESSMENT/ PLAN:  TODAY  Vascular dementia without behavioral disturbance Weight loss  Will begin remeron 7.5 mg nightly for 30 days will monitor    Ok Edwards NP [facility]   call [phone]

## 2022-01-24 DIAGNOSIS — J449 Chronic obstructive pulmonary disease, unspecified: Secondary | ICD-10-CM | POA: Diagnosis not present

## 2022-01-24 DIAGNOSIS — U071 COVID-19: Secondary | ICD-10-CM | POA: Diagnosis not present

## 2022-01-24 DIAGNOSIS — J9621 Acute and chronic respiratory failure with hypoxia: Secondary | ICD-10-CM | POA: Diagnosis not present

## 2022-01-24 DIAGNOSIS — R6521 Severe sepsis with septic shock: Secondary | ICD-10-CM | POA: Diagnosis not present

## 2022-01-24 DIAGNOSIS — R293 Abnormal posture: Secondary | ICD-10-CM | POA: Diagnosis not present

## 2022-01-24 DIAGNOSIS — M6281 Muscle weakness (generalized): Secondary | ICD-10-CM | POA: Diagnosis not present

## 2022-01-24 DIAGNOSIS — J1281 Pneumonia due to SARS-associated coronavirus: Secondary | ICD-10-CM | POA: Diagnosis not present

## 2022-01-24 DIAGNOSIS — R2681 Unsteadiness on feet: Secondary | ICD-10-CM | POA: Diagnosis not present

## 2022-01-24 DIAGNOSIS — R1312 Dysphagia, oropharyngeal phase: Secondary | ICD-10-CM | POA: Diagnosis not present

## 2022-01-26 ENCOUNTER — Other Ambulatory Visit: Payer: Self-pay

## 2022-01-26 ENCOUNTER — Ambulatory Visit (HOSPITAL_COMMUNITY)
Admission: RE | Admit: 2022-01-26 | Discharge: 2022-01-26 | Disposition: A | Discharge status: Home / Self Care | Payer: Medicare PPO | Source: Ambulatory Visit | Attending: Internal Medicine | Admitting: Internal Medicine

## 2022-01-26 ENCOUNTER — Encounter: Payer: Self-pay | Admitting: Internal Medicine

## 2022-01-26 DIAGNOSIS — K224 Dyskinesia of esophagus: Secondary | ICD-10-CM | POA: Diagnosis not present

## 2022-01-26 DIAGNOSIS — R1312 Dysphagia, oropharyngeal phase: Secondary | ICD-10-CM | POA: Diagnosis not present

## 2022-01-28 DIAGNOSIS — J1281 Pneumonia due to SARS-associated coronavirus: Secondary | ICD-10-CM | POA: Diagnosis not present

## 2022-01-28 DIAGNOSIS — R2681 Unsteadiness on feet: Secondary | ICD-10-CM | POA: Diagnosis not present

## 2022-01-28 DIAGNOSIS — J9621 Acute and chronic respiratory failure with hypoxia: Secondary | ICD-10-CM | POA: Diagnosis not present

## 2022-01-28 DIAGNOSIS — R6521 Severe sepsis with septic shock: Secondary | ICD-10-CM | POA: Diagnosis not present

## 2022-01-28 DIAGNOSIS — R1312 Dysphagia, oropharyngeal phase: Secondary | ICD-10-CM | POA: Diagnosis not present

## 2022-01-28 DIAGNOSIS — R293 Abnormal posture: Secondary | ICD-10-CM | POA: Diagnosis not present

## 2022-01-28 DIAGNOSIS — U071 COVID-19: Secondary | ICD-10-CM | POA: Diagnosis not present

## 2022-01-28 DIAGNOSIS — J449 Chronic obstructive pulmonary disease, unspecified: Secondary | ICD-10-CM | POA: Diagnosis not present

## 2022-01-28 DIAGNOSIS — M6281 Muscle weakness (generalized): Secondary | ICD-10-CM | POA: Diagnosis not present

## 2022-01-29 DIAGNOSIS — R2681 Unsteadiness on feet: Secondary | ICD-10-CM | POA: Diagnosis not present

## 2022-01-29 DIAGNOSIS — R1312 Dysphagia, oropharyngeal phase: Secondary | ICD-10-CM | POA: Diagnosis not present

## 2022-01-29 DIAGNOSIS — J1281 Pneumonia due to SARS-associated coronavirus: Secondary | ICD-10-CM | POA: Diagnosis not present

## 2022-01-29 DIAGNOSIS — U071 COVID-19: Secondary | ICD-10-CM | POA: Diagnosis not present

## 2022-01-29 DIAGNOSIS — J449 Chronic obstructive pulmonary disease, unspecified: Secondary | ICD-10-CM | POA: Diagnosis not present

## 2022-01-29 DIAGNOSIS — R293 Abnormal posture: Secondary | ICD-10-CM | POA: Diagnosis not present

## 2022-01-29 DIAGNOSIS — R6521 Severe sepsis with septic shock: Secondary | ICD-10-CM | POA: Diagnosis not present

## 2022-01-29 DIAGNOSIS — M6281 Muscle weakness (generalized): Secondary | ICD-10-CM | POA: Diagnosis not present

## 2022-01-29 DIAGNOSIS — J9621 Acute and chronic respiratory failure with hypoxia: Secondary | ICD-10-CM | POA: Diagnosis not present

## 2022-01-30 DIAGNOSIS — R6521 Severe sepsis with septic shock: Secondary | ICD-10-CM | POA: Diagnosis not present

## 2022-01-30 DIAGNOSIS — J449 Chronic obstructive pulmonary disease, unspecified: Secondary | ICD-10-CM | POA: Diagnosis not present

## 2022-01-30 DIAGNOSIS — R293 Abnormal posture: Secondary | ICD-10-CM | POA: Diagnosis not present

## 2022-01-30 DIAGNOSIS — M6281 Muscle weakness (generalized): Secondary | ICD-10-CM | POA: Diagnosis not present

## 2022-01-30 DIAGNOSIS — R2681 Unsteadiness on feet: Secondary | ICD-10-CM | POA: Diagnosis not present

## 2022-01-30 DIAGNOSIS — J1281 Pneumonia due to SARS-associated coronavirus: Secondary | ICD-10-CM | POA: Diagnosis not present

## 2022-01-30 DIAGNOSIS — J9621 Acute and chronic respiratory failure with hypoxia: Secondary | ICD-10-CM | POA: Diagnosis not present

## 2022-01-30 DIAGNOSIS — U071 COVID-19: Secondary | ICD-10-CM | POA: Diagnosis not present

## 2022-01-30 DIAGNOSIS — R1312 Dysphagia, oropharyngeal phase: Secondary | ICD-10-CM | POA: Diagnosis not present

## 2022-01-31 DIAGNOSIS — R1312 Dysphagia, oropharyngeal phase: Secondary | ICD-10-CM | POA: Diagnosis not present

## 2022-01-31 DIAGNOSIS — R6521 Severe sepsis with septic shock: Secondary | ICD-10-CM | POA: Diagnosis not present

## 2022-01-31 DIAGNOSIS — J449 Chronic obstructive pulmonary disease, unspecified: Secondary | ICD-10-CM | POA: Diagnosis not present

## 2022-01-31 DIAGNOSIS — R293 Abnormal posture: Secondary | ICD-10-CM | POA: Diagnosis not present

## 2022-01-31 DIAGNOSIS — J1281 Pneumonia due to SARS-associated coronavirus: Secondary | ICD-10-CM | POA: Diagnosis not present

## 2022-01-31 DIAGNOSIS — J9621 Acute and chronic respiratory failure with hypoxia: Secondary | ICD-10-CM | POA: Diagnosis not present

## 2022-01-31 DIAGNOSIS — U071 COVID-19: Secondary | ICD-10-CM | POA: Diagnosis not present

## 2022-01-31 DIAGNOSIS — M6281 Muscle weakness (generalized): Secondary | ICD-10-CM | POA: Diagnosis not present

## 2022-01-31 DIAGNOSIS — R2681 Unsteadiness on feet: Secondary | ICD-10-CM | POA: Diagnosis not present

## 2022-02-01 DIAGNOSIS — J449 Chronic obstructive pulmonary disease, unspecified: Secondary | ICD-10-CM | POA: Diagnosis not present

## 2022-02-01 DIAGNOSIS — U071 COVID-19: Secondary | ICD-10-CM | POA: Diagnosis not present

## 2022-02-01 DIAGNOSIS — R293 Abnormal posture: Secondary | ICD-10-CM | POA: Diagnosis not present

## 2022-02-01 DIAGNOSIS — R2681 Unsteadiness on feet: Secondary | ICD-10-CM | POA: Diagnosis not present

## 2022-02-01 DIAGNOSIS — J1281 Pneumonia due to SARS-associated coronavirus: Secondary | ICD-10-CM | POA: Diagnosis not present

## 2022-02-01 DIAGNOSIS — R1312 Dysphagia, oropharyngeal phase: Secondary | ICD-10-CM | POA: Diagnosis not present

## 2022-02-01 DIAGNOSIS — M6281 Muscle weakness (generalized): Secondary | ICD-10-CM | POA: Diagnosis not present

## 2022-02-01 DIAGNOSIS — J9621 Acute and chronic respiratory failure with hypoxia: Secondary | ICD-10-CM | POA: Diagnosis not present

## 2022-02-01 DIAGNOSIS — R6521 Severe sepsis with septic shock: Secondary | ICD-10-CM | POA: Diagnosis not present

## 2022-02-02 DIAGNOSIS — R2681 Unsteadiness on feet: Secondary | ICD-10-CM | POA: Diagnosis not present

## 2022-02-02 DIAGNOSIS — R293 Abnormal posture: Secondary | ICD-10-CM | POA: Diagnosis not present

## 2022-02-02 DIAGNOSIS — M6281 Muscle weakness (generalized): Secondary | ICD-10-CM | POA: Diagnosis not present

## 2022-02-02 DIAGNOSIS — J9621 Acute and chronic respiratory failure with hypoxia: Secondary | ICD-10-CM | POA: Diagnosis not present

## 2022-02-02 DIAGNOSIS — U071 COVID-19: Secondary | ICD-10-CM | POA: Diagnosis not present

## 2022-02-02 DIAGNOSIS — J449 Chronic obstructive pulmonary disease, unspecified: Secondary | ICD-10-CM | POA: Diagnosis not present

## 2022-02-02 DIAGNOSIS — J1281 Pneumonia due to SARS-associated coronavirus: Secondary | ICD-10-CM | POA: Diagnosis not present

## 2022-02-02 DIAGNOSIS — R6521 Severe sepsis with septic shock: Secondary | ICD-10-CM | POA: Diagnosis not present

## 2022-02-02 DIAGNOSIS — R1312 Dysphagia, oropharyngeal phase: Secondary | ICD-10-CM | POA: Diagnosis not present

## 2022-02-03 DIAGNOSIS — R1312 Dysphagia, oropharyngeal phase: Secondary | ICD-10-CM | POA: Diagnosis not present

## 2022-02-03 DIAGNOSIS — R2681 Unsteadiness on feet: Secondary | ICD-10-CM | POA: Diagnosis not present

## 2022-02-03 DIAGNOSIS — J1281 Pneumonia due to SARS-associated coronavirus: Secondary | ICD-10-CM | POA: Diagnosis not present

## 2022-02-03 DIAGNOSIS — J449 Chronic obstructive pulmonary disease, unspecified: Secondary | ICD-10-CM | POA: Diagnosis not present

## 2022-02-03 DIAGNOSIS — R6521 Severe sepsis with septic shock: Secondary | ICD-10-CM | POA: Diagnosis not present

## 2022-02-03 DIAGNOSIS — U071 COVID-19: Secondary | ICD-10-CM | POA: Diagnosis not present

## 2022-02-03 DIAGNOSIS — J9621 Acute and chronic respiratory failure with hypoxia: Secondary | ICD-10-CM | POA: Diagnosis not present

## 2022-02-03 DIAGNOSIS — R293 Abnormal posture: Secondary | ICD-10-CM | POA: Diagnosis not present

## 2022-02-03 DIAGNOSIS — M6281 Muscle weakness (generalized): Secondary | ICD-10-CM | POA: Diagnosis not present

## 2022-02-04 ENCOUNTER — Other Ambulatory Visit (HOSPITAL_COMMUNITY): Payer: Self-pay | Admitting: Adult Health

## 2022-02-04 ENCOUNTER — Other Ambulatory Visit: Payer: Self-pay

## 2022-02-04 ENCOUNTER — Encounter: Payer: Self-pay | Admitting: Adult Health

## 2022-02-04 ENCOUNTER — Other Ambulatory Visit: Payer: Self-pay | Admitting: Adult Health

## 2022-02-04 ENCOUNTER — Non-Acute Institutional Stay (SKILLED_NURSING_FACILITY): Payer: Medicare PPO | Admitting: Adult Health

## 2022-02-04 ENCOUNTER — Ambulatory Visit (HOSPITAL_COMMUNITY)
Admission: RE | Admit: 2022-02-04 | Discharge: 2022-02-04 | Disposition: A | Discharge status: Home / Self Care | Payer: Medicare PPO | Source: Ambulatory Visit | Attending: Adult Health | Admitting: Adult Health

## 2022-02-04 DIAGNOSIS — R293 Abnormal posture: Secondary | ICD-10-CM | POA: Diagnosis not present

## 2022-02-04 DIAGNOSIS — R2681 Unsteadiness on feet: Secondary | ICD-10-CM | POA: Diagnosis not present

## 2022-02-04 DIAGNOSIS — R29898 Other symptoms and signs involving the musculoskeletal system: Secondary | ICD-10-CM

## 2022-02-04 DIAGNOSIS — M6281 Muscle weakness (generalized): Secondary | ICD-10-CM | POA: Diagnosis not present

## 2022-02-04 DIAGNOSIS — R569 Unspecified convulsions: Secondary | ICD-10-CM

## 2022-02-04 DIAGNOSIS — J9621 Acute and chronic respiratory failure with hypoxia: Secondary | ICD-10-CM | POA: Diagnosis not present

## 2022-02-04 DIAGNOSIS — R4182 Altered mental status, unspecified: Secondary | ICD-10-CM

## 2022-02-04 DIAGNOSIS — R531 Weakness: Secondary | ICD-10-CM | POA: Diagnosis not present

## 2022-02-04 DIAGNOSIS — J1281 Pneumonia due to SARS-associated coronavirus: Secondary | ICD-10-CM | POA: Diagnosis not present

## 2022-02-04 DIAGNOSIS — R6521 Severe sepsis with septic shock: Secondary | ICD-10-CM | POA: Diagnosis not present

## 2022-02-04 DIAGNOSIS — J449 Chronic obstructive pulmonary disease, unspecified: Secondary | ICD-10-CM | POA: Diagnosis not present

## 2022-02-04 DIAGNOSIS — R1312 Dysphagia, oropharyngeal phase: Secondary | ICD-10-CM | POA: Diagnosis not present

## 2022-02-04 DIAGNOSIS — U071 COVID-19: Secondary | ICD-10-CM | POA: Diagnosis not present

## 2022-02-04 NOTE — Progress Notes (Signed)
?Location:  [place] ?Nursing Home Room Number: 149-W ?Place of Service:  SNF (31) ? ? ?CODE STATUS: DNR ? ?Allergies  ?Allergen Reactions  ? Fenofibrate Nausea And Vomiting  ? Pravastatin Sodium Nausea And Vomiting  ? Sulfonamide Derivatives Other (See Comments)  ?  unknown  ? Ciprofloxacin Rash  ? ? ?Chief Complaint  ?Patient presents with  ? Acute Visit  ?  Questionable seizure activity.  ? ? ?HPI: ? ?She was found staring off "into space"; drooling. The episode lasted for more than one minute and self resolved. She did not recognize family members at first. She is incontinent of urine. She is having periodic time os left leg weakness, dragging her leg behind wheelchair. She is currently at her baseline. Propelling self in wheelchair easily. She is on long term 02.  ? ?Past Medical History:  ?Diagnosis Date  ? Abnormality of gait 06/06/2014  ? Allergic rhinitis   ? Anemia   ? Ankle fracture, right   ? Anxiety   ? Arm fracture, left   ? Arthritis   ? abnormal gait   ? COPD (chronic obstructive pulmonary disease) ([place])   ? chronic CO2 retention, decreased DLCO   ? Cystic acne   ? adult   ? Degenerative disc disease, cervical   ? 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 Degenerative disc disease, lumbar   ? L5 nerve impingement   ? Dementia ([place])   ? Depression   ? DNR (do not resuscitate)   ? Fracture of right proximal fibula 07/13/18 09/05/2018  ? GERD (gastroesophageal reflux disease)   ? Heme positive stool 10/10/2017  ? History of colon surgery   ? right hemicolectomy for high-grade adenomas in right colon 2013  ? Hyperglycemia   ? Hypertension   ? Low back pain   ? Mediastinal lymphadenopathy 1/9  ? resolving   ? Memory difficulty 09/20/2014  ? mild dementia  ? Onychomycosis   ? OSA on CPAP   ? Sleep apnea   ? stop bang score 6  ? ? ?Past Surgical History:  ?Procedure Laterality Date  ? ABDOMINAL HYSTERECTOMY  1976  ? secondary to bleeding   ? APPENDECTOMY    ? COLON RESECTION  08/18/2012  ? Procedure: HAND ASSISTED  LAPAROSCOPIC COLON RESECTION;  Surgeon: Jamesetta So, MD;  Location: [facility];  Service: General;  Laterality: N/A;  ? COLONOSCOPY  07/20/2012  ? Dr. Gala Romney: multiple colonic polyps with large polyps on right side, s/p saline-assisted debulking piecemeal polypectomy and ablation. Not all removed. Path with tubulovillous adenomas, high grade dysplasia.   ? COLONOSCOPY N/A 03/09/2013  ? PZW:CHENIDP polyps-tubular adenomas. S/p right hemicolectomy. next tcs 02/2018  ? epidural steroids    ? ESOPHAGOGASTRODUODENOSCOPY (EGD) WITH ESOPHAGEAL DILATION N/A 02/06/2014  ? Procedure: ESOPHAGOGASTRODUODENOSCOPY (EGD) WITH ESOPHAGEAL DILATION;  Surgeon: Daneil Dolin, MD;  [facility];  Service: Endoscopy;  Laterality: N/A;  9:30  ? OOPHORECTOMY  1976  ? ORIF ANKLE FRACTURE  01/18/2012  ? Procedure: OPEN REDUCTION INTERNAL FIXATION (ORIF) ANKLE FRACTURE;  Surgeon: Arther Abbott, MD;  Location: [facility];  Service: Orthopedics;  Laterality: Left;  ? ORIF ANKLE FRACTURE Right 01/13/2016  ? Procedure: OPEN REDUCTION INTERNAL FIXATION (ORIF) ANKLE FRACTURE;  Surgeon: Carole Civil, MD;  Location: [facility];  Service: Orthopedics;  Laterality: Right;  ? ? ?Social History  ? ?Socioeconomic History  ? Marital status: Widowed  ?  Spouse name: Not on file  ? Number of children: 4  ?  Years of education: college 1  ? Highest education level: Not on file  ?Occupational History  ? Occupation: employed in the tobacco industry   ?  Employer: RETIRED  ? Occupation: retired  ?Tobacco Use  ? Smoking status: Former  ?  Packs/day: 1.50  ?  Years: 40.00  ?  Pack years: 60.00  ?  Types: Cigarettes  ?  Quit date: 06/21/2006  ?  Years since quitting: 15.6  ? Smokeless tobacco: Never  ?Vaping Use  ? Vaping Use: Never used  ?Substance and Sexual Activity  ? Alcohol use: No  ? Drug use: No  ? Sexual activity: Not Currently  ?Other Topics Concern  ? Not on file  ?Social History Narrative  ? Long term resident of [facility]   ? ?Social Determinants of Health   ? ?Financial Resource Strain: Not on file  ?Food Insecurity: Not on file  ?Transportation Needs: Not on file  ?Physical Activity: Not on file  ?Stress: Not on file  ?Social Connections: Not on file  ?Intimate Partner Violence: Not on file  ? ?Family History  ?Problem Relation Age of Onset  ? Stomach cancer Father   ? Cancer Father   ? Cancer Mother   ?     brain   ? Breast cancer Mother   ? Arthritis Other   ? ? ? ? ?VITAL SIGNS ?BP 113/66   Pulse 75   Temp (!) 97.3 ?F (36.3 ?C)   Resp 20   Ht '6\' 1"'$  (1.854 m)   Wt 154 lb 11.2 oz (70.2 kg)   SpO2 94%   BMI 20.41 kg/m?  ? ?Outpatient Encounter Medications as of 02/04/2022  ?Medication Sig Note  ? acetaminophen (TYLENOL) 325 MG tablet Take 650 mg by mouth every 8 (eight) hours as needed.   ? albuterol (VENTOLIN HFA) 108 (90 Base) MCG/ACT inhaler Inhale 2 puffs into the lungs every 6 (six) hours as needed for wheezing or shortness of breath.   ? alendronate (FOSAMAX) 70 MG tablet Take 70 mg by mouth once a week. Take on an empty stomach at least 30 mns prior to food. Sit upright x 52ms after taking meds. 01/06/2022: Sundays   ? atorvastatin (LIPITOR) 10 MG tablet TAKE 1 TABLET EVERY DAY   ? CALCIUM CARBONATE-VITAMIN D PO Take by mouth. 600 mg-10 mcg, oral, Once A Day   ? cefdinir (OMNICEF) 300 MG capsule Take 1 capsule (300 mg total) by mouth every 12 (twelve) hours. X 4 days   ? Cholecalciferol (VITAMIN D3 PO) Take 1,000 Units by mouth daily.   ? donepezil (ARICEPT) 10 MG tablet TAKE 1 TABLET AT BEDTIME   ? guaiFENesin (MUCINEX) 600 MG 12 hr tablet Take by mouth 2 (two) times daily.   ? loperamide (IMODIUM A-D) 2 MG tablet Take 2 mg by mouth as needed for diarrhea or loose stools.   ? loratadine (CLARITIN) 10 MG tablet Take 10 mg by mouth daily.   ? mirabegron ER (MYRBETRIQ) 50 MG TB24 tablet Take 50 mg by mouth daily.   ? NON FORMULARY Diet-regular   ? omeprazole (PRILOSEC) 40 MG capsule TAKE 1 CAPSULE EVERY DAY   ? OXYGEN Inhale 2 L into the lungs continuous.    ? potassium chloride (KLOR-CON) 20 MEQ packet Take 20 mEq by mouth daily.   ? STRIVERDI RESPIMAT 2.5 MCG/ACT AERS INHALE 2 PUFFS ONE TIME DAILY AT THE SAME TIME   ? verapamil (CALAN-SR) 240 MG CR tablet TAKE 1 TABLET AT BEDTIME   ? [  DISCONTINUED] azithromycin (ZITHROMAX) 500 MG tablet Take 1 tablet (500 mg total) by mouth daily. X 4 days   ? [DISCONTINUED] ergocalciferol (VITAMIN D2) 1.25 MG (50000 UT) capsule Take 50,000 Units by mouth once a week.   ? [DISCONTINUED] predniSONE (DELTASONE) 20 MG tablet Take 3 tablets (60 mg total) by mouth daily with breakfast. X 5 days   ? [DISCONTINUED] Zinc Sulfate 220 (50 Zn) MG TABS Take 1 tablet by mouth daily.   ? ?No facility-administered encounter medications on file as of 02/04/2022.  ? ? ? ?SIGNIFICANT DIAGNOSTIC EXAMS ? ? ?LABS REVIEWED PREVIOUS ? ?03-30-21: wbc 5.5; hgb 8.9; hct 30.2; mcv 92.9 plt 210; glucose 78; bun 23; creat 0.86; k+ 3.5; na++ 143; ca 8.6 GFR>60; liver normal albumin 2.9; chol 141; ldl 65; trig 67; hdl 63 ?06-02-21: urine culture: no growth; hep C neg ?07-21-21: wbc 4.8; hgb 10.0; hct 34.4; mcv 87.3 plt 224; glucose 138; bun 22; creat 0.87; k+ 3.4; na++ 130; ca 8.5; GFR>60 ?07-27-21: albumin 3.7; k+ 3.9 ?09-02-21: glucose 84; bun 18; creat 0.77; k+ 3.7; na++ 139; ca 9.0; GFR>60; liver normal albumin 4.0 tsh 1.075 free t4: 0.93  ?12-03-21: wbc 3.6; hgb 10.3; hct 33.9; mcv 88.3 plt 220; glucose 87; bun 19; creat 0.92; k+ 4.1; na++ 140; ca 8.9; >60; total protein 6.1 albumin 3.6 ?01-04-22: wbc 5.8; hgb 10.0; hct 32.9; mcv 89.2 plt 188; glucose 78; bun 0.88; creat 0.88; k+ 3.8; na++ 136; ca 9.2; GFR >60 d-dimer 0.54; CRP 1.6  ?01-04-22 (ED) wbc10.6; hgb 12.0; hct 40.6; mcv89.8 plt 229; glucose 160; bun 26; creat 1.24; k+ 4.0; na++ 136; ca 9.0; GFR 44; protein 7.4 albumin 4.0 ?01-08-31: wbc 14.6; hgb 9.5; hct 31.9; mcv 86.4 plt 219; glucose 118; bun 17; creat 0.62; k+ 3.5; na++ 139; ca 8.2; GFR>60 protein 6.6; albumin 3.1; mag 2.0; CRP 12.2; d-dimer 0.77  ? ?NO  NEW LABS.  ? ?Review of Systems  ?Constitutional:  Negative for malaise/fatigue.  ?Respiratory:  Negative for cough and shortness of breath.   ?Cardiovascular:  Negative for chest pain, palpitations and leg swelling.  ?

## 2022-02-05 DIAGNOSIS — J449 Chronic obstructive pulmonary disease, unspecified: Secondary | ICD-10-CM | POA: Diagnosis not present

## 2022-02-05 DIAGNOSIS — J9621 Acute and chronic respiratory failure with hypoxia: Secondary | ICD-10-CM | POA: Diagnosis not present

## 2022-02-05 DIAGNOSIS — R293 Abnormal posture: Secondary | ICD-10-CM | POA: Diagnosis not present

## 2022-02-05 DIAGNOSIS — U071 COVID-19: Secondary | ICD-10-CM | POA: Diagnosis not present

## 2022-02-05 DIAGNOSIS — R2681 Unsteadiness on feet: Secondary | ICD-10-CM | POA: Diagnosis not present

## 2022-02-05 DIAGNOSIS — R1312 Dysphagia, oropharyngeal phase: Secondary | ICD-10-CM | POA: Diagnosis not present

## 2022-02-05 DIAGNOSIS — R6521 Severe sepsis with septic shock: Secondary | ICD-10-CM | POA: Diagnosis not present

## 2022-02-05 DIAGNOSIS — J1281 Pneumonia due to SARS-associated coronavirus: Secondary | ICD-10-CM | POA: Diagnosis not present

## 2022-02-05 DIAGNOSIS — M6281 Muscle weakness (generalized): Secondary | ICD-10-CM | POA: Diagnosis not present

## 2022-02-08 DIAGNOSIS — J1281 Pneumonia due to SARS-associated coronavirus: Secondary | ICD-10-CM | POA: Diagnosis not present

## 2022-02-08 DIAGNOSIS — R4182 Altered mental status, unspecified: Secondary | ICD-10-CM | POA: Insufficient documentation

## 2022-02-08 DIAGNOSIS — R293 Abnormal posture: Secondary | ICD-10-CM | POA: Diagnosis not present

## 2022-02-08 DIAGNOSIS — R2681 Unsteadiness on feet: Secondary | ICD-10-CM | POA: Diagnosis not present

## 2022-02-08 DIAGNOSIS — U071 COVID-19: Secondary | ICD-10-CM | POA: Diagnosis not present

## 2022-02-08 DIAGNOSIS — J9621 Acute and chronic respiratory failure with hypoxia: Secondary | ICD-10-CM | POA: Diagnosis not present

## 2022-02-08 DIAGNOSIS — R1312 Dysphagia, oropharyngeal phase: Secondary | ICD-10-CM | POA: Diagnosis not present

## 2022-02-08 DIAGNOSIS — M6281 Muscle weakness (generalized): Secondary | ICD-10-CM | POA: Diagnosis not present

## 2022-02-08 DIAGNOSIS — R6521 Severe sepsis with septic shock: Secondary | ICD-10-CM | POA: Diagnosis not present

## 2022-02-08 DIAGNOSIS — J449 Chronic obstructive pulmonary disease, unspecified: Secondary | ICD-10-CM | POA: Diagnosis not present

## 2022-02-09 ENCOUNTER — Ambulatory Visit: Payer: Medicare PPO | Admitting: Internal Medicine

## 2022-02-09 DIAGNOSIS — J449 Chronic obstructive pulmonary disease, unspecified: Secondary | ICD-10-CM | POA: Diagnosis not present

## 2022-02-09 DIAGNOSIS — R6521 Severe sepsis with septic shock: Secondary | ICD-10-CM | POA: Diagnosis not present

## 2022-02-09 DIAGNOSIS — U071 COVID-19: Secondary | ICD-10-CM | POA: Diagnosis not present

## 2022-02-09 DIAGNOSIS — R2681 Unsteadiness on feet: Secondary | ICD-10-CM | POA: Diagnosis not present

## 2022-02-09 DIAGNOSIS — J9621 Acute and chronic respiratory failure with hypoxia: Secondary | ICD-10-CM | POA: Diagnosis not present

## 2022-02-09 DIAGNOSIS — J1281 Pneumonia due to SARS-associated coronavirus: Secondary | ICD-10-CM | POA: Diagnosis not present

## 2022-02-09 DIAGNOSIS — R293 Abnormal posture: Secondary | ICD-10-CM | POA: Diagnosis not present

## 2022-02-09 DIAGNOSIS — M6281 Muscle weakness (generalized): Secondary | ICD-10-CM | POA: Diagnosis not present

## 2022-02-09 DIAGNOSIS — R1312 Dysphagia, oropharyngeal phase: Secondary | ICD-10-CM | POA: Diagnosis not present

## 2022-02-10 ENCOUNTER — Other Ambulatory Visit: Payer: Self-pay

## 2022-02-10 ENCOUNTER — Ambulatory Visit (HOSPITAL_COMMUNITY)
Admission: RE | Admit: 2022-02-10 | Discharge: 2022-02-10 | Disposition: A | Discharge status: Home / Self Care | Payer: Medicare PPO | Source: Ambulatory Visit | Attending: Adult Health | Admitting: Adult Health

## 2022-02-10 DIAGNOSIS — R569 Unspecified convulsions: Secondary | ICD-10-CM | POA: Diagnosis not present

## 2022-02-10 DIAGNOSIS — J1281 Pneumonia due to SARS-associated coronavirus: Secondary | ICD-10-CM | POA: Diagnosis not present

## 2022-02-10 DIAGNOSIS — R1312 Dysphagia, oropharyngeal phase: Secondary | ICD-10-CM | POA: Diagnosis not present

## 2022-02-10 DIAGNOSIS — U071 COVID-19: Secondary | ICD-10-CM | POA: Diagnosis not present

## 2022-02-10 DIAGNOSIS — R293 Abnormal posture: Secondary | ICD-10-CM | POA: Diagnosis not present

## 2022-02-10 DIAGNOSIS — M6281 Muscle weakness (generalized): Secondary | ICD-10-CM | POA: Diagnosis not present

## 2022-02-10 DIAGNOSIS — J449 Chronic obstructive pulmonary disease, unspecified: Secondary | ICD-10-CM | POA: Diagnosis not present

## 2022-02-10 DIAGNOSIS — J9621 Acute and chronic respiratory failure with hypoxia: Secondary | ICD-10-CM | POA: Diagnosis not present

## 2022-02-10 DIAGNOSIS — R4182 Altered mental status, unspecified: Secondary | ICD-10-CM | POA: Diagnosis not present

## 2022-02-10 DIAGNOSIS — R6521 Severe sepsis with septic shock: Secondary | ICD-10-CM | POA: Diagnosis not present

## 2022-02-10 DIAGNOSIS — R2681 Unsteadiness on feet: Secondary | ICD-10-CM | POA: Diagnosis not present

## 2022-02-10 NOTE — Progress Notes (Signed)
EEG complete - results pending 

## 2022-02-10 NOTE — Procedures (Signed)
Patient Name: [name]  ?MRN: [id number]  ?Epilepsy Attending: Lora Havens  ?Referring Physician/Provider: Gerlene Fee, NP ?Date: 02/10/2022 ?Duration: 25.29 mins ? ?Patient history: 81 year old female with altered mental status.  EEG ordered for seizure. ? ?Level of alertness: Awake, asleep ? ?AEDs during EEG study: None ? ?Technical aspects: This EEG study was done with scalp electrodes positioned according to the 10-20 International system of electrode placement. Electrical activity was acquired at a sampling rate of '500Hz'$  and reviewed with a high frequency filter of '70Hz'$  and a low frequency filter of '1Hz'$ . EEG data were recorded continuously and digitally stored.  ? ?Description: The posterior dominant rhythm consists of 8 Hz activity of moderate voltage (25-35 uV) seen predominantly in posterior head regions, symmetric and reactive to eye opening and eye closing.  Sleep was characterized by vertex waves, sleep spindles (12 to 14 Hz), maximal frontocentral region. EEG also showed intermittent generalized 5 to 6 Hz theta slowing.  Physiologic photic driving was not seen during photic stimulation.  Hyperventilation was not performed.    ? ?ABNORMALITY ?- Intermittent slow, generalized ? ?IMPRESSION: ?This study is suggestive of mild diffuse encephalopathy, nonspecific etiology. No seizures or epileptiform discharges were seen throughout the recording. ? ?Lora Havens  ? ?

## 2022-02-11 ENCOUNTER — Non-Acute Institutional Stay (SKILLED_NURSING_FACILITY): Payer: Medicare PPO | Admitting: Adult Health

## 2022-02-11 ENCOUNTER — Encounter: Payer: Self-pay | Admitting: Adult Health

## 2022-02-11 DIAGNOSIS — F0150 Vascular dementia without behavioral disturbance: Secondary | ICD-10-CM

## 2022-02-11 DIAGNOSIS — J9612 Chronic respiratory failure with hypercapnia: Secondary | ICD-10-CM | POA: Diagnosis not present

## 2022-02-11 DIAGNOSIS — J9611 Chronic respiratory failure with hypoxia: Secondary | ICD-10-CM

## 2022-02-11 DIAGNOSIS — R6521 Severe sepsis with septic shock: Secondary | ICD-10-CM | POA: Diagnosis not present

## 2022-02-11 DIAGNOSIS — R0902 Hypoxemia: Secondary | ICD-10-CM

## 2022-02-11 DIAGNOSIS — J449 Chronic obstructive pulmonary disease, unspecified: Secondary | ICD-10-CM | POA: Diagnosis not present

## 2022-02-11 DIAGNOSIS — R2681 Unsteadiness on feet: Secondary | ICD-10-CM | POA: Diagnosis not present

## 2022-02-11 DIAGNOSIS — J1281 Pneumonia due to SARS-associated coronavirus: Secondary | ICD-10-CM | POA: Diagnosis not present

## 2022-02-11 DIAGNOSIS — J9621 Acute and chronic respiratory failure with hypoxia: Secondary | ICD-10-CM | POA: Diagnosis not present

## 2022-02-11 DIAGNOSIS — R1312 Dysphagia, oropharyngeal phase: Secondary | ICD-10-CM | POA: Diagnosis not present

## 2022-02-11 DIAGNOSIS — K219 Gastro-esophageal reflux disease without esophagitis: Secondary | ICD-10-CM

## 2022-02-11 DIAGNOSIS — I10 Essential (primary) hypertension: Secondary | ICD-10-CM

## 2022-02-11 DIAGNOSIS — M6281 Muscle weakness (generalized): Secondary | ICD-10-CM | POA: Diagnosis not present

## 2022-02-11 DIAGNOSIS — R293 Abnormal posture: Secondary | ICD-10-CM | POA: Diagnosis not present

## 2022-02-11 DIAGNOSIS — U071 COVID-19: Secondary | ICD-10-CM | POA: Diagnosis not present

## 2022-02-11 NOTE — Progress Notes (Signed)
?Location:  [facility] ?[facility] Home Room Number: 149-W ?Place of Service:  SNF (31) ? ? ?CODE STATUS: DNR ? ?Allergies  ?Allergen Reactions  ? Fenofibrate Nausea And Vomiting  ? Pravastatin Sodium Nausea And Vomiting  ? Sulfonamide Derivatives Other (See Comments)  ?  unknown  ? Ciprofloxacin Rash  ? ? ?Chief Complaint  ?Patient presents with  ? Medical Management of Chronic Issues ? ?         Chronic respiratory failure with hypoxia and hypercapnia: COPD;  Essential hypertension:  Esophageal reflux disease without esophagitis: Vascular dementia without behavioral disturbance:   ? ? ?HPI: ? ?She is a 81 year old long term resident of this facility being seen for the management of her chronic illnesses:  Chronic respiratory failure with hypoxia and hypercapnia: COPD;  Essential hypertension:  Esophageal reflux disease without esophagitis: Vascular dementia without behavioral disturbance:. There are no reports of uncontrolled pain, she has not had any further episodes of having a vacant stare. She continues to propel herself around the facility.  ? ?Past Medical History:  ?Diagnosis Date  ? Abnormality of gait 06/06/2014  ? Allergic rhinitis   ? Anemia   ? Ankle fracture, right   ? Anxiety   ? Arm fracture, left   ? Arthritis   ? abnormal gait   ? COPD (chronic obstructive pulmonary disease) ([place])   ? chronic CO2 retention, decreased DLCO   ? Cystic acne   ? adult   ? Degenerative disc disease, cervical   ? 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 Degenerative disc disease, lumbar   ? L5 nerve impingement   ? Dementia ([place])   ? Depression   ? DNR (do not resuscitate)   ? Fracture of right proximal fibula 07/13/18 09/05/2018  ? GERD (gastroesophageal reflux disease)   ? Heme positive stool 10/10/2017  ? History of colon surgery   ? right hemicolectomy for high-grade adenomas in right colon 2013  ? Hyperglycemia   ? Hypertension   ? Low back pain   ? Mediastinal lymphadenopathy 1/9  ? resolving   ? Memory difficulty 09/20/2014  ? mild  dementia  ? Onychomycosis   ? OSA on CPAP   ? Sleep apnea   ? stop bang score 6  ? ? ?Past Surgical History:  ?Procedure Laterality Date  ? ABDOMINAL HYSTERECTOMY  1976  ? secondary to bleeding   ? APPENDECTOMY    ? COLON RESECTION  08/18/2012  ? Procedure: HAND ASSISTED LAPAROSCOPIC COLON RESECTION;  Surgeon: Jamesetta So, MD;  Location: [facility];  Service: General;  Laterality: N/A;  ? COLONOSCOPY  07/20/2012  ? Dr. Gala Romney: multiple colonic polyps with large polyps on right side, s/p saline-assisted debulking piecemeal polypectomy and ablation. Not all removed. Path with tubulovillous adenomas, high grade dysplasia.   ? COLONOSCOPY N/A 03/09/2013  ? IEP:PIRJJOA polyps-tubular adenomas. S/p right hemicolectomy. next tcs 02/2018  ? epidural steroids    ? ESOPHAGOGASTRODUODENOSCOPY (EGD) WITH ESOPHAGEAL DILATION N/A 02/06/2014  ? Procedure: ESOPHAGOGASTRODUODENOSCOPY (EGD) WITH ESOPHAGEAL DILATION;  Surgeon: Daneil Dolin, MD;  Location: [facility];  Service: Endoscopy;  Laterality: N/A;  9:30  ? OOPHORECTOMY  1976  ? ORIF ANKLE FRACTURE  01/18/2012  ? Procedure: OPEN REDUCTION INTERNAL FIXATION (ORIF) ANKLE FRACTURE;  Surgeon: Arther Abbott, MD;  Location: [facility];  Service: Orthopedics;  Laterality: Left;  ? ORIF ANKLE FRACTURE Right 01/13/2016  ? Procedure: OPEN REDUCTION INTERNAL FIXATION (ORIF) ANKLE FRACTURE;  Surgeon: Carole Civil, MD;  Location:  [facility];  Service: Orthopedics;  Laterality: Right;  ? ? ?Social History  ? ?Socioeconomic History  ? Marital status: Widowed  ?  Spouse name: Not on file  ? Number of children: 4  ? Years of education: college 1  ? Highest education level: Not on file  ?Occupational History  ? Occupation: employed in the tobacco industry   ?  Employer: RETIRED  ? Occupation: retired  ?Tobacco Use  ? Smoking status: Former  ?  Packs/day: 1.50  ?  Years: 40.00  ?  Pack years: 60.00  ?  Types: Cigarettes  ?  Quit date: 06/21/2006  ?  Years since quitting: 15.6  ? Smokeless tobacco: Never   ?Vaping Use  ? Vaping Use: Never used  ?Substance and Sexual Activity  ? Alcohol use: No  ? Drug use: No  ? Sexual activity: Not Currently  ?Other Topics Concern  ? Not on file  ?Social History Narrative  ? Long term resident of [facility]   ? ?Social Determinants of Health  ? ?Financial Resource Strain: Not on file  ?Food Insecurity: Not on file  ?Transportation Needs: Not on file  ?Physical Activity: Not on file  ?Stress: Not on file  ?Social Connections: Not on file  ?Intimate Partner Violence: Not on file  ? ?Family History  ?Problem Relation Age of Onset  ? Stomach cancer Father   ? Cancer Father   ? Cancer Mother   ?     brain   ? Breast cancer Mother   ? Arthritis Other   ? ? ? ? ?VITAL SIGNS ?BP (!) 123/52   Pulse 95   Temp 98.4 ?F (36.9 ?C)   Resp 20   Ht '6\' 1"'$  (1.854 m)   Wt 154 lb 11.2 oz (70.2 kg)   SpO2 96%   BMI 20.41 kg/m?  ? ?Outpatient Encounter Medications as of 02/11/2022  ?Medication Sig Note  ? acetaminophen (TYLENOL) 325 MG tablet Take 650 mg by mouth every 8 (eight) hours as needed.   ? albuterol (VENTOLIN HFA) 108 (90 Base) MCG/ACT inhaler Inhale 2 puffs into the lungs every 6 (six) hours as needed for wheezing or shortness of breath.   ? alendronate (FOSAMAX) 70 MG tablet Take 70 mg by mouth once a week. Take on an empty stomach at least 30 mns prior to food. Sit upright x 31ms after taking meds. 01/06/2022: Sundays   ? atorvastatin (LIPITOR) 10 MG tablet TAKE 1 TABLET EVERY DAY   ? CALCIUM CARBONATE-VITAMIN D PO Take by mouth. 600 mg-10 mcg, oral, Once A Day   ? Cholecalciferol (VITAMIN D3 PO) Take 1,000 Units by mouth daily.   ? donepezil (ARICEPT) 10 MG tablet TAKE 1 TABLET AT BEDTIME   ? loperamide (IMODIUM A-D) 2 MG tablet Take 2 mg by mouth as needed for diarrhea or loose stools.   ? loratadine (CLARITIN) 10 MG tablet Take 10 mg by mouth daily.   ? mirabegron ER (MYRBETRIQ) 50 MG TB24 tablet Take 50 mg by mouth daily.   ? mirtazapine (REMERON) 7.5 MG tablet Take 7.5 mg by mouth at  bedtime.   ? NON FORMULARY Diet: Regular diet with chopped meats and gravy to all meats.   ? Nutritional Supplements (ENSURE CLEAR) LIQD Twice a day between meals due to weight loss   ? omeprazole (PRILOSEC) 40 MG capsule TAKE 1 CAPSULE EVERY DAY   ? OXYGEN Inhale 2 L into the lungs continuous.   ? potassium chloride (KLOR-CON) 20 MEQ  packet Take 20 mEq by mouth daily.   ? STRIVERDI RESPIMAT 2.5 MCG/ACT AERS INHALE 2 PUFFS ONE TIME DAILY AT THE SAME TIME   ? verapamil (CALAN-SR) 240 MG CR tablet TAKE 1 TABLET AT BEDTIME   ? guaiFENesin (MUCINEX) 600 MG 12 hr tablet Take by mouth 2 (two) times daily.   ? [DISCONTINUED] cefdinir (OMNICEF) 300 MG capsule Take 1 capsule (300 mg total) by mouth every 12 (twelve) hours. X 4 days   ? ?No facility-administered encounter medications on file as of 02/11/2022.  ? ? ? ?SIGNIFICANT DIAGNOSTIC EXAMS ? ?LABS REVIEWED PREVIOUS ? ?03-30-21: wbc 5.5; hgb 8.9; hct 30.2; mcv 92.9 plt 210; glucose 78; bun 23; creat 0.86; k+ 3.5; na++ 143; ca 8.6 GFR>60; liver normal albumin 2.9; chol 141; ldl 65; trig 67; hdl 63 ?06-02-21: urine culture: no growth; hep C neg ?07-21-21: wbc 4.8; hgb 10.0; hct 34.4; mcv 87.3 plt 224; glucose 138; bun 22; creat 0.87; k+ 3.4; na++ 130; ca 8.5; GFR>60 ?07-27-21: albumin 3.7; k+ 3.9 ?09-02-21: glucose 84; bun 18; creat 0.77; k+ 3.7; na++ 139; ca 9.0; GFR>60; liver normal albumin 4.0 tsh 1.075 free t4: 0.93  ?12-03-21: wbc 3.6; hgb 10.3; hct 33.9; mcv 88.3 plt 220; glucose 87; bun 19; creat 0.92; k+ 4.1; na++ 140; ca 8.9; >60; total protein 6.1 albumin 3.6 ?01-04-22: wbc 5.8; hgb 10.0; hct 32.9; mcv 89.2 plt 188; glucose 78; bun 0.88; creat 0.88; k+ 3.8; na++ 136; ca 9.2; GFR >60 d-dimer 0.54; CRP 1.6  ?01-04-22 (ED) wbc10.6; hgb 12.0; hct 40.6; mcv89.8 plt 229; glucose 160; bun 26; creat 1.24; k+ 4.0; na++ 136; ca 9.0; GFR 44; protein 7.4 albumin 4.0 ?01-08-31: wbc 14.6; hgb 9.5; hct 31.9; mcv 86.4 plt 219; glucose 118; bun 17; creat 0.62; k+ 3.5; na++ 139; ca 8.2;  GFR>60 protein 6.6; albumin 3.1; mag 2.0; CRP 12.2; d-dimer 0.77  ? ?NO NEW LABS.  ? ?Review of Systems  ?Constitutional:  Negative for malaise/fatigue.  ?Respiratory:  Negative for cough and shortness of breath.

## 2022-02-12 DIAGNOSIS — M6281 Muscle weakness (generalized): Secondary | ICD-10-CM | POA: Diagnosis not present

## 2022-02-12 DIAGNOSIS — U071 COVID-19: Secondary | ICD-10-CM | POA: Diagnosis not present

## 2022-02-12 DIAGNOSIS — R2681 Unsteadiness on feet: Secondary | ICD-10-CM | POA: Diagnosis not present

## 2022-02-12 DIAGNOSIS — R6521 Severe sepsis with septic shock: Secondary | ICD-10-CM | POA: Diagnosis not present

## 2022-02-12 DIAGNOSIS — R293 Abnormal posture: Secondary | ICD-10-CM | POA: Diagnosis not present

## 2022-02-12 DIAGNOSIS — J1281 Pneumonia due to SARS-associated coronavirus: Secondary | ICD-10-CM | POA: Diagnosis not present

## 2022-02-12 DIAGNOSIS — J9621 Acute and chronic respiratory failure with hypoxia: Secondary | ICD-10-CM | POA: Diagnosis not present

## 2022-02-12 DIAGNOSIS — J449 Chronic obstructive pulmonary disease, unspecified: Secondary | ICD-10-CM | POA: Diagnosis not present

## 2022-02-12 DIAGNOSIS — R1312 Dysphagia, oropharyngeal phase: Secondary | ICD-10-CM | POA: Diagnosis not present

## 2022-02-15 ENCOUNTER — Other Ambulatory Visit: Payer: Self-pay | Admitting: Adult Health

## 2022-02-15 DIAGNOSIS — M6281 Muscle weakness (generalized): Secondary | ICD-10-CM | POA: Diagnosis not present

## 2022-02-15 DIAGNOSIS — J1281 Pneumonia due to SARS-associated coronavirus: Secondary | ICD-10-CM | POA: Diagnosis not present

## 2022-02-15 DIAGNOSIS — J9621 Acute and chronic respiratory failure with hypoxia: Secondary | ICD-10-CM | POA: Diagnosis not present

## 2022-02-15 DIAGNOSIS — Z1231 Encounter for screening mammogram for malignant neoplasm of breast: Secondary | ICD-10-CM

## 2022-02-15 DIAGNOSIS — R2681 Unsteadiness on feet: Secondary | ICD-10-CM | POA: Diagnosis not present

## 2022-02-15 DIAGNOSIS — J449 Chronic obstructive pulmonary disease, unspecified: Secondary | ICD-10-CM | POA: Diagnosis not present

## 2022-02-15 DIAGNOSIS — R293 Abnormal posture: Secondary | ICD-10-CM | POA: Diagnosis not present

## 2022-02-15 DIAGNOSIS — U071 COVID-19: Secondary | ICD-10-CM | POA: Diagnosis not present

## 2022-02-16 DIAGNOSIS — M6281 Muscle weakness (generalized): Secondary | ICD-10-CM | POA: Diagnosis not present

## 2022-02-16 DIAGNOSIS — R2681 Unsteadiness on feet: Secondary | ICD-10-CM | POA: Diagnosis not present

## 2022-02-16 DIAGNOSIS — J9621 Acute and chronic respiratory failure with hypoxia: Secondary | ICD-10-CM | POA: Diagnosis not present

## 2022-02-16 DIAGNOSIS — J449 Chronic obstructive pulmonary disease, unspecified: Secondary | ICD-10-CM | POA: Diagnosis not present

## 2022-02-16 DIAGNOSIS — R293 Abnormal posture: Secondary | ICD-10-CM | POA: Diagnosis not present

## 2022-02-16 DIAGNOSIS — U071 COVID-19: Secondary | ICD-10-CM | POA: Diagnosis not present

## 2022-02-16 DIAGNOSIS — J1281 Pneumonia due to SARS-associated coronavirus: Secondary | ICD-10-CM | POA: Diagnosis not present

## 2022-02-17 DIAGNOSIS — R293 Abnormal posture: Secondary | ICD-10-CM | POA: Diagnosis not present

## 2022-02-17 DIAGNOSIS — J449 Chronic obstructive pulmonary disease, unspecified: Secondary | ICD-10-CM | POA: Diagnosis not present

## 2022-02-17 DIAGNOSIS — M6281 Muscle weakness (generalized): Secondary | ICD-10-CM | POA: Diagnosis not present

## 2022-02-17 DIAGNOSIS — R2681 Unsteadiness on feet: Secondary | ICD-10-CM | POA: Diagnosis not present

## 2022-02-17 DIAGNOSIS — U071 COVID-19: Secondary | ICD-10-CM | POA: Diagnosis not present

## 2022-02-17 DIAGNOSIS — J1281 Pneumonia due to SARS-associated coronavirus: Secondary | ICD-10-CM | POA: Diagnosis not present

## 2022-02-17 DIAGNOSIS — J9621 Acute and chronic respiratory failure with hypoxia: Secondary | ICD-10-CM | POA: Diagnosis not present

## 2022-02-18 DIAGNOSIS — R2681 Unsteadiness on feet: Secondary | ICD-10-CM | POA: Diagnosis not present

## 2022-02-18 DIAGNOSIS — J9621 Acute and chronic respiratory failure with hypoxia: Secondary | ICD-10-CM | POA: Diagnosis not present

## 2022-02-18 DIAGNOSIS — R293 Abnormal posture: Secondary | ICD-10-CM | POA: Diagnosis not present

## 2022-02-18 DIAGNOSIS — J449 Chronic obstructive pulmonary disease, unspecified: Secondary | ICD-10-CM | POA: Diagnosis not present

## 2022-02-18 DIAGNOSIS — U071 COVID-19: Secondary | ICD-10-CM | POA: Diagnosis not present

## 2022-02-18 DIAGNOSIS — M6281 Muscle weakness (generalized): Secondary | ICD-10-CM | POA: Diagnosis not present

## 2022-02-18 DIAGNOSIS — J1281 Pneumonia due to SARS-associated coronavirus: Secondary | ICD-10-CM | POA: Diagnosis not present

## 2022-02-19 DIAGNOSIS — J449 Chronic obstructive pulmonary disease, unspecified: Secondary | ICD-10-CM | POA: Diagnosis not present

## 2022-02-19 DIAGNOSIS — M6281 Muscle weakness (generalized): Secondary | ICD-10-CM | POA: Diagnosis not present

## 2022-02-19 DIAGNOSIS — U071 COVID-19: Secondary | ICD-10-CM | POA: Diagnosis not present

## 2022-02-19 DIAGNOSIS — R2681 Unsteadiness on feet: Secondary | ICD-10-CM | POA: Diagnosis not present

## 2022-02-19 DIAGNOSIS — J9621 Acute and chronic respiratory failure with hypoxia: Secondary | ICD-10-CM | POA: Diagnosis not present

## 2022-02-19 DIAGNOSIS — J1281 Pneumonia due to SARS-associated coronavirus: Secondary | ICD-10-CM | POA: Diagnosis not present

## 2022-02-19 DIAGNOSIS — R293 Abnormal posture: Secondary | ICD-10-CM | POA: Diagnosis not present

## 2022-02-21 DIAGNOSIS — Z1159 Encounter for screening for other viral diseases: Secondary | ICD-10-CM | POA: Diagnosis not present

## 2022-02-21 DIAGNOSIS — J9611 Chronic respiratory failure with hypoxia: Secondary | ICD-10-CM | POA: Diagnosis not present

## 2022-02-22 DIAGNOSIS — M6281 Muscle weakness (generalized): Secondary | ICD-10-CM | POA: Diagnosis not present

## 2022-02-22 DIAGNOSIS — R293 Abnormal posture: Secondary | ICD-10-CM | POA: Diagnosis not present

## 2022-02-22 DIAGNOSIS — R2681 Unsteadiness on feet: Secondary | ICD-10-CM | POA: Diagnosis not present

## 2022-02-22 DIAGNOSIS — J9621 Acute and chronic respiratory failure with hypoxia: Secondary | ICD-10-CM | POA: Diagnosis not present

## 2022-02-22 DIAGNOSIS — J1281 Pneumonia due to SARS-associated coronavirus: Secondary | ICD-10-CM | POA: Diagnosis not present

## 2022-02-22 DIAGNOSIS — U071 COVID-19: Secondary | ICD-10-CM | POA: Diagnosis not present

## 2022-02-22 DIAGNOSIS — J449 Chronic obstructive pulmonary disease, unspecified: Secondary | ICD-10-CM | POA: Diagnosis not present

## 2022-02-23 ENCOUNTER — Encounter: Payer: Self-pay | Admitting: Internal Medicine

## 2022-02-23 ENCOUNTER — Ambulatory Visit (INDEPENDENT_AMBULATORY_CARE_PROVIDER_SITE_OTHER): Payer: Medicare PPO | Admitting: Internal Medicine

## 2022-02-23 ENCOUNTER — Telehealth: Payer: Self-pay

## 2022-02-23 VITALS — BP 110/60 | HR 72 | Temp 97.5°F | Ht 73.0 in | Wt 162.2 lb

## 2022-02-23 DIAGNOSIS — R195 Other fecal abnormalities: Secondary | ICD-10-CM | POA: Diagnosis not present

## 2022-02-23 DIAGNOSIS — R1312 Dysphagia, oropharyngeal phase: Secondary | ICD-10-CM

## 2022-02-23 DIAGNOSIS — J9621 Acute and chronic respiratory failure with hypoxia: Secondary | ICD-10-CM | POA: Diagnosis not present

## 2022-02-23 DIAGNOSIS — J449 Chronic obstructive pulmonary disease, unspecified: Secondary | ICD-10-CM | POA: Diagnosis not present

## 2022-02-23 DIAGNOSIS — J1281 Pneumonia due to SARS-associated coronavirus: Secondary | ICD-10-CM | POA: Diagnosis not present

## 2022-02-23 DIAGNOSIS — K219 Gastro-esophageal reflux disease without esophagitis: Secondary | ICD-10-CM

## 2022-02-23 DIAGNOSIS — U071 COVID-19: Secondary | ICD-10-CM | POA: Diagnosis not present

## 2022-02-23 DIAGNOSIS — R2681 Unsteadiness on feet: Secondary | ICD-10-CM | POA: Diagnosis not present

## 2022-02-23 DIAGNOSIS — R293 Abnormal posture: Secondary | ICD-10-CM | POA: Diagnosis not present

## 2022-02-23 DIAGNOSIS — M6281 Muscle weakness (generalized): Secondary | ICD-10-CM | POA: Diagnosis not present

## 2022-02-23 NOTE — Patient Instructions (Signed)
It was good to see you again today! ? ?We will proceed with a diagnostic colonoscopy (positive Cologuard) ?ASA 4. The risks, benefits, limitations, alternatives and imponderables have been reviewed with the patient. Questions have been answered. All parties are agreeable.   ? ?Further recommendations to follow. ?

## 2022-02-23 NOTE — Progress Notes (Signed)
? ? ?Primary Care Physician:  Gerlene Fee, NP ?Primary Gastroenterologist:  Dr. Gala Romney ? ?Pre-Procedure History & Physical: ?HPI:  [name] is a 81 y.o. female here with her daughter [name] for further evaluation of a positive Cologuard.  This Cologuard was apparently done by her PCP at the [facility].  This lady has a history of advanced adenomas dealt with 10 years ago.  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  Ultimately, she required surgery.  1 year postop colonoscopy yielded additional adenomas.  She is several years overdue for surveillance colonoscopy.  By all accounts, she is not having any bleeding or change in bowel function.  She has multiple comorbidities including dementia. ?Her provider and family members wish her to have 1 more colonoscopy.  She is not anticoagulated. ? ?She has a history of dysphagia for which her esophagus was dilated in the past.  No dysphagia now.  Reflux well controlled on omeprazole 40 mg daily. ? ? ?Past Medical History:  ?Diagnosis Date  ? Abnormality of gait 06/06/2014  ? Allergic rhinitis   ? Anemia   ? Ankle fracture, right   ? Anxiety   ? Arm fracture, left   ? Arthritis   ? abnormal gait   ? COPD (chronic obstructive pulmonary disease) ([place])   ? chronic CO2 retention, decreased DLCO   ? Cystic acne   ? adult   ? Degenerative disc disease, cervical   ? 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 Degenerative disc disease, lumbar   ? L5 nerve impingement   ? Dementia ([place])   ? Depression   ? DNR (do not resuscitate)   ? Fracture of right proximal fibula 07/13/18 09/05/2018  ? GERD (gastroesophageal reflux disease)   ? Heme positive stool 10/10/2017  ? History of colon surgery   ? right hemicolectomy for high-grade adenomas in right colon 2013  ? Hyperglycemia   ? Hypertension   ? Low back pain   ? Mediastinal lymphadenopathy 1/9  ? resolving   ? Memory difficulty 09/20/2014  ? mild dementia  ? Onychomycosis   ? OSA on CPAP   ?  Sleep apnea   ? stop bang score 6  ? ? ?Past Surgical History:  ?Procedure Laterality Date  ? ABDOMINAL HYSTERECTOMY  1976  ? secondary to bleeding   ? APPENDECTOMY    ? COLON RESECTION  08/18/2012  ? Procedure: HAND ASSISTED LAPAROSCOPIC COLON RESECTION;  Surgeon: Jamesetta So, MD;  Location: [facility];  Service: General;  Laterality: N/A;  ? COLONOSCOPY  07/20/2012  ? Dr. Gala Romney: multiple colonic polyps with large polyps on right side, s/p saline-assisted debulking piecemeal polypectomy and ablation. Not all removed. Path with tubulovillous adenomas, high grade dysplasia.   ? COLONOSCOPY N/A 03/09/2013  ? TDV:VOHYWVP polyps-tubular adenomas. S/p right hemicolectomy. next tcs 02/2018  ? epidural steroids    ? ESOPHAGOGASTRODUODENOSCOPY (EGD) WITH ESOPHAGEAL DILATION N/A 02/06/2014  ? Procedure: ESOPHAGOGASTRODUODENOSCOPY (EGD) WITH ESOPHAGEAL DILATION;  Surgeon: Daneil Dolin, MD;  [facility];  Service: Endoscopy;  Laterality: N/A;  9:30  ? OOPHORECTOMY  1976  ? ORIF ANKLE FRACTURE  01/18/2012  ? Procedure: OPEN REDUCTION INTERNAL FIXATION (ORIF) ANKLE FRACTURE;  Surgeon: Arther Abbott, MD;  Location: [facility];  Service: Orthopedics;  Laterality: Left;  ? ORIF ANKLE FRACTURE Right 01/13/2016  ? Procedure: OPEN REDUCTION INTERNAL FIXATION (ORIF) ANKLE FRACTURE;  Surgeon: Carole Civil, MD;  Location: [facility];  Service: Orthopedics;  Laterality: Right;  ? ? ?Prior to Admission medications   ?Medication Sig Start Date End Date Taking? Authorizing Provider  ?acetaminophen (TYLENOL) 325 MG tablet Take 650 mg by mouth every 8 (eight) hours as needed.   Yes [provider]  ?albuterol (VENTOLIN HFA) 108 (90 Base) MCG/ACT inhaler Inhale 2 puffs into the lungs every 6 (six) hours as needed for wheezing or shortness of breath. 07/14/20  Yes [name], MD  ?alendronate (FOSAMAX) 70 MG tablet Take 70 mg by mouth once a week. Take on an empty stomach at least 30 mns prior to food. Sit upright x 61ms  after taking meds.   Yes [provider]  ?atorvastatin (LIPITOR) 10 MG tablet TAKE 1 TABLET EVERY DAY 02/19/20  Yes SFayrene Helper MD  ?CALCIUM CARBONATE-VITAMIN D PO Take by mouth. 600 mg-10 mcg, oral, Once A Day   Yes [provider]  ?Cholecalciferol (VITAMIN D3 PO) Take 1,000 Units by mouth daily.   Yes [provider]  ?donepezil (ARICEPT) 10 MG tablet TAKE 1 TABLET AT BEDTIME 10/16/20  Yes SFayrene Helper MD  ?guaiFENesin (MUCINEX) 600 MG 12 hr tablet Take by mouth 2 (two) times daily.   Yes [provider]  ?loperamide (IMODIUM A-D) 2 MG tablet Take 2 mg by mouth as needed for diarrhea or loose stools.   Yes [provider]  ?loratadine (CLARITIN) 10 MG tablet Take 10 mg by mouth daily.   Yes [provider]  ?mirabegron ER (MYRBETRIQ) 50 MG TB24 tablet Take 50 mg by mouth daily.   Yes [provider]  ?mirtazapine (REMERON) 7.5 MG tablet Take 7.5 mg by mouth at bedtime.   Yes [provider]  ?NON FORMULARY Diet: Regular diet with chopped meats and gravy to all meats.   Yes [provider]  ?Nutritional Supplements (ENSURE CLEAR) LIQD Twice a day between meals due to weight loss   Yes [provider]  ?omeprazole (PRILOSEC) 40 MG capsule TAKE 1 CAPSULE EVERY DAY 02/19/21  Yes MPerlie Mayo NP  ?OXYGEN Inhale 2 L into the lungs continuous.   Yes [provider]  ?potassium chloride (KLOR-CON) 20 MEQ packet Take 20 mEq by mouth daily.   Yes [provider]  ?STRIVERDI RESPIMAT 2.5 MCG/ACT AERS INHALE 2 PUFFS ONE TIME DAILY AT THE SAME TIME 10/02/20  Yes SFayrene Helper MD  ?verapamil (CALAN-SR) 240 MG CR tablet TAKE 1 TABLET AT BEDTIME 10/02/20  Yes SFayrene Helper MD  ? ? ?Allergies as of 02/23/2022 - Review Complete 02/23/2022  ?Allergen Reaction Noted  ? Fenofibrate Nausea And Vomiting 02/26/2013  ? Pravastatin sodium Nausea And Vomiting   ? Sulfonamide derivatives Other (See  Comments) 02/17/2010  ? Ciprofloxacin Rash   ? ? ?Family History  ?Problem Relation Age of Onset  ? Stomach cancer Father   ? Cancer Father   ? Cancer Mother   ?     brain   ? Breast cancer Mother   ? Arthritis Other   ? ? ?Social History  ? ?Socioeconomic History  ? Marital status: Widowed  ?  Spouse name: Not on file  ? Number of children: 4  ? Years of education: college 1  ? Highest education level: Not on file  ?Occupational History  ? Occupation: employed in the tobacco industry   ?  Employer: RETIRED  ? Occupation: retired  ?Tobacco Use  ? Smoking status: Former  ?  Packs/day: 1.50  ?  Years:  40.00  ?  Pack years: 60.00  ?  Types: Cigarettes  ?  Quit date: 06/21/2006  ?  Years since quitting: 15.6  ? Smokeless tobacco: Never  ?Vaping Use  ? Vaping Use: Never used  ?Substance and Sexual Activity  ? Alcohol use: No  ? Drug use: No  ? Sexual activity: Not Currently  ?Other Topics Concern  ? Not on file  ?Social History Narrative  ? Long term resident of [facility]   ? ?Social Determinants of Health  ? ?Financial Resource Strain: Not on file  ?Food Insecurity: Not on file  ?Transportation Needs: Not on file  ?Physical Activity: Not on file  ?Stress: Not on file  ?Social Connections: Not on file  ?Intimate Partner Violence: Not on file  ? ? ?Review of Systems: ?See HPI, otherwise negative ROS ? ?Physical Exam: ?BP 110/60 (BP Location: Left Arm, Patient Position: Sitting, Cuff Size: Normal)   Pulse 72   Temp (!) 97.5 ?F (36.4 ?C) (Temporal)   Ht '6\' 1"'$  (1.854 m)   Wt 162 lb 3.2 oz (73.6 kg)   SpO2 94%   BMI 21.40 kg/m?  ?General:   Alert,  pleasant and cooperative in NAD; confused at times.  Appears in no acute distress.  Accompanied by her daughter [name]. ?Nasal O2 in place. ?Lungs:  Clear throughout to auscultation.   No wheezes, crackles, or rhonchi. No acute distress. ?Heart:  Regular rate and rhythm; no murmurs, clicks, rubs,  or gallops. ?Abdomen: Non-distended, normal bowel sounds.  Soft and  nontender without appreciable mass or hepatosplenomegaly.  ?Pulses:  Normal pulses noted. ?Extremities:  Without clubbing or edema. ?Rectal: Deferred until the time of colonoscopy. ? ? ?Impression/Plan: 81 year old lady wi

## 2022-02-23 NOTE — Telephone Encounter (Signed)
Tried to call [facility] x2 to schedule TCS w/Propofol ASA 4 w/Dr. Gala Romney. Phone rang many times, no answer. ?

## 2022-02-24 DIAGNOSIS — J449 Chronic obstructive pulmonary disease, unspecified: Secondary | ICD-10-CM | POA: Diagnosis not present

## 2022-02-24 DIAGNOSIS — J1281 Pneumonia due to SARS-associated coronavirus: Secondary | ICD-10-CM | POA: Diagnosis not present

## 2022-02-24 DIAGNOSIS — J9621 Acute and chronic respiratory failure with hypoxia: Secondary | ICD-10-CM | POA: Diagnosis not present

## 2022-02-24 DIAGNOSIS — R293 Abnormal posture: Secondary | ICD-10-CM | POA: Diagnosis not present

## 2022-02-24 DIAGNOSIS — U071 COVID-19: Secondary | ICD-10-CM | POA: Diagnosis not present

## 2022-02-24 DIAGNOSIS — M6281 Muscle weakness (generalized): Secondary | ICD-10-CM | POA: Diagnosis not present

## 2022-02-24 DIAGNOSIS — R2681 Unsteadiness on feet: Secondary | ICD-10-CM | POA: Diagnosis not present

## 2022-02-25 DIAGNOSIS — J449 Chronic obstructive pulmonary disease, unspecified: Secondary | ICD-10-CM | POA: Diagnosis not present

## 2022-02-25 DIAGNOSIS — R2681 Unsteadiness on feet: Secondary | ICD-10-CM | POA: Diagnosis not present

## 2022-02-25 DIAGNOSIS — J9621 Acute and chronic respiratory failure with hypoxia: Secondary | ICD-10-CM | POA: Diagnosis not present

## 2022-02-25 DIAGNOSIS — R293 Abnormal posture: Secondary | ICD-10-CM | POA: Diagnosis not present

## 2022-02-25 DIAGNOSIS — J1281 Pneumonia due to SARS-associated coronavirus: Secondary | ICD-10-CM | POA: Diagnosis not present

## 2022-02-25 DIAGNOSIS — M6281 Muscle weakness (generalized): Secondary | ICD-10-CM | POA: Diagnosis not present

## 2022-02-25 DIAGNOSIS — U071 COVID-19: Secondary | ICD-10-CM | POA: Diagnosis not present

## 2022-02-26 DIAGNOSIS — J449 Chronic obstructive pulmonary disease, unspecified: Secondary | ICD-10-CM | POA: Diagnosis not present

## 2022-02-26 DIAGNOSIS — J9621 Acute and chronic respiratory failure with hypoxia: Secondary | ICD-10-CM | POA: Diagnosis not present

## 2022-02-26 DIAGNOSIS — M6281 Muscle weakness (generalized): Secondary | ICD-10-CM | POA: Diagnosis not present

## 2022-02-26 DIAGNOSIS — R2681 Unsteadiness on feet: Secondary | ICD-10-CM | POA: Diagnosis not present

## 2022-02-26 DIAGNOSIS — J1281 Pneumonia due to SARS-associated coronavirus: Secondary | ICD-10-CM | POA: Diagnosis not present

## 2022-02-26 DIAGNOSIS — R293 Abnormal posture: Secondary | ICD-10-CM | POA: Diagnosis not present

## 2022-02-26 DIAGNOSIS — U071 COVID-19: Secondary | ICD-10-CM | POA: Diagnosis not present

## 2022-02-26 NOTE — Telephone Encounter (Signed)
Will call [facility] when Dr. Roseanne Kaufman June schedule is available. No availability for TCS in May at this time. ?

## 2022-02-26 NOTE — Telephone Encounter (Signed)
Tried to call [facility], phone rang many times, no answer. ?

## 2022-03-01 DIAGNOSIS — U071 COVID-19: Secondary | ICD-10-CM | POA: Diagnosis not present

## 2022-03-01 DIAGNOSIS — R2681 Unsteadiness on feet: Secondary | ICD-10-CM | POA: Diagnosis not present

## 2022-03-01 DIAGNOSIS — J1281 Pneumonia due to SARS-associated coronavirus: Secondary | ICD-10-CM | POA: Diagnosis not present

## 2022-03-01 DIAGNOSIS — M6281 Muscle weakness (generalized): Secondary | ICD-10-CM | POA: Diagnosis not present

## 2022-03-01 DIAGNOSIS — R293 Abnormal posture: Secondary | ICD-10-CM | POA: Diagnosis not present

## 2022-03-01 DIAGNOSIS — J449 Chronic obstructive pulmonary disease, unspecified: Secondary | ICD-10-CM | POA: Diagnosis not present

## 2022-03-01 DIAGNOSIS — J9621 Acute and chronic respiratory failure with hypoxia: Secondary | ICD-10-CM | POA: Diagnosis not present

## 2022-03-03 DIAGNOSIS — R293 Abnormal posture: Secondary | ICD-10-CM | POA: Diagnosis not present

## 2022-03-03 DIAGNOSIS — J449 Chronic obstructive pulmonary disease, unspecified: Secondary | ICD-10-CM | POA: Diagnosis not present

## 2022-03-03 DIAGNOSIS — J9621 Acute and chronic respiratory failure with hypoxia: Secondary | ICD-10-CM | POA: Diagnosis not present

## 2022-03-03 DIAGNOSIS — U071 COVID-19: Secondary | ICD-10-CM | POA: Diagnosis not present

## 2022-03-03 DIAGNOSIS — M6281 Muscle weakness (generalized): Secondary | ICD-10-CM | POA: Diagnosis not present

## 2022-03-03 DIAGNOSIS — R2681 Unsteadiness on feet: Secondary | ICD-10-CM | POA: Diagnosis not present

## 2022-03-03 DIAGNOSIS — J1281 Pneumonia due to SARS-associated coronavirus: Secondary | ICD-10-CM | POA: Diagnosis not present

## 2022-03-04 ENCOUNTER — Ambulatory Visit: Payer: Medicare PPO | Admitting: "Endocrinology

## 2022-03-04 DIAGNOSIS — J449 Chronic obstructive pulmonary disease, unspecified: Secondary | ICD-10-CM | POA: Diagnosis not present

## 2022-03-04 DIAGNOSIS — M6281 Muscle weakness (generalized): Secondary | ICD-10-CM | POA: Diagnosis not present

## 2022-03-04 DIAGNOSIS — J1281 Pneumonia due to SARS-associated coronavirus: Secondary | ICD-10-CM | POA: Diagnosis not present

## 2022-03-04 DIAGNOSIS — U071 COVID-19: Secondary | ICD-10-CM | POA: Diagnosis not present

## 2022-03-04 DIAGNOSIS — R2681 Unsteadiness on feet: Secondary | ICD-10-CM | POA: Diagnosis not present

## 2022-03-04 DIAGNOSIS — J9621 Acute and chronic respiratory failure with hypoxia: Secondary | ICD-10-CM | POA: Diagnosis not present

## 2022-03-04 DIAGNOSIS — R293 Abnormal posture: Secondary | ICD-10-CM | POA: Diagnosis not present

## 2022-03-05 DIAGNOSIS — R293 Abnormal posture: Secondary | ICD-10-CM | POA: Diagnosis not present

## 2022-03-05 DIAGNOSIS — R2681 Unsteadiness on feet: Secondary | ICD-10-CM | POA: Diagnosis not present

## 2022-03-05 DIAGNOSIS — J449 Chronic obstructive pulmonary disease, unspecified: Secondary | ICD-10-CM | POA: Diagnosis not present

## 2022-03-05 DIAGNOSIS — J1281 Pneumonia due to SARS-associated coronavirus: Secondary | ICD-10-CM | POA: Diagnosis not present

## 2022-03-05 DIAGNOSIS — U071 COVID-19: Secondary | ICD-10-CM | POA: Diagnosis not present

## 2022-03-05 DIAGNOSIS — J9621 Acute and chronic respiratory failure with hypoxia: Secondary | ICD-10-CM | POA: Diagnosis not present

## 2022-03-05 DIAGNOSIS — M6281 Muscle weakness (generalized): Secondary | ICD-10-CM | POA: Diagnosis not present

## 2022-03-08 DIAGNOSIS — J1281 Pneumonia due to SARS-associated coronavirus: Secondary | ICD-10-CM | POA: Diagnosis not present

## 2022-03-08 DIAGNOSIS — U071 COVID-19: Secondary | ICD-10-CM | POA: Diagnosis not present

## 2022-03-08 DIAGNOSIS — R293 Abnormal posture: Secondary | ICD-10-CM | POA: Diagnosis not present

## 2022-03-08 DIAGNOSIS — R2681 Unsteadiness on feet: Secondary | ICD-10-CM | POA: Diagnosis not present

## 2022-03-08 DIAGNOSIS — J449 Chronic obstructive pulmonary disease, unspecified: Secondary | ICD-10-CM | POA: Diagnosis not present

## 2022-03-08 DIAGNOSIS — J9621 Acute and chronic respiratory failure with hypoxia: Secondary | ICD-10-CM | POA: Diagnosis not present

## 2022-03-08 DIAGNOSIS — M6281 Muscle weakness (generalized): Secondary | ICD-10-CM | POA: Diagnosis not present

## 2022-03-09 DIAGNOSIS — R2681 Unsteadiness on feet: Secondary | ICD-10-CM | POA: Diagnosis not present

## 2022-03-09 DIAGNOSIS — M6281 Muscle weakness (generalized): Secondary | ICD-10-CM | POA: Diagnosis not present

## 2022-03-09 DIAGNOSIS — J449 Chronic obstructive pulmonary disease, unspecified: Secondary | ICD-10-CM | POA: Diagnosis not present

## 2022-03-09 DIAGNOSIS — J1281 Pneumonia due to SARS-associated coronavirus: Secondary | ICD-10-CM | POA: Diagnosis not present

## 2022-03-09 DIAGNOSIS — J9621 Acute and chronic respiratory failure with hypoxia: Secondary | ICD-10-CM | POA: Diagnosis not present

## 2022-03-09 DIAGNOSIS — U071 COVID-19: Secondary | ICD-10-CM | POA: Diagnosis not present

## 2022-03-09 DIAGNOSIS — R293 Abnormal posture: Secondary | ICD-10-CM | POA: Diagnosis not present

## 2022-03-10 DIAGNOSIS — J449 Chronic obstructive pulmonary disease, unspecified: Secondary | ICD-10-CM | POA: Diagnosis not present

## 2022-03-10 DIAGNOSIS — R2681 Unsteadiness on feet: Secondary | ICD-10-CM | POA: Diagnosis not present

## 2022-03-10 DIAGNOSIS — R293 Abnormal posture: Secondary | ICD-10-CM | POA: Diagnosis not present

## 2022-03-10 DIAGNOSIS — U071 COVID-19: Secondary | ICD-10-CM | POA: Diagnosis not present

## 2022-03-10 DIAGNOSIS — J1281 Pneumonia due to SARS-associated coronavirus: Secondary | ICD-10-CM | POA: Diagnosis not present

## 2022-03-10 DIAGNOSIS — J9621 Acute and chronic respiratory failure with hypoxia: Secondary | ICD-10-CM | POA: Diagnosis not present

## 2022-03-10 DIAGNOSIS — M6281 Muscle weakness (generalized): Secondary | ICD-10-CM | POA: Diagnosis not present

## 2022-03-11 ENCOUNTER — Ambulatory Visit
Admission: RE | Admit: 2022-03-11 | Discharge: 2022-03-11 | Disposition: A | Discharge status: Home / Self Care | Payer: Medicare PPO | Source: Ambulatory Visit | Attending: Adult Health | Admitting: Adult Health

## 2022-03-11 DIAGNOSIS — J1281 Pneumonia due to SARS-associated coronavirus: Secondary | ICD-10-CM | POA: Diagnosis not present

## 2022-03-11 DIAGNOSIS — M6281 Muscle weakness (generalized): Secondary | ICD-10-CM | POA: Diagnosis not present

## 2022-03-11 DIAGNOSIS — Z1231 Encounter for screening mammogram for malignant neoplasm of breast: Secondary | ICD-10-CM | POA: Diagnosis not present

## 2022-03-11 DIAGNOSIS — U071 COVID-19: Secondary | ICD-10-CM | POA: Diagnosis not present

## 2022-03-11 DIAGNOSIS — J9621 Acute and chronic respiratory failure with hypoxia: Secondary | ICD-10-CM | POA: Diagnosis not present

## 2022-03-11 DIAGNOSIS — J449 Chronic obstructive pulmonary disease, unspecified: Secondary | ICD-10-CM | POA: Diagnosis not present

## 2022-03-11 DIAGNOSIS — R293 Abnormal posture: Secondary | ICD-10-CM | POA: Diagnosis not present

## 2022-03-11 DIAGNOSIS — R2681 Unsteadiness on feet: Secondary | ICD-10-CM | POA: Diagnosis not present

## 2022-03-12 ENCOUNTER — Non-Acute Institutional Stay (SKILLED_NURSING_FACILITY): Payer: Medicare PPO | Admitting: Adult Health

## 2022-03-12 ENCOUNTER — Encounter: Payer: Self-pay | Admitting: Adult Health

## 2022-03-12 DIAGNOSIS — J9621 Acute and chronic respiratory failure with hypoxia: Secondary | ICD-10-CM | POA: Diagnosis not present

## 2022-03-12 DIAGNOSIS — U071 COVID-19: Secondary | ICD-10-CM | POA: Diagnosis not present

## 2022-03-12 DIAGNOSIS — M810 Age-related osteoporosis without current pathological fracture: Secondary | ICD-10-CM

## 2022-03-12 DIAGNOSIS — N1831 Chronic kidney disease, stage 3a: Secondary | ICD-10-CM | POA: Diagnosis not present

## 2022-03-12 DIAGNOSIS — M6281 Muscle weakness (generalized): Secondary | ICD-10-CM | POA: Diagnosis not present

## 2022-03-12 DIAGNOSIS — R2681 Unsteadiness on feet: Secondary | ICD-10-CM | POA: Diagnosis not present

## 2022-03-12 DIAGNOSIS — J1281 Pneumonia due to SARS-associated coronavirus: Secondary | ICD-10-CM | POA: Diagnosis not present

## 2022-03-12 DIAGNOSIS — R293 Abnormal posture: Secondary | ICD-10-CM | POA: Diagnosis not present

## 2022-03-12 DIAGNOSIS — D649 Anemia, unspecified: Secondary | ICD-10-CM | POA: Diagnosis not present

## 2022-03-12 DIAGNOSIS — J449 Chronic obstructive pulmonary disease, unspecified: Secondary | ICD-10-CM | POA: Diagnosis not present

## 2022-03-12 DIAGNOSIS — M159 Polyosteoarthritis, unspecified: Secondary | ICD-10-CM | POA: Diagnosis not present

## 2022-03-12 NOTE — Progress Notes (Signed)
?Location:  [place] [facility] Room Number: 149-W ?Place of Service:  SNF (31) ? ? ?CODE STATUS: DNR ? ?Allergies  ?Allergen Reactions  ? Fenofibrate Nausea And Vomiting  ? Pravastatin Sodium Nausea And Vomiting  ? Sulfonamide Derivatives Other (See Comments)  ?  unknown  ? Ciprofloxacin Rash  ? ? ?Chief Complaint  ?Patient presents with  ? Acute Visit  ?  Care plan meeting  ? Medical Management of Chronic Issues  ? ? ?HPI: ? ?We have come together for her care plan meeting. BIMS 4/15 mood 6/30: tires easily; anxious at times. She is nonambulatory has had numerous falls over the past quarter. She requires limited to extensive assist with adls. Is frequently incontinent of bladder and bowel. Dietary: weight is 160.4 pounds is stable feeds self after setup. Is on regular diet; chopped meats with gravy. Therapy; toileting wheelchair mobility. Is to get new wheelchair. Is able to stand 3-3.5 minutes. She is also being seen for the management of her chronic illnesses: Generalized osteoarthritis of multiple sites: Age related osteoporosis without current pathological fracture: Stage 3a chronic kidney disease: Normocytic anemia ? ?Past Medical History:  ?Diagnosis Date  ? Abnormality of gait 06/06/2014  ? Allergic rhinitis   ? Anemia   ? Ankle fracture, right   ? Anxiety   ? Arm fracture, left   ? Arthritis   ? abnormal gait   ? COPD (chronic obstructive pulmonary disease) ([place])   ? chronic CO2 retention, decreased DLCO   ? Cystic acne   ? adult   ? Degenerative disc disease, cervical   ? 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 Degenerative disc disease, lumbar   ? L5 nerve impingement   ? Dementia ([place])   ? Depression   ? DNR (do not resuscitate)   ? Fracture of right proximal fibula 07/13/18 09/05/2018  ? GERD (gastroesophageal reflux disease)   ? Heme positive stool 10/10/2017  ? History of colon surgery   ? right hemicolectomy for high-grade adenomas in right colon 2013  ? Hyperglycemia   ? Hypertension   ? Low back pain   ?  Mediastinal lymphadenopathy 1/9  ? resolving   ? Memory difficulty 09/20/2014  ? mild dementia  ? Onychomycosis   ? OSA on CPAP   ? Sleep apnea   ? stop bang score 6  ? ? ?Past Surgical History:  ?Procedure Laterality Date  ? ABDOMINAL HYSTERECTOMY  1976  ? secondary to bleeding   ? APPENDECTOMY    ? COLON RESECTION  08/18/2012  ? Procedure: HAND ASSISTED LAPAROSCOPIC COLON RESECTION;  Surgeon: Jamesetta So, MD;  Location: [facility];  Service: General;  Laterality: N/A;  ? COLONOSCOPY  07/20/2012  ? Dr. Gala Romney: multiple colonic polyps with large polyps on right side, s/p saline-assisted debulking piecemeal polypectomy and ablation. Not all removed. Path with tubulovillous adenomas, high grade dysplasia.   ? COLONOSCOPY N/A 03/09/2013  ? OEU:MPNTIRW polyps-tubular adenomas. S/p right hemicolectomy. next tcs 02/2018  ? epidural steroids    ? ESOPHAGOGASTRODUODENOSCOPY (EGD) WITH ESOPHAGEAL DILATION N/A 02/06/2014  ? Procedure: ESOPHAGOGASTRODUODENOSCOPY (EGD) WITH ESOPHAGEAL DILATION;  Surgeon: Daneil Dolin, MD;  Location: [facility];  Service: Endoscopy;  Laterality: N/A;  9:30  ? OOPHORECTOMY  1976  ? ORIF ANKLE FRACTURE  01/18/2012  ? Procedure: OPEN REDUCTION INTERNAL FIXATION (ORIF) ANKLE FRACTURE;  Surgeon: Arther Abbott, MD;  Location: [facility];  Service: Orthopedics;  Laterality: Left;  ? ORIF ANKLE FRACTURE Right 01/13/2016  ? Procedure: OPEN  REDUCTION INTERNAL FIXATION (ORIF) ANKLE FRACTURE;  Surgeon: Carole Civil, MD;  Location: [facility];  Service: Orthopedics;  Laterality: Right;  ? ? ?Social History  ? ?Socioeconomic History  ? Marital status: Widowed  ?  Spouse name: Not on file  ? Number of children: 4  ? Years of education: college 1  ? Highest education level: Not on file  ?Occupational History  ? Occupation: employed in the tobacco industry   ?  Employer: RETIRED  ? Occupation: retired  ?Tobacco Use  ? Smoking status: Former  ?  Packs/day: 1.50  ?  Years: 40.00  ?  Pack years: 60.00  ?  Types:  Cigarettes  ?  Quit date: 06/21/2006  ?  Years since quitting: 15.7  ? Smokeless tobacco: Never  ?Vaping Use  ? Vaping Use: Never used  ?Substance and Sexual Activity  ? Alcohol use: No  ? Drug use: No  ? Sexual activity: Not Currently  ?Other Topics Concern  ? Not on file  ?Social History Narrative  ? Long term resident of [facility]   ? ?Social Determinants of Health  ? ?Financial Resource Strain: Not on file  ?Food Insecurity: Not on file  ?Transportation Needs: Not on file  ?Physical Activity: Not on file  ?Stress: Not on file  ?Social Connections: Not on file  ?Intimate Partner Violence: Not on file  ? ?Family History  ?Problem Relation Age of Onset  ? Stomach cancer Father   ? Cancer Father   ? Cancer Mother   ?     brain   ? Breast cancer Mother   ? Arthritis Other   ? ? ? ? ?VITAL SIGNS ?BP 126/75   Pulse 95   Temp 98.4 ?F (36.9 ?C)   Resp 20   Ht '6\' 1"'$  (1.854 m)   Wt 160 lb 6.4 oz (72.8 kg)   SpO2 96%   BMI 21.16 kg/m?  ? ?Outpatient Encounter Medications as of 03/12/2022  ?Medication Sig  ? acetaminophen (TYLENOL) 325 MG tablet Take 650 mg by mouth every 8 (eight) hours as needed.  ? albuterol (VENTOLIN HFA) 108 (90 Base) MCG/ACT inhaler Inhale 2 puffs into the lungs every 6 (six) hours as needed for wheezing or shortness of breath.  ? alendronate (FOSAMAX) 70 MG tablet Take 70 mg by mouth once a week. Take on an empty stomach at least 30 mns prior to food. Sit upright x 35ms after taking meds.  ? atorvastatin (LIPITOR) 10 MG tablet TAKE 1 TABLET EVERY DAY  ? CALCIUM CARBONATE-VITAMIN D PO Take by mouth. 600 mg-10 mcg, oral, Once A Day  ? Cholecalciferol (VITAMIN D3 PO) Take 1,000 Units by mouth daily.  ? donepezil (ARICEPT) 10 MG tablet TAKE 1 TABLET AT BEDTIME  ? Lactase (LACTAID FAST ACT) 9000 units TABS Take by mouth as needed. as needed prior to dairy products  ? loperamide (IMODIUM A-D) 2 MG tablet Take 2 mg by mouth as needed for diarrhea or loose stools.  ? loratadine (CLARITIN) 10 MG tablet Take 10  mg by mouth daily.  ? mirabegron ER (MYRBETRIQ) 50 MG TB24 tablet Take 50 mg by mouth daily.  ? NON FORMULARY Diet: Regular diet with chopped meats and gravy to all meats.  ? Nutritional Supplements (ENSURE CLEAR) LIQD Twice a day between meals due to weight loss  ? omeprazole (PRILOSEC) 40 MG capsule TAKE 1 CAPSULE EVERY DAY  ? OXYGEN Inhale 2 L into the lungs continuous.  ? potassium chloride (KLOR-CON) 20  MEQ packet Take 20 mEq by mouth daily.  ? STRIVERDI RESPIMAT 2.5 MCG/ACT AERS INHALE 2 PUFFS ONE TIME DAILY AT THE SAME TIME  ? verapamil (CALAN-SR) 240 MG CR tablet TAKE 1 TABLET AT BEDTIME  ? guaiFENesin (MUCINEX) 600 MG 12 hr tablet Take by mouth 2 (two) times daily.  ? [DISCONTINUED] mirtazapine (REMERON) 7.5 MG tablet Take 7.5 mg by mouth at bedtime.  ? ?No facility-administered encounter medications on file as of 03/12/2022.  ? ? ? ?SIGNIFICANT DIAGNOSTIC EXAMS ? ? ?LABS REVIEWED PREVIOUS ? ?03-30-21: wbc 5.5; hgb 8.9; hct 30.2; mcv 92.9 plt 210; glucose 78; bun 23; creat 0.86; k+ 3.5; na++ 143; ca 8.6 GFR>60; liver normal albumin 2.9; chol 141; ldl 65; trig 67; hdl 63 ?06-02-21: urine culture: no growth; hep C neg ?07-21-21: wbc 4.8; hgb 10.0; hct 34.4; mcv 87.3 plt 224; glucose 138; bun 22; creat 0.87; k+ 3.4; na++ 130; ca 8.5; GFR>60 ?07-27-21: albumin 3.7; k+ 3.9 ?09-02-21: glucose 84; bun 18; creat 0.77; k+ 3.7; na++ 139; ca 9.0; GFR>60; liver normal albumin 4.0 tsh 1.075 free t4: 0.93  ?12-03-21: wbc 3.6; hgb 10.3; hct 33.9; mcv 88.3 plt 220; glucose 87; bun 19; creat 0.92; k+ 4.1; na++ 140; ca 8.9; >60; total protein 6.1 albumin 3.6 ?01-04-22: wbc 5.8; hgb 10.0; hct 32.9; mcv 89.2 plt 188; glucose 78; bun 0.88; creat 0.88; k+ 3.8; na++ 136; ca 9.2; GFR >60 d-dimer 0.54; CRP 1.6  ?01-04-22 (ED) wbc10.6; hgb 12.0; hct 40.6; mcv89.8 plt 229; glucose 160; bun 26; creat 1.24; k+ 4.0; na++ 136; ca 9.0; GFR 44; protein 7.4 albumin 4.0 ?01-08-31: wbc 14.6; hgb 9.5; hct 31.9; mcv 86.4 plt 219; glucose 118; bun 17;  creat 0.62; k+ 3.5; na++ 139; ca 8.2; GFR>60 protein 6.6; albumin 3.1; mag 2.0; CRP 12.2; d-dimer 0.77  ? ?NO NEW LABS.  ? ?Review of Systems  ?Constitutional:  Negative for malaise/fatigue.  ?Respiratory:  Negative for co

## 2022-03-15 DIAGNOSIS — R2681 Unsteadiness on feet: Secondary | ICD-10-CM | POA: Diagnosis not present

## 2022-03-15 DIAGNOSIS — U071 COVID-19: Secondary | ICD-10-CM | POA: Diagnosis not present

## 2022-03-15 DIAGNOSIS — J1281 Pneumonia due to SARS-associated coronavirus: Secondary | ICD-10-CM | POA: Diagnosis not present

## 2022-03-15 DIAGNOSIS — J9621 Acute and chronic respiratory failure with hypoxia: Secondary | ICD-10-CM | POA: Diagnosis not present

## 2022-03-15 DIAGNOSIS — J449 Chronic obstructive pulmonary disease, unspecified: Secondary | ICD-10-CM | POA: Diagnosis not present

## 2022-03-15 DIAGNOSIS — M6281 Muscle weakness (generalized): Secondary | ICD-10-CM | POA: Diagnosis not present

## 2022-03-15 DIAGNOSIS — R293 Abnormal posture: Secondary | ICD-10-CM | POA: Diagnosis not present

## 2022-03-16 DIAGNOSIS — R2681 Unsteadiness on feet: Secondary | ICD-10-CM | POA: Diagnosis not present

## 2022-03-16 DIAGNOSIS — J1281 Pneumonia due to SARS-associated coronavirus: Secondary | ICD-10-CM | POA: Diagnosis not present

## 2022-03-16 DIAGNOSIS — J9621 Acute and chronic respiratory failure with hypoxia: Secondary | ICD-10-CM | POA: Diagnosis not present

## 2022-03-16 DIAGNOSIS — U071 COVID-19: Secondary | ICD-10-CM | POA: Diagnosis not present

## 2022-03-16 DIAGNOSIS — R293 Abnormal posture: Secondary | ICD-10-CM | POA: Diagnosis not present

## 2022-03-16 DIAGNOSIS — M6281 Muscle weakness (generalized): Secondary | ICD-10-CM | POA: Diagnosis not present

## 2022-03-16 DIAGNOSIS — J449 Chronic obstructive pulmonary disease, unspecified: Secondary | ICD-10-CM | POA: Diagnosis not present

## 2022-03-17 DIAGNOSIS — J1281 Pneumonia due to SARS-associated coronavirus: Secondary | ICD-10-CM | POA: Diagnosis not present

## 2022-03-17 DIAGNOSIS — R293 Abnormal posture: Secondary | ICD-10-CM | POA: Diagnosis not present

## 2022-03-17 DIAGNOSIS — J449 Chronic obstructive pulmonary disease, unspecified: Secondary | ICD-10-CM | POA: Diagnosis not present

## 2022-03-17 DIAGNOSIS — U071 COVID-19: Secondary | ICD-10-CM | POA: Diagnosis not present

## 2022-03-17 DIAGNOSIS — R2681 Unsteadiness on feet: Secondary | ICD-10-CM | POA: Diagnosis not present

## 2022-03-17 DIAGNOSIS — J9621 Acute and chronic respiratory failure with hypoxia: Secondary | ICD-10-CM | POA: Diagnosis not present

## 2022-03-17 DIAGNOSIS — M6281 Muscle weakness (generalized): Secondary | ICD-10-CM | POA: Diagnosis not present

## 2022-03-18 DIAGNOSIS — J449 Chronic obstructive pulmonary disease, unspecified: Secondary | ICD-10-CM | POA: Diagnosis not present

## 2022-03-18 DIAGNOSIS — U071 COVID-19: Secondary | ICD-10-CM | POA: Diagnosis not present

## 2022-03-18 DIAGNOSIS — J1281 Pneumonia due to SARS-associated coronavirus: Secondary | ICD-10-CM | POA: Diagnosis not present

## 2022-03-18 DIAGNOSIS — M6281 Muscle weakness (generalized): Secondary | ICD-10-CM | POA: Diagnosis not present

## 2022-03-18 DIAGNOSIS — R2681 Unsteadiness on feet: Secondary | ICD-10-CM | POA: Diagnosis not present

## 2022-03-18 DIAGNOSIS — R293 Abnormal posture: Secondary | ICD-10-CM | POA: Diagnosis not present

## 2022-03-18 DIAGNOSIS — J9621 Acute and chronic respiratory failure with hypoxia: Secondary | ICD-10-CM | POA: Diagnosis not present

## 2022-03-19 DIAGNOSIS — J1281 Pneumonia due to SARS-associated coronavirus: Secondary | ICD-10-CM | POA: Diagnosis not present

## 2022-03-19 DIAGNOSIS — R2681 Unsteadiness on feet: Secondary | ICD-10-CM | POA: Diagnosis not present

## 2022-03-19 DIAGNOSIS — J449 Chronic obstructive pulmonary disease, unspecified: Secondary | ICD-10-CM | POA: Diagnosis not present

## 2022-03-19 DIAGNOSIS — M6281 Muscle weakness (generalized): Secondary | ICD-10-CM | POA: Diagnosis not present

## 2022-03-19 DIAGNOSIS — R293 Abnormal posture: Secondary | ICD-10-CM | POA: Diagnosis not present

## 2022-03-19 DIAGNOSIS — U071 COVID-19: Secondary | ICD-10-CM | POA: Diagnosis not present

## 2022-03-19 DIAGNOSIS — J9621 Acute and chronic respiratory failure with hypoxia: Secondary | ICD-10-CM | POA: Diagnosis not present

## 2022-03-19 NOTE — Telephone Encounter (Signed)
Tried to call [facility], phone rang many times, no answer ?

## 2022-03-22 DIAGNOSIS — J9621 Acute and chronic respiratory failure with hypoxia: Secondary | ICD-10-CM | POA: Diagnosis not present

## 2022-03-22 DIAGNOSIS — R293 Abnormal posture: Secondary | ICD-10-CM | POA: Diagnosis not present

## 2022-03-22 DIAGNOSIS — M6281 Muscle weakness (generalized): Secondary | ICD-10-CM | POA: Diagnosis not present

## 2022-03-22 DIAGNOSIS — J1281 Pneumonia due to SARS-associated coronavirus: Secondary | ICD-10-CM | POA: Diagnosis not present

## 2022-03-22 DIAGNOSIS — J449 Chronic obstructive pulmonary disease, unspecified: Secondary | ICD-10-CM | POA: Diagnosis not present

## 2022-03-22 DIAGNOSIS — U071 COVID-19: Secondary | ICD-10-CM | POA: Diagnosis not present

## 2022-03-22 DIAGNOSIS — R2681 Unsteadiness on feet: Secondary | ICD-10-CM | POA: Diagnosis not present

## 2022-04-06 ENCOUNTER — Non-Acute Institutional Stay (SKILLED_NURSING_FACILITY): Payer: Medicare PPO | Admitting: Adult Health

## 2022-04-06 ENCOUNTER — Encounter: Payer: Self-pay | Admitting: Adult Health

## 2022-04-06 DIAGNOSIS — I10 Essential (primary) hypertension: Secondary | ICD-10-CM | POA: Diagnosis not present

## 2022-04-06 DIAGNOSIS — M159 Polyosteoarthritis, unspecified: Secondary | ICD-10-CM

## 2022-04-06 DIAGNOSIS — F0390 Unspecified dementia without behavioral disturbance: Secondary | ICD-10-CM

## 2022-04-06 DIAGNOSIS — D649 Anemia, unspecified: Secondary | ICD-10-CM

## 2022-04-06 DIAGNOSIS — N1831 Chronic kidney disease, stage 3a: Secondary | ICD-10-CM

## 2022-04-06 DIAGNOSIS — R1312 Dysphagia, oropharyngeal phase: Secondary | ICD-10-CM | POA: Diagnosis not present

## 2022-04-06 DIAGNOSIS — J9612 Chronic respiratory failure with hypercapnia: Secondary | ICD-10-CM

## 2022-04-06 DIAGNOSIS — J9611 Chronic respiratory failure with hypoxia: Secondary | ICD-10-CM

## 2022-04-06 DIAGNOSIS — F0150 Vascular dementia without behavioral disturbance: Secondary | ICD-10-CM | POA: Diagnosis not present

## 2022-04-06 DIAGNOSIS — E785 Hyperlipidemia, unspecified: Secondary | ICD-10-CM

## 2022-04-06 DIAGNOSIS — R7989 Other specified abnormal findings of blood chemistry: Secondary | ICD-10-CM

## 2022-04-06 DIAGNOSIS — R0902 Hypoxemia: Secondary | ICD-10-CM

## 2022-04-06 DIAGNOSIS — J449 Chronic obstructive pulmonary disease, unspecified: Secondary | ICD-10-CM

## 2022-04-06 DIAGNOSIS — F324 Major depressive disorder, single episode, in partial remission: Secondary | ICD-10-CM

## 2022-04-06 DIAGNOSIS — M810 Age-related osteoporosis without current pathological fracture: Secondary | ICD-10-CM | POA: Diagnosis not present

## 2022-04-06 DIAGNOSIS — K219 Gastro-esophageal reflux disease without esophagitis: Secondary | ICD-10-CM | POA: Diagnosis not present

## 2022-04-06 NOTE — Progress Notes (Signed)
Location:  [place] Room Number: South/149/W Place of Service:  SNF (31); Gerlene Fee, NP    CODE STATUS: DNR  Allergies  Allergen Reactions   Fenofibrate Nausea And Vomiting   Pravastatin Sodium Nausea And Vomiting   Sulfonamide Derivatives Other (See Comments)    unknown   Ciprofloxacin Rash    Chief Complaint  Patient presents with   Annual Exam        HPI:  She is an 81 year old long term resident of this facility being seen for her annual exam. She has been hospitalized one time in February of this year for acute respiratory failure. Overall there is little change in her status. She denies any pain. There is no reports of shortness of breath is on chronic 02. There are no reports of significant weight change with her current  weight of 159 pounds. 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  Past Medical History:  Diagnosis Date   Abnormality of gait 06/06/2014   Allergic rhinitis    Anemia    Ankle fracture, right    Anxiety    Arm fracture, left    Arthritis    abnormal gait    COPD (chronic obstructive pulmonary disease) ([facility])    chronic CO2 retention, decreased DLCO    Cystic acne    adult    Degenerative disc disease, cervical    syrinx C3-7   Degenerative disc disease, lumbar    L5 nerve impingement    Dementia ([facility])    Depression    DNR (do not resuscitate)    Fracture of right proximal fibula 07/13/18 09/05/2018   GERD (gastroesophageal reflux disease)    Heme positive stool 10/10/2017   History of colon surgery    right hemicolectomy for high-grade adenomas in right colon 2013   Hyperglycemia    Hypertension    Low back pain    Mediastinal lymphadenopathy 1/9   resolving    Memory  difficulty 09/20/2014   mild dementia   Onychomycosis    OSA on CPAP    Sleep apnea    stop bang score 6    Past Surgical History:  Procedure Laterality Date   ABDOMINAL HYSTERECTOMY  1976   secondary to bleeding    APPENDECTOMY     COLON RESECTION  08/18/2012   Procedure: HAND ASSISTED LAPAROSCOPIC COLON RESECTION;  Surgeon: Jamesetta So, MD;  Location: [facility];  Service: General;  Laterality: N/A;   COLONOSCOPY  07/20/2012   Dr. Gala Romney: multiple colonic polyps with large polyps on right side, s/p saline-assisted debulking piecemeal polypectomy and ablation. Not all removed. Path with tubulovillous adenomas, high grade dysplasia.    COLONOSCOPY N/A 03/09/2013   CHE:NIDPOEU polyps-tubular adenomas. S/p right hemicolectomy. next tcs 02/2018   epidural steroids     ESOPHAGOGASTRODUODENOSCOPY (EGD) WITH ESOPHAGEAL DILATION N/A 02/06/2014   Procedure: ESOPHAGOGASTRODUODENOSCOPY (EGD) WITH ESOPHAGEAL DILATION;  Surgeon: Daneil Dolin, MD;  Location: [facility];  Service: Endoscopy;  Laterality: N/A;  9:30   OOPHORECTOMY  1976   ORIF ANKLE FRACTURE  01/18/2012   Procedure: OPEN REDUCTION INTERNAL FIXATION (ORIF) ANKLE FRACTURE;  Surgeon: Arther Abbott, MD;  Location: [facility];  Service: Orthopedics;  Laterality: Left;   ORIF ANKLE FRACTURE Right 01/13/2016   Procedure: OPEN REDUCTION INTERNAL FIXATION (ORIF) ANKLE FRACTURE;  Surgeon: Carole Civil, MD;  Location: [facility];  Service: Orthopedics;  Laterality: Right;    Social History   Socioeconomic History   Marital status: Widowed    Spouse name: Not on file   Number of children: 4   Years of education: college 1   Highest education level: Not on file  Occupational History   Occupation: employed in the tobacco industry     Employer: RETIRED   Occupation: retired  Tobacco Use   Smoking status: Former    Packs/day: 1.50    Years: 40.00    Pack years: 60.00    Types: Cigarettes    Quit date: 06/21/2006    Years since quitting:  15.8   Smokeless tobacco: Never  Vaping Use   Vaping Use: Never used  Substance and Sexual Activity   Alcohol use: No   Drug use: No   Sexual activity: Not Currently  Other Topics Concern   Not on file  Social History Narrative   Long term resident of [facility]    Social Determinants of Health   Financial Resource Strain: Not on file  Food Insecurity: Not on file  Transportation Needs: Not on file  Physical Activity: Not on file  Stress: Not on file  Social Connections: Not on file  Intimate Partner Violence: Not on file   Family History  Problem Relation Age of Onset   Stomach cancer Father    Cancer Father    Cancer Mother        brain    Breast cancer Mother    Arthritis Other       VITAL SIGNS BP 122/66   Pulse 82   Temp (!) 97.1 F (36.2 C)   Resp 20   Ht '6\' 1"'$  (1.854 m)   Wt 159 lb (72.1 kg)   SpO2 95%   BMI 20.98 kg/m   Outpatient Encounter Medications as of 04/06/2022  Medication Sig   acetaminophen (TYLENOL) 325 MG tablet Take 650 mg by mouth every 8 (eight) hours as needed.   albuterol (VENTOLIN HFA) 108 (90 Base) MCG/ACT inhaler Inhale 2 puffs into the lungs every 6 (six) hours as needed for wheezing or shortness of breath.   alendronate (FOSAMAX) 70 MG tablet Take 70 mg by mouth once a week. Take on an empty stomach at least 30 mns prior to food. Sit upright x 13ms after taking meds.   atorvastatin (LIPITOR) 10 MG tablet TAKE 1 TABLET EVERY DAY   CALCIUM CARBONATE-VITAMIN D PO Take by mouth. 600 mg-10 mcg, oral, Once A Day   Cholecalciferol (VITAMIN D3 PO) Take 1,000 Units by mouth daily.   donepezil (ARICEPT) 10 MG tablet TAKE 1 TABLET AT BEDTIME   Lactase (LACTAID FAST ACT) 9000 units TABS Take by mouth as needed. as needed prior to dairy products   loperamide (IMODIUM A-D) 2 MG tablet Take 2 mg by mouth as needed for diarrhea or loose stools.   loratadine (CLARITIN) 10 MG tablet Take 10 mg by mouth daily.   mirabegron ER (MYRBETRIQ) 50 MG TB24  tablet Take 50 mg by mouth daily.   NON FORMULARY Diet: Regular diet with chopped meats and gravy to all meats.   Nutritional Supplements (ENSURE CLEAR) LIQD Twice a day between meals due to weight loss   omeprazole (PRILOSEC) 40 MG capsule TAKE 1 CAPSULE EVERY DAY   OXYGEN Inhale 2 L into the lungs continuous.   potassium chloride (KLOR-CON) 20 MEQ packet Take 20 mEq by mouth daily.  STRIVERDI RESPIMAT 2.5 MCG/ACT AERS INHALE 2 PUFFS ONE TIME DAILY AT THE SAME TIME   verapamil (CALAN-SR) 240 MG CR tablet TAKE 1 TABLET AT BEDTIME   [DISCONTINUED] guaiFENesin (MUCINEX) 600 MG 12 hr tablet Take by mouth 2 (two) times daily.   No facility-administered encounter medications on file as of 04/06/2022.     SIGNIFICANT DIAGNOSTIC EXAMS  LABS REVIEWED PREVIOUS  03-30-21: wbc 5.5; hgb 8.9; hct 30.2; mcv 92.9 plt 210; glucose 78; bun 23; creat 0.86; k+ 3.5; na++ 143; ca 8.6 GFR>60; liver normal albumin 2.9; chol 141; ldl 65; trig 67; hdl 63 06-02-21: urine culture: no growth; hep C neg 07-21-21: wbc 4.8; hgb 10.0; hct 34.4; mcv 87.3 plt 224; glucose 138; bun 22; creat 0.87; k+ 3.4; na++ 130; ca 8.5; GFR>60 07-27-21: albumin 3.7; k+ 3.9 09-02-21: glucose 84; bun 18; creat 0.77; k+ 3.7; na++ 139; ca 9.0; GFR>60; liver normal albumin 4.0 tsh 1.075 free t4: 0.93  12-03-21: wbc 3.6; hgb 10.3; hct 33.9; mcv 88.3 plt 220; glucose 87; bun 19; creat 0.92; k+ 4.1; na++ 140; ca 8.9; >60; total protein 6.1 albumin 3.6 01-04-22: wbc 5.8; hgb 10.0; hct 32.9; mcv 89.2 plt 188; glucose 78; bun 0.88; creat 0.88; k+ 3.8; na++ 136; ca 9.2; GFR >60 d-dimer 0.54; CRP 1.6  01-04-22 (ED) wbc10.6; hgb 12.0; hct 40.6; mcv89.8 plt 229; glucose 160; bun 26; creat 1.24; k+ 4.0; na++ 136; ca 9.0; GFR 44; protein 7.4 albumin 4.0 01-08-31: wbc 14.6; hgb 9.5; hct 31.9; mcv 86.4 plt 219; glucose 118; bun 17; creat 0.62; k+ 3.5; na++ 139; ca 8.2; GFR>60 protein 6.6; albumin 3.1; mag 2.0; CRP 12.2; d-dimer 0.77   NO NEW LABS.   Review of  Systems  Constitutional:  Negative for malaise/fatigue.  Respiratory:  Negative for cough and shortness of breath.   Cardiovascular:  Negative for chest pain, palpitations and leg swelling.  Gastrointestinal:  Negative for abdominal pain, constipation and heartburn.  Musculoskeletal:  Negative for back pain, joint pain and myalgias.  Skin: Negative.   Neurological:  Negative for dizziness.  Psychiatric/Behavioral:  The patient is not nervous/anxious.      Physical Exam Constitutional:      General: She is not in acute distress.    Appearance: She is well-developed. She is not diaphoretic.  HENT:     Nose: Nose normal.     Mouth/Throat:     Mouth: Mucous membranes are moist.     Pharynx: Oropharynx is clear.  Neck:     Thyroid: No thyromegaly.  Cardiovascular:     Rate and Rhythm: Normal rate and regular rhythm.     Pulses: Normal pulses.     Heart sounds: Murmur heard.  Pulmonary:     Effort: Pulmonary effort is normal. No respiratory distress.     Breath sounds: Normal breath sounds.  Abdominal:     General: Bowel sounds are normal. There is no distension.     Palpations: Abdomen is soft.     Tenderness: There is no abdominal tenderness.  Musculoskeletal:        General: Normal range of motion.     Cervical back: Neck supple.     Right lower leg: No edema.     Left lower leg: No edema.  Lymphadenopathy:     Cervical: No cervical adenopathy.  Skin:    General: Skin is warm and dry.  Neurological:     Mental Status: She is alert. Mental status is at baseline.  Psychiatric:  Mood and Affect: Mood normal.       ASSESSMENT AND PLAN   TODAY  Vitamin D deficiency: will continue vitamin D 50,000 units weekly will check vitamin D   2. Hyperlipidemia LDL goal <100: LDL 65; will continue lipitor 10 mg daily   3. Low thyroid stimulating hormone (tsh) tsh 1.075 free t4: 0.93 will monitor  4. 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 Urinary  frequency: will continue myrbetriq 50 mg daily   6. Chronic respiratory failure with hypoxia and hypercapnia COPD; on 02; will continue albuterol 2 puffs every 4 hours as needed; strivirdi respimat 2.5 mcg 2 puffs twice daily; claritin 10 mg daily   7. Essential hypertension b/p 122/66 will continue calan sr 240 gm daily no longer on norvasc  8. Esophageal reflux disease without esophagitis: will continue prilosec 40 mg daily   9. 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 Generalized osteoarthritis of multiple sites: has tylenol every 8 hours as needed   11. Age related osteoporosis without current pathological fractures: will continue fosamax 70 mg weekly   12. Stage 3a chronic kidney disease: bun 17; creat 0.62 gfr >60  13. Normocytic anemia; hgb 9,5 will monitor  14. Dysphagia oropharyngeal phase: no indications of aspiration present.       Ok Edwards NP [facility]  call [phone]

## 2022-04-06 NOTE — Addendum Note (Signed)
Addended by: Gerlene Fee on: 04/06/2022 12:51 PM   Modules accepted: Level of Service

## 2022-04-07 ENCOUNTER — Other Ambulatory Visit: Payer: Self-pay

## 2022-04-07 MED ORDER — PEG 3350-KCL-NA BICARB-NACL 420 G PO SOLR: 4000.0000 mL | ORAL | 0 refills | Status: DC

## 2022-04-07 NOTE — Telephone Encounter (Signed)
Spoke to nurse Sherron Flemings) at [facility], TCS scheduled for 05/26/22 at 8:30am. Rx for prep sent to pharmacy. Orders entered. Fax# [phone].  PA for TCS submitted via [facility] website. PA# [id number], valid 05/26/22-08/24/22.

## 2022-04-07 NOTE — Telephone Encounter (Signed)
Nurse at [facility] called office to schedule TCS. Advised her we will call back when Dr. Roseanne Kaufman next schedule is available. She gave different phone# [phone].

## 2022-04-08 NOTE — Telephone Encounter (Signed)
Pre-op appt 05/21/22. Appt letter and procedure instructions faxed to [facility].

## 2022-04-22 DIAGNOSIS — M79642 Pain in left hand: Secondary | ICD-10-CM | POA: Diagnosis not present

## 2022-04-22 DIAGNOSIS — M19042 Primary osteoarthritis, left hand: Secondary | ICD-10-CM | POA: Diagnosis not present

## 2022-04-22 DIAGNOSIS — M19032 Primary osteoarthritis, left wrist: Secondary | ICD-10-CM | POA: Diagnosis not present

## 2022-04-22 DIAGNOSIS — M25532 Pain in left wrist: Secondary | ICD-10-CM | POA: Diagnosis not present

## 2022-04-28 ENCOUNTER — Encounter: Payer: Self-pay | Admitting: Internal Medicine

## 2022-04-28 ENCOUNTER — Non-Acute Institutional Stay (SKILLED_NURSING_FACILITY): Payer: Medicare PPO | Admitting: Internal Medicine

## 2022-04-28 DIAGNOSIS — R0902 Hypoxemia: Secondary | ICD-10-CM | POA: Diagnosis not present

## 2022-04-28 DIAGNOSIS — I10 Essential (primary) hypertension: Secondary | ICD-10-CM

## 2022-04-28 DIAGNOSIS — R7989 Other specified abnormal findings of blood chemistry: Secondary | ICD-10-CM

## 2022-04-28 DIAGNOSIS — N183 Chronic kidney disease, stage 3 unspecified: Secondary | ICD-10-CM

## 2022-04-28 DIAGNOSIS — J449 Chronic obstructive pulmonary disease, unspecified: Secondary | ICD-10-CM | POA: Diagnosis not present

## 2022-04-28 DIAGNOSIS — F0150 Vascular dementia without behavioral disturbance: Secondary | ICD-10-CM

## 2022-04-28 DIAGNOSIS — G4733 Obstructive sleep apnea (adult) (pediatric): Secondary | ICD-10-CM

## 2022-04-28 NOTE — Assessment & Plan Note (Signed)
BP controlled; no change in antihypertensive medications  

## 2022-04-28 NOTE — Assessment & Plan Note (Addendum)
STOP BANG assessment apparently suggesting sleep apnea.  By history she has been noncompliant with CPAP.  A sleep study had been ordered ; but she has advanced dementia & can not comply with CPAP.

## 2022-04-28 NOTE — Assessment & Plan Note (Addendum)
She can provide no meaningful history.  Her responses to review of systems was a monosyllabic "no."  She denied history of colon polyps, colon surgery, diagnosis of sleep apnea, or use of CPAP.

## 2022-04-28 NOTE — Assessment & Plan Note (Signed)
Current creatinine and GFR indicate CKD stage II.  Medication list reviewed; no change indicated unless there is progression of CKD.

## 2022-04-28 NOTE — Assessment & Plan Note (Addendum)
O2 sats are excellent on present regimen and nasal oxygen; no change indicated.

## 2022-04-28 NOTE — Assessment & Plan Note (Signed)
Follow-up TSH on 09/02/2021 revealed a therapeutic value of 1.075.  Continue to monitor every 6-8 months.

## 2022-04-28 NOTE — Patient Instructions (Signed)
See assessment and plan under each diagnosis in the problem list and acutely for this visit 

## 2022-04-28 NOTE — Progress Notes (Signed)
NURSING HOME LOCATION:  [facility] ROOM NUMBER:  36 W  CODE STATUS:  DNR  PCP:  Ok Edwards NP,PSC  This is a nursing facility follow up visit of chronic medical diagnoses & to document compliance with Regulation 483.30 (c) in The [place] Manual Phase 2 which mandates caregiver visit ( visits can alternate among physician, PA or NP as per statutes) within 10 days of 30 days / 60 days/ 90 days post admission to SNF date    Interim medical record and care since last SNF visit was updated with review of diagnostic studies and change in clinical status since last visit were documented.  HPI: She is a permanent resident of facility with medical diagnoses of essential hypertension, OSA, oropharyngeal phase dysphagia, COPD with hypoxia, GERD, stage III CKD, chronic anemia, history of colon polyps, dyslipidemia, history of tobacco abuse ( 60-pack-year history), and vascular dementia. Significant surgeries and procedures include colon resection.  Serial labs reveal a slightly normocytic, hypochromic anemia which is essentially stable.  Current H/H is 9.5/31.9 with a range of 9.2/30.9 up to 10.9/35.7.  Despite indices ferritin was normal at 193.  Current total protein is 6.6 but it has been as low as 6.1.  Albumin is currently 3.1 with a prior level of 2.9.  CKD stage II was present with GFR greater than 60.  Previously TSH had been as low as 0.336 but most recent value 09/02/2021 revealed therapeutic value of 1.075.  Review of systems: Dementia invalidated responses.  She states that she was "doing fine.".  She denied a diagnosis of obstructive sleep apnea or ever being prescribed CPAP machine.  She denied any active cardiopulmonary symptoms.  She denied ever having colon polyps or any abdominal surgery.  Her response to all questions was a monosyllabic "no."  Constitutional: No fever, significant weight change, fatigue  Eyes: No redness, discharge, pain, vision  change ENT/mouth: No nasal congestion,  purulent discharge, earache, change in hearing, sore throat  Cardiovascular: No chest pain, palpitations, paroxysmal nocturnal dyspnea, claudication, edema  Respiratory: No cough, sputum production, hemoptysis, DOE, significant snoring, apnea   Gastrointestinal: No heartburn, dysphagia, abdominal pain, nausea /vomiting, rectal bleeding, melena, change in bowels Genitourinary: No dysuria, hematuria, pyuria, incontinence, nocturia Musculoskeletal: No joint stiffness, joint swelling, weakness, pain Dermatologic: No rash, pruritus, change in appearance of skin Neurologic: No dizziness, headache, syncope, seizures, numbness, tingling Psychiatric: No significant anxiety, depression, insomnia, anorexia Endocrine: No change in hair/skin/nails, excessive thirst, excessive hunger, excessive urination  Hematologic/lymphatic: No significant bruising, lymphadenopathy, abnormal bleeding Allergy/immunology: No itchy/watery eyes, significant sneezing, urticaria, angioedema  Physical exam:  Pertinent or positive findings: She is in the wheelchair and wearing nasal oxygen.  Facies tend to be blank.  She has a subtle tremor of the head.  She has complete dentures.  Breath sounds are markedly decreased except for minimal rales in the right lower lobe.  Pedal pulses are not palpable.  Although limbs are somewhat atrophic and she has some interosseous wasting; strength to opposition is fair and symmetric.  The lower extremities may be stronger than the upper extremities but there is only subtle difference if any.  General appearance: no acute distress, increased work of breathing is present.   Lymphatic: No lymphadenopathy about the head, neck, axilla. Eyes: No conjunctival inflammation or lid edema is present. There is no scleral icterus. Ears:  External ear exam shows no significant lesions or deformities.   Nose:  External nasal examination shows no deformity  or  inflammation. Nasal mucosa are pink and moist without lesions, exudates Neck:  No thyromegaly, masses, tenderness noted.    Heart:  Normal rate and regular rhythm. S1 and S2 normal without gallop, murmur, click, rub .  Lungs:  without wheezes, rhonchi,  rubs. Abdomen: Bowel sounds are normal. Abdomen is soft and nontender with no organomegaly, hernias, masses. GU: Deferred  Extremities:  No cyanosis, clubbing, edema  Neurologic exam : Balance, Rhomberg, finger to nose testing could not be completed due to clinical state Skin: Warm & dry w/o tenting. No significant lesions or rash.  See summary under each active problem in the Problem List with associated updated therapeutic plan

## 2022-05-04 ENCOUNTER — Telehealth: Payer: Self-pay

## 2022-05-04 NOTE — Telephone Encounter (Signed)
[name] was advised and stated someone at [facility] advised her family declined order.

## 2022-05-04 NOTE — Telephone Encounter (Signed)
Sharee Pimple w/ the sleep center called requesting a call back from Jerene Dilling concerning patients order. She stated that a different order was going to be updated for in lab. He contact number is [phone].

## 2022-05-07 ENCOUNTER — Encounter: Payer: Medicaid Other | Admitting: Neurology

## 2022-05-19 NOTE — Patient Instructions (Signed)
SHYAN SCALISI  05/19/2022     '@[email or web address]'$ @  Your procedure is scheduled on  05/26/2022.   Report to [facility] at  0700  A.M.   Call this number if you have problems the morning of surgery:  [phone]   Remember:  Follow the diet and prep instructions given to you by the office.    Use your inhalers before you come and bring your rescue inhalers wit you.    Take these medicines the morning of surgery with A SIP OF WATER                                       claritin, prilosec.     Do not wear jewelry, make-up or nail polish.  Do not wear lotions, powders, or perfumes, or deodorant.  Do not shave 48 hours prior to surgery.  Men may shave face and neck.  Do not bring valuables to the hospital.  [facility] is not responsible for any belongings or valuables.  Contacts, dentures or bridgework may not be worn into surgery.  Leave your suitcase in the car.  After surgery it may be brought to your room.  For patients admitted to the hospital, discharge time will be determined by your treatment team.  Patients discharged the day of surgery will not be allowed to drive home and must have someone with them for 24 hours.    Special instructions:   DO NOT smoke tobacco or vape for 24 hours before your procedure.  Please read over the following fact sheets that you were given. Anesthesia Post-op Instructions and Care and Recovery After Surgery      Colonoscopy, Adult, Care After The following information offers guidance on how to care for yourself after your procedure. Your health care provider may also give you more specific instructions. If you have problems or questions, contact your health care provider. What can I expect after the procedure? After the procedure, it is common to have: A small amount of blood in your stool for 24 hours after the procedure. Some gas. Mild cramping or bloating of your abdomen. Follow these instructions at home: Eating  and drinking  Drink enough fluid to keep your urine pale yellow. Follow instructions from your health care provider about eating or drinking restrictions. Resume your normal diet as told by your health care provider. Avoid heavy or fried foods that are hard to digest. Activity Rest as told by your health care provider. Avoid sitting for a long time without moving. Get up to take short walks every 1-2 hours. This is important to improve blood flow and breathing. Ask for help if you feel weak or unsteady. Return to your normal activities as told by your health care provider. Ask your health care provider what activities are safe for you. Managing cramping and bloating  Try walking around when you have cramps or feel bloated. If directed, apply heat to your abdomen as told by your health care provider. Use the heat source that your health care provider recommends, such as a moist heat pack or a heating pad. Place a towel between your skin and the heat source. Leave the heat on for 20-30 minutes. Remove the heat if your skin turns bright red. This is especially important if you are unable to feel pain, heat, or cold. You have a greater risk of getting burned.  General instructions If you were given a sedative during the procedure, it can affect you for several hours. Do not drive or operate machinery until your health care provider says that it is safe. For the first 24 hours after the procedure: Do not sign important documents. Do not drink alcohol. Do your regular daily activities at a slower pace than normal. Eat soft foods that are easy to digest. Take over-the-counter and prescription medicines only as told by your health care provider. Keep all follow-up visits. This is important. Contact a health care provider if: You have blood in your stool 2-3 days after the procedure. Get help right away if: You have more than a small spotting of blood in your stool. You have large blood clots in  your stool. You have swelling of your abdomen. You have nausea or vomiting. You have a fever. You have increasing pain in your abdomen that is not relieved with medicine. These symptoms may be an emergency. Get help right away. Call 911. Do not wait to see if the symptoms will go away. Do not drive yourself to the hospital. Summary After the procedure, it is common to have a small amount of blood in your stool. You may also have mild cramping and bloating of your abdomen. If you were given a sedative during the procedure, it can affect you for several hours. Do not drive or operate machinery until your health care provider says that it is safe. Get help right away if you have a lot of blood in your stool, nausea or vomiting, a fever, or increased pain in your abdomen. This information is not intended to replace advice given to you by your health care provider. Make sure you discuss any questions you have with your health care provider. Document Revised: 06/24/2021 Document Reviewed: 06/24/2021 Elsevier Patient Education  [place] After This sheet gives you information about how to care for yourself after your procedure. Your health care provider may also give you more specific instructions. If you have problems or questions, contact your health care provider. What can I expect after the procedure? After the procedure, it is common to have: Tiredness. Forgetfulness about what happened after the procedure. Impaired judgment for important decisions. Nausea or vomiting. Some difficulty with balance. Follow these instructions at home: For the time period you were told by your health care provider:     Rest as needed. Do not participate in activities where you could fall or become injured. Do not drive or use machinery. Do not drink alcohol. Do not take sleeping pills or medicines that cause drowsiness. Do not make important decisions or sign  legal documents. Do not take care of children on your own. Eating and drinking Follow the diet that is recommended by your health care provider. Drink enough fluid to keep your urine pale yellow. If you vomit: Drink water, juice, or soup when you can drink without vomiting. Make sure you have little or no nausea before eating solid foods. General instructions Have a responsible adult stay with you for the time you are told. It is important to have someone help care for you until you are awake and alert. Take over-the-counter and prescription medicines only as told by your health care provider. If you have sleep apnea, surgery and certain medicines can increase your risk for breathing problems. Follow instructions from your health care provider about wearing your sleep device: Anytime you are sleeping, including during daytime naps. While taking  prescription pain medicines, sleeping medicines, or medicines that make you drowsy. Avoid smoking. Keep all follow-up visits as told by your health care provider. This is important. Contact a health care provider if: You keep feeling nauseous or you keep vomiting. You feel light-headed. You are still sleepy or having trouble with balance after 24 hours. You develop a rash. You have a fever. You have redness or swelling around the IV site. Get help right away if: You have trouble breathing. You have new-onset confusion at home. Summary For several hours after your procedure, you may feel tired. You may also be forgetful and have poor judgment. Have a responsible adult stay with you for the time you are told. It is important to have someone help care for you until you are awake and alert. Rest as told. Do not drive or operate machinery. Do not drink alcohol or take sleeping pills. Get help right away if you have trouble breathing, or if you suddenly become confused. This information is not intended to replace advice given to you by your health  care provider. Make sure you discuss any questions you have with your health care provider. Document Revised: 10/06/2021 Document Reviewed: 10/04/2019 Elsevier Patient Education  [place].

## 2022-05-21 ENCOUNTER — Encounter (HOSPITAL_COMMUNITY)
Admission: RE | Admit: 2022-05-21 | Discharge: 2022-05-21 | Disposition: A | Discharge status: Home / Self Care | Payer: Medicare PPO | Source: Ambulatory Visit | Attending: Internal Medicine | Admitting: Internal Medicine

## 2022-05-21 ENCOUNTER — Encounter (HOSPITAL_COMMUNITY): Payer: Self-pay

## 2022-05-21 DIAGNOSIS — N1831 Chronic kidney disease, stage 3a: Secondary | ICD-10-CM | POA: Diagnosis not present

## 2022-05-21 DIAGNOSIS — D631 Anemia in chronic kidney disease: Secondary | ICD-10-CM | POA: Insufficient documentation

## 2022-05-21 DIAGNOSIS — D649 Anemia, unspecified: Secondary | ICD-10-CM

## 2022-05-21 DIAGNOSIS — Z01812 Encounter for preprocedural laboratory examination: Secondary | ICD-10-CM | POA: Insufficient documentation

## 2022-05-21 LAB — CBC WITH DIFFERENTIAL/PLATELET
Abs Immature Granulocytes: 0.02 10*3/uL (ref 0.00–0.07)
Basophils Absolute: 0 10*3/uL (ref 0.0–0.1)
Basophils Relative: 1 %
Eosinophils Absolute: 0.1 10*3/uL (ref 0.0–0.5)
Eosinophils Relative: 3 %
HCT: 37.5 % (ref 36.0–46.0)
Hemoglobin: 11.1 g/dL — ABNORMAL LOW (ref 12.0–15.0)
Immature Granulocytes: 0 %
Lymphocytes Relative: 19 %
Lymphs Abs: 0.9 10*3/uL (ref 0.7–4.0)
MCH: 25.8 pg — ABNORMAL LOW (ref 26.0–34.0)
MCHC: 29.6 g/dL — ABNORMAL LOW (ref 30.0–36.0)
MCV: 87.2 fL (ref 80.0–100.0)
Monocytes Absolute: 0.4 10*3/uL (ref 0.1–1.0)
Monocytes Relative: 8 %
Neutro Abs: 3.4 10*3/uL (ref 1.7–7.7)
Neutrophils Relative %: 69 %
Platelets: 229 10*3/uL (ref 150–400)
RBC: 4.3 MIL/uL (ref 3.87–5.11)
RDW: 14.9 % (ref 11.5–15.5)
WBC: 4.8 10*3/uL (ref 4.0–10.5)
nRBC: 0 % (ref 0.0–0.2)

## 2022-05-21 LAB — BASIC METABOLIC PANEL
Anion gap: 8 (ref 5–15)
BUN: 26 mg/dL — ABNORMAL HIGH (ref 8–23)
CO2: 23 mmol/L (ref 22–32)
Calcium: 9.2 mg/dL (ref 8.9–10.3)
Chloride: 109 mmol/L (ref 98–111)
Creatinine, Ser: 1.05 mg/dL — ABNORMAL HIGH (ref 0.44–1.00)
GFR, Estimated: 54 mL/min — ABNORMAL LOW (ref >60)
Glucose, Bld: 74 mg/dL (ref 70–99)
Potassium: 3.8 mmol/L (ref 3.5–5.1)
Sodium: 140 mmol/L (ref 135–145)

## 2022-05-24 DIAGNOSIS — B351 Tinea unguium: Secondary | ICD-10-CM | POA: Diagnosis not present

## 2022-05-24 DIAGNOSIS — I739 Peripheral vascular disease, unspecified: Secondary | ICD-10-CM | POA: Diagnosis not present

## 2022-05-24 DIAGNOSIS — M2042 Other hammer toe(s) (acquired), left foot: Secondary | ICD-10-CM | POA: Diagnosis not present

## 2022-05-24 DIAGNOSIS — M2041 Other hammer toe(s) (acquired), right foot: Secondary | ICD-10-CM | POA: Diagnosis not present

## 2022-05-26 ENCOUNTER — Encounter (HOSPITAL_COMMUNITY): Admission: RE | Payer: Self-pay | Source: Home / Self Care

## 2022-05-26 ENCOUNTER — Telehealth: Payer: Self-pay | Admitting: *Deleted

## 2022-05-26 ENCOUNTER — Ambulatory Visit (HOSPITAL_COMMUNITY): Admission: RE | Admit: 2022-05-26 | Payer: Medicare PPO | Source: Home / Self Care | Admitting: Internal Medicine

## 2022-05-26 DIAGNOSIS — D649 Anemia, unspecified: Secondary | ICD-10-CM

## 2022-05-26 DIAGNOSIS — N1831 Chronic kidney disease, stage 3a: Secondary | ICD-10-CM

## 2022-05-26 SURGERY — COLONOSCOPY WITH PROPOFOL
Anesthesia: Monitor Anesthesia Care

## 2022-05-26 NOTE — Telephone Encounter (Signed)
Received call from Endo and AP nursing that patient refused to do procedure and refused to drink any of the prep. The procedure was cancelled for today. FYI to Dr. Gala Romney

## 2022-06-03 ENCOUNTER — Non-Acute Institutional Stay (SKILLED_NURSING_FACILITY): Payer: Medicare PPO | Admitting: Adult Health

## 2022-06-03 ENCOUNTER — Encounter: Payer: Self-pay | Admitting: Adult Health

## 2022-06-03 DIAGNOSIS — Z0000 Encounter for general adult medical examination without abnormal findings: Secondary | ICD-10-CM | POA: Diagnosis not present

## 2022-06-03 NOTE — Patient Instructions (Signed)
  [name] , Thank you for taking time to come for your Medicare Wellness Visit. I appreciate your ongoing commitment to your health goals. Please review the following plan we discussed and let me know if I can assist you in the future.   These are the goals we discussed:  Goals      Absence of Fall and Fall-Related Injury     Evidence-based guidance:  Assess fall risk using a validated tool when available. Consider balance and gait impairment, muscle weakness, diminished vision or hearing, environmental hazards, presence of urinary or bowel urgency and/or incontinence.  Communicate fall injury risk to interprofessional healthcare team.  Develop a fall prevention plan with the patient and family.  Promote use of personal vision and auditory aids.  Promote reorientation, appropriate sensory stimulation, and routines to decrease risk of fall when changes in mental status are present.  Assess assistance level required for safe and effective self-care; consider referral for home care.  Encourage physical activity, such as performance of self-care at highest level of ability, strength and balance exercise program, and provision of appropriate assistive devices; refer to rehabilitation therapy.  Refer to community-based fall prevention program where available.  If fall occurs, determine the cause and revise fall injury prevention plan.  Regularly review medication contribution to fall risk; consider risk related to polypharmacy and age.  Refer to pharmacist for consultation when concerns about medications are revealed.  Balance adequate pain management with potential for oversedation.  Provide guidance related to environmental modifications.  Consider supplementation with Vitamin D.   Notes:      Exercise 3x per week (30 min per time)     Ambulate daily in hallway      Follow up with Primary Care Provider     General - Client will not be readmitted within 30 days ([facility])        This is  a list of the screening recommended for you and due dates:  Health Maintenance  Topic Date Due   COVID-19 Vaccine (6 - Moderna series) 06/01/2022   Flu Shot  06/15/2022   Tetanus Vaccine  09/17/2031   Pneumonia Vaccine  Completed   DEXA scan (bone density measurement)  Completed   Zoster (Shingles) Vaccine  Completed   HPV Vaccine  Aged Out   Colon Cancer Screening  Discontinued

## 2022-06-03 NOTE — Progress Notes (Signed)
Subjective:   [name] is a 81 y.o. female who presents for Medicare Annual (Subsequent) preventive examination.  Review of Systems    Review of Systems  Constitutional:  Negative for malaise/fatigue.  Respiratory:  Negative for cough and shortness of breath.   Cardiovascular:  Negative for chest pain, palpitations and leg swelling.  Gastrointestinal:  Negative for abdominal pain, constipation and heartburn.  Musculoskeletal:  Negative for back pain, joint pain and myalgias.  Skin: Negative.   Neurological:  Negative for dizziness.  Psychiatric/Behavioral:  The patient is not nervous/anxious.     Cardiac Risk Factors include: advanced age (>34mn, >>31women);diabetes mellitus;dyslipidemia;sedentary lifestyle     Objective:    Today's Vitals   06/03/22 1014 06/03/22 1532  BP: (!) 124/57   Pulse: 75   Resp: (!) 22   Temp: (!) 97.4 F (36.3 C)   SpO2: 97%   Weight: 162 lb 3.2 oz (73.6 kg)   Height: '6\' 1"'$  (1.854 m)   PainSc:  0-No pain   Body mass index is 21.4 kg/m.     06/03/2022   10:19 AM 05/21/2022   11:42 AM 04/06/2022   11:09 AM 03/12/2022   11:31 AM 02/11/2022    8:56 AM 02/04/2022   12:09 PM 01/11/2022   10:02 AM  Advanced Directives  Does Patient Have a Medical Advance Directive? Yes Yes Yes Yes Yes Yes Yes  Type of Advance Directive Out of facility DNR (pink MOST or yellow form)  Out of facility DNR (pink MOST or yellow form) Out of facility DNR (pink MOST or yellow form) Out of facility DNR (pink MOST or yellow form) Out of facility DNR (pink MOST or yellow form) Out of facility DNR (pink MOST or yellow form)  Does patient want to make changes to medical advance directive? No - Patient declined No - Patient declined No - Patient declined No - Patient declined No - Patient declined No - Patient declined No - Patient declined  Pre-existing out of facility DNR order (yellow form or pink MOST form) Yellow form placed in chart (order not valid for inpatient  use)  Yellow form placed in chart (order not valid for inpatient use) Yellow form placed in chart (order not valid for inpatient use) Yellow form placed in chart (order not valid for inpatient use) Yellow form placed in chart (order not valid for inpatient use) Yellow form placed in chart (order not valid for inpatient use)    Current Medications (verified) Outpatient Encounter Medications as of 06/03/2022  Medication Sig   acetaminophen (TYLENOL) 325 MG tablet Take 650 mg by mouth every 8 (eight) hours as needed for moderate pain.   albuterol (VENTOLIN HFA) 108 (90 Base) MCG/ACT inhaler Inhale 2 puffs into the lungs every 6 (six) hours as needed for wheezing or shortness of breath.   alendronate (FOSAMAX) 70 MG tablet Take 70 mg by mouth every Sunday. Take on an empty stomach at least 30 mns prior to food. Sit upright x 335m after taking meds.   atorvastatin (LIPITOR) 10 MG tablet TAKE 1 TABLET EVERY DAY (Patient taking differently: Take 10 mg by mouth every evening.)   Calcium Carb-Cholecalciferol (CALCIUM 600+D) 600-10 MG-MCG TABS Take 1 tablet by mouth daily.   cholecalciferol (VITAMIN D3) 25 MCG (1000 UNIT) tablet Take 1,000 Units by mouth daily.   donepezil (ARICEPT) 10 MG tablet TAKE 1 TABLET AT BEDTIME   Lactase (LACTAID FAST ACT) 9000 units TABS Take 9,000 Units by mouth as needed (  prior to dairy products).   loperamide (IMODIUM A-D) 2 MG tablet Take 2-4 mg by mouth See admin instructions. Take 4 mg after first loose stool and then 2 mg after each loose stool, max 8 tabs per 24 hours   loratadine (CLARITIN) 10 MG tablet Take 10 mg by mouth daily.   mirabegron ER (MYRBETRIQ) 50 MG TB24 tablet Take 50 mg by mouth daily.   NON FORMULARY Diet: Regular diet with chopped meats and gravy to all meats.   Nutritional Supplements (ENSURE CLEAR) LIQD Take by mouth. Twice A Day Between Meals due to weight loss   omeprazole (PRILOSEC) 40 MG capsule TAKE 1 CAPSULE EVERY DAY   OXYGEN Inhale 2 L into  the lungs continuous.   potassium chloride SA (KLOR-CON M) 20 MEQ tablet Take 20 mEq by mouth daily.   STRIVERDI RESPIMAT 2.5 MCG/ACT AERS INHALE 2 PUFFS ONE TIME DAILY AT THE SAME TIME   verapamil (CALAN-SR) 240 MG CR tablet TAKE 1 TABLET AT BEDTIME   polyethylene glycol-electrolytes (TRILYTE) 420 g solution Take 4,000 mLs by mouth as directed.   No facility-administered encounter medications on file as of 06/03/2022.    Allergies (verified) Fenofibrate, Pravastatin sodium, Sulfonamide derivatives, and Ciprofloxacin   History: Past Medical History:  Diagnosis Date   Abnormality of gait 06/06/2014   Allergic rhinitis    Anemia    Ankle fracture, right    Anxiety    Arm fracture, left    Arthritis    abnormal gait    COPD (chronic obstructive pulmonary disease) (HCC)    chronic CO2 retention, decreased DLCO    Cystic acne    adult    Degenerative disc disease, cervical    syrinx C3-7   Degenerative disc disease, lumbar    L5 nerve impingement    Dementia (HCC)    Depression    DNR (do not resuscitate)    Fracture of right proximal fibula 07/13/18 09/05/2018   GERD (gastroesophageal reflux disease)    Heme positive stool 10/10/2017   History of colon surgery    right hemicolectomy for high-grade adenomas in right colon 2013   Hyperglycemia    Hypertension    Low back pain    Mediastinal lymphadenopathy 1/9   resolving    Memory difficulty 09/20/2014   mild dementia   Onychomycosis    OSA on CPAP    Sleep apnea    stop bang score 6   Past Surgical History:  Procedure Laterality Date   ABDOMINAL HYSTERECTOMY  1976   secondary to bleeding    APPENDECTOMY     COLON RESECTION  08/18/2012   Procedure: HAND ASSISTED LAPAROSCOPIC COLON RESECTION;  Surgeon: Jamesetta So, MD;  Location: [facility];  Service: General;  Laterality: N/A;   COLONOSCOPY  07/20/2012   Dr. Gala Romney: multiple colonic polyps with large polyps on right side, s/p saline-assisted debulking piecemeal  polypectomy and ablation. Not all removed. Path with tubulovillous adenomas, high grade dysplasia.    COLONOSCOPY N/A 03/09/2013   GGY:IRSWNIO polyps-tubular adenomas. S/p right hemicolectomy. next tcs 02/2018   epidural steroids     ESOPHAGOGASTRODUODENOSCOPY (EGD) WITH ESOPHAGEAL DILATION N/A 02/06/2014   Procedure: ESOPHAGOGASTRODUODENOSCOPY (EGD) WITH ESOPHAGEAL DILATION;  Surgeon: Daneil Dolin, MD;  Location: [facility];  Service: Endoscopy;  Laterality: N/A;  9:30   OOPHORECTOMY  1976   ORIF ANKLE FRACTURE  01/18/2012   Procedure: OPEN REDUCTION INTERNAL FIXATION (ORIF) ANKLE FRACTURE;  Surgeon: Arther Abbott, MD;  Location: [facility];  Service: Orthopedics;  Laterality: Left;   ORIF ANKLE FRACTURE Right 01/13/2016   Procedure: OPEN REDUCTION INTERNAL FIXATION (ORIF) ANKLE FRACTURE;  Surgeon: Carole Civil, MD;  Location: [facility];  Service: Orthopedics;  Laterality: Right;   Family History  Problem Relation Age of Onset   Stomach cancer Father    Cancer Father    Cancer Mother        brain    Breast cancer Mother    Arthritis Other    Social History   Socioeconomic History   Marital status: Widowed    Spouse name: Not on file   Number of children: 4   Years of education: college 1   Highest education level: Not on file  Occupational History   Occupation: employed in the tobacco industry     Employer: RETIRED   Occupation: retired  Tobacco Use   Smoking status: Former    Packs/day: 1.50    Years: 40.00    Total pack years: 60.00    Types: Cigarettes    Quit date: 06/21/2006    Years since quitting: 15.9   Smokeless tobacco: Never  Vaping Use   Vaping Use: Never used  Substance and Sexual Activity   Alcohol use: No   Drug use: No   Sexual activity: Not Currently  Other Topics Concern   Not on file  Social History Narrative   [facility] resident of [name]    Social Determinants of Health   Financial Resource Strain: [place]  (12/02/2020)   Overall Financial  Resource Strain (CARDIA)    Difficulty of Paying Living Expenses: Not hard at all  Food Insecurity: No Food Insecurity (12/02/2020)   Hunger Vital Sign    Worried About Running Out of Food in the Last Year: Never true    [place] in the Last Year: Never true  Transportation Needs: No Transportation Needs (12/02/2020)   PRAPARE - Hydrologist (Medical): No    Lack of Transportation (Non-Medical): No  Physical Activity: Inactive (12/02/2020)   Exercise Vital Sign    Days of Exercise per Week: 0 days    Minutes of Exercise per Session: 0 min  Stress: No Stress Concern Present (12/02/2020)   [place]    Feeling of Stress : Not at all  Social Connections: Socially Isolated (12/02/2020)   Social Connection and Isolation Panel [NHANES]    Frequency of Communication with Friends and Family: Three times a week    Frequency of Social Gatherings with Friends and Family: Once a week    Attends Religious Services: Never    Marine scientist or Organizations: No    Attends Archivist Meetings: Never    Marital Status: Widowed    Tobacco Counseling Counseling given: Not Answered   Clinical Intake:  Pre-visit preparation completed: Yes  Pain : No/denies pain Pain Score: 0-No pain     BMI - recorded: 21.4 Nutritional Status: BMI of 19-24  Normal Nutritional Risks: Unintentional weight loss Diabetes: Yes CBG done?: Yes CBG resulted in Enter/ Edit results?: No Did pt. bring in CBG monitor from home?: No  How often do you need to have someone help you when you read instructions, pamphlets, or other written materials from your doctor or pharmacy?: 5 - Always  Diabetic?no  Interpreter Needed?: No      Activities of Daily Living    06/03/2022    3:30 PM  05/21/2022   11:41 AM  In your present state of health, do you have any difficulty performing the following  activities:  Hearing? 0 0  Vision? 0 0  Difficulty concentrating or making decisions? 1 1  Walking or climbing stairs? 1 1  Dressing or bathing? 1 1  Doing errands, shopping? 1   Preparing Food and eating ? Y   Using the Toilet? Y   In the past six months, have you accidently leaked urine? Y   Do you have problems with loss of bowel control? Y   Managing your Medications? Y   Managing your Finances? Y   Housekeeping or managing your Housekeeping? Y     Patient Care Team: Gerlene Fee, NP as PCP - General (Geriatric Medicine) Josue Hector, MD as Attending Physician (Cardiology) Kathrynn Ducking, MD (Inactive) as Consulting Physician (Neurology) Sinda Du, MD as Consulting Physician (Pulmonary Disease) Carole Civil, MD as Consulting Physician (Orthopedic Surgery)  Indicate any recent Medical Services you may have received from other than [facility] providers in the past year (date may be approximate).     Assessment:   This is a routine wellness examination for Stephie.  Hearing/Vision screen No results found.  Dietary issues and exercise activities discussed: Current Exercise Habits: The patient does not participate in regular exercise at present, Exercise limited by: None identified   Goals Addressed             This Visit's Progress    Absence of Fall and Fall-Related Injury       Evidence-based guidance:  Assess fall risk using a validated tool when available. Consider balance and gait impairment, muscle weakness, diminished vision or hearing, environmental hazards, presence of urinary or bowel urgency and/or incontinence.  Communicate fall injury risk to interprofessional healthcare team.  Develop a fall prevention plan with the patient and family.  Promote use of personal vision and auditory aids.  Promote reorientation, appropriate sensory stimulation, and routines to decrease risk of fall when changes in mental status are present.  Assess  assistance level required for safe and effective self-care; consider referral for home care.  Encourage physical activity, such as performance of self-care at highest level of ability, strength and balance exercise program, and provision of appropriate assistive devices; refer to rehabilitation therapy.  Refer to community-based fall prevention program where available.  If fall occurs, determine the cause and revise fall injury prevention plan.  Regularly review medication contribution to fall risk; consider risk related to polypharmacy and age.  Refer to pharmacist for consultation when concerns about medications are revealed.  Balance adequate pain management with potential for oversedation.  Provide guidance related to environmental modifications.  Consider supplementation with Vitamin D.   Notes:      Exercise 3x per week (30 min per time)   Not on track    Ambulate daily in hallway      Follow up with Primary Care Provider   On track    General - Client will not be readmitted within 30 days (C-SNP)   On track      Depression Screen    06/03/2022    3:30 PM 02/16/2022   12:45 PM 12/07/2021    1:59 PM 06/02/2021   11:08 AM 01/06/2021   10:57 AM 12/02/2020    2:24 PM 07/14/2020   10:46 AM  PHQ 2/9 Scores  PHQ - 2 Score 0 0 0 0 0 0 0  PHQ- 9 Score  0 0 0  Fall Risk    06/03/2022    3:29 PM 02/16/2022   12:45 PM 12/07/2021    2:00 PM 06/02/2021   11:07 AM 01/06/2021   10:57 AM  Fall Risk   Falls in the past year? '1 1 1 '$ 0 1  Number falls in past yr: '1 1 1 '$ 0 1  Injury with Fall? 0 0 0 0 1  Risk for fall due to : History of fall(s);Impaired balance/gait;Impaired mobility History of fall(s);Impaired balance/gait;Impaired mobility History of fall(s);Impaired balance/gait;Impaired mobility Impaired balance/gait Impaired mobility;Impaired balance/gait  Follow up     Falls evaluation completed    FALL RISK PREVENTION PERTAINING TO THE HOME:  Any stairs in or around the home? Yes   If so, are there any without handrails? Yes  Home free of loose throw rugs in walkways, pet beds, electrical cords, etc? No  Adequate lighting in your home to reduce risk of falls? Yes   ASSISTIVE DEVICES UTILIZED TO PREVENT FALLS:  Life alert? No  Use of a cane, walker or w/c? Yes  Grab bars in the bathroom? Yes  Shower chair or bench in shower? Yes  Elevated toilet seat or a handicapped toilet? Yes   TIMED UP AND GO:  Was the test performed? No .  Nonambulatory     Cognitive Function:    06/03/2022    3:30 PM 06/02/2021   11:13 AM 03/10/2017   10:10 AM 04/21/2016   10:21 AM 10/21/2015   10:00 AM  MMSE - Mini Mental State Exam  Orientation to time '5 1 3 5 3  '$ Orientation to Place 0 0 '5 4 3  '$ Registration '3 3 3 3 3  '$ Attention/ Calculation 0 0 '4 4 2  '$ Recall '1 2 1 2 '$ 0  Language- name 2 objects '2 2 2 2 2  '$ Language- repeat '1 1 1 1 1  '$ Language- follow 3 step command '1 3 3 3 3  '$ Language- read & follow direction 0 '1 1 1 1  '$ Write a sentence 0 '1 1 1 1  '$ Copy design 0 0 '1 1 1  '$ Total score '13 14 25 27 20        '$ 06/03/2022    3:31 PM 06/02/2021   11:14 AM 02/27/2019    2:30 PM 02/20/2018    2:04 PM 11/10/2016   11:28 AM  6CIT Screen  What Year? 0 points 0 points 0 points 0 points 0 points  What month? 3 points 3 points 0 points 0 points 0 points  What time? 0 points 0 points 0 points 0 points 0 points  Count back from 20 4 points 4 points 4 points 0 points 0 points  Months in reverse 4 points 4 points 2 points 0 points 0 points  Repeat phrase 8 points 10 points 10 points 4 points 0 points  Total Score 19 points 21 points 16 points 4 points 0 points    Immunizations Immunization History  Administered Date(s) Administered   Fluad Quad(high Dose 65+) 07/10/2019, 08/22/2020   Influenza Split 08/25/2011, 07/25/2012   Influenza Whole 09/12/2006, 08/25/2007, 08/01/2008, 08/20/2009, 08/20/2010   Influenza, High Dose Seasonal PF 12/13/2018   Influenza,inj,Quad PF,6+ Mos 08/21/2013,  09/04/2014, 08/13/2015, 07/08/2016, 09/23/2017, 08/19/2021   Moderna SARS-COV2 Booster Vaccination 04/06/2022   Moderna Sars-Covid-2 Vaccination 01/10/2020, 02/08/2020, 09/08/2021   PFIZER Comirnaty(Gray Top)Covid-19 Tri-Sucrose Vaccine 09/23/2020, 03/05/2021   PNEUMOCOCCAL CONJUGATE-20 04/29/2022   Pneumococcal Conjugate-13 06/20/2014   Pneumococcal Polysaccharide-23 09/12/2006, 08/20/2010, 04/16/2011   Td 08/20/2010   Tdap  09/16/2021   Zoster Recombinat (Shingrix) 07/14/2021, 09/08/2021, 10/16/2021   Zoster, Live 12/26/2013    TDAP status: Up to date  Flu Vaccine status: Up to date  Pneumococcal vaccine status: Up to date  Covid-19 vaccine status: Completed vaccines  Qualifies for Shingles Vaccine? Yes   Zostavax completed Yes   Shingrix Completed?: Yes  Screening Tests Health Maintenance  Topic Date Due   COVID-19 Vaccine (6 - Moderna series) 06/01/2022   INFLUENZA VACCINE  06/15/2022   TETANUS/TDAP  09/17/2031   Pneumonia Vaccine 41+ Years old  Completed   DEXA SCAN  Completed   Zoster Vaccines- Shingrix  Completed   HPV VACCINES  Aged Out   COLONOSCOPY (Pts 45-64yr Insurance coverage will need to be confirmed)  Discontinued    Health Maintenance  Health Maintenance Due  Topic Date Due   COVID-19 Vaccine (6 - Moderna series) 06/01/2022    Colorectal cancer screening: No longer required.   Mammogram status: No longer required due to age.  Bone Density status: Completed  . Results reflect: Bone density results: OSTEOPENIA. Repeat every   years.  Lung Cancer Screening: (Low Dose CT Chest recommended if Age 81-80years, 30 pack-year currently smoking OR have quit w/in 15years.) does not qualify.   Lung Cancer Screening Referral: n/a  Additional Screening:  Hepatitis C Screening: does qualify; Completed yes   Vision Screening: Recommended annual ophthalmology exams for early detection of glaucoma and other disorders of the eye. Is the patient up to date  with their annual eye exam?  Yes  Who is the provider or what is the name of the office in which the patient attends annual eye exams?  If pt is not established with a provider, would they like to be referred to a provider to establish care?    .   Dental Screening: Recommended annual dental exams for proper oral hygiene  Community Resource Referral / Chronic Care Management: CRR required this visit?  No   CCM required this visit?  No      Plan:     I have personally reviewed and noted the following in the patient's chart:   Medical and social history Use of alcohol, tobacco or illicit drugs  Current medications and supplements including opioid prescriptions.  Functional ability and status Nutritional status Physical activity Advanced directives List of other physicians Hospitalizations, surgeries, and ER visits in previous 12 months Vitals Screenings to include cognitive, depression, and falls Referrals and appointments  In addition, I have reviewed and discussed with patient certain preventive protocols, quality metrics, and best practice recommendations. A written personalized care plan for preventive services as well as general preventive health recommendations were provided to patient.     DGerlene Fee NP   06/03/2022   Nurse Notes: exam completed at this facility by myself.

## 2022-06-08 ENCOUNTER — Non-Acute Institutional Stay (SKILLED_NURSING_FACILITY): Payer: Medicare PPO | Admitting: Adult Health

## 2022-06-08 ENCOUNTER — Encounter: Payer: Self-pay | Admitting: Adult Health

## 2022-06-08 DIAGNOSIS — J9612 Chronic respiratory failure with hypercapnia: Secondary | ICD-10-CM

## 2022-06-08 DIAGNOSIS — J449 Chronic obstructive pulmonary disease, unspecified: Secondary | ICD-10-CM

## 2022-06-08 DIAGNOSIS — Z66 Do not resuscitate: Secondary | ICD-10-CM | POA: Diagnosis not present

## 2022-06-08 DIAGNOSIS — R0902 Hypoxemia: Secondary | ICD-10-CM

## 2022-06-08 DIAGNOSIS — I10 Essential (primary) hypertension: Secondary | ICD-10-CM

## 2022-06-08 DIAGNOSIS — J9611 Chronic respiratory failure with hypoxia: Secondary | ICD-10-CM

## 2022-06-08 DIAGNOSIS — K219 Gastro-esophageal reflux disease without esophagitis: Secondary | ICD-10-CM | POA: Diagnosis not present

## 2022-06-08 NOTE — Progress Notes (Signed)
Location:  [place] Room Number: 149-W Place of Service:  SNF (31)   CODE STATUS: DNR  Allergies  Allergen Reactions   Fenofibrate Nausea And Vomiting   Pravastatin Sodium Nausea And Vomiting   Sulfonamide Derivatives Other (See Comments)    unknown   Ciprofloxacin Rash    Chief Complaint  Patient presents with   Medical Management of Chronic Issues                                        Urinary  frequency:    Chronic respiratory failure with hypoxia and hypercapnia COPD;    Essential hypertension Esophageal reflux disease without esophagitis    HPI:  She is a 81 year old long term resident of this facility being seen for the management of her chronic illnesses:     Urinary  frequency:    Chronic respiratory failure with hypoxia and hypercapnia COPD;    Essential hypertension Esophageal reflux disease without esophagitis. She has had numerous falls in the past 1-2 weeks. I have had pharmacy review her medications with me; the only medication which could be contributing to her falls could be verapamil. Her myrbetriq could also be contributing to falls; but prior to being on this medication she had falls attempting to go to the bath room.   Past Medical History:  Diagnosis Date   Abnormality of gait 06/06/2014   Allergic rhinitis    Anemia    Ankle fracture, right    Anxiety    Arm fracture, left    Arthritis    abnormal gait    COPD (chronic obstructive pulmonary disease) (HCC)    chronic CO2 retention, decreased DLCO    Cystic acne    adult    Degenerative disc disease, cervical    syrinx C3-7   Degenerative disc disease, lumbar    L5 nerve impingement    Dementia (HCC)    Depression    DNR (do not resuscitate)    Fracture of right proximal fibula 07/13/18 09/05/2018   GERD (gastroesophageal reflux disease)    Heme positive stool 10/10/2017   History of colon surgery    right hemicolectomy for high-grade adenomas in right colon 2013    Hyperglycemia    Hypertension    Low back pain    Mediastinal lymphadenopathy 1/9   resolving    Memory difficulty 09/20/2014   mild dementia   Onychomycosis    OSA on CPAP    Sleep apnea    stop bang score 6    Past Surgical History:  Procedure Laterality Date   ABDOMINAL HYSTERECTOMY  1976   secondary to bleeding    APPENDECTOMY     COLON RESECTION  08/18/2012   Procedure: HAND ASSISTED LAPAROSCOPIC COLON RESECTION;  Surgeon: Jamesetta So, MD;  Location: [facility];  Service: General;  Laterality: N/A;   COLONOSCOPY  07/20/2012   Dr. Gala Romney: multiple colonic polyps with large polyps on right side, s/p saline-assisted debulking piecemeal polypectomy and ablation. Not all removed. Path with tubulovillous adenomas, high grade dysplasia.    COLONOSCOPY N/A 03/09/2013   TIW:[id number] polyps-tubular adenomas. S/p right hemicolectomy. next tcs 02/2018   epidural steroids     ESOPHAGOGASTRODUODENOSCOPY (EGD) WITH ESOPHAGEAL DILATION N/A 02/06/2014   Procedure: ESOPHAGOGASTRODUODENOSCOPY (EGD) WITH ESOPHAGEAL DILATION;  Surgeon: Daneil Dolin, MD;  [facility];  Service: Endoscopy;  Laterality:  N/A;  9:30   OOPHORECTOMY  1976   ORIF ANKLE FRACTURE  01/18/2012   Procedure: OPEN REDUCTION INTERNAL FIXATION (ORIF) ANKLE FRACTURE;  Surgeon: Arther Abbott, MD;  Location: [facility];  Service: Orthopedics;  Laterality: Left;   ORIF ANKLE FRACTURE Right 01/13/2016   Procedure: OPEN REDUCTION INTERNAL FIXATION (ORIF) ANKLE FRACTURE;  Surgeon: Carole Civil, MD;  Location: [facility];  Service: Orthopedics;  Laterality: Right;    Social History   Socioeconomic History   Marital status: Widowed    Spouse name: Not on file   Number of children: 4   Years of education: college 1   Highest education level: Not on file  Occupational History   Occupation: employed in the tobacco industry     Employer: RETIRED   Occupation: retired  Tobacco Use   Smoking status: Former    Packs/day: 1.50     Years: 40.00    Total pack years: 60.00    Types: Cigarettes    Quit date: 06/21/2006    Years since quitting: 15.9   Smokeless tobacco: Never  Vaping Use   Vaping Use: Never used  Substance and Sexual Activity   Alcohol use: No   Drug use: No   Sexual activity: Not Currently  Other Topics Concern   Not on file  Social History Narrative   Long term resident of [facility]    Social Determinants of Health   Financial Resource Strain: [place]  (12/02/2020)   Overall Financial Resource Strain (CARDIA)    Difficulty of Paying Living Expenses: Not hard at all  Food Insecurity: No Food Insecurity (12/02/2020)   Hunger Vital Sign    Worried About Running Out of Food in the Last Year: Never true    [place] in the Last Year: Never true  Transportation Needs: No Transportation Needs (12/02/2020)   PRAPARE - Hydrologist (Medical): No    Lack of Transportation (Non-Medical): No  Physical Activity: Inactive (12/02/2020)   Exercise Vital Sign    Days of Exercise per Week: 0 days    Minutes of Exercise per Session: 0 min  Stress: No Stress Concern Present (12/02/2020)   [place]    Feeling of Stress : Not at all  Social Connections: Socially Isolated (12/02/2020)   Social Connection and Isolation Panel [NHANES]    Frequency of Communication with Friends and Family: Three times a week    Frequency of Social Gatherings with Friends and Family: Once a week    Attends Religious Services: Never    Marine scientist or Organizations: No    Attends Archivist Meetings: Never    Marital Status: Widowed  Intimate Partner Violence: Not At Risk (12/02/2020)   Humiliation, Afraid, Rape, and Kick questionnaire    Fear of Current or Ex-Partner: No    Emotionally Abused: No    Physically Abused: No    Sexually Abused: No   Family History  Problem Relation Age of Onset   Stomach cancer  Father    Cancer Father    Cancer Mother        brain    Breast cancer Mother    Arthritis Other       VITAL SIGNS BP (!) 142/74   Pulse 72   Temp 97.8 F (36.6 C)   Resp 18   Ht '6\' 1"'$  (1.854 m)   Wt 162 lb (73.5 kg)  SpO2 97%   BMI 21.37 kg/m   Outpatient Encounter Medications as of 06/08/2022  Medication Sig   acetaminophen (TYLENOL) 325 MG tablet Take 650 mg by mouth every 8 (eight) hours as needed for moderate pain.   albuterol (VENTOLIN HFA) 108 (90 Base) MCG/ACT inhaler Inhale 2 puffs into the lungs every 6 (six) hours as needed for wheezing or shortness of breath.   alendronate (FOSAMAX) 70 MG tablet Take 70 mg by mouth every Sunday. Take on an empty stomach at least 30 mns prior to food. Sit upright x 41ms after taking meds.   atorvastatin (LIPITOR) 10 MG tablet TAKE 1 TABLET EVERY DAY   Calcium Carb-Cholecalciferol (CALCIUM 600+D) 600-10 MG-MCG TABS Take 1 tablet by mouth daily.   cholecalciferol (VITAMIN D3) 25 MCG (1000 UNIT) tablet Take 1,000 Units by mouth daily.   donepezil (ARICEPT) 10 MG tablet TAKE 1 TABLET AT BEDTIME   Lactase (LACTAID FAST ACT) 9000 units TABS Take 9,000 Units by mouth as needed (prior to dairy products).   loperamide (IMODIUM A-D) 2 MG tablet Take 2-4 mg by mouth See admin instructions. Take 4 mg after first loose stool and then 2 mg after each loose stool, max 8 tabs per 24 hours   loratadine (CLARITIN) 10 MG tablet Take 10 mg by mouth daily.   mirabegron ER (MYRBETRIQ) 50 MG TB24 tablet Take 50 mg by mouth daily.   NON FORMULARY Diet: Regular diet with chopped meats and gravy to all meats.   Nutritional Supplements (ENSURE CLEAR) LIQD Take 1 Can by mouth 2 (two) times daily between meals. Due to weight loss   omeprazole (PRILOSEC) 40 MG capsule TAKE 1 CAPSULE EVERY DAY   OXYGEN Inhale 2 L into the lungs continuous.   potassium chloride SA (KLOR-CON M) 20 MEQ tablet Take 20 mEq by mouth daily.   STRIVERDI RESPIMAT 2.5 MCG/ACT AERS INHALE  2 PUFFS ONE TIME DAILY AT THE SAME TIME   verapamil (CALAN-SR) 240 MG CR tablet TAKE 1 TABLET AT BEDTIME   [DISCONTINUED] polyethylene glycol-electrolytes (TRILYTE) 420 g solution Take 4,000 mLs by mouth as directed.   No facility-administered encounter medications on file as of 06/08/2022.     SIGNIFICANT DIAGNOSTIC EXAMS  LABS REVIEWED PREVIOUS  06-02-21: urine culture: no growth; hep C neg 07-21-21: wbc 4.8; hgb 10.0; hct 34.4; mcv 87.3 plt 224; glucose 138; bun 22; creat 0.87; k+ 3.4; na++ 130; ca 8.5; GFR>60 07-27-21: albumin 3.7; k+ 3.9 09-02-21: glucose 84; bun 18; creat 0.77; k+ 3.7; na++ 139; ca 9.0; GFR>60; liver normal albumin 4.0 tsh 1.075 free t4: 0.93  12-03-21: wbc 3.6; hgb 10.3; hct 33.9; mcv 88.3 plt 220; glucose 87; bun 19; creat 0.92; k+ 4.1; na++ 140; ca 8.9; >60; total protein 6.1 albumin 3.6 01-04-22: wbc 5.8; hgb 10.0; hct 32.9; mcv 89.2 plt 188; glucose 78; bun 0.88; creat 0.88; k+ 3.8; na++ 136; ca 9.2; GFR >60 d-dimer 0.54; CRP 1.6  01-04-22 (ED) wbc10.6; hgb 12.0; hct 40.6; mcv89.8 plt 229; glucose 160; bun 26; creat 1.24; k+ 4.0; na++ 136; ca 9.0; GFR 44; protein 7.4 albumin 4.0 01-08-31: wbc 14.6; hgb 9.5; hct 31.9; mcv 86.4 plt 219; glucose 118; bun 17; creat 0.62; k+ 3.5; na++ 139; ca 8.2; GFR>60 protein 6.6; albumin 3.1; mag 2.0; CRP 12.2; d-dimer 0.77   TODAY  05-21-22: wbc 4.8; hgb 11.; hct 37.5; mcv 87.2 plt 229; glucose 74; bun 26; creat 1.05 ;k+ 3.8; na++ 140; ca 9.2; gfr 54   Review of  Systems  Constitutional:  Negative for malaise/fatigue.  Respiratory:  Negative for cough and shortness of breath.   Cardiovascular:  Negative for chest pain, palpitations and leg swelling.  Gastrointestinal:  Negative for abdominal pain, constipation and heartburn.  Musculoskeletal:  Negative for back pain, joint pain and myalgias.  Skin: Negative.   Neurological:  Negative for dizziness.  Psychiatric/Behavioral:  The patient is not nervous/anxious.    Physical  Exam Constitutional:      General: She is not in acute distress.    Appearance: She is well-developed. She is not diaphoretic.  Neck:     Thyroid: No thyromegaly.  Cardiovascular:     Rate and Rhythm: Normal rate and regular rhythm.     Pulses: Normal pulses.     Heart sounds: Murmur heard.  Pulmonary:     Effort: Pulmonary effort is normal. No respiratory distress.     Breath sounds: Normal breath sounds.  Abdominal:     General: Bowel sounds are normal. There is no distension.     Palpations: Abdomen is soft.     Tenderness: There is no abdominal tenderness.  Musculoskeletal:        General: Normal range of motion.     Cervical back: Neck supple.     Right lower leg: No edema.     Left lower leg: No edema.  Lymphadenopathy:     Cervical: No cervical adenopathy.  Skin:    General: Skin is warm and dry.  Neurological:     Mental Status: She is alert. Mental status is at baseline.  Psychiatric:        Mood and Affect: Mood normal.          ASSESSMENT AND PLAN   TODAY  1. Urinary  frequency: will continue myrbetriq 50 mg daily   2. Chronic respiratory failure with hypoxia and hypercapnia COPD; on 02; will continue albuterol 2 puffs every 4 hours as needed; strivirdi respimat 2.5 mcg 2 puffs twice daily; claritin 10 mg daily   3. Essential hypertension b/p 142/74 will lower verapamil cr to 180 mg daily due to her falls.   4. Esophageal reflux disease without esophagitis: will continue prilosec 40 mg daily   PREVIOUS   5. 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 Generalized osteoarthritis of multiple sites: has tylenol every 8 hours as needed   7. Age related osteoporosis without current pathological fractures: will continue fosamax 70 mg weekly   8. Stage 3a chronic kidney disease: bun 17; creat 0.62 gfr >60  9. Normocytic anemia; hgb 9,5  will monitor  10. Dysphagia oropharyngeal phase: no indications of aspiration present.    11. Vitamin D deficiency: will continue vitamin D 50,000 units weekly will check vitamin D   12. Hyperlipidemia LDL goal <100: LDL 65; will continue lipitor 10 mg daily   13. Low thyroid stimulating hormone (tsh) tsh 1.075 free t4: 0.93 will monitor  14. 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

## 2022-06-10 ENCOUNTER — Other Ambulatory Visit (HOSPITAL_COMMUNITY)
Admission: RE | Admit: 2022-06-10 | Discharge: 2022-06-10 | Disposition: A | Discharge status: Home / Self Care | Payer: Medicare PPO | Source: Skilled Nursing Facility | Attending: Adult Health | Admitting: Adult Health

## 2022-06-10 DIAGNOSIS — E559 Vitamin D deficiency, unspecified: Secondary | ICD-10-CM | POA: Insufficient documentation

## 2022-06-10 DIAGNOSIS — Z13820 Encounter for screening for osteoporosis: Secondary | ICD-10-CM | POA: Diagnosis not present

## 2022-06-10 LAB — VITAMIN D 25 HYDROXY (VIT D DEFICIENCY, FRACTURES): Vit D, 25-Hydroxy: 39.61 ng/mL (ref 30–100)

## 2022-06-11 ENCOUNTER — Non-Acute Institutional Stay (SKILLED_NURSING_FACILITY): Payer: Medicare PPO | Admitting: Adult Health

## 2022-06-11 ENCOUNTER — Encounter: Payer: Self-pay | Admitting: Adult Health

## 2022-06-11 DIAGNOSIS — J9612 Chronic respiratory failure with hypercapnia: Secondary | ICD-10-CM | POA: Diagnosis not present

## 2022-06-11 DIAGNOSIS — J9611 Chronic respiratory failure with hypoxia: Secondary | ICD-10-CM

## 2022-06-11 DIAGNOSIS — I471 Supraventricular tachycardia: Secondary | ICD-10-CM

## 2022-06-11 DIAGNOSIS — F0150 Vascular dementia without behavioral disturbance: Secondary | ICD-10-CM

## 2022-06-11 DIAGNOSIS — F0390 Unspecified dementia without behavioral disturbance: Secondary | ICD-10-CM

## 2022-06-11 NOTE — Progress Notes (Signed)
Location:  [place] Room Number: [place] of Service:  SNF (31) Provider:  Ok Edwards, NP   CODE STATUS: DNR  Allergies  Allergen Reactions   Fenofibrate Nausea And Vomiting   Pravastatin Sodium Nausea And Vomiting   Sulfonamide Derivatives Other (See Comments)    unknown   Ciprofloxacin Rash    Chief Complaint  Patient presents with   Acute Visit    Care plan meeting    HPI:  We have come together for her care plan meeting. BIMS 4/15 mood 0/30. She is nonambulatory has had numerous falls. Her verapamil was lowered. She requires extensive assist with her adls. She is frequently incontinent of bladder and bowel. Dietary: regular diet: restorative dining. 75-100% of meals. Feeds self. Weight is 162 pounds. Therapy: none at this time. She does attend activities at times; poor attention span.  She continues to be followed for her chronic illnesses including:  SVT (supraventricular tachycardia) Chronic respiratory failure with hypoxia and hypercapnia  Major neurocognitive disorder  Vascular dementia without behavioral disturbance:   Past Medical History:  Diagnosis Date   Abnormality of gait 06/06/2014   Allergic rhinitis    Anemia    Ankle fracture, right    Anxiety    Arm fracture, left    Arthritis    abnormal gait    COPD (chronic obstructive pulmonary disease) (HCC)    chronic CO2 retention, decreased DLCO    Cystic acne    adult    Degenerative disc disease, cervical    syrinx C3-7   Degenerative disc disease, lumbar    L5 nerve impingement    Dementia (HCC)    Depression    DNR (do not resuscitate)    Fracture of right proximal fibula 07/13/18 09/05/2018   GERD (gastroesophageal reflux disease)    Heme positive stool 10/10/2017   History of colon surgery    right hemicolectomy for high-grade adenomas in right colon 2013   Hyperglycemia    Hypertension    Low back pain    Mediastinal lymphadenopathy 1/9   resolving    Memory  difficulty 09/20/2014   mild dementia   Onychomycosis    OSA on CPAP    Sleep apnea    stop bang score 6    Past Surgical History:  Procedure Laterality Date   ABDOMINAL HYSTERECTOMY  1976   secondary to bleeding    APPENDECTOMY     COLON RESECTION  08/18/2012   Procedure: HAND ASSISTED LAPAROSCOPIC COLON RESECTION;  Surgeon: Jamesetta So, MD;  Location: [facility];  Service: General;  Laterality: N/A;   COLONOSCOPY  07/20/2012   Dr. Gala Romney: multiple colonic polyps with large polyps on right side, s/p saline-assisted debulking piecemeal polypectomy and ablation. Not all removed. Path with tubulovillous adenomas, high grade dysplasia.    COLONOSCOPY N/A 03/09/2013   [id number] polyps-tubular adenomas. S/p right hemicolectomy. next tcs 02/2018   epidural steroids     ESOPHAGOGASTRODUODENOSCOPY (EGD) WITH ESOPHAGEAL DILATION N/A 02/06/2014   Procedure: ESOPHAGOGASTRODUODENOSCOPY (EGD) WITH ESOPHAGEAL DILATION;  Surgeon: Daneil Dolin, MD;  [facility];  Service: Endoscopy;  Laterality: N/A;  9:30   OOPHORECTOMY  1976   ORIF ANKLE FRACTURE  01/18/2012   Procedure: OPEN REDUCTION INTERNAL FIXATION (ORIF) ANKLE FRACTURE;  Surgeon: Arther Abbott, MD;  Location: [facility];  Service: Orthopedics;  Laterality: Left;   ORIF ANKLE FRACTURE Right 01/13/2016   Procedure: OPEN REDUCTION INTERNAL FIXATION (ORIF) ANKLE FRACTURE;  Surgeon: Tim Lair  Aline Brochure, MD;  Location: [facility];  Service: Orthopedics;  Laterality: Right;    Social History   Socioeconomic History   Marital status: Widowed    Spouse name: Not on file   Number of children: 4   Years of education: college 1   Highest education level: Not on file  Occupational History   Occupation: employed in the tobacco industry     Employer: RETIRED   Occupation: retired  Tobacco Use   Smoking status: Former    Packs/day: 1.50    Years: 40.00    Total pack years: 60.00    Types: Cigarettes    Quit date: 06/21/2006    Years since  quitting: 15.9   Smokeless tobacco: Never  Vaping Use   Vaping Use: Never used  Substance and Sexual Activity   Alcohol use: No   Drug use: No   Sexual activity: Not Currently  Other Topics Concern   Not on file  Social History Narrative   Long term resident of [facility]    Social Determinants of Health   Financial Resource Strain: [place]  (12/02/2020)   Overall Financial Resource Strain (CARDIA)    Difficulty of Paying Living Expenses: Not hard at all  Food Insecurity: No Food Insecurity (12/02/2020)   Hunger Vital Sign    Worried About Running Out of Food in the Last Year: Never true    [place] in the Last Year: Never true  Transportation Needs: No Transportation Needs (12/02/2020)   PRAPARE - Hydrologist (Medical): No    Lack of Transportation (Non-Medical): No  Physical Activity: Inactive (12/02/2020)   Exercise Vital Sign    Days of Exercise per Week: 0 days    Minutes of Exercise per Session: 0 min  Stress: No Stress Concern Present (12/02/2020)   [place]    Feeling of Stress : Not at all  Social Connections: Socially Isolated (12/02/2020)   Social Connection and Isolation Panel [NHANES]    Frequency of Communication with Friends and Family: Three times a week    Frequency of Social Gatherings with Friends and Family: Once a week    Attends Religious Services: Never    Marine scientist or Organizations: No    Attends Archivist Meetings: Never    Marital Status: Widowed  Intimate Partner Violence: Not At Risk (12/02/2020)   Humiliation, Afraid, Rape, and Kick questionnaire    Fear of Current or Ex-Partner: No    Emotionally Abused: No    Physically Abused: No    Sexually Abused: No   Family History  Problem Relation Age of Onset   Stomach cancer Father    Cancer Father    Cancer Mother        brain    Breast cancer Mother    Arthritis Other        VITAL SIGNS BP 110/66   Pulse 73   Temp 97.8 F (36.6 C)   Resp 18   Ht '6\' 1"'$  (1.854 m)   Wt 162 lb 3.2 oz (73.6 kg)   SpO2 94%   BMI 21.40 kg/m   Outpatient Encounter Medications as of 06/11/2022  Medication Sig   acetaminophen (TYLENOL) 325 MG tablet Take 650 mg by mouth every 8 (eight) hours as needed for moderate pain.   albuterol (VENTOLIN HFA) 108 (90 Base) MCG/ACT inhaler Inhale 2 puffs into the lungs every 6 (six) hours  as needed for wheezing or shortness of breath.   alendronate (FOSAMAX) 70 MG tablet Take 70 mg by mouth every Sunday. Take on an empty stomach at least 30 mns prior to food. Sit upright x 57ms after taking meds.   atorvastatin (LIPITOR) 10 MG tablet TAKE 1 TABLET EVERY DAY   Calcium Carb-Cholecalciferol (CALCIUM 600+D) 600-10 MG-MCG TABS Take 1 tablet by mouth daily.   cholecalciferol (VITAMIN D3) 25 MCG (1000 UNIT) tablet Take 1,000 Units by mouth daily.   donepezil (ARICEPT) 10 MG tablet TAKE 1 TABLET AT BEDTIME   Lactase (LACTAID FAST ACT) 9000 units TABS Take 9,000 Units by mouth as needed (prior to dairy products).   loperamide (IMODIUM A-D) 2 MG tablet Take 2-4 mg by mouth See admin instructions. Take 4 mg after first loose stool and then 2 mg after each loose stool, max 8 tabs per 24 hours   loratadine (CLARITIN) 10 MG tablet Take 10 mg by mouth daily.   mirabegron ER (MYRBETRIQ) 50 MG TB24 tablet Take 50 mg by mouth daily.   NON FORMULARY Diet: Regular diet with chopped meats and gravy to all meats.   Nutritional Supplements (ENSURE CLEAR) LIQD Take 1 Can by mouth 2 (two) times daily between meals. Due to weight loss   omeprazole (PRILOSEC) 40 MG capsule TAKE 1 CAPSULE EVERY DAY   OXYGEN Inhale 2 L into the lungs continuous.   potassium chloride SA (KLOR-CON M) 20 MEQ tablet Take 20 mEq by mouth daily.   STRIVERDI RESPIMAT 2.5 MCG/ACT AERS INHALE 2 PUFFS ONE TIME DAILY AT THE SAME TIME   verapamil (CALAN-SR) 180 MG CR tablet Take 180 mg by  mouth daily.   [DISCONTINUED] verapamil (CALAN-SR) 240 MG CR tablet TAKE 1 TABLET AT BEDTIME   No facility-administered encounter medications on file as of 06/11/2022.     SIGNIFICANT DIAGNOSTIC EXAMS   LABS REVIEWED PREVIOUS  06-02-21: urine culture: no growth; hep C neg 07-21-21: wbc 4.8; hgb 10.0; hct 34.4; mcv 87.3 plt 224; glucose 138; bun 22; creat 0.87; k+ 3.4; na++ 130; ca 8.5; GFR>60 07-27-21: albumin 3.7; k+ 3.9 09-02-21: glucose 84; bun 18; creat 0.77; k+ 3.7; na++ 139; ca 9.0; GFR>60; liver normal albumin 4.0 tsh 1.075 free t4: 0.93  12-03-21: wbc 3.6; hgb 10.3; hct 33.9; mcv 88.3 plt 220; glucose 87; bun 19; creat 0.92; k+ 4.1; na++ 140; ca 8.9; >60; total protein 6.1 albumin 3.6 01-04-22: wbc 5.8; hgb 10.0; hct 32.9; mcv 89.2 plt 188; glucose 78; bun 0.88; creat 0.88; k+ 3.8; na++ 136; ca 9.2; GFR >60 d-dimer 0.54; CRP 1.6  01-04-22 (ED) wbc10.6; hgb 12.0; hct 40.6; mcv89.8 plt 229; glucose 160; bun 26; creat 1.24; k+ 4.0; na++ 136; ca 9.0; GFR 44; protein 7.4 albumin 4.0 01-08-31: wbc 14.6; hgb 9.5; hct 31.9; mcv 86.4 plt 219; glucose 118; bun 17; creat 0.62; k+ 3.5; na++ 139; ca 8.2; GFR>60 protein 6.6; albumin 3.1; mag 2.0; CRP 12.2; d-dimer 0.77  05-21-22: wbc 4.8; hgb 11.; hct 37.5; mcv 87.2 plt 229; glucose 74; bun 26; creat 1.05 ;k+ 3.8; na++ 140; ca 9.2; gfr 54   NO NEW LABS.   Review of Systems  Constitutional:  Negative for malaise/fatigue.  Respiratory:  Negative for cough and shortness of breath.   Cardiovascular:  Negative for chest pain, palpitations and leg swelling.  Gastrointestinal:  Negative for abdominal pain, constipation and heartburn.  Musculoskeletal:  Negative for back pain, joint pain and myalgias.  Skin: Negative.   Neurological:  Negative for dizziness.  Psychiatric/Behavioral:  The patient is not nervous/anxious.    Physical Exam Constitutional:      General: She is not in acute distress.    Appearance: She is well-developed. She is not  diaphoretic.  Neck:     Thyroid: No thyromegaly.  Cardiovascular:     Rate and Rhythm: Normal rate and regular rhythm.     Pulses: Normal pulses.     Heart sounds: Murmur heard.  Pulmonary:     Effort: Pulmonary effort is normal. No respiratory distress.     Breath sounds: Normal breath sounds.  Abdominal:     General: Bowel sounds are normal. There is no distension.     Palpations: Abdomen is soft.     Tenderness: There is no abdominal tenderness.  Musculoskeletal:        General: Normal range of motion.     Cervical back: Neck supple.     Right lower leg: No edema.     Left lower leg: No edema.  Lymphadenopathy:     Cervical: No cervical adenopathy.  Skin:    General: Skin is warm and dry.  Neurological:     Mental Status: She is alert. Mental status is at baseline.  Psychiatric:        Mood and Affect: Mood normal.       ASSESSMENT/ PLAN:  TODAY  SVT (supraventricular tachycardia) Chronic respiratory failure with hypoxia and hypercapnia  Major neurocognitive disorder Vascular dementia without behavioral disturbance:   Will continue current medications Will continue current plan of care Will continue to monitor her status.    Time spent with patient: 40 minutes: medications; plan of care dietary    Ok Edwards NP [facility]  call [phone]

## 2022-07-02 ENCOUNTER — Encounter: Payer: Self-pay | Admitting: Adult Health

## 2022-07-02 DIAGNOSIS — M8580 Other specified disorders of bone density and structure, unspecified site: Secondary | ICD-10-CM | POA: Insufficient documentation

## 2022-07-13 ENCOUNTER — Non-Acute Institutional Stay (SKILLED_NURSING_FACILITY): Payer: Medicare PPO | Admitting: Adult Health

## 2022-07-13 ENCOUNTER — Encounter: Payer: Self-pay | Admitting: Adult Health

## 2022-07-13 DIAGNOSIS — F324 Major depressive disorder, single episode, in partial remission: Secondary | ICD-10-CM | POA: Diagnosis not present

## 2022-07-13 DIAGNOSIS — N3281 Overactive bladder: Secondary | ICD-10-CM | POA: Diagnosis not present

## 2022-07-13 DIAGNOSIS — R7989 Other specified abnormal findings of blood chemistry: Secondary | ICD-10-CM

## 2022-07-13 DIAGNOSIS — E785 Hyperlipidemia, unspecified: Secondary | ICD-10-CM

## 2022-07-13 NOTE — Progress Notes (Unsigned)
Location:  [place] Room Number: 149-W Place of Service:  SNF (31)   CODE STATUS: DNR  Allergies  Allergen Reactions   Fenofibrate Nausea And Vomiting   Pravastatin Sodium Nausea And Vomiting   Sulfonamide Derivatives Other (See Comments)    unknown   Ciprofloxacin Rash    Chief Complaint  Patient presents with   Medical Management of Chronic Issues    Routine Visit.   Immunizations    Discuss the need for Covid Booster, and Influenza vaccine.     HPI:    Past Medical History:  Diagnosis Date   Abnormality of gait 06/06/2014   Allergic rhinitis    Anemia    Ankle fracture, right    Anxiety    Arm fracture, left    Arthritis    abnormal gait    COPD (chronic obstructive pulmonary disease) (HCC)    chronic CO2 retention, decreased DLCO    Cystic acne    adult    Degenerative disc disease, cervical    syrinx C3-7   Degenerative disc disease, lumbar    L5 nerve impingement    Dementia (HCC)    Depression    DNR (do not resuscitate)    Fracture of right proximal fibula 07/13/18 09/05/2018   GERD (gastroesophageal reflux disease)    Heme positive stool 10/10/2017   History of colon surgery    right hemicolectomy for high-grade adenomas in right colon 2013   Hyperglycemia    Hypertension    Low back pain    Mediastinal lymphadenopathy 1/9   resolving    Memory difficulty 09/20/2014   mild dementia   Onychomycosis    OSA on CPAP    Sleep apnea    stop bang score 6    Past Surgical History:  Procedure Laterality Date   ABDOMINAL HYSTERECTOMY  1976   secondary to bleeding    APPENDECTOMY     COLON RESECTION  08/18/2012   Procedure: HAND ASSISTED LAPAROSCOPIC COLON RESECTION;  Surgeon: Jamesetta So, MD;  Location: [facility];  Service: General;  Laterality: N/A;   COLONOSCOPY  07/20/2012   Dr. Gala Romney: multiple colonic polyps with large polyps on right side, s/p saline-assisted debulking piecemeal polypectomy and ablation. Not all  removed. Path with tubulovillous adenomas, high grade dysplasia.    COLONOSCOPY N/A 03/09/2013   [id number] polyps-tubular adenomas. S/p right hemicolectomy. next tcs 02/2018   epidural steroids     ESOPHAGOGASTRODUODENOSCOPY (EGD) WITH ESOPHAGEAL DILATION N/A 02/06/2014   Procedure: ESOPHAGOGASTRODUODENOSCOPY (EGD) WITH ESOPHAGEAL DILATION;  Surgeon: Daneil Dolin, MD;  Location: [facility];  Service: Endoscopy;  Laterality: N/A;  9:30   OOPHORECTOMY  1976   ORIF ANKLE FRACTURE  01/18/2012   Procedure: OPEN REDUCTION INTERNAL FIXATION (ORIF) ANKLE FRACTURE;  Surgeon: Arther Abbott, MD;  Location: [facility];  Service: Orthopedics;  Laterality: Left;   ORIF ANKLE FRACTURE Right 01/13/2016   Procedure: OPEN REDUCTION INTERNAL FIXATION (ORIF) ANKLE FRACTURE;  Surgeon: Carole Civil, MD;  Location: [facility];  Service: Orthopedics;  Laterality: Right;    Social History   Socioeconomic History   Marital status: Widowed    Spouse name: Not on file   Number of children: 4   Years of education: college 1   Highest education level: Not on file  Occupational History   Occupation: employed in the tobacco industry     Employer: RETIRED   Occupation: retired  Tobacco Use   Smoking status: Former  Packs/day: 1.50    Years: 40.00    Total pack years: 60.00    Types: Cigarettes    Quit date: 06/21/2006    Years since quitting: 16.0   Smokeless tobacco: Never  Vaping Use   Vaping Use: Never used  Substance and Sexual Activity   Alcohol use: No   Drug use: No   Sexual activity: Not Currently  Other Topics Concern   Not on file  Social History Narrative   Long term resident of [facility]    Social Determinants of Health   Financial Resource Strain: Low Risk  (12/02/2020)   Overall Financial Resource Strain (CARDIA)    Difficulty of Paying Living Expenses: Not hard at all  Food Insecurity: No Food Insecurity (12/02/2020)   Hunger Vital Sign    Worried About Running Out of Food in the Last  Year: Never true    Ran Out of Food in the Last Year: Never true  Transportation Needs: No Transportation Needs (12/02/2020)   PRAPARE - Hydrologist (Medical): No    Lack of Transportation (Non-Medical): No  Physical Activity: Inactive (12/02/2020)   Exercise Vital Sign    Days of Exercise per Week: 0 days    Minutes of Exercise per Session: 0 min  Stress: No Stress Concern Present (12/02/2020)   [place]    Feeling of Stress : Not at all  Social Connections: Socially Isolated (12/02/2020)   Social Connection and Isolation Panel [NHANES]    Frequency of Communication with Friends and Family: Three times a week    Frequency of Social Gatherings with Friends and Family: Once a week    Attends Religious Services: Never    Marine scientist or Organizations: No    Attends Archivist Meetings: Never    Marital Status: Widowed  Intimate Partner Violence: Not At Risk (12/02/2020)   Humiliation, Afraid, Rape, and Kick questionnaire    Fear of Current or Ex-Partner: No    Emotionally Abused: No    Physically Abused: No    Sexually Abused: No   Family History  Problem Relation Age of Onset   Stomach cancer Father    Cancer Father    Cancer Mother        brain    Breast cancer Mother    Arthritis Other       VITAL SIGNS BP 135/76   Pulse 86   Temp 97.8 F (36.6 C)   Resp 20   Ht '6\' 1"'$  (1.854 m)   Wt 160 lb 12.8 oz (72.9 kg)   SpO2 98%   BMI 21.22 kg/m   Outpatient Encounter Medications as of 07/13/2022  Medication Sig   acetaminophen (TYLENOL) 325 MG tablet Take 650 mg by mouth every 8 (eight) hours as needed for moderate pain.   albuterol (VENTOLIN HFA) 108 (90 Base) MCG/ACT inhaler Inhale 2 puffs into the lungs every 6 (six) hours as needed for wheezing or shortness of breath.   alendronate (FOSAMAX) 70 MG tablet Take 70 mg by mouth every Sunday. Take on an empty  stomach at least 30 mns prior to food. Sit upright x 66ms after taking meds.   atorvastatin (LIPITOR) 10 MG tablet TAKE 1 TABLET EVERY DAY   Calcium Carb-Cholecalciferol (CALCIUM 600+D) 600-10 MG-MCG TABS Take 1 tablet by mouth daily.   cholecalciferol (VITAMIN D3) 25 MCG (1000 UNIT) tablet Take 1,000 Units by mouth daily.   donepezil (  ARICEPT) 10 MG tablet TAKE 1 TABLET AT BEDTIME   Lactase (LACTAID FAST ACT) 9000 units TABS Take 9,000 Units by mouth as needed (prior to dairy products).   loperamide (IMODIUM A-D) 2 MG tablet Take 2-4 mg by mouth as needed for diarrhea or loose stools. Take 4 mg after first loose stool and then 2 mg after each loose stool, max 8 tabs per 24 hours   loratadine (CLARITIN) 10 MG tablet Take 10 mg by mouth daily.   mirabegron ER (MYRBETRIQ) 50 MG TB24 tablet Take 50 mg by mouth daily.   NON FORMULARY Diet: Regular diet with chopped meats and gravy to all meats.   Nutritional Supplements (ENSURE CLEAR) LIQD Take 1 Can by mouth 2 (two) times daily between meals. Due to weight loss   omeprazole (PRILOSEC) 40 MG capsule TAKE 1 CAPSULE EVERY DAY   OXYGEN Inhale 2 L into the lungs continuous.   potassium chloride SA (KLOR-CON M) 20 MEQ tablet Take 20 mEq by mouth daily.   STRIVERDI RESPIMAT 2.5 MCG/ACT AERS INHALE 2 PUFFS ONE TIME DAILY AT THE SAME TIME   verapamil (CALAN-SR) 180 MG CR tablet Take 180 mg by mouth daily.   No facility-administered encounter medications on file as of 07/13/2022.     SIGNIFICANT DIAGNOSTIC EXAMS       ASSESSMENT/ PLAN:     Ok Edwards NP [facility]  Contact [phone] Monday through Friday 8am- 5pm  After hours call [phone]

## 2022-07-19 DIAGNOSIS — R531 Weakness: Secondary | ICD-10-CM | POA: Diagnosis not present

## 2022-07-20 ENCOUNTER — Non-Acute Institutional Stay (SKILLED_NURSING_FACILITY): Payer: Medicare PPO | Admitting: Internal Medicine

## 2022-07-20 ENCOUNTER — Encounter: Payer: Self-pay | Admitting: Internal Medicine

## 2022-07-20 DIAGNOSIS — D649 Anemia, unspecified: Secondary | ICD-10-CM | POA: Diagnosis not present

## 2022-07-20 DIAGNOSIS — J9612 Chronic respiratory failure with hypercapnia: Secondary | ICD-10-CM | POA: Diagnosis not present

## 2022-07-20 DIAGNOSIS — I10 Essential (primary) hypertension: Secondary | ICD-10-CM

## 2022-07-20 DIAGNOSIS — N1831 Chronic kidney disease, stage 3a: Secondary | ICD-10-CM | POA: Diagnosis not present

## 2022-07-20 DIAGNOSIS — F0150 Vascular dementia without behavioral disturbance: Secondary | ICD-10-CM | POA: Diagnosis not present

## 2022-07-20 DIAGNOSIS — J9611 Chronic respiratory failure with hypoxia: Secondary | ICD-10-CM | POA: Diagnosis not present

## 2022-07-20 NOTE — Assessment & Plan Note (Addendum)
February 2023 H/H was 9.5/31.9.  Current H/H is 11.1/37.5; indices are hypochromic, normocytic.  No active bleeding dyscrasias reported by staff.

## 2022-07-20 NOTE — Assessment & Plan Note (Addendum)
Earlier this year creatinine was 0.62 with GFR greater than 60 indicating CKD stage II.  There is been slight progression of CKD with current creatinine 1.05 with GFR of 54, indicating CKD stage IIIa.  Med list reviewed; no change in medications or dosage necessary unless there is progression of CKD.

## 2022-07-20 NOTE — Progress Notes (Signed)
NURSING HOME LOCATION:  [facility] ROOM NUMBER:  20 W  CODE STATUS:  DNR  PCP:  Ok Edwards NP  This is a nursing facility follow up visit of chronic medical diagnoses & to document compliance with Regulation 483.30 (c) in The [place] Manual Phase 2 which mandates caregiver visit ( visits can alternate among physician, PA or NP as per statutes) within 10 days of 30 days / 60 days/ 90 days post admission to SNF date    Interim medical record and care since last SNF visit was updated with review of diagnostic studies and change in clinical status since last visit were documented.  HPI: She is a permanent resident of this facility with medical diagnoses of history of anemia, COPD, degenerative disc disease, dementia, GERD, essential hypertension, and OSA. Surgeries and procedures include abdominal hysterectomy, EGD, and colon resection.  In July labs revealed a creatinine 1.05 with GFR 54 indicating CKD stage IIIa.  This represents progression of CKD as creatinine was 0.62 and GFR greater than 60 in February of this year.  Hypochromic, normocytic anemia was present with H/H of 11.1/37.5.  Anemia has improved significantly as H/H was 9.5/31.9 in February.  Review of systems: Dementia invalidated responses.  Asked how she were doing ,her response was "okay."  All other responses were a monosyllabic "no."   Constitutional: No fever, significant weight change, fatigue  Eyes: No redness, discharge, pain, vision change ENT/mouth: No nasal congestion,  purulent discharge, earache, change in hearing, sore throat  Cardiovascular: No chest pain, palpitations, paroxysmal nocturnal dyspnea, claudication, edema  Respiratory: No cough, sputum production, hemoptysis, DOE, significant snoring, apnea   Gastrointestinal: No heartburn, dysphagia, abdominal pain, nausea /vomiting, rectal bleeding, melena, change in bowels Genitourinary: No dysuria, hematuria, pyuria,  incontinence, nocturia Musculoskeletal: No joint stiffness, joint swelling, weakness, pain Dermatologic: No rash, pruritus, change in appearance of skin Neurologic: No dizziness, headache, syncope, seizures, numbness, tingling Psychiatric: No significant anxiety, depression, insomnia, anorexia Endocrine: No change in hair/skin/nails, excessive thirst, excessive hunger, excessive urination  Hematologic/lymphatic: No significant bruising, lymphadenopathy, abnormal bleeding Allergy/immunology: No itchy/watery eyes, significant sneezing, urticaria, angioedema  Physical exam:  Pertinent or positive findings: Wearing nasal oxygen.  Facies are blank.  Ptosis is greater on the right than the left.Complete dentures present. She has low-grade rhonchi diffusely.  Abdomen is protuberant.  Pedal pulses are decreased but palpable.  Strength to opposition is surprisingly good.  Deep tendon reflexes are 1+ in the upper extremities and 1/2 + at the knees.  She exhibits atrophy of the calves despite having good strength to opposition.  She is wearing an antiwandering device @ the right ankle.  General appearance: Adequately nourished; no acute distress, increased work of breathing is present.   Lymphatic: No lymphadenopathy about the head, neck, axilla. Eyes: No conjunctival inflammation or lid edema is present. There is no scleral icterus. Ears:  External ear exam shows no significant lesions or deformities.   Nose:  External nasal examination shows no deformity or inflammation. Nasal mucosa are pink and moist without lesions, exudates Neck:  No thyromegaly, masses, tenderness noted.    Heart:  Normal rate and regular rhythm. S1 and S2 normal without gallop, murmur, click, rub .  Lungs:  without wheezes,rales, rubs. Abdomen: Bowel sounds are normal. Abdomen is soft and nontender with no organomegaly, hernias, masses. GU: Deferred  Extremities:  No cyanosis, clubbing, edema  Neurologic exam :Balance, Rhomberg,  finger to nose testing could not be completed  due to clinical state Skin: Warm & dry w/o tenting. No significant lesions or rash.  See summary under each active problem in the Problem List with associated updated therapeutic plan

## 2022-07-21 NOTE — Assessment & Plan Note (Signed)
Excellent oxygenation on nasal O2. Minor low grade rhochi w/o any respiratory distress.  No pulmonary toilet regimen change indicated.

## 2022-07-21 NOTE — Patient Instructions (Signed)
See assessment and plan under each diagnosis in the problem list and acutely for this visit 

## 2022-07-21 NOTE — Assessment & Plan Note (Signed)
Oriented only to self, unable to provide any meaningful history. Anti wandering anklet in place.

## 2022-07-21 NOTE — Assessment & Plan Note (Signed)
BP controlled; no change in antihypertensive medications  

## 2022-07-25 DIAGNOSIS — F0150 Vascular dementia without behavioral disturbance: Secondary | ICD-10-CM | POA: Diagnosis not present

## 2022-07-25 DIAGNOSIS — J449 Chronic obstructive pulmonary disease, unspecified: Secondary | ICD-10-CM | POA: Diagnosis not present

## 2022-07-25 DIAGNOSIS — R488 Other symbolic dysfunctions: Secondary | ICD-10-CM | POA: Diagnosis not present

## 2022-08-17 DIAGNOSIS — Z23 Encounter for immunization: Secondary | ICD-10-CM | POA: Diagnosis not present

## 2022-08-23 DIAGNOSIS — Z1383 Encounter for screening for respiratory disorder NEC: Secondary | ICD-10-CM | POA: Diagnosis not present

## 2022-08-23 DIAGNOSIS — Z1159 Encounter for screening for other viral diseases: Secondary | ICD-10-CM | POA: Diagnosis not present

## 2022-08-23 DIAGNOSIS — J449 Chronic obstructive pulmonary disease, unspecified: Secondary | ICD-10-CM | POA: Diagnosis not present

## 2022-08-25 DIAGNOSIS — J449 Chronic obstructive pulmonary disease, unspecified: Secondary | ICD-10-CM | POA: Diagnosis not present

## 2022-08-25 DIAGNOSIS — Z1383 Encounter for screening for respiratory disorder NEC: Secondary | ICD-10-CM | POA: Diagnosis not present

## 2022-08-25 DIAGNOSIS — Z1159 Encounter for screening for other viral diseases: Secondary | ICD-10-CM | POA: Diagnosis not present

## 2022-08-27 DIAGNOSIS — F0150 Vascular dementia without behavioral disturbance: Secondary | ICD-10-CM | POA: Diagnosis not present

## 2022-08-27 DIAGNOSIS — J9611 Chronic respiratory failure with hypoxia: Secondary | ICD-10-CM | POA: Diagnosis not present

## 2022-08-27 DIAGNOSIS — M6281 Muscle weakness (generalized): Secondary | ICD-10-CM | POA: Diagnosis not present

## 2022-08-27 DIAGNOSIS — M159 Polyosteoarthritis, unspecified: Secondary | ICD-10-CM | POA: Diagnosis not present

## 2022-08-27 DIAGNOSIS — R293 Abnormal posture: Secondary | ICD-10-CM | POA: Diagnosis not present

## 2022-09-03 ENCOUNTER — Encounter: Payer: Self-pay | Admitting: Adult Health

## 2022-09-03 ENCOUNTER — Non-Acute Institutional Stay (SKILLED_NURSING_FACILITY): Payer: Medicare PPO | Admitting: Adult Health

## 2022-09-03 DIAGNOSIS — J449 Chronic obstructive pulmonary disease, unspecified: Secondary | ICD-10-CM | POA: Diagnosis not present

## 2022-09-03 DIAGNOSIS — J9612 Chronic respiratory failure with hypercapnia: Secondary | ICD-10-CM

## 2022-09-03 DIAGNOSIS — K219 Gastro-esophageal reflux disease without esophagitis: Secondary | ICD-10-CM | POA: Diagnosis not present

## 2022-09-03 DIAGNOSIS — I10 Essential (primary) hypertension: Secondary | ICD-10-CM | POA: Diagnosis not present

## 2022-09-03 DIAGNOSIS — F0150 Vascular dementia without behavioral disturbance: Secondary | ICD-10-CM

## 2022-09-03 DIAGNOSIS — J9611 Chronic respiratory failure with hypoxia: Secondary | ICD-10-CM

## 2022-09-03 NOTE — Progress Notes (Unsigned)
Location:  [place] Room Number: NO/149/W Place of Service:  SNF (31)   CODE STATUS: DNR  Allergies  Allergen Reactions   Fenofibrate Nausea And Vomiting   Pravastatin Sodium Nausea And Vomiting   Sulfonamide Derivatives Other (See Comments)    unknown   Ciprofloxacin Rash    Chief Complaint  Patient presents with   Acute Visit    HPI:    Past Medical History:  Diagnosis Date   Abnormality of gait 06/06/2014   Allergic rhinitis    Anemia    Ankle fracture, right    Anxiety    Arm fracture, left    Arthritis    abnormal gait    COPD (chronic obstructive pulmonary disease) ([place])    chronic CO2 retention, decreased DLCO    Cystic acne    adult    Degenerative disc disease, cervical    syrinx C3-7   Degenerative disc disease, lumbar    L5 nerve impingement    Dementia (HCC)    Depression    DNR (do not resuscitate)    Fracture of right proximal fibula 07/13/18 09/05/2018   GERD (gastroesophageal reflux disease)    Heme positive stool 10/10/2017   History of colon surgery    right hemicolectomy for high-grade adenomas in right colon 2013   Hyperglycemia    Hypertension    Low back pain    Mediastinal lymphadenopathy 1/9   resolving    Memory difficulty 09/20/2014   mild dementia   Onychomycosis    OSA on CPAP    Sleep apnea    stop bang score 6    Past Surgical History:  Procedure Laterality Date   ABDOMINAL HYSTERECTOMY  1976   secondary to bleeding    APPENDECTOMY     COLON RESECTION  08/18/2012   Procedure: HAND ASSISTED LAPAROSCOPIC COLON RESECTION;  Surgeon: Jamesetta So, MD;  Location: [facility];  Service: General;  Laterality: N/A;   COLONOSCOPY  07/20/2012   Dr. Gala Romney: multiple colonic polyps with large polyps on right side, s/p saline-assisted debulking piecemeal polypectomy and ablation. Not all removed. Path with tubulovillous adenomas, high grade dysplasia.    COLONOSCOPY N/A 03/09/2013   [id number] polyps-tubular  adenomas. S/p right hemicolectomy. next tcs 02/2018   epidural steroids     ESOPHAGOGASTRODUODENOSCOPY (EGD) WITH ESOPHAGEAL DILATION N/A 02/06/2014   Procedure: ESOPHAGOGASTRODUODENOSCOPY (EGD) WITH ESOPHAGEAL DILATION;  Surgeon: Daneil Dolin, MD;  [facility];  Service: Endoscopy;  Laterality: N/A;  9:30   OOPHORECTOMY  1976   ORIF ANKLE FRACTURE  01/18/2012   Procedure: OPEN REDUCTION INTERNAL FIXATION (ORIF) ANKLE FRACTURE;  Surgeon: Arther Abbott, MD;  Location: [facility];  Service: Orthopedics;  Laterality: Left;   ORIF ANKLE FRACTURE Right 01/13/2016   Procedure: OPEN REDUCTION INTERNAL FIXATION (ORIF) ANKLE FRACTURE;  Surgeon: Carole Civil, MD;  Location: [facility];  Service: Orthopedics;  Laterality: Right;    Social History   Socioeconomic History   Marital status: Widowed    Spouse name: Not on file   Number of children: 4   Years of education: college 1   Highest education level: Not on file  Occupational History   Occupation: employed in the tobacco industry     Employer: RETIRED   Occupation: retired  Tobacco Use   Smoking status: Former    Packs/day: 1.50    Years: 40.00    Total pack years: 60.00    Types: Cigarettes    Quit date:  06/21/2006    Years since quitting: 16.2   Smokeless tobacco: Never  Vaping Use   Vaping Use: Never used  Substance and Sexual Activity   Alcohol use: No   Drug use: No   Sexual activity: Not Currently  Other Topics Concern   Not on file  Social History Narrative   Long term resident of [facility]    Social Determinants of Health   Financial Resource Strain: Low Risk  (12/02/2020)   Overall Financial Resource Strain (CARDIA)    Difficulty of Paying Living Expenses: Not hard at all  Food Insecurity: No Food Insecurity (12/02/2020)   Hunger Vital Sign    Worried About Running Out of Food in the Last Year: Never true    Ran Out of Food in the Last Year: Never true  Transportation Needs: No Transportation Needs (12/02/2020)    PRAPARE - Hydrologist (Medical): No    Lack of Transportation (Non-Medical): No  Physical Activity: Inactive (12/02/2020)   Exercise Vital Sign    Days of Exercise per Week: 0 days    Minutes of Exercise per Session: 0 min  Stress: No Stress Concern Present (12/02/2020)   [place]    Feeling of Stress : Not at all  Social Connections: Socially Isolated (12/02/2020)   Social Connection and Isolation Panel [NHANES]    Frequency of Communication with Friends and Family: Three times a week    Frequency of Social Gatherings with Friends and Family: Once a week    Attends Religious Services: Never    Marine scientist or Organizations: No    Attends Archivist Meetings: Never    Marital Status: Widowed  Intimate Partner Violence: Not At Risk (12/02/2020)   Humiliation, Afraid, Rape, and Kick questionnaire    Fear of Current or Ex-Partner: No    Emotionally Abused: No    Physically Abused: No    Sexually Abused: No   Family History  Problem Relation Age of Onset   Stomach cancer Father    Cancer Father    Cancer Mother        brain    Breast cancer Mother    Arthritis Other       VITAL SIGNS BP (!) 149/86   Pulse 88   Temp (!) 97.4 F (36.3 C)   Resp 18   Ht 6' (1.829 m)   Wt 169 lb (76.7 kg)   SpO2 98%   BMI 22.92 kg/m   Outpatient Encounter Medications as of 09/03/2022  Medication Sig   acetaminophen (TYLENOL) 325 MG tablet Take 650 mg by mouth every 8 (eight) hours as needed for moderate pain.   albuterol (VENTOLIN HFA) 108 (90 Base) MCG/ACT inhaler Inhale 2 puffs into the lungs every 6 (six) hours as needed for wheezing or shortness of breath.   alendronate (FOSAMAX) 70 MG tablet Take 70 mg by mouth every Sunday. Take on an empty stomach at least 30 mns prior to food. Sit upright x 1ms after taking meds.   atorvastatin (LIPITOR) 10 MG tablet TAKE 1 TABLET  EVERY DAY   Calcium Carb-Cholecalciferol (CALCIUM 600+D) 600-10 MG-MCG TABS Take 1 tablet by mouth daily.   cholecalciferol (VITAMIN D3) 25 MCG (1000 UNIT) tablet Take 1,000 Units by mouth daily.   donepezil (ARICEPT) 10 MG tablet TAKE 1 TABLET AT BEDTIME   Lactase (LACTAID FAST ACT) 9000 units TABS Take 9,000 Units by mouth as needed (  prior to dairy products).   loperamide (IMODIUM A-D) 2 MG tablet Take 2-4 mg by mouth as needed for diarrhea or loose stools. Take 4 mg after first loose stool and then 2 mg after each loose stool, max 8 tabs per 24 hours   loratadine (CLARITIN) 10 MG tablet Take 10 mg by mouth daily.   mirabegron ER (MYRBETRIQ) 50 MG TB24 tablet Take 50 mg by mouth daily.   NON FORMULARY Diet: Regular diet with chopped meats and gravy to all meats.   Nutritional Supplements (ENSURE CLEAR) LIQD Take 1 Can by mouth 2 (two) times daily between meals. Due to weight loss   omeprazole (PRILOSEC) 40 MG capsule TAKE 1 CAPSULE EVERY DAY   OXYGEN Inhale 2 L into the lungs continuous.   potassium chloride SA (KLOR-CON M) 20 MEQ tablet Take 20 mEq by mouth daily.   STRIVERDI RESPIMAT 2.5 MCG/ACT AERS INHALE 2 PUFFS ONE TIME DAILY AT THE SAME TIME   verapamil (CALAN-SR) 180 MG CR tablet Take 180 mg by mouth daily.   No facility-administered encounter medications on file as of 09/03/2022.     SIGNIFICANT DIAGNOSTIC EXAMS       ASSESSMENT/ PLAN:     Ok Edwards NP [facility]  Contact [phone] Monday through Friday 8am- 5pm  After hours call [phone]

## 2022-10-11 DIAGNOSIS — M159 Polyosteoarthritis, unspecified: Secondary | ICD-10-CM | POA: Diagnosis not present

## 2022-10-11 DIAGNOSIS — M6281 Muscle weakness (generalized): Secondary | ICD-10-CM | POA: Diagnosis not present

## 2022-10-11 DIAGNOSIS — Z9181 History of falling: Secondary | ICD-10-CM | POA: Diagnosis not present

## 2022-10-11 DIAGNOSIS — J449 Chronic obstructive pulmonary disease, unspecified: Secondary | ICD-10-CM | POA: Diagnosis not present

## 2022-10-11 DIAGNOSIS — R262 Difficulty in walking, not elsewhere classified: Secondary | ICD-10-CM | POA: Diagnosis not present

## 2022-10-12 ENCOUNTER — Non-Acute Institutional Stay (SKILLED_NURSING_FACILITY): Payer: Medicare PPO | Admitting: Adult Health

## 2022-10-12 ENCOUNTER — Encounter: Payer: Self-pay | Admitting: Adult Health

## 2022-10-12 DIAGNOSIS — M6281 Muscle weakness (generalized): Secondary | ICD-10-CM | POA: Diagnosis not present

## 2022-10-12 DIAGNOSIS — M159 Polyosteoarthritis, unspecified: Secondary | ICD-10-CM | POA: Diagnosis not present

## 2022-10-12 DIAGNOSIS — M810 Age-related osteoporosis without current pathological fracture: Secondary | ICD-10-CM

## 2022-10-12 DIAGNOSIS — N1831 Chronic kidney disease, stage 3a: Secondary | ICD-10-CM

## 2022-10-12 DIAGNOSIS — R262 Difficulty in walking, not elsewhere classified: Secondary | ICD-10-CM | POA: Diagnosis not present

## 2022-10-12 DIAGNOSIS — Z9181 History of falling: Secondary | ICD-10-CM | POA: Diagnosis not present

## 2022-10-12 DIAGNOSIS — D649 Anemia, unspecified: Secondary | ICD-10-CM

## 2022-10-12 DIAGNOSIS — J449 Chronic obstructive pulmonary disease, unspecified: Secondary | ICD-10-CM | POA: Diagnosis not present

## 2022-10-12 NOTE — Progress Notes (Signed)
Location:  [place] Room Number: [id number] Place of Service:  SNF (31)   CODE STATUS: dnr   Allergies  Allergen Reactions   Fenofibrate Nausea And Vomiting   Pravastatin Sodium Nausea And Vomiting   Sulfonamide Derivatives Other (See Comments)    unknown   Ciprofloxacin Rash    Chief Complaint  Patient presents with   Medical Management of Chronic Issues                            Generalized osteoarthritis of multiple sites:Age related osteoporosis without current pathological fracture: Stage 3a chronic kidney disease:  Normocytic anemia    HPI:  She is a 81 year old long term resident of this facility being seen for the management of her chronic illnesses: Generalized osteoarthritis of multiple sites:Age related osteoporosis without current pathological fracture: Stage 3a chronic kidney disease:  Normocytic anemia. There are no reports of uncontrolled pain. She continues to propel self around facility. No reports of anxiety or agitation.   Past Medical History:  Diagnosis Date   Abnormality of gait 06/06/2014   Allergic rhinitis    Anemia    Ankle fracture, right    Anxiety    Arm fracture, left    Arthritis    abnormal gait    COPD (chronic obstructive pulmonary disease) (HCC)    chronic CO2 retention, decreased DLCO    Cystic acne    adult    Degenerative disc disease, cervical    syrinx C3-7   Degenerative disc disease, lumbar    L5 nerve impingement    Dementia (HCC)    Depression    DNR (do not resuscitate)    Fracture of right proximal fibula 07/13/18 09/05/2018   GERD (gastroesophageal reflux disease)    Heme positive stool 10/10/2017   History of colon surgery    right hemicolectomy for high-grade adenomas in right colon 2013   Hyperglycemia    Hypertension    Low back pain    Mediastinal lymphadenopathy 1/9   resolving    Memory difficulty 09/20/2014   mild dementia   Onychomycosis    OSA on CPAP    Sleep apnea    stop bang  score 6    Past Surgical History:  Procedure Laterality Date   ABDOMINAL HYSTERECTOMY  1976   secondary to bleeding    APPENDECTOMY     COLON RESECTION  08/18/2012   Procedure: HAND ASSISTED LAPAROSCOPIC COLON RESECTION;  Surgeon: Jamesetta So, MD;  Location: [facility];  Service: General;  Laterality: N/A;   COLONOSCOPY  07/20/2012   Dr. Gala Romney: multiple colonic polyps with large polyps on right side, s/p saline-assisted debulking piecemeal polypectomy and ablation. Not all removed. Path with tubulovillous adenomas, high grade dysplasia.    COLONOSCOPY N/A 03/09/2013   [id number] polyps-tubular adenomas. S/p right hemicolectomy. next tcs 02/2018   epidural steroids     ESOPHAGOGASTRODUODENOSCOPY (EGD) WITH ESOPHAGEAL DILATION N/A 02/06/2014   Procedure: ESOPHAGOGASTRODUODENOSCOPY (EGD) WITH ESOPHAGEAL DILATION;  Surgeon: Daneil Dolin, MD;  Location: [facility];  Service: Endoscopy;  Laterality: N/A;  9:30   OOPHORECTOMY  1976   ORIF ANKLE FRACTURE  01/18/2012   Procedure: OPEN REDUCTION INTERNAL FIXATION (ORIF) ANKLE FRACTURE;  Surgeon: Arther Abbott, MD;  Location: [facility];  Service: Orthopedics;  Laterality: Left;   ORIF ANKLE FRACTURE Right 01/13/2016   Procedure: OPEN REDUCTION INTERNAL FIXATION (ORIF) ANKLE FRACTURE;  Surgeon: Tim Lair  Aline Brochure, MD;  Location: [facility];  Service: Orthopedics;  Laterality: Right;    Social History   Socioeconomic History   Marital status: Widowed    Spouse name: Not on file   Number of children: 4   Years of education: college 1   Highest education level: Not on file  Occupational History   Occupation: employed in the tobacco industry     Employer: RETIRED   Occupation: retired  Tobacco Use   Smoking status: Former    Packs/day: 1.50    Years: 40.00    Total pack years: 60.00    Types: Cigarettes    Quit date: 06/21/2006    Years since quitting: 16.3   Smokeless tobacco: Never  Vaping Use   Vaping Use: Never used  Substance and Sexual  Activity   Alcohol use: No   Drug use: No   Sexual activity: Not Currently  Other Topics Concern   Not on file  Social History Narrative   Long term resident of [facility]    Social Determinants of Health   Financial Resource Strain: [place]  (12/02/2020)   Overall Financial Resource Strain (CARDIA)    Difficulty of Paying Living Expenses: Not hard at all  Food Insecurity: No Food Insecurity (12/02/2020)   Hunger Vital Sign    Worried About Running Out of Food in the Last Year: Never true    [place] in the Last Year: Never true  Transportation Needs: No Transportation Needs (12/02/2020)   PRAPARE - Hydrologist (Medical): No    Lack of Transportation (Non-Medical): No  Physical Activity: Inactive (12/02/2020)   Exercise Vital Sign    Days of Exercise per Week: 0 days    Minutes of Exercise per Session: 0 min  Stress: No Stress Concern Present (12/02/2020)   [place]    Feeling of Stress : Not at all  Social Connections: Socially Isolated (12/02/2020)   Social Connection and Isolation Panel [NHANES]    Frequency of Communication with Friends and Family: Three times a week    Frequency of Social Gatherings with Friends and Family: Once a week    Attends Religious Services: Never    Marine scientist or Organizations: No    Attends Archivist Meetings: Never    Marital Status: Widowed  Intimate Partner Violence: Not At Risk (12/02/2020)   Humiliation, Afraid, Rape, and Kick questionnaire    Fear of Current or Ex-Partner: No    Emotionally Abused: No    Physically Abused: No    Sexually Abused: No   Family History  Problem Relation Age of Onset   Stomach cancer Father    Cancer Father    Cancer Mother        brain    Breast cancer Mother    Arthritis Other       VITAL SIGNS BP 116/73   Pulse 84   Temp (!) 97.3 F (36.3 C)   Resp 20   Ht '6\' 1"'$  (1.854 m)    Wt 165 lb (74.8 kg)   SpO2 97%   BMI 21.77 kg/m   Outpatient Encounter Medications as of 10/12/2022  Medication Sig   acetaminophen (TYLENOL) 325 MG tablet Take 650 mg by mouth every 8 (eight) hours as needed for moderate pain.   albuterol (VENTOLIN HFA) 108 (90 Base) MCG/ACT inhaler Inhale 2 puffs into the lungs every 6 (six) hours as  needed for wheezing or shortness of breath.   alendronate (FOSAMAX) 70 MG tablet Take 70 mg by mouth every Sunday. Take on an empty stomach at least 30 mns prior to food. Sit upright x 78ms after taking meds.   atorvastatin (LIPITOR) 10 MG tablet TAKE 1 TABLET EVERY DAY   Calcium Carb-Cholecalciferol (CALCIUM 600+D) 600-10 MG-MCG TABS Take 1 tablet by mouth daily.   cholecalciferol (VITAMIN D3) 25 MCG (1000 UNIT) tablet Take 1,000 Units by mouth daily.   donepezil (ARICEPT) 10 MG tablet TAKE 1 TABLET AT BEDTIME   Lactase (LACTAID FAST ACT) 9000 units TABS Take 9,000 Units by mouth as needed (prior to dairy products).   loperamide (IMODIUM A-D) 2 MG tablet Take 2-4 mg by mouth as needed for diarrhea or loose stools. Take 4 mg after first loose stool and then 2 mg after each loose stool, max 8 tabs per 24 hours   loratadine (CLARITIN) 10 MG tablet Take 10 mg by mouth daily.   mirabegron ER (MYRBETRIQ) 50 MG TB24 tablet Take 50 mg by mouth daily.   NON FORMULARY Diet: Regular diet with chopped meats and gravy to all meats.   Nutritional Supplements (ENSURE CLEAR) LIQD Take 1 Can by mouth 2 (two) times daily between meals. Due to weight loss   omeprazole (PRILOSEC) 40 MG capsule TAKE 1 CAPSULE EVERY DAY   OXYGEN Inhale 2 L into the lungs continuous.   potassium chloride SA (KLOR-CON M) 20 MEQ tablet Take 20 mEq by mouth daily.   STRIVERDI RESPIMAT 2.5 MCG/ACT AERS INHALE 2 PUFFS ONE TIME DAILY AT THE SAME TIME   verapamil (CALAN-SR) 180 MG CR tablet Take 180 mg by mouth daily.   No facility-administered encounter medications on file as of 10/12/2022.      SIGNIFICANT DIAGNOSTIC EXAMS  LABS REVIEWED PREVIOUS  12-03-21: wbc 3.6; hgb 10.3; hct 33.9; mcv 88.3 plt 220; glucose 87; bun 19; creat 0.92; k+ 4.1; na++ 140; ca 8.9; >60; total protein 6.1 albumin 3.6 01-04-22: wbc 5.8; hgb 10.0; hct 32.9; mcv 89.2 plt 188; glucose 78; bun 0.88; creat 0.88; k+ 3.8; na++ 136; ca 9.2; GFR >60 d-dimer 0.54; CRP 1.6  01-04-22 (ED) wbc10.6; hgb 12.0; hct 40.6; mcv89.8 plt 229; glucose 160; bun 26; creat 1.24; k+ 4.0; na++ 136; ca 9.0; GFR 44; protein 7.4 albumin 4.0 01-08-31: wbc 14.6; hgb 9.5; hct 31.9; mcv 86.4 plt 219; glucose 118; bun 17; creat 0.62; k+ 3.5; na++ 139; ca 8.2; GFR>60 protein 6.6; albumin 3.1; mag 2.0; CRP 12.2; d-dimer 0.77  05-21-22: wbc 4.8; hgb 11.; hct 37.5; mcv 87.2 plt 229; glucose 74; bun 26; creat 1.05 ;k+ 3.8; na++ 140; ca 9.2; gfr 54  06-10-22: vitamin D 39.61  NO NEW LABS.     Review of Systems  Constitutional:  Negative for malaise/fatigue.  Respiratory:  Negative for cough and shortness of breath.   Cardiovascular:  Negative for chest pain, palpitations and leg swelling.  Gastrointestinal:  Negative for abdominal pain, constipation and heartburn.  Musculoskeletal:  Negative for back pain, joint pain and myalgias.  Skin: Negative.   Neurological:  Negative for dizziness.  Psychiatric/Behavioral:  The patient is not nervous/anxious.    Physical Exam Constitutional:      General: She is not in acute distress.    Appearance: She is well-developed. She is not diaphoretic.  Neck:     Thyroid: No thyromegaly.  Cardiovascular:     Rate and Rhythm: Normal rate and regular rhythm.  Pulses: Normal pulses.     Heart sounds: Murmur heard.  Pulmonary:     Effort: Pulmonary effort is normal. No respiratory distress.     Breath sounds: Normal breath sounds.     Comments: 02 Abdominal:     General: Bowel sounds are normal. There is no distension.     Palpations: Abdomen is soft.     Tenderness: There is no abdominal  tenderness.  Musculoskeletal:        General: Normal range of motion.     Cervical back: Neck supple.     Right lower leg: No edema.     Left lower leg: No edema.  Lymphadenopathy:     Cervical: No cervical adenopathy.  Skin:    General: Skin is warm and dry.  Neurological:     Mental Status: She is alert. Mental status is at baseline.  Psychiatric:        Mood and Affect: Mood normal.      ASSESSMENT/ PLAN:  TODAY  Generalized osteoarthritis of multiple sites: has prn tylenol no reports of uncontrolled pain   2. Age related osteoporosis without current pathological fracture: will continue fosamax 70 mg weekly   3. Stage 3a chronic kidney disease: bun 17; creat 0.62; gfr >60  4. Normocytic anemia: hgb 9.5   PREVIOUS   5. Dysphagia oropharyngeal phase: no indications of aspiration present.   6. Vitamin D deficiency: will continue vitamin D 1000 units   7. Hyperlipidemia LDL goal <100: LDL 65 will continue lipitor 10 mg daily   8. Low thyroid stimulating hormone (tsh) tsh 1.075 free t4: 0.93 will monitor   9. 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 Urinary frequency: will continue myrbetriq 50 mg daily   11. Chronic respiratory failure with hypoxia and hypercapnia COPD is on 02; will continue albuterol 2 puffs every 4 hours as needed; strivirdi 2.5 mcg 2 puffs daily claritin 10 mg daily   12. Essential hypertension b/p 116/73 will continue calan sr 180 mg daily   13. Esophageal reflux disease without esophagitis: will continue prilosec 40 mg daily   14. 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

## 2022-10-13 DIAGNOSIS — Z9181 History of falling: Secondary | ICD-10-CM | POA: Diagnosis not present

## 2022-10-13 DIAGNOSIS — J449 Chronic obstructive pulmonary disease, unspecified: Secondary | ICD-10-CM | POA: Diagnosis not present

## 2022-10-13 DIAGNOSIS — M6281 Muscle weakness (generalized): Secondary | ICD-10-CM | POA: Diagnosis not present

## 2022-10-13 DIAGNOSIS — M159 Polyosteoarthritis, unspecified: Secondary | ICD-10-CM | POA: Diagnosis not present

## 2022-10-13 DIAGNOSIS — R262 Difficulty in walking, not elsewhere classified: Secondary | ICD-10-CM | POA: Diagnosis not present

## 2022-10-14 DIAGNOSIS — M159 Polyosteoarthritis, unspecified: Secondary | ICD-10-CM | POA: Diagnosis not present

## 2022-10-14 DIAGNOSIS — J449 Chronic obstructive pulmonary disease, unspecified: Secondary | ICD-10-CM | POA: Diagnosis not present

## 2022-10-14 DIAGNOSIS — R262 Difficulty in walking, not elsewhere classified: Secondary | ICD-10-CM | POA: Diagnosis not present

## 2022-10-14 DIAGNOSIS — M6281 Muscle weakness (generalized): Secondary | ICD-10-CM | POA: Diagnosis not present

## 2022-10-14 DIAGNOSIS — Z9181 History of falling: Secondary | ICD-10-CM | POA: Diagnosis not present

## 2022-10-15 DIAGNOSIS — Z9181 History of falling: Secondary | ICD-10-CM | POA: Diagnosis not present

## 2022-10-15 DIAGNOSIS — R262 Difficulty in walking, not elsewhere classified: Secondary | ICD-10-CM | POA: Diagnosis not present

## 2022-10-15 DIAGNOSIS — M159 Polyosteoarthritis, unspecified: Secondary | ICD-10-CM | POA: Diagnosis not present

## 2022-10-15 DIAGNOSIS — J449 Chronic obstructive pulmonary disease, unspecified: Secondary | ICD-10-CM | POA: Diagnosis not present

## 2022-10-15 DIAGNOSIS — M6281 Muscle weakness (generalized): Secondary | ICD-10-CM | POA: Diagnosis not present

## 2022-10-19 DIAGNOSIS — M159 Polyosteoarthritis, unspecified: Secondary | ICD-10-CM | POA: Diagnosis not present

## 2022-10-19 DIAGNOSIS — J449 Chronic obstructive pulmonary disease, unspecified: Secondary | ICD-10-CM | POA: Diagnosis not present

## 2022-10-19 DIAGNOSIS — R262 Difficulty in walking, not elsewhere classified: Secondary | ICD-10-CM | POA: Diagnosis not present

## 2022-10-19 DIAGNOSIS — M6281 Muscle weakness (generalized): Secondary | ICD-10-CM | POA: Diagnosis not present

## 2022-10-19 DIAGNOSIS — Z9181 History of falling: Secondary | ICD-10-CM | POA: Diagnosis not present

## 2022-10-20 ENCOUNTER — Non-Acute Institutional Stay (SKILLED_NURSING_FACILITY): Payer: Medicare PPO | Admitting: Adult Health

## 2022-10-20 ENCOUNTER — Other Ambulatory Visit (HOSPITAL_COMMUNITY)
Admission: RE | Admit: 2022-10-20 | Discharge: 2022-10-20 | Disposition: A | Discharge status: Home / Self Care | Payer: Medicare PPO | Source: Skilled Nursing Facility | Attending: Adult Health | Admitting: Adult Health

## 2022-10-20 ENCOUNTER — Encounter: Payer: Self-pay | Admitting: Adult Health

## 2022-10-20 DIAGNOSIS — R918 Other nonspecific abnormal finding of lung field: Secondary | ICD-10-CM | POA: Diagnosis not present

## 2022-10-20 DIAGNOSIS — J449 Chronic obstructive pulmonary disease, unspecified: Secondary | ICD-10-CM | POA: Diagnosis not present

## 2022-10-20 DIAGNOSIS — Z9181 History of falling: Secondary | ICD-10-CM | POA: Diagnosis not present

## 2022-10-20 DIAGNOSIS — J189 Pneumonia, unspecified organism: Secondary | ICD-10-CM | POA: Diagnosis not present

## 2022-10-20 DIAGNOSIS — M159 Polyosteoarthritis, unspecified: Secondary | ICD-10-CM | POA: Diagnosis not present

## 2022-10-20 DIAGNOSIS — R4182 Altered mental status, unspecified: Secondary | ICD-10-CM | POA: Diagnosis not present

## 2022-10-20 DIAGNOSIS — M6281 Muscle weakness (generalized): Secondary | ICD-10-CM | POA: Diagnosis not present

## 2022-10-20 DIAGNOSIS — R262 Difficulty in walking, not elsewhere classified: Secondary | ICD-10-CM | POA: Diagnosis not present

## 2022-10-20 LAB — CBC WITH DIFFERENTIAL/PLATELET
Abs Immature Granulocytes: 0.07 10*3/uL (ref 0.00–0.07)
Basophils Absolute: 0 10*3/uL (ref 0.0–0.1)
Basophils Relative: 0 %
Eosinophils Absolute: 0 10*3/uL (ref 0.0–0.5)
Eosinophils Relative: 0 %
HCT: 32 % — ABNORMAL LOW (ref 36.0–46.0)
Hemoglobin: 9.7 g/dL — ABNORMAL LOW (ref 12.0–15.0)
Immature Granulocytes: 1 %
Lymphocytes Relative: 6 %
Lymphs Abs: 0.8 10*3/uL (ref 0.7–4.0)
MCH: 26.1 pg (ref 26.0–34.0)
MCHC: 30.3 g/dL (ref 30.0–36.0)
MCV: 86.3 fL (ref 80.0–100.0)
Monocytes Absolute: 1.1 10*3/uL — ABNORMAL HIGH (ref 0.1–1.0)
Monocytes Relative: 8 %
Neutro Abs: 11.6 10*3/uL — ABNORMAL HIGH (ref 1.7–7.7)
Neutrophils Relative %: 85 %
Platelets: 205 10*3/uL (ref 150–400)
RBC: 3.71 MIL/uL — ABNORMAL LOW (ref 3.87–5.11)
RDW: 14.6 % (ref 11.5–15.5)
WBC: 13.6 10*3/uL — ABNORMAL HIGH (ref 4.0–10.5)
nRBC: 0 % (ref 0.0–0.2)

## 2022-10-20 LAB — BASIC METABOLIC PANEL
Anion gap: 7 (ref 5–15)
BUN: 25 mg/dL — ABNORMAL HIGH (ref 8–23)
CO2: 26 mmol/L (ref 22–32)
Calcium: 8.5 mg/dL — ABNORMAL LOW (ref 8.9–10.3)
Chloride: 106 mmol/L (ref 98–111)
Creatinine, Ser: 1.2 mg/dL — ABNORMAL HIGH (ref 0.44–1.00)
GFR, Estimated: 45 mL/min — ABNORMAL LOW (ref >60)
Glucose, Bld: 90 mg/dL (ref 70–99)
Potassium: 3.7 mmol/L (ref 3.5–5.1)
Sodium: 139 mmol/L (ref 135–145)

## 2022-10-20 NOTE — Progress Notes (Unsigned)
Location:  [place] Room Number: [phone] Place of Service:  SNF (31)   CODE STATUS: DNR  Allergies  Allergen Reactions   Fenofibrate Nausea And Vomiting   Pravastatin Sodium Nausea And Vomiting   Sulfonamide Derivatives Other (See Comments)    unknown   Ciprofloxacin Rash    Chief Complaint  Patient presents with   Acute Visit    Changes in Status    HPI:  Over this past weekend she has developed a cough. She was treated with robitussin per standing orders without relief. Today she has increased weakness; worsening shortness of breath; and cough. There are no reports of fevers present; however she is sweating. She does have a history of COPD.   Past Medical History:  Diagnosis Date   Abnormality of gait 06/06/2014   Allergic rhinitis    Anemia    Ankle fracture, right    Anxiety    Arm fracture, left    Arthritis    abnormal gait    COPD (chronic obstructive pulmonary disease) (HCC)    chronic CO2 retention, decreased DLCO    Cystic acne    adult    Degenerative disc disease, cervical    syrinx C3-7   Degenerative disc disease, lumbar    L5 nerve impingement    Dementia (HCC)    Depression    DNR (do not resuscitate)    Fracture of right proximal fibula 07/13/18 09/05/2018   GERD (gastroesophageal reflux disease)    Heme positive stool 10/10/2017   History of colon surgery    right hemicolectomy for high-grade adenomas in right colon 2013   Hyperglycemia    Hypertension    Low back pain    Mediastinal lymphadenopathy 1/9   resolving    Memory difficulty 09/20/2014   mild dementia   Onychomycosis    OSA on CPAP    Sleep apnea    stop bang score 6    Past Surgical History:  Procedure Laterality Date   ABDOMINAL HYSTERECTOMY  1976   secondary to bleeding    APPENDECTOMY     COLON RESECTION  08/18/2012   Procedure: HAND ASSISTED LAPAROSCOPIC COLON RESECTION;  Surgeon: Jamesetta So, MD;  Location: [facility];  Service: General;   Laterality: N/A;   COLONOSCOPY  07/20/2012   Dr. Gala Romney: multiple colonic polyps with large polyps on right side, s/p saline-assisted debulking piecemeal polypectomy and ablation. Not all removed. Path with tubulovillous adenomas, high grade dysplasia.    COLONOSCOPY N/A 03/09/2013   [id number] polyps-tubular adenomas. S/p right hemicolectomy. next tcs 02/2018   epidural steroids     ESOPHAGOGASTRODUODENOSCOPY (EGD) WITH ESOPHAGEAL DILATION N/A 02/06/2014   Procedure: ESOPHAGOGASTRODUODENOSCOPY (EGD) WITH ESOPHAGEAL DILATION;  Surgeon: Daneil Dolin, MD;  [facility];  Service: Endoscopy;  Laterality: N/A;  9:30   OOPHORECTOMY  1976   ORIF ANKLE FRACTURE  01/18/2012   Procedure: OPEN REDUCTION INTERNAL FIXATION (ORIF) ANKLE FRACTURE;  Surgeon: Arther Abbott, MD;  Location: [facility];  Service: Orthopedics;  Laterality: Left;   ORIF ANKLE FRACTURE Right 01/13/2016   Procedure: OPEN REDUCTION INTERNAL FIXATION (ORIF) ANKLE FRACTURE;  Surgeon: Carole Civil, MD;  Location: [facility];  Service: Orthopedics;  Laterality: Right;    Social History   Socioeconomic History   Marital status: Widowed    Spouse name: Not on file   Number of children: 4   Years of education: college 1   Highest education level: Not on file  Occupational  History   Occupation: employed in the [employer]ist, hospital: RETIRED   Occupation: retired  Tobacco Use   Smoking status: Former    Packs/day: 1.50    Years: 40.00    Total pack years: 60.00    Types: Cigarettes    Quit date: 06/21/2006    Years since quitting: 16.3   Smokeless tobacco: Never  Vaping Use   Vaping Use: Never used  Substance and Sexual Activity   Alcohol use: No   Drug use: No   Sexual activity: Not Currently  Other Topics Concern   Not on file  Social History Narrative   Long term resident of [facility]    Social Determinants of Health   Financial Resource Strain: Low Risk  (12/02/2020)   Overall Financial Resource Strain  (CARDIA)    Difficulty of Paying Living Expenses: Not hard at all  Food Insecurity: No Food Insecurity (12/02/2020)   Hunger Vital Sign    Worried About Running Out of Food in the Last Year: Never true    Ran Out of Food in the Last Year: Never true  Transportation Needs: No Transportation Needs (12/02/2020)   PRAPARE - Hydrologist (Medical): No    Lack of Transportation (Non-Medical): No  Physical Activity: Inactive (12/02/2020)   Exercise Vital Sign    Days of Exercise per Week: 0 days    Minutes of Exercise per Session: 0 min  Stress: No Stress Concern Present (12/02/2020)   [place]    Feeling of Stress : Not at all  Social Connections: Socially Isolated (12/02/2020)   Social Connection and Isolation Panel [NHANES]    Frequency of Communication with Friends and Family: Three times a week    Frequency of Social Gatherings with Friends and Family: Once a week    Attends Religious Services: Never    Marine scientist or Organizations: No    Attends Archivist Meetings: Never    Marital Status: Widowed  Intimate Partner Violence: Not At Risk (12/02/2020)   Humiliation, Afraid, Rape, and Kick questionnaire    Fear of Current or Ex-Partner: No    Emotionally Abused: No    Physically Abused: No    Sexually Abused: No   Family History  Problem Relation Age of Onset   Stomach cancer Father    Cancer Father    Cancer Mother        brain    Breast cancer Mother    Arthritis Other       VITAL SIGNS BP 138/78   Pulse 89   Temp 98.4 F (36.9 C)   Resp 20   Ht '6\' 1"'$  (1.854 m)   Wt 165 lb 6.4 oz (75 kg)   SpO2 98%   BMI 21.82 kg/m   Outpatient Encounter Medications as of 10/20/2022  Medication Sig   alendronate (FOSAMAX) 70 MG tablet Take 70 mg by mouth every Sunday. Take on an empty stomach at least 30 mns prior to food. Sit upright x 19ms after taking meds.    atorvastatin (LIPITOR) 10 MG tablet TAKE 1 TABLET EVERY DAY   azithromycin (ZITHROMAX) 500 MG tablet Take 500 mg by mouth daily.   Calcium Carb-Cholecalciferol (CALCIUM 600+D) 600-10 MG-MCG TABS Take 1 tablet by mouth daily.   cholecalciferol (VITAMIN D3) 25 MCG (1000 UNIT) tablet Take 1,000 Units by mouth daily.   donepezil (ARICEPT) 10 MG tablet TAKE 1  TABLET AT BEDTIME   doxycycline (VIBRAMYCIN) 100 MG capsule Take 100 mg by mouth 2 (two) times daily.   ipratropium-albuterol (DUONEB) 0.5-2.5 (3) MG/3ML SOLN Take 3 mLs by nebulization every 6 (six) hours as needed.   loratadine (CLARITIN) 10 MG tablet Take 10 mg by mouth daily.   mirabegron ER (MYRBETRIQ) 50 MG TB24 tablet Take 50 mg by mouth daily.   NON FORMULARY Diet: Regular diet with chopped meats and gravy to all meats.   Nutritional Supplements (ENSURE CLEAR) LIQD Take 1 Can by mouth 2 (two) times daily between meals. Due to weight loss   omeprazole (PRILOSEC) 40 MG capsule TAKE 1 CAPSULE EVERY DAY   OXYGEN Inhale 2 L into the lungs continuous.   potassium chloride SA (KLOR-CON M) 20 MEQ tablet Take 20 mEq by mouth daily.   saccharomyces boulardii (FLORASTOR) 250 MG capsule Take 250 mg by mouth 2 (two) times daily.   STRIVERDI RESPIMAT 2.5 MCG/ACT AERS INHALE 2 PUFFS ONE TIME DAILY AT THE SAME TIME   verapamil (CALAN-SR) 180 MG CR tablet Take 180 mg by mouth daily.   [DISCONTINUED] acetaminophen (TYLENOL) 325 MG tablet Take 650 mg by mouth every 8 (eight) hours as needed for moderate pain.   [DISCONTINUED] albuterol (VENTOLIN HFA) 108 (90 Base) MCG/ACT inhaler Inhale 2 puffs into the lungs every 6 (six) hours as needed for wheezing or shortness of breath.   [DISCONTINUED] Lactase (LACTAID FAST ACT) 9000 units TABS Take 9,000 Units by mouth as needed (prior to dairy products).   [DISCONTINUED] loperamide (IMODIUM A-D) 2 MG tablet Take 2-4 mg by mouth as needed for diarrhea or loose stools. Take 4 mg after first loose stool and then 2 mg  after each loose stool, max 8 tabs per 24 hours   No facility-administered encounter medications on file as of 10/20/2022.     SIGNIFICANT DIAGNOSTIC EXAMS  TODAY  10-20-22: chest x-ray:  Early changes of infrahilar atelectasis or pneumonia. Findings favor atelectasis in the absence of leukocytosis   LABS REVIEWED PREVIOUS  12-03-21: wbc 3.6; hgb 10.3; hct 33.9; mcv 88.3 plt 220; glucose 87; bun 19; creat 0.92; k+ 4.1; na++ 140; ca 8.9; >60; total protein 6.1 albumin 3.6 01-04-22: wbc 5.8; hgb 10.0; hct 32.9; mcv 89.2 plt 188; glucose 78; bun 0.88; creat 0.88; k+ 3.8; na++ 136; ca 9.2; GFR >60 d-dimer 0.54; CRP 1.6  01-04-22 (ED) wbc10.6; hgb 12.0; hct 40.6; mcv89.8 plt 229; glucose 160; bun 26; creat 1.24; k+ 4.0; na++ 136; ca 9.0; GFR 44; protein 7.4 albumin 4.0 01-08-31: wbc 14.6; hgb 9.5; hct 31.9; mcv 86.4 plt 219; glucose 118; bun 17; creat 0.62; k+ 3.5; na++ 139; ca 8.2; GFR>60 protein 6.6; albumin 3.1; mag 2.0; CRP 12.2; d-dimer 0.77  05-21-22: wbc 4.8; hgb 11.; hct 37.5; mcv 87.2 plt 229; glucose 74; bun 26; creat 1.05 ;k+ 3.8; na++ 140; ca 9.2; gfr 54  06-10-22: vitamin D 39.61  TODAY  10-20-22: wbc 13.6; hgb 9.7; hct 32.0; mcv 86.3 plt 205; glucose 90; bun 25; creat 1.20; k+ 3.7; na++ 139; ca 8.5 gfr 45      Review of Systems  Constitutional:  Negative for malaise/fatigue.  Respiratory:  Positive for cough, shortness of breath and wheezing.   Cardiovascular:  Negative for chest pain, palpitations and leg swelling.  Gastrointestinal:  Negative for abdominal pain, constipation and heartburn.  Musculoskeletal:  Negative for back pain, joint pain and myalgias.  Skin: Negative.   Neurological:  Negative for dizziness.  Psychiatric/Behavioral:  The patient is not nervous/anxious.    Physical Exam Constitutional:      General: She is not in acute distress.    Appearance: She is well-developed. She is not diaphoretic.  Neck:     Thyroid: No thyromegaly.  Cardiovascular:     Rate  and Rhythm: Normal rate and regular rhythm.     Pulses: Normal pulses.     Heart sounds: Normal heart sounds.  Pulmonary:     Effort: Pulmonary effort is normal. No respiratory distress.     Breath sounds: Wheezing and rhonchi present.     Comments: On right side  Abdominal:     General: Bowel sounds are normal. There is no distension.     Palpations: Abdomen is soft.     Tenderness: There is no abdominal tenderness.  Musculoskeletal:        General: Normal range of motion.     Cervical back: Neck supple.     Right lower leg: No edema.     Left lower leg: No edema.  Lymphadenopathy:     Cervical: No cervical adenopathy.  Skin:    General: Skin is warm and dry.  Neurological:     Mental Status: She is alert. Mental status is at baseline.  Psychiatric:        Mood and Affect: Mood normal.        ASSESSMENT/ PLAN:  TODAY  HCAP (health associated pneumonia) COPD with hypoxia:   Will begin zithromax 500 mg daily through 10-26-22; and doxycycline 100 mg twice daily through 10-26-22 Will start florastor twice daily through 11-01-22 Will begin duonebe every 6 hours through 10-26-22 The following have been obtained: cxr; cbc with diff and bmp    Ok Edwards NP [facility]  call [phone]

## 2022-10-21 DIAGNOSIS — J189 Pneumonia, unspecified organism: Secondary | ICD-10-CM | POA: Insufficient documentation

## 2022-10-21 DIAGNOSIS — M6281 Muscle weakness (generalized): Secondary | ICD-10-CM | POA: Diagnosis not present

## 2022-10-21 DIAGNOSIS — Z9181 History of falling: Secondary | ICD-10-CM | POA: Diagnosis not present

## 2022-10-21 DIAGNOSIS — J449 Chronic obstructive pulmonary disease, unspecified: Secondary | ICD-10-CM | POA: Diagnosis not present

## 2022-10-21 DIAGNOSIS — M159 Polyosteoarthritis, unspecified: Secondary | ICD-10-CM | POA: Diagnosis not present

## 2022-10-21 DIAGNOSIS — R262 Difficulty in walking, not elsewhere classified: Secondary | ICD-10-CM | POA: Diagnosis not present

## 2022-10-22 DIAGNOSIS — M6281 Muscle weakness (generalized): Secondary | ICD-10-CM | POA: Diagnosis not present

## 2022-10-22 DIAGNOSIS — R262 Difficulty in walking, not elsewhere classified: Secondary | ICD-10-CM | POA: Diagnosis not present

## 2022-10-22 DIAGNOSIS — M159 Polyosteoarthritis, unspecified: Secondary | ICD-10-CM | POA: Diagnosis not present

## 2022-10-22 DIAGNOSIS — J449 Chronic obstructive pulmonary disease, unspecified: Secondary | ICD-10-CM | POA: Diagnosis not present

## 2022-10-22 DIAGNOSIS — Z9181 History of falling: Secondary | ICD-10-CM | POA: Diagnosis not present

## 2022-10-23 DIAGNOSIS — J449 Chronic obstructive pulmonary disease, unspecified: Secondary | ICD-10-CM | POA: Diagnosis not present

## 2022-10-23 DIAGNOSIS — M6281 Muscle weakness (generalized): Secondary | ICD-10-CM | POA: Diagnosis not present

## 2022-10-23 DIAGNOSIS — R262 Difficulty in walking, not elsewhere classified: Secondary | ICD-10-CM | POA: Diagnosis not present

## 2022-10-23 DIAGNOSIS — Z9181 History of falling: Secondary | ICD-10-CM | POA: Diagnosis not present

## 2022-10-23 DIAGNOSIS — M159 Polyosteoarthritis, unspecified: Secondary | ICD-10-CM | POA: Diagnosis not present

## 2022-10-25 ENCOUNTER — Encounter: Payer: Self-pay | Admitting: Internal Medicine

## 2022-10-25 ENCOUNTER — Non-Acute Institutional Stay (SKILLED_NURSING_FACILITY): Payer: Medicare PPO | Admitting: Internal Medicine

## 2022-10-25 DIAGNOSIS — J189 Pneumonia, unspecified organism: Secondary | ICD-10-CM

## 2022-10-25 DIAGNOSIS — F0150 Vascular dementia without behavioral disturbance: Secondary | ICD-10-CM

## 2022-10-25 DIAGNOSIS — M159 Polyosteoarthritis, unspecified: Secondary | ICD-10-CM | POA: Diagnosis not present

## 2022-10-25 DIAGNOSIS — Z9181 History of falling: Secondary | ICD-10-CM | POA: Diagnosis not present

## 2022-10-25 DIAGNOSIS — W19XXXA Unspecified fall, initial encounter: Secondary | ICD-10-CM

## 2022-10-25 DIAGNOSIS — M542 Cervicalgia: Secondary | ICD-10-CM | POA: Diagnosis not present

## 2022-10-25 DIAGNOSIS — R262 Difficulty in walking, not elsewhere classified: Secondary | ICD-10-CM | POA: Diagnosis not present

## 2022-10-25 DIAGNOSIS — M6281 Muscle weakness (generalized): Secondary | ICD-10-CM | POA: Diagnosis not present

## 2022-10-25 DIAGNOSIS — J449 Chronic obstructive pulmonary disease, unspecified: Secondary | ICD-10-CM | POA: Diagnosis not present

## 2022-10-25 NOTE — Progress Notes (Signed)
   NURSING HOME LOCATION:  [facility] ROOM NUMBER:  82 W  CODE STATUS:  DNR  PCP:  Ok Edwards NP  This is a nursing facility follow up visit of chronic medical diagnoses & to document compliance with Regulation 483.30 (c) in The [place] Manual Phase 2 which mandates caregiver visit ( visits can alternate among physician, PA or NP as per statutes) within 10 days of 30 days / 60 days/ 90 days post admission to SNF date    Interim medical record and care since last SNF visit was updated with review of diagnostic studies and change in clinical status since last visit were documented.  HPI: She is a permanent resident of this facility with medical diagnoses of COPD, GERD, essential hypertension, OSA on CPAP, and dementia. On 10/20/2022 doxycycline was initiated for HCAP.  I personally reviewed that chest x-ray & verified right lower lobe infiltrate.  Labs performed 12/6 revealed a white count of 13,600 with a left shift.  Normochromic, normocytic anemia was present with H/H of 9.7/32, decreased from prior values of 11.1/37.5.  Creatinine was 1.20 and GFR 45 indicating low stage IIIa CKD.  This represented slight progression of CKD. Over the weekend staff reported that she fell and was complaining of neck pain.  Imaging was performed  Review of systems: Dementia invalidated responses.  Responses were somewhat nonsensical. She stated that she felt "drawn."  She then went on to say "it pull about my life."  I had her to repeat this , telling her I did not understand what she meant.  She did validate "I have to lay down."  When asked about shortness of breath her response was "little bit."  Physical exam:  Pertinent or positive findings: She was lying in bed without increased work of breathing.  Her facies appear somewhat perplexed.  There was a slight tremor of her head.  She is wearing nasal oxygen.  She has complete dentures.  Pulse rate was rechecked and was 94 with  1 premature beat.  Chest was surprisingly clear.  Bowel sounds were hyperactive.  Dorsalis pedis pulses are stronger than posterior tibial pulses.  She has nonpitting edema of the feet.  Interosseous wasting of the hands is noted.  She is wearing an antiwandering bracelet at the ankle.  Passive range of motion of the neck revealed some decrease in range of motion but no clinical evidence of any discomfort or pain.  General appearance: Adequately nourished; no acute distress, increased work of breathing is present.   Lymphatic: No lymphadenopathy about the head, neck, axilla. Eyes: No conjunctival inflammation or lid edema is present. There is no scleral icterus. Ears:  External ear exam shows no significant lesions or deformities.   Nose:  External nasal examination shows no deformity or inflammation. Nasal mucosa are pink and moist without lesions, exudates Oral exam:There is no oropharyngeal erythema or exudate. Neck:  No thyromegaly, masses, tenderness noted.    Heart:  No gallop, murmur, click, rub .  Lungs:  without wheezes, rhonchi, rales, rubs. Abdomen:  Abdomen is soft and nontender with no organomegaly, hernias, masses. GU: Deferred  Extremities:  No cyanosis, clubbing  Neurologic exam :Balance, Rhomberg, finger to nose testing could not be completed due to clinical state Skin: Warm & dry w/o tenting. No significant lesions or rash.  See summary under each active problem in the Problem List with associated updated therapeutic plan

## 2022-10-25 NOTE — Patient Instructions (Signed)
See assessment and plan under each diagnosis in the problem list and acutely for this visit 

## 2022-10-25 NOTE — Assessment & Plan Note (Signed)
She can provide no meaningful history.  No behavioral issues reported by staff.

## 2022-10-25 NOTE — Assessment & Plan Note (Addendum)
10/20/2022 mobile imaging revealed HCAP; I personally reviewed the films.  Doxycycline initiated.  She is afebrile and has adequate oxygen saturations despite COPD and OSA diagnoses.  Complete full course of doxycycline.  In absence of bronchospasm; no clinical evidence of need for steroids or change in pulmonary toilet.

## 2022-10-26 DIAGNOSIS — J449 Chronic obstructive pulmonary disease, unspecified: Secondary | ICD-10-CM | POA: Diagnosis not present

## 2022-10-26 DIAGNOSIS — Z9181 History of falling: Secondary | ICD-10-CM | POA: Diagnosis not present

## 2022-10-26 DIAGNOSIS — R262 Difficulty in walking, not elsewhere classified: Secondary | ICD-10-CM | POA: Diagnosis not present

## 2022-10-26 DIAGNOSIS — M6281 Muscle weakness (generalized): Secondary | ICD-10-CM | POA: Diagnosis not present

## 2022-10-26 DIAGNOSIS — M159 Polyosteoarthritis, unspecified: Secondary | ICD-10-CM | POA: Diagnosis not present

## 2022-10-27 ENCOUNTER — Encounter: Payer: Self-pay | Admitting: Adult Health

## 2022-10-27 ENCOUNTER — Non-Acute Institutional Stay (SKILLED_NURSING_FACILITY): Payer: Medicare PPO | Admitting: Adult Health

## 2022-10-27 DIAGNOSIS — M6281 Muscle weakness (generalized): Secondary | ICD-10-CM | POA: Diagnosis not present

## 2022-10-27 DIAGNOSIS — J449 Chronic obstructive pulmonary disease, unspecified: Secondary | ICD-10-CM | POA: Diagnosis not present

## 2022-10-27 DIAGNOSIS — Z9181 History of falling: Secondary | ICD-10-CM | POA: Diagnosis not present

## 2022-10-27 DIAGNOSIS — R262 Difficulty in walking, not elsewhere classified: Secondary | ICD-10-CM | POA: Diagnosis not present

## 2022-10-27 DIAGNOSIS — Z0000 Encounter for general adult medical examination without abnormal findings: Secondary | ICD-10-CM

## 2022-10-27 DIAGNOSIS — M159 Polyosteoarthritis, unspecified: Secondary | ICD-10-CM | POA: Diagnosis not present

## 2022-10-27 NOTE — Progress Notes (Signed)
Subjective:   [name] is a 81 y.o. female who presents for Medicare Annual (Subsequent) preventive examination.  Review of Systems    Review of Systems  Constitutional:  Negative for malaise/fatigue.  Respiratory:  Negative for cough and shortness of breath.   Cardiovascular:  Negative for chest pain, palpitations and leg swelling.  Gastrointestinal:  Negative for abdominal pain, constipation and heartburn.  Musculoskeletal:  Negative for back pain, joint pain and myalgias.  Skin: Negative.   Neurological:  Negative for dizziness.  Psychiatric/Behavioral:  The patient is not nervous/anxious.     Cardiac Risk Factors include: advanced age (>88mn, >>76women)     Objective:    Today's Vitals   10/27/22 1058  BP: 121/66  Pulse: 88  Resp: 20  Temp: 97.9 F (36.6 C)  SpO2: 94%  Weight: 165 lb 6.4 oz (75 kg)  Height: '6\' 1"'$  (1.854 m)   Body mass index is 21.82 kg/m.     10/20/2022    2:35 PM 09/03/2022   12:02 PM 07/13/2022    9:21 AM 06/11/2022    1:34 PM 06/08/2022   10:39 AM 06/03/2022   10:19 AM 05/21/2022   11:42 AM  Advanced Directives  Does Patient Have a Medical Advance Directive? Yes Yes Yes Yes Yes Yes Yes  Type of Advance Directive Out of facility DNR (pink MOST or yellow form) Out of facility DNR (pink MOST or yellow form) Out of facility DNR (pink MOST or yellow form) Out of facility DNR (pink MOST or yellow form) Out of facility DNR (pink MOST or yellow form) Out of facility DNR (pink MOST or yellow form)   Does patient want to make changes to medical advance directive? No - Patient declined No - Patient declined No - Patient declined No - Patient declined No - Patient declined No - Patient declined No - Patient declined  Pre-existing out of facility DNR order (yellow form or pink MOST form)    Yellow form placed in chart (order not valid for inpatient use) Yellow form placed in chart (order not valid for inpatient use) Yellow form placed in chart (order  not valid for inpatient use)     Current Medications (verified) Outpatient Encounter Medications as of 10/27/2022  Medication Sig   alendronate (FOSAMAX) 70 MG tablet Take 70 mg by mouth every Sunday. Take on an empty stomach at least 30 mns prior to food. Sit upright x 3101m after taking meds.   atorvastatin (LIPITOR) 10 MG tablet TAKE 1 TABLET EVERY DAY   azithromycin (ZITHROMAX) 500 MG tablet Take 500 mg by mouth daily.   Calcium Carb-Cholecalciferol (CALCIUM 600+D) 600-10 MG-MCG TABS Take 1 tablet by mouth daily.   cholecalciferol (VITAMIN D3) 25 MCG (1000 UNIT) tablet Take 1,000 Units by mouth daily.   donepezil (ARICEPT) 10 MG tablet TAKE 1 TABLET AT BEDTIME   doxycycline (VIBRAMYCIN) 100 MG capsule Take 100 mg by mouth 2 (two) times daily.   ipratropium-albuterol (DUONEB) 0.5-2.5 (3) MG/3ML SOLN Take 3 mLs by nebulization every 6 (six) hours as needed.   loratadine (CLARITIN) 10 MG tablet Take 10 mg by mouth daily.   mirabegron ER (MYRBETRIQ) 50 MG TB24 tablet Take 50 mg by mouth daily.   NON FORMULARY Diet: Regular diet with chopped meats and gravy to all meats.   Nutritional Supplements (ENSURE CLEAR) LIQD Take 1 Can by mouth 2 (two) times daily between meals. Due to weight loss   omeprazole (PRILOSEC) 40 MG capsule TAKE 1 CAPSULE  EVERY DAY   OXYGEN Inhale 2 L into the lungs continuous.   potassium chloride SA (KLOR-CON M) 20 MEQ tablet Take 20 mEq by mouth daily.   saccharomyces boulardii (FLORASTOR) 250 MG capsule Take 250 mg by mouth 2 (two) times daily.   STRIVERDI RESPIMAT 2.5 MCG/ACT AERS INHALE 2 PUFFS ONE TIME DAILY AT THE SAME TIME   verapamil (CALAN-SR) 180 MG CR tablet Take 180 mg by mouth daily.   No facility-administered encounter medications on file as of 10/27/2022.    Allergies (verified) Fenofibrate, Pravastatin sodium, Sulfonamide derivatives, and Ciprofloxacin   History: Past Medical History:  Diagnosis Date   Abnormality of gait 06/06/2014   Allergic  rhinitis    Anemia    Ankle fracture, right    Anxiety    Arm fracture, left    Arthritis    abnormal gait    COPD (chronic obstructive pulmonary disease) (HCC)    chronic CO2 retention, decreased DLCO    Cystic acne    adult    Degenerative disc disease, cervical    syrinx C3-7   Degenerative disc disease, lumbar    L5 nerve impingement    Dementia (HCC)    Depression    DNR (do not resuscitate)    Fracture of right proximal fibula 07/13/18 09/05/2018   GERD (gastroesophageal reflux disease)    Heme positive stool 10/10/2017   History of colon surgery    right hemicolectomy for high-grade adenomas in right colon 2013   Hyperglycemia    Hypertension    Low back pain    Mediastinal lymphadenopathy 1/9   resolving    Memory difficulty 09/20/2014   mild dementia   Onychomycosis    OSA on CPAP    Sleep apnea    stop bang score 6   Past Surgical History:  Procedure Laterality Date   ABDOMINAL HYSTERECTOMY  1976   secondary to bleeding    APPENDECTOMY     COLON RESECTION  08/18/2012   Procedure: HAND ASSISTED LAPAROSCOPIC COLON RESECTION;  Surgeon: Jamesetta So, MD;  Location: [facility];  Service: General;  Laterality: N/A;   COLONOSCOPY  07/20/2012   Dr. Gala Romney: multiple colonic polyps with large polyps on right side, s/p saline-assisted debulking piecemeal polypectomy and ablation. Not all removed. Path with tubulovillous adenomas, high grade dysplasia.    COLONOSCOPY N/A 03/09/2013   OM[facility] polyps-tubular adenomas. S/p right hemicolectomy. next tcs 02/2018   epidural steroids     ESOPHAGOGASTRODUODENOSCOPY (EGD) WITH ESOPHAGEAL DILATION N/A 02/06/2014   Procedure: ESOPHAGOGASTRODUODENOSCOPY (EGD) WITH ESOPHAGEAL DILATION;  Surgeon: Daneil Dolin, MD;  Location: [facility];  Service: Endoscopy;  Laterality: N/A;  9:30   OOPHORECTOMY  1976   ORIF ANKLE FRACTURE  01/18/2012   Procedure: OPEN REDUCTION INTERNAL FIXATION (ORIF) ANKLE FRACTURE;  Surgeon: Arther Abbott, MD;   Location: [facility];  Service: Orthopedics;  Laterality: Left;   ORIF ANKLE FRACTURE Right 01/13/2016   Procedure: OPEN REDUCTION INTERNAL FIXATION (ORIF) ANKLE FRACTURE;  Surgeon: Carole Civil, MD;  Location: [facility];  Service: Orthopedics;  Laterality: Right;   Family History  Problem Relation Age of Onset   Stomach cancer Father    Cancer Father    Cancer Mother        brain    Breast cancer Mother    Arthritis Other    Social History   Socioeconomic History   Marital status: Widowed    Spouse name: Not on file   Number of children:  4   Years of education: college 1   Highest education level: Not on file  Occupational History   Occupation: employed in the tobacco industry     Employer: RETIRED   Occupation: retired  Tobacco Use   Smoking status: Former    Packs/day: 1.50    Years: 40.00    Total pack years: 60.00    Types: Cigarettes    Quit date: 06/21/2006    Years since quitting: 16.3   Smokeless tobacco: Never  Vaping Use   Vaping Use: Never used  Substance and Sexual Activity   Alcohol use: No   Drug use: No   Sexual activity: Not Currently  Other Topics Concern   Not on file  Social History Narrative   Long term resident of [facility]    Social Determinants of Health   Financial Resource Strain: Low Risk  (12/02/2020)   Overall Financial Resource Strain (CARDIA)    Difficulty of Paying Living Expenses: Not hard at all  Food Insecurity: No Food Insecurity (12/02/2020)   Hunger Vital Sign    Worried About Running Out of Food in the Last Year: Never true    Ran Out of Food in the Last Year: Never true  Transportation Needs: No Transportation Needs (12/02/2020)   PRAPARE - Hydrologist (Medical): No    Lack of Transportation (Non-Medical): No  Physical Activity: Inactive (12/02/2020)   Exercise Vital Sign    Days of Exercise per Week: 0 days    Minutes of Exercise per Session: 0 min  Stress: No Stress Concern Present (12/02/2020)    [place]    Feeling of Stress : Not at all  Social Connections: Socially Isolated (12/02/2020)   Social Connection and Isolation Panel [NHANES]    Frequency of Communication with Friends and Family: Three times a week    Frequency of Social Gatherings with Friends and Family: Once a week    Attends Religious Services: Never    Marine scientist or Organizations: No    Attends Archivist Meetings: Never    Marital Status: Widowed    Tobacco Counseling Counseling given: Not Answered   Clinical Intake:  Pre-visit preparation completed: Yes  Pain : No/denies pain     BMI - recorded: 21.82 Nutritional Status: BMI of 19-24  Normal Nutritional Risks: Unintentional weight loss Diabetes: No  How often do you need to have someone help you when you read instructions, pamphlets, or other written materials from your doctor or pharmacy?: 5 - Always  Diabetic?no  Interpreter Needed?: No      Activities of Daily Living    10/27/2022   11:03 AM 06/03/2022    3:30 PM  In your present state of health, do you have any difficulty performing the following activities:  Hearing? 0 0  Vision? 0 0  Difficulty concentrating or making decisions? 1 1  Walking or climbing stairs? 1 1  Dressing or bathing? 1 1  Doing errands, shopping? 1 1  Preparing Food and eating ? Y Y  Using the Toilet? Y Y  In the past six months, have you accidently leaked urine? Y Y  Do you have problems with loss of bowel control? Y Y  Managing your Medications? Y Y  Managing your Finances? Tempie Donning  Housekeeping or managing your Housekeeping? Tempie Donning    Patient Care Team: Gerlene Fee, NP as PCP - General (Geriatric Medicine) Johnsie Cancel,  Wallis Bamberg, MD as Attending Physician (Cardiology) Kathrynn Ducking, MD (Inactive) as Consulting Physician (Neurology) Sinda Du, MD as Consulting Physician (Pulmonary Disease) Carole Civil,  MD as Consulting Physician (Orthopedic Surgery)  Indicate any recent Medical Services you may have received from other than [facility] providers in the past year (date may be approximate).     Assessment:   This is a routine wellness examination for Parneet.  Hearing/Vision screen No results found.  Dietary issues and exercise activities discussed: Current Exercise Habits: The patient does not participate in regular exercise at present, Exercise limited by: None identified   Goals Addressed             This Visit's Progress    Absence of Fall and Fall-Related Injury   On track    Evidence-based guidance:  Assess fall risk using a validated tool when available. Consider balance and gait impairment, muscle weakness, diminished vision or hearing, environmental hazards, presence of urinary or bowel urgency and/or incontinence.  Communicate fall injury risk to interprofessional healthcare team.  Develop a fall prevention plan with the patient and family.  Promote use of personal vision and auditory aids.  Promote reorientation, appropriate sensory stimulation, and routines to decrease risk of fall when changes in mental status are present.  Assess assistance level required for safe and effective self-care; consider referral for home care.  Encourage physical activity, such as performance of self-care at highest level of ability, strength and balance exercise program, and provision of appropriate assistive devices; refer to rehabilitation therapy.  Refer to community-based fall prevention program where available.  If fall occurs, determine the cause and revise fall injury prevention plan.  Regularly review medication contribution to fall risk; consider risk related to polypharmacy and age.  Refer to pharmacist for consultation when concerns about medications are revealed.  Balance adequate pain management with potential for oversedation.  Provide guidance related to environmental  modifications.  Consider supplementation with Vitamin D.   Notes:      Exercise 3x per week (30 min per time)   On track    Ambulate daily in hallway      Follow up with Primary Care Provider   On track    General - Client will not be readmitted within 30 days ([facility])   On track      Depression Screen    10/27/2022   11:02 AM 10/12/2022   11:31 AM 06/03/2022    3:30 PM 02/16/2022   12:45 PM 12/07/2021    1:59 PM 06/02/2021   11:08 AM 01/06/2021   10:57 AM  PHQ 2/9 Scores  PHQ - 2 Score 0 0 0 0 0 0 0  PHQ- 9 Score 0 0  0 0 0     Fall Risk    10/27/2022   11:02 AM 09/03/2022   12:02 PM 06/03/2022    3:29 PM 02/16/2022   12:45 PM 12/07/2021    2:00 PM  [place] in the past year? 1 0 '1 1 1  '$ Number falls in past yr: 1 0 '1 1 1  '$ Injury with Fall? 0 0 0 0 0  Risk for fall due to : History of fall(s);Impaired balance/gait;Impaired mobility No Fall Risks History of fall(s);Impaired balance/gait;Impaired mobility History of fall(s);Impaired balance/gait;Impaired mobility History of fall(s);Impaired balance/gait;Impaired mobility  Follow up Falls evaluation completed Falls evaluation completed       FALL RISK PREVENTION PERTAINING TO THE HOME:  Any stairs in or around the home?  Yes  If so, are there any without handrails? No  Home free of loose throw rugs in walkways, pet beds, electrical cords, etc? Yes  Adequate lighting in your home to reduce risk of falls? Yes   ASSISTIVE DEVICES UTILIZED TO PREVENT FALLS:  Life alert? No  Use of a cane, walker or w/c? Yes  Grab bars in the bathroom? Yes  Shower chair or bench in shower? Yes  Elevated toilet seat or a handicapped toilet? Yes   TIMED UP AND GO:  Was the test performed? Yes .  Length of time to ambulate 10 feet: 15 sec.   Gait unsteady with use of assistive device, provider informed and education provided.   Cognitive Function:    10/27/2022   11:03 AM 06/03/2022    3:30 PM 06/02/2021   11:13 AM 03/10/2017    10:10 AM 04/21/2016   10:21 AM  MMSE - Mini Mental State Exam  Not completed: Unable to complete      Orientation to time  '5 1 3 5  '$ Orientation to Place  0 0 5 4  Registration  '3 3 3 3  '$ Attention/ Calculation  0 0 4 4  Recall  '1 2 1 2  '$ Language- name 2 objects  '2 2 2 2  '$ Language- repeat  '1 1 1 1  '$ Language- follow 3 step command  '1 3 3 3  '$ Language- read & follow direction  0 '1 1 1  '$ Write a sentence  0 '1 1 1  '$ Copy design  0 0 1 1  Total score  '13 14 25 27        '$ 10/27/2022   11:03 AM 06/03/2022    3:31 PM 06/02/2021   11:14 AM 02/27/2019    2:30 PM 02/20/2018    2:04 PM  6CIT Screen  What Year? 4 points 0 points 0 points 0 points 0 points  What month? 3 points 3 points 3 points 0 points 0 points  What time? 3 points 0 points 0 points 0 points 0 points  Count back from 20 4 points 4 points 4 points 4 points 0 points  Months in reverse 4 points 4 points 4 points 2 points 0 points  Repeat phrase 6 points 8 points 10 points 10 points 4 points  Total Score 24 points 19 points 21 points 16 points 4 points    Immunizations Immunization History  Administered Date(s) Administered   Fluad Quad(high Dose 65+) 07/10/2019, 08/22/2020   Influenza Split 08/25/2011, 07/25/2012   Influenza Whole 09/12/2006, 08/25/2007, 08/01/2008, 08/20/2009, 08/20/2010   Influenza, High Dose Seasonal PF 12/13/2018   Influenza,inj,Quad PF,6+ Mos 08/21/2013, 09/04/2014, 08/13/2015, 07/08/2016, 09/23/2017, 08/19/2021   Influenza-Unspecified 08/17/2022   Moderna Covid-19 Vaccine Bivalent Booster 33yr & up 09/08/2022   Moderna SARS-COV2 Booster Vaccination 04/06/2022   Moderna Sars-Covid-2 Vaccination 01/10/2020, 02/08/2020, 09/08/2021   PFIZER Comirnaty(Gray Top)Covid-19 Tri-Sucrose Vaccine 09/23/2020, 03/05/2021   PNEUMOCOCCAL CONJUGATE-20 04/29/2022   Pneumococcal Conjugate-13 06/20/2014   Pneumococcal Polysaccharide-23 09/12/2006, 08/20/2010, 04/16/2011   RSV,unspecified 10/18/2022   Td 08/20/2010   Tdap  09/16/2021   Zoster Recombinat (Shingrix) 07/14/2021, 09/08/2021, 10/16/2021   Zoster, Live 12/26/2013    TDAP status: Up to date  Flu Vaccine status: Up to date  Pneumococcal vaccine status: Up to date  Covid-19 vaccine status: Completed vaccines  Qualifies for Shingles Vaccine? Yes   Zostavax completed Yes   Shingrix Completed?: Yes  Screening Tests Health Maintenance  Topic Date Due   COVID-19 Vaccine (8 -  2023-24 season) 11/03/2022   Medicare Annual Wellness (AWV)  06/04/2023   DTaP/Tdap/Td (3 - Td or Tdap) 09/17/2031   Pneumonia Vaccine 60+ Years old  Completed   INFLUENZA VACCINE  Completed   DEXA SCAN  Completed   Zoster Vaccines- Shingrix  Completed   HPV VACCINES  Aged Out   COLONOSCOPY (Pts 45-30yr Insurance coverage will need to be confirmed)  Discontinued    Health Maintenance  There are no preventive care reminders to display for this patient.  Colorectal cancer screening: No longer required.   Mammogram status: No longer required due to age.  Bone Density status: Completed 06-30-22. Results reflect: Bone density results: OSTEOPENIA. Repeat every 2 years.  Lung Cancer Screening: (Low Dose CT Chest recommended if Age 542-80years, 30 pack-year currently smoking OR have quit w/in 15years.) does not qualify.   Lung Cancer Screening Referral: n/a   Additional Screening:  Hepatitis C Screening: does qualify; Completed 06-04-21  Vision Screening: Recommended annual ophthalmology exams for early detection of glaucoma and other disorders of the eye. Is the patient up to date with their annual eye exam?  No  Who is the provider or what is the name of the office in which the patient attends annual eye exams?  If pt is not established with a provider, would they like to be referred to a provider to establish care? No .   Dental Screening: Recommended annual dental exams for proper oral hygiene  Community Resource Referral / Chronic Care Management: CRR  required this visit?  No   CCM required this visit?  No      Plan:     I have personally reviewed and noted the following in the patient's chart:   Medical and social history Use of alcohol, tobacco or illicit drugs  Current medications and supplements including opioid prescriptions. Patient is not currently taking opioid prescriptions. Functional ability and status Nutritional status Physical activity Advanced directives List of other physicians Hospitalizations, surgeries, and ER visits in previous 12 months Vitals Screenings to include cognitive, depression, and falls Referrals and appointments  In addition, I have reviewed and discussed with patient certain preventive protocols, quality metrics, and best practice recommendations. A written personalized care plan for preventive services as well as general preventive health recommendations were provided to patient.     DGerlene Fee NP   10/27/2022   Nurse Notes: this exam has been performed at this facility by myself.

## 2022-10-28 ENCOUNTER — Other Ambulatory Visit (HOSPITAL_COMMUNITY)
Admission: RE | Admit: 2022-10-28 | Discharge: 2022-10-28 | Disposition: A | Discharge status: Home / Self Care | Payer: Medicare PPO | Source: Skilled Nursing Facility | Attending: Adult Health | Admitting: Adult Health

## 2022-10-28 DIAGNOSIS — M6281 Muscle weakness (generalized): Secondary | ICD-10-CM | POA: Diagnosis not present

## 2022-10-28 DIAGNOSIS — J449 Chronic obstructive pulmonary disease, unspecified: Secondary | ICD-10-CM | POA: Diagnosis not present

## 2022-10-28 DIAGNOSIS — E785 Hyperlipidemia, unspecified: Secondary | ICD-10-CM | POA: Diagnosis not present

## 2022-10-28 DIAGNOSIS — M159 Polyosteoarthritis, unspecified: Secondary | ICD-10-CM | POA: Diagnosis not present

## 2022-10-28 DIAGNOSIS — Z9181 History of falling: Secondary | ICD-10-CM | POA: Diagnosis not present

## 2022-10-28 DIAGNOSIS — R262 Difficulty in walking, not elsewhere classified: Secondary | ICD-10-CM | POA: Diagnosis not present

## 2022-10-28 LAB — LIPID PANEL
Cholesterol: 110 mg/dL (ref 0–200)
HDL: 35 mg/dL — ABNORMAL LOW (ref >40)
LDL Cholesterol: 63 mg/dL (ref 0–99)
Total CHOL/HDL Ratio: 3.1 RATIO
Triglycerides: 59 mg/dL (ref <150)
VLDL: 12 mg/dL (ref 0–40)

## 2022-10-29 DIAGNOSIS — M159 Polyosteoarthritis, unspecified: Secondary | ICD-10-CM | POA: Diagnosis not present

## 2022-10-29 DIAGNOSIS — R262 Difficulty in walking, not elsewhere classified: Secondary | ICD-10-CM | POA: Diagnosis not present

## 2022-10-29 DIAGNOSIS — J449 Chronic obstructive pulmonary disease, unspecified: Secondary | ICD-10-CM | POA: Diagnosis not present

## 2022-10-29 DIAGNOSIS — Z9181 History of falling: Secondary | ICD-10-CM | POA: Diagnosis not present

## 2022-10-29 DIAGNOSIS — M6281 Muscle weakness (generalized): Secondary | ICD-10-CM | POA: Diagnosis not present

## 2022-11-01 DIAGNOSIS — J449 Chronic obstructive pulmonary disease, unspecified: Secondary | ICD-10-CM | POA: Diagnosis not present

## 2022-11-01 DIAGNOSIS — M6281 Muscle weakness (generalized): Secondary | ICD-10-CM | POA: Diagnosis not present

## 2022-11-01 DIAGNOSIS — Z9181 History of falling: Secondary | ICD-10-CM | POA: Diagnosis not present

## 2022-11-01 DIAGNOSIS — R262 Difficulty in walking, not elsewhere classified: Secondary | ICD-10-CM | POA: Diagnosis not present

## 2022-11-01 DIAGNOSIS — M159 Polyosteoarthritis, unspecified: Secondary | ICD-10-CM | POA: Diagnosis not present

## 2022-11-02 DIAGNOSIS — M159 Polyosteoarthritis, unspecified: Secondary | ICD-10-CM | POA: Diagnosis not present

## 2022-11-02 DIAGNOSIS — M6281 Muscle weakness (generalized): Secondary | ICD-10-CM | POA: Diagnosis not present

## 2022-11-02 DIAGNOSIS — Z9181 History of falling: Secondary | ICD-10-CM | POA: Diagnosis not present

## 2022-11-02 DIAGNOSIS — J449 Chronic obstructive pulmonary disease, unspecified: Secondary | ICD-10-CM | POA: Diagnosis not present

## 2022-11-02 DIAGNOSIS — R262 Difficulty in walking, not elsewhere classified: Secondary | ICD-10-CM | POA: Diagnosis not present

## 2022-11-03 DIAGNOSIS — Z9181 History of falling: Secondary | ICD-10-CM | POA: Diagnosis not present

## 2022-11-03 DIAGNOSIS — M6281 Muscle weakness (generalized): Secondary | ICD-10-CM | POA: Diagnosis not present

## 2022-11-03 DIAGNOSIS — J449 Chronic obstructive pulmonary disease, unspecified: Secondary | ICD-10-CM | POA: Diagnosis not present

## 2022-11-03 DIAGNOSIS — R262 Difficulty in walking, not elsewhere classified: Secondary | ICD-10-CM | POA: Diagnosis not present

## 2022-11-03 DIAGNOSIS — M159 Polyosteoarthritis, unspecified: Secondary | ICD-10-CM | POA: Diagnosis not present

## 2022-11-04 DIAGNOSIS — M6281 Muscle weakness (generalized): Secondary | ICD-10-CM | POA: Diagnosis not present

## 2022-11-04 DIAGNOSIS — M159 Polyosteoarthritis, unspecified: Secondary | ICD-10-CM | POA: Diagnosis not present

## 2022-11-04 DIAGNOSIS — R262 Difficulty in walking, not elsewhere classified: Secondary | ICD-10-CM | POA: Diagnosis not present

## 2022-11-04 DIAGNOSIS — Z9181 History of falling: Secondary | ICD-10-CM | POA: Diagnosis not present

## 2022-11-04 DIAGNOSIS — J449 Chronic obstructive pulmonary disease, unspecified: Secondary | ICD-10-CM | POA: Diagnosis not present

## 2022-11-05 DIAGNOSIS — R262 Difficulty in walking, not elsewhere classified: Secondary | ICD-10-CM | POA: Diagnosis not present

## 2022-11-05 DIAGNOSIS — Z9181 History of falling: Secondary | ICD-10-CM | POA: Diagnosis not present

## 2022-11-05 DIAGNOSIS — M159 Polyosteoarthritis, unspecified: Secondary | ICD-10-CM | POA: Diagnosis not present

## 2022-11-05 DIAGNOSIS — M6281 Muscle weakness (generalized): Secondary | ICD-10-CM | POA: Diagnosis not present

## 2022-11-05 DIAGNOSIS — J449 Chronic obstructive pulmonary disease, unspecified: Secondary | ICD-10-CM | POA: Diagnosis not present

## 2022-11-10 DIAGNOSIS — R262 Difficulty in walking, not elsewhere classified: Secondary | ICD-10-CM | POA: Diagnosis not present

## 2022-11-10 DIAGNOSIS — M6281 Muscle weakness (generalized): Secondary | ICD-10-CM | POA: Diagnosis not present

## 2022-11-10 DIAGNOSIS — M159 Polyosteoarthritis, unspecified: Secondary | ICD-10-CM | POA: Diagnosis not present

## 2022-11-10 DIAGNOSIS — J449 Chronic obstructive pulmonary disease, unspecified: Secondary | ICD-10-CM | POA: Diagnosis not present

## 2022-11-10 DIAGNOSIS — Z9181 History of falling: Secondary | ICD-10-CM | POA: Diagnosis not present

## 2022-11-11 DIAGNOSIS — J449 Chronic obstructive pulmonary disease, unspecified: Secondary | ICD-10-CM | POA: Diagnosis not present

## 2022-11-11 DIAGNOSIS — R262 Difficulty in walking, not elsewhere classified: Secondary | ICD-10-CM | POA: Diagnosis not present

## 2022-11-11 DIAGNOSIS — M159 Polyosteoarthritis, unspecified: Secondary | ICD-10-CM | POA: Diagnosis not present

## 2022-11-11 DIAGNOSIS — Z9181 History of falling: Secondary | ICD-10-CM | POA: Diagnosis not present

## 2022-11-11 DIAGNOSIS — M6281 Muscle weakness (generalized): Secondary | ICD-10-CM | POA: Diagnosis not present

## 2022-11-12 ENCOUNTER — Encounter: Payer: Self-pay | Admitting: Adult Health

## 2022-11-12 ENCOUNTER — Non-Acute Institutional Stay (SKILLED_NURSING_FACILITY): Payer: Medicare PPO | Admitting: Adult Health

## 2022-11-12 DIAGNOSIS — Z9181 History of falling: Secondary | ICD-10-CM | POA: Diagnosis not present

## 2022-11-12 DIAGNOSIS — F0390 Unspecified dementia without behavioral disturbance: Secondary | ICD-10-CM

## 2022-11-12 DIAGNOSIS — M159 Polyosteoarthritis, unspecified: Secondary | ICD-10-CM | POA: Diagnosis not present

## 2022-11-12 DIAGNOSIS — M6281 Muscle weakness (generalized): Secondary | ICD-10-CM | POA: Diagnosis not present

## 2022-11-12 DIAGNOSIS — J9612 Chronic respiratory failure with hypercapnia: Secondary | ICD-10-CM

## 2022-11-12 DIAGNOSIS — R262 Difficulty in walking, not elsewhere classified: Secondary | ICD-10-CM | POA: Diagnosis not present

## 2022-11-12 DIAGNOSIS — N1831 Chronic kidney disease, stage 3a: Secondary | ICD-10-CM | POA: Diagnosis not present

## 2022-11-12 DIAGNOSIS — J9611 Chronic respiratory failure with hypoxia: Secondary | ICD-10-CM | POA: Diagnosis not present

## 2022-11-12 DIAGNOSIS — J449 Chronic obstructive pulmonary disease, unspecified: Secondary | ICD-10-CM | POA: Diagnosis not present

## 2022-11-12 NOTE — Progress Notes (Signed)
Location:  [place] Room Number: 149-W Place of Service:  SNF (31)   CODE STATUS: DNR  Allergies  Allergen Reactions   Fenofibrate Nausea And Vomiting   Pravastatin Sodium Nausea And Vomiting   Sulfonamide Derivatives Other (See Comments)    unknown   Ciprofloxacin Rash    Chief Complaint  Patient presents with   Care Plan Meeting    HPI:  We have come together for her care plan meeting. BIMS 6/15 mood 0/30. She is nonambulatory uses wheelchair has had 4 falls without injury. She requires moderate assist with her adls. She is incontinent of bladder and bowel. Dietary: regular diet weight is 165.4 pounds; appetite is 25-75%. Therapy: PT bed mobility, sit to stand: min assist. Activities: passive participation. She will continue to be followed for her chronic illnesses including:  Chronic respiratory failure with hypoxia and hypercapnia   Major neurocognitive disorder  Stage 3a chronic kidney disease  Past Medical History:  Diagnosis Date   Abnormality of gait 06/06/2014   Allergic rhinitis    Anemia    Ankle fracture, right    Anxiety    Arm fracture, left    Arthritis    abnormal gait    COPD (chronic obstructive pulmonary disease) (HCC)    chronic CO2 retention, decreased DLCO    Cystic acne    adult    Degenerative disc disease, cervical    syrinx C3-7   Degenerative disc disease, lumbar    L5 nerve impingement    Dementia (HCC)    Depression    DNR (do not resuscitate)    Fracture of right proximal fibula 07/13/18 09/05/2018   GERD (gastroesophageal reflux disease)    Heme positive stool 10/10/2017   History of colon surgery    right hemicolectomy for high-grade adenomas in right colon 2013   Hyperglycemia    Hypertension    Low back pain    Mediastinal lymphadenopathy 1/9   resolving    Memory difficulty 09/20/2014   mild dementia   Onychomycosis    OSA on CPAP    Sleep apnea    stop bang score 6    Past Surgical History:   Procedure Laterality Date   ABDOMINAL HYSTERECTOMY  1976   secondary to bleeding    APPENDECTOMY     COLON RESECTION  08/18/2012   Procedure: HAND ASSISTED LAPAROSCOPIC COLON RESECTION;  Surgeon: Jamesetta So, MD;  Location: [facility];  Service: General;  Laterality: N/A;   COLONOSCOPY  07/20/2012   Dr. Gala Romney: multiple colonic polyps with large polyps on right side, s/p saline-assisted debulking piecemeal polypectomy and ablation. Not all removed. Path with tubulovillous adenomas, high grade dysplasia.    COLONOSCOPY N/A 03/09/2013   [id number] polyps-tubular adenomas. S/p right hemicolectomy. next tcs 02/2018   epidural steroids     ESOPHAGOGASTRODUODENOSCOPY (EGD) WITH ESOPHAGEAL DILATION N/A 02/06/2014   Procedure: ESOPHAGOGASTRODUODENOSCOPY (EGD) WITH ESOPHAGEAL DILATION;  Surgeon: Daneil Dolin, MD;  Location[facility];  Service: Endoscopy;  Laterality: N/A;  9:30   OOPHORECTOMY  1976   ORIF ANKLE FRACTURE  01/18/2012   Procedure: OPEN REDUCTION INTERNAL FIXATION (ORIF) ANKLE FRACTURE;  Surgeon: Arther Abbott, MD;  Location: [facility];  Service: Orthopedics;  Laterality: Left;   ORIF ANKLE FRACTURE Right 01/13/2016   Procedure: OPEN REDUCTION INTERNAL FIXATION (ORIF) ANKLE FRACTURE;  Surgeon: Carole Civil, MD;  Location: [facility];  Service: Orthopedics;  Laterality: Right;    Social History   Socioeconomic History  Marital status: Widowed    Spouse name: Not on file   Number of children: 4   Years of education: college 1   Highest education level: Not on file  Occupational History   Occupation: employed in the tobacco industry     Employer: RETIRED   Occupation: retired  Tobacco Use   Smoking status: Former    Packs/day: 1.50    Years: 40.00    Total pack years: 60.00    Types: Cigarettes    Quit date: 06/21/2006    Years since quitting: 16.4   Smokeless tobacco: Never  Vaping Use   Vaping Use: Never used  Substance and Sexual Activity   Alcohol use: No   Drug use:  No   Sexual activity: Not Currently  Other Topics Concern   Not on file  Social History Narrative   Long term resident of [facility]    Social Determinants of Health   Financial Resource Strain: [place]  (12/02/2020)   Overall Financial Resource Strain (CARDIA)    Difficulty of Paying Living Expenses: Not hard at all  Food Insecurity: No Food Insecurity (12/02/2020)   Hunger Vital Sign    Worried About Running Out of Food in the Last Year: Never true    [place] in the Last Year: Never true  Transportation Needs: No Transportation Needs (12/02/2020)   PRAPARE - Hydrologist (Medical): No    Lack of Transportation (Non-Medical): No  Physical Activity: Inactive (12/02/2020)   Exercise Vital Sign    Days of Exercise per Week: 0 days    Minutes of Exercise per Session: 0 min  Stress: No Stress Concern Present (12/02/2020)   [place]    Feeling of Stress : Not at all  Social Connections: Socially Isolated (12/02/2020)   Social Connection and Isolation Panel [NHANES]    Frequency of Communication with Friends and Family: Three times a week    Frequency of Social Gatherings with Friends and Family: Once a week    Attends Religious Services: Never    Marine scientist or Organizations: No    Attends Archivist Meetings: Never    Marital Status: Widowed  Intimate Partner Violence: Not At Risk (12/02/2020)   Humiliation, Afraid, Rape, and Kick questionnaire    Fear of Current or Ex-Partner: No    Emotionally Abused: No    Physically Abused: No    Sexually Abused: No   Family History  Problem Relation Age of Onset   Stomach cancer Father    Cancer Father    Cancer Mother        brain    Breast cancer Mother    Arthritis Other       VITAL SIGNS BP 108/63   Pulse 70   Temp 98.3 F (36.8 C)   Resp 18   Ht '6\' 1"'$  (1.854 m)   Wt 165 lb 6.4 oz (75 kg)   SpO2 97%   BMI  21.82 kg/m   Outpatient Encounter Medications as of 11/12/2022  Medication Sig   acetaminophen (TYLENOL) 325 MG tablet Take 650 mg by mouth every 6 (six) hours as needed.   alendronate (FOSAMAX) 70 MG tablet Take 70 mg by mouth every Sunday. Take on an empty stomach at least 30 mns prior to food. Sit upright x 26ms after taking meds.   atorvastatin (LIPITOR) 10 MG tablet TAKE 1 TABLET EVERY DAY  Calcium Carb-Cholecalciferol (CALCIUM 600+D) 600-10 MG-MCG TABS Take 1 tablet by mouth daily.   cholecalciferol (VITAMIN D3) 25 MCG (1000 UNIT) tablet Take 1,000 Units by mouth daily.   donepezil (ARICEPT) 10 MG tablet TAKE 1 TABLET AT BEDTIME   loratadine (CLARITIN) 10 MG tablet Take 10 mg by mouth daily.   Magnesium Hydroxide (MILK OF MAGNESIA PO) Take 30 mLs by mouth as needed.   mirabegron ER (MYRBETRIQ) 50 MG TB24 tablet Take 50 mg by mouth daily.   NON FORMULARY Diet: Regular diet with chopped meats and gravy to all meats.   Nutritional Supplements (ENSURE CLEAR) LIQD Take 1 Can by mouth 2 (two) times daily between meals. Due to weight loss   omeprazole (PRILOSEC) 40 MG capsule TAKE 1 CAPSULE EVERY DAY   OXYGEN Inhale 2 L into the lungs continuous.   potassium chloride SA (KLOR-CON M) 20 MEQ tablet Take 20 mEq by mouth daily.   STRIVERDI RESPIMAT 2.5 MCG/ACT AERS INHALE 2 PUFFS ONE TIME DAILY AT THE SAME TIME   verapamil (CALAN-SR) 180 MG CR tablet Take 180 mg by mouth daily.   [DISCONTINUED] azithromycin (ZITHROMAX) 500 MG tablet Take 500 mg by mouth daily.   [DISCONTINUED] doxycycline (VIBRAMYCIN) 100 MG capsule Take 100 mg by mouth 2 (two) times daily.   [DISCONTINUED] ipratropium-albuterol (DUONEB) 0.5-2.5 (3) MG/3ML SOLN Take 3 mLs by nebulization every 6 (six) hours as needed.   [DISCONTINUED] saccharomyces boulardii (FLORASTOR) 250 MG capsule Take 250 mg by mouth 2 (two) times daily.   No facility-administered encounter medications on file as of 11/12/2022.     SIGNIFICANT  DIAGNOSTIC EXAMS  PREVIOUS   10-20-22: chest x-ray:  Early changes of infrahilar atelectasis or pneumonia. Findings favor atelectasis in the absence of leukocytosis   NO NEW EXAMS   LABS REVIEWED PREVIOUS  12-03-21: wbc 3.6; hgb 10.3; hct 33.9; mcv 88.3 plt 220; glucose 87; bun 19; creat 0.92; k+ 4.1; na++ 140; ca 8.9; >60; total protein 6.1 albumin 3.6 01-04-22: wbc 5.8; hgb 10.0; hct 32.9; mcv 89.2 plt 188; glucose 78; bun 0.88; creat 0.88; k+ 3.8; na++ 136; ca 9.2; GFR >60 d-dimer 0.54; CRP 1.6  01-04-22 (ED) wbc10.6; hgb 12.0; hct 40.6; mcv89.8 plt 229; glucose 160; bun 26; creat 1.24; k+ 4.0; na++ 136; ca 9.0; GFR 44; protein 7.4 albumin 4.0 01-08-31: wbc 14.6; hgb 9.5; hct 31.9; mcv 86.4 plt 219; glucose 118; bun 17; creat 0.62; k+ 3.5; na++ 139; ca 8.2; GFR>60 protein 6.6; albumin 3.1; mag 2.0; CRP 12.2; d-dimer 0.77  05-21-22: wbc 4.8; hgb 11.; hct 37.5; mcv 87.2 plt 229; glucose 74; bun 26; creat 1.05 ;k+ 3.8; na++ 140; ca 9.2; gfr 54  06-10-22: vitamin D 39.61 10-20-22: wbc 13.6; hgb 9.7; hct 32.0; mcv 86.3 plt 205; glucose 90; bun 25; creat 1.20; k+ 3.7; na++ 139; ca 8.5 gfr 45    NO NEW LABS.    Review of Systems  Constitutional:  Negative for malaise/fatigue.  Respiratory:  Negative for cough and shortness of breath.   Cardiovascular:  Negative for chest pain, palpitations and leg swelling.  Gastrointestinal:  Negative for abdominal pain, constipation and heartburn.  Musculoskeletal:  Negative for back pain, joint pain and myalgias.  Skin: Negative.   Neurological:  Negative for dizziness.  Psychiatric/Behavioral:  The patient is not nervous/anxious.    Physical Exam Constitutional:      General: She is not in acute distress.    Appearance: She is well-developed. She is not diaphoretic.  Neck:  Thyroid: No thyromegaly.  Cardiovascular:     Rate and Rhythm: Normal rate and regular rhythm.     Pulses: Normal pulses.     Heart sounds: Normal heart sounds.  Pulmonary:      Effort: Pulmonary effort is normal. No respiratory distress.     Breath sounds: Normal breath sounds.  Abdominal:     General: Bowel sounds are normal. There is no distension.     Palpations: Abdomen is soft.     Tenderness: There is no abdominal tenderness.  Musculoskeletal:        General: Normal range of motion.     Cervical back: Neck supple.     Right lower leg: No edema.     Left lower leg: No edema.  Lymphadenopathy:     Cervical: No cervical adenopathy.  Skin:    General: Skin is warm and dry.  Neurological:     Mental Status: She is alert. Mental status is at baseline.  Psychiatric:        Mood and Affect: Mood normal.      ASSESSMENT/ PLAN:  TODAY  Chronic respiratory failure with hypoxia and hypercapnia Major neurocognitive disorder Stage 3a chronic kidney disease  Will continue current medications Will continue current plan of care Will continue to monitor her status  Time spent with patient: 40 minutes: medications; plan of care; dietary    Ok Edwards NP [facility]   call [phone]

## 2022-11-13 DIAGNOSIS — Z9181 History of falling: Secondary | ICD-10-CM | POA: Diagnosis not present

## 2022-11-13 DIAGNOSIS — M159 Polyosteoarthritis, unspecified: Secondary | ICD-10-CM | POA: Diagnosis not present

## 2022-11-13 DIAGNOSIS — J449 Chronic obstructive pulmonary disease, unspecified: Secondary | ICD-10-CM | POA: Diagnosis not present

## 2022-11-13 DIAGNOSIS — R262 Difficulty in walking, not elsewhere classified: Secondary | ICD-10-CM | POA: Diagnosis not present

## 2022-11-13 DIAGNOSIS — M6281 Muscle weakness (generalized): Secondary | ICD-10-CM | POA: Diagnosis not present

## 2022-11-14 DIAGNOSIS — J449 Chronic obstructive pulmonary disease, unspecified: Secondary | ICD-10-CM | POA: Diagnosis not present

## 2022-11-14 DIAGNOSIS — M159 Polyosteoarthritis, unspecified: Secondary | ICD-10-CM | POA: Diagnosis not present

## 2022-11-14 DIAGNOSIS — M6281 Muscle weakness (generalized): Secondary | ICD-10-CM | POA: Diagnosis not present

## 2022-11-14 DIAGNOSIS — R262 Difficulty in walking, not elsewhere classified: Secondary | ICD-10-CM | POA: Diagnosis not present

## 2022-11-14 DIAGNOSIS — Z9181 History of falling: Secondary | ICD-10-CM | POA: Diagnosis not present

## 2022-11-15 DIAGNOSIS — Z9181 History of falling: Secondary | ICD-10-CM | POA: Diagnosis not present

## 2022-11-15 DIAGNOSIS — M6281 Muscle weakness (generalized): Secondary | ICD-10-CM | POA: Diagnosis not present

## 2022-11-15 DIAGNOSIS — M159 Polyosteoarthritis, unspecified: Secondary | ICD-10-CM | POA: Diagnosis not present

## 2022-11-15 DIAGNOSIS — J449 Chronic obstructive pulmonary disease, unspecified: Secondary | ICD-10-CM | POA: Diagnosis not present

## 2022-11-15 DIAGNOSIS — R262 Difficulty in walking, not elsewhere classified: Secondary | ICD-10-CM | POA: Diagnosis not present

## 2022-11-18 DIAGNOSIS — M159 Polyosteoarthritis, unspecified: Secondary | ICD-10-CM | POA: Diagnosis not present

## 2022-11-18 DIAGNOSIS — Z9181 History of falling: Secondary | ICD-10-CM | POA: Diagnosis not present

## 2022-11-18 DIAGNOSIS — M6281 Muscle weakness (generalized): Secondary | ICD-10-CM | POA: Diagnosis not present

## 2022-11-18 DIAGNOSIS — R262 Difficulty in walking, not elsewhere classified: Secondary | ICD-10-CM | POA: Diagnosis not present

## 2022-11-18 DIAGNOSIS — J449 Chronic obstructive pulmonary disease, unspecified: Secondary | ICD-10-CM | POA: Diagnosis not present

## 2022-11-19 DIAGNOSIS — Z9181 History of falling: Secondary | ICD-10-CM | POA: Diagnosis not present

## 2022-11-19 DIAGNOSIS — M6281 Muscle weakness (generalized): Secondary | ICD-10-CM | POA: Diagnosis not present

## 2022-11-19 DIAGNOSIS — J449 Chronic obstructive pulmonary disease, unspecified: Secondary | ICD-10-CM | POA: Diagnosis not present

## 2022-11-19 DIAGNOSIS — R262 Difficulty in walking, not elsewhere classified: Secondary | ICD-10-CM | POA: Diagnosis not present

## 2022-11-19 DIAGNOSIS — M159 Polyosteoarthritis, unspecified: Secondary | ICD-10-CM | POA: Diagnosis not present

## 2022-11-20 DIAGNOSIS — Z9181 History of falling: Secondary | ICD-10-CM | POA: Diagnosis not present

## 2022-11-20 DIAGNOSIS — M6281 Muscle weakness (generalized): Secondary | ICD-10-CM | POA: Diagnosis not present

## 2022-11-20 DIAGNOSIS — M159 Polyosteoarthritis, unspecified: Secondary | ICD-10-CM | POA: Diagnosis not present

## 2022-11-20 DIAGNOSIS — J449 Chronic obstructive pulmonary disease, unspecified: Secondary | ICD-10-CM | POA: Diagnosis not present

## 2022-11-20 DIAGNOSIS — R262 Difficulty in walking, not elsewhere classified: Secondary | ICD-10-CM | POA: Diagnosis not present

## 2022-11-22 DIAGNOSIS — M6281 Muscle weakness (generalized): Secondary | ICD-10-CM | POA: Diagnosis not present

## 2022-11-22 DIAGNOSIS — B351 Tinea unguium: Secondary | ICD-10-CM | POA: Diagnosis not present

## 2022-11-22 DIAGNOSIS — M2041 Other hammer toe(s) (acquired), right foot: Secondary | ICD-10-CM | POA: Diagnosis not present

## 2022-11-22 DIAGNOSIS — M2042 Other hammer toe(s) (acquired), left foot: Secondary | ICD-10-CM | POA: Diagnosis not present

## 2022-11-22 DIAGNOSIS — M159 Polyosteoarthritis, unspecified: Secondary | ICD-10-CM | POA: Diagnosis not present

## 2022-11-22 DIAGNOSIS — Z9181 History of falling: Secondary | ICD-10-CM | POA: Diagnosis not present

## 2022-11-22 DIAGNOSIS — I739 Peripheral vascular disease, unspecified: Secondary | ICD-10-CM | POA: Diagnosis not present

## 2022-11-22 DIAGNOSIS — J449 Chronic obstructive pulmonary disease, unspecified: Secondary | ICD-10-CM | POA: Diagnosis not present

## 2022-11-22 DIAGNOSIS — R262 Difficulty in walking, not elsewhere classified: Secondary | ICD-10-CM | POA: Diagnosis not present

## 2022-11-24 ENCOUNTER — Non-Acute Institutional Stay (SKILLED_NURSING_FACILITY): Payer: Medicare PPO | Admitting: Adult Health

## 2022-11-24 ENCOUNTER — Encounter: Payer: Self-pay | Admitting: Adult Health

## 2022-11-24 DIAGNOSIS — M6281 Muscle weakness (generalized): Secondary | ICD-10-CM | POA: Diagnosis not present

## 2022-11-24 DIAGNOSIS — R7989 Other specified abnormal findings of blood chemistry: Secondary | ICD-10-CM | POA: Diagnosis not present

## 2022-11-24 DIAGNOSIS — E559 Vitamin D deficiency, unspecified: Secondary | ICD-10-CM

## 2022-11-24 DIAGNOSIS — Z9181 History of falling: Secondary | ICD-10-CM | POA: Diagnosis not present

## 2022-11-24 DIAGNOSIS — M159 Polyosteoarthritis, unspecified: Secondary | ICD-10-CM | POA: Diagnosis not present

## 2022-11-24 DIAGNOSIS — R1312 Dysphagia, oropharyngeal phase: Secondary | ICD-10-CM

## 2022-11-24 DIAGNOSIS — R262 Difficulty in walking, not elsewhere classified: Secondary | ICD-10-CM | POA: Diagnosis not present

## 2022-11-24 DIAGNOSIS — E785 Hyperlipidemia, unspecified: Secondary | ICD-10-CM | POA: Diagnosis not present

## 2022-11-24 DIAGNOSIS — J449 Chronic obstructive pulmonary disease, unspecified: Secondary | ICD-10-CM | POA: Diagnosis not present

## 2022-11-24 NOTE — Progress Notes (Signed)
Location:  [place] Room Number: [place] of Service:  SNF (31)   CODE STATUS: DNR  Allergies  Allergen Reactions   Fenofibrate Nausea And Vomiting   Pravastatin Sodium Nausea And Vomiting   Sulfonamide Derivatives Other (See Comments)    unknown   Ciprofloxacin Rash    Chief Complaint  Patient presents with   Medical Management of Chronic Issues                       Dysphagia oropharyngeal phase:Vitamin D deficiency: Hyperlipidemia LDL goal <100;  Low thyroid stimulation hormone    HPI:  She is an 82 year old long term resident of this facility being seen for the management of her chronic illnesses;Dysphagia oropharyngeal phase:Vitamin D deficiency: Hyperlipidemia LDL goal <100;  Low thyroid stimulation hormone. She continues to wheel herself around the facility. She is at times exit seeking. There are no reports of uncontrolled pain. Her weight remains stable.     Past Medical History:  Diagnosis Date   Abnormality of gait 06/06/2014   Allergic rhinitis    Anemia    Ankle fracture, right    Anxiety    Arm fracture, left    Arthritis    abnormal gait    COPD (chronic obstructive pulmonary disease) (HCC)    chronic CO2 retention, decreased DLCO    Cystic acne    adult    Degenerative disc disease, cervical    syrinx C3-7   Degenerative disc disease, lumbar    L5 nerve impingement    Dementia (HCC)    Depression    DNR (do not resuscitate)    Fracture of right proximal fibula 07/13/18 09/05/2018   GERD (gastroesophageal reflux disease)    Heme positive stool 10/10/2017   History of colon surgery    right hemicolectomy for high-grade adenomas in right colon 2013   Hyperglycemia    Hypertension    Low back pain    Mediastinal lymphadenopathy 1/9   resolving    Memory difficulty 09/20/2014   mild dementia   Onychomycosis    OSA on CPAP    Sleep apnea    stop bang score 6    Past Surgical History:  Procedure Laterality Date    ABDOMINAL HYSTERECTOMY  1976   secondary to bleeding    APPENDECTOMY     COLON RESECTION  08/18/2012   Procedure: HAND ASSISTED LAPAROSCOPIC COLON RESECTION;  Surgeon: Jamesetta So, MD;  Location: [facility];  Service: General;  Laterality: N/A;   COLONOSCOPY  07/20/2012   Dr. Gala Romney: multiple colonic polyps with large polyps on right side, s/p saline-assisted debulking piecemeal polypectomy and ablation. Not all removed. Path with tubulovillous adenomas, high grade dysplasia.    COLONOSCOPY N/A 03/09/2013   [id number] polyps-tubular adenomas. S/p right hemicolectomy. next tcs 02/2018   epidural steroids     ESOPHAGOGASTRODUODENOSCOPY (EGD) WITH ESOPHAGEAL DILATION N/A 02/06/2014   Procedure: ESOPHAGOGASTRODUODENOSCOPY (EGD) WITH ESOPHAGEAL DILATION;  Surgeon: Daneil Dolin, MD;  [facility];  Service: Endoscopy;  Laterality: N/A;  9:30   OOPHORECTOMY  1976   ORIF ANKLE FRACTURE  01/18/2012   Procedure: OPEN REDUCTION INTERNAL FIXATION (ORIF) ANKLE FRACTURE;  Surgeon: Arther Abbott, MD;  Location: [facility];  Service: Orthopedics;  Laterality: Left;   ORIF ANKLE FRACTURE Right 01/13/2016   Procedure: OPEN REDUCTION INTERNAL FIXATION (ORIF) ANKLE FRACTURE;  Surgeon: Carole Civil, MD;  Location: [facility];  Service: Orthopedics;  Laterality: Right;    Social History   Socioeconomic History   Marital status: Widowed    Spouse name: Not on file   Number of children: 4   Years of education: college 1   Highest education level: Not on file  Occupational History   Occupation: employed in the tobacco industry     Employer: RETIRED   Occupation: retired  Tobacco Use   Smoking status: Former    Packs/day: 1.50    Years: 40.00    Total pack years: 60.00    Types: Cigarettes    Quit date: 06/21/2006    Years since quitting: 16.4   Smokeless tobacco: Never  Vaping Use   Vaping Use: Never used  Substance and Sexual Activity   Alcohol use: No   Drug use: No   Sexual activity: Not  Currently  Other Topics Concern   Not on file  Social History Narrative   Long term resident of [facility]    Social Determinants of Health   Financial Resource Strain: [place]  (12/02/2020)   Overall Financial Resource Strain (CARDIA)    Difficulty of Paying Living Expenses: Not hard at all  Food Insecurity: No Food Insecurity (12/02/2020)   Hunger Vital Sign    Worried About Running Out of Food in the Last Year: Never true    [place] in the Last Year: Never true  Transportation Needs: No Transportation Needs (12/02/2020)   PRAPARE - Hydrologist (Medical): No    Lack of Transportation (Non-Medical): No  Physical Activity: Inactive (12/02/2020)   Exercise Vital Sign    Days of Exercise per Week: 0 days    Minutes of Exercise per Session: 0 min  Stress: No Stress Concern Present (12/02/2020)   [place]    Feeling of Stress : Not at all  Social Connections: Socially Isolated (12/02/2020)   Social Connection and Isolation Panel [NHANES]    Frequency of Communication with Friends and Family: Three times a week    Frequency of Social Gatherings with Friends and Family: Once a week    Attends Religious Services: Never    Marine scientist or Organizations: No    Attends Archivist Meetings: Never    Marital Status: Widowed  Intimate Partner Violence: Not At Risk (12/02/2020)   Humiliation, Afraid, Rape, and Kick questionnaire    Fear of Current or Ex-Partner: No    Emotionally Abused: No    Physically Abused: No    Sexually Abused: No   Family History  Problem Relation Age of Onset   Stomach cancer Father    Cancer Father    Cancer Mother        brain    Breast cancer Mother    Arthritis Other       VITAL SIGNS BP 111/67   Pulse 78   Temp 97.9 F (36.6 C)   Resp 20   Ht '6\' 1"'$  (1.854 m)   Wt 165 lb (74.8 kg)   SpO2 96%   BMI 21.77 kg/m   Outpatient  Encounter Medications as of 11/24/2022  Medication Sig   acetaminophen (TYLENOL) 325 MG tablet Take 650 mg by mouth every 6 (six) hours as needed.   alendronate (FOSAMAX) 70 MG tablet Take 70 mg by mouth every Sunday. Take on an empty stomach at least 30 mns prior to food. Sit upright x 84ms after taking meds.  atorvastatin (LIPITOR) 10 MG tablet TAKE 1 TABLET EVERY DAY   Calcium Carb-Cholecalciferol (CALCIUM 600+D) 600-10 MG-MCG TABS Take 1 tablet by mouth daily.   cholecalciferol (VITAMIN D3) 25 MCG (1000 UNIT) tablet Take 1,000 Units by mouth daily.   donepezil (ARICEPT) 10 MG tablet TAKE 1 TABLET AT BEDTIME   loratadine (CLARITIN) 10 MG tablet Take 10 mg by mouth daily.   Magnesium Hydroxide (MILK OF MAGNESIA PO) Take 30 mLs by mouth as needed.   mirabegron ER (MYRBETRIQ) 50 MG TB24 tablet Take 50 mg by mouth daily.   NON FORMULARY Diet: Regular diet with chopped meats and gravy to all meats.   omeprazole (PRILOSEC) 40 MG capsule TAKE 1 CAPSULE EVERY DAY   OXYGEN Inhale 2 L into the lungs continuous.   potassium chloride SA (KLOR-CON M) 20 MEQ tablet Take 20 mEq by mouth daily.   STRIVERDI RESPIMAT 2.5 MCG/ACT AERS INHALE 2 PUFFS ONE TIME DAILY AT THE SAME TIME   verapamil (CALAN-SR) 180 MG CR tablet Take 180 mg by mouth daily.   [DISCONTINUED] Nutritional Supplements (ENSURE CLEAR) LIQD Take 1 Can by mouth 2 (two) times daily between meals. Due to weight loss   No facility-administered encounter medications on file as of 11/24/2022.     SIGNIFICANT DIAGNOSTIC EXAMS  LABS REVIEWED PREVIOUS  12-03-21: wbc 3.6; hgb 10.3; hct 33.9; mcv 88.3 plt 220; glucose 87; bun 19; creat 0.92; k+ 4.1; na++ 140; ca 8.9; >60; total protein 6.1 albumin 3.6 01-04-22: wbc 5.8; hgb 10.0; hct 32.9; mcv 89.2 plt 188; glucose 78; bun 0.88; creat 0.88; k+ 3.8; na++ 136; ca 9.2; GFR >60 d-dimer 0.54; CRP 1.6  01-04-22 (ED) wbc10.6; hgb 12.0; hct 40.6; mcv89.8 plt 229; glucose 160; bun 26; creat 1.24; k+ 4.0; na++  136; ca 9.0; GFR 44; protein 7.4 albumin 4.0 01-08-31: wbc 14.6; hgb 9.5; hct 31.9; mcv 86.4 plt 219; glucose 118; bun 17; creat 0.62; k+ 3.5; na++ 139; ca 8.2; GFR>60 protein 6.6; albumin 3.1; mag 2.0; CRP 12.2; d-dimer 0.77  05-21-22: wbc 4.8; hgb 11.; hct 37.5; mcv 87.2 plt 229; glucose 74; bun 26; creat 1.05 ;k+ 3.8; na++ 140; ca 9.2; gfr 54  06-10-22: vitamin D 39.61  TODAY  10-20-22: wbc 13.6; hgb 9.7; hct 32.0; mcv 86.3; plt 205; glucose 09; bun 25; creat 1.20; k+ 3.7; na++ 139; ca 8.5; gfr 45; chol 110; ldl 63; trig 59; hdl 35      Review of Systems  Constitutional:  Negative for malaise/fatigue.  Respiratory:  Negative for cough and shortness of breath.   Cardiovascular:  Negative for chest pain, palpitations and leg swelling.  Gastrointestinal:  Negative for abdominal pain, constipation and heartburn.  Musculoskeletal:  Negative for back pain, joint pain and myalgias.  Skin: Negative.   Neurological:  Negative for dizziness.  Psychiatric/Behavioral:  The patient is not nervous/anxious.    Physical Exam Constitutional:      General: She is not in acute distress.    Appearance: She is well-developed. She is not diaphoretic.  Neck:     Thyroid: No thyromegaly.  Cardiovascular:     Rate and Rhythm: Normal rate and regular rhythm.     Pulses: Normal pulses.     Heart sounds: Murmur heard.  Pulmonary:     Effort: Pulmonary effort is normal. No respiratory distress.     Breath sounds: Normal breath sounds.     Comments: 02 Abdominal:     General: Bowel sounds are normal. There is no distension.  Palpations: Abdomen is soft.     Tenderness: There is no abdominal tenderness.  Musculoskeletal:        General: Normal range of motion.     Cervical back: Neck supple.     Right lower leg: No edema.     Left lower leg: No edema.  Lymphadenopathy:     Cervical: No cervical adenopathy.  Skin:    General: Skin is warm and dry.  Neurological:     Mental Status: She is alert.  Mental status is at baseline.  Psychiatric:        Mood and Affect: Mood normal.       ASSESSMENT/ PLAN:  TODAY  Dysphagia oropharyngeal phase: no indications of aspiration; on thin liquids   2. Vitamin D deficiency: is on 1,000 units  3. Hyperlipidemia LDL goal <100; LDL 62 will continue lipitor 10 mg daily   4. Low thyroid stimulation hormone (tsh): level is 1.075 free t4; 0.93    PREVIOUS   5. 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 Urinary frequency: will continue myrbetriq 50 mg daily   7.  Chronic respiratory failure with hypoxia and hypercapnia COPD is on 02; will continue albuterol 2 puffs every 4 hours as needed; strivirdi 2.5 mcg 2 puffs daily claritin 10 mg daily   8. Essential hypertension b/p 111/67 will continue calan sr 180 mg daily   9. Esophageal reflux disease without esophagitis: will continue prilosec 40 mg daily   10. Vascular dementia without behavioral disturbance/major neurocognitive disorder: weight is 165 pounds; weight is stable will continue aricept 10 mg daily   11. Generalized osteoarthritis of multiple sites: has prn tylenol no reports of uncontrolled pain   12. Age related osteoporosis without current pathological fracture: will continue fosamax 70 mg weekly   13. Stage 3a chronic kidney disease: bun 25; creat 1.20; gfr 45  14. Normocytic anemia: hgb 9.7   Ok Edwards NP [facility]   call [phone]

## 2022-11-25 DIAGNOSIS — Z9181 History of falling: Secondary | ICD-10-CM | POA: Diagnosis not present

## 2022-11-25 DIAGNOSIS — J449 Chronic obstructive pulmonary disease, unspecified: Secondary | ICD-10-CM | POA: Diagnosis not present

## 2022-11-25 DIAGNOSIS — R262 Difficulty in walking, not elsewhere classified: Secondary | ICD-10-CM | POA: Diagnosis not present

## 2022-11-25 DIAGNOSIS — M159 Polyosteoarthritis, unspecified: Secondary | ICD-10-CM | POA: Diagnosis not present

## 2022-11-25 DIAGNOSIS — M6281 Muscle weakness (generalized): Secondary | ICD-10-CM | POA: Diagnosis not present

## 2022-11-26 DIAGNOSIS — Z9181 History of falling: Secondary | ICD-10-CM | POA: Diagnosis not present

## 2022-11-26 DIAGNOSIS — M159 Polyosteoarthritis, unspecified: Secondary | ICD-10-CM | POA: Diagnosis not present

## 2022-11-26 DIAGNOSIS — J449 Chronic obstructive pulmonary disease, unspecified: Secondary | ICD-10-CM | POA: Diagnosis not present

## 2022-11-26 DIAGNOSIS — R262 Difficulty in walking, not elsewhere classified: Secondary | ICD-10-CM | POA: Diagnosis not present

## 2022-11-26 DIAGNOSIS — M6281 Muscle weakness (generalized): Secondary | ICD-10-CM | POA: Diagnosis not present

## 2022-11-29 DIAGNOSIS — M6281 Muscle weakness (generalized): Secondary | ICD-10-CM | POA: Diagnosis not present

## 2022-11-29 DIAGNOSIS — R262 Difficulty in walking, not elsewhere classified: Secondary | ICD-10-CM | POA: Diagnosis not present

## 2022-11-29 DIAGNOSIS — M159 Polyosteoarthritis, unspecified: Secondary | ICD-10-CM | POA: Diagnosis not present

## 2022-11-29 DIAGNOSIS — Z9181 History of falling: Secondary | ICD-10-CM | POA: Diagnosis not present

## 2022-11-29 DIAGNOSIS — J449 Chronic obstructive pulmonary disease, unspecified: Secondary | ICD-10-CM | POA: Diagnosis not present

## 2022-11-30 DIAGNOSIS — J449 Chronic obstructive pulmonary disease, unspecified: Secondary | ICD-10-CM | POA: Diagnosis not present

## 2022-11-30 DIAGNOSIS — Z9181 History of falling: Secondary | ICD-10-CM | POA: Diagnosis not present

## 2022-11-30 DIAGNOSIS — M6281 Muscle weakness (generalized): Secondary | ICD-10-CM | POA: Diagnosis not present

## 2022-11-30 DIAGNOSIS — M159 Polyosteoarthritis, unspecified: Secondary | ICD-10-CM | POA: Diagnosis not present

## 2022-11-30 DIAGNOSIS — R262 Difficulty in walking, not elsewhere classified: Secondary | ICD-10-CM | POA: Diagnosis not present

## 2022-12-01 DIAGNOSIS — Z9181 History of falling: Secondary | ICD-10-CM | POA: Diagnosis not present

## 2022-12-01 DIAGNOSIS — R262 Difficulty in walking, not elsewhere classified: Secondary | ICD-10-CM | POA: Diagnosis not present

## 2022-12-01 DIAGNOSIS — M159 Polyosteoarthritis, unspecified: Secondary | ICD-10-CM | POA: Diagnosis not present

## 2022-12-01 DIAGNOSIS — M6281 Muscle weakness (generalized): Secondary | ICD-10-CM | POA: Diagnosis not present

## 2022-12-01 DIAGNOSIS — E559 Vitamin D deficiency, unspecified: Secondary | ICD-10-CM | POA: Insufficient documentation

## 2022-12-01 DIAGNOSIS — J449 Chronic obstructive pulmonary disease, unspecified: Secondary | ICD-10-CM | POA: Diagnosis not present

## 2022-12-28 ENCOUNTER — Non-Acute Institutional Stay (SKILLED_NURSING_FACILITY): Payer: Medicare PPO | Admitting: Adult Health

## 2022-12-28 ENCOUNTER — Encounter: Payer: Self-pay | Admitting: Adult Health

## 2022-12-28 DIAGNOSIS — N3281 Overactive bladder: Secondary | ICD-10-CM

## 2022-12-28 DIAGNOSIS — F324 Major depressive disorder, single episode, in partial remission: Secondary | ICD-10-CM | POA: Diagnosis not present

## 2022-12-28 DIAGNOSIS — J9612 Chronic respiratory failure with hypercapnia: Secondary | ICD-10-CM

## 2022-12-28 DIAGNOSIS — J9611 Chronic respiratory failure with hypoxia: Secondary | ICD-10-CM | POA: Diagnosis not present

## 2022-12-28 DIAGNOSIS — J449 Chronic obstructive pulmonary disease, unspecified: Secondary | ICD-10-CM

## 2022-12-28 NOTE — Progress Notes (Signed)
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 There are no reports of uncontrolled pain. Her weight is stable. She continues to propel self around facility.   Past Medical History:  Diagnosis Date   Abnormality of gait 06/06/2014   Allergic rhinitis    Anemia    Ankle fracture, right    Anxiety    Arm fracture, left    Arthritis    abnormal gait    COPD (chronic obstructive pulmonary disease) (HCC)    chronic CO2 retention, decreased DLCO    Cystic acne    adult    Degenerative disc disease, cervical    syrinx C3-7   Degenerative disc disease, lumbar    L5 nerve impingement    Dementia (HCC)    Depression    DNR (do not resuscitate)    Fracture of right proximal fibula 07/13/18 09/05/2018   GERD (gastroesophageal reflux disease)    Heme positive stool 10/10/2017   History of colon surgery    right hemicolectomy for high-grade adenomas in right colon 2013   Hyperglycemia    Hypertension    Low back pain    Mediastinal lymphadenopathy 1/9   resolving    Memory difficulty 09/20/2014   mild dementia   Onychomycosis    OSA on CPAP    Sleep apnea    stop bang score 6    Past Surgical History:   Procedure Laterality Date   ABDOMINAL HYSTERECTOMY  1976   secondary to bleeding    APPENDECTOMY     COLON RESECTION  08/18/2012   Procedure: HAND ASSISTED LAPAROSCOPIC COLON RESECTION;  Surgeon: Jamesetta So, MD;  Location: [facility];  Service: General;  Laterality: N/A;   COLONOSCOPY  07/20/2012   Dr. Gala Romney: multiple colonic polyps with large polyps on right side, s/p saline-assisted debulking piecemeal polypectomy and ablation. Not all removed. Path with tubulovillous adenomas, high grade dysplasia.    COLONOSCOPY N/A 03/09/2013   [id number] polyps-tubular adenomas. S/p right hemicolectomy. next tcs 02/2018   epidural steroids     ESOPHAGOGASTRODUODENOSCOPY (EGD) WITH ESOPHAGEAL DILATION N/A 02/06/2014   Procedure: ESOPHAGOGASTRODUODENOSCOPY (EGD) WITH ESOPHAGEAL DILATION;  Surgeon: Daneil Dolin, MD;  Location: [facility];  Service: Endoscopy;  Laterality: N/A;  9:30   OOPHORECTOMY  1976   ORIF ANKLE FRACTURE  01/18/2012   Procedure: OPEN REDUCTION INTERNAL FIXATION (ORIF) ANKLE FRACTURE;  Surgeon: Arther Abbott, MD;  Location: [facility];  Service: Orthopedics;  Laterality: Left;   ORIF ANKLE FRACTURE Right 01/13/2016   Procedure: OPEN REDUCTION INTERNAL FIXATION (ORIF) ANKLE FRACTURE;  Surgeon: Carole Civil, MD;  Location: [facility];  Service: Orthopedics;  Laterality:  Right;    Social History   Socioeconomic History   Marital status: Widowed    Spouse name: Not on file   Number of children: 4   Years of education: college 1   Highest education level: Not on file  Occupational History   Occupation: employed in the tobacco industry     Employer: RETIRED   Occupation: retired  Tobacco Use   Smoking status: Former    Packs/day: 1.50    Years: 40.00    Total pack years: 60.00    Types: Cigarettes    Quit date: 06/21/2006    Years since quitting: 16.5   Smokeless tobacco: Never  Vaping Use   Vaping Use: Never used  Substance and Sexual Activity   Alcohol use: No   Drug use:  No   Sexual activity: Not Currently  Other Topics Concern   Not on file  Social History Narrative   Long term resident of [facility]    Social Determinants of Health   Financial Resource Strain: [place]  (12/02/2020)   Overall Financial Resource Strain (CARDIA)    Difficulty of Paying Living Expenses: Not hard at all  Food Insecurity: No Food Insecurity (12/02/2020)   Hunger Vital Sign    Worried About Running Out of Food in the Last Year: Never true    [place] in the Last Year: Never true  Transportation Needs: No Transportation Needs (12/02/2020)   PRAPARE - Hydrologist (Medical): No    Lack of Transportation (Non-Medical): No  Physical Activity: Inactive (12/02/2020)   Exercise Vital Sign    Days of Exercise per Week: 0 days    Minutes of Exercise per Session: 0 min  Stress: No Stress Concern Present (12/02/2020)   [place]    Feeling of Stress : Not at all  Social Connections: Socially Isolated (12/02/2020)   Social Connection and Isolation Panel [NHANES]    Frequency of Communication with Friends and Family: Three times a week    Frequency of Social Gatherings with Friends and Family: Once a week    Attends Religious Services: Never    Marine scientist or Organizations: No    Attends Archivist Meetings: Never    Marital Status: Widowed  Intimate Partner Violence: Not At Risk (12/02/2020)   Humiliation, Afraid, Rape, and Kick questionnaire    Fear of Current or Ex-Partner: No    Emotionally Abused: No    Physically Abused: No    Sexually Abused: No   Family History  Problem Relation Age of Onset   Stomach cancer Father    Cancer Father    Cancer Mother        brain    Breast cancer Mother    Arthritis Other       VITAL SIGNS BP 124/73   Pulse 80   Temp 97.8 F (36.6 C)   Resp 20   Ht 6' 1"$  (1.854 m)   Wt 164 lb (74.4 kg)   SpO2 97%   BMI 21.64  kg/m   Outpatient Encounter Medications as of 12/28/2022  Medication Sig   acetaminophen (TYLENOL) 325 MG tablet Take 650 mg by mouth every 6 (six) hours as needed.   alendronate (FOSAMAX) 70 MG tablet Take 70 mg by mouth every Sunday. Take on an empty stomach at least 30 mns prior to food. Sit upright x 45ms after taking meds.   atorvastatin (  LIPITOR) 10 MG tablet TAKE 1 TABLET EVERY DAY   Calcium Carb-Cholecalciferol (CALCIUM 600+D) 600-10 MG-MCG TABS Take 1 tablet by mouth daily.   cholecalciferol (VITAMIN D3) 25 MCG (1000 UNIT) tablet Take 1,000 Units by mouth daily.   donepezil (ARICEPT) 10 MG tablet TAKE 1 TABLET AT BEDTIME   loratadine (CLARITIN) 10 MG tablet Take 10 mg by mouth daily.   Magnesium Hydroxide (MILK OF MAGNESIA PO) Take 30 mLs by mouth as needed.   mirabegron ER (MYRBETRIQ) 50 MG TB24 tablet Take 50 mg by mouth daily.   NON FORMULARY Diet: Regular diet with chopped meats and gravy to all meats.   omeprazole (PRILOSEC) 40 MG capsule TAKE 1 CAPSULE EVERY DAY   OXYGEN Inhale 2 L into the lungs continuous.   potassium chloride SA (KLOR-CON M) 20 MEQ tablet Take 20 mEq by mouth daily.   STRIVERDI RESPIMAT 2.5 MCG/ACT AERS INHALE 2 PUFFS ONE TIME DAILY AT THE SAME TIME   verapamil (CALAN-SR) 180 MG CR tablet Take 180 mg by mouth daily.   No facility-administered encounter medications on file as of 12/28/2022.     SIGNIFICANT DIAGNOSTIC EXAMS  LABS REVIEWED PREVIOUS  01-04-22: wbc 5.8; hgb 10.0; hct 32.9; mcv 89.2 plt 188; glucose 78; bun 0.88; creat 0.88; k+ 3.8; na++ 136; ca 9.2; GFR >60 d-dimer 0.54; CRP 1.6  01-04-22 (ED) wbc10.6; hgb 12.0; hct 40.6; mcv89.8 plt 229; glucose 160; bun 26; creat 1.24; k+ 4.0; na++ 136; ca 9.0; GFR 44; protein 7.4 albumin 4.0 01-08-31: wbc 14.6; hgb 9.5; hct 31.9; mcv 86.4 plt 219; glucose 118; bun 17; creat 0.62; k+ 3.5; na++ 139; ca 8.2; GFR>60 protein 6.6; albumin 3.1; mag 2.0; CRP 12.2; d-dimer 0.77  05-21-22: wbc 4.8; hgb 11.; hct 37.5;  mcv 87.2 plt 229; glucose 74; bun 26; creat 1.05 ;k+ 3.8; na++ 140; ca 9.2; gfr 54  06-10-22: vitamin D 39.61 10-20-22: wbc 13.6; hgb 9.7; hct 32.0; mcv 86.3; plt 205; glucose 09; bun 25; creat 1.20; k+ 3.7; na++ 139; ca 8.5; gfr 45; chol 110; ldl 63; trig 59; hdl 35   NO NEW LABS     Review of Systems  Constitutional:  Negative for malaise/fatigue.  Respiratory:  Negative for cough and shortness of breath.   Cardiovascular:  Negative for chest pain, palpitations and leg swelling.  Gastrointestinal:  Negative for abdominal pain, constipation and heartburn.  Musculoskeletal:  Negative for back pain, joint pain and myalgias.  Skin: Negative.   Neurological:  Negative for dizziness.  Psychiatric/Behavioral:  The patient is not nervous/anxious.    Physical Exam Constitutional:      General: She is not in acute distress.    Appearance: She is well-developed. She is not diaphoretic.  Neck:     Thyroid: No thyromegaly.  Cardiovascular:     Rate and Rhythm: Normal rate and regular rhythm.     Pulses: Normal pulses.     Heart sounds: Murmur heard.  Pulmonary:     Effort: Pulmonary effort is normal. No respiratory distress.     Breath sounds: Normal breath sounds.     Comments: 02 Abdominal:     General: Bowel sounds are normal. There is no distension.     Palpations: Abdomen is soft.     Tenderness: There is no abdominal tenderness.  Musculoskeletal:        General: Normal range of motion.     Cervical back: Neck supple.     Right lower leg: No edema.  Left lower leg: No edema.  Lymphadenopathy:     Cervical: No cervical adenopathy.  Skin:    General: Skin is warm and dry.  Neurological:     Mental Status: She is alert. Mental status is at baseline.  Psychiatric:        Mood and Affect: Mood normal.     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 Urinary frequency: will continue myrbetrig 50 mg  daily   3. Chronic respiratory failure with hypoxia and hypercapnia/COPD is on 02 will continue albuterol 2 puffs every 4 hours as needed; strivirdi 2.5 mcg 2 puffs twice daily claritin 10 mg daily    PREVIOUS   4. Essential hypertension b/p 111/67 will continue calan sr 180 mg daily   5. Esophageal reflux disease without esophagitis: will continue prilosec 40 mg daily   6. 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 Generalized osteoarthritis of multiple sites: has prn tylenol no reports of uncontrolled pain   8. Age related osteoporosis without current pathological fracture: will continue fosamax 70 mg weekly   9. Stage 3a chronic kidney disease: bun 25; creat 1.20; gfr 45  10. Normocytic anemia: hgb 9.7  11. Dysphagia oropharyngeal phase: no indications of aspiration; on thin liquids   12. Vitamin D deficiency: is on 1,000 units  13. Hyperlipidemia LDL goal <100; LDL 62 will continue lipitor 10 mg daily   14. Low thyroid stimulation hormone (tsh): level is 1.075 free t4; 0.93    Ok Edwards NP [facility]   call [phone]

## 2023-02-01 ENCOUNTER — Encounter: Payer: Self-pay | Admitting: Internal Medicine

## 2023-02-01 ENCOUNTER — Non-Acute Institutional Stay (SKILLED_NURSING_FACILITY): Payer: Medicare PPO | Admitting: Internal Medicine

## 2023-02-01 DIAGNOSIS — I10 Essential (primary) hypertension: Secondary | ICD-10-CM | POA: Diagnosis not present

## 2023-02-01 DIAGNOSIS — F0150 Vascular dementia without behavioral disturbance: Secondary | ICD-10-CM

## 2023-02-01 DIAGNOSIS — R569 Unspecified convulsions: Secondary | ICD-10-CM | POA: Diagnosis not present

## 2023-02-01 DIAGNOSIS — J9612 Chronic respiratory failure with hypercapnia: Secondary | ICD-10-CM

## 2023-02-01 DIAGNOSIS — J9611 Chronic respiratory failure with hypoxia: Secondary | ICD-10-CM

## 2023-02-01 DIAGNOSIS — D649 Anemia, unspecified: Secondary | ICD-10-CM | POA: Diagnosis not present

## 2023-02-01 DIAGNOSIS — N1831 Chronic kidney disease, stage 3a: Secondary | ICD-10-CM | POA: Diagnosis not present

## 2023-02-01 NOTE — Progress Notes (Signed)
NURSING HOME LOCATION:  [facility] ROOM NUMBER:  71 W  CODE STATUS:  DNR  PCP:  Ok Edwards NP  This is a nursing facility follow up visit of chronic medical diagnoses & to document compliance with Regulation 483.30 (c) in The [place] Manual Phase 2 which mandates caregiver visit ( visits can alternate among physician, PA or NP as per statutes) within 10 days of 30 days / 60 days/ 90 days post admission to SNF date    Interim medical record and care since last SNF visit was updated with review of diagnostic studies and change in clinical status since last visit were documented.  HPI: She is a permanent resident of the facility with diagnoses of oxygen dependent COPD, vascular dementia, GERD, essential hypertension, and OSA. Recent labs were completed 10/20/2022.  Normochromic, normocytic anemia was present with H/H of 9.7/32.  Creatinine was 1.20 with a GFR 45 indicating borderline stage IIIa/IIIb CKD.  On 05/21/2022 H/H has been 11.1/37.5; hypochromia was present at that time.  Creatinine was 1.05 and GFR 54.  This indicates progression of her CKD and anemia.  Review of systems: Dementia invalidated responses.  She denied any active symptoms.  She kept confabulating about "just got over here, going in and out."  She ask how her brother Gus Rankin is , indicating she thought he was in this facility.  He is not.  Constitutional: No fever, significant weight change, fatigue  Eyes: No redness, discharge, pain, vision change ENT/mouth: No nasal congestion,  purulent discharge, earache, change in hearing, sore throat  Cardiovascular: No chest pain, palpitations, paroxysmal nocturnal dyspnea, claudication, edema  Respiratory: No cough, sputum production, hemoptysis, DOE, significant snoring, apnea   Gastrointestinal: No heartburn, dysphagia, abdominal pain, nausea /vomiting, rectal bleeding, melena, change in bowels Genitourinary: No dysuria, hematuria,  pyuria, incontinence, nocturia Musculoskeletal: No joint stiffness, joint swelling, weakness, pain Dermatologic: No rash, pruritus, change in appearance of skin Neurologic: No dizziness, headache, syncope, seizures, numbness, tingling Psychiatric: No significant anxiety, depression, insomnia, anorexia Endocrine: No change in hair/skin/nails, excessive thirst, excessive hunger, excessive urination  Hematologic/lymphatic: No significant bruising, lymphadenopathy, abnormal bleeding Allergy/immunology: No itchy/watery eyes, significant sneezing, urticaria, angioedema  Physical exam:  Pertinent or positive findings: She appears her age and somewhat chronically ill.  She is wearing nasal oxygen.  There is slight exotropia of the left eye.  She has complete dentures.  She had an isolated expiratory rhonchi on the right anterior chest which was not a persistent finding.  Lungs are generally silent.  Heart rhythm is regular and rate is slow.  Abdomen slightly protuberant.  Pedal pulses are decreased.  An anti-wandering bracelet is present over the right ankle.  General appearance:  no acute distress, increased work of breathing is present.   Lymphatic: No lymphadenopathy about the head, neck, axilla. Eyes: No conjunctival inflammation or lid edema is present. There is no scleral icterus. Ears:  External ear exam shows no significant lesions or deformities.   Nose:  External nasal examination shows no deformity or inflammation. Nasal mucosa are pink and moist without lesions, exudates Oral exam:   There is no oropharyngeal erythema or exudate. Neck:  No thyromegaly, masses, tenderness noted.    Heart:  No gallop, murmur, click, rub .  Lungs:  without wheezes, rales, rubs. Abdomen: Bowel sounds are normal. Abdomen is soft and nontender with no organomegaly, hernias, masses. GU: Deferred  Extremities:  No cyanosis, clubbing, edema  Neurologic exam :Balance, [place], finger  to nose testing could not be  completed due to clinical state Skin: Warm & dry w/o tenting. No significant lesions or rash.  See summary under each active problem in the Problem List with associated updated therapeutic plan

## 2023-02-02 DIAGNOSIS — R569 Unspecified convulsions: Secondary | ICD-10-CM | POA: Insufficient documentation

## 2023-02-02 NOTE — Assessment & Plan Note (Signed)
No COPD exacerbation; O2 sats good on present regimen. No change indicated.

## 2023-02-02 NOTE — Patient Instructions (Signed)
See assessment and plan under each diagnosis in the problem list and acutely for this visit 

## 2023-02-02 NOTE — Assessment & Plan Note (Signed)
Slight progression to CKD Stage 3 a/b. Update BMET.

## 2023-02-02 NOTE — Assessment & Plan Note (Signed)
Slight progression of anemia w/o reported bleeding dyscrasias. Update CBC.

## 2023-02-02 NOTE — Assessment & Plan Note (Signed)
BP controlled; no change in antihypertensive medications  

## 2023-02-02 NOTE — Assessment & Plan Note (Signed)
No seizures reported , not on anti-seizure meds. Monitor

## 2023-02-02 NOTE — Assessment & Plan Note (Signed)
Severe dementia w/o reported behavioral issues.

## 2023-02-03 ENCOUNTER — Other Ambulatory Visit (HOSPITAL_COMMUNITY)
Admission: RE | Admit: 2023-02-03 | Discharge: 2023-02-03 | Disposition: A | Discharge status: Home / Self Care | Payer: Medicare PPO | Source: Skilled Nursing Facility | Attending: Adult Health | Admitting: Adult Health

## 2023-02-03 DIAGNOSIS — N1831 Chronic kidney disease, stage 3a: Secondary | ICD-10-CM | POA: Diagnosis not present

## 2023-02-03 LAB — CBC
HCT: 30.9 % — ABNORMAL LOW (ref 36.0–46.0)
Hemoglobin: 9.3 g/dL — ABNORMAL LOW (ref 12.0–15.0)
MCH: 25.9 pg — ABNORMAL LOW (ref 26.0–34.0)
MCHC: 30.1 g/dL (ref 30.0–36.0)
MCV: 86.1 fL (ref 80.0–100.0)
Platelets: 200 10*3/uL (ref 150–400)
RBC: 3.59 MIL/uL — ABNORMAL LOW (ref 3.87–5.11)
RDW: 15.6 % — ABNORMAL HIGH (ref 11.5–15.5)
WBC: 3.4 10*3/uL — ABNORMAL LOW (ref 4.0–10.5)
nRBC: 0 % (ref 0.0–0.2)

## 2023-02-03 LAB — BASIC METABOLIC PANEL
Anion gap: 7 (ref 5–15)
BUN: 24 mg/dL — ABNORMAL HIGH (ref 8–23)
CO2: 29 mmol/L (ref 22–32)
Calcium: 8.7 mg/dL — ABNORMAL LOW (ref 8.9–10.3)
Chloride: 106 mmol/L (ref 98–111)
Creatinine, Ser: 0.81 mg/dL (ref 0.44–1.00)
GFR, Estimated: >60 mL/min (ref >60)
Glucose, Bld: 76 mg/dL (ref 70–99)
Potassium: 3.8 mmol/L (ref 3.5–5.1)
Sodium: 142 mmol/L (ref 135–145)

## 2023-02-04 ENCOUNTER — Encounter: Payer: Self-pay | Admitting: Adult Health

## 2023-02-04 ENCOUNTER — Non-Acute Institutional Stay (SKILLED_NURSING_FACILITY): Payer: Medicare PPO | Admitting: Adult Health

## 2023-02-04 DIAGNOSIS — N1831 Chronic kidney disease, stage 3a: Secondary | ICD-10-CM

## 2023-02-04 DIAGNOSIS — F0390 Unspecified dementia without behavioral disturbance: Secondary | ICD-10-CM

## 2023-02-04 DIAGNOSIS — F0150 Vascular dementia without behavioral disturbance: Secondary | ICD-10-CM | POA: Diagnosis not present

## 2023-02-04 DIAGNOSIS — J449 Chronic obstructive pulmonary disease, unspecified: Secondary | ICD-10-CM

## 2023-02-04 NOTE — Progress Notes (Unsigned)
Location:  [place] Room Number: [id number] Place of Service:  SNF (31)   CODE STATUS: DNR  Allergies  Allergen Reactions   Fenofibrate Nausea And Vomiting   Pravastatin Sodium Nausea And Vomiting   Sulfonamide Derivatives Other (See Comments)    unknown   Ciprofloxacin Rash    Chief Complaint  Patient presents with   Acute Visit    Patient being seen for Care Plan meeting     HPI:    Past Medical History:  Diagnosis Date   Abnormality of gait 06/06/2014   Allergic rhinitis    Anemia    Ankle fracture, right    Anxiety    Arm fracture, left    Arthritis    abnormal gait    COPD (chronic obstructive pulmonary disease) ([place])    chronic CO2 retention, decreased DLCO    Cystic acne    adult    Degenerative disc disease, cervical    syrinx C3-7   Degenerative disc disease, lumbar    L5 nerve impingement    Dementia (HCC)    Depression    DNR (do not resuscitate)    Fracture of right proximal fibula 07/13/18 09/05/2018   GERD (gastroesophageal reflux disease)    Heme positive stool 10/10/2017   History of colon surgery    right hemicolectomy for high-grade adenomas in right colon 2013   Hyperglycemia    Hypertension    Low back pain    Mediastinal lymphadenopathy 1/9   resolving    Memory difficulty 09/20/2014   mild dementia   Onychomycosis    OSA on CPAP    Sleep apnea    stop bang score 6    Past Surgical History:  Procedure Laterality Date   ABDOMINAL HYSTERECTOMY  1976   secondary to bleeding    APPENDECTOMY     COLON RESECTION  08/18/2012   Procedure: HAND ASSISTED LAPAROSCOPIC COLON RESECTION;  Surgeon: Jamesetta So, MD;  Location: [facility];  Service: General;  Laterality: N/A;   COLONOSCOPY  07/20/2012   Dr. Gala Romney: multiple colonic polyps with large polyps on right side, s/p saline-assisted debulking piecemeal polypectomy and ablation. Not all removed. Path with tubulovillous adenomas, high grade dysplasia.    COLONOSCOPY N/A  03/09/2013   [id number] polyps-tubular adenomas. S/p right hemicolectomy. next tcs 02/2018   epidural steroids     ESOPHAGOGASTRODUODENOSCOPY (EGD) WITH ESOPHAGEAL DILATION N/A 02/06/2014   Procedure: ESOPHAGOGASTRODUODENOSCOPY (EGD) WITH ESOPHAGEAL DILATION;  Surgeon: Daneil Dolin, MD;  [facility];  Service: Endoscopy;  Laterality: N/A;  9:30   OOPHORECTOMY  1976   ORIF ANKLE FRACTURE  01/18/2012   Procedure: OPEN REDUCTION INTERNAL FIXATION (ORIF) ANKLE FRACTURE;  Surgeon: Arther Abbott, MD;  Location: [facility];  Service: Orthopedics;  Laterality: Left;   ORIF ANKLE FRACTURE Right 01/13/2016   Procedure: OPEN REDUCTION INTERNAL FIXATION (ORIF) ANKLE FRACTURE;  Surgeon: Carole Civil, MD;  Location: [facility];  Service: Orthopedics;  Laterality: Right;    Social History   Socioeconomic History   Marital status: Widowed    Spouse name: Not on file   Number of children: 4   Years of education: college 1   Highest education level: Not on file  Occupational History   Occupation: employed in the tobacco industry     Employer: RETIRED   Occupation: retired  Tobacco Use   Smoking status: Former    Packs/day: 1.50    Years: 40.00    Additional pack years:  0.00    Total pack years: 60.00    Types: Cigarettes    Quit date: 06/21/2006    Years since quitting: 16.6   Smokeless tobacco: Never  Vaping Use   Vaping Use: Never used  Substance and Sexual Activity   Alcohol use: No   Drug use: No   Sexual activity: Not Currently  Other Topics Concern   Not on file  Social History Narrative   Long term resident of [facility]    Social Determinants of Health   Financial Resource Strain: Low Risk  (12/02/2020)   Overall Financial Resource Strain (CARDIA)    Difficulty of Paying Living Expenses: Not hard at all  Food Insecurity: No Food Insecurity (12/02/2020)   Hunger Vital Sign    Worried About Running Out of Food in the Last Year: Never true    Ran Out of Food in the Last Year:  Never true  Transportation Needs: No Transportation Needs (12/02/2020)   PRAPARE - Hydrologist (Medical): No    Lack of Transportation (Non-Medical): No  Physical Activity: Inactive (12/02/2020)   Exercise Vital Sign    Days of Exercise per Week: 0 days    Minutes of Exercise per Session: 0 min  Stress: No Stress Concern Present (12/02/2020)   [place]    Feeling of Stress : Not at all  Social Connections: Socially Isolated (12/02/2020)   Social Connection and Isolation Panel [NHANES]    Frequency of Communication with Friends and Family: Three times a week    Frequency of Social Gatherings with Friends and Family: Once a week    Attends Religious Services: Never    Marine scientist or Organizations: No    Attends Archivist Meetings: Never    Marital Status: Widowed  Intimate Partner Violence: Not At Risk (12/02/2020)   Humiliation, Afraid, Rape, and Kick questionnaire    Fear of Current or Ex-Partner: No    Emotionally Abused: No    Physically Abused: No    Sexually Abused: No   Family History  Problem Relation Age of Onset   Stomach cancer Father    Cancer Father    Cancer Mother        brain    Breast cancer Mother    Arthritis Other       VITAL SIGNS BP 112/68   Pulse 78   Temp 97.9 F (36.6 C) (Temporal)   Resp 20   Ht 6\' 1"  (1.854 m)   Wt 161 lb 3.2 oz (73.1 kg)   SpO2 95%   BMI 21.27 kg/m   Outpatient Encounter Medications as of 02/04/2023  Medication Sig   acetaminophen (TYLENOL) 325 MG tablet Take 650 mg by mouth every 6 (six) hours as needed.   alendronate (FOSAMAX) 70 MG tablet Take 70 mg by mouth every Sunday. Take on an empty stomach at least 30 mns prior to food. Sit upright x 63mns after taking meds.   atorvastatin (LIPITOR) 10 MG tablet TAKE 1 TABLET EVERY DAY   Calcium Carb-Cholecalciferol (CALCIUM 600+D) 600-10 MG-MCG TABS Take 1 tablet  by mouth daily.   donepezil (ARICEPT) 10 MG tablet TAKE 1 TABLET AT BEDTIME   loratadine (CLARITIN) 10 MG tablet Take 10 mg by mouth daily.   mirabegron ER (MYRBETRIQ) 50 MG TB24 tablet Take 50 mg by mouth daily.   NON FORMULARY Diet: Regular diet with chopped meats and gravy to all meats.  omeprazole (PRILOSEC) 40 MG capsule TAKE 1 CAPSULE EVERY DAY   OXYGEN Inhale 2 L into the lungs continuous.   potassium chloride SA (KLOR-CON M) 20 MEQ tablet Take 20 mEq by mouth daily.   STRIVERDI RESPIMAT 2.5 MCG/ACT AERS INHALE 2 PUFFS ONE TIME DAILY AT THE SAME TIME   verapamil (CALAN-SR) 180 MG CR tablet Take 180 mg by mouth daily.   cholecalciferol (VITAMIN D3) 25 MCG (1000 UNIT) tablet Take 1,000 Units by mouth daily. (Patient not taking: Reported on 02/04/2023)   Magnesium Hydroxide (MILK OF MAGNESIA PO) Take 30 mLs by mouth as needed. (Patient not taking: Reported on 02/04/2023)   No facility-administered encounter medications on file as of 02/04/2023.     SIGNIFICANT DIAGNOSTIC EXAMS       ASSESSMENT/ PLAN:     Ok Edwards NP [facility]  Contact [phone] Monday through Friday 8am- 5pm  After hours call [phone]

## 2023-02-07 ENCOUNTER — Encounter: Payer: Self-pay | Admitting: Adult Health

## 2023-02-07 NOTE — Progress Notes (Signed)
This encounter was created in error - please disregard.

## 2023-02-07 NOTE — Progress Notes (Signed)
Location:  [facility] Room Number: 149 Place of Service:  SNF (31)   CODE STATUS: dnr  Allergies  Allergen Reactions   Fenofibrate Nausea And Vomiting   Pravastatin Sodium Nausea And Vomiting   Sulfonamide Derivatives Other (See Comments)    unknown   Ciprofloxacin Rash    Chief Complaint  Patient presents with   Acute Visit    Care plan meeting     HPI:  We have come together for her care plan meeting. BIMS 5/15 mood 0/30. She is nonambulatory with 2 falls without injury. She requires mod assist with her adls. She is incontinent of bladder and bowel. She requires set up of meals is on regular chopped meat with gravy diet; good appetite; weight is 161.2 pounds. Therapy: none at this time. Activities: passive involvement in activities; short attentions span. 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  Past Medical History:  Diagnosis Date   Abnormality of gait 06/06/2014   Allergic rhinitis    Anemia    Ankle fracture, right    Anxiety    Arm fracture, left    Arthritis    abnormal gait    COPD (chronic obstructive pulmonary disease) (HCC)    chronic CO2 retention, decreased DLCO    Cystic acne    adult    Degenerative disc disease, cervical    syrinx C3-7   Degenerative disc disease, lumbar    L5 nerve impingement    Dementia (HCC)    Depression    DNR (do not resuscitate)    Fracture of right proximal fibula 07/13/18 09/05/2018   GERD (gastroesophageal reflux disease)    Heme positive stool 10/10/2017   History of colon surgery    right hemicolectomy for high-grade adenomas in right colon 2013   Hyperglycemia    Hypertension    Low back pain    Mediastinal lymphadenopathy 1/9   resolving    Memory difficulty 09/20/2014   mild dementia   Onychomycosis    OSA on CPAP    Sleep  apnea    stop bang score 6    Past Surgical History:  Procedure Laterality Date   ABDOMINAL HYSTERECTOMY  1976   secondary to bleeding    APPENDECTOMY     COLON RESECTION  08/18/2012   Procedure: HAND ASSISTED LAPAROSCOPIC COLON RESECTION;  Surgeon: Jamesetta So, MD;  Location: [facility];  Service: General;  Laterality: N/A;   COLONOSCOPY  07/20/2012   Dr. Gala Romney: multiple colonic polyps with large polyps on right side, s/p saline-assisted debulking piecemeal polypectomy and ablation. Not all removed. Path with tubulovillous adenomas, high grade dysplasia.    COLONOSCOPY N/A 03/09/2013   [id number] polyps-tubular adenomas. S/p right hemicolectomy. next tcs 02/2018   epidural steroids     ESOPHAGOGASTRODUODENOSCOPY (EGD) WITH ESOPHAGEAL DILATION N/A 02/06/2014   Procedure: ESOPHAGOGASTRODUODENOSCOPY (EGD) WITH ESOPHAGEAL DILATION;  Surgeon: Daneil Dolin, MD;  Location: [facility];  Service: Endoscopy;  Laterality: N/A;  9:30   OOPHORECTOMY  1976   ORIF ANKLE FRACTURE  01/18/2012   Procedure: OPEN REDUCTION INTERNAL FIXATION (ORIF) ANKLE FRACTURE;  Surgeon: Arther Abbott, MD;  Location: [facility];  Service: Orthopedics;  Laterality: Left;   ORIF ANKLE FRACTURE Right 01/13/2016   Procedure: OPEN REDUCTION INTERNAL FIXATION (ORIF) ANKLE FRACTURE;  Surgeon: Carole Civil, MD;  Location: [facility];  Service: Orthopedics;  Laterality: Right;    Social History   Socioeconomic History   Marital status: Widowed    Spouse name: Not on file   Number of children: 4   Years of education: college 1   Highest education level: Not on file  Occupational History   Occupation: employed in the tobacco industry     Employer: RETIRED   Occupation: retired  Tobacco Use   Smoking status: Former    Packs/day: 1.50    Years: 40.00    Additional pack years: 0.00    Total pack years: 60.00    Types: Cigarettes    Quit date: 06/21/2006    Years since quitting: 16.6   Smokeless tobacco: Never  Vaping Use    Vaping Use: Never used  Substance and Sexual Activity   Alcohol use: No   Drug use: No   Sexual activity: Not Currently  Other Topics Concern   Not on file  Social History Narrative   Long term resident of [facility]    Social Determinants of Health   Financial Resource Strain: Low Risk  (12/02/2020)   Overall Financial Resource Strain (CARDIA)    Difficulty of Paying Living Expenses: Not hard at all  Food Insecurity: No Food Insecurity (12/02/2020)   Hunger Vital Sign    Worried About Running Out of Food in the Last Year: Never true    [place] in the Last Year: Never true  Transportation Needs: No Transportation Needs (12/02/2020)   PRAPARE - Hydrologist (Medical): No    Lack of Transportation (Non-Medical): No  Physical Activity: Inactive (12/02/2020)   Exercise Vital Sign    Days of Exercise per Week: 0 days    Minutes of Exercise per Session: 0 min  Stress: No Stress Concern Present (12/02/2020)   [place]    Feeling of Stress : Not at all  Social Connections: Socially Isolated (12/02/2020)   Social Connection and Isolation Panel [NHANES]    Frequency of Communication with Friends and Family: Three times a week    Frequency of Social Gatherings with Friends and Family: Once a week    Attends Religious Services: Never    Marine scientist or Organizations: No    Attends Archivist Meetings: Never    Marital Status: Widowed  Intimate Partner Violence: Not At Risk (12/02/2020)   Humiliation, Afraid, Rape, and Kick questionnaire    Fear of Current or Ex-Partner: No    Emotionally Abused: No    Physically Abused: No    Sexually Abused: No   Family History  Problem Relation Age of Onset   Stomach cancer Father    Cancer Father    Cancer Mother        brain    Breast cancer Mother    Arthritis Other       VITAL SIGNS BP (!) 126/58   Pulse 81   Temp (!)  97 F (36.1 C)   Resp 18   Ht 6\' 1"  (1.854 m)   Wt 161 lb 3.2 oz (73.1 kg)   SpO2 97%   BMI 21.27 kg/m   Outpatient Encounter Medications as of 02/04/2023  Medication Sig   acetaminophen (TYLENOL) 325 MG tablet Take 650 mg by mouth every 6 (six) hours as needed.   alendronate (FOSAMAX) 70 MG tablet Take 70 mg by mouth every Sunday. Take on an empty stomach at least  30 mns prior to food. Sit upright x 49mns after taking meds.   atorvastatin (LIPITOR) 10 MG tablet TAKE 1 TABLET EVERY DAY   Calcium Carb-Cholecalciferol (CALCIUM 600+D) 600-10 MG-MCG TABS Take 1 tablet by mouth daily.   cholecalciferol (VITAMIN D3) 25 MCG (1000 UNIT) tablet Take 1,000 Units by mouth daily. (Patient not taking: Reported on 02/04/2023)   donepezil (ARICEPT) 10 MG tablet TAKE 1 TABLET AT BEDTIME   loratadine (CLARITIN) 10 MG tablet Take 10 mg by mouth daily.   Magnesium Hydroxide (MILK OF MAGNESIA PO) Take 30 mLs by mouth as needed. (Patient not taking: Reported on 02/04/2023)   mirabegron ER (MYRBETRIQ) 50 MG TB24 tablet Take 50 mg by mouth daily.   NON FORMULARY Diet: Regular diet with chopped meats and gravy to all meats.   omeprazole (PRILOSEC) 40 MG capsule TAKE 1 CAPSULE EVERY DAY   OXYGEN Inhale 2 L into the lungs continuous.   potassium chloride SA (KLOR-CON M) 20 MEQ tablet Take 20 mEq by mouth daily.   STRIVERDI RESPIMAT 2.5 MCG/ACT AERS INHALE 2 PUFFS ONE TIME DAILY AT THE SAME TIME   verapamil (CALAN-SR) 180 MG CR tablet Take 180 mg by mouth daily.   No facility-administered encounter medications on file as of 02/04/2023.     SIGNIFICANT DIAGNOSTIC EXAMS   LABS REVIEWED PREVIOUS  05-21-22: wbc 4.8; hgb 11.; hct 37.5; mcv 87.2 plt 229; glucose 74; bun 26; creat 1.05 ;k+ 3.8; na++ 140; ca 9.2; gfr 54  06-10-22: vitamin D 39.61 10-20-22: wbc 13.6; hgb 9.7; hct 32.0; mcv 86.3; plt 205; glucose 09; bun 25; creat 1.20; k+ 3.7; na++ 139; ca 8.5; gfr 45; chol 110; ldl 63; trig 59; hdl 35   NO NEW LABS      Review of Systems  Constitutional:  Negative for malaise/fatigue.  Respiratory:  Negative for cough and shortness of breath.   Cardiovascular:  Negative for chest pain, palpitations and leg swelling.  Gastrointestinal:  Negative for abdominal pain, constipation and heartburn.  Musculoskeletal:  Negative for back pain, joint pain and myalgias.  Skin: Negative.   Neurological:  Negative for dizziness.  Psychiatric/Behavioral:  The patient is not nervous/anxious.    Physical Exam Constitutional:      General: She is not in acute distress.    Appearance: She is well-developed. She is not diaphoretic.  Neck:     Thyroid: No thyromegaly.  Cardiovascular:     Rate and Rhythm: Normal rate and regular rhythm.     Pulses: Normal pulses.     Heart sounds: Murmur heard.  Pulmonary:     Effort: Pulmonary effort is normal. No respiratory distress.     Breath sounds: Normal breath sounds.     Comments: 02 Abdominal:     General: Bowel sounds are normal. There is no distension.     Palpations: Abdomen is soft.     Tenderness: There is no abdominal tenderness.  Musculoskeletal:        General: Normal range of motion.     Cervical back: Neck supple.     Right lower leg: No edema.     Left lower leg: No edema.  Lymphadenopathy:     Cervical: No cervical adenopathy.  Skin:    General: Skin is warm and dry.  Neurological:     Mental Status: She is alert. Mental status is at baseline.  Psychiatric:        Mood and Affect: Mood normal.      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   Time spent with patient 40 minutes: falls; dietary; medications.    Ok Edwards NP [facility]  call [phone]

## 2023-02-28 DIAGNOSIS — H524 Presbyopia: Secondary | ICD-10-CM | POA: Diagnosis not present

## 2023-02-28 DIAGNOSIS — H2513 Age-related nuclear cataract, bilateral: Secondary | ICD-10-CM | POA: Diagnosis not present

## 2023-03-07 DIAGNOSIS — B351 Tinea unguium: Secondary | ICD-10-CM | POA: Diagnosis not present

## 2023-03-07 DIAGNOSIS — I739 Peripheral vascular disease, unspecified: Secondary | ICD-10-CM | POA: Diagnosis not present

## 2023-03-07 DIAGNOSIS — M2041 Other hammer toe(s) (acquired), right foot: Secondary | ICD-10-CM | POA: Diagnosis not present

## 2023-03-07 DIAGNOSIS — M2042 Other hammer toe(s) (acquired), left foot: Secondary | ICD-10-CM | POA: Diagnosis not present

## 2023-03-08 ENCOUNTER — Non-Acute Institutional Stay (SKILLED_NURSING_FACILITY): Payer: Medicare PPO | Admitting: Adult Health

## 2023-03-08 ENCOUNTER — Encounter: Payer: Self-pay | Admitting: Adult Health

## 2023-03-08 DIAGNOSIS — F0390 Unspecified dementia without behavioral disturbance: Secondary | ICD-10-CM

## 2023-03-08 DIAGNOSIS — F0150 Vascular dementia without behavioral disturbance: Secondary | ICD-10-CM | POA: Diagnosis not present

## 2023-03-08 DIAGNOSIS — K219 Gastro-esophageal reflux disease without esophagitis: Secondary | ICD-10-CM

## 2023-03-08 DIAGNOSIS — I10 Essential (primary) hypertension: Secondary | ICD-10-CM | POA: Diagnosis not present

## 2023-03-08 NOTE — Progress Notes (Signed)
Location:  [facility] Room Number: 149-W Place of Service:  SNF (31) Provider: Synthia Innocent, NP  CODE STATUS: DNR  Allergies  Allergen Reactions   Fenofibrate Nausea And Vomiting   Pravastatin Sodium Nausea And Vomiting   Sulfonamide Derivatives Other (See Comments)    unknown   Ciprofloxacin Rash    Chief Complaint  Patient presents with   Medical Management of Chronic Issues                      Essential hypertension: Esophageal reflux disease without esophagitis:  Vascular dementia without behavioral disturbance/major neurocognitive disorder    HPI:  She is a 82 year old long term resident of this facility being seen for the management of her chronic illnesses;  Essential hypertension: Esophageal reflux disease without esophagitis:  Vascular dementia without behavioral disturbance/major neurocognitive disorder. There are no reports of uncontrolled pain. Her weight is stable. No reports of anxiety or agitation.   Past Medical History:  Diagnosis Date   Abnormality of gait 06/06/2014   Allergic rhinitis    Anemia    Ankle fracture, right    Anxiety    Arm fracture, left    Arthritis    abnormal gait    COPD (chronic obstructive pulmonary disease)    chronic CO2 retention, decreased DLCO    Cystic acne    adult    Degenerative disc disease, cervical    syrinx C3-7   Degenerative disc disease, lumbar    L5 nerve impingement    Dementia    Depression    DNR (do not resuscitate)    Fracture of right proximal fibula 07/13/18 09/05/2018   GERD (gastroesophageal reflux disease)    Heme positive stool 10/10/2017   History of colon surgery    right hemicolectomy for high-grade adenomas in right colon 2013   Hyperglycemia    Hypertension    Low back pain    Mediastinal lymphadenopathy 1/9   resolving    Memory difficulty 09/20/2014   mild dementia   Onychomycosis    OSA on CPAP    Sleep apnea    stop bang score 6    Past Surgical  History:  Procedure Laterality Date   ABDOMINAL HYSTERECTOMY  1976   secondary to bleeding    APPENDECTOMY     COLON RESECTION  08/18/2012   Procedure: HAND ASSISTED LAPAROSCOPIC COLON RESECTION;  Surgeon: Dalia Heading, MD;  Location: [facility];  Service: General;  Laterality: N/A;   COLONOSCOPY  07/20/2012   Dr. Jena Gauss: multiple colonic polyps with large polyps on right side, s/p saline-assisted debulking piecemeal polypectomy and ablation. Not all removed. Path with tubulovillous adenomas, high grade dysplasia.    COLONOSCOPY N/A 03/09/2013   [id number] polyps-tubular adenomas. S/p right hemicolectomy. next tcs 02/2018   epidural steroids     ESOPHAGOGASTRODUODENOSCOPY (EGD) WITH ESOPHAGEAL DILATION N/A 02/06/2014   Procedure: ESOPHAGOGASTRODUODENOSCOPY (EGD) WITH ESOPHAGEAL DILATION;  Surgeon: Corbin Ade, MD;  Location: [facility];  Service: Endoscopy;  Laterality: N/A;  9:30   OOPHORECTOMY  1976   ORIF ANKLE FRACTURE  01/18/2012   Procedure: OPEN REDUCTION INTERNAL FIXATION (ORIF) ANKLE FRACTURE;  Surgeon: Fuller Canada, MD;  Location: [facility];  Service: Orthopedics;  Laterality: Left;   ORIF ANKLE FRACTURE Right 01/13/2016   Procedure: OPEN REDUCTION INTERNAL FIXATION (ORIF) ANKLE FRACTURE;  Surgeon: Vickki Hearing, MD;  Location: [facility];  Service: Orthopedics;  Laterality: Right;    Social History  Socioeconomic History   Marital status: Widowed    Spouse name: Not on file   Number of children: 4   Years of education: college 1   Highest education level: Not on file  Occupational History   Occupation: employed in the tobacco industry     Employer: RETIRED   Occupation: retired  Tobacco Use   Smoking status: Former    Packs/day: 1.50    Years: 40.00    Additional pack years: 0.00    Total pack years: 60.00    Types: Cigarettes    Quit date: 06/21/2006    Years since quitting: 16.7   Smokeless tobacco: Never  Vaping Use   Vaping Use: Never used  Substance and Sexual  Activity   Alcohol use: No   Drug use: No   Sexual activity: Not Currently  Other Topics Concern   Not on file  Social History Narrative   Long term resident of [facility]    Social Determinants of Health   Financial Resource Strain: Low Risk  (12/02/2020)   Overall Financial Resource Strain (CARDIA)    Difficulty of Paying Living Expenses: Not hard at all  Food Insecurity: No Food Insecurity (12/02/2020)   Hunger Vital Sign    Worried About Running Out of Food in the Last Year: Never true    Ran Out of Food in the Last Year: Never true  Transportation Needs: No Transportation Needs (12/02/2020)   PRAPARE - Administrator, Civil Service (Medical): No    Lack of Transportation (Non-Medical): No  Physical Activity: Inactive (12/02/2020)   Exercise Vital Sign    Days of Exercise per Week: 0 days    Minutes of Exercise per Session: 0 min  Stress: No Stress Concern Present (12/02/2020)   [employer] of Occupational Health - Occupational Stress Questionnaire    Feeling of Stress : Not at all  Social Connections: Socially Isolated (12/02/2020)   Social Connection and Isolation Panel [NHANES]    Frequency of Communication with Friends and Family: Three times a week    Frequency of Social Gatherings with Friends and Family: Once a week    Attends Religious Services: Never    Database administrator or Organizations: No    Attends Banker Meetings: Never    Marital Status: Widowed  Intimate Partner Violence: Not At Risk (12/02/2020)   Humiliation, Afraid, Rape, and Kick questionnaire    Fear of Current or Ex-Partner: No    Emotionally Abused: No    Physically Abused: No    Sexually Abused: No   Family History  Problem Relation Age of Onset   Stomach cancer Father    Cancer Father    Cancer Mother        brain    Breast cancer Mother    Arthritis Other       VITAL SIGNS BP 125/69   Pulse 91   Temp 98 F (36.7 C)   Resp 20   Ht 6\' 1"  (1.854 m)   Wt  161 lb (73 kg)   SpO2 100%   BMI 21.24 kg/m   Outpatient Encounter Medications as of 03/08/2023  Medication Sig   calcium-vitamin D (OSCAL WITH D) 500-5 MG-MCG tablet Take 1 tablet by mouth daily at 12 noon.   donepezil (ARICEPT) 10 MG tablet TAKE 1 TABLET AT BEDTIME   loratadine (CLARITIN) 10 MG tablet Take 10 mg by mouth daily.   mirabegron ER (MYRBETRIQ) 50 MG TB24 tablet Take 50  mg by mouth daily.   NON FORMULARY Diet: Regular diet with chopped meats and gravy to all meats.   omeprazole (PRILOSEC) 40 MG capsule TAKE 1 CAPSULE EVERY DAY   OXYGEN Inhale 2 L into the lungs continuous.   potassium chloride SA (KLOR-CON M) 20 MEQ tablet Take 20 mEq by mouth daily.   STRIVERDI RESPIMAT 2.5 MCG/ACT AERS INHALE 2 PUFFS ONE TIME DAILY AT THE SAME TIME   verapamil (CALAN-SR) 180 MG CR tablet Take 180 mg by mouth daily.   [DISCONTINUED] acetaminophen (TYLENOL) 325 MG tablet Take 650 mg by mouth every 6 (six) hours as needed.   [DISCONTINUED] alendronate (FOSAMAX) 70 MG tablet Take 70 mg by mouth every Sunday. Take on an empty stomach at least 30 mns prior to food. Sit upright x after taking meds.   [DISCONTINUED] atorvastatin (LIPITOR) 10 MG tablet TAKE 1 TABLET EVERY DAY   [DISCONTINUED] Calcium Carb-Cholecalciferol (CALCIUM 600+D) 600-10 MG-MCG TABS Take 1 tablet by mouth daily.   [DISCONTINUED] cholecalciferol (VITAMIN D3) 25 MCG (1000 UNIT) tablet Take 1,000 Units by mouth daily. (Patient not taking: Reported on 02/04/2023)   [DISCONTINUED] Magnesium Hydroxide (MILK OF MAGNESIA PO) Take 30 mLs by mouth as needed. (Patient not taking: Reported on 02/04/2023)   No facility-administered encounter medications on file as of 03/08/2023.     SIGNIFICANT DIAGNOSTIC EXAMS  LABS REVIEWED PREVIOUS  05-21-22: wbc 4.8; hgb 11.; hct 37.5; mcv 87.2 plt 229; glucose 74; bun 26; creat 1.05 ;k+ 3.8; na++ 140; ca 9.2; gfr 54  06-10-22: vitamin D 39.61 10-20-22: wbc 13.6; hgb 9.7; hct 32.0; mcv 86.3; plt  205; glucose 09; bun 25; creat 1.20; k+ 3.7; na++ 139; ca 8.5; gfr 45; chol 110; ldl 63; trig 59; hdl 35   TODAY  10-28-22: chol 110; ldl 63; trig 59; hdl 35 02-03-23; wbc 3.4; hgb 9.3; hct 30.9; mcv 86.1 plt 200; glucose 72; bun 24; creat 0.81; k+ 3.8; na++ 142; ca 8.7; gfr >60      Review of Systems  Constitutional:  Negative for malaise/fatigue.  Respiratory:  Negative for cough and shortness of breath.   Cardiovascular:  Negative for chest pain, palpitations and leg swelling.  Gastrointestinal:  Negative for abdominal pain, constipation and heartburn.  Musculoskeletal:  Negative for back pain, joint pain and myalgias.  Skin: Negative.   Neurological:  Negative for dizziness.  Psychiatric/Behavioral:  The patient is not nervous/anxious.     Physical Exam Constitutional:      General: She is not in acute distress.    Appearance: She is well-developed. She is not diaphoretic.  Neck:     Thyroid: No thyromegaly.  Cardiovascular:     Rate and Rhythm: Normal rate and regular rhythm.     Pulses: Normal pulses.     Heart sounds: Murmur heard.  Pulmonary:     Effort: Pulmonary effort is normal. No respiratory distress.     Breath sounds: Normal breath sounds.     Comments: 02 Abdominal:     General: Bowel sounds are normal. There is no distension.     Palpations: Abdomen is soft.     Tenderness: There is no abdominal tenderness.  Musculoskeletal:        General: Normal range of motion.     Cervical back: Neck supple.     Right lower leg: No edema.     Left lower leg: No edema.  Lymphadenopathy:     Cervical: No cervical adenopathy.  Skin:    General:  Skin is warm and dry.  Neurological:     Mental Status: She is alert. Mental status is at baseline.  Psychiatric:        Mood and Affect: Mood normal.     ASSESSMENT/ PLAN:  TODAY  Essential hypertension: b/p 125/69 will continue calan sr 180 mg daily   2. Esophageal reflux disease without esophagitis: will continue  prilosec 40 mg daily   3. Vascular dementia without behavioral disturbance/major neurocognitive disorder: weight is 161 pounds; is stable will continue aricept 10 mg daily   PREVIOUS   4. Generalized osteoarthritis of multiple sites: has prn tylenol no reports of uncontrolled pain   5. Age related osteoporosis without current pathological fracture: will monitor  6. Stage 3a chronic kidney disease: bun 24; creat 0.81; gfr >60  7. Normocytic anemia: hgb 9.7  8. Dysphagia oropharyngeal phase: no indications of aspiration; on thin liquids   9. Vitamin D deficiency: is on 1,000 units  10. Hyperlipidemia LDL goal <100; LDL 63 will continue lipitor 10 mg daily   11. Low thyroid stimulation hormone (tsh): level is 1.075 free t4; 0.93   12. 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 Urinary frequency: will continue myrbetrig 50 mg daily   14. Chronic respiratory failure with hypoxia and hypercapnia/COPD is on 02 will continue albuterol 2 puffs every 4 hours as needed; strivirdi 2.5 mcg 2 puffs twice daily claritin 10 mg daily    Will check cbc; cmp; tsh; vitamin B 12 and d    Synthia Innocent NP [facility]   call [phone]

## 2023-03-10 ENCOUNTER — Other Ambulatory Visit (HOSPITAL_COMMUNITY)
Admission: RE | Admit: 2023-03-10 | Discharge: 2023-03-10 | Disposition: A | Discharge status: Home / Self Care | Payer: Medicare PPO | Source: Skilled Nursing Facility | Attending: Adult Health | Admitting: Adult Health

## 2023-03-10 DIAGNOSIS — F0150 Vascular dementia without behavioral disturbance: Secondary | ICD-10-CM | POA: Diagnosis not present

## 2023-03-10 DIAGNOSIS — N1831 Chronic kidney disease, stage 3a: Secondary | ICD-10-CM | POA: Diagnosis not present

## 2023-03-10 LAB — CBC
HCT: 32.7 % — ABNORMAL LOW (ref 36.0–46.0)
Hemoglobin: 9.9 g/dL — ABNORMAL LOW (ref 12.0–15.0)
MCH: 25.8 pg — ABNORMAL LOW (ref 26.0–34.0)
MCHC: 30.3 g/dL (ref 30.0–36.0)
MCV: 85.4 fL (ref 80.0–100.0)
Platelets: 210 10*3/uL (ref 150–400)
RBC: 3.83 MIL/uL — ABNORMAL LOW (ref 3.87–5.11)
RDW: 15.4 % (ref 11.5–15.5)
WBC: 4.4 10*3/uL (ref 4.0–10.5)
nRBC: 0 % (ref 0.0–0.2)

## 2023-03-10 LAB — COMPREHENSIVE METABOLIC PANEL
ALT: 10 U/L (ref 0–44)
AST: 12 U/L — ABNORMAL LOW (ref 15–41)
Albumin: 3.4 g/dL — ABNORMAL LOW (ref 3.5–5.0)
Alkaline Phosphatase: 54 U/L (ref 38–126)
Anion gap: 5 (ref 5–15)
BUN: 22 mg/dL (ref 8–23)
CO2: 29 mmol/L (ref 22–32)
Calcium: 9.1 mg/dL (ref 8.9–10.3)
Chloride: 105 mmol/L (ref 98–111)
Creatinine, Ser: 0.79 mg/dL (ref 0.44–1.00)
GFR, Estimated: >60 mL/min (ref >60)
Glucose, Bld: 80 mg/dL (ref 70–99)
Potassium: 4 mmol/L (ref 3.5–5.1)
Sodium: 139 mmol/L (ref 135–145)
Total Bilirubin: 0.4 mg/dL (ref 0.3–1.2)
Total Protein: 6.1 g/dL — ABNORMAL LOW (ref 6.5–8.1)

## 2023-03-10 LAB — VITAMIN B12: Vitamin B-12: 250 pg/mL (ref 180–914)

## 2023-03-10 LAB — TSH: TSH: 0.918 u[IU]/mL (ref 0.350–4.500)

## 2023-03-10 LAB — VITAMIN D 25 HYDROXY (VIT D DEFICIENCY, FRACTURES): Vit D, 25-Hydroxy: 42.92 ng/mL (ref 30–100)

## 2023-03-24 ENCOUNTER — Encounter: Payer: Self-pay | Admitting: Adult Health

## 2023-03-24 ENCOUNTER — Non-Acute Institutional Stay (SKILLED_NURSING_FACILITY): Payer: Medicare PPO | Admitting: Adult Health

## 2023-03-24 DIAGNOSIS — F0390 Unspecified dementia without behavioral disturbance: Secondary | ICD-10-CM

## 2023-03-24 DIAGNOSIS — F0150 Vascular dementia without behavioral disturbance: Secondary | ICD-10-CM | POA: Diagnosis not present

## 2023-03-24 DIAGNOSIS — Y92129 Unspecified place in nursing home as the place of occurrence of the external cause: Secondary | ICD-10-CM | POA: Diagnosis not present

## 2023-03-24 DIAGNOSIS — W19XXXA Unspecified fall, initial encounter: Secondary | ICD-10-CM

## 2023-03-24 NOTE — Progress Notes (Signed)
Location:  [facility] Room Number: 149-W Place of Service:  SNF (31)   CODE STATUS:DNR  Allergies  Allergen Reactions   Fenofibrate Nausea And Vomiting   Pravastatin Sodium Nausea And Vomiting   Sulfonamide Derivatives Other (See Comments)    unknown   Ciprofloxacin Rash    Chief Complaint  Patient presents with   Acute Visit    Fall    HPI:  She has had 7 falls since Nov 2023. 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 The rate of her falls have diminished since starting this medication. She is falling less frequently. There have been no injuries with her falls. She has not had a fall since March of this year. There are no reports of getting up in the middle of the night.   Past Medical History:  Diagnosis Date   Abnormality of gait 06/06/2014   Allergic rhinitis    Anemia    Ankle fracture, right    Anxiety    Arm fracture, left    Arthritis    abnormal gait    COPD (chronic obstructive pulmonary disease) (HCC)    chronic CO2 retention, decreased DLCO    Cystic acne    adult    Degenerative disc disease, cervical    syrinx C3-7   Degenerative disc disease, lumbar    L5 nerve impingement    Dementia (HCC)    Depression    DNR (do not resuscitate)    Fracture of right proximal fibula 07/13/18 09/05/2018   GERD (gastroesophageal reflux disease)    Heme positive stool 10/10/2017   History of colon surgery    right hemicolectomy for high-grade adenomas in right colon 2013   Hyperglycemia    Hypertension    Low back pain    Mediastinal lymphadenopathy 1/9   resolving    Memory difficulty 09/20/2014   mild dementia   Onychomycosis    OSA on CPAP    Sleep apnea    stop bang score 6    Past Surgical History:  Procedure Laterality Date   ABDOMINAL HYSTERECTOMY  1976   secondary to bleeding    APPENDECTOMY     COLON RESECTION  08/18/2012   Procedure: HAND ASSISTED LAPAROSCOPIC COLON RESECTION;  Surgeon: Dalia Heading, MD;   Location: [facility];  Service: General;  Laterality: N/A;   COLONOSCOPY  07/20/2012   Dr. Jena Gauss: multiple colonic polyps with large polyps on right side, s/p saline-assisted debulking piecemeal polypectomy and ablation. Not all removed. Path with tubulovillous adenomas, high grade dysplasia.    COLONOSCOPY N/A 03/09/2013   ZOX:[id number] polyps-tubular adenomas. S/p right hemicolectomy. next tcs 02/2018   epidural steroids     ESOPHAGOGASTRODUODENOSCOPY (EGD) WITH ESOPHAGEAL DILATION N/A 02/06/2014   Procedure: ESOPHAGOGASTRODUODENOSCOPY (EGD) WITH ESOPHAGEAL DILATION;  Surgeon: Corbin Ade, MD;  Location: [facility];  Service: Endoscopy;  Laterality: N/A;  9:30   OOPHORECTOMY  1976   ORIF ANKLE FRACTURE  01/18/2012   Procedure: OPEN REDUCTION INTERNAL FIXATION (ORIF) ANKLE FRACTURE;  Surgeon: Fuller Canada, MD;  Location: [facility];  Service: Orthopedics;  Laterality: Left;   ORIF ANKLE FRACTURE Right 01/13/2016   Procedure: OPEN REDUCTION INTERNAL FIXATION (ORIF) ANKLE FRACTURE;  Surgeon: Vickki Hearing, MD;  Location: [facility];  Service: Orthopedics;  Laterality: Right;    Social History   Socioeconomic History   Marital status: Widowed    Spouse name: Not on file   Number of children: 4  Years of education: college 1   Highest education level: Not on file  Occupational History   Occupation: employed in the tobacco industry     Employer: RETIRED   Occupation: retired  Tobacco Use   Smoking status: Former    Packs/day: 1.50    Years: 40.00    Additional pack years: 0.00    Total pack years: 60.00    Types: Cigarettes    Quit date: 06/21/2006    Years since quitting: 16.7   Smokeless tobacco: Never  Vaping Use   Vaping Use: Never used  Substance and Sexual Activity   Alcohol use: No   Drug use: No   Sexual activity: Not Currently  Other Topics Concern   Not on file  Social History Narrative   Long term resident of [facility]    Social Determinants of Health   Financial Resource  Strain: Low Risk  (12/02/2020)   Overall Financial Resource Strain (CARDIA)    Difficulty of Paying Living Expenses: Not hard at all  Food Insecurity: No Food Insecurity (12/02/2020)   Hunger Vital Sign    Worried About Running Out of Food in the Last Year: Never true    Ran Out of Food in the Last Year: Never true  Transportation Needs: No Transportation Needs (12/02/2020)   PRAPARE - Administrator, Civil Service (Medical): No    Lack of Transportation (Non-Medical): No  Physical Activity: Inactive (12/02/2020)   Exercise Vital Sign    Days of Exercise per Week: 0 days    Minutes of Exercise per Session: 0 min  Stress: No Stress Concern Present (12/02/2020)   [employer] of Occupational Health - Occupational Stress Questionnaire    Feeling of Stress : Not at all  Social Connections: Socially Isolated (12/02/2020)   Social Connection and Isolation Panel [NHANES]    Frequency of Communication with Friends and Family: Three times a week    Frequency of Social Gatherings with Friends and Family: Once a week    Attends Religious Services: Never    Database administrator or Organizations: No    Attends Banker Meetings: Never    Marital Status: Widowed  Intimate Partner Violence: Not At Risk (12/02/2020)   Humiliation, Afraid, Rape, and Kick questionnaire    Fear of Current or Ex-Partner: No    Emotionally Abused: No    Physically Abused: No    Sexually Abused: No   Family History  Problem Relation Age of Onset   Stomach cancer Father    Cancer Father    Cancer Mother        brain    Breast cancer Mother    Arthritis Other       VITAL SIGNS BP 107/67   Pulse 92   Ht 6\' 1"  (1.854 m)   Wt 161 lb 12.8 oz (73.4 kg)   BMI 21.35 kg/m   Outpatient Encounter Medications as of 03/24/2023  Medication Sig   calcium-vitamin D (OSCAL WITH D) 500-5 MG-MCG tablet Take 1 tablet by mouth daily at 12 noon.   cyanocobalamin (VITAMIN B12) 1000 MCG tablet Take  1,000 mcg by mouth daily.   donepezil (ARICEPT) 10 MG tablet TAKE 1 TABLET AT BEDTIME   loratadine (CLARITIN) 10 MG tablet Take 10 mg by mouth daily.   magnesium hydroxide (MILK OF MAGNESIA) 400 MG/5ML suspension Take 30 mLs by mouth daily as needed for mild constipation.   mirabegron ER (MYRBETRIQ) 50 MG TB24 tablet Take 50 mg  by mouth daily.   NON FORMULARY Diet: Regular diet with chopped meats and gravy to all meats.   omeprazole (PRILOSEC) 40 MG capsule TAKE 1 CAPSULE EVERY DAY   OXYGEN Inhale 2 L into the lungs continuous.   potassium chloride SA (KLOR-CON M) 20 MEQ tablet Take 20 mEq by mouth daily.   STRIVERDI RESPIMAT 2.5 MCG/ACT AERS INHALE 2 PUFFS ONE TIME DAILY AT THE SAME TIME   verapamil (CALAN-SR) 180 MG CR tablet Take 180 mg by mouth daily.   No facility-administered encounter medications on file as of 03/24/2023.     SIGNIFICANT DIAGNOSTIC EXAMS  LABS REVIEWED PREVIOUS  05-21-22: wbc 4.8; hgb 11.; hct 37.5; mcv 87.2 plt 229; glucose 74; bun 26; creat 1.05 ;k+ 3.8; na++ 140; ca 9.2; gfr 54  06-10-22: vitamin D 39.61 10-20-22: wbc 13.6; hgb 9.7; hct 32.0; mcv 86.3; plt 205; glucose 09; bun 25; creat 1.20; k+ 3.7; na++ 139; ca 8.5; gfr 45; chol 110; ldl 63; trig 59; hdl 35  10-28-22: chol 110; ldl 63; trig 59; hdl 35 02-03-23; wbc 3.4; hgb 9.3; hct 30.9; mcv 86.1 plt 200; glucose 72; bun 24; creat 0.81; k+ 3.8; na++ 142; ca 8.7; gfr >60    NO NEW LABS    Review of Systems  Constitutional:  Negative for malaise/fatigue.  Respiratory:  Negative for cough and shortness of breath.   Cardiovascular:  Negative for chest pain, palpitations and leg swelling.  Gastrointestinal:  Negative for abdominal pain, constipation and heartburn.  Musculoskeletal:  Negative for back pain, joint pain and myalgias.  Skin: Negative.   Neurological:  Negative for dizziness.  Psychiatric/Behavioral:  The patient is not nervous/anxious.     Physical Exam Constitutional:      General: She is not  in acute distress.    Appearance: She is well-developed. She is not diaphoretic.  Neck:     Thyroid: No thyromegaly.  Cardiovascular:     Rate and Rhythm: Normal rate and regular rhythm.     Pulses: Normal pulses.     Heart sounds: Normal heart sounds.  Pulmonary:     Effort: Pulmonary effort is normal. No respiratory distress.     Breath sounds: Normal breath sounds.  Abdominal:     General: Bowel sounds are normal. There is no distension.     Palpations: Abdomen is soft.     Tenderness: There is no abdominal tenderness.  Musculoskeletal:        General: Normal range of motion.     Cervical back: Neck supple.     Right lower leg: No edema.     Left lower leg: No edema.  Lymphadenopathy:     Cervical: No cervical adenopathy.  Skin:    General: Skin is warm and dry.  Neurological:     Mental Status: She is alert. Mental status is at baseline.  Psychiatric:        Mood and Affect: Mood normal.       ASSESSMENT/ PLAN:  TODAY  Major neurocognitive disorder Vascular dementia without behavioral disturbance Fall at nursing home initial encounter  Will continue current medications The benefits of myretriq out weigh the risks involved with this medication.    Synthia Innocent NP [facility]  call [phone]

## 2023-03-28 DIAGNOSIS — W19XXXA Unspecified fall, initial encounter: Secondary | ICD-10-CM | POA: Insufficient documentation

## 2023-04-14 DIAGNOSIS — F0150 Vascular dementia without behavioral disturbance: Secondary | ICD-10-CM | POA: Diagnosis not present

## 2023-04-14 DIAGNOSIS — R1312 Dysphagia, oropharyngeal phase: Secondary | ICD-10-CM | POA: Diagnosis not present

## 2023-04-15 DIAGNOSIS — F0150 Vascular dementia without behavioral disturbance: Secondary | ICD-10-CM | POA: Diagnosis not present

## 2023-04-15 DIAGNOSIS — R1312 Dysphagia, oropharyngeal phase: Secondary | ICD-10-CM | POA: Diagnosis not present

## 2023-04-16 DIAGNOSIS — F0150 Vascular dementia without behavioral disturbance: Secondary | ICD-10-CM | POA: Diagnosis not present

## 2023-04-16 DIAGNOSIS — R1312 Dysphagia, oropharyngeal phase: Secondary | ICD-10-CM | POA: Diagnosis not present

## 2023-04-18 DIAGNOSIS — R1312 Dysphagia, oropharyngeal phase: Secondary | ICD-10-CM | POA: Diagnosis not present

## 2023-04-18 DIAGNOSIS — F0150 Vascular dementia without behavioral disturbance: Secondary | ICD-10-CM | POA: Diagnosis not present

## 2023-04-19 DIAGNOSIS — F0150 Vascular dementia without behavioral disturbance: Secondary | ICD-10-CM | POA: Diagnosis not present

## 2023-04-19 DIAGNOSIS — R1312 Dysphagia, oropharyngeal phase: Secondary | ICD-10-CM | POA: Diagnosis not present

## 2023-04-20 DIAGNOSIS — F0150 Vascular dementia without behavioral disturbance: Secondary | ICD-10-CM | POA: Diagnosis not present

## 2023-04-20 DIAGNOSIS — R1312 Dysphagia, oropharyngeal phase: Secondary | ICD-10-CM | POA: Diagnosis not present

## 2023-04-21 DIAGNOSIS — F0150 Vascular dementia without behavioral disturbance: Secondary | ICD-10-CM | POA: Diagnosis not present

## 2023-04-21 DIAGNOSIS — R1312 Dysphagia, oropharyngeal phase: Secondary | ICD-10-CM | POA: Diagnosis not present

## 2023-04-26 ENCOUNTER — Encounter: Payer: Self-pay | Admitting: Internal Medicine

## 2023-04-26 ENCOUNTER — Non-Acute Institutional Stay (SKILLED_NURSING_FACILITY): Payer: Medicare PPO | Admitting: Internal Medicine

## 2023-04-26 DIAGNOSIS — J9611 Chronic respiratory failure with hypoxia: Secondary | ICD-10-CM | POA: Diagnosis not present

## 2023-04-26 DIAGNOSIS — D649 Anemia, unspecified: Secondary | ICD-10-CM

## 2023-04-26 DIAGNOSIS — N1831 Chronic kidney disease, stage 3a: Secondary | ICD-10-CM

## 2023-04-26 DIAGNOSIS — F0150 Vascular dementia without behavioral disturbance: Secondary | ICD-10-CM

## 2023-04-26 DIAGNOSIS — I10 Essential (primary) hypertension: Secondary | ICD-10-CM | POA: Diagnosis not present

## 2023-04-26 DIAGNOSIS — J9612 Chronic respiratory failure with hypercapnia: Secondary | ICD-10-CM | POA: Diagnosis not present

## 2023-04-26 NOTE — Assessment & Plan Note (Signed)
She will use other residents' bathrooms as she roams about the facility in her wheelchair.

## 2023-04-26 NOTE — Assessment & Plan Note (Signed)
O2 sats are good on present nasal O2 settings.  Physical findings are stable.  No change indicated in pulmonary toilet.

## 2023-04-26 NOTE — Assessment & Plan Note (Signed)
Current creatinine is 0.79 with a GFR greater than 60 indicating CKD stage II.  In December 2023 creatinine was 1.20 and GFR 45 indicating borderline CKD stage IIIa/b. No change in medications indicated.

## 2023-04-26 NOTE — Patient Instructions (Signed)
See assessment and plan under each diagnosis in the problem list and acutely for this visit 

## 2023-04-26 NOTE — Assessment & Plan Note (Signed)
Serially the minimally hypochromic anemia is stable to improved.  Staff reports no bleeding dyscrasias.  Continue to monitor.

## 2023-04-26 NOTE — Progress Notes (Unsigned)
NURSING HOME LOCATION:  [facility] ROOM NUMBER: 149 W  CODE STATUS: DNR  PCP:  Synthia Innocent NP  This is a nursing facility follow up visit of chronic medical diagnoses & to document compliance with Regulation 483.30 (c) in The Long Term Care Survey Manual Phase 2 which mandates caregiver visit ( visits can alternate among physician, PA or NP as per statutes) within 10 days of 30 days / 60 days/ 90 days post admission to SNF date    Interim medical record and care since last SNF visit was updated with review of diagnostic studies and change in clinical status since last visit were documented.  HPI: She is a permanent resident of this facility with medical diagnoses of oxygen dependent COPD, DDD, GERD, essential hypertension, history of colon polyps, OSA, and dementia. Most recent labs were performed 03/10/2023.  Protein/caloric malnutrition was present with an albumin of 3.4 total protein is 6.1.  Creatinine was 0.79 with a GFR greater than 60 indicating significant improvement in her renal function since December 23 when creatinine was 1.20 and GFR was 45 indicating borderline CKD stage IIIa/B.  Anemia is stable to slightly improved serially with current H/H of 9.9/32.7.  Indices are minimally hypochromic.  TSH was low therapeutic.    Review of systems: Dementia invalidated responses.  Answers to review of system questions was a monosyllable "no" except she stated she was "cold."  She denied any other symptoms; but then she did tell me "it is just nasty."  When asked her what she meant she confabulated "the close that you have."  Constitutional: No fever, significant weight change, fatigue  Eyes: No redness, discharge, pain, vision change ENT/mouth: No nasal congestion,  purulent discharge, earache, change in hearing, sore throat  Cardiovascular: No chest pain, palpitations, paroxysmal nocturnal dyspnea, claudication, edema  Respiratory: No cough, sputum production,  hemoptysis, DOE, significant snoring, apnea   Gastrointestinal: No heartburn, dysphagia, abdominal pain, nausea /vomiting, rectal bleeding, melena, change in bowels Genitourinary: No dysuria, hematuria, pyuria, incontinence, nocturia Musculoskeletal: No joint stiffness, joint swelling, weakness, pain Dermatologic: No rash, pruritus, change in appearance of skin Neurologic: No dizziness, headache, syncope, seizures, numbness, tingling Psychiatric: No significant anxiety, depression, insomnia, anorexia Endocrine: No change in hair/skin/nails, excessive thirst, excessive hunger, excessive urination  Hematologic/lymphatic: No significant bruising, lymphadenopathy, abnormal bleeding Allergy/immunology: No itchy/watery eyes, significant sneezing, urticaria, angioedema  Physical exam:  Pertinent or positive findings: She appears her age.  She has bilateral ptosis.  Facies are blank.  She is wearing nasal oxygen.  She has complete dentures.  First heart sound is accentuated.  Rate is slow but regular.  Breath sounds are decreased.  Pedal pulses are not palpable.  She has trace edema at the sock line.  Deep tendon reflexes 1.5+.  General appearance: Adequately nourished; no acute distress, increased work of breathing is present.   Lymphatic: No lymphadenopathy about the head, neck, axilla. Eyes: No conjunctival inflammation or lid edema is present. There is no scleral icterus. Ears:  External ear exam shows no significant lesions or deformities.   Nose:  External nasal examination shows no deformity or inflammation. Nasal mucosa are pink and moist without lesions, exudates Oral exam:  Lips and gums are healthy appearing. There is no oropharyngeal erythema or exudate. Neck:  No thyromegaly, masses, tenderness noted.    Heart:  Normal rate and regular rhythm. S1 and S2 normal without gallop, murmur, click, rub .  Lungs: Chest clear to auscultation without wheezes, rhonchi, rales,  rubs. Abdomen: Bowel  sounds are normal. Abdomen is soft and nontender with no organomegaly, hernias, masses. GU: Deferred  Extremities:  No cyanosis, clubbing, edema  Neurologic exam : Cn 2-7 intact:Balance, Rhomberg, finger to nose testing could not be completed due to clinical state Deep tendon reflexes are equal Skin: Warm & dry w/o tenting. No significant lesions or rash.  See summary under each active problem in the Problem List with associated updated therapeutic plan

## 2023-04-26 NOTE — Assessment & Plan Note (Signed)
BP controlled; no change in CCB

## 2023-04-29 ENCOUNTER — Encounter: Payer: Self-pay | Admitting: Adult Health

## 2023-04-29 ENCOUNTER — Non-Acute Institutional Stay (SKILLED_NURSING_FACILITY): Payer: Medicare PPO | Admitting: Adult Health

## 2023-04-29 DIAGNOSIS — F0390 Unspecified dementia without behavioral disturbance: Secondary | ICD-10-CM | POA: Diagnosis not present

## 2023-04-29 DIAGNOSIS — J9612 Chronic respiratory failure with hypercapnia: Secondary | ICD-10-CM

## 2023-04-29 DIAGNOSIS — F0150 Vascular dementia without behavioral disturbance: Secondary | ICD-10-CM | POA: Diagnosis not present

## 2023-04-29 DIAGNOSIS — J9611 Chronic respiratory failure with hypoxia: Secondary | ICD-10-CM

## 2023-04-29 NOTE — Progress Notes (Signed)
Location:  [facility] Room Number: 149 Place of Service:   SNF    CODE STATUS: dnr  Allergies  Allergen Reactions   Fenofibrate Nausea And Vomiting   Pravastatin Sodium Nausea And Vomiting   Sulfonamide Derivatives Other (See Comments)    unknown   Ciprofloxacin Rash    Chief Complaint  Patient presents with   Acute Visit    Care plan meeting     HPI:  We have come together for her care plan meeting.  BIMS no; mood 0/30. She is nonambulatory; uses wheelchair has had 2 falls without injury. She requires moderate assist with her adls. She is frequently incontinent of bladder and bowel. Dietary: weight is 161 pounds regular diet with chopped meats/gravy requires setup for meals; appetite 25-75%. Therapy: not at this time. Activities: passive attendance. She continues to be followed for her chronic illnesses including:Chronic respiratory failure with hypoxia and hypercapnia  Major neurocognitive disorder   Vascular dementia without behavioral disturbance   Past Medical History:  Diagnosis Date   Abnormality of gait 06/06/2014   Allergic rhinitis    Anemia    Ankle fracture, right    Anxiety    Arm fracture, left    Arthritis    abnormal gait    COPD (chronic obstructive pulmonary disease) (HCC)    chronic CO2 retention, decreased DLCO    Cystic acne    adult    Degenerative disc disease, cervical    syrinx C3-7   Degenerative disc disease, lumbar    L5 nerve impingement    Dementia (HCC)    Depression    DNR (do not resuscitate)    Fracture of right proximal fibula 07/13/18 09/05/2018   GERD (gastroesophageal reflux disease)    Heme positive stool 10/10/2017   History of colon surgery    right hemicolectomy for high-grade adenomas in right colon 2013   Hyperglycemia    Hypertension    Low back pain    Mediastinal lymphadenopathy 1/9   resolving    Memory difficulty 09/20/2014   mild dementia   Onychomycosis    OSA on CPAP    Sleep apnea     stop bang score 6    Past Surgical History:  Procedure Laterality Date   ABDOMINAL HYSTERECTOMY  1976   secondary to bleeding    APPENDECTOMY     COLON RESECTION  08/18/2012   Procedure: HAND ASSISTED LAPAROSCOPIC COLON RESECTION;  Surgeon: Dalia Heading, MD;  Location: [facility];  Service: General;  Laterality: N/A;   COLONOSCOPY  07/20/2012   Dr. Jena Gauss: multiple colonic polyps with large polyps on right side, s/p saline-assisted debulking piecemeal polypectomy and ablation. Not all removed. Path with tubulovillous adenomas, high grade dysplasia.    COLONOSCOPY N/A 03/09/2013   ZOX:[id number] polyps-tubular adenomas. S/p right hemicolectomy. next tcs 02/2018   epidural steroids     ESOPHAGOGASTRODUODENOSCOPY (EGD) WITH ESOPHAGEAL DILATION N/A 02/06/2014   Procedure: ESOPHAGOGASTRODUODENOSCOPY (EGD) WITH ESOPHAGEAL DILATION;  Surgeon: Corbin Ade, MD;  Location: [facility];  Service: Endoscopy;  Laterality: N/A;  9:30   OOPHORECTOMY  1976   ORIF ANKLE FRACTURE  01/18/2012   Procedure: OPEN REDUCTION INTERNAL FIXATION (ORIF) ANKLE FRACTURE;  Surgeon: Fuller Canada, MD;  Location: [facility];  Service: Orthopedics;  Laterality: Left;   ORIF ANKLE FRACTURE Right 01/13/2016   Procedure: OPEN REDUCTION INTERNAL FIXATION (ORIF) ANKLE FRACTURE;  Surgeon: Vickki Hearing, MD;  Location: [facility];  Service: Orthopedics;  Laterality:  Right;    Social History   Socioeconomic History   Marital status: Widowed    Spouse name: Not on file   Number of children: 4   Years of education: college 1   Highest education level: Not on file  Occupational History   Occupation: employed in the tobacco industry     Employer: RETIRED   Occupation: retired  Tobacco Use   Smoking status: Former    Packs/day: 1.50    Years: 40.00    Additional pack years: 0.00    Total pack years: 60.00    Types: Cigarettes    Quit date: 06/21/2006    Years since quitting: 16.8   Smokeless tobacco: Never  Vaping Use    Vaping Use: Never used  Substance and Sexual Activity   Alcohol use: No   Drug use: No   Sexual activity: Not Currently  Other Topics Concern   Not on file  Social History Narrative   Long term resident of [facility]    Social Determinants of Health   Financial Resource Strain: Low Risk  (12/02/2020)   Overall Financial Resource Strain (CARDIA)    Difficulty of Paying Living Expenses: Not hard at all  Food Insecurity: No Food Insecurity (12/02/2020)   Hunger Vital Sign    Worried About Running Out of Food in the Last Year: Never true    Ran Out of Food in the Last Year: Never true  Transportation Needs: No Transportation Needs (12/02/2020)   PRAPARE - Administrator, Civil Service (Medical): No    Lack of Transportation (Non-Medical): No  Physical Activity: Inactive (12/02/2020)   Exercise Vital Sign    Days of Exercise per Week: 0 days    Minutes of Exercise per Session: 0 min  Stress: No Stress Concern Present (12/02/2020)   [employer] of Occupational Health - Occupational Stress Questionnaire    Feeling of Stress : Not at all  Social Connections: Socially Isolated (12/02/2020)   Social Connection and Isolation Panel [NHANES]    Frequency of Communication with Friends and Family: Three times a week    Frequency of Social Gatherings with Friends and Family: Once a week    Attends Religious Services: Never    Database administrator or Organizations: No    Attends Banker Meetings: Never    Marital Status: Widowed  Intimate Partner Violence: Not At Risk (12/02/2020)   Humiliation, Afraid, Rape, and Kick questionnaire    Fear of Current or Ex-Partner: No    Emotionally Abused: No    Physically Abused: No    Sexually Abused: No   Family History  Problem Relation Age of Onset   Stomach cancer Father    Cancer Father    Cancer Mother        brain    Breast cancer Mother    Arthritis Other       VITAL SIGNS BP 136/60   Pulse 90   Temp 97.9 F  (36.6 C)   Resp 18   Ht 6\' 1"  (1.854 m)   Wt 161 lb (73 kg)   SpO2 95%   BMI 21.24 kg/m   Outpatient Encounter Medications as of 04/29/2023  Medication Sig   calcium-vitamin D (OSCAL WITH D) 500-5 MG-MCG tablet Take 1 tablet by mouth daily at 12 noon.   cyanocobalamin (VITAMIN B12) 1000 MCG tablet Take 1,000 mcg by mouth daily.   donepezil (ARICEPT) 10 MG tablet TAKE 1 TABLET AT BEDTIME  loratadine (CLARITIN) 10 MG tablet Take 10 mg by mouth daily.   magnesium hydroxide (MILK OF MAGNESIA) 400 MG/5ML suspension Take 30 mLs by mouth daily as needed for mild constipation.   mirabegron ER (MYRBETRIQ) 50 MG TB24 tablet Take 50 mg by mouth daily.   NON FORMULARY Diet: Regular diet with chopped meats and gravy to all meats.   omeprazole (PRILOSEC) 40 MG capsule TAKE 1 CAPSULE EVERY DAY   OXYGEN Inhale 2 L into the lungs continuous.   potassium chloride SA (KLOR-CON M) 20 MEQ tablet Take 20 mEq by mouth daily.   STRIVERDI RESPIMAT 2.5 MCG/ACT AERS INHALE 2 PUFFS ONE TIME DAILY AT THE SAME TIME   verapamil (CALAN-SR) 180 MG CR tablet Take 180 mg by mouth daily.   No facility-administered encounter medications on file as of 04/29/2023.     SIGNIFICANT DIAGNOSTIC EXAMS  LABS REVIEWED PREVIOUS  05-21-22: wbc 4.8; hgb 11.; hct 37.5; mcv 87.2 plt 229; glucose 74; bun 26; creat 1.05 ;k+ 3.8; na++ 140; ca 9.2; gfr 54  06-10-22: vitamin D 39.61 10-20-22: wbc 13.6; hgb 9.7; hct 32.0; mcv 86.3; plt 205; glucose 09; bun 25; creat 1.20; k+ 3.7; na++ 139; ca 8.5; gfr 45; chol 110; ldl 63; trig 59; hdl 35  10-28-22: chol 110; ldl 63; trig 59; hdl 35 02-03-23; wbc 3.4; hgb 9.3; hct 30.9; mcv 86.1 plt 200; glucose 72; bun 24; creat 0.81; k+ 3.8; na++ 142; ca 8.7; gfr >60    NO NEW LABS   Review of Systems  Constitutional:  Negative for malaise/fatigue.  Respiratory:  Negative for cough and shortness of breath.   Cardiovascular:  Negative for chest pain, palpitations and leg swelling.  Gastrointestinal:   Negative for abdominal pain, constipation and heartburn.  Musculoskeletal:  Negative for back pain, joint pain and myalgias.  Skin: Negative.   Neurological:  Negative for dizziness.  Psychiatric/Behavioral:  The patient is not nervous/anxious.     Physical Exam Constitutional:      General: She is not in acute distress.    Appearance: She is well-developed. She is not diaphoretic.  Neck:     Thyroid: No thyromegaly.  Cardiovascular:     Rate and Rhythm: Normal rate and regular rhythm.     Pulses: Normal pulses.     Heart sounds: Normal heart sounds.  Pulmonary:     Effort: Pulmonary effort is normal. No respiratory distress.     Breath sounds: Normal breath sounds.  Abdominal:     General: Bowel sounds are normal. There is no distension.     Palpations: Abdomen is soft.     Tenderness: There is no abdominal tenderness.  Musculoskeletal:        General: Normal range of motion.     Cervical back: Neck supple.     Right lower leg: No edema.     Left lower leg: No edema.  Lymphadenopathy:     Cervical: No cervical adenopathy.  Skin:    General: Skin is warm and dry.  Neurological:     Mental Status: She is alert. Mental status is at baseline.  Psychiatric:        Mood and Affect: Mood normal.     ASSESSMENT/ PLAN:  TODAY  Chronic respiratory failure with hypoxia and hypercapnia Major neurocognitive disorder  Vascular dementia without behavioral disturbance  Will continue current medications Will continue current plan of care Will continue to monitor her status.   Time spent with patient: 40 minutes: plan of care  medications dietary    Synthia Innocent NP [facility]   call [phone]

## 2023-05-03 ENCOUNTER — Encounter: Payer: Self-pay | Admitting: Adult Health

## 2023-05-03 ENCOUNTER — Emergency Department (HOSPITAL_COMMUNITY): Payer: Medicare PPO

## 2023-05-03 ENCOUNTER — Other Ambulatory Visit (HOSPITAL_COMMUNITY)
Admission: RE | Admit: 2023-05-03 | Discharge: 2023-05-03 | Disposition: A | Discharge status: Home / Self Care | Payer: Medicare PPO | Source: Skilled Nursing Facility | Attending: *Deleted | Admitting: *Deleted

## 2023-05-03 ENCOUNTER — Encounter (HOSPITAL_COMMUNITY): Payer: Self-pay | Admitting: Emergency Medicine

## 2023-05-03 ENCOUNTER — Non-Acute Institutional Stay (SKILLED_NURSING_FACILITY): Payer: Medicare PPO | Admitting: Adult Health

## 2023-05-03 ENCOUNTER — Other Ambulatory Visit: Payer: Self-pay

## 2023-05-03 ENCOUNTER — Emergency Department (HOSPITAL_COMMUNITY)
Admission: EM | Admit: 2023-05-03 | Discharge: 2023-05-03 | Disposition: A | Discharge status: Skilled Nursing Facility | Payer: Medicare PPO | Source: Home / Self Care | Attending: Emergency Medicine | Admitting: Emergency Medicine

## 2023-05-03 DIAGNOSIS — Z1152 Encounter for screening for COVID-19: Secondary | ICD-10-CM | POA: Diagnosis not present

## 2023-05-03 DIAGNOSIS — R509 Fever, unspecified: Secondary | ICD-10-CM | POA: Diagnosis not present

## 2023-05-03 DIAGNOSIS — R0989 Other specified symptoms and signs involving the circulatory and respiratory systems: Secondary | ICD-10-CM | POA: Diagnosis not present

## 2023-05-03 DIAGNOSIS — R0902 Hypoxemia: Secondary | ICD-10-CM | POA: Diagnosis present

## 2023-05-03 DIAGNOSIS — F0390 Unspecified dementia without behavioral disturbance: Secondary | ICD-10-CM | POA: Insufficient documentation

## 2023-05-03 DIAGNOSIS — Z90710 Acquired absence of both cervix and uterus: Secondary | ICD-10-CM | POA: Diagnosis not present

## 2023-05-03 DIAGNOSIS — Z800 Family history of malignant neoplasm of digestive organs: Secondary | ICD-10-CM | POA: Diagnosis not present

## 2023-05-03 DIAGNOSIS — J9612 Chronic respiratory failure with hypercapnia: Secondary | ICD-10-CM | POA: Diagnosis not present

## 2023-05-03 DIAGNOSIS — Z888 Allergy status to other drugs, medicaments and biological substances status: Secondary | ICD-10-CM | POA: Diagnosis not present

## 2023-05-03 DIAGNOSIS — R7881 Bacteremia: Secondary | ICD-10-CM | POA: Diagnosis not present

## 2023-05-03 DIAGNOSIS — J9611 Chronic respiratory failure with hypoxia: Secondary | ICD-10-CM | POA: Diagnosis not present

## 2023-05-03 DIAGNOSIS — I471 Supraventricular tachycardia, unspecified: Secondary | ICD-10-CM | POA: Diagnosis present

## 2023-05-03 DIAGNOSIS — T17908A Unspecified foreign body in respiratory tract, part unspecified causing other injury, initial encounter: Secondary | ICD-10-CM | POA: Diagnosis not present

## 2023-05-03 DIAGNOSIS — R0602 Shortness of breath: Secondary | ICD-10-CM | POA: Diagnosis not present

## 2023-05-03 DIAGNOSIS — F419 Anxiety disorder, unspecified: Secondary | ICD-10-CM | POA: Diagnosis present

## 2023-05-03 DIAGNOSIS — R000 Tachycardia, unspecified: Secondary | ICD-10-CM | POA: Diagnosis not present

## 2023-05-03 DIAGNOSIS — Z803 Family history of malignant neoplasm of breast: Secondary | ICD-10-CM | POA: Diagnosis not present

## 2023-05-03 DIAGNOSIS — F32A Depression, unspecified: Secondary | ICD-10-CM | POA: Diagnosis present

## 2023-05-03 DIAGNOSIS — Z66 Do not resuscitate: Secondary | ICD-10-CM | POA: Diagnosis present

## 2023-05-03 DIAGNOSIS — Z86010 Personal history of colonic polyps: Secondary | ICD-10-CM | POA: Diagnosis not present

## 2023-05-03 DIAGNOSIS — R069 Unspecified abnormalities of breathing: Secondary | ICD-10-CM | POA: Diagnosis not present

## 2023-05-03 DIAGNOSIS — J449 Chronic obstructive pulmonary disease, unspecified: Secondary | ICD-10-CM

## 2023-05-03 DIAGNOSIS — I10 Essential (primary) hypertension: Secondary | ICD-10-CM | POA: Diagnosis present

## 2023-05-03 DIAGNOSIS — Z87891 Personal history of nicotine dependence: Secondary | ICD-10-CM | POA: Diagnosis not present

## 2023-05-03 DIAGNOSIS — G4733 Obstructive sleep apnea (adult) (pediatric): Secondary | ICD-10-CM | POA: Diagnosis present

## 2023-05-03 DIAGNOSIS — R059 Cough, unspecified: Secondary | ICD-10-CM | POA: Diagnosis not present

## 2023-05-03 DIAGNOSIS — K219 Gastro-esophageal reflux disease without esophagitis: Secondary | ICD-10-CM | POA: Diagnosis present

## 2023-05-03 DIAGNOSIS — J069 Acute upper respiratory infection, unspecified: Secondary | ICD-10-CM | POA: Diagnosis present

## 2023-05-03 DIAGNOSIS — I517 Cardiomegaly: Secondary | ICD-10-CM | POA: Diagnosis not present

## 2023-05-03 DIAGNOSIS — R918 Other nonspecific abnormal finding of lung field: Secondary | ICD-10-CM | POA: Diagnosis not present

## 2023-05-03 DIAGNOSIS — R062 Wheezing: Secondary | ICD-10-CM | POA: Diagnosis not present

## 2023-05-03 DIAGNOSIS — F0150 Vascular dementia without behavioral disturbance: Secondary | ICD-10-CM | POA: Diagnosis not present

## 2023-05-03 DIAGNOSIS — J189 Pneumonia, unspecified organism: Secondary | ICD-10-CM | POA: Diagnosis not present

## 2023-05-03 DIAGNOSIS — F0153 Vascular dementia, unspecified severity, with mood disturbance: Secondary | ICD-10-CM | POA: Diagnosis present

## 2023-05-03 DIAGNOSIS — Z9049 Acquired absence of other specified parts of digestive tract: Secondary | ICD-10-CM | POA: Diagnosis not present

## 2023-05-03 DIAGNOSIS — Z79899 Other long term (current) drug therapy: Secondary | ICD-10-CM | POA: Diagnosis not present

## 2023-05-03 DIAGNOSIS — Z882 Allergy status to sulfonamides status: Secondary | ICD-10-CM | POA: Diagnosis not present

## 2023-05-03 LAB — URINALYSIS, ROUTINE W REFLEX MICROSCOPIC
Bilirubin Urine: NEGATIVE
Glucose, UA: NEGATIVE mg/dL
Ketones, ur: NEGATIVE mg/dL
Nitrite: NEGATIVE
Protein, ur: 30 mg/dL — AB
Specific Gravity, Urine: 1.019 (ref 1.005–1.030)
pH: 5 (ref 5.0–8.0)

## 2023-05-03 LAB — CBC WITH DIFFERENTIAL/PLATELET
Abs Immature Granulocytes: 0.04 10*3/uL (ref 0.00–0.07)
Basophils Absolute: 0 10*3/uL (ref 0.0–0.1)
Basophils Relative: 0 %
Eosinophils Absolute: 0 10*3/uL (ref 0.0–0.5)
Eosinophils Relative: 1 %
HCT: 31.7 % — ABNORMAL LOW (ref 36.0–46.0)
Hemoglobin: 9.5 g/dL — ABNORMAL LOW (ref 12.0–15.0)
Immature Granulocytes: 1 %
Lymphocytes Relative: 10 %
Lymphs Abs: 0.7 10*3/uL (ref 0.7–4.0)
MCH: 25.8 pg — ABNORMAL LOW (ref 26.0–34.0)
MCHC: 30 g/dL (ref 30.0–36.0)
MCV: 86.1 fL (ref 80.0–100.0)
Monocytes Absolute: 0.4 10*3/uL (ref 0.1–1.0)
Monocytes Relative: 6 %
Neutro Abs: 6 10*3/uL (ref 1.7–7.7)
Neutrophils Relative %: 82 %
Platelets: 240 10*3/uL (ref 150–400)
RBC: 3.68 MIL/uL — ABNORMAL LOW (ref 3.87–5.11)
RDW: 14.8 % (ref 11.5–15.5)
WBC: 7.3 10*3/uL (ref 4.0–10.5)
nRBC: 0 % (ref 0.0–0.2)

## 2023-05-03 LAB — COMPREHENSIVE METABOLIC PANEL
ALT: 13 U/L (ref 0–44)
AST: 14 U/L — ABNORMAL LOW (ref 15–41)
Albumin: 3.3 g/dL — ABNORMAL LOW (ref 3.5–5.0)
Alkaline Phosphatase: 63 U/L (ref 38–126)
Anion gap: 9 (ref 5–15)
BUN: 16 mg/dL (ref 8–23)
CO2: 28 mmol/L (ref 22–32)
Calcium: 8.9 mg/dL (ref 8.9–10.3)
Chloride: 102 mmol/L (ref 98–111)
Creatinine, Ser: 0.84 mg/dL (ref 0.44–1.00)
GFR, Estimated: >60 mL/min (ref >60)
Glucose, Bld: 101 mg/dL — ABNORMAL HIGH (ref 70–99)
Potassium: 3.7 mmol/L (ref 3.5–5.1)
Sodium: 139 mmol/L (ref 135–145)
Total Bilirubin: 0.7 mg/dL (ref 0.3–1.2)
Total Protein: 6.4 g/dL — ABNORMAL LOW (ref 6.5–8.1)

## 2023-05-03 LAB — BRAIN NATRIURETIC PEPTIDE: B Natriuretic Peptide: 51 pg/mL (ref 0.0–100.0)

## 2023-05-03 MED ORDER — ALBUTEROL SULFATE (2.5 MG/3ML) 0.083% IN NEBU
2.5000 mg | INHALATION_SOLUTION | Freq: Once | RESPIRATORY_TRACT | Status: AC
  Administered 2023-05-03: 2.5 mg via RESPIRATORY_TRACT
  Filled 2023-05-03: qty 3

## 2023-05-03 NOTE — Progress Notes (Signed)
Location:  [facility] Room Number: 149 Place of Service:  SNF (31)   CODE STATUS: dnr   Allergies  Allergen Reactions   Fenofibrate Nausea And Vomiting   Pravastatin Sodium Nausea And Vomiting   Sulfonamide Derivatives Other (See Comments)    unknown   Ciprofloxacin Rash    Chief Complaint  Patient presents with   Acute Visit    Cough with congestion    HPI:  Staff reports that this AM she has required suctioning. Her 02 sats are in low  80's to upper 70's.  with 02 in place. She is in respiratory distress; with tachypnea and tachycardia.  There are no reports of fevers present. Her skin is warm and dry. Her CBG reading is 127.   Past Medical History:  Diagnosis Date   Abnormality of gait 06/06/2014   Allergic rhinitis    Anemia    Ankle fracture, right    Anxiety    Arm fracture, left    Arthritis    abnormal gait    COPD (chronic obstructive pulmonary disease) (HCC)    chronic CO2 retention, decreased DLCO    Cystic acne    adult    Degenerative disc disease, cervical    syrinx C3-7   Degenerative disc disease, lumbar    L5 nerve impingement    Dementia (HCC)    Depression    DNR (do not resuscitate)    Fracture of right proximal fibula 07/13/18 09/05/2018   GERD (gastroesophageal reflux disease)    Heme positive stool 10/10/2017   History of colon surgery    right hemicolectomy for high-grade adenomas in right colon 2013   Hyperglycemia    Hypertension    Low back pain    Mediastinal lymphadenopathy 1/9   resolving    Memory difficulty 09/20/2014   mild dementia   Onychomycosis    OSA on CPAP    Sleep apnea    stop bang score 6    Past Surgical History:  Procedure Laterality Date   ABDOMINAL HYSTERECTOMY  1976   secondary to bleeding    APPENDECTOMY     COLON RESECTION  08/18/2012   Procedure: HAND ASSISTED LAPAROSCOPIC COLON RESECTION;  Surgeon: Dalia Heading, MD;  Location: [facility]S;  Service: General;  Laterality: N/A;    COLONOSCOPY  07/20/2012   Dr. Jena Gauss: multiple colonic polyps with large polyps on right side, s/p saline-assisted debulking piecemeal polypectomy and ablation. Not all removed. Path with tubulovillous adenomas, high grade dysplasia.    COLONOSCOPY N/A 03/09/2013   [id number] polyps-tubular adenomas. S/p right hemicolectomy. next tcs 02/2018   epidural steroids     ESOPHAGOGASTRODUODENOSCOPY (EGD) WITH ESOPHAGEAL DILATION N/A 02/06/2014   Procedure: ESOPHAGOGASTRODUODENOSCOPY (EGD) WITH ESOPHAGEAL DILATION;  Surgeon: Corbin Ade, MD;  Location: [facility] SUITE;  Service: Endoscopy;  Laterality: N/A;  9:30   OOPHORECTOMY  1976   ORIF ANKLE FRACTURE  01/18/2012   Procedure: OPEN REDUCTION INTERNAL FIXATION (ORIF) ANKLE FRACTURE;  Surgeon: Fuller Canada, MD;  Location: [facility];  Service: Orthopedics;  Laterality: Left;   ORIF ANKLE FRACTURE Right 01/13/2016   Procedure: OPEN REDUCTION INTERNAL FIXATION (ORIF) ANKLE FRACTURE;  Surgeon: Vickki Hearing, MD;  Location: [facility];  Service: Orthopedics;  Laterality: Right;    Social History   Socioeconomic History   Marital status: Widowed    Spouse name: Not on file   Number of children: 4   Years of education: college 1   Highest  education level: Not on file  Occupational History   Occupation: employed in the tobacco industry     Employer: RETIRED   Occupation: retired  Tobacco Use   Smoking status: Former    Packs/day: 1.50    Years: 40.00    Additional pack years: 0.00    Total pack years: 60.00    Types: Cigarettes    Quit date: 06/21/2006    Years since quitting: 16.8   Smokeless tobacco: Never  Vaping Use   Vaping Use: Never used  Substance and Sexual Activity   Alcohol use: No   Drug use: No   Sexual activity: Not Currently  Other Topics Concern   Not on file  Social History Narrative   Long term resident of [facility]    Social Determinants of Health   Financial Resource Strain: Low Risk  (12/02/2020)   Overall Financial  Resource Strain (CARDIA)    Difficulty of Paying Living Expenses: Not hard at all  Food Insecurity: No Food Insecurity (12/02/2020)   Hunger Vital Sign    Worried About Running Out of Food in the Last Year: Never true    Ran Out of Food in the Last Year: Never true  Transportation Needs: No Transportation Needs (12/02/2020)   PRAPARE - Administrator, Civil Service (Medical): No    Lack of Transportation (Non-Medical): No  Physical Activity: Inactive (12/02/2020)   Exercise Vital Sign    Days of Exercise per Week: 0 days    Minutes of Exercise per Session: 0 min  Stress: No Stress Concern Present (12/02/2020)   [employer] of Occupational Health - Occupational Stress Questionnaire    Feeling of Stress : Not at all  Social Connections: Socially Isolated (12/02/2020)   Social Connection and Isolation Panel [NHANES]    Frequency of Communication with Friends and Family: Three times a week    Frequency of Social Gatherings with Friends and Family: Once a week    Attends Religious Services: Never    Database administrator or Organizations: No    Attends Banker Meetings: Never    Marital Status: Widowed  Intimate Partner Violence: Not At Risk (12/02/2020)   Humiliation, Afraid, Rape, and Kick questionnaire    Fear of Current or Ex-Partner: No    Emotionally Abused: No    Physically Abused: No    Sexually Abused: No   Family History  Problem Relation Age of Onset   Stomach cancer Father    Cancer Father    Cancer Mother        brain    Breast cancer Mother    Arthritis Other       VITAL SIGNS BP 138/78   Pulse (!) 110   Temp 98 F (36.7 C)   Resp (!) 26   Ht 6\' 1"  (1.854 m)   Wt 161 lb (73 kg)   SpO2 (!) 80%   BMI 21.24 kg/m   Outpatient Encounter Medications as of 05/03/2023  Medication Sig   calcium-vitamin D (OSCAL WITH D) 500-5 MG-MCG tablet Take 1 tablet by mouth daily at 12 noon.   cyanocobalamin (VITAMIN B12) 1000 MCG tablet Take  1,000 mcg by mouth daily.   donepezil (ARICEPT) 10 MG tablet TAKE 1 TABLET AT BEDTIME   loratadine (CLARITIN) 10 MG tablet Take 10 mg by mouth daily.   magnesium hydroxide (MILK OF MAGNESIA) 400 MG/5ML suspension Take 30 mLs by mouth daily as needed for mild constipation.   mirabegron ER (  MYRBETRIQ) 50 MG TB24 tablet Take 50 mg by mouth daily.   NON FORMULARY Diet: Regular diet with chopped meats and gravy to all meats.   omeprazole (PRILOSEC) 40 MG capsule TAKE 1 CAPSULE EVERY DAY   OXYGEN Inhale 2 L into the lungs continuous.   potassium chloride SA (KLOR-CON M) 20 MEQ tablet Take 20 mEq by mouth daily.   STRIVERDI RESPIMAT 2.5 MCG/ACT AERS INHALE 2 PUFFS ONE TIME DAILY AT THE SAME TIME   verapamil (CALAN-SR) 180 MG CR tablet Take 180 mg by mouth daily.   No facility-administered encounter medications on file as of 05/03/2023.     SIGNIFICANT DIAGNOSTIC EXAMS  LABS REVIEWED PREVIOUS  05-21-22: wbc 4.8; hgb 11.; hct 37.5; mcv 87.2 plt 229; glucose 74; bun 26; creat 1.05 ;k+ 3.8; na++ 140; ca 9.2; gfr 54  06-10-22: vitamin D 39.61 10-20-22: wbc 13.6; hgb 9.7; hct 32.0; mcv 86.3; plt 205; glucose 09; bun 25; creat 1.20; k+ 3.7; na++ 139; ca 8.5; gfr 45; chol 110; ldl 63; trig 59; hdl 35  10-28-22: chol 110; ldl 63; trig 59; hdl 35 02-03-23; wbc 3.4; hgb 9.3; hct 30.9; mcv 86.1 plt 200; glucose 72; bun 24; creat 0.81; k+ 3.8; na++ 142; ca 8.7; gfr >60    NO NEW LABS   Review of Systems  Unable to perform ROS: Medical condition   Physical Exam Constitutional:      General: She is not in acute distress.    Appearance: She is well-developed. She is not diaphoretic.  Neck:     Thyroid: No thyromegaly.  Cardiovascular:     Rate and Rhythm: Tachycardia present. Rhythm irregular.     Pulses: Normal pulses.     Heart sounds: Normal heart sounds.  Pulmonary:     Effort: Respiratory distress present.     Breath sounds: Rhonchi and rales present.  Abdominal:     General: Bowel sounds are  normal. There is no distension.     Palpations: Abdomen is soft.     Tenderness: There is no abdominal tenderness.  Musculoskeletal:        General: Normal range of motion.     Cervical back: Neck supple.     Right lower leg: No edema.     Left lower leg: No edema.  Lymphadenopathy:     Cervical: No cervical adenopathy.  Skin:    General: Skin is warm and dry.  Neurological:     Mental Status: She is alert. Mental status is at baseline.  Psychiatric:        Mood and Affect: Mood normal.       ASSESSMENT/ PLAN:  TODAY  Chronic respiratory failure with hypoxia and hypercapnia COPD with hypoxia   Will send her to the ED for further evaluation and treatment options.   Synthia Innocent NP [facility]   call [phone]

## 2023-05-03 NOTE — ED Triage Notes (Signed)
Pt arrived via [facility] c/o sob and breathing difficulty that started this morning. Hx of COPD and on 2 L [place] at baseline. Per EMS, facility states SpO2 was 58% on 2 L this morning. She was then bumped up to 5 L and SpO2 was in the 80s. EMS placed on nonrebreather and gave breathing Tx and SpO2 came up to 97%

## 2023-05-03 NOTE — Discharge Instructions (Addendum)
Today's evaluation has not demonstrated pneumonia or other acute changes, but if you develop new, or concerning changes do not hesitate to return here.

## 2023-05-03 NOTE — ED Provider Notes (Signed)
[facility] EMERGENCY DEPARTMENT AT [facility] Provider Note   CSN: [id number] Arrival date & time: 05/03/23  4098     History  Chief Complaint  Patient presents with   Shortness of Breath    [name] is a 82 y.o. female.  HPI Patient presents with her daughter who provides the history. Patient is a nursing home resident due to substantial dementia.  She was well until today, when the patient's daughter noticed the patient had audible gurgling/wheezing, was found to be hypoxic in spite of using her typical home oxygen.  Patient received suctioning, bronchodilator, has improved.  Patient's dementia is prohibitive with history of telling.  Level 5 caveat. She does, however, denies any current complaints.    Home Medications Prior to Admission medications   Medication Sig Start Date End Date Taking? Authorizing Provider  calcium-vitamin D (OSCAL WITH D) 500-5 MG-MCG tablet Take 1 tablet by mouth daily at 12 noon.    [provider]  cyanocobalamin (VITAMIN B12) 1000 MCG tablet Take 1,000 mcg by mouth daily.    [provider]  donepezil (ARICEPT) 10 MG tablet TAKE 1 TABLET AT BEDTIME 10/16/20   Kerri Perches, MD  loratadine (CLARITIN) 10 MG tablet Take 10 mg by mouth daily.    [provider]  magnesium hydroxide (MILK OF MAGNESIA) 400 MG/5ML suspension Take 30 mLs by mouth daily as needed for mild constipation.    [provider]  mirabegron ER (MYRBETRIQ) 50 MG TB24 tablet Take 50 mg by mouth daily.    [provider]  NON FORMULARY Diet: Regular diet with chopped meats and gravy to all meats.    [provider]  omeprazole (PRILOSEC) 40 MG capsule TAKE 1 CAPSULE EVERY DAY 02/19/21   Freddy Finner, NP  OXYGEN Inhale 2 L into the lungs continuous.    [provider]  potassium chloride SA (KLOR-CON M) 20 MEQ tablet Take 20 mEq by mouth daily. 04/07/22   [provider]  STRIVERDI  RESPIMAT 2.5 MCG/ACT AERS INHALE 2 PUFFS ONE TIME DAILY AT THE SAME TIME 10/02/20   Kerri Perches, MD  verapamil (CALAN-SR) 180 MG CR tablet Take 180 mg by mouth daily.    [provider]      Allergies    Fenofibrate, Pravastatin sodium, Sulfonamide derivatives, and Ciprofloxacin    Review of Systems   Review of Systems  Unable to perform ROS: Dementia    Physical Exam Updated Vital Signs BP 109/60   Pulse 89   Temp 97.9 F (36.6 C) (Oral)   Resp 15   Ht 6\' 1"  (1.854 m)   SpO2 100%   BMI 21.24 kg/m  Physical Exam Vitals and nursing note reviewed.  Constitutional:      General: She is not in acute distress.    Appearance: She is well-developed.  HENT:     Head: Normocephalic and atraumatic.  Eyes:     Conjunctiva/sclera: Conjunctivae normal.  Cardiovascular:     Rate and Rhythm: Normal rate and regular rhythm.  Pulmonary:     Effort: Pulmonary effort is normal. No respiratory distress.     Breath sounds: No stridor. Wheezing present.  Abdominal:     General: There is no distension.  Skin:    General: Skin is warm and dry.  Neurological:     Mental Status: She is alert.     Cranial Nerves: No cranial nerve deficit.     Motor: Atrophy present.  Psychiatric:        Mood and Affect: Mood normal.        Cognition and Memory: Cognition is impaired. Memory is impaired.     ED Results / Procedures / Treatments   Labs (all labs ordered are listed, but only abnormal results are displayed) Labs Reviewed  COMPREHENSIVE METABOLIC PANEL - Abnormal; Notable for the following components:      Result Value   Glucose, Bld 101 (*)    Total Protein 6.4 (*)    Albumin 3.3 (*)    AST 14 (*)    All other components within normal limits  CBC WITH DIFFERENTIAL/PLATELET - Abnormal; Notable for the following components:   RBC 3.68 (*)    Hemoglobin 9.5 (*)    HCT 31.7 (*)    MCH 25.8 (*)    All other components within normal limits  BRAIN NATRIURETIC PEPTIDE     EKG EKG Interpretation  Date/Time:  Tuesday May 03 2023 09:23:11 EDT Ventricular Rate:  99 PR Interval:  71 QRS Duration: 92 QT Interval:  397 QTC Calculation: 510 R Axis:   20 Text Interpretation: Sinus tachycardia Atrial premature complexes Anterior infarct, old Nonspecific repol abnormality, diffuse leads Prolonged QT interval Confirmed by Gerhard Munch [phone]) on 05/03/2023 10:08:30 AM  Radiology DG Chest Port 1 View  Result Date: 05/03/2023 CLINICAL DATA:  Shortness of breath. EXAM: PORTABLE CHEST 1 VIEW COMPARISON:  01/06/2022 FINDINGS: Hyperinflation. Mild linear opacity seen at the right lung base likely scar or atelectasis. No consolidation, pneumothorax, effusion or edema. Normal cardiopericardial silhouette. Calcified aorta. Multifocal degenerative changes along the spine. IMPRESSION: Hyperinflation.  Mild right basilar scar or atelectasis. Electronically Signed   By: Karen Kays M.D.   On: 05/03/2023 11:11    Procedures Procedures    Medications Ordered in ED Medications  albuterol ([name]) (2.5 MG/3ML) 0.083% nebulizer solution 2.5 mg (2.5 mg Nebulization Given 05/03/23 1131)    ED Course/ Medical Decision Making/ A&P                             Medical Decision Making Elderly female with dementia presents from nursing facility with concern for wheezing, hypoxia, in the context of likely congestion. Patient denies complaints, though her history is limited secondary to her dementia. Differential includes viral process, pneumonia, COPD, less likely heart failure without appreciable new edema.  Labs x-ray bronchodilator ordered.  Amount and/or Complexity of Data Reviewed Independent Historian: caregiver External Data Reviewed: notes. Labs: ordered. Decision-making details documented in ED Course. Radiology: ordered and independent interpretation performed. Decision-making details documented in ED Course. ECG/medicine tests: ordered and independent  interpretation performed. Decision-making details documented in ED Course.  Risk Prescription drug management. Decision regarding hospitalization. Diagnosis or treatment significantly limited by social determinants of health.  Pulse ox 100% 2 L normal Cardiac 95 sinus normal  12:22 PM Patient awake, alert, in no distress, sitting upright, breathing without difficulty.  I discussed findings with the patient's daughter, no evidence for pneumonia, she is on home oxygen level of supplementation, no evidence for bacteremia, sepsis.  We discussed increased breathing treatment regimen for the next 2 days, patient will return to her nursing facility.        Final Clinical Impression(s) / ED Diagnoses Final diagnoses:  SOB (shortness of breath)    Rx / DC Orders ED Discharge Orders     None         Gerhard Munch, MD  05/03/23 1222  

## 2023-05-04 ENCOUNTER — Non-Acute Institutional Stay (SKILLED_NURSING_FACILITY): Payer: Medicare PPO | Admitting: Adult Health

## 2023-05-04 ENCOUNTER — Inpatient Hospital Stay (HOSPITAL_COMMUNITY)
Admission: EM | Admit: 2023-05-04 | Discharge: 2023-05-06 | DRG: 153 | Disposition: A | Discharge status: Other Acute Inpatient Hospital | Payer: Medicare PPO | Source: Skilled Nursing Facility | Attending: Family Medicine | Admitting: Family Medicine

## 2023-05-04 ENCOUNTER — Emergency Department (HOSPITAL_COMMUNITY): Payer: Medicare PPO

## 2023-05-04 ENCOUNTER — Encounter: Payer: Self-pay | Admitting: Adult Health

## 2023-05-04 ENCOUNTER — Telehealth: Payer: Self-pay | Admitting: Internal Medicine

## 2023-05-04 DIAGNOSIS — Z803 Family history of malignant neoplasm of breast: Secondary | ICD-10-CM

## 2023-05-04 DIAGNOSIS — J449 Chronic obstructive pulmonary disease, unspecified: Secondary | ICD-10-CM

## 2023-05-04 DIAGNOSIS — Z79899 Other long term (current) drug therapy: Secondary | ICD-10-CM

## 2023-05-04 DIAGNOSIS — Z87891 Personal history of nicotine dependence: Secondary | ICD-10-CM

## 2023-05-04 DIAGNOSIS — Z1152 Encounter for screening for COVID-19: Secondary | ICD-10-CM

## 2023-05-04 DIAGNOSIS — Z888 Allergy status to other drugs, medicaments and biological substances status: Secondary | ICD-10-CM

## 2023-05-04 DIAGNOSIS — F0153 Vascular dementia, unspecified severity, with mood disturbance: Secondary | ICD-10-CM | POA: Diagnosis present

## 2023-05-04 DIAGNOSIS — J9612 Chronic respiratory failure with hypercapnia: Secondary | ICD-10-CM

## 2023-05-04 DIAGNOSIS — Z86010 Personal history of colonic polyps: Secondary | ICD-10-CM

## 2023-05-04 DIAGNOSIS — R7881 Bacteremia: Secondary | ICD-10-CM | POA: Diagnosis present

## 2023-05-04 DIAGNOSIS — J9611 Chronic respiratory failure with hypoxia: Secondary | ICD-10-CM

## 2023-05-04 DIAGNOSIS — I10 Essential (primary) hypertension: Secondary | ICD-10-CM | POA: Diagnosis present

## 2023-05-04 DIAGNOSIS — I471 Supraventricular tachycardia, unspecified: Secondary | ICD-10-CM | POA: Diagnosis present

## 2023-05-04 DIAGNOSIS — Z882 Allergy status to sulfonamides status: Secondary | ICD-10-CM

## 2023-05-04 DIAGNOSIS — F32A Depression, unspecified: Secondary | ICD-10-CM | POA: Diagnosis present

## 2023-05-04 DIAGNOSIS — J069 Acute upper respiratory infection, unspecified: Principal | ICD-10-CM | POA: Diagnosis present

## 2023-05-04 DIAGNOSIS — F0150 Vascular dementia without behavioral disturbance: Secondary | ICD-10-CM | POA: Diagnosis present

## 2023-05-04 DIAGNOSIS — Z66 Do not resuscitate: Secondary | ICD-10-CM | POA: Diagnosis present

## 2023-05-04 DIAGNOSIS — Z9049 Acquired absence of other specified parts of digestive tract: Secondary | ICD-10-CM

## 2023-05-04 DIAGNOSIS — F419 Anxiety disorder, unspecified: Secondary | ICD-10-CM | POA: Diagnosis present

## 2023-05-04 DIAGNOSIS — R0902 Hypoxemia: Secondary | ICD-10-CM | POA: Diagnosis present

## 2023-05-04 DIAGNOSIS — K219 Gastro-esophageal reflux disease without esophagitis: Secondary | ICD-10-CM | POA: Diagnosis present

## 2023-05-04 DIAGNOSIS — Z90710 Acquired absence of both cervix and uterus: Secondary | ICD-10-CM

## 2023-05-04 DIAGNOSIS — T17908A Unspecified foreign body in respiratory tract, part unspecified causing other injury, initial encounter: Secondary | ICD-10-CM | POA: Insufficient documentation

## 2023-05-04 DIAGNOSIS — Z800 Family history of malignant neoplasm of digestive organs: Secondary | ICD-10-CM

## 2023-05-04 DIAGNOSIS — G4733 Obstructive sleep apnea (adult) (pediatric): Secondary | ICD-10-CM | POA: Diagnosis present

## 2023-05-04 DIAGNOSIS — J189 Pneumonia, unspecified organism: Principal | ICD-10-CM

## 2023-05-04 LAB — URINE CULTURE: Culture: NO GROWTH

## 2023-05-04 LAB — CULTURE, BLOOD (ROUTINE X 2): Special Requests: ADEQUATE

## 2023-05-04 MED ORDER — SODIUM CHLORIDE 0.9 % IV SOLN
2.0000 g | Freq: Once | INTRAVENOUS | Status: AC
  Administered 2023-05-05: 2 g via INTRAVENOUS
  Filled 2023-05-04: qty 12.5

## 2023-05-04 MED ORDER — METRONIDAZOLE 500 MG/100ML IV SOLN
500.0000 mg | Freq: Once | INTRAVENOUS | Status: AC
  Administered 2023-05-05: 500 mg via INTRAVENOUS
  Filled 2023-05-04: qty 100

## 2023-05-04 MED ORDER — VANCOMYCIN HCL 1500 MG/300ML IV SOLN
1500.0000 mg | Freq: Once | INTRAVENOUS | Status: AC
  Administered 2023-05-05: 1500 mg via INTRAVENOUS
  Filled 2023-05-04: qty 300

## 2023-05-04 MED ORDER — VANCOMYCIN HCL IN DEXTROSE 1-5 GM/200ML-% IV SOLN: 1000.0000 mg | Freq: Once | INTRAVENOUS | Status: DC

## 2023-05-04 MED ORDER — LACTATED RINGERS IV SOLN: INTRAVENOUS | Status: AC

## 2023-05-04 NOTE — Telephone Encounter (Signed)
Patient with Temp of 102 yesterday Had blood cultures done which came back positive for Gram positive Cocci. Will send Patient back to hospital for eval for source of positive culture

## 2023-05-04 NOTE — ED Triage Notes (Signed)
PT from [facility], brought over by staff. They received a call that her blood cultures came back positive. Pt is alert to self. Wheelchair bound but can stand & pivot. Wears 2L chronically. No complaints from patient at this time.

## 2023-05-04 NOTE — Progress Notes (Signed)
Location:  [facility] Room Number: 149W Place of Service:  SNF (31)   CODE STATUS: DNR  Allergies  Allergen Reactions   Fenofibrate Nausea And Vomiting   Pravastatin Sodium Nausea And Vomiting   Sulfonamide Derivatives Other (See Comments)    unknown   Ciprofloxacin Rash    Chief Complaint  Patient presents with   Acute Visit    ED follow up    HPI:  She had a hypoxic episode yesterday with sats ranging from 40's through 80's. She did go to the ED for further work up. She was given an albuterol neb treatment and has returned. Today she tells me that she is feeling better. She denies any cough or shortness of breath present. She denies wheezing. There are no reports of fevers present.   Past Medical History:  Diagnosis Date   Abnormality of gait 06/06/2014   Allergic rhinitis    Anemia    Ankle fracture, right    Anxiety    Arm fracture, left    Arthritis    abnormal gait    COPD (chronic obstructive pulmonary disease) (HCC)    chronic CO2 retention, decreased DLCO    Cystic acne    adult    Degenerative disc disease, cervical    syrinx C3-7   Degenerative disc disease, lumbar    L5 nerve impingement    Dementia (HCC)    Depression    DNR (do not resuscitate)    Fracture of right proximal fibula 07/13/18 09/05/2018   GERD (gastroesophageal reflux disease)    Heme positive stool 10/10/2017   History of colon surgery    right hemicolectomy for high-grade adenomas in right colon 2013   Hyperglycemia    Hypertension    Low back pain    Mediastinal lymphadenopathy 1/9   resolving    Memory difficulty 09/20/2014   mild dementia   Onychomycosis    OSA on CPAP    Sleep apnea    stop bang score 6    Past Surgical History:  Procedure Laterality Date   ABDOMINAL HYSTERECTOMY  1976   secondary to bleeding    APPENDECTOMY     COLON RESECTION  08/18/2012   Procedure: HAND ASSISTED LAPAROSCOPIC COLON RESECTION;  Surgeon: Dalia Heading, MD;   Location: [facility];  Service: General;  Laterality: N/A;   COLONOSCOPY  07/20/2012   Dr. Jena Gauss: multiple colonic polyps with large polyps on right side, s/p saline-assisted debulking piecemeal polypectomy and ablation. Not all removed. Path with tubulovillous adenomas, high grade dysplasia.    COLONOSCOPY N/A 03/09/2013   GMW:[id number] polyps-tubular adenomas. S/p right hemicolectomy. next tcs 02/2018   epidural steroids     ESOPHAGOGASTRODUODENOSCOPY (EGD) WITH ESOPHAGEAL DILATION N/A 02/06/2014   Procedure: ESOPHAGOGASTRODUODENOSCOPY (EGD) WITH ESOPHAGEAL DILATION;  Surgeon: Corbin Ade, MD;  Location: [facility];  Service: Endoscopy;  Laterality: N/A;  9:30   OOPHORECTOMY  1976   ORIF ANKLE FRACTURE  01/18/2012   Procedure: OPEN REDUCTION INTERNAL FIXATION (ORIF) ANKLE FRACTURE;  Surgeon: Fuller Canada, MD;  Location: [facility];  Service: Orthopedics;  Laterality: Left;   ORIF ANKLE FRACTURE Right 01/13/2016   Procedure: OPEN REDUCTION INTERNAL FIXATION (ORIF) ANKLE FRACTURE;  Surgeon: Vickki Hearing, MD;  Location: [facility];  Service: Orthopedics;  Laterality: Right;    Social History   Socioeconomic History   Marital status: Widowed    Spouse name: Not on file   Number of children: 4   Years of  education: college 1   Highest education level: Not on file  Occupational History   Occupation: employed in the tobacco industry     Employer: RETIRED   Occupation: retired  Tobacco Use   Smoking status: Former    Packs/day: 1.50    Years: 40.00    Additional pack years: 0.00    Total pack years: 60.00    Types: Cigarettes    Quit date: 06/21/2006    Years since quitting: 16.8   Smokeless tobacco: Never  Vaping Use   Vaping Use: Never used  Substance and Sexual Activity   Alcohol use: No   Drug use: No   Sexual activity: Not Currently  Other Topics Concern   Not on file  Social History Narrative   Long term resident of [facility]    Social Determinants of Health   Financial Resource  Strain: Low Risk  (12/02/2020)   Overall Financial Resource Strain (CARDIA)    Difficulty of Paying Living Expenses: Not hard at all  Food Insecurity: No Food Insecurity (12/02/2020)   Hunger Vital Sign    Worried About Running Out of Food in the Last Year: Never true    Ran Out of Food in the Last Year: Never true  Transportation Needs: No Transportation Needs (12/02/2020)   PRAPARE - Administrator, Civil Service (Medical): No    Lack of Transportation (Non-Medical): No  Physical Activity: Inactive (12/02/2020)   Exercise Vital Sign    Days of Exercise per Week: 0 days    Minutes of Exercise per Session: 0 min  Stress: No Stress Concern Present (12/02/2020)   [employer] of Occupational Health - Occupational Stress Questionnaire    Feeling of Stress : Not at all  Social Connections: Socially Isolated (12/02/2020)   Social Connection and Isolation Panel [NHANES]    Frequency of Communication with Friends and Family: Three times a week    Frequency of Social Gatherings with Friends and Family: Once a week    Attends Religious Services: Never    Database administrator or Organizations: No    Attends Banker Meetings: Never    Marital Status: Widowed  Intimate Partner Violence: Not At Risk (12/02/2020)   Humiliation, Afraid, Rape, and Kick questionnaire    Fear of Current or Ex-Partner: No    Emotionally Abused: No    Physically Abused: No    Sexually Abused: No   Family History  Problem Relation Age of Onset   Stomach cancer Father    Cancer Father    Cancer Mother        brain    Breast cancer Mother    Arthritis Other       VITAL SIGNS BP (!) 106/58   Pulse 74   Temp 99.2 F (37.3 C)   Resp 20   Ht 6\' 1"  (1.854 m)   Wt 161 lb (73 kg)   SpO2 97%   BMI 21.24 kg/m   Outpatient Encounter Medications as of 05/04/2023  Medication Sig   calcium-vitamin D (OSCAL WITH D) 500-5 MG-MCG tablet Take 1 tablet by mouth daily at 12 noon.    cyanocobalamin (VITAMIN B12) 1000 MCG tablet Take 1,000 mcg by mouth daily.   donepezil (ARICEPT) 10 MG tablet TAKE 1 TABLET AT BEDTIME   loratadine (CLARITIN) 10 MG tablet Take 10 mg by mouth daily.   magnesium hydroxide (MILK OF MAGNESIA) 400 MG/5ML suspension Take 30 mLs by mouth daily as needed for mild constipation.  mirabegron ER (MYRBETRIQ) 50 MG TB24 tablet Take 50 mg by mouth daily.   NON FORMULARY Diet: Regular diet with chopped meats and gravy to all meats.   omeprazole (PRILOSEC) 40 MG capsule TAKE 1 CAPSULE EVERY DAY   OXYGEN Inhale 2 L into the lungs continuous.   potassium chloride SA (KLOR-CON M) 20 MEQ tablet Take 20 mEq by mouth daily.   STRIVERDI RESPIMAT 2.5 MCG/ACT AERS INHALE 2 PUFFS ONE TIME DAILY AT THE SAME TIME   verapamil (CALAN-SR) 180 MG CR tablet Take 180 mg by mouth daily.   No facility-administered encounter medications on file as of 05/04/2023.     SIGNIFICANT DIAGNOSTIC EXAMS  LABS REVIEWED PREVIOUS  05-21-22: wbc 4.8; hgb 11.; hct 37.5; mcv 87.2 plt 229; glucose 74; bun 26; creat 1.05 ;k+ 3.8; na++ 140; ca 9.2; gfr 54  06-10-22: vitamin D 39.61 10-20-22: wbc 13.6; hgb 9.7; hct 32.0; mcv 86.3; plt 205; glucose 09; bun 25; creat 1.20; k+ 3.7; na++ 139; ca 8.5; gfr 45; chol 110; ldl 63; trig 59; hdl 35  10-28-22: chol 110; ldl 63; trig 59; hdl 35 02-03-23; wbc 3.4; hgb 9.3; hct 30.9; mcv 86.1 plt 200; glucose 72; bun 24; creat 0.81; k+ 3.8; na++ 142; ca 8.7; gfr >60    TODAY  03-10-23: wbc 4.4; hgb 9.9; hct 32.7; mcv 85.4 plt 210; glucose 80; bun 22; creat 0.79 k+ 4.0; na++ 139; ca 9.1; gfr >60; protein 6.1 albumin 3.4; tsh 0.918; vitamin B 12: 250; vitamin D 42.92;  05-03-23: wbc 7.3; hgb 9.5; hct 31.7; mcv 86.1 plt 240; glucose 101; bun 16; creat 0.84; k+ 3.7; na++ 139; ca 8.9; gfr >60; protein 6.4 albumin 3.3 BNP 51   Review of Systems  Constitutional:  Negative for malaise/fatigue.  Respiratory:  Negative for cough and shortness of breath.    Cardiovascular:  Negative for chest pain, palpitations and leg swelling.  Gastrointestinal:  Negative for abdominal pain, constipation and heartburn.  Musculoskeletal:  Negative for back pain, joint pain and myalgias.  Skin: Negative.   Neurological:  Negative for dizziness.  Psychiatric/Behavioral:  The patient is not nervous/anxious.     Physical Exam Constitutional:      General: She is not in acute distress.    Appearance: She is well-developed. She is not diaphoretic.  Neck:     Thyroid: No thyromegaly.  Cardiovascular:     Rate and Rhythm: Normal rate and regular rhythm.     Pulses: Normal pulses.     Heart sounds: Normal heart sounds.  Pulmonary:     Effort: Pulmonary effort is normal. No respiratory distress.     Breath sounds: Wheezing present.     Comments: Scattered  Abdominal:     General: Bowel sounds are normal. There is no distension.     Palpations: Abdomen is soft.     Tenderness: There is no abdominal tenderness.  Musculoskeletal:        General: Normal range of motion.     Cervical back: Neck supple.     Right lower leg: No edema.     Left lower leg: No edema.  Lymphadenopathy:     Cervical: No cervical adenopathy.  Skin:    General: Skin is warm and dry.  Neurological:     Mental Status: She is alert. Mental status is at baseline.  Psychiatric:        Mood and Affect: Mood normal.      ASSESSMENT/ PLAN:  TODAY  Chronic respiratory failure  with hypoxia and hypercapnia COPD with hypoxia  Will not make changes at this time with her current regimen and will monitor her status.     Synthia Innocent NP [facility]  call [phone]

## 2023-05-05 ENCOUNTER — Encounter (HOSPITAL_COMMUNITY): Payer: Self-pay | Admitting: Family Medicine

## 2023-05-05 ENCOUNTER — Other Ambulatory Visit: Payer: Self-pay

## 2023-05-05 DIAGNOSIS — I471 Supraventricular tachycardia, unspecified: Secondary | ICD-10-CM | POA: Diagnosis present

## 2023-05-05 DIAGNOSIS — Z800 Family history of malignant neoplasm of digestive organs: Secondary | ICD-10-CM | POA: Diagnosis not present

## 2023-05-05 DIAGNOSIS — Z882 Allergy status to sulfonamides status: Secondary | ICD-10-CM | POA: Diagnosis not present

## 2023-05-05 DIAGNOSIS — J449 Chronic obstructive pulmonary disease, unspecified: Secondary | ICD-10-CM

## 2023-05-05 DIAGNOSIS — F32A Depression, unspecified: Secondary | ICD-10-CM | POA: Diagnosis present

## 2023-05-05 DIAGNOSIS — I10 Essential (primary) hypertension: Secondary | ICD-10-CM | POA: Diagnosis present

## 2023-05-05 DIAGNOSIS — T17908A Unspecified foreign body in respiratory tract, part unspecified causing other injury, initial encounter: Secondary | ICD-10-CM | POA: Diagnosis not present

## 2023-05-05 DIAGNOSIS — F0150 Vascular dementia without behavioral disturbance: Secondary | ICD-10-CM | POA: Diagnosis not present

## 2023-05-05 DIAGNOSIS — J069 Acute upper respiratory infection, unspecified: Secondary | ICD-10-CM | POA: Diagnosis present

## 2023-05-05 DIAGNOSIS — Z87891 Personal history of nicotine dependence: Secondary | ICD-10-CM | POA: Diagnosis not present

## 2023-05-05 DIAGNOSIS — Z1152 Encounter for screening for COVID-19: Secondary | ICD-10-CM | POA: Diagnosis not present

## 2023-05-05 DIAGNOSIS — R0902 Hypoxemia: Secondary | ICD-10-CM | POA: Diagnosis present

## 2023-05-05 DIAGNOSIS — G4733 Obstructive sleep apnea (adult) (pediatric): Secondary | ICD-10-CM | POA: Diagnosis present

## 2023-05-05 DIAGNOSIS — Z86010 Personal history of colonic polyps: Secondary | ICD-10-CM | POA: Diagnosis not present

## 2023-05-05 DIAGNOSIS — R7881 Bacteremia: Secondary | ICD-10-CM | POA: Diagnosis present

## 2023-05-05 DIAGNOSIS — F0153 Vascular dementia, unspecified severity, with mood disturbance: Secondary | ICD-10-CM | POA: Diagnosis present

## 2023-05-05 DIAGNOSIS — Z9049 Acquired absence of other specified parts of digestive tract: Secondary | ICD-10-CM | POA: Diagnosis not present

## 2023-05-05 DIAGNOSIS — Z888 Allergy status to other drugs, medicaments and biological substances status: Secondary | ICD-10-CM | POA: Diagnosis not present

## 2023-05-05 DIAGNOSIS — K219 Gastro-esophageal reflux disease without esophagitis: Secondary | ICD-10-CM | POA: Diagnosis present

## 2023-05-05 DIAGNOSIS — F419 Anxiety disorder, unspecified: Secondary | ICD-10-CM | POA: Diagnosis present

## 2023-05-05 DIAGNOSIS — Z66 Do not resuscitate: Secondary | ICD-10-CM | POA: Diagnosis present

## 2023-05-05 DIAGNOSIS — Z90710 Acquired absence of both cervix and uterus: Secondary | ICD-10-CM | POA: Diagnosis not present

## 2023-05-05 DIAGNOSIS — Z803 Family history of malignant neoplasm of breast: Secondary | ICD-10-CM | POA: Diagnosis not present

## 2023-05-05 DIAGNOSIS — Z79899 Other long term (current) drug therapy: Secondary | ICD-10-CM | POA: Diagnosis not present

## 2023-05-05 LAB — CBC WITH DIFFERENTIAL/PLATELET
Abs Immature Granulocytes: 0.02 10*3/uL (ref 0.00–0.07)
Abs Immature Granulocytes: 0.04 10*3/uL (ref 0.00–0.07)
Basophils Absolute: 0 10*3/uL (ref 0.0–0.1)
Basophils Absolute: 0 10*3/uL (ref 0.0–0.1)
Basophils Relative: 0 %
Basophils Relative: 0 %
Eosinophils Absolute: 0.4 10*3/uL (ref 0.0–0.5)
Eosinophils Absolute: 0.4 10*3/uL (ref 0.0–0.5)
Eosinophils Relative: 5 %
Eosinophils Relative: 5 %
HCT: 29.2 % — ABNORMAL LOW (ref 36.0–46.0)
HCT: 32 % — ABNORMAL LOW (ref 36.0–46.0)
Hemoglobin: 8.8 g/dL — ABNORMAL LOW (ref 12.0–15.0)
Hemoglobin: 9.6 g/dL — ABNORMAL LOW (ref 12.0–15.0)
Immature Granulocytes: 0 %
Immature Granulocytes: 1 %
Lymphocytes Relative: 13 %
Lymphocytes Relative: 13 %
Lymphs Abs: 1 10*3/uL (ref 0.7–4.0)
Lymphs Abs: 1.2 10*3/uL (ref 0.7–4.0)
MCH: 26 pg (ref 26.0–34.0)
MCH: 26 pg (ref 26.0–34.0)
MCHC: 30 g/dL (ref 30.0–36.0)
MCHC: 30.1 g/dL (ref 30.0–36.0)
MCV: 86.4 fL (ref 80.0–100.0)
MCV: 86.7 fL (ref 80.0–100.0)
Monocytes Absolute: 0.5 10*3/uL (ref 0.1–1.0)
Monocytes Absolute: 0.5 10*3/uL (ref 0.1–1.0)
Monocytes Relative: 6 %
Monocytes Relative: 7 %
Neutro Abs: 5.7 10*3/uL (ref 1.7–7.7)
Neutro Abs: 6.6 10*3/uL (ref 1.7–7.7)
Neutrophils Relative %: 75 %
Neutrophils Relative %: 75 %
Platelets: 224 10*3/uL (ref 150–400)
Platelets: 253 10*3/uL (ref 150–400)
RBC: 3.38 MIL/uL — ABNORMAL LOW (ref 3.87–5.11)
RBC: 3.69 MIL/uL — ABNORMAL LOW (ref 3.87–5.11)
RDW: 14.8 % (ref 11.5–15.5)
RDW: 14.9 % (ref 11.5–15.5)
WBC: 7.7 10*3/uL (ref 4.0–10.5)
WBC: 8.7 10*3/uL (ref 4.0–10.5)
nRBC: 0 % (ref 0.0–0.2)
nRBC: 0 % (ref 0.0–0.2)

## 2023-05-05 LAB — COMPREHENSIVE METABOLIC PANEL
ALT: 16 U/L (ref 0–44)
ALT: 16 U/L (ref 0–44)
AST: 18 U/L (ref 15–41)
AST: 13 U/L — ABNORMAL LOW (ref 15–41)
Albumin: 3 g/dL — ABNORMAL LOW (ref 3.5–5.0)
Albumin: 2.8 g/dL — ABNORMAL LOW (ref 3.5–5.0)
Alkaline Phosphatase: 63 U/L (ref 38–126)
Alkaline Phosphatase: 57 U/L (ref 38–126)
Anion gap: 9 (ref 5–15)
Anion gap: 8 (ref 5–15)
BUN: 22 mg/dL (ref 8–23)
BUN: 20 mg/dL (ref 8–23)
CO2: 28 mmol/L (ref 22–32)
CO2: 26 mmol/L (ref 22–32)
Calcium: 8.9 mg/dL (ref 8.9–10.3)
Calcium: 8.5 mg/dL — ABNORMAL LOW (ref 8.9–10.3)
Chloride: 104 mmol/L (ref 98–111)
Chloride: 105 mmol/L (ref 98–111)
Creatinine, Ser: 1.11 mg/dL — ABNORMAL HIGH (ref 0.44–1.00)
Creatinine, Ser: 0.92 mg/dL (ref 0.44–1.00)
GFR, Estimated: 50 mL/min — ABNORMAL LOW (ref >60)
GFR, Estimated: >60 mL/min (ref >60)
Glucose, Bld: 106 mg/dL — ABNORMAL HIGH (ref 70–99)
Glucose, Bld: 95 mg/dL (ref 70–99)
Potassium: 4.2 mmol/L (ref 3.5–5.1)
Potassium: 3.8 mmol/L (ref 3.5–5.1)
Sodium: 141 mmol/L (ref 135–145)
Sodium: 139 mmol/L (ref 135–145)
Total Bilirubin: 0.4 mg/dL (ref 0.3–1.2)
Total Bilirubin: 0.5 mg/dL (ref 0.3–1.2)
Total Protein: 6.3 g/dL — ABNORMAL LOW (ref 6.5–8.1)
Total Protein: 5.8 g/dL — ABNORMAL LOW (ref 6.5–8.1)

## 2023-05-05 LAB — URINALYSIS, W/ REFLEX TO CULTURE (INFECTION SUSPECTED)
Bacteria, UA: NONE SEEN
Bilirubin Urine: NEGATIVE
Glucose, UA: NEGATIVE mg/dL
Hgb urine dipstick: NEGATIVE
Ketones, ur: NEGATIVE mg/dL
Leukocytes,Ua: NEGATIVE
Nitrite: NEGATIVE
Protein, ur: NEGATIVE mg/dL
Specific Gravity, Urine: 1.008 (ref 1.005–1.030)
pH: 7 (ref 5.0–8.0)

## 2023-05-05 LAB — BLOOD CULTURE ID PANEL (REFLEXED) - BCID2

## 2023-05-05 LAB — CULTURE, BLOOD (ROUTINE X 2)
Culture: NO GROWTH
Special Requests: ADEQUATE
Special Requests: ADEQUATE

## 2023-05-05 LAB — RESP PANEL BY RT-PCR (RSV, FLU A&B, COVID)  RVPGX2
Influenza A by PCR: NEGATIVE
Influenza B by PCR: NEGATIVE
Resp Syncytial Virus by PCR: NEGATIVE
SARS Coronavirus 2 by RT PCR: NEGATIVE

## 2023-05-05 LAB — PROTIME-INR
INR: 1 (ref 0.8–1.2)
Prothrombin Time: 13.8 seconds (ref 11.4–15.2)

## 2023-05-05 LAB — PROCALCITONIN: Procalcitonin: 0.5 ng/mL

## 2023-05-05 LAB — APTT: aPTT: 30 seconds (ref 24–36)

## 2023-05-05 LAB — LACTIC ACID, PLASMA: Lactic Acid, Venous: 1 mmol/L (ref 0.5–1.9)

## 2023-05-05 LAB — MAGNESIUM: Magnesium: 1.6 mg/dL — ABNORMAL LOW (ref 1.7–2.4)

## 2023-05-05 MED ORDER — ACETAMINOPHEN 650 MG RE SUPP: 650.0000 mg | Freq: Four times a day (QID) | RECTAL | Status: DC | PRN

## 2023-05-05 MED ORDER — ONDANSETRON HCL 4 MG/2ML IJ SOLN: 4.0000 mg | Freq: Four times a day (QID) | INTRAMUSCULAR | Status: DC | PRN

## 2023-05-05 MED ORDER — MAGNESIUM SULFATE 4 GM/100ML IV SOLN
4.0000 g | Freq: Once | INTRAVENOUS | Status: AC
  Administered 2023-05-05: 4 g via INTRAVENOUS
  Filled 2023-05-05: qty 100

## 2023-05-05 MED ORDER — DOXYCYCLINE HYCLATE 100 MG PO TABS
100.0000 mg | ORAL_TABLET | Freq: Two times a day (BID) | ORAL | Status: DC
  Administered 2023-05-06: 100 mg via ORAL
  Filled 2023-05-05: qty 1

## 2023-05-05 MED ORDER — ONDANSETRON HCL 4 MG PO TABS: 4.0000 mg | ORAL_TABLET | Freq: Four times a day (QID) | ORAL | Status: DC | PRN

## 2023-05-05 MED ORDER — VERAPAMIL HCL ER 180 MG PO TBCR
180.0000 mg | EXTENDED_RELEASE_TABLET | Freq: Every day | ORAL | Status: DC
  Administered 2023-05-05 – 2023-05-06 (×2): 180 mg via ORAL
  Filled 2023-05-05 (×3): qty 1

## 2023-05-05 MED ORDER — DONEPEZIL HCL 5 MG PO TABS
10.0000 mg | ORAL_TABLET | Freq: Every day | ORAL | Status: DC
  Administered 2023-05-05: 10 mg via ORAL
  Filled 2023-05-05: qty 2

## 2023-05-05 MED ORDER — ACETAMINOPHEN 325 MG PO TABS: 650.0000 mg | ORAL_TABLET | Freq: Four times a day (QID) | ORAL | Status: DC | PRN

## 2023-05-05 MED ORDER — OXYCODONE HCL 5 MG PO TABS: 5.0000 mg | ORAL_TABLET | ORAL | Status: DC | PRN

## 2023-05-05 MED ORDER — HEPARIN SODIUM (PORCINE) 5000 UNIT/ML IJ SOLN
5000.0000 [IU] | Freq: Three times a day (TID) | INTRAMUSCULAR | Status: DC
  Administered 2023-05-05 – 2023-05-06 (×4): 5000 [IU] via SUBCUTANEOUS
  Filled 2023-05-05 (×3): qty 1

## 2023-05-05 MED ORDER — ARFORMOTEROL TARTRATE 15 MCG/2ML IN NEBU
15.0000 ug | INHALATION_SOLUTION | Freq: Two times a day (BID) | RESPIRATORY_TRACT | Status: DC
  Administered 2023-05-05 – 2023-05-06 (×3): 15 ug via RESPIRATORY_TRACT
  Filled 2023-05-05 (×4): qty 2

## 2023-05-05 MED ORDER — VANCOMYCIN HCL 1250 MG/250ML IV SOLN: 1250.0000 mg | INTRAVENOUS | Status: DC

## 2023-05-05 MED ORDER — MIRABEGRON ER 25 MG PO TB24
50.0000 mg | ORAL_TABLET | Freq: Every day | ORAL | Status: DC
  Administered 2023-05-05 – 2023-05-06 (×2): 50 mg via ORAL
  Filled 2023-05-05 (×2): qty 2

## 2023-05-05 NOTE — Evaluation (Addendum)
Clinical/Bedside Swallow Evaluation Patient Details  Name: [name] MRN: [id number] Date of Birth: Jan 12, 1941  Today's Date: 05/05/2023 Time: SLP Start Time (ACUTE ONLY): 0620 SLP Stop Time (ACUTE ONLY): 0640 SLP Time Calculation (min) (ACUTE ONLY): 20 min  Past Medical History:  Past Medical History:  Diagnosis Date   Abnormality of gait 06/06/2014   Allergic rhinitis    Anemia    Ankle fracture, right    Anxiety    Arm fracture, left    Arthritis    abnormal gait    COPD (chronic obstructive pulmonary disease) (HCC)    chronic CO2 retention, decreased DLCO    Cystic acne    adult    Degenerative disc disease, cervical    syrinx C3-7   Degenerative disc disease, lumbar    L5 nerve impingement    Dementia (HCC)    Depression    DNR (do not resuscitate)    Fracture of right proximal fibula 07/13/18 09/05/2018   GERD (gastroesophageal reflux disease)    Heme positive stool 10/10/2017   History of colon surgery    right hemicolectomy for high-grade adenomas in right colon 2013   Hyperglycemia    Hypertension    Low back pain    Mediastinal lymphadenopathy 1/9   resolving    Memory difficulty 09/20/2014   mild dementia   Onychomycosis    OSA on CPAP    Sleep apnea    stop bang score 6   Past Surgical History:  Past Surgical History:  Procedure Laterality Date   ABDOMINAL HYSTERECTOMY  1976   secondary to bleeding    APPENDECTOMY     COLON RESECTION  08/18/2012   Procedure: HAND ASSISTED LAPAROSCOPIC COLON RESECTION;  Surgeon: Dalia Heading, MD;  Location: [facility];  Service: General;  Laterality: N/A;   COLONOSCOPY  07/20/2012   Dr. Jena Gauss: multiple colonic polyps with large polyps on right side, s/p saline-assisted debulking piecemeal polypectomy and ablation. Not all removed. Path with tubulovillous adenomas, high grade dysplasia.    COLONOSCOPY N/A 03/09/2013   WUJ:[id number] polyps-tubular adenomas. S/p right hemicolectomy. next tcs 02/2018   epidural steroids      ESOPHAGOGASTRODUODENOSCOPY (EGD) WITH ESOPHAGEAL DILATION N/A 02/06/2014   Procedure: ESOPHAGOGASTRODUODENOSCOPY (EGD) WITH ESOPHAGEAL DILATION;  Surgeon: Corbin Ade, MD;  Location: [facility];  Service: Endoscopy;  Laterality: N/A;  9:30   OOPHORECTOMY  1976   ORIF ANKLE FRACTURE  01/18/2012   Procedure: OPEN REDUCTION INTERNAL FIXATION (ORIF) ANKLE FRACTURE;  Surgeon: Fuller Canada, MD;  Location: [facility];  Service: Orthopedics;  Laterality: Left;   ORIF ANKLE FRACTURE Right 01/13/2016   Procedure: OPEN REDUCTION INTERNAL FIXATION (ORIF) ANKLE FRACTURE;  Surgeon: Vickki Hearing, MD;  Location: [facility];  Service: Orthopedics;  Laterality: Right;   HPI:  82 year old female who presents the ER today as a call back for positive blood cultures.  Patient was seen here the day prior for dyspnea, weakness, fever, cough and wheezing.  Overall workup was relatively reassuring and her oxygen level improved with minimal interventions so she was sent back to the pain center.  It appears that one of her culture bottles came out positive but has not been speciated yet.  Daughter states that the patient still been weak and not acting like her self. Patient has dementia and not able to offer any history but states she feels fine.    Assessment / Plan / Recommendation  Clinical Impression  Clinical swallowing evaluation completed while Pt was  sitting upright in bed; Pt's daughters at bedside report coughing on phlegm and respiratory distress before initial intake yesterday but no ongoing dysphagia. Pt consumed thin liquids and regular textures without overt s/sx of aspiration. Daughter reports Pt requires cues and support during meals secondary to dementia. Recommend continue D3/mech soft (Pts current diet at skilled care facility where she is a resident) and thin liquids. Meds are ok whole with liquids. Universal aspiration precautions were reviewed with Pt and daughters; there are no further ST needs  noted at this time, ST will sign off. Thank you, SLP Visit Diagnosis: Dysphagia, oropharyngeal phase (R13.12)    Aspiration Risk       Diet Recommendation Dysphagia 3 (Mech soft);Thin liquid   Liquid Administration via: Cup;Straw Medication Administration: Whole meds with liquid Supervision: Patient able to self feed Compensations: Minimize environmental distractions;Slow rate;Small sips/bites Postural Changes: Seated upright at 90 degrees    Other  Recommendations Oral Care Recommendations: Oral care BID    Recommendations for follow up therapy are one component of a multi-disciplinary discharge planning process, led by the attending physician.  Recommendations may be updated based on patient status, additional functional criteria and insurance authorization.  Follow up Recommendations No SLP follow up        Swallow Study   General Date of Onset: 05/04/23 HPI: 82 year old female who presents the ER today as a call back for positive blood cultures.  Patient was seen here the day prior for dyspnea, weakness, fever, cough and wheezing.  Overall workup was relatively reassuring and her oxygen level improved with minimal interventions so she was sent back to the pain center.  It appears that one of her culture bottles came out positive but has not been speciated yet.  Daughter states that the patient still been weak and not acting like her self. Patient has dementia and not able to offer any history but states she feels fine. Type of Study: Bedside Swallow Evaluation Previous Swallow Assessment: BSE 2017 Diet Prior to this Study: Regular;Thin liquids (Level 0) Temperature Spikes Noted: No Respiratory Status: Room air History of Recent Intubation: No Behavior/Cognition: Alert;Cooperative;Pleasant mood Oral Care Completed by SLP: Recent completion by staff Oral Cavity - Dentition: Dentures, top;Dentures, bottom Vision: Functional for self-feeding Self-Feeding Abilities: Able to feed  self Patient Positioning: Upright in bed Baseline Vocal Quality: Normal Volitional Cough: Strong Volitional Swallow: Able to elicit    Oral/Motor/Sensory Function Overall Oral Motor/Sensory Function: Within functional limits   Ice Chips Ice chips: Within functional limits   Thin Liquid Thin Liquid: Within functional limits    Nectar Thick Nectar Thick Liquid: Not tested   Honey Thick Honey Thick Liquid: Not tested   Puree Puree: Within functional limits   Solid     Solid: Within functional limits     Janiesha Diehl H. Romie Levee, CCC-SLP Speech Language Pathologist  Georgetta Haber 05/05/2023,6:40 AM

## 2023-05-05 NOTE — H&P (Signed)
History and Physical    Patient: [name] ZOX:[id number] DOB: 1941-11-11 DOA: 05/04/2023 DOS: the patient was seen and examined on 05/05/2023 PCP: Sharee Holster, NP  Patient coming from: SNF  Chief Complaint: No chief complaint on file.  HPI: [name] is a 82 y.o. female with medical history significant of COPD, dementia, depression, GERD, hypertension, SVT, and more presents the ED with a chief complaint of positive blood cultures.  Apparently patient had had an aspiration event on the 18th.  She was choking on "phlegm" and having trouble catching her breath.  Daughter at bedside reports that her temp went up to 102.  They had to bump her oxygen from her baseline of 2 L nasal cannula up to 5 L nasal cannula before bringing her into the ER.  She improved in the ER after breathing treatment, and was sent home.  Due to her fever she had blood culture checked in the ER which today grew gram-positive cocci so family was called and asked to bring patient back in.  Family does report the patient has had some generalized weakness and a decrease in appetite.  Patient is pleasantly demented.  She is not able to provide any history.  According to daughter at bedside there has been no particular illness going around her SNF.  She has had no body sores or ulcers.  She had no known viral pneumonia.  She did get her COVID shot.  No other complaints at this time.  Patient does have a DNR on file. Review of Systems: unable to review all systems due to the inability of the patient to answer questions. Past Medical History:  Diagnosis Date   Abnormality of gait 06/06/2014   Allergic rhinitis    Anemia    Ankle fracture, right    Anxiety    Arm fracture, left    Arthritis    abnormal gait    COPD (chronic obstructive pulmonary disease) (HCC)    chronic CO2 retention, decreased DLCO    Cystic acne    adult    Degenerative disc disease, cervical    syrinx C3-7   Degenerative disc  disease, lumbar    L5 nerve impingement    Dementia (HCC)    Depression    DNR (do not resuscitate)    Fracture of right proximal fibula 07/13/18 09/05/2018   GERD (gastroesophageal reflux disease)    Heme positive stool 10/10/2017   History of colon surgery    right hemicolectomy for high-grade adenomas in right colon 2013   Hyperglycemia    Hypertension    Low back pain    Mediastinal lymphadenopathy 1/9   resolving    Memory difficulty 09/20/2014   mild dementia   Onychomycosis    OSA on CPAP    Sleep apnea    stop bang score 6   Past Surgical History:  Procedure Laterality Date   ABDOMINAL HYSTERECTOMY  1976   secondary to bleeding    APPENDECTOMY     COLON RESECTION  08/18/2012   Procedure: HAND ASSISTED LAPAROSCOPIC COLON RESECTION;  Surgeon: Dalia Heading, MD;  Location: [facility];  Service: General;  Laterality: N/A;   COLONOSCOPY  07/20/2012   Dr. Jena Gauss: multiple colonic polyps with large polyps on right side, s/p saline-assisted debulking piecemeal polypectomy and ablation. Not all removed. Path with tubulovillous adenomas, high grade dysplasia.    COLONOSCOPY N/A 03/09/2013   WJX:BJYNWGN polyps-tubular adenomas. S/p right hemicolectomy. next tcs 02/2018   epidural steroids  ESOPHAGOGASTRODUODENOSCOPY (EGD) WITH ESOPHAGEAL DILATION N/A 02/06/2014   Procedure: ESOPHAGOGASTRODUODENOSCOPY (EGD) WITH ESOPHAGEAL DILATION;  Surgeon: Corbin Ade, MD;  Location: [facility];  Service: Endoscopy;  Laterality: N/A;  9:30   OOPHORECTOMY  1976   ORIF ANKLE FRACTURE  01/18/2012   Procedure: OPEN REDUCTION INTERNAL FIXATION (ORIF) ANKLE FRACTURE;  Surgeon: Fuller Canada, MD;  Location: [facility];  Service: Orthopedics;  Laterality: Left;   ORIF ANKLE FRACTURE Right 01/13/2016   Procedure: OPEN REDUCTION INTERNAL FIXATION (ORIF) ANKLE FRACTURE;  Surgeon: Vickki Hearing, MD;  Location: [facility];  Service: Orthopedics;  Laterality: Right;   Social History:  reports that she quit  smoking about 16 years ago. Her smoking use included cigarettes. She has a 60.00 pack-year smoking history. She has never used smokeless tobacco. She reports that she does not drink alcohol and does not use drugs.  Allergies  Allergen Reactions   Fenofibrate Nausea And Vomiting   Pravastatin Sodium Nausea And Vomiting   Sulfonamide Derivatives Other (See Comments)    unknown   Ciprofloxacin Rash    Family History  Problem Relation Age of Onset   Stomach cancer Father    Cancer Father    Cancer Mother        brain    Breast cancer Mother    Arthritis Other     Prior to Admission medications   Medication Sig Start Date End Date Taking? Authorizing Provider  calcium-vitamin D (OSCAL WITH D) 500-5 MG-MCG tablet Take 1 tablet by mouth daily at 12 noon.    [provider]  cyanocobalamin (VITAMIN B12) 1000 MCG tablet Take 1,000 mcg by mouth daily.    [provider]  donepezil (ARICEPT) 10 MG tablet TAKE 1 TABLET AT BEDTIME 10/16/20   Kerri Perches, MD  loratadine (CLARITIN) 10 MG tablet Take 10 mg by mouth daily.    [provider]  magnesium hydroxide (MILK OF MAGNESIA) 400 MG/5ML suspension Take 30 mLs by mouth daily as needed for mild constipation.    [provider]  mirabegron ER (MYRBETRIQ) 50 MG TB24 tablet Take 50 mg by mouth daily.    [provider]  NON FORMULARY Diet: Regular diet with chopped meats and gravy to all meats.    [provider]  omeprazole (PRILOSEC) 40 MG capsule TAKE 1 CAPSULE EVERY DAY 02/19/21   Freddy Finner, NP  OXYGEN Inhale 2 L into the lungs continuous.    [provider]  potassium chloride SA (KLOR-CON M) 20 MEQ tablet Take 20 mEq by mouth daily. 04/07/22   [provider]  STRIVERDI RESPIMAT 2.5 MCG/ACT AERS INHALE 2 PUFFS ONE TIME DAILY AT THE SAME TIME 10/02/20   Kerri Perches, MD  verapamil (CALAN-SR) 180 MG CR tablet Take 180 mg by mouth daily.    [provider]    Physical Exam: Vitals:   05/05/23 0245 05/05/23 0300 05/05/23 0328 05/05/23 0400  BP: 98/65 126/79  111/71  Pulse: 93 88  (!) 193  Resp: 17 16  13   Temp:   98 F (36.7 C)   TempSrc:   Oral   SpO2: 96% 95%  97%  Weight:      Height:       1.  General: Patient lying supine in bed,  no acute distress   2. Psychiatric: Alert and oriented to self and recognizes family members at bedside but does not know why she is in the hospital, mood and behavior normal  for situation, pleasant and cooperative with exam   3. Neurologic: Speech and language are normal, face is symmetric, moves all 4 extremities voluntarily, at baseline without acute deficits on limited exam   4. HEENMT:  Head is atraumatic, normocephalic, pupils reactive to light, neck is supple, trachea is midline, mucous membranes are moist   5. Respiratory : Lungs are clear to auscultation bilaterally without wheezing, rhonchi, rales, no cyanosis, no increase in work of breathing or accessory muscle use   6. Cardiovascular : Heart rate normal, rhythm is regular, no murmurs, rubs or gallops, no peripheral edema, peripheral pulses palpated   7. Gastrointestinal:  Abdomen is soft, nondistended, nontender to palpation bowel sounds active, no masses or organomegaly palpated   8. Skin:  Skin is warm, dry and intact without rashes, acute lesions, or ulcers on limited exam   9.Musculoskeletal:  No acute deformities or trauma, no asymmetry in tone, no peripheral edema, peripheral pulses palpated, no tenderness to palpation in the extremities  Data Reviewed: In the ED Temp 98.3-99.2, heart rate 74-85, respiratory rate 14-20, blood pressure 106/58-150/84, satting 94-97% on baseline 2 L nasal cannula No leukocytosis with white blood cell count of 8.7, hemoglobin 9.6 Chemistries unremarkable Blood cultures pending Chest x-ray shows possible pneumonia EKG shows a heart rate of 84, sinus rhythm, QTc 496 Patient  was started on LR to 150 MLS per hour and she was started on vancomycin, Flagyl, cefepime Admission requested for positive blood cultures   Assessment and Plan: * Positive blood culture - Blood culture from June 18 shows gram-positive cocci - No reported sores or ulcers - Patient has had some aspiration incidents with some shortness of breath - Chest x-ray shows possible pneumonia - UA is not indicative of infection - Procalcitonin today is inconclusive at 0.50 - Continue vancomycin for coverage of gram-positive cocci - Patient received cefepime and Flagyl in the ER as well - Repeat blood cultures - Possibly contaminant? - Continue to monitor  Aspiration into airway - Reported aspiration event - Speech therapy evaluate swallow - Currently on baseline O2 - Chest x-ray shows possible pneumonia - No fever or dyspnea at this time - Closely monitor for signs of aspiration pneumonia  Vascular dementia without behavioral disturbance (HCC) - Reorient as indicated - Continue Aricept  COPD with hypoxia (HCC) - Continue Brovana  Essential hypertension - Continue verapamil      Advance Care Planning:   Code Status: DNR  Consults: None at this time  Family Communication: Daughter at bedside  Severity of Illness: The appropriate patient status for this patient is OBSERVATION. Observation status is judged to be reasonable and necessary in order to provide the required intensity of service to ensure the patient's safety. The patient's presenting symptoms, physical exam findings, and initial radiographic and laboratory data in the context of their medical condition is felt to place them at decreased risk for further clinical deterioration. Furthermore, it is anticipated that the patient will be medically stable for discharge from the hospital within 2 midnights of admission.   Author: Lilyan Gilford, DO 05/05/2023 4:59 AM  For on call review [email or web address].

## 2023-05-05 NOTE — TOC Initial Note (Addendum)
Transition of Care [facility]) - Initial/Assessment Note    Patient Details  Name: [name] MRN: [id number] Date of Birth: 08/24/1941  [facility] CM/SW Contact:    Villa Herb, LCSWA Phone Number: 05/05/2023, 10:35 AM  Clinical Narrative:                 Pt admitted from [facility] CSW spoke with pts daughter who states that pt is a long term resident at [facility]. Pt has been there for about 2 years. Plan is for return at D/C. CSW spoke to [place] in admissions at [facility], they will just need a new Fl2 prior to pts return. TOC to follow.   Expected Discharge Plan: Long Term Nursing Home Barriers to Discharge: Continued Medical Work up   Patient Goals and CMS Choice Patient states their goals for this hospitalization and ongoing recovery are:: return to facility CMS Medicare.gov Compare Post Acute Care list provided to:: Patient Represenative (must comment) Choice offered to / list presented to : Adult Children [place] ownership interest in [facility].provided to:: Adult Children    Expected Discharge Plan and Services In-house Referral: Clinical Social Work Discharge Planning Services: CM Consult Post Acute Care Choice: Nursing Home Living arrangements for the past 2 months: Skilled Nursing Facility                                      Prior Living Arrangements/Services Living arrangements for the past 2 months: Skilled Nursing Facility Lives with:: Facility Resident Patient language and need for interpreter reviewed:: Yes Do you feel safe going back to the place where you live?: Yes      Need for Family Participation in Patient Care: Yes (Comment) Care giver support system in place?: Yes (comment)   Criminal Activity/Legal Involvement Pertinent to Current Situation/Hospitalization: No - Comment as needed  Activities of Daily Living      Permission Sought/Granted                  Emotional Assessment         Alcohol /  Substance Use: Not Applicable Psych Involvement: No (comment)  Admission diagnosis:  Positive blood culture [R78.81] Patient Active Problem List   Diagnosis Date Noted   Positive blood culture 05/05/2023   Aspiration into airway 05/05/2023   Fall at nursing home 03/28/2023   Seizure-like activity (HCC) 02/02/2023   Vitamin D deficiency 12/01/2022   Osteopenia 07/02/2022   Dysphagia, oropharyngeal phase 01/26/2022   Vascular dementia without behavioral disturbance (HCC) 01/13/2022   Adult failure to thrive syndrome 01/13/2022   SVT (supraventricular tachycardia) 01/07/2022   Hypokalemia 07/27/2021   OAB (overactive bladder) 04/07/2021   Chronic kidney disease (CKD), stage III (moderate) (HCC) 03/27/2021   Low thyroid stimulating hormone (TSH) level 03/27/2021   Insomnia 11/14/2020   Age related osteoporosis 08/29/2020   Major depression in partial remission (HCC) 12/30/2018   COPD with hypoxia (HCC) 07/02/2017   Major neurocognitive disorder (HCC) 01/28/2017   Chronic respiratory failure with hypoxia and hypercapnia (HCC) 01/16/2016   Multiple adenomatous polyps 02/15/2013   Hyperlipidemia LDL goal <100 01/25/2007   Normocytic anemia 10/05/2006   Obstructive sleep apnea 10/05/2006   Essential hypertension 10/05/2006   GERD 10/05/2006   Generalized osteoarthritis of multiple sites 10/05/2006   PCP:  Sharee Holster, NP Pharmacy:  No Pharmacies Listed    Social Determinants of Health (  SDOH) Social History: SDOH Screenings   Food Insecurity: No Food Insecurity (12/02/2020)  Housing: Low Risk  (12/02/2020)  Transportation Needs: No Transportation Needs (12/02/2020)  Alcohol Screen: Low Risk  (12/02/2020)  Depression (PHQ2-9): Low Risk  (10/27/2022)  Financial Resource Strain: Low Risk  (12/02/2020)  Physical Activity: Inactive (12/02/2020)  Social Connections: Socially Isolated (12/02/2020)  Stress: No Stress Concern Present (12/02/2020)  Tobacco Use: Medium Risk (05/04/2023)    SDOH Interventions:     Readmission Risk Interventions     No data to display

## 2023-05-05 NOTE — ED Provider Notes (Signed)
[place] EMERGENCY DEPARTMENT AT [facility] Provider Note   CSN: [id number] Arrival date & time: 05/04/23  2245     History  No chief complaint on file.   [name] is a 82 y.o. female.  82 year old female who presents the ER today as a call back for positive blood cultures.  Patient was seen here the day prior for dyspnea, weakness, fever, cough and wheezing.  Overall workup was relatively reassuring and her oxygen level improved with minimal interventions so she was sent back to the pain center.  It appears that one of her culture bottles came out positive but has not been speciated yet.  Daughter states that the patient still been weak and not acting like her self. Patient has dementia and not able to offer any history but states she feels fine.         Home Medications Prior to Admission medications   Medication Sig Start Date End Date Taking? Authorizing Provider  calcium-vitamin D (OSCAL WITH D) 500-5 MG-MCG tablet Take 1 tablet by mouth daily at 12 noon.    [provider]  cyanocobalamin (VITAMIN B12) 1000 MCG tablet Take 1,000 mcg by mouth daily.    [provider]  donepezil (ARICEPT) 10 MG tablet TAKE 1 TABLET AT BEDTIME 10/16/20   Kerri Perches, MD  loratadine (CLARITIN) 10 MG tablet Take 10 mg by mouth daily.    [provider]  magnesium hydroxide (MILK OF MAGNESIA) 400 MG/5ML suspension Take 30 mLs by mouth daily as needed for mild constipation.    [provider]  mirabegron ER (MYRBETRIQ) 50 MG TB24 tablet Take 50 mg by mouth daily.    [provider]  NON FORMULARY Diet: Regular diet with chopped meats and gravy to all meats.    [provider]  omeprazole (PRILOSEC) 40 MG capsule TAKE 1 CAPSULE EVERY DAY 02/19/21   Freddy Finner, NP  OXYGEN Inhale 2 L into the lungs continuous.    [provider]  potassium chloride SA (KLOR-CON M) 20 MEQ tablet Take 20 mEq by mouth daily.  04/07/22   [provider]  STRIVERDI RESPIMAT 2.5 MCG/ACT AERS INHALE 2 PUFFS ONE TIME DAILY AT THE SAME TIME 10/02/20   Kerri Perches, MD  verapamil (CALAN-SR) 180 MG CR tablet Take 180 mg by mouth daily.    [provider]      Allergies    Fenofibrate, Pravastatin sodium, Sulfonamide derivatives, and Ciprofloxacin    Review of Systems   Review of Systems  Physical Exam Updated Vital Signs BP 111/71   Pulse (!) 193   Temp 98 F (36.7 C) (Oral)   Resp 13   Ht 6\' 1"  (1.854 m)   Wt 73 kg   SpO2 97%   BMI 21.24 kg/m  Physical Exam Vitals and nursing note reviewed.  Constitutional:      Appearance: She is well-developed.  HENT:     Head: Normocephalic and atraumatic.     Mouth/Throat:     Mouth: Mucous membranes are dry.  Cardiovascular:     Rate and Rhythm: Normal rate and regular rhythm.  Pulmonary:     Effort: No respiratory distress.     Breath sounds: No stridor.  Abdominal:     General: There is no distension.  Musculoskeletal:        General: No swelling or tenderness. Normal range of motion.     Cervical back: Normal range of motion.  Skin:  General: Skin is warm and dry.  Neurological:     General: No focal deficit present.     Mental Status: She is alert.     ED Results / Procedures / Treatments   Labs (all labs ordered are listed, but only abnormal results are displayed) Labs Reviewed  COMPREHENSIVE METABOLIC PANEL - Abnormal; Notable for the following components:      Result Value   Glucose, Bld 106 (*)    Creatinine, Ser 1.11 (*)    Total Protein 6.3 (*)    Albumin 3.0 (*)    GFR, Estimated 50 (*)    All other components within normal limits  CBC WITH DIFFERENTIAL/PLATELET - Abnormal; Notable for the following components:   RBC 3.69 (*)    Hemoglobin 9.6 (*)    HCT 32.0 (*)    All other components within normal limits  URINALYSIS, W/ REFLEX TO CULTURE (INFECTION SUSPECTED) - Abnormal; Notable for the following  components:   Color, Urine STRAW (*)    All other components within normal limits  RESP PANEL BY RT-PCR (RSV, FLU A&B, COVID)  RVPGX2  CULTURE, BLOOD (ROUTINE X 2)  CULTURE, BLOOD (ROUTINE X 2)  LACTIC ACID, PLASMA  PROTIME-INR  APTT  PROCALCITONIN    EKG None  Radiology DG Chest Port 1 View  Result Date: 05/04/2023 CLINICAL DATA:  Possible sepsis positive blood cultures EXAM: PORTABLE CHEST 1 VIEW COMPARISON:  05/03/2023, 01/06/2022 FINDINGS: Patchy interstitial and airspace opacity at the bases. No pleural effusion. Normal cardiac size. Aortic atherosclerosis IMPRESSION: Patchy interstitial and airspace opacity at the bases, possible pneumonia. Electronically Signed   By: Jasmine Pang M.D.   On: 05/04/2023 23:31   DG Chest Port 1 View  Result Date: 05/03/2023 CLINICAL DATA:  Shortness of breath. EXAM: PORTABLE CHEST 1 VIEW COMPARISON:  01/06/2022 FINDINGS: Hyperinflation. Mild linear opacity seen at the right lung base likely scar or atelectasis. No consolidation, pneumothorax, effusion or edema. Normal cardiopericardial silhouette. Calcified aorta. Multifocal degenerative changes along the spine. IMPRESSION: Hyperinflation.  Mild right basilar scar or atelectasis. Electronically Signed   By: Karen Kays M.D.   On: 05/03/2023 11:11    Procedures Procedures    Medications Ordered in ED Medications  lactated ringers infusion ( Intravenous New Bag/Given 05/05/23 0025)  ceFEPIme (MAXIPIME) 2 g in sodium chloride 0.9 % 100 mL IVPB (0 g Intravenous Stopped 05/05/23 0110)  metroNIDAZOLE (FLAGYL) IVPB 500 mg (0 mg Intravenous Stopped 05/05/23 0230)  vancomycin (VANCOREADY) IVPB 1500 mg/300 mL (0 mg Intravenous Stopped 05/05/23 0411)    ED Course/ Medical Decision Making/ A&P                             Medical Decision Making Amount and/or Complexity of Data Reviewed Labs: ordered. Radiology: ordered. ECG/medicine tests: ordered.  Risk Prescription drug management. Decision  regarding hospitalization.   Positive blood culture very well could be contaminant since it was only 1 year.  She is afebrile.  She has no leukocytosis.  However she was initially here for breathing complaints and her x-ray looks worse than it did yesterday so antibiotics started.  Not on any decreased oxygen but is the patient's not really reliable historian or she reliable to say when she is feeling bad is probably best just to recheck blood cultures and observe her in the hospital until they clear.  Discussed with hospitalist for admission.   Final Clinical Impression(s) / ED Diagnoses Final diagnoses:  Community acquired pneumonia, unspecified laterality    Rx / DC Orders ED Discharge Orders     None         Ashely Joshua, Barbara Cower, MD 05/05/23 [phone]

## 2023-05-05 NOTE — Progress Notes (Signed)
ASSUMPTION OF CARE NOTE   05/05/2023 12:46 PM  [name] was seen and examined.  The H&P by the admitting provider, orders, imaging was reviewed.  Please see new orders.  Will continue to follow.  THE POSITIVE BLOOD CULTURE IS A CONTAMINANT.  DC VANCOMYCIN, DC CEFEPIME.  PT BEING TREATED FOR A MILD URI. HOPEFULLY CAN RETURN TO [facility] TOMORROW.    Vitals:   05/05/23 1215 05/05/23 1228  BP: (!) 157/88   Pulse: 98   Resp: 16   Temp:  98.5 F (36.9 C)  SpO2: 98%     Results for orders placed or performed during the hospital encounter of 05/04/23  Resp panel by RT-PCR (RSV, Flu A&B, Covid) Anterior Nasal Swab   Specimen: Anterior Nasal Swab  Result Value Ref Range   SARS Coronavirus 2 by RT PCR NEGATIVE NEGATIVE   Influenza A by PCR NEGATIVE NEGATIVE   Influenza B by PCR NEGATIVE NEGATIVE   Resp Syncytial Virus by PCR NEGATIVE NEGATIVE  Blood Culture (routine x 2)   Specimen: Vein; Blood  Result Value Ref Range   Specimen Description BLOOD BLOOD LEFT WRIST    Special Requests      BOTTLES DRAWN AEROBIC AND ANAEROBIC Blood Culture adequate volume Performed at [facility], [address], [place], [place] [address]    Culture PENDING    Report Status PENDING   Blood Culture (routine x 2)   Specimen: Vein; Blood  Result Value Ref Range   Specimen Description BLOOD RIGHT FOREARM    Special Requests      BOTTLES DRAWN AEROBIC AND ANAEROBIC Blood Culture adequate volume Performed at [facility], [address] [place], [place] [address]    Culture PENDING    Report Status PENDING   Lactic acid, plasma  Result Value Ref Range   Lactic Acid, Venous 1.0 0.5 - 1.9 mmol/L  Comprehensive metabolic panel  Result Value Ref Range   Sodium 141 135 - 145 mmol/L   Potassium 4.2 3.5 - 5.1 mmol/L   Chloride 104 98 - 111 mmol/L   CO2 28 22 - 32 mmol/L   Glucose, Bld 106 (H) 70 - 99 mg/dL   BUN 22 8 - 23 mg/dL   Creatinine, Ser 9.52 (H) 0.44 - 1.00 mg/dL   Calcium  8.9 8.9 - 84.1 mg/dL   Total Protein 6.3 (L) 6.5 - 8.1 g/dL   Albumin 3.0 (L) 3.5 - 5.0 g/dL   AST 18 15 - 41 U/L   ALT 16 0 - 44 U/L   Alkaline Phosphatase 63 38 - 126 U/L   Total Bilirubin 0.4 0.3 - 1.2 mg/dL   GFR, Estimated 50 (L) >60 mL/min   Anion gap 9 5 - 15  CBC with Differential  Result Value Ref Range   WBC 8.7 4.0 - 10.5 K/uL   RBC 3.69 (L) 3.87 - 5.11 MIL/uL   Hemoglobin 9.6 (L) 12.0 - 15.0 g/dL   HCT 32.4 (L) 40.1 - 02.7 %   MCV 86.7 80.0 - 100.0 fL   MCH 26.0 26.0 - 34.0 pg   MCHC 30.0 30.0 - 36.0 g/dL   RDW 25.3 66.4 - 40.3 %   Platelets 253 150 - 400 K/uL   nRBC 0.0 0.0 - 0.2 %   Neutrophils Relative % 75 %   Neutro Abs 6.6 1.7 - 7.7 K/uL   Lymphocytes Relative 13 %   Lymphs Abs 1.2 0.7 - 4.0 K/uL   Monocytes Relative 6 %  Monocytes Absolute 0.5 0.1 - 1.0 K/uL   Eosinophils Relative 5 %   Eosinophils Absolute 0.4 0.0 - 0.5 K/uL   Basophils Relative 0 %   Basophils Absolute 0.0 0.0 - 0.1 K/uL   Immature Granulocytes 1 %   Abs Immature Granulocytes 0.04 0.00 - 0.07 K/uL  Urinalysis, w/ Reflex to Culture (Infection Suspected) -Urine, Catheterized  Result Value Ref Range   Specimen Source URINE, CATHETERIZED    Color, Urine STRAW (A) YELLOW   APPearance CLEAR CLEAR   Specific Gravity, Urine 1.008 1.005 - 1.030   pH 7.0 5.0 - 8.0   Glucose, UA NEGATIVE NEGATIVE mg/dL   Hgb urine dipstick NEGATIVE NEGATIVE   Bilirubin Urine NEGATIVE NEGATIVE   Ketones, ur NEGATIVE NEGATIVE mg/dL   Protein, ur NEGATIVE NEGATIVE mg/dL   Nitrite NEGATIVE NEGATIVE   Leukocytes,Ua NEGATIVE NEGATIVE   RBC / HPF 0-5 0 - 5 RBC/hpf   WBC, UA 0-5 0 - 5 WBC/hpf   Bacteria, UA NONE SEEN NONE SEEN   Squamous Epithelial / HPF 0-5 0 - 5 /HPF  Protime-INR  Result Value Ref Range   Prothrombin Time 13.8 11.4 - 15.2 seconds   INR 1.0 0.8 - 1.2  APTT  Result Value Ref Range   aPTT 30 24 - 36 seconds  Procalcitonin  Result Value Ref Range   Procalcitonin 0.50 ng/mL  Comprehensive  metabolic panel  Result Value Ref Range   Sodium 139 135 - 145 mmol/L   Potassium 3.8 3.5 - 5.1 mmol/L   Chloride 105 98 - 111 mmol/L   CO2 26 22 - 32 mmol/L   Glucose, Bld 95 70 - 99 mg/dL   BUN 20 8 - 23 mg/dL   Creatinine, Ser 1.61 0.44 - 1.00 mg/dL   Calcium 8.5 (L) 8.9 - 10.3 mg/dL   Total Protein 5.8 (L) 6.5 - 8.1 g/dL   Albumin 2.8 (L) 3.5 - 5.0 g/dL   AST 13 (L) 15 - 41 U/L   ALT 16 0 - 44 U/L   Alkaline Phosphatase 57 38 - 126 U/L   Total Bilirubin 0.5 0.3 - 1.2 mg/dL   GFR, Estimated >09 >60 mL/min   Anion gap 8 5 - 15  Magnesium  Result Value Ref Range   Magnesium 1.6 (L) 1.7 - 2.4 mg/dL  CBC with Differential/Platelet  Result Value Ref Range   WBC 7.7 4.0 - 10.5 K/uL   RBC 3.38 (L) 3.87 - 5.11 MIL/uL   Hemoglobin 8.8 (L) 12.0 - 15.0 g/dL   HCT 45.4 (L) 09.8 - 11.9 %   MCV 86.4 80.0 - 100.0 fL   MCH 26.0 26.0 - 34.0 pg   MCHC 30.1 30.0 - 36.0 g/dL   RDW 14.7 82.9 - 56.2 %   Platelets 224 150 - 400 K/uL   nRBC 0.0 0.0 - 0.2 %   Neutrophils Relative % 75 %   Neutro Abs 5.7 1.7 - 7.7 K/uL   Lymphocytes Relative 13 %   Lymphs Abs 1.0 0.7 - 4.0 K/uL   Monocytes Relative 7 %   Monocytes Absolute 0.5 0.1 - 1.0 K/uL   Eosinophils Relative 5 %   Eosinophils Absolute 0.4 0.0 - 0.5 K/uL   Basophils Relative 0 %   Basophils Absolute 0.0 0.0 - 0.1 K/uL   Immature Granulocytes 0 %   Abs Immature Granulocytes 0.02 0.00 - 0.07 K/uL   C. Laural Benes, MD [facility]   05/04/2023 10:47 PM How to contact the [facility] Attending or  Consulting provider 7A - 7P or covering provider during after hours 7P -7A, for this patient?  Check the care team in [facility] and look for a) attending/consulting [facility] provider listed and b) the [facility] team listed Log into [email or web address] and use [facility] universal password to access. If you do not have the password, please contact the hospital operator. Locate the [facility] provider you are looking for under [facility] and page to a number that you can  be directly reached. If you still have difficulty reaching the provider, please page the [facility] (Director on Call) for the Hospitalists listed on amion for assistance.

## 2023-05-05 NOTE — Assessment & Plan Note (Signed)
-   Reorient as indicated - Continue Aricept

## 2023-05-05 NOTE — Assessment & Plan Note (Signed)
-   Continue Rosalyn Gess

## 2023-05-05 NOTE — Progress Notes (Signed)
Pharmacy Antibiotic Note  [name] is a 82 y.o. female admitted on 05/04/2023 with bacteremia.  Pharmacy has been consulted for vancomycin dosing.  Plan: Vancomycin 1500mg  IV x1 then 1250mg  IV Q24H. Expected AUC 510.  Height: 6\' 1"  (185.4 cm) Weight: 73 kg (161 lb) IBW/kg (Calculated) : 75.4  Temp (24hrs), Avg:98.5 F (36.9 C), Min:98 F (36.7 C), Max:99.2 F (37.3 C)  Recent Labs  Lab 05/03/23 1109 05/05/23 0018  WBC 7.3 8.7  CREATININE 0.84 1.11*  LATICACIDVEN  --  1.0    Estimated Creatinine Clearance: 45.8 mL/min (A) (by C-G formula based on SCr of 1.11 mg/dL (H)).    Allergies  Allergen Reactions   Fenofibrate Nausea And Vomiting   Pravastatin Sodium Nausea And Vomiting   Sulfonamide Derivatives Other (See Comments)    unknown   Ciprofloxacin Rash    Thank you for allowing pharmacy to be a part of this patient's care.  Vernard Gambles, PharmD, BCPS  05/05/2023 6:00 AM

## 2023-05-05 NOTE — Assessment & Plan Note (Signed)
Continue verapamil.  

## 2023-05-05 NOTE — Assessment & Plan Note (Signed)
-   Reported aspiration event - Speech therapy evaluate swallow - Currently on baseline O2 - Chest x-ray shows possible pneumonia - No fever or dyspnea at this time - Closely monitor for signs of aspiration pneumonia

## 2023-05-05 NOTE — Assessment & Plan Note (Signed)
-   Blood culture from June 18 shows gram-positive cocci - No reported sores or ulcers - Patient has had some aspiration incidents with some shortness of breath - Chest x-ray shows possible pneumonia - UA is not indicative of infection - Procalcitonin today is inconclusive at 0.50 - Continue vancomycin for coverage of gram-positive cocci - Patient received cefepime and Flagyl in the ER as well - Repeat blood cultures - Possibly contaminant? - Continue to monitor

## 2023-05-06 ENCOUNTER — Inpatient Hospital Stay (HOSPITAL_COMMUNITY): Payer: Medicare PPO

## 2023-05-06 DIAGNOSIS — J069 Acute upper respiratory infection, unspecified: Secondary | ICD-10-CM

## 2023-05-06 LAB — CULTURE, BLOOD (ROUTINE X 2)

## 2023-05-06 LAB — BASIC METABOLIC PANEL
Anion gap: 9 (ref 5–15)
BUN: 12 mg/dL (ref 8–23)
CO2: 27 mmol/L (ref 22–32)
Calcium: 8.8 mg/dL — ABNORMAL LOW (ref 8.9–10.3)
Chloride: 103 mmol/L (ref 98–111)
Creatinine, Ser: 0.65 mg/dL (ref 0.44–1.00)
GFR, Estimated: >60 mL/min (ref >60)
Glucose, Bld: 94 mg/dL (ref 70–99)
Potassium: 3.7 mmol/L (ref 3.5–5.1)
Sodium: 139 mmol/L (ref 135–145)

## 2023-05-06 LAB — MAGNESIUM: Magnesium: 2.2 mg/dL (ref 1.7–2.4)

## 2023-05-06 LAB — PROCALCITONIN: Procalcitonin: 0.41 ng/mL

## 2023-05-06 NOTE — TOC Transition Note (Signed)
Transition of Care [facility]) - CM/SW Discharge Note   Patient Details  Name: [name] MRN: [id number] Date of Birth: 06/22/1941  [facility] CM/SW Contact:  Karn Cassis, LCSW Phone Number: 05/06/2023, 10:33 AM   Clinical Narrative: Pt d/c today back to [facility]. Pt's daughter, [name] and SNF aware and agreeable. D/C summary sent to SNF. Pt will transfer with staff. RN given number to call report.       Final next level of care: Skilled Nursing Facility Barriers to Discharge: Barriers Resolved   Patient Goals and CMS Choice CMS Medicare.gov Compare Post Acute Care list provided to:: Patient Represenative (must comment) Choice offered to / list presented to : Adult Children  Discharge Placement                Patient chooses bed at: [facility] Patient to be transferred to facility by: staff Name of family member notified: [name]- daughter Patient and family notified of of transfer: 05/06/23  Discharge Plan and Services Additional resources added to the After Visit Summary for   In-house Referral: Clinical Social Work Discharge Planning Services: CM Consult Post Acute Care Choice: Nursing Home                               Social Determinants of Health (SDOH) Interventions SDOH Screenings   Food Insecurity: No Food Insecurity (05/05/2023)  Housing: Low Risk  (05/05/2023)  Transportation Needs: No Transportation Needs (05/05/2023)  Utilities: Not At Risk (05/05/2023)  Alcohol Screen: Low Risk  (12/02/2020)  Depression (PHQ2-9): Low Risk  (10/27/2022)  Financial Resource Strain: Low Risk  (12/02/2020)  Physical Activity: Inactive (12/02/2020)  Social Connections: Socially Isolated (12/02/2020)  Stress: No Stress Concern Present (12/02/2020)  Tobacco Use: Medium Risk (05/05/2023)     Readmission Risk Interventions     No data to display

## 2023-05-06 NOTE — Discharge Instructions (Signed)
IMPORTANT INFORMATION: PAY CLOSE ATTENTION   PHYSICIAN DISCHARGE INSTRUCTIONS  Follow with Primary care provider  Green, Deborah S, NP  and other consultants as instructed by your Hospitalist Physician  SEEK MEDICAL CARE OR RETURN TO EMERGENCY ROOM IF SYMPTOMS COME BACK, WORSEN OR NEW PROBLEM DEVELOPS   Please note: You were cared for by a hospitalist during your hospital stay. Every effort will be made to forward records to your primary care provider.  You can request that your primary care provider send for your hospital records if they have not received them.  Once you are discharged, your primary care physician will handle any further medical issues. Please note that NO REFILLS for any discharge medications will be authorized once you are discharged, as it is imperative that you return to your primary care physician (or establish a relationship with a primary care physician if you do not have one) for your post hospital discharge needs so that they can reassess your need for medications and monitor your lab values.  Please get a complete blood count and chemistry panel checked by your Primary MD at your next visit, and again as instructed by your Primary MD.  Get Medicines reviewed and adjusted: Please take all your medications with you for your next visit with your Primary MD  Laboratory/radiological data: Please request your Primary MD to go over all hospital tests and procedure/radiological results at the follow up, please ask your primary care provider to get all Hospital records sent to his/her office.  In some cases, they will be blood work, cultures and biopsy results pending at the time of your discharge. Please request that your primary care provider follow up on these results.  If you are diabetic, please bring your blood sugar readings with you to your follow up appointment with primary care.    Please call and make your follow up appointments as soon as possible.    Also Note  the following: If you experience worsening of your admission symptoms, develop shortness of breath, life threatening emergency, suicidal or homicidal thoughts you must seek medical attention immediately by calling 911 or calling your MD immediately  if symptoms less severe.  You must read complete instructions/literature along with all the possible adverse reactions/side effects for all the Medicines you take and that have been prescribed to you. Take any new Medicines after you have completely understood and accpet all the possible adverse reactions/side effects.   Do not drive when taking Pain medications or sleeping medications (Benzodiazepines)  Do not take more than prescribed Pain, Sleep and Anxiety Medications. It is not advisable to combine anxiety,sleep and pain medications without talking with your primary care practitioner  Special Instructions: If you have smoked or chewed Tobacco  in the last 2 yrs please stop smoking, stop any regular Alcohol  and or any Recreational drug use.  Wear Seat belts while driving.  Do not drive if taking any narcotic, mind altering or controlled substances or recreational drugs or alcohol.       

## 2023-05-06 NOTE — Discharge Summary (Addendum)
Physician Discharge Summary  [name] [name]:[id number] DOB: 01-14-1941 DOA: 05/04/2023  PCP: Sharee Holster, NP  Admit date: 05/04/2023 Discharge date: 05/06/2023  Admitted From:  LTC  Disposition: LTC  Recommendations for Outpatient Follow-up:  Follow up with PCP in 1 weeks Dementia/Delirium precautions  Home Health:  LTC  Discharge Condition: STABLE   CODE STATUS: DNR DIET: Dysphagia 3   Brief Hospitalization Summary: Please see all hospital notes, images, labs for full details of the hospitalization. ADMISSION PROVIDER HPI:  82 y.o. female with medical history significant of COPD, dementia, depression, GERD, hypertension, SVT, and more presents the ED with a chief complaint of positive blood cultures.  Apparently patient had had an aspiration event on the 18th.  She was choking on "phlegm" and having trouble catching her breath.  Daughter at bedside reports that her temp went up to 102.  They had to bump her oxygen from her baseline of 2 L nasal cannula up to 5 L nasal cannula before bringing her into the ER.  She improved in the ER after breathing treatment, and was sent home.  Due to her fever she had blood culture checked in the ER which today grew gram-positive cocci so family was called and asked to bring patient back in.  Family does report the patient has had some generalized weakness and a decrease in appetite.  Patient is pleasantly demented.  She is not able to provide any history.  According to daughter at bedside there has been no particular illness going around her SNF.  She has had no body sores or ulcers.  She had no known viral pneumonia.  She did get her COVID shot.  No other complaints at this time.   HOSPITAL COURSE   Patient was admitted into the hospital brought in because of positive blood culture in 1/2 bottles that turned out to be a contaminated specimen.  She had repeat blood cultures done in the hospital that have been negative for 48 hours.  She was  also treated for upper respiratory infection symptoms.  There was concern for aspiration and she was evaluated by the speech therapist who has recommended a dysphagia 3 diet with thin liquids.  Aspiration precautions recommended.  Her chest x-ray now is clear.  She was briefly treated with oral doxycycline.  She has remained afebrile in the hospital.  Her labs are all stable.  She is stable to discharge back to her long-term care facility.  No changes made to her regular medical treatment or medications.    Discharge Diagnoses:  Principal Problem:   Positive blood culture Active Problems:   Essential hypertension   COPD with hypoxia (HCC)   Vascular dementia without behavioral disturbance (HCC)   Aspiration into airway   Acute upper respiratory infection   Discharge Instructions:  Allergies as of 05/06/2023       Reactions   Fenofibrate Nausea And Vomiting   Pravastatin Sodium Nausea And Vomiting   Sulfonamide Derivatives Other (See Comments)   unknown   Ciprofloxacin Rash        Medication List     TAKE these medications    calcium-vitamin D 500-5 MG-MCG tablet Commonly known as: OSCAL WITH D Take 1 tablet by mouth daily at 12 noon.   cyanocobalamin 1000 MCG tablet Commonly known as: VITAMIN B12 Take 1,000 mcg by mouth daily.   donepezil 10 MG tablet Commonly known as: ARICEPT TAKE 1 TABLET AT BEDTIME   loratadine 10 MG tablet Commonly known as: CLARITIN  Take 10 mg by mouth daily.   magnesium hydroxide 400 MG/5ML suspension Commonly known as: MILK OF MAGNESIA Take 30 mLs by mouth daily as needed for mild constipation.   mirabegron ER 50 MG Tb24 tablet Commonly known as: MYRBETRIQ Take 50 mg by mouth daily.   NON FORMULARY Diet: Regular diet with chopped meats and gravy to all meats.   omeprazole 40 MG capsule Commonly known as: PRILOSEC TAKE 1 CAPSULE EVERY DAY   OXYGEN Inhale 2 L into the lungs continuous.   potassium chloride SA 20 MEQ  tablet Commonly known as: KLOR-CON M Take 20 mEq by mouth daily.   Striverdi Respimat 2.5 MCG/ACT Aers Generic drug: Olodaterol HCl INHALE 2 PUFFS ONE TIME DAILY AT THE SAME TIME   verapamil 180 MG CR tablet Commonly known as: CALAN-SR Take 180 mg by mouth daily.        Follow-up Information     Sharee Holster, NP. Schedule an appointment as soon as possible for a visit in 1 week(s).   Specialty: Geriatric Medicine Why: Hospital Follow Up Contact information: [address] [name] [address] [phone]                Allergies  Allergen Reactions   Fenofibrate Nausea And Vomiting   Pravastatin Sodium Nausea And Vomiting   Sulfonamide Derivatives Other (See Comments)    unknown   Ciprofloxacin Rash   Allergies as of 05/06/2023       Reactions   Fenofibrate Nausea And Vomiting   Pravastatin Sodium Nausea And Vomiting   Sulfonamide Derivatives Other (See Comments)   unknown   Ciprofloxacin Rash        Medication List     TAKE these medications    calcium-vitamin D 500-5 MG-MCG tablet Commonly known as: OSCAL WITH D Take 1 tablet by mouth daily at 12 noon.   cyanocobalamin 1000 MCG tablet Commonly known as: VITAMIN B12 Take 1,000 mcg by mouth daily.   donepezil 10 MG tablet Commonly known as: ARICEPT TAKE 1 TABLET AT BEDTIME   loratadine 10 MG tablet Commonly known as: CLARITIN Take 10 mg by mouth daily.   magnesium hydroxide 400 MG/5ML suspension Commonly known as: MILK OF MAGNESIA Take 30 mLs by mouth daily as needed for mild constipation.   mirabegron ER 50 MG Tb24 tablet Commonly known as: MYRBETRIQ Take 50 mg by mouth daily.   NON FORMULARY Diet: Regular diet with chopped meats and gravy to all meats.   omeprazole 40 MG capsule Commonly known as: PRILOSEC TAKE 1 CAPSULE EVERY DAY   OXYGEN Inhale 2 L into the lungs continuous.   potassium chloride SA 20 MEQ tablet Commonly known as: KLOR-CON M Take 20 mEq by mouth  daily.   Striverdi Respimat 2.5 MCG/ACT Aers Generic drug: Olodaterol HCl INHALE 2 PUFFS ONE TIME DAILY AT THE SAME TIME   verapamil 180 MG CR tablet Commonly known as: CALAN-SR Take 180 mg by mouth daily.        Procedures/Studies: DG CHEST PORT 1 VIEW  Result Date: 05/06/2023 CLINICAL DATA:  Cough and congestion EXAM: PORTABLE CHEST 1 VIEW COMPARISON:  05/04/2023 FINDINGS: Cardiac shadow is within normal limits. Aortic calcifications are noted. The lungs are well aerated bilaterally. No focal infiltrate or effusion is seen. No bony abnormality is noted. IMPRESSION: No active disease. Electronically Signed   By: Alcide Clever M.D.   On: 05/06/2023 09:47   DG Chest Port 1 View  Result Date: 05/04/2023 CLINICAL DATA:  Possible sepsis positive blood cultures EXAM: PORTABLE CHEST 1 VIEW COMPARISON:  05/03/2023, 01/06/2022 FINDINGS: Patchy interstitial and airspace opacity at the bases. No pleural effusion. Normal cardiac size. Aortic atherosclerosis IMPRESSION: Patchy interstitial and airspace opacity at the bases, possible pneumonia. Electronically Signed   By: Jasmine Pang M.D.   On: 05/04/2023 23:31   DG Chest Port 1 View  Result Date: 05/03/2023 CLINICAL DATA:  Shortness of breath. EXAM: PORTABLE CHEST 1 VIEW COMPARISON:  01/06/2022 FINDINGS: Hyperinflation. Mild linear opacity seen at the right lung base likely scar or atelectasis. No consolidation, pneumothorax, effusion or edema. Normal cardiopericardial silhouette. Calcified aorta. Multifocal degenerative changes along the spine. IMPRESSION: Hyperinflation.  Mild right basilar scar or atelectasis. Electronically Signed   By: Karen Kays M.D.   On: 05/03/2023 11:11     Subjective: Pt seems back to her baseline. She has known dementia.  No behavioral problems.  No respiratory symptoms.   Discharge Exam: Vitals:   05/06/23 0550 05/06/23 0839  BP: (!) 162/90   Pulse: 93   Resp: 18   Temp: 97.9 F (36.6 C)   SpO2: (!) 88% 93%    Vitals:   05/05/23 2022 05/06/23 0040 05/06/23 0550 05/06/23 0839  BP: (!) 148/75 (!) 149/78 (!) 162/90   Pulse: 84 82 93   Resp: 20 20 18    Temp: 98.8 F (37.1 C) 97.7 F (36.5 C) 97.9 F (36.6 C)   TempSrc: Oral Oral Oral   SpO2: 96% (!) 88% (!) 88% 93%  Weight:      Height:        General: Pt is alert, awake, not in acute distress, pt is confused with dementia  Cardiovascular: normal S1/S2 +, no rubs, no gallops Respiratory: CTA bilaterally, no wheezing, no rhonchi Abdominal: Soft, NT, ND, bowel sounds + Extremities: no edema, no cyanosis   The results of significant diagnostics from this hospitalization (including imaging, microbiology, ancillary and laboratory) are listed below for reference.     Microbiology: Recent Results (from the past 240 hour(s))  Culture, blood (Routine X 2) w Reflex to ID Panel     Status: None (Preliminary result)   Collection Time: 05/03/23  3:15 PM   Specimen: BLOOD  Result Value Ref Range Status   Specimen Description BLOOD RIGHT ASSIST CONTROL  Final   Special Requests   Final    BOTTLES DRAWN AEROBIC AND ANAEROBIC Blood Culture adequate volume   Culture   Final    NO GROWTH 3 DAYS Performed at [facility], [address], [place], [place] [address]    Report Status PENDING  Incomplete  Culture, blood (Routine X 2) w Reflex to ID Panel     Status: Abnormal   Collection Time: 05/03/23  3:25 PM   Specimen: BLOOD  Result Value Ref Range Status   Specimen Description   Final    BLOOD LEFT HAND Performed at [facility], [address] [place], [place] [address]    Special Requests   Final    BOTTLES DRAWN AEROBIC AND ANAEROBIC Blood Culture adequate volume Performed at [facility], [address], [place], [place] [address]    Culture  Setup Time   Final    GRAM POSITIVE COCCI Gram Stain Report Called to,Read Back By and Verified With: CHILDS,K ON 05/04/23 AT 2125 BY LOY,C ANAEROBIC BOTTLE ONLY CRITICAL RESULT CALLED TO, READ  BACK BY AND VERIFIED WITH: S VILLALOUBOS,RN@0209  05/05/23 MK    Culture (A)  Final    STAPHYLOCOCCUS CAPITIS  THE SIGNIFICANCE OF ISOLATING THIS ORGANISM FROM A SINGLE SET OF BLOOD CULTURES WHEN MULTIPLE SETS ARE DRAWN IS UNCERTAIN. PLEASE NOTIFY THE MICROBIOLOGY DEPARTMENT WITHIN ONE WEEK IF SPECIATION AND SENSITIVITIES ARE REQUIRED. Performed at [facility], [address], [place], [place] [address]    Report Status 05/06/2023 FINAL  Final  Blood Culture ID Panel (Reflexed)     Status: Abnormal   Collection Time: 05/03/23  3:25 PM  Result Value Ref Range Status   Enterococcus faecalis NOT DETECTED NOT DETECTED Final   Enterococcus Faecium NOT DETECTED NOT DETECTED Final   Listeria monocytogenes NOT DETECTED NOT DETECTED Final   Staphylococcus species DETECTED (A) NOT DETECTED Final    Comment: CRITICAL RESULT CALLED TO, READ BACK BY AND VERIFIED WITH: S VILLALOUBOS,RN@0209  05/05/23 MK    Staphylococcus aureus (BCID) NOT DETECTED NOT DETECTED Final   Staphylococcus epidermidis NOT DETECTED NOT DETECTED Final   Staphylococcus lugdunensis NOT DETECTED NOT DETECTED Final   Streptococcus species NOT DETECTED NOT DETECTED Final   Streptococcus agalactiae NOT DETECTED NOT DETECTED Final   Streptococcus pneumoniae NOT DETECTED NOT DETECTED Final   Streptococcus pyogenes NOT DETECTED NOT DETECTED Final   A.calcoaceticus-baumannii NOT DETECTED NOT DETECTED Final   Bacteroides fragilis NOT DETECTED NOT DETECTED Final   Enterobacterales NOT DETECTED NOT DETECTED Final   Enterobacter cloacae complex NOT DETECTED NOT DETECTED Final   Escherichia coli NOT DETECTED NOT DETECTED Final   Klebsiella aerogenes NOT DETECTED NOT DETECTED Final   Klebsiella oxytoca NOT DETECTED NOT DETECTED Final   Klebsiella pneumoniae NOT DETECTED NOT DETECTED Final   Proteus species NOT DETECTED NOT DETECTED Final   Salmonella species NOT DETECTED NOT DETECTED Final   Serratia marcescens NOT DETECTED NOT  DETECTED Final   Haemophilus influenzae NOT DETECTED NOT DETECTED Final   Neisseria meningitidis NOT DETECTED NOT DETECTED Final   Pseudomonas aeruginosa NOT DETECTED NOT DETECTED Final   Stenotrophomonas maltophilia NOT DETECTED NOT DETECTED Final   Candida albicans NOT DETECTED NOT DETECTED Final   Candida auris NOT DETECTED NOT DETECTED Final   Candida glabrata NOT DETECTED NOT DETECTED Final   Candida krusei NOT DETECTED NOT DETECTED Final   Candida parapsilosis NOT DETECTED NOT DETECTED Final   Candida tropicalis NOT DETECTED NOT DETECTED Final   Cryptococcus neoformans/gattii NOT DETECTED NOT DETECTED Final    Comment: Performed at [facility], [address], [place], [place] [address]  Urine Culture     Status: None   Collection Time: 05/03/23  4:30 PM   Specimen: Urine, Random  Result Value Ref Range Status   Specimen Description   Final    URINE, RANDOM Performed at [facility], [address] [place], [place] [address]    Special Requests   Final    NONE Performed at [facility], [address] [place], [place] [address]    Culture   Final    NO GROWTH Performed at [facility], [address] [place], [place] [address]    Report Status 05/04/2023 FINAL  Final  Resp panel by RT-PCR (RSV, Flu A&B, Covid) Anterior Nasal Swab     Status: None   Collection Time: 05/04/23 11:12 PM   Specimen: Anterior Nasal Swab  Result Value Ref Range Status   SARS Coronavirus 2 by RT PCR NEGATIVE NEGATIVE Final    Comment: (NOTE) SARS-CoV-2 target nucleic acids are NOT DETECTED.  The SARS-CoV-2 RNA is generally detectable in upper respiratory specimens during the acute phase of  infection. The lowest concentration of SARS-CoV-2 viral copies this assay can detect is 138 copies/mL. A negative result does not preclude SARS-Cov-2 infection and should not be used as the sole basis for treatment or other patient management decisions. A negative result may occur with   improper specimen collection/handling, submission of specimen other than nasopharyngeal swab, presence of viral mutation(s) within the areas targeted by this assay, and inadequate number of viral copies(<138 copies/mL). A negative result must be combined with clinical observations, patient history, and epidemiological information. The expected result is Negative.  Fact Sheet for Patients:  BloggerCourse.com  Fact Sheet for Healthcare Providers:  SeriousBroker.it  This test is no t yet approved or cleared by the [place] FDA and  has been authorized for detection and/or diagnosis of SARS-CoV-2 by FDA under an Emergency Use Authorization (EUA). This EUA will remain  in effect (meaning this test can be used) for the duration of the COVID-19 declaration under Section 564(b)(1) of the Act, 21 U.S.C.section 360bbb-3(b)(1), unless the authorization is terminated  or revoked sooner.       Influenza A by PCR NEGATIVE NEGATIVE Final   Influenza B by PCR NEGATIVE NEGATIVE Final    Comment: (NOTE) The Xpert Xpress SARS-CoV-2/FLU/RSV plus assay is intended as an aid in the diagnosis of influenza from Nasopharyngeal swab specimens and should not be used as a sole basis for treatment. Nasal washings and aspirates are unacceptable for Xpert Xpress SARS-CoV-2/FLU/RSV testing.  Fact Sheet for Patients: BloggerCourse.com  Fact Sheet for Healthcare Providers: SeriousBroker.it  This test is not yet approved or cleared by the [place] FDA and has been authorized for detection and/or diagnosis of SARS-CoV-2 by FDA under an Emergency Use Authorization (EUA). This EUA will remain in effect (meaning this test can be used) for the duration of the COVID-19 declaration under Section 564(b)(1) of the Act, 21 U.S.C. section 360bbb-3(b)(1), unless the authorization is terminated or revoked.      Resp Syncytial Virus by PCR NEGATIVE NEGATIVE Final    Comment: (NOTE) Fact Sheet for Patients: BloggerCourse.com  Fact Sheet for Healthcare Providers: SeriousBroker.it  This test is not yet approved or cleared by the [place] FDA and has been authorized for detection and/or diagnosis of SARS-CoV-2 by FDA under an Emergency Use Authorization (EUA). This EUA will remain in effect (meaning this test can be used) for the duration of the COVID-19 declaration under Section 564(b)(1) of the Act, 21 U.S.C. section 360bbb-3(b)(1), unless the authorization is terminated or revoked.  Performed at [facility], [address] [place], [place] [address]   Blood Culture (routine x 2)     Status: None (Preliminary result)   Collection Time: 05/05/23 12:18 AM   Specimen: BLOOD RIGHT FOREARM  Result Value Ref Range Status   Specimen Description BLOOD RIGHT FOREARM  Final   Special Requests   Final    BOTTLES DRAWN AEROBIC AND ANAEROBIC Blood Culture adequate volume   Culture   Final    NO GROWTH 1 DAY Performed at [facility], [address], [place], [place] [address]    Report Status PENDING  Incomplete  Blood Culture (routine x 2)     Status: None (Preliminary result)   Collection Time: 05/05/23 12:26 AM   Specimen: BLOOD  Result Value Ref Range Status   Specimen Description BLOOD BLOOD LEFT WRIST  Final   Special Requests   Final    BOTTLES DRAWN AEROBIC AND ANAEROBIC Blood Culture adequate volume   Culture  Final    NO GROWTH 1 DAY Performed at [facility], [address], [place], [place] [address]    Report Status PENDING  Incomplete     Labs: BNP (last 3 results) Recent Labs    05/03/23 1109  BNP 51.0   Basic Metabolic Panel: Recent Labs  Lab 05/03/23 1109 05/05/23 0018 05/05/23 0619 05/06/23 0424  NA 139 141 139 139  K 3.7 4.2 3.8 3.7  CL 102 104 105 103  CO2 28 28 26 27   GLUCOSE 101* 106* 95 94  BUN 16  22 20 12   CREATININE 0.84 1.11* 0.92 0.65  CALCIUM 8.9 8.9 8.5* 8.8*  MG  --   --  1.6* 2.2   Liver Function Tests: Recent Labs  Lab 05/03/23 1109 05/05/23 0018 05/05/23 0619  AST 14* 18 13*  ALT 13 16 16   ALKPHOS 63 63 57  BILITOT 0.7 0.4 0.5  PROT 6.4* 6.3* 5.8*  ALBUMIN 3.3* 3.0* 2.8*   No results for input(s): "LIPASE", "AMYLASE" in the last 168 hours. No results for input(s): "AMMONIA" in the last 168 hours. CBC: Recent Labs  Lab 05/03/23 1109 05/05/23 0018 05/05/23 0619  WBC 7.3 8.7 7.7  NEUTROABS 6.0 6.6 5.7  HGB 9.5* 9.6* 8.8*  HCT 31.7* 32.0* 29.2*  MCV 86.1 86.7 86.4  PLT 240 253 224   Cardiac Enzymes: No results for input(s): "CKTOTAL", "CKMB", "CKMBINDEX", "TROPONINI" in the last 168 hours. BNP: Invalid input(s): "POCBNP" CBG: No results for input(s): "GLUCAP" in the last 168 hours. D-Dimer No results for input(s): "DDIMER" in the last 72 hours. Hgb A1c No results for input(s): "HGBA1C" in the last 72 hours. Lipid Profile No results for input(s): "CHOL", "HDL", "LDLCALC", "TRIG", "CHOLHDL", "LDLDIRECT" in the last 72 hours. Thyroid function studies No results for input(s): "TSH", "T4TOTAL", "T3FREE", "THYROIDAB" in the last 72 hours.  Invalid input(s): "FREET3" Anemia work up No results for input(s): "VITAMINB12", "FOLATE", "FERRITIN", "TIBC", "IRON", "RETICCTPCT" in the last 72 hours. Urinalysis    Component Value Date/Time   COLORURINE STRAW (A) 05/05/2023 0137   APPEARANCEUR CLEAR 05/05/2023 0137   LABSPEC 1.008 05/05/2023 0137   PHURINE 7.0 05/05/2023 0137   GLUCOSEU NEGATIVE 05/05/2023 0137   HGBUR NEGATIVE 05/05/2023 0137   HGBUR negative 10/30/2010 0822   BILIRUBINUR NEGATIVE 05/05/2023 0137   BILIRUBINUR negative 09/30/2020 1219   BILIRUBINUR small 05/20/2014 1349   KETONESUR NEGATIVE 05/05/2023 0137   PROTEINUR NEGATIVE 05/05/2023 0137   UROBILINOGEN 0.2 09/30/2020 1219   UROBILINOGEN 0.2 06/22/2014 2250   NITRITE NEGATIVE  05/05/2023 0137   LEUKOCYTESUR NEGATIVE 05/05/2023 0137   Sepsis Labs Recent Labs  Lab 05/03/23 1109 05/05/23 0018 05/05/23 0619  WBC 7.3 8.7 7.7   Microbiology Recent Results (from the past 240 hour(s))  Culture, blood (Routine X 2) w Reflex to ID Panel     Status: None (Preliminary result)   Collection Time: 05/03/23  3:15 PM   Specimen: BLOOD  Result Value Ref Range Status   Specimen Description BLOOD RIGHT ASSIST CONTROL  Final   Special Requests   Final    BOTTLES DRAWN AEROBIC AND ANAEROBIC Blood Culture adequate volume   Culture   Final    NO GROWTH 3 DAYS Performed at [facility], [address] [place], [place] [address]    Report Status PENDING  Incomplete  Culture, blood (Routine X 2) w Reflex to ID Panel     Status: Abnormal   Collection Time: 05/03/23  3:25 PM  Specimen: BLOOD  Result Value Ref Range Status   Specimen Description   Final    BLOOD LEFT HAND Performed at [facility], [address] [place], [place] [address]    Special Requests   Final    BOTTLES DRAWN AEROBIC AND ANAEROBIC Blood Culture adequate volume Performed at [facility], [address], [place], [place] [address]    Culture  Setup Time   Final    GRAM POSITIVE COCCI Gram Stain Report Called to,Read Back By and Verified With: CHILDS,K ON 05/04/23 AT 2125 BY LOY,C ANAEROBIC BOTTLE ONLY CRITICAL RESULT CALLED TO, READ BACK BY AND VERIFIED WITH: S VILLALOUBOS,RN@0209  05/05/23 MK    Culture (A)  Final    STAPHYLOCOCCUS CAPITIS THE SIGNIFICANCE OF ISOLATING THIS ORGANISM FROM A SINGLE SET OF BLOOD CULTURES WHEN MULTIPLE SETS ARE DRAWN IS UNCERTAIN. PLEASE NOTIFY THE MICROBIOLOGY DEPARTMENT WITHIN ONE WEEK IF SPECIATION AND SENSITIVITIES ARE REQUIRED. Performed at [facility], [address] [place], [place] [address]    Report Status 05/06/2023 FINAL  Final  Blood Culture ID Panel (Reflexed)     Status: Abnormal   Collection Time: 05/03/23  3:25 PM  Result Value Ref Range  Status   Enterococcus faecalis NOT DETECTED NOT DETECTED Final   Enterococcus Faecium NOT DETECTED NOT DETECTED Final   Listeria monocytogenes NOT DETECTED NOT DETECTED Final   Staphylococcus species DETECTED (A) NOT DETECTED Final    Comment: CRITICAL RESULT CALLED TO, READ BACK BY AND VERIFIED WITH: S VILLALOUBOS,RN@0209  05/05/23 MK    Staphylococcus aureus (BCID) NOT DETECTED NOT DETECTED Final   Staphylococcus epidermidis NOT DETECTED NOT DETECTED Final   Staphylococcus lugdunensis NOT DETECTED NOT DETECTED Final   Streptococcus species NOT DETECTED NOT DETECTED Final   Streptococcus agalactiae NOT DETECTED NOT DETECTED Final   Streptococcus pneumoniae NOT DETECTED NOT DETECTED Final   Streptococcus pyogenes NOT DETECTED NOT DETECTED Final   A.calcoaceticus-baumannii NOT DETECTED NOT DETECTED Final   Bacteroides fragilis NOT DETECTED NOT DETECTED Final   Enterobacterales NOT DETECTED NOT DETECTED Final   Enterobacter cloacae complex NOT DETECTED NOT DETECTED Final   Escherichia coli NOT DETECTED NOT DETECTED Final   Klebsiella aerogenes NOT DETECTED NOT DETECTED Final   Klebsiella oxytoca NOT DETECTED NOT DETECTED Final   Klebsiella pneumoniae NOT DETECTED NOT DETECTED Final   Proteus species NOT DETECTED NOT DETECTED Final   Salmonella species NOT DETECTED NOT DETECTED Final   Serratia marcescens NOT DETECTED NOT DETECTED Final   Haemophilus influenzae NOT DETECTED NOT DETECTED Final   Neisseria meningitidis NOT DETECTED NOT DETECTED Final   Pseudomonas aeruginosa NOT DETECTED NOT DETECTED Final   Stenotrophomonas maltophilia NOT DETECTED NOT DETECTED Final   Candida albicans NOT DETECTED NOT DETECTED Final   Candida auris NOT DETECTED NOT DETECTED Final   Candida glabrata NOT DETECTED NOT DETECTED Final   Candida krusei NOT DETECTED NOT DETECTED Final   Candida parapsilosis NOT DETECTED NOT DETECTED Final   Candida tropicalis NOT DETECTED NOT DETECTED Final   Cryptococcus  neoformans/gattii NOT DETECTED NOT DETECTED Final    Comment: Performed at [facility], [address] [place], [place] [address]  Urine Culture     Status: None   Collection Time: 05/03/23  4:30 PM   Specimen: Urine, Random  Result Value Ref Range Status   Specimen Description   Final    URINE, RANDOM Performed at [facility], [address], [place], [place] [address]    Special Requests  Final    NONE Performed at [facility], [address], [place], [place] [address]    Culture   Final    NO GROWTH Performed at [facility], [address], [place], [place] [address]    Report Status 05/04/2023 FINAL  Final  Resp panel by RT-PCR (RSV, Flu A&B, Covid) Anterior Nasal Swab     Status: None   Collection Time: 05/04/23 11:12 PM   Specimen: Anterior Nasal Swab  Result Value Ref Range Status   SARS Coronavirus 2 by RT PCR NEGATIVE NEGATIVE Final    Comment: (NOTE) SARS-CoV-2 target nucleic acids are NOT DETECTED.  The SARS-CoV-2 RNA is generally detectable in upper respiratory specimens during the acute phase of infection. The lowest concentration of SARS-CoV-2 viral copies this assay can detect is 138 copies/mL. A negative result does not preclude SARS-Cov-2 infection and should not be used as the sole basis for treatment or other patient management decisions. A negative result may occur with  improper specimen collection/handling, submission of specimen other than nasopharyngeal swab, presence of viral mutation(s) within the areas targeted by this assay, and inadequate number of viral copies(<138 copies/mL). A negative result must be combined with clinical observations, patient history, and epidemiological information. The expected result is Negative.  Fact Sheet for Patients:  BloggerCourse.com  Fact Sheet for Healthcare Providers:  SeriousBroker.it  This test is no t yet approved or cleared by the [place] FDA and  has been authorized for detection and/or diagnosis of SARS-CoV-2 by FDA under an Emergency Use Authorization (EUA). This EUA will remain  in effect (meaning this test can be used) for the duration of the COVID-19 declaration under Section 564(b)(1) of the Act, 21 U.S.C.section 360bbb-3(b)(1), unless the authorization is terminated  or revoked sooner.       Influenza A by PCR NEGATIVE NEGATIVE Final   Influenza B by PCR NEGATIVE NEGATIVE Final    Comment: (NOTE) The Xpert Xpress SARS-CoV-2/FLU/RSV plus assay is intended as an aid in the diagnosis of influenza from Nasopharyngeal swab specimens and should not be used as a sole basis for treatment. Nasal washings and aspirates are unacceptable for Xpert Xpress SARS-CoV-2/FLU/RSV testing.  Fact Sheet for Patients: BloggerCourse.com  Fact Sheet for Healthcare Providers: SeriousBroker.it  This test is not yet approved or cleared by the [place] FDA and has been authorized for detection and/or diagnosis of SARS-CoV-2 by FDA under an Emergency Use Authorization (EUA). This EUA will remain in effect (meaning this test can be used) for the duration of the COVID-19 declaration under Section 564(b)(1) of the Act, 21 U.S.C. section 360bbb-3(b)(1), unless the authorization is terminated or revoked.     Resp Syncytial Virus by PCR NEGATIVE NEGATIVE Final    Comment: (NOTE) Fact Sheet for Patients: BloggerCourse.com  Fact Sheet for Healthcare Providers: SeriousBroker.it  This test is not yet approved or cleared by the [place] FDA and has been authorized for detection and/or diagnosis of SARS-CoV-2 by FDA under an Emergency Use Authorization (EUA). This EUA will remain in effect (meaning this test can be used) for the duration of the COVID-19 declaration under Section 564(b)(1) of the Act, 21 U.S.C. section  360bbb-3(b)(1), unless the authorization is terminated or revoked.  Performed at [facility], [address], [place], [place] [address]   Blood Culture (routine x 2)     Status: None (Preliminary result)   Collection Time: 05/05/23 12:18 AM   Specimen: BLOOD RIGHT FOREARM  Result Value Ref Range  Status   Specimen Description BLOOD RIGHT FOREARM  Final   Special Requests   Final    BOTTLES DRAWN AEROBIC AND ANAEROBIC Blood Culture adequate volume   Culture   Final    NO GROWTH 1 DAY Performed at [facility], [address], [place], [place] [address]    Report Status PENDING  Incomplete  Blood Culture (routine x 2)     Status: None (Preliminary result)   Collection Time: 05/05/23 12:26 AM   Specimen: BLOOD  Result Value Ref Range Status   Specimen Description BLOOD BLOOD LEFT WRIST  Final   Special Requests   Final    BOTTLES DRAWN AEROBIC AND ANAEROBIC Blood Culture adequate volume   Culture   Final    NO GROWTH 1 DAY Performed at [facility], [address] [place], [place] [address]    Report Status PENDING  Incomplete   Time coordinating discharge:   SIGNED:  Standley Dakins, MD  [facility] 05/06/2023, 10:08 AM How to contact the [facility] Attending or Consulting provider 7A - 7P or covering provider during after hours 7P -7A, for this patient?  Check the care team in [facility] and look for a) attending/consulting [facility] provider listed and b) the [facility] team listed Log into [email or web address] and use [place]'s universal password to access. If you do not have the password, please contact the hospital operator. Locate the [facility] provider you are looking for under [facility] and page to a number that you can be directly reached. If you still have difficulty reaching the provider, please page the [facility] (Director on Call) for the Hospitalists listed on amion for assistance.

## 2023-05-06 NOTE — [place] FL2 (Signed)
[place] MEDICAID FL2 LEVEL OF CARE FORM     IDENTIFICATION  Patient Name: [name] Birthdate: [birth year] Sex: female Admission Date (Current Location): 05/04/2023  [place] and [place] Number:  Aaron Edelman [id number] P Facility and Address:  [facility],  [address], [place] [address]      Provider Number: [id number]  Attending Physician Name and Address:  Cleora Fleet, MD  Relative Name and Phone Number:       Current Level of Care: Hospital Recommended Level of Care: Nursing Facility Prior Approval Number:    Date Approved/Denied:   PASRR Number:    Discharge Plan: SNF    Current Diagnoses: Patient Active Problem List   Diagnosis Date Noted   Positive blood culture 05/05/2023   Aspiration into airway 05/05/2023   Acute upper respiratory infection 05/05/2023   Fall at nursing home 03/28/2023   Seizure-like activity (HCC) 02/02/2023   Vitamin D deficiency 12/01/2022   Osteopenia 07/02/2022   Dysphagia, oropharyngeal phase 01/26/2022   Vascular dementia without behavioral disturbance (HCC) 01/13/2022   Adult failure to thrive syndrome 01/13/2022   SVT (supraventricular tachycardia) 01/07/2022   Hypokalemia 07/27/2021   OAB (overactive bladder) 04/07/2021   Chronic kidney disease (CKD), stage III (moderate) (HCC) 03/27/2021   Low thyroid stimulating hormone (TSH) level 03/27/2021   Insomnia 11/14/2020   Age related osteoporosis 08/29/2020   Major depression in partial remission (HCC) 12/30/2018   COPD with hypoxia (HCC) 07/02/2017   Major neurocognitive disorder (HCC) 01/28/2017   Chronic respiratory failure with hypoxia and hypercapnia (HCC) 01/16/2016   Multiple adenomatous polyps 02/15/2013   Hyperlipidemia LDL goal <100 01/25/2007   Normocytic anemia 10/05/2006   Obstructive sleep apnea 10/05/2006   Essential hypertension 10/05/2006   GERD 10/05/2006   Generalized osteoarthritis of multiple sites 10/05/2006     Orientation RESPIRATION BLADDER Height & Weight     Self  Normal Continent Weight: 162 lb 14.7 oz (73.9 kg) Height:  6\' 1"  (185.4 cm)  BEHAVIORAL SYMPTOMS/MOOD NEUROLOGICAL BOWEL NUTRITION STATUS      Continent Diet (Soft diet)  AMBULATORY STATUS COMMUNICATION OF NEEDS Skin   Limited Assist Verbally Normal                       Personal Care Assistance Level of Assistance  Bathing, Feeding, Dressing Bathing Assistance: Maximum assistance Feeding assistance: Limited assistance Dressing Assistance: Maximum assistance     Functional Limitations Info  Sight, Hearing, Speech Sight Info: Adequate Hearing Info: Adequate Speech Info: Adequate    SPECIAL CARE FACTORS FREQUENCY                       Contractures Contractures Info: Not present    Additional Factors Info  Code Status, Allergies Code Status Info: DNR Allergies Info: Fenofibrate, Pravastatin Sodium, Sulfonamide Derivatives, Ciprofloxacin           Current Medications (05/06/2023):  This is the current hospital active medication list Current Facility-Administered Medications  Medication Dose Route Frequency Provider Last Rate Last Admin   acetaminophen (TYLENOL) tablet 650 mg  650 mg Oral Q6H PRN Zierle-Ghosh, Asia B, DO       Or   acetaminophen (TYLENOL) suppository 650 mg  650 mg Rectal Q6H PRN Zierle-Ghosh, Asia B, DO       arformoterol (BROVANA) nebulizer solution 15 mcg  15 mcg Nebulization BID Zierle-Ghosh, Asia B, DO   15 mcg at 05/05/23 2018   donepezil (ARICEPT) tablet 10  mg  10 mg Oral QHS Zierle-Ghosh, Asia B, DO   10 mg at 05/05/23 2119   doxycycline (VIBRA-TABS) tablet 100 mg  100 mg Oral Q12H Johnson, Clanford L, MD       heparin injection 5,000 Units  5,000 Units Subcutaneous Q8H Zierle-Ghosh, Asia B, DO   5,000 Units at 05/06/23 0446   mirabegron ER (MYRBETRIQ) tablet 50 mg  50 mg Oral Daily Zierle-Ghosh, Asia B, DO   50 mg at 05/05/23 0938   ondansetron (ZOFRAN) tablet 4 mg  4 mg  Oral Q6H PRN Zierle-Ghosh, Asia B, DO       Or   ondansetron (ZOFRAN) injection 4 mg  4 mg Intravenous Q6H PRN Zierle-Ghosh, Asia B, DO       oxyCODONE (Oxy IR/ROXICODONE) immediate release tablet 5 mg  5 mg Oral Q4H PRN Zierle-Ghosh, Asia B, DO       verapamil (CALAN-SR) CR tablet 180 mg  180 mg Oral Daily Zierle-Ghosh, Asia B, DO   180 mg at 05/05/23 1131     Discharge Medications: Please see discharge summary for a list of discharge medications.  Relevant Imaging Results:  Relevant Lab Results:   Additional Information SSN: [id number]  [place], [place]

## 2023-05-06 NOTE — Care Management Important Message (Signed)
Important Message  Patient Details  Name: [name] MRN: [id number] Date of Birth: 09-13-1941   Medicare Important Message Given:  N/A - LOS <3 / Initial given by admissions     Corey Harold 05/06/2023, 9:49 AM

## 2023-05-06 NOTE — Hospital Course (Signed)
82 y.o. female with medical history significant of COPD, dementia, depression, GERD, hypertension, SVT, and more presents the ED with a chief complaint of positive blood cultures.  Apparently patient had had an aspiration event on the 18th.  She was choking on "phlegm" and having trouble catching her breath.  Daughter at bedside reports that her temp went up to 102.  They had to bump her oxygen from her baseline of 2 L nasal cannula up to 5 L nasal cannula before bringing her into the ER.  She improved in the ER after breathing treatment, and was sent home.  Due to her fever she had blood culture checked in the ER which today grew gram-positive cocci so family was called and asked to bring patient back in.  Family does report the patient has had some generalized weakness and a decrease in appetite.  Patient is pleasantly demented.  She is not able to provide any history.  According to daughter at bedside there has been no particular illness going around her SNF.  She has had no body sores or ulcers.  She had no known viral pneumonia.  She did get her COVID shot.  No other complaints at this time.

## 2023-05-07 LAB — CULTURE, BLOOD (ROUTINE X 2)

## 2023-05-08 LAB — CULTURE, BLOOD (ROUTINE X 2): Special Requests: ADEQUATE

## 2023-05-09 ENCOUNTER — Encounter: Payer: Self-pay | Admitting: Internal Medicine

## 2023-05-09 ENCOUNTER — Non-Acute Institutional Stay (SKILLED_NURSING_FACILITY): Payer: Medicare PPO | Admitting: Internal Medicine

## 2023-05-09 DIAGNOSIS — J9612 Chronic respiratory failure with hypercapnia: Secondary | ICD-10-CM | POA: Diagnosis not present

## 2023-05-09 DIAGNOSIS — D649 Anemia, unspecified: Secondary | ICD-10-CM

## 2023-05-09 DIAGNOSIS — J9611 Chronic respiratory failure with hypoxia: Secondary | ICD-10-CM

## 2023-05-09 DIAGNOSIS — R7881 Bacteremia: Secondary | ICD-10-CM | POA: Diagnosis not present

## 2023-05-09 DIAGNOSIS — N1831 Chronic kidney disease, stage 3a: Secondary | ICD-10-CM

## 2023-05-09 DIAGNOSIS — T17908D Unspecified foreign body in respiratory tract, part unspecified causing other injury, subsequent encounter: Secondary | ICD-10-CM

## 2023-05-09 NOTE — Assessment & Plan Note (Signed)
On exam she has a "silent lungs" of advanced COPD.  She is exhibiting no respiratory distress.  O2 sats ranged from 91% up to 98% on supplemental oxygen.

## 2023-05-09 NOTE — Assessment & Plan Note (Signed)
She is afebrile.  The blood culture was a contaminant.

## 2023-05-09 NOTE — Assessment & Plan Note (Addendum)
Speech therapy has recommended dysphagia 3 thin liquid diet.  She continues to be high risk for recurrent aspiration in the context of advanced dementia.

## 2023-05-09 NOTE — Patient Instructions (Signed)
See assessment and plan under each diagnosis in the problem list and acutely for this visit 

## 2023-05-09 NOTE — Progress Notes (Signed)
NURSING HOME LOCATION:  [facility] ROOM NUMBER:  [id number]  CODE STATUS:  DNR  PCP:  Synthia Innocent NP  This is a nursing facility follow up visit for Nursing Facility readmission within 30 days  Interim medical record and care since last SNF visit was updated with review of diagnostic studies and change in clinical status since last visit were documented.  HPI: She was rehospitalized 6/19 - 05/06/2023, because of recurrent temp up to 102 with positive blood cultures.  The patient had what clinically appeared to be an aspiration event on 6/18 and was sent to the ED.  Titration of nasal oxygen to 5 L was necessary because of hypoxia.  Clinically she stabilized and was returned to the SNF.  She was called back to the ED 6/19 for admission because 1 of 2 blood cultures cultures collected  6/18 grew staph capitis of questionable clinical significance. Initially she received doxycycline which was discontinued when repeat cultures were negative and imaging revealed no acute process. There was a slight rise in her creatinine to 1.11 with a drop in the GFR to 50.  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  Protein/caloric malnutrition was documented with an albumin of 2.8 and total protein of 5.8.  White blood count remained normal during hospitalization.  There was slight progression of anemia with H/H of 9.5/31.7 dropping to final values of 8.8/29.2. Speech therapy recommended dysphagia 3 diet with thin liquids and aspiration precautions.  As of 6/21 she was felt to be stable enough to return to the SNF where she is a permanent resident.  Review of systems: Dementia invalidated responses.  She was essentially nonverbal shaking her head horizontally side to side, indicating negative response to all queries.  She does not realize she was hospitalized.  She was able to give me her daughter's name; but she was unable to tell me where  her daughter works.  Her daughter is a Engineer, civil (consulting) here at this facility. In reference to anemia; staff reports stools been soft and foul-smelling but w/o blood or melena. No bleeding dyscrasias reported by staff.  Constitutional: No fever, significant weight change, fatigue  Eyes: No redness, discharge, pain, vision change ENT/mouth: No nasal congestion,  purulent discharge, earache, change in hearing, sore throat  Cardiovascular: No chest pain, palpitations, paroxysmal nocturnal dyspnea, claudication, edema  Respiratory: No cough, sputum production, hemoptysis, DOE, significant snoring, apnea   Gastrointestinal: No heartburn, dysphagia, abdominal pain, nausea /vomiting, rectal bleeding, melena, change in bowels Genitourinary: No dysuria, hematuria, pyuria, incontinence, nocturia Musculoskeletal: No joint stiffness, joint swelling, weakness, pain Dermatologic: No rash, pruritus, change in appearance of skin Neurologic: No dizziness, headache, syncope, seizures, numbness, tingling Psychiatric: No significant anxiety, depression, insomnia, anorexia Endocrine: No change in hair/skin/nails, excessive thirst, excessive hunger, excessive urination  Hematologic/lymphatic: No significant bruising, lymphadenopathy, abnormal bleeding  Physical exam:  Pertinent or positive findings: She appears suboptimally nourished.  She has a resting tremor of the head.  She is wearing nasal oxygen.  Bilateral ptosis is noted.  There is slight alternating exotropia.  She is edentulous.  Heart rhythm is slightly irregular.  She has a silent lungs of advanced COPD.  Pedal pulses are palpable but decreased.  She has limb atrophy and interosseous wasting.  She is wearing an antiwandering bracelet on the right ankle.  She could not follow commands such as opposing my hand to test strength in the extremities.  General appearance: no acute distress, increased work of breathing is present.   Lymphatic: No lymphadenopathy about the  head, neck, axilla. Eyes: No conjunctival inflammation or lid edema is present. There is no scleral icterus. Ears:  External ear exam shows no significant lesions or deformities.   Nose:  External nasal examination shows no deformity or inflammation. Nasal mucosa are pink and moist without lesions, exudates Neck:  No thyromegaly, masses, tenderness noted.    Heart:  No gallop, murmur, click, rub .  Lungs:  without wheezes, rhonchi, rales, rubs. Abdomen: Bowel sounds are normal. Abdomen is soft and nontender with no organomegaly, hernias, masses. GU: Deferred  Extremities:  No cyanosis, clubbing, edema  Neurologic exam :Balance, Rhomberg, finger to nose testing could not be completed due to clinical state Skin: Warm & dry w/o tenting. No significant lesions or rash.  See summary under each active problem in the Problem List with associated updated therapeutic plan

## 2023-05-09 NOTE — Assessment & Plan Note (Signed)
While hospitalized following probable aspiration event; nadir GFR was 50 indicating CKD stage III but final creatinine is greater than 60 indicating CKD stage II.  Med list reviewed; no indication for change in meds or dosages.

## 2023-05-09 NOTE — Assessment & Plan Note (Addendum)
Slight progression of normochromic, normocytic anemia while hospitalized.  Staff reports no bleeding dyscrasias.  FOB will be performed & CBC rechecked.

## 2023-05-10 ENCOUNTER — Other Ambulatory Visit (HOSPITAL_COMMUNITY)
Admission: RE | Admit: 2023-05-10 | Discharge: 2023-05-10 | Disposition: A | Discharge status: Home / Self Care | Payer: Medicare PPO | Source: Skilled Nursing Facility | Attending: Internal Medicine | Admitting: Internal Medicine

## 2023-05-10 DIAGNOSIS — R2681 Unsteadiness on feet: Secondary | ICD-10-CM | POA: Diagnosis not present

## 2023-05-10 DIAGNOSIS — M6281 Muscle weakness (generalized): Secondary | ICD-10-CM | POA: Diagnosis not present

## 2023-05-10 DIAGNOSIS — Z9181 History of falling: Secondary | ICD-10-CM | POA: Diagnosis not present

## 2023-05-10 DIAGNOSIS — D649 Anemia, unspecified: Secondary | ICD-10-CM | POA: Insufficient documentation

## 2023-05-10 DIAGNOSIS — R498 Other voice and resonance disorders: Secondary | ICD-10-CM | POA: Diagnosis not present

## 2023-05-10 DIAGNOSIS — J9611 Chronic respiratory failure with hypoxia: Secondary | ICD-10-CM | POA: Diagnosis not present

## 2023-05-10 DIAGNOSIS — R279 Unspecified lack of coordination: Secondary | ICD-10-CM | POA: Diagnosis not present

## 2023-05-10 LAB — CBC WITH DIFFERENTIAL/PLATELET
Abs Immature Granulocytes: 0.04 10*3/uL (ref 0.00–0.07)
Basophils Absolute: 0.1 10*3/uL (ref 0.0–0.1)
Basophils Relative: 1 %
Eosinophils Absolute: 0.3 10*3/uL (ref 0.0–0.5)
Eosinophils Relative: 5 %
HCT: 33.9 % — ABNORMAL LOW (ref 36.0–46.0)
Hemoglobin: 10.1 g/dL — ABNORMAL LOW (ref 12.0–15.0)
Immature Granulocytes: 1 %
Lymphocytes Relative: 24 %
Lymphs Abs: 1.4 10*3/uL (ref 0.7–4.0)
MCH: 26 pg (ref 26.0–34.0)
MCHC: 29.8 g/dL — ABNORMAL LOW (ref 30.0–36.0)
MCV: 87.4 fL (ref 80.0–100.0)
Monocytes Absolute: 0.4 10*3/uL (ref 0.1–1.0)
Monocytes Relative: 6 %
Neutro Abs: 3.6 10*3/uL (ref 1.7–7.7)
Neutrophils Relative %: 63 %
Platelets: 327 10*3/uL (ref 150–400)
RBC: 3.88 MIL/uL (ref 3.87–5.11)
RDW: 15.1 % (ref 11.5–15.5)
WBC: 5.8 10*3/uL (ref 4.0–10.5)
nRBC: 0 % (ref 0.0–0.2)

## 2023-05-10 LAB — CULTURE, BLOOD (ROUTINE X 2): Culture: NO GROWTH

## 2023-05-11 DIAGNOSIS — R279 Unspecified lack of coordination: Secondary | ICD-10-CM | POA: Diagnosis not present

## 2023-05-11 DIAGNOSIS — M6281 Muscle weakness (generalized): Secondary | ICD-10-CM | POA: Diagnosis not present

## 2023-05-11 DIAGNOSIS — Z9181 History of falling: Secondary | ICD-10-CM | POA: Diagnosis not present

## 2023-05-11 DIAGNOSIS — R498 Other voice and resonance disorders: Secondary | ICD-10-CM | POA: Diagnosis not present

## 2023-05-11 DIAGNOSIS — R2681 Unsteadiness on feet: Secondary | ICD-10-CM | POA: Diagnosis not present

## 2023-05-11 DIAGNOSIS — J9611 Chronic respiratory failure with hypoxia: Secondary | ICD-10-CM | POA: Diagnosis not present

## 2023-05-12 DIAGNOSIS — M6281 Muscle weakness (generalized): Secondary | ICD-10-CM | POA: Diagnosis not present

## 2023-05-12 DIAGNOSIS — J9611 Chronic respiratory failure with hypoxia: Secondary | ICD-10-CM | POA: Diagnosis not present

## 2023-05-12 DIAGNOSIS — R2681 Unsteadiness on feet: Secondary | ICD-10-CM | POA: Diagnosis not present

## 2023-05-12 DIAGNOSIS — R279 Unspecified lack of coordination: Secondary | ICD-10-CM | POA: Diagnosis not present

## 2023-05-12 DIAGNOSIS — Z9181 History of falling: Secondary | ICD-10-CM | POA: Diagnosis not present

## 2023-05-12 DIAGNOSIS — R498 Other voice and resonance disorders: Secondary | ICD-10-CM | POA: Diagnosis not present

## 2023-05-13 DIAGNOSIS — M6281 Muscle weakness (generalized): Secondary | ICD-10-CM | POA: Diagnosis not present

## 2023-05-13 DIAGNOSIS — Z9181 History of falling: Secondary | ICD-10-CM | POA: Diagnosis not present

## 2023-05-13 DIAGNOSIS — R279 Unspecified lack of coordination: Secondary | ICD-10-CM | POA: Diagnosis not present

## 2023-05-13 DIAGNOSIS — R498 Other voice and resonance disorders: Secondary | ICD-10-CM | POA: Diagnosis not present

## 2023-05-13 DIAGNOSIS — J9611 Chronic respiratory failure with hypoxia: Secondary | ICD-10-CM | POA: Diagnosis not present

## 2023-05-13 DIAGNOSIS — R2681 Unsteadiness on feet: Secondary | ICD-10-CM | POA: Diagnosis not present

## 2023-05-14 DIAGNOSIS — M6281 Muscle weakness (generalized): Secondary | ICD-10-CM | POA: Diagnosis not present

## 2023-05-14 DIAGNOSIS — J9611 Chronic respiratory failure with hypoxia: Secondary | ICD-10-CM | POA: Diagnosis not present

## 2023-05-14 DIAGNOSIS — R498 Other voice and resonance disorders: Secondary | ICD-10-CM | POA: Diagnosis not present

## 2023-05-14 DIAGNOSIS — R2681 Unsteadiness on feet: Secondary | ICD-10-CM | POA: Diagnosis not present

## 2023-05-14 DIAGNOSIS — R279 Unspecified lack of coordination: Secondary | ICD-10-CM | POA: Diagnosis not present

## 2023-05-14 DIAGNOSIS — Z9181 History of falling: Secondary | ICD-10-CM | POA: Diagnosis not present

## 2023-05-16 DIAGNOSIS — R498 Other voice and resonance disorders: Secondary | ICD-10-CM | POA: Diagnosis not present

## 2023-05-16 DIAGNOSIS — R279 Unspecified lack of coordination: Secondary | ICD-10-CM | POA: Diagnosis not present

## 2023-05-16 DIAGNOSIS — Z9181 History of falling: Secondary | ICD-10-CM | POA: Diagnosis not present

## 2023-05-16 DIAGNOSIS — J9611 Chronic respiratory failure with hypoxia: Secondary | ICD-10-CM | POA: Diagnosis not present

## 2023-05-16 DIAGNOSIS — R2681 Unsteadiness on feet: Secondary | ICD-10-CM | POA: Diagnosis not present

## 2023-05-16 DIAGNOSIS — M6281 Muscle weakness (generalized): Secondary | ICD-10-CM | POA: Diagnosis not present

## 2023-05-17 DIAGNOSIS — M6281 Muscle weakness (generalized): Secondary | ICD-10-CM | POA: Diagnosis not present

## 2023-05-17 DIAGNOSIS — J9611 Chronic respiratory failure with hypoxia: Secondary | ICD-10-CM | POA: Diagnosis not present

## 2023-05-17 DIAGNOSIS — R279 Unspecified lack of coordination: Secondary | ICD-10-CM | POA: Diagnosis not present

## 2023-05-17 DIAGNOSIS — R498 Other voice and resonance disorders: Secondary | ICD-10-CM | POA: Diagnosis not present

## 2023-05-17 DIAGNOSIS — S9001XA Contusion of right ankle, initial encounter: Secondary | ICD-10-CM | POA: Diagnosis not present

## 2023-05-17 DIAGNOSIS — R2681 Unsteadiness on feet: Secondary | ICD-10-CM | POA: Diagnosis not present

## 2023-05-17 DIAGNOSIS — Z9181 History of falling: Secondary | ICD-10-CM | POA: Diagnosis not present

## 2023-05-17 DIAGNOSIS — S9031XA Contusion of right foot, initial encounter: Secondary | ICD-10-CM | POA: Diagnosis not present

## 2023-05-18 ENCOUNTER — Ambulatory Visit (HOSPITAL_COMMUNITY)
Admission: RE | Admit: 2023-05-18 | Discharge: 2023-05-18 | Disposition: A | Discharge status: Home / Self Care | Payer: Medicare PPO | Source: Ambulatory Visit | Attending: Adult Health | Admitting: Adult Health

## 2023-05-18 ENCOUNTER — Other Ambulatory Visit (HOSPITAL_COMMUNITY): Payer: Self-pay | Admitting: Adult Health

## 2023-05-18 ENCOUNTER — Encounter: Payer: Self-pay | Admitting: Adult Health

## 2023-05-18 ENCOUNTER — Other Ambulatory Visit (HOSPITAL_COMMUNITY)
Admission: RE | Admit: 2023-05-18 | Discharge: 2023-05-18 | Disposition: A | Discharge status: Home / Self Care | Payer: Medicare PPO | Source: Ambulatory Visit | Attending: Adult Health | Admitting: Adult Health

## 2023-05-18 ENCOUNTER — Non-Acute Institutional Stay (SKILLED_NURSING_FACILITY): Payer: Medicare PPO | Admitting: Adult Health

## 2023-05-18 DIAGNOSIS — R498 Other voice and resonance disorders: Secondary | ICD-10-CM | POA: Diagnosis not present

## 2023-05-18 DIAGNOSIS — R509 Fever, unspecified: Secondary | ICD-10-CM | POA: Diagnosis not present

## 2023-05-18 DIAGNOSIS — J449 Chronic obstructive pulmonary disease, unspecified: Secondary | ICD-10-CM

## 2023-05-18 DIAGNOSIS — I700 Atherosclerosis of aorta: Secondary | ICD-10-CM | POA: Diagnosis not present

## 2023-05-18 DIAGNOSIS — J439 Emphysema, unspecified: Secondary | ICD-10-CM | POA: Insufficient documentation

## 2023-05-18 DIAGNOSIS — I2510 Atherosclerotic heart disease of native coronary artery without angina pectoris: Secondary | ICD-10-CM | POA: Insufficient documentation

## 2023-05-18 DIAGNOSIS — M6281 Muscle weakness (generalized): Secondary | ICD-10-CM | POA: Diagnosis not present

## 2023-05-18 DIAGNOSIS — Z9181 History of falling: Secondary | ICD-10-CM | POA: Diagnosis not present

## 2023-05-18 DIAGNOSIS — R279 Unspecified lack of coordination: Secondary | ICD-10-CM | POA: Diagnosis not present

## 2023-05-18 DIAGNOSIS — J432 Centrilobular emphysema: Secondary | ICD-10-CM | POA: Diagnosis not present

## 2023-05-18 DIAGNOSIS — R2681 Unsteadiness on feet: Secondary | ICD-10-CM | POA: Diagnosis not present

## 2023-05-18 DIAGNOSIS — J9611 Chronic respiratory failure with hypoxia: Secondary | ICD-10-CM | POA: Diagnosis not present

## 2023-05-18 DIAGNOSIS — R935 Abnormal findings on diagnostic imaging of other abdominal regions, including retroperitoneum: Secondary | ICD-10-CM | POA: Diagnosis not present

## 2023-05-18 LAB — CBC WITH DIFFERENTIAL/PLATELET
Abs Immature Granulocytes: 0.11 10*3/uL — ABNORMAL HIGH (ref 0.00–0.07)
Basophils Absolute: 0 10*3/uL (ref 0.0–0.1)
Basophils Relative: 0 %
Eosinophils Absolute: 0 10*3/uL (ref 0.0–0.5)
Eosinophils Relative: 0 %
HCT: 31.3 % — ABNORMAL LOW (ref 36.0–46.0)
Hemoglobin: 9 g/dL — ABNORMAL LOW (ref 12.0–15.0)
Immature Granulocytes: 1 %
Lymphocytes Relative: 3 %
Lymphs Abs: 0.5 10*3/uL — ABNORMAL LOW (ref 0.7–4.0)
MCH: 25.1 pg — ABNORMAL LOW (ref 26.0–34.0)
MCHC: 28.8 g/dL — ABNORMAL LOW (ref 30.0–36.0)
MCV: 87.4 fL (ref 80.0–100.0)
Monocytes Absolute: 1 10*3/uL (ref 0.1–1.0)
Monocytes Relative: 6 %
Neutro Abs: 14.9 10*3/uL — ABNORMAL HIGH (ref 1.7–7.7)
Neutrophils Relative %: 90 %
Platelets: 322 10*3/uL (ref 150–400)
RBC: 3.58 MIL/uL — ABNORMAL LOW (ref 3.87–5.11)
RDW: 15.6 % — ABNORMAL HIGH (ref 11.5–15.5)
WBC: 16.5 10*3/uL — ABNORMAL HIGH (ref 4.0–10.5)
nRBC: 0.2 % (ref 0.0–0.2)

## 2023-05-18 LAB — COMPREHENSIVE METABOLIC PANEL
ALT: 41 U/L (ref 0–44)
AST: 67 U/L — ABNORMAL HIGH (ref 15–41)
Albumin: 3 g/dL — ABNORMAL LOW (ref 3.5–5.0)
Alkaline Phosphatase: 90 U/L (ref 38–126)
Anion gap: 8 (ref 5–15)
BUN: 43 mg/dL — ABNORMAL HIGH (ref 8–23)
CO2: 27 mmol/L (ref 22–32)
Calcium: 8.7 mg/dL — ABNORMAL LOW (ref 8.9–10.3)
Chloride: 109 mmol/L (ref 98–111)
Creatinine, Ser: 1.33 mg/dL — ABNORMAL HIGH (ref 0.44–1.00)
GFR, Estimated: 40 mL/min — ABNORMAL LOW (ref >60)
Glucose, Bld: 121 mg/dL — ABNORMAL HIGH (ref 70–99)
Potassium: 4.2 mmol/L (ref 3.5–5.1)
Sodium: 144 mmol/L (ref 135–145)
Total Bilirubin: 0.6 mg/dL (ref 0.3–1.2)
Total Protein: 6.6 g/dL (ref 6.5–8.1)

## 2023-05-18 NOTE — Progress Notes (Signed)
Location:  [facility] Room Number: 149W Place of Service:  SNF (31)   CODE STATUS: DNR  Allergies  Allergen Reactions   Fenofibrate Nausea And Vomiting   Pravastatin Sodium Nausea And Vomiting   Sulfonamide Derivatives Other (See Comments)    unknown   Ciprofloxacin Rash    Chief Complaint  Patient presents with   Acute Visit    Fever     HPI:  She has had a change in status. She has a fever of 102.2  she is more short of breath. She has had fevers in the recent past; went to the ED without finding actual cause of fever. Her gums are without open areas she wears dentures. Her lungs have a few wheezes present. There are no reports of foul urine present. No reports of nausea or vomiting.   Past Medical History:  Diagnosis Date   Abnormality of gait 06/06/2014   Allergic rhinitis    Anemia    Ankle fracture, right    Anxiety    Arm fracture, left    Arthritis    abnormal gait    COPD (chronic obstructive pulmonary disease) (HCC)    chronic CO2 retention, decreased DLCO    Cystic acne    adult    Degenerative disc disease, cervical    syrinx C3-7   Degenerative disc disease, lumbar    L5 nerve impingement    Dementia (HCC)    Depression    DNR (do not resuscitate)    Fracture of right proximal fibula 07/13/18 09/05/2018   GERD (gastroesophageal reflux disease)    Heme positive stool 10/10/2017   History of colon surgery    right hemicolectomy for high-grade adenomas in right colon 2013   Hyperglycemia    Hypertension    Low back pain    Mediastinal lymphadenopathy 1/9   resolving    Memory difficulty 09/20/2014   mild dementia   Onychomycosis    OSA on CPAP    Sleep apnea    stop bang score 6    Past Surgical History:  Procedure Laterality Date   ABDOMINAL HYSTERECTOMY  1976   secondary to bleeding    APPENDECTOMY     COLON RESECTION  08/18/2012   Procedure: HAND ASSISTED LAPAROSCOPIC COLON RESECTION;  Surgeon: Dalia Heading, MD;   Location: [facility];  Service: General;  Laterality: N/A;   COLONOSCOPY  07/20/2012   Dr. Jena Gauss: multiple colonic polyps with large polyps on right side, s/p saline-assisted debulking piecemeal polypectomy and ablation. Not all removed. Path with tubulovillous adenomas, high grade dysplasia.    COLONOSCOPY N/A 03/09/2013   [id number] polyps-tubular adenomas. S/p right hemicolectomy. next tcs 02/2018   epidural steroids     ESOPHAGOGASTRODUODENOSCOPY (EGD) WITH ESOPHAGEAL DILATION N/A 02/06/2014   Procedure: ESOPHAGOGASTRODUODENOSCOPY (EGD) WITH ESOPHAGEAL DILATION;  Surgeon: Corbin Ade, MD;  Location: [facility];  Service: Endoscopy;  Laterality: N/A;  9:30   OOPHORECTOMY  1976   ORIF ANKLE FRACTURE  01/18/2012   Procedure: OPEN REDUCTION INTERNAL FIXATION (ORIF) ANKLE FRACTURE;  Surgeon: Fuller Canada, MD;  Location: [facility];  Service: Orthopedics;  Laterality: Left;   ORIF ANKLE FRACTURE Right 01/13/2016   Procedure: OPEN REDUCTION INTERNAL FIXATION (ORIF) ANKLE FRACTURE;  Surgeon: Vickki Hearing, MD;  Location: [facility];  Service: Orthopedics;  Laterality: Right;    Social History   Socioeconomic History   Marital status: Widowed    Spouse name: Not on file   Number of  children: 4   Years of education: college 1   Highest education level: Not on file  Occupational History   Occupation: employed in the tobacco industry     Employer: RETIRED   Occupation: retired  Tobacco Use   Smoking status: Former    Packs/day: 1.50    Years: 40.00    Additional pack years: 0.00    Total pack years: 60.00    Types: Cigarettes    Quit date: 06/21/2006    Years since quitting: 16.9   Smokeless tobacco: Never  Vaping Use   Vaping Use: Never used  Substance and Sexual Activity   Alcohol use: No   Drug use: No   Sexual activity: Not Currently  Other Topics Concern   Not on file  Social History Narrative   Long term resident of [facility]    Social Determinants of Health   Financial Resource  Strain: Low Risk  (12/02/2020)   Overall Financial Resource Strain (CARDIA)    Difficulty of Paying Living Expenses: Not hard at all  Food Insecurity: No Food Insecurity (05/05/2023)   Hunger Vital Sign    Worried About Running Out of Food in the Last Year: Never true    Ran Out of Food in the Last Year: Never true  Transportation Needs: No Transportation Needs (05/05/2023)   PRAPARE - Administrator, Civil Service (Medical): No    Lack of Transportation (Non-Medical): No  Physical Activity: Inactive (12/02/2020)   Exercise Vital Sign    Days of Exercise per Week: 0 days    Minutes of Exercise per Session: 0 min  Stress: No Stress Concern Present (12/02/2020)   [employer] of Occupational Health - Occupational Stress Questionnaire    Feeling of Stress : Not at all  Social Connections: Socially Isolated (12/02/2020)   Social Connection and Isolation Panel [NHANES]    Frequency of Communication with Friends and Family: Three times a week    Frequency of Social Gatherings with Friends and Family: Once a week    Attends Religious Services: Never    Database administrator or Organizations: No    Attends Banker Meetings: Never    Marital Status: Widowed  Intimate Partner Violence: Not At Risk (05/05/2023)   Humiliation, Afraid, Rape, and Kick questionnaire    Fear of Current or Ex-Partner: No    Emotionally Abused: No    Physically Abused: No    Sexually Abused: No   Family History  Problem Relation Age of Onset   Stomach cancer Father    Cancer Father    Cancer Mother        brain    Breast cancer Mother    Arthritis Other       VITAL SIGNS BP 119/68   Pulse (!) 109   Temp (!) 102.2 F (39 C)   Resp (!) 22   Ht 6\' 1"  (1.854 m)   Wt 157 lb 12.8 oz (71.6 kg)   SpO2 90%   BMI 20.82 kg/m   Outpatient Encounter Medications as of 05/18/2023  Medication Sig   calcium-vitamin D (OSCAL WITH D) 500-5 MG-MCG tablet Take 1 tablet by mouth daily at 12  noon.   cyanocobalamin (VITAMIN B12) 1000 MCG tablet Take 1,000 mcg by mouth daily.   donepezil (ARICEPT) 10 MG tablet TAKE 1 TABLET AT BEDTIME   loratadine (CLARITIN) 10 MG tablet Take 10 mg by mouth daily.   mirabegron ER (MYRBETRIQ) 50 MG TB24 tablet Take 50  mg by mouth daily.   NON FORMULARY Diet: Regular diet with chopped meats and gravy to all meats.   omeprazole (PRILOSEC) 40 MG capsule TAKE 1 CAPSULE EVERY DAY   OXYGEN Inhale 2 L into the lungs continuous.   potassium chloride SA (KLOR-CON M) 20 MEQ tablet Take 20 mEq by mouth daily.   STRIVERDI RESPIMAT 2.5 MCG/ACT AERS INHALE 2 PUFFS ONE TIME DAILY AT THE SAME TIME   verapamil (CALAN-SR) 180 MG CR tablet Take 180 mg by mouth daily.   [DISCONTINUED] magnesium hydroxide (MILK OF MAGNESIA) 400 MG/5ML suspension Take 30 mLs by mouth daily as needed for mild constipation.   No facility-administered encounter medications on file as of 05/18/2023.     SIGNIFICANT DIAGNOSTIC EXAMS  LABS REVIEWED PREVIOUS  05-21-22: wbc 4.8; hgb 11.; hct 37.5; mcv 87.2 plt 229; glucose 74; bun 26; creat 1.05 ;k+ 3.8; na++ 140; ca 9.2; gfr 54  06-10-22: vitamin D 39.61 10-20-22: wbc 13.6; hgb 9.7; hct 32.0; mcv 86.3; plt 205; glucose 09; bun 25; creat 1.20; k+ 3.7; na++ 139; ca 8.5; gfr 45; chol 110; ldl 63; trig 59; hdl 35  10-28-22: chol 110; ldl 63; trig 59; hdl 35 02-03-23; wbc 3.4; hgb 9.3; hct 30.9; mcv 86.1 plt 200; glucose 72; bun 24; creat 0.81; k+ 3.8; na++ 142; ca 8.7; gfr >60    TODAY  03-10-23: wbc 4.4; hgb 9.9; hct 32.7; mcv 85.4 plt 210; glucose 80; bun 22; creat 0.79 k+ 4.0; na++ 139; ca 9.1; gfr >60; protein 6.1 albumin 3.4; tsh 0.918; vitamin B 12: 250; vitamin D 42.92;  05-03-23: wbc 7.3; hgb 9.5; hct 31.7; mcv 86.1 plt 240; glucose 101; bun 16; creat 0.84; k+ 3.7; na++ 139; ca 8.9; gfr >60; protein 6.4 albumin 3.3 BNP 51   Review of Systems  Reason unable to perform ROS: unable to fully participate.    Physical Exam Constitutional:       General: She is in acute distress.     Appearance: She is well-developed. She is ill-appearing. She is not diaphoretic.  Neck:     Thyroid: No thyromegaly.  Cardiovascular:     Rate and Rhythm: Regular rhythm. Tachycardia present.     Pulses: Normal pulses.     Heart sounds: Normal heart sounds.  Pulmonary:     Effort: Pulmonary effort is normal. No respiratory distress.     Breath sounds: Wheezing present.  Abdominal:     General: Bowel sounds are normal. There is no distension.     Palpations: Abdomen is soft.     Tenderness: There is no abdominal tenderness.  Musculoskeletal:        General: Normal range of motion.     Cervical back: Neck supple.     Right lower leg: No edema.     Left lower leg: No edema.  Lymphadenopathy:     Cervical: No cervical adenopathy.  Skin:    General: Skin is warm and dry.     Comments: Right foot bruised and slightly swollen.   Neurological:     Mental Status: She is alert. Mental status is at baseline.  Psychiatric:        Mood and Affect: Mood normal.       ASSESSMENT/ PLAN:  TODAY  COPD with hypoxia FUO (fever unknown origin)  Will begin vancomycin IV Will insert picc line; get blood cultures X 2 sites; lactic acid; CBC with diff; CMP; urine culture Will get ct of chest and abdomen to look  for sources of infection Will get 2-d echo: to look for vegetation    Synthia Innocent NP [facility]  call [phone]

## 2023-05-19 ENCOUNTER — Other Ambulatory Visit (HOSPITAL_COMMUNITY)
Admission: RE | Admit: 2023-05-19 | Discharge: 2023-05-19 | Disposition: A | Discharge status: Home / Self Care | Payer: Medicare PPO | Source: Skilled Nursing Facility | Attending: Adult Health | Admitting: Adult Health

## 2023-05-19 DIAGNOSIS — B999 Unspecified infectious disease: Secondary | ICD-10-CM | POA: Insufficient documentation

## 2023-05-19 DIAGNOSIS — R498 Other voice and resonance disorders: Secondary | ICD-10-CM | POA: Diagnosis not present

## 2023-05-19 DIAGNOSIS — R279 Unspecified lack of coordination: Secondary | ICD-10-CM | POA: Diagnosis not present

## 2023-05-19 DIAGNOSIS — R2681 Unsteadiness on feet: Secondary | ICD-10-CM | POA: Diagnosis not present

## 2023-05-19 DIAGNOSIS — Z1622 Resistance to vancomycin related antibiotics: Secondary | ICD-10-CM | POA: Diagnosis not present

## 2023-05-19 DIAGNOSIS — M6281 Muscle weakness (generalized): Secondary | ICD-10-CM | POA: Diagnosis not present

## 2023-05-19 DIAGNOSIS — Z9181 History of falling: Secondary | ICD-10-CM | POA: Diagnosis not present

## 2023-05-19 DIAGNOSIS — Z802 Family history of malignant neoplasm of other respiratory and intrathoracic organs: Secondary | ICD-10-CM | POA: Insufficient documentation

## 2023-05-19 DIAGNOSIS — J9611 Chronic respiratory failure with hypoxia: Secondary | ICD-10-CM | POA: Diagnosis not present

## 2023-05-19 LAB — VANCOMYCIN, PEAK: Vancomycin Pk: 24 ug/mL — ABNORMAL LOW (ref 30–40)

## 2023-05-20 ENCOUNTER — Other Ambulatory Visit (HOSPITAL_COMMUNITY): Payer: Self-pay | Admitting: Adult Health

## 2023-05-20 ENCOUNTER — Encounter: Payer: Self-pay | Admitting: Adult Health

## 2023-05-20 ENCOUNTER — Encounter (HOSPITAL_COMMUNITY): Payer: Self-pay

## 2023-05-20 ENCOUNTER — Other Ambulatory Visit (HOSPITAL_COMMUNITY)
Admission: RE | Admit: 2023-05-20 | Discharge: 2023-05-20 | Disposition: A | Discharge status: Home / Self Care | Payer: Medicare PPO | Source: Ambulatory Visit | Attending: Adult Health | Admitting: Adult Health

## 2023-05-20 ENCOUNTER — Non-Acute Institutional Stay (SKILLED_NURSING_FACILITY): Payer: Medicare PPO | Admitting: Adult Health

## 2023-05-20 DIAGNOSIS — I330 Acute and subacute infective endocarditis: Secondary | ICD-10-CM | POA: Diagnosis not present

## 2023-05-20 DIAGNOSIS — Z9181 History of falling: Secondary | ICD-10-CM | POA: Diagnosis not present

## 2023-05-20 DIAGNOSIS — J9611 Chronic respiratory failure with hypoxia: Secondary | ICD-10-CM | POA: Diagnosis not present

## 2023-05-20 DIAGNOSIS — J449 Chronic obstructive pulmonary disease, unspecified: Secondary | ICD-10-CM

## 2023-05-20 DIAGNOSIS — R498 Other voice and resonance disorders: Secondary | ICD-10-CM | POA: Diagnosis not present

## 2023-05-20 DIAGNOSIS — R509 Fever, unspecified: Secondary | ICD-10-CM

## 2023-05-20 DIAGNOSIS — M25774 Osteophyte, right foot: Secondary | ICD-10-CM | POA: Diagnosis not present

## 2023-05-20 DIAGNOSIS — M19071 Primary osteoarthritis, right ankle and foot: Secondary | ICD-10-CM | POA: Diagnosis not present

## 2023-05-20 DIAGNOSIS — R279 Unspecified lack of coordination: Secondary | ICD-10-CM | POA: Diagnosis not present

## 2023-05-20 DIAGNOSIS — K2289 Other specified disease of esophagus: Secondary | ICD-10-CM | POA: Diagnosis not present

## 2023-05-20 DIAGNOSIS — Z1622 Resistance to vancomycin related antibiotics: Secondary | ICD-10-CM

## 2023-05-20 DIAGNOSIS — R2681 Unsteadiness on feet: Secondary | ICD-10-CM | POA: Diagnosis not present

## 2023-05-20 DIAGNOSIS — S82831D Other fracture of upper and lower end of right fibula, subsequent encounter for closed fracture with routine healing: Secondary | ICD-10-CM | POA: Diagnosis not present

## 2023-05-20 DIAGNOSIS — M6281 Muscle weakness (generalized): Secondary | ICD-10-CM | POA: Diagnosis not present

## 2023-05-20 DIAGNOSIS — S82301D Unspecified fracture of lower end of right tibia, subsequent encounter for closed fracture with routine healing: Secondary | ICD-10-CM | POA: Diagnosis not present

## 2023-05-20 LAB — CBC WITH DIFFERENTIAL/PLATELET
Abs Immature Granulocytes: 0.16 10*3/uL — ABNORMAL HIGH (ref 0.00–0.07)
Basophils Absolute: 0.1 10*3/uL (ref 0.0–0.1)
Basophils Relative: 0 %
Eosinophils Absolute: 0 10*3/uL (ref 0.0–0.5)
Eosinophils Relative: 0 %
HCT: 28.6 % — ABNORMAL LOW (ref 36.0–46.0)
Hemoglobin: 8.3 g/dL — ABNORMAL LOW (ref 12.0–15.0)
Immature Granulocytes: 1 %
Lymphocytes Relative: 5 %
Lymphs Abs: 0.8 10*3/uL (ref 0.7–4.0)
MCH: 25.1 pg — ABNORMAL LOW (ref 26.0–34.0)
MCHC: 29 g/dL — ABNORMAL LOW (ref 30.0–36.0)
MCV: 86.4 fL (ref 80.0–100.0)
Monocytes Absolute: 0.6 10*3/uL (ref 0.1–1.0)
Monocytes Relative: 4 %
Neutro Abs: 15.8 10*3/uL — ABNORMAL HIGH (ref 1.7–7.7)
Neutrophils Relative %: 90 %
Platelets: 346 10*3/uL (ref 150–400)
RBC: 3.31 MIL/uL — ABNORMAL LOW (ref 3.87–5.11)
RDW: 15.9 % — ABNORMAL HIGH (ref 11.5–15.5)
WBC: 17.5 10*3/uL — ABNORMAL HIGH (ref 4.0–10.5)
nRBC: 0.3 % — ABNORMAL HIGH (ref 0.0–0.2)

## 2023-05-20 LAB — URINE CULTURE

## 2023-05-20 LAB — LACTIC ACID, PLASMA: Lactic Acid, Venous: 1.6 mmol/L (ref 0.5–1.9)

## 2023-05-20 LAB — VANCOMYCIN, TROUGH: Vancomycin Tr: 12 ug/mL — ABNORMAL LOW (ref 15–20)

## 2023-05-20 NOTE — Progress Notes (Signed)
Location:  [facility] Room Number: [id number] Place of Service:  SNF (31)   CODE STATUS: dnr   Allergies  Allergen Reactions   Fenofibrate Nausea And Vomiting   Pravastatin Sodium Nausea And Vomiting   Sulfonamide Derivatives Other (See Comments)    unknown   Ciprofloxacin Rash    Chief Complaint  Patient presents with   Acute Visit    Follow up test results     HPI:  She had a fever of 102.2.  The workup included ct of chest and abdomen; blood work. Unfortunately the blood cultures and lact acid labs were misplaced. They have been repeated today. She was started yesterday on IV vanc with a 1750 mg loading dose and 1250 mg every 12 hours. She has just completed a dose at 1430 hours. We have had difficulty getting the 2-d echo completed to look for vegetation.  The mobile x-ray is going to perform the 2-d echo today.  She is having difficulty maintaining her 02 sats. She is not coughing; no shortness of breath; no nausea or vomiting. She has had 2 loose stools today.  Her right foot got dragged under her wheelchair on 05-18-23. The initial x-ray did not demonstrate any fracture. Today the bruising and swelling are worse; and is cool to touch. The pedal pulse is present but faint. Her x-ray is being performed again by mobile x-ray.    Past Medical History:  Diagnosis Date   Abnormality of gait 06/06/2014   Allergic rhinitis    Anemia    Ankle fracture, right    Anxiety    Arm fracture, left    Arthritis    abnormal gait    COPD (chronic obstructive pulmonary disease) (HCC)    chronic CO2 retention, decreased DLCO    Cystic acne    adult    Degenerative disc disease, cervical    syrinx C3-7   Degenerative disc disease, lumbar    L5 nerve impingement    Dementia (HCC)    Depression    DNR (do not resuscitate)    Fracture of right proximal fibula 07/13/18 09/05/2018   GERD (gastroesophageal reflux disease)    Heme positive stool 10/10/2017   History of  colon surgery    right hemicolectomy for high-grade adenomas in right colon 2013   Hyperglycemia    Hypertension    Low back pain    Mediastinal lymphadenopathy 1/9   resolving    Memory difficulty 09/20/2014   mild dementia   Onychomycosis    OSA on CPAP    Sleep apnea    stop bang score 6    Past Surgical History:  Procedure Laterality Date   ABDOMINAL HYSTERECTOMY  1976   secondary to bleeding    APPENDECTOMY     COLON RESECTION  08/18/2012   Procedure: HAND ASSISTED LAPAROSCOPIC COLON RESECTION;  Surgeon: Dalia Heading, MD;  Location: [facility];  Service: General;  Laterality: N/A;   COLONOSCOPY  07/20/2012   Dr. Jena Gauss: multiple colonic polyps with large polyps on right side, s/p saline-assisted debulking piecemeal polypectomy and ablation. Not all removed. Path with tubulovillous adenomas, high grade dysplasia.    COLONOSCOPY N/A 03/09/2013   [id number] polyps-tubular adenomas. S/p right hemicolectomy. next tcs 02/2018   epidural steroids     ESOPHAGOGASTRODUODENOSCOPY (EGD) WITH ESOPHAGEAL DILATION N/A 02/06/2014   Procedure: ESOPHAGOGASTRODUODENOSCOPY (EGD) WITH ESOPHAGEAL DILATION;  Surgeon: Corbin Ade, MD;  Location: [facility];  Service: Endoscopy;  Laterality: N/A;  9:30   OOPHORECTOMY  1976   ORIF ANKLE FRACTURE  01/18/2012   Procedure: OPEN REDUCTION INTERNAL FIXATION (ORIF) ANKLE FRACTURE;  Surgeon: Fuller Canada, MD;  Location: [facility];  Service: Orthopedics;  Laterality: Left;   ORIF ANKLE FRACTURE Right 01/13/2016   Procedure: OPEN REDUCTION INTERNAL FIXATION (ORIF) ANKLE FRACTURE;  Surgeon: Vickki Hearing, MD;  Location: [facility];  Service: Orthopedics;  Laterality: Right;    Social History   Socioeconomic History   Marital status: Widowed    Spouse name: Not on file   Number of children: 4   Years of education: college 1   Highest education level: Not on file  Occupational History   Occupation: employed in the tobacco industry     Employer: RETIRED    Occupation: retired  Tobacco Use   Smoking status: Former    Packs/day: 1.50    Years: 40.00    Additional pack years: 0.00    Total pack years: 60.00    Types: Cigarettes    Quit date: 06/21/2006    Years since quitting: 16.9   Smokeless tobacco: Never  Vaping Use   Vaping Use: Never used  Substance and Sexual Activity   Alcohol use: No   Drug use: No   Sexual activity: Not Currently  Other Topics Concern   Not on file  Social History Narrative   Long term resident of [facility]    Social Determinants of Health   Financial Resource Strain: Low Risk  (12/02/2020)   Overall Financial Resource Strain (CARDIA)    Difficulty of Paying Living Expenses: Not hard at all  Food Insecurity: No Food Insecurity (05/05/2023)   Hunger Vital Sign    Worried About Running Out of Food in the Last Year: Never true    Ran Out of Food in the Last Year: Never true  Transportation Needs: No Transportation Needs (05/05/2023)   PRAPARE - Administrator, Civil Service (Medical): No    Lack of Transportation (Non-Medical): No  Physical Activity: Inactive (12/02/2020)   Exercise Vital Sign    Days of Exercise per Week: 0 days    Minutes of Exercise per Session: 0 min  Stress: No Stress Concern Present (12/02/2020)   [employer] of Occupational Health - Occupational Stress Questionnaire    Feeling of Stress : Not at all  Social Connections: Socially Isolated (12/02/2020)   Social Connection and Isolation Panel [NHANES]    Frequency of Communication with Friends and Family: Three times a week    Frequency of Social Gatherings with Friends and Family: Once a week    Attends Religious Services: Never    Database administrator or Organizations: No    Attends Banker Meetings: Never    Marital Status: Widowed  Intimate Partner Violence: Not At Risk (05/05/2023)   Humiliation, Afraid, Rape, and Kick questionnaire    Fear of Current or Ex-Partner: No    Emotionally Abused: No     Physically Abused: No    Sexually Abused: No   Family History  Problem Relation Age of Onset   Stomach cancer Father    Cancer Father    Cancer Mother        brain    Breast cancer Mother    Arthritis Other       VITAL SIGNS BP 130/79   Pulse 96   Temp 98.8 F (37.1 C)   Resp 19   Ht 6\' 1"  (1.854 m)   Wt 157  lb 12.8 oz (71.6 kg)   SpO2 91%   BMI 20.82 kg/m   Outpatient Encounter Medications as of 05/20/2023  Medication Sig   vancomycin IVPB Inject 1,250 mg into the vein every 12 (twelve) hours.   calcium-vitamin D (OSCAL WITH D) 500-5 MG-MCG tablet Take 1 tablet by mouth daily at 12 noon.   cyanocobalamin (VITAMIN B12) 1000 MCG tablet Take 1,000 mcg by mouth daily.   donepezil (ARICEPT) 10 MG tablet TAKE 1 TABLET AT BEDTIME   loratadine (CLARITIN) 10 MG tablet Take 10 mg by mouth daily.   mirabegron ER (MYRBETRIQ) 50 MG TB24 tablet Take 50 mg by mouth daily.   NON FORMULARY Diet: Regular diet with chopped meats and gravy to all meats.   omeprazole (PRILOSEC) 40 MG capsule TAKE 1 CAPSULE EVERY DAY   OXYGEN Inhale 2 L into the lungs continuous.   potassium chloride SA (KLOR-CON M) 20 MEQ tablet Take 20 mEq by mouth daily.   STRIVERDI RESPIMAT 2.5 MCG/ACT AERS INHALE 2 PUFFS ONE TIME DAILY AT THE SAME TIME   verapamil (CALAN-SR) 180 MG CR tablet Take 180 mg by mouth daily.   No facility-administered encounter medications on file as of 05/20/2023.     SIGNIFICANT DIAGNOSTIC EXAMS  EXAM  05-18-23: ct of chest abdomen and pelvis:  1. No acute findings in the chest or abdomen. 2. Patulous esophagus contains layering ingested material. 3. Aortic Atherosclerosis  and Emphysema. Coronary artery calcifications. Assessment for potential risk factor modification, dietary therapy or pharmacologic therapy may be warranted, if clinically indicated.    LABS REVIEWED PREVIOUS  05-21-22: wbc 4.8; hgb 11.; hct 37.5; mcv 87.2 plt 229; glucose 74; bun 26; creat 1.05 ;k+ 3.8; na++ 140; ca  9.2; gfr 54  06-10-22: vitamin D 39.61 10-20-22: wbc 13.6; hgb 9.7; hct 32.0; mcv 86.3; plt 205; glucose 09; bun 25; creat 1.20; k+ 3.7; na++ 139; ca 8.5; gfr 45; chol 110; ldl 63; trig 59; hdl 35  10-28-22: chol 110; ldl 63; trig 59; hdl 35 02-03-23; wbc 3.4; hgb 9.3; hct 30.9; mcv 86.1 plt 200; glucose 72; bun 24; creat 0.81; k+ 3.8; na++ 142; ca 8.7; gfr >60    TODAY  03-10-23: wbc 4.4; hgb 9.9; hct 32.7; mcv 85.4 plt 210; glucose 80; bun 22; creat 0.79 k+ 4.0; na++ 139; ca 9.1; gfr >60; protein 6.1 albumin 3.4; tsh 0.918; vitamin B 12: 250; vitamin D 42.92;  05-03-23: wbc 7.3; hgb 9.5; hct 31.7; mcv 86.1 plt 240; glucose 101; bun 16; creat 0.84; k+ 3.7; na++ 139; ca 8.9; gfr >60; protein 6.4 albumin 3.3 BNP 51  05-05-23: wbc 8.7; hgb 9.6; hct 32.0; mcv 86.7 plt 253; glucose 106; bun 22; creat 1.11; k+ 4.2; na++ 141; ca 8.9; gfr 50; protein 6.3 albumin 3.0; mag 1.6 blood culture: no growth 05-10-23: wbc 5.8; hgb 10.1; hct 33.9; mcv 87.4 plt 327 05-18-23: wbc 16.5; hgb 9.0; hct 31.3; mcv 87.4 plt 322; glucose 121; bun 43; creat 1.33; k+ 4.2; na++ 144; ca 8.7; gfr 50 protein 6.6 albumin 3.0; ast 67 urine culture: multiple 05-20-23: wbc 17.5; hgb 8.3; hct 28.6; mcv 86.4 plt 346   Review of Systems  Reason unable to perform ROS: unable to fully participate.    Physical Exam Constitutional:      General: She is not in acute distress.    Appearance: She is well-developed. She is not diaphoretic.  Neck:     Thyroid: No thyromegaly.  Cardiovascular:     Rate  and Rhythm: Normal rate and regular rhythm.     Heart sounds: Normal heart sounds.  Pulmonary:     Effort: Pulmonary effort is normal. No respiratory distress.     Breath sounds: Wheezing present.  Abdominal:     General: Bowel sounds are normal. There is no distension.     Palpations: Abdomen is soft.     Tenderness: There is no abdominal tenderness.  Musculoskeletal:        General: Normal range of motion.     Cervical back: Neck supple.      Right lower leg: No edema.     Left lower leg: No edema.  Lymphadenopathy:     Cervical: No cervical adenopathy.  Skin:    General: Skin is warm and dry.     Comments: Right foot bruised swollen and cool to touch.   Neurological:     Mental Status: She is alert. Mental status is at baseline.  Psychiatric:        Mood and Affect: Mood normal.       ASSESSMENT/ PLAN:  TODAY  COPD with hypoxia FUO (fever unknown origin) Patulous of esophagus  Will setup GI consult 2-d echo being performed today Right foot swelling: repeat x-ray  has been performed; awaiting results Will continue vancomycin at this time Will repeat blood work 05-23-23.    Synthia Innocent NP [facility]  call [phone]

## 2023-05-20 NOTE — Progress Notes (Signed)
Pt was attempted, bed was not able to fit into the rooms. Pt was bed bound not able to transfer.

## 2023-05-21 ENCOUNTER — Other Ambulatory Visit (HOSPITAL_COMMUNITY)
Admission: RE | Admit: 2023-05-21 | Discharge: 2023-05-21 | Disposition: A | Discharge status: Home / Self Care | Payer: Medicare PPO | Source: Skilled Nursing Facility | Attending: Adult Health | Admitting: Adult Health

## 2023-05-21 DIAGNOSIS — Z792 Long term (current) use of antibiotics: Secondary | ICD-10-CM | POA: Diagnosis not present

## 2023-05-21 DIAGNOSIS — N1831 Chronic kidney disease, stage 3a: Secondary | ICD-10-CM | POA: Diagnosis not present

## 2023-05-21 DIAGNOSIS — K2281 Esophageal polyp: Secondary | ICD-10-CM | POA: Diagnosis not present

## 2023-05-21 DIAGNOSIS — R5081 Fever presenting with conditions classified elsewhere: Secondary | ICD-10-CM | POA: Insufficient documentation

## 2023-05-21 LAB — BASIC METABOLIC PANEL
Anion gap: 9 (ref 5–15)
BUN: 39 mg/dL — ABNORMAL HIGH (ref 8–23)
CO2: 24 mmol/L (ref 22–32)
Calcium: 8.6 mg/dL — ABNORMAL LOW (ref 8.9–10.3)
Chloride: 111 mmol/L (ref 98–111)
Creatinine, Ser: 1.17 mg/dL — ABNORMAL HIGH (ref 0.44–1.00)
GFR, Estimated: 47 mL/min — ABNORMAL LOW (ref >60)
Glucose, Bld: 95 mg/dL (ref 70–99)
Potassium: 4.4 mmol/L (ref 3.5–5.1)
Sodium: 144 mmol/L (ref 135–145)

## 2023-05-21 LAB — CULTURE, BLOOD (ROUTINE X 2): Culture: NO GROWTH

## 2023-05-21 LAB — VANCOMYCIN, TROUGH: Vancomycin Tr: 30 ug/mL (ref 15–20)

## 2023-05-22 LAB — CULTURE, BLOOD (ROUTINE X 2)

## 2023-05-23 ENCOUNTER — Encounter: Payer: Self-pay | Admitting: Adult Health

## 2023-05-23 ENCOUNTER — Non-Acute Institutional Stay (SKILLED_NURSING_FACILITY): Payer: Medicare PPO | Admitting: Adult Health

## 2023-05-23 ENCOUNTER — Ambulatory Visit (HOSPITAL_COMMUNITY)
Admission: RE | Admit: 2023-05-23 | Discharge: 2023-05-23 | Disposition: A | Discharge status: Home / Self Care | Payer: Medicare PPO | Source: Ambulatory Visit | Attending: Adult Health | Admitting: Adult Health

## 2023-05-23 ENCOUNTER — Other Ambulatory Visit (HOSPITAL_COMMUNITY): Payer: Self-pay | Admitting: Adult Health

## 2023-05-23 ENCOUNTER — Other Ambulatory Visit (HOSPITAL_COMMUNITY)
Admission: RE | Admit: 2023-05-23 | Discharge: 2023-05-23 | Disposition: A | Discharge status: Home / Self Care | Payer: Medicare PPO | Source: Skilled Nursing Facility | Attending: Adult Health | Admitting: Adult Health

## 2023-05-23 DIAGNOSIS — M79604 Pain in right leg: Secondary | ICD-10-CM | POA: Diagnosis not present

## 2023-05-23 DIAGNOSIS — J449 Chronic obstructive pulmonary disease, unspecified: Secondary | ICD-10-CM | POA: Diagnosis not present

## 2023-05-23 DIAGNOSIS — M6281 Muscle weakness (generalized): Secondary | ICD-10-CM | POA: Diagnosis not present

## 2023-05-23 DIAGNOSIS — D649 Anemia, unspecified: Secondary | ICD-10-CM | POA: Insufficient documentation

## 2023-05-23 DIAGNOSIS — I10 Essential (primary) hypertension: Secondary | ICD-10-CM | POA: Diagnosis not present

## 2023-05-23 DIAGNOSIS — M79661 Pain in right lower leg: Secondary | ICD-10-CM | POA: Insufficient documentation

## 2023-05-23 DIAGNOSIS — R279 Unspecified lack of coordination: Secondary | ICD-10-CM | POA: Diagnosis not present

## 2023-05-23 DIAGNOSIS — R2681 Unsteadiness on feet: Secondary | ICD-10-CM | POA: Diagnosis not present

## 2023-05-23 DIAGNOSIS — Z9181 History of falling: Secondary | ICD-10-CM | POA: Diagnosis not present

## 2023-05-23 DIAGNOSIS — J9611 Chronic respiratory failure with hypoxia: Secondary | ICD-10-CM

## 2023-05-23 DIAGNOSIS — J9612 Chronic respiratory failure with hypercapnia: Secondary | ICD-10-CM

## 2023-05-23 DIAGNOSIS — R509 Fever, unspecified: Secondary | ICD-10-CM | POA: Insufficient documentation

## 2023-05-23 DIAGNOSIS — R498 Other voice and resonance disorders: Secondary | ICD-10-CM | POA: Diagnosis not present

## 2023-05-23 DIAGNOSIS — R627 Adult failure to thrive: Secondary | ICD-10-CM | POA: Diagnosis not present

## 2023-05-23 LAB — CBC WITH DIFFERENTIAL/PLATELET
Abs Immature Granulocytes: 0.14 10*3/uL — ABNORMAL HIGH (ref 0.00–0.07)
Basophils Absolute: 0 10*3/uL (ref 0.0–0.1)
Basophils Relative: 0 %
Eosinophils Absolute: 0.1 10*3/uL (ref 0.0–0.5)
Eosinophils Relative: 1 %
HCT: 25.8 % — ABNORMAL LOW (ref 36.0–46.0)
Hemoglobin: 7.7 g/dL — ABNORMAL LOW (ref 12.0–15.0)
Immature Granulocytes: 1 %
Lymphocytes Relative: 2 %
Lymphs Abs: 0.5 10*3/uL — ABNORMAL LOW (ref 0.7–4.0)
MCH: 24.9 pg — ABNORMAL LOW (ref 26.0–34.0)
MCHC: 29.8 g/dL — ABNORMAL LOW (ref 30.0–36.0)
MCV: 83.5 fL (ref 80.0–100.0)
Monocytes Absolute: 1 10*3/uL (ref 0.1–1.0)
Monocytes Relative: 5 %
Neutro Abs: 18.2 10*3/uL — ABNORMAL HIGH (ref 1.7–7.7)
Neutrophils Relative %: 91 %
Platelets: 405 10*3/uL — ABNORMAL HIGH (ref 150–400)
RBC: 3.09 MIL/uL — ABNORMAL LOW (ref 3.87–5.11)
RDW: 16.7 % — ABNORMAL HIGH (ref 11.5–15.5)
WBC: 20 10*3/uL — ABNORMAL HIGH (ref 4.0–10.5)
nRBC: 0.6 % — ABNORMAL HIGH (ref 0.0–0.2)

## 2023-05-23 LAB — VANCOMYCIN, RANDOM: Vancomycin Rm: >60 ug/mL

## 2023-05-23 LAB — BASIC METABOLIC PANEL
Anion gap: 6 (ref 5–15)
BUN: 45 mg/dL — ABNORMAL HIGH (ref 8–23)
CO2: 22 mmol/L (ref 22–32)
Calcium: 8.3 mg/dL — ABNORMAL LOW (ref 8.9–10.3)
Chloride: 113 mmol/L — ABNORMAL HIGH (ref 98–111)
Creatinine, Ser: 1.86 mg/dL — ABNORMAL HIGH (ref 0.44–1.00)
GFR, Estimated: 27 mL/min — ABNORMAL LOW (ref >60)
Glucose, Bld: 112 mg/dL — ABNORMAL HIGH (ref 70–99)
Potassium: 4.2 mmol/L (ref 3.5–5.1)
Sodium: 141 mmol/L (ref 135–145)

## 2023-05-23 LAB — MAGNESIUM: Magnesium: 1.8 mg/dL (ref 1.7–2.4)

## 2023-05-23 LAB — D-DIMER, QUANTITATIVE: D-Dimer, Quant: 3.56 ug/mL-FEU — ABNORMAL HIGH (ref 0.00–0.50)

## 2023-05-23 NOTE — Progress Notes (Unsigned)
Location:  [facility] Room Number: 149 Place of Service:  SNF (31)   CODE STATUS: dnr   Allergies  Allergen Reactions   Fenofibrate Nausea And Vomiting   Pravastatin Sodium Nausea And Vomiting   Sulfonamide Derivatives Other (See Comments)    unknown   Ciprofloxacin Rash    Chief Complaint  Patient presents with   Acute Visit    Follow up status     HPI:  She continues to decline. Her wbc is now 20,000. Her po intake is poor. She remains in bed and has a low grade temp. She has been on IV vanc; her renal function is worsening. Her d-dimer is elevated at 3.56.  I have spoken with her daughter regarding her overall health status. This does represent a terminal status with less than 6 months to live. Her daughter would like to have her are changed to comfort care. We will stop medications as able.   Past Medical History:  Diagnosis Date   Abnormality of gait 06/06/2014   Allergic rhinitis    Anemia    Ankle fracture, right    Anxiety    Arm fracture, left    Arthritis    abnormal gait    COPD (chronic obstructive pulmonary disease) (HCC)    chronic CO2 retention, decreased DLCO    Cystic acne    adult    Degenerative disc disease, cervical    syrinx C3-7   Degenerative disc disease, lumbar    L5 nerve impingement    Dementia (HCC)    Depression    DNR (do not resuscitate)    Fracture of right proximal fibula 07/13/18 09/05/2018   GERD (gastroesophageal reflux disease)    Heme positive stool 10/10/2017   History of colon surgery    right hemicolectomy for high-grade adenomas in right colon 2013   Hyperglycemia    Hypertension    Low back pain    Mediastinal lymphadenopathy 1/9   resolving    Memory difficulty 09/20/2014   mild dementia   Onychomycosis    OSA on CPAP    Sleep apnea    stop bang score 6    Past Surgical History:  Procedure Laterality Date   ABDOMINAL HYSTERECTOMY  1976   secondary to bleeding    APPENDECTOMY      COLON RESECTION  08/18/2012   Procedure: HAND ASSISTED LAPAROSCOPIC COLON RESECTION;  Surgeon: Dalia Heading, MD;  Location: [facility];  Service: General;  Laterality: N/A;   COLONOSCOPY  07/20/2012   Dr. Jena Gauss: multiple colonic polyps with large polyps on right side, s/p saline-assisted debulking piecemeal polypectomy and ablation. Not all removed. Path with tubulovillous adenomas, high grade dysplasia.    COLONOSCOPY N/A 03/09/2013   [id number] polyps-tubular adenomas. S/p right hemicolectomy. next tcs 02/2018   epidural steroids     ESOPHAGOGASTRODUODENOSCOPY (EGD) WITH ESOPHAGEAL DILATION N/A 02/06/2014   Procedure: ESOPHAGOGASTRODUODENOSCOPY (EGD) WITH ESOPHAGEAL DILATION;  Surgeon: Corbin Ade, MD;  Location: [facility];  Service: Endoscopy;  Laterality: N/A;  9:30   OOPHORECTOMY  1976   ORIF ANKLE FRACTURE  01/18/2012   Procedure: OPEN REDUCTION INTERNAL FIXATION (ORIF) ANKLE FRACTURE;  Surgeon: Fuller Canada, MD;  Location: [facility];  Service: Orthopedics;  Laterality: Left;   ORIF ANKLE FRACTURE Right 01/13/2016   Procedure: OPEN REDUCTION INTERNAL FIXATION (ORIF) ANKLE FRACTURE;  Surgeon: Vickki Hearing, MD;  Location: [facility];  Service: Orthopedics;  Laterality: Right;    Social History  Socioeconomic History   Marital status: Widowed    Spouse name: Not on file   Number of children: 4   Years of education: college 1   Highest education level: Not on file  Occupational History   Occupation: employed in the tobacco industry     Employer: RETIRED   Occupation: retired  Tobacco Use   Smoking status: Former    Packs/day: 1.50    Years: 40.00    Additional pack years: 0.00    Total pack years: 60.00    Types: Cigarettes    Quit date: 06/21/2006    Years since quitting: 16.9   Smokeless tobacco: Never  Vaping Use   Vaping Use: Never used  Substance and Sexual Activity   Alcohol use: No   Drug use: No   Sexual activity: Not Currently  Other Topics Concern   Not on file   Social History Narrative   Long term resident of [facility]    Social Determinants of Health   Financial Resource Strain: Low Risk  (12/02/2020)   Overall Financial Resource Strain (CARDIA)    Difficulty of Paying Living Expenses: Not hard at all  Food Insecurity: No Food Insecurity (05/05/2023)   Hunger Vital Sign    Worried About Running Out of Food in the Last Year: Never true    Ran Out of Food in the Last Year: Never true  Transportation Needs: No Transportation Needs (05/05/2023)   PRAPARE - Administrator, Civil Service (Medical): No    Lack of Transportation (Non-Medical): No  Physical Activity: Inactive (12/02/2020)   Exercise Vital Sign    Days of Exercise per Week: 0 days    Minutes of Exercise per Session: 0 min  Stress: No Stress Concern Present (12/02/2020)   [employer] of Occupational Health - Occupational Stress Questionnaire    Feeling of Stress : Not at all  Social Connections: Socially Isolated (12/02/2020)   Social Connection and Isolation Panel [NHANES]    Frequency of Communication with Friends and Family: Three times a week    Frequency of Social Gatherings with Friends and Family: Once a week    Attends Religious Services: Never    Database administrator or Organizations: No    Attends Banker Meetings: Never    Marital Status: Widowed  Intimate Partner Violence: Not At Risk (05/05/2023)   Humiliation, Afraid, Rape, and Kick questionnaire    Fear of Current or Ex-Partner: No    Emotionally Abused: No    Physically Abused: No    Sexually Abused: No   Family History  Problem Relation Age of Onset   Stomach cancer Father    Cancer Father    Cancer Mother        brain    Breast cancer Mother    Arthritis Other       VITAL SIGNS BP (!) 160/55   Pulse 93   Temp 99.6 F (37.6 C)   Resp 20   Ht 6\' 1"  (1.854 m)   Wt 157 lb 12.8 oz (71.6 kg)   SpO2 96%   BMI 20.82 kg/m   Outpatient Encounter Medications as of 05/23/2023   Medication Sig   calcium-vitamin D (OSCAL WITH D) 500-5 MG-MCG tablet Take 1 tablet by mouth daily at 12 noon.   cyanocobalamin (VITAMIN B12) 1000 MCG tablet Take 1,000 mcg by mouth daily.   donepezil (ARICEPT) 10 MG tablet TAKE 1 TABLET AT BEDTIME   loratadine (CLARITIN) 10 MG tablet  Take 10 mg by mouth daily.   mirabegron ER (MYRBETRIQ) 50 MG TB24 tablet Take 50 mg by mouth daily.   NON FORMULARY Diet: Regular diet with chopped meats and gravy to all meats.   omeprazole (PRILOSEC) 40 MG capsule TAKE 1 CAPSULE EVERY DAY   OXYGEN Inhale 2 L into the lungs continuous.   potassium chloride SA (KLOR-CON M) 20 MEQ tablet Take 20 mEq by mouth daily.   STRIVERDI RESPIMAT 2.5 MCG/ACT AERS INHALE 2 PUFFS ONE TIME DAILY AT THE SAME TIME   vancomycin IVPB Inject 1,250 mg into the vein every 12 (twelve) hours.   verapamil (CALAN-SR) 180 MG CR tablet Take 180 mg by mouth daily.   No facility-administered encounter medications on file as of 05/23/2023.     SIGNIFICANT DIAGNOSTIC EXAMS  EXAM  05-18-23: ct of chest abdomen and pelvis:  1. No acute findings in the chest or abdomen. 2. Patulous esophagus contains layering ingested material. 3. Aortic Atherosclerosis  and Emphysema. Coronary artery calcifications. Assessment for potential risk factor modification, dietary therapy or pharmacologic therapy may be warranted, if clinically indicated.    LABS REVIEWED PREVIOUS  05-21-22: wbc 4.8; hgb 11.; hct 37.5; mcv 87.2 plt 229; glucose 74; bun 26; creat 1.05 ;k+ 3.8; na++ 140; ca 9.2; gfr 54  06-10-22: vitamin D 39.61 10-20-22: wbc 13.6; hgb 9.7; hct 32.0; mcv 86.3; plt 205; glucose 09; bun 25; creat 1.20; k+ 3.7; na++ 139; ca 8.5; gfr 45; chol 110; ldl 63; trig 59; hdl 35  10-28-22: chol 110; ldl 63; trig 59; hdl 35 02-03-23; wbc 3.4; hgb 9.3; hct 30.9; mcv 86.1 plt 200; glucose 72; bun 24; creat 0.81; k+ 3.8; na++ 142; ca 8.7; gfr >60    TODAY  03-10-23: wbc 4.4; hgb 9.9; hct 32.7; mcv 85.4 plt 210;  glucose 80; bun 22; creat 0.79 k+ 4.0; na++ 139; ca 9.1; gfr >60; protein 6.1 albumin 3.4; tsh 0.918; vitamin B 12: 250; vitamin D 42.92;  05-03-23: wbc 7.3; hgb 9.5; hct 31.7; mcv 86.1 plt 240; glucose 101; bun 16; creat 0.84; k+ 3.7; na++ 139; ca 8.9; gfr >60; protein 6.4 albumin 3.3 BNP 51  05-05-23: wbc 8.7; hgb 9.6; hct 32.0; mcv 86.7 plt 253; glucose 106; bun 22; creat 1.11; k+ 4.2; na++ 141; ca 8.9; gfr 50; protein 6.3 albumin 3.0; mag 1.6 blood culture: no growth 05-10-23: wbc 5.8; hgb 10.1; hct 33.9; mcv 87.4 plt 327 05-18-23: wbc 16.5; hgb 9.0; hct 31.3; mcv 87.4 plt 322; glucose 121; bun 43; creat 1.33; k+ 4.2; na++ 144; ca 8.7; gfr 50 protein 6.6 albumin 3.0; ast 67 urine culture: multiple 05-20-23: wbc 17.5; hgb 8.3; hct 28.6; mcv 86.4 plt 346  05-23-23: wbc 20.0 hgb 7.7; hct 25.8; mcv 83.5 plt 405; glucose 112; vun 45; creat 1.86; k+ 4.2; na++ 141; ca 8.3 gfr 27 mag 1.8; d-dimer 3.56   Review of Systems  Reason unable to perform ROS: unable to fully participate.   Physical Exam Constitutional:      General: She is not in acute distress.    Appearance: She is well-developed. She is not diaphoretic.  Neck:     Thyroid: No thyromegaly.  Cardiovascular:     Rate and Rhythm: Normal rate and regular rhythm.     Pulses: Normal pulses.     Heart sounds: Normal heart sounds.  Pulmonary:     Effort: Pulmonary effort is normal. No respiratory distress.     Breath sounds: Normal breath sounds.  Abdominal:  General: Bowel sounds are normal. There is no distension.     Palpations: Abdomen is soft.     Tenderness: There is no abdominal tenderness.  Musculoskeletal:     Cervical back: Neck supple.     Right lower leg: No edema.     Left lower leg: No edema.  Lymphadenopathy:     Cervical: No cervical adenopathy.  Skin:    General: Skin is warm and dry.     Comments: Right foot bruised swollen and cool to touch.   Neurological:     Mental Status: She is alert. Mental status is at  baseline.  Psychiatric:        Mood and Affect: Mood normal.     ASSESSMENT/ PLAN:   TODAY  Failure to thrive in adult Chronic respiratory failure with hypoxia and hypercapnia COPD with hypoxia  Will focus her care upon comfort only: will stop the following medications:  Calcium; aricept; vitamin B12; potassium; vancomycin; asa; will remove picc line.   Synthia Innocent NP [facility]  call [phone]

## 2023-05-24 DIAGNOSIS — R498 Other voice and resonance disorders: Secondary | ICD-10-CM | POA: Diagnosis not present

## 2023-05-24 DIAGNOSIS — R2681 Unsteadiness on feet: Secondary | ICD-10-CM | POA: Diagnosis not present

## 2023-05-24 DIAGNOSIS — M6281 Muscle weakness (generalized): Secondary | ICD-10-CM | POA: Diagnosis not present

## 2023-05-24 DIAGNOSIS — Z9181 History of falling: Secondary | ICD-10-CM | POA: Diagnosis not present

## 2023-05-24 DIAGNOSIS — R279 Unspecified lack of coordination: Secondary | ICD-10-CM | POA: Diagnosis not present

## 2023-05-24 DIAGNOSIS — J9611 Chronic respiratory failure with hypoxia: Secondary | ICD-10-CM | POA: Diagnosis not present

## 2023-05-24 LAB — CULTURE, BLOOD (ROUTINE X 2)
Special Requests: ADEQUATE
Special Requests: ADEQUATE

## 2023-05-27 ENCOUNTER — Other Ambulatory Visit: Payer: Self-pay | Admitting: Adult Health

## 2023-05-27 MED ORDER — MORPHINE SULFATE (CONCENTRATE) 20 MG/ML PO SOLN: 5.0000 mg | ORAL | 0 refills | Status: AC | PRN

## 2023-06-08 ENCOUNTER — Non-Acute Institutional Stay: Payer: Self-pay | Admitting: Adult Health

## 2023-06-08 ENCOUNTER — Encounter: Payer: Self-pay | Admitting: Adult Health

## 2023-06-08 DIAGNOSIS — J9612 Chronic respiratory failure with hypercapnia: Secondary | ICD-10-CM | POA: Diagnosis not present

## 2023-06-08 DIAGNOSIS — F0150 Vascular dementia without behavioral disturbance: Secondary | ICD-10-CM | POA: Diagnosis not present

## 2023-06-08 DIAGNOSIS — J449 Chronic obstructive pulmonary disease, unspecified: Secondary | ICD-10-CM | POA: Diagnosis not present

## 2023-06-08 DIAGNOSIS — R627 Adult failure to thrive: Secondary | ICD-10-CM | POA: Diagnosis not present

## 2023-06-08 DIAGNOSIS — J9611 Chronic respiratory failure with hypoxia: Secondary | ICD-10-CM

## 2023-06-08 NOTE — Progress Notes (Signed)
Location:  [facility] Room Number: 149W Place of Service:  SNF (31)   CODE STATUS: DNR  Allergies  Allergen Reactions   Fenofibrate Nausea And Vomiting   Pravastatin Sodium Nausea And Vomiting   Sulfonamide Derivatives Other (See Comments)    unknown   Ciprofloxacin Rash    Chief Complaint  Patient presents with   Medical Management of Chronic Issues           Failure to thrive in adult    Chronic respiratory failure with hypoxia hypercapnia   COPD with hypoxia    Vascular dementia without behavioral disturbance     HPI:  She is a 82 year old long term resident of this facility being seen for the management of her chronic illnesses: Failure to thrive in adult    Chronic respiratory failure with hypoxia hypercapnia   COPD with hypoxia    Vascular dementia without behavioral disturbance. The focus of her care is comfort. She is resting quietly; skin is warm and dry. There are no indications of pain or distress.   Past Medical History:  Diagnosis Date   Abnormality of gait 06/06/2014   Allergic rhinitis    Anemia    Ankle fracture, right    Anxiety    Arm fracture, left    Arthritis    abnormal gait    COPD (chronic obstructive pulmonary disease) (HCC)    chronic CO2 retention, decreased DLCO    Cystic acne    adult    Degenerative disc disease, cervical    syrinx C3-7   Degenerative disc disease, lumbar    L5 nerve impingement    Dementia (HCC)    Depression    DNR (do not resuscitate)    Fracture of right proximal fibula 07/13/18 09/05/2018   GERD (gastroesophageal reflux disease)    Heme positive stool 10/10/2017   History of colon surgery    right hemicolectomy for high-grade adenomas in right colon 2013   Hyperglycemia    Hypertension    Low back pain    Mediastinal lymphadenopathy 1/9   resolving    Memory difficulty 09/20/2014   mild dementia   Onychomycosis    OSA on CPAP    Sleep apnea    stop bang score 6    Past  Surgical History:  Procedure Laterality Date   ABDOMINAL HYSTERECTOMY  1976   secondary to bleeding    APPENDECTOMY     COLON RESECTION  08/18/2012   Procedure: HAND ASSISTED LAPAROSCOPIC COLON RESECTION;  Surgeon: Dalia Heading, MD;  Location: [facility];  Service: General;  Laterality: N/A;   COLONOSCOPY  07/20/2012   Dr. Jena Gauss: multiple colonic polyps with large polyps on right side, s/p saline-assisted debulking piecemeal polypectomy and ablation. Not all removed. Path with tubulovillous adenomas, high grade dysplasia.    COLONOSCOPY N/A 03/09/2013   [id number] polyps-tubular adenomas. S/p right hemicolectomy. next tcs 02/2018   epidural steroids     ESOPHAGOGASTRODUODENOSCOPY (EGD) WITH ESOPHAGEAL DILATION N/A 02/06/2014   Procedure: ESOPHAGOGASTRODUODENOSCOPY (EGD) WITH ESOPHAGEAL DILATION;  Surgeon: Corbin Ade, MD;  Location: [facility];  Service: Endoscopy;  Laterality: N/A;  9:30   OOPHORECTOMY  1976   ORIF ANKLE FRACTURE  01/18/2012   Procedure: OPEN REDUCTION INTERNAL FIXATION (ORIF) ANKLE FRACTURE;  Surgeon: Fuller Canada, MD;  Location: [facility];  Service: Orthopedics;  Laterality: Left;   ORIF ANKLE FRACTURE Right 01/13/2016   Procedure: OPEN REDUCTION INTERNAL FIXATION (ORIF) ANKLE FRACTURE;  Surgeon:  Vickki Hearing, MD;  Location: [facility];  Service: Orthopedics;  Laterality: Right;    Social History   Socioeconomic History   Marital status: Widowed    Spouse name: Not on file   Number of children: 4   Years of education: college 1   Highest education level: Not on file  Occupational History   Occupation: employed in the tobacco industry     Employer: RETIRED   Occupation: retired  Tobacco Use   Smoking status: Former    Current packs/day: 0.00    Average packs/day: 1.5 packs/day for 40.0 years (60.0 ttl pk-yrs)    Types: Cigarettes    Start date: 06/21/1966    Quit date: 06/21/2006    Years since quitting: 16.9   Smokeless tobacco: Never  Vaping Use   Vaping  status: Never Used  Substance and Sexual Activity   Alcohol use: No   Drug use: No   Sexual activity: Not Currently  Other Topics Concern   Not on file  Social History Narrative   Long term resident of [facility]    Social Determinants of Health   Financial Resource Strain: Low Risk  (12/02/2020)   Overall Financial Resource Strain (CARDIA)    Difficulty of Paying Living Expenses: Not hard at all  Food Insecurity: No Food Insecurity (05/05/2023)   Hunger Vital Sign    Worried About Running Out of Food in the Last Year: Never true    Ran Out of Food in the Last Year: Never true  Transportation Needs: No Transportation Needs (05/05/2023)   PRAPARE - Administrator, Civil Service (Medical): No    Lack of Transportation (Non-Medical): No  Physical Activity: Inactive (12/02/2020)   Exercise Vital Sign    Days of Exercise per Week: 0 days    Minutes of Exercise per Session: 0 min  Stress: No Stress Concern Present (12/02/2020)   [employer] of Occupational Health - Occupational Stress Questionnaire    Feeling of Stress : Not at all  Social Connections: Socially Isolated (12/02/2020)   Social Connection and Isolation Panel [NHANES]    Frequency of Communication with Friends and Family: Three times a week    Frequency of Social Gatherings with Friends and Family: Once a week    Attends Religious Services: Never    Database administrator or Organizations: No    Attends Banker Meetings: Never    Marital Status: Widowed  Intimate Partner Violence: Not At Risk (05/05/2023)   Humiliation, Afraid, Rape, and Kick questionnaire    Fear of Current or Ex-Partner: No    Emotionally Abused: No    Physically Abused: No    Sexually Abused: No   Family History  Problem Relation Age of Onset   Stomach cancer Father    Cancer Father    Cancer Mother        brain    Breast cancer Mother    Arthritis Other       VITAL SIGNS BP 119/70   Pulse 91   Temp 98.9 F (37.2  C)   Resp 20   Ht 6\' 1"  (1.854 m)   Wt 157 lb 12.8 oz (71.6 kg)   SpO2 90%   BMI 20.82 kg/m   Outpatient Encounter Medications as of   Medication Sig   loratadine (CLARITIN) 10 MG tablet Take 10 mg by mouth daily.   mirabegron ER (MYRBETRIQ) 50 MG TB24 tablet Take 50 mg by mouth daily.   morphine (  ROXANOL) 20 MG/ML concentrated solution Take 0.25 mLs (5 mg total) by mouth every 4 (four) hours as needed for severe pain.   NON FORMULARY Diet: Regular diet with chopped meats and gravy to all meats.   omeprazole (PRILOSEC) 40 MG capsule TAKE 1 CAPSULE EVERY DAY   OXYGEN Inhale 2 L into the lungs continuous.   STRIVERDI RESPIMAT 2.5 MCG/ACT AERS INHALE 2 PUFFS ONE TIME DAILY AT THE SAME TIME   vancomycin IVPB Inject 1,250 mg into the vein every 12 (twelve) hours.   verapamil (CALAN-SR) 180 MG CR tablet Take 180 mg by mouth daily.   [DISCONTINUED] calcium-vitamin D (OSCAL WITH D) 500-5 MG-MCG tablet Take 1 tablet by mouth daily at 12 noon.   [DISCONTINUED] cyanocobalamin (VITAMIN B12) 1000 MCG tablet Take 1,000 mcg by mouth daily.   [DISCONTINUED] donepezil (ARICEPT) 10 MG tablet TAKE 1 TABLET AT BEDTIME   [DISCONTINUED] potassium chloride SA (KLOR-CON M) 20 MEQ tablet Take 20 mEq by mouth daily.   No facility-administered encounter medications on file as of .     SIGNIFICANT DIAGNOSTIC EXAMS  PREVIOUS   05-18-23: ct of chest abdomen and pelvis:  1. No acute findings in the chest or abdomen. 2. Patulous esophagus contains layering ingested material. 3. Aortic Atherosclerosis  and Emphysema. Coronary artery calcifications. Assessment for potential risk factor modification, dietary therapy or pharmacologic therapy may be warranted, if clinically indicated.  NO NEW EXAMS     LABS REVIEWED PREVIOUS  05-21-22: wbc 4.8; hgb 11.; hct 37.5; mcv 87.2 plt 229; glucose 74; bun 26; creat 1.05 ;k+ 3.8; na++ 140; ca 9.2; gfr 54  06-10-22: vitamin D 39.61 10-20-22: wbc 13.6; hgb  9.7; hct 32.0; mcv 86.3; plt 205; glucose 09; bun 25; creat 1.20; k+ 3.7; na++ 139; ca 8.5; gfr 45; chol 110; ldl 63; trig 59; hdl 35  10-28-22: chol 110; ldl 63; trig 59; hdl 35 02-03-23; wbc 3.4; hgb 9.3; hct 30.9; mcv 86.1 plt 200; glucose 72; bun 24; creat 0.81; k+ 3.8; na++ 142; ca 8.7; gfr >60   03-10-23: wbc 4.4; hgb 9.9; hct 32.7; mcv 85.4 plt 210; glucose 80; bun 22; creat 0.79 k+ 4.0; na++ 139; ca 9.1; gfr >60; protein 6.1 albumin 3.4; tsh 0.918; vitamin B 12: 250; vitamin D 42.92;  05-03-23: wbc 7.3; hgb 9.5; hct 31.7; mcv 86.1 plt 240; glucose 101; bun 16; creat 0.84; k+ 3.7; na++ 139; ca 8.9; gfr >60; protein 6.4 albumin 3.3 BNP 51  05-05-23: wbc 8.7; hgb 9.6; hct 32.0; mcv 86.7 plt 253; glucose 106; bun 22; creat 1.11; k+ 4.2; na++ 141; ca 8.9; gfr 50; protein 6.3 albumin 3.0; mag 1.6 blood culture: no growth 05-10-23: wbc 5.8; hgb 10.1; hct 33.9; mcv 87.4 plt 327 05-18-23: wbc 16.5; hgb 9.0; hct 31.3; mcv 87.4 plt 322; glucose 121; bun 43; creat 1.33; k+ 4.2; na++ 144; ca 8.7; gfr 50 protein 6.6 albumin 3.0; ast 67 urine culture: multiple 05-20-23: wbc 17.5; hgb 8.3; hct 28.6; mcv 86.4 plt 346  05-23-23: wbc 20.0 hgb 7.7; hct 25.8; mcv 83.5 plt 405; glucose 112; vun 45; creat 1.86; k+ 4.2; na++ 141; ca 8.3 gfr 27 mag 1.8; d-dimer 3.56   NO NEW LABS.    Review of Systems  Reason unable to perform ROS: unable to participate.   Physical Exam Constitutional:      General: She is not in acute distress.    Appearance: She is well-developed. She is not diaphoretic.     Comments: Does  not respond to verbal stimuli   Neck:     Thyroid: No thyromegaly.  Cardiovascular:     Rate and Rhythm: Normal rate and regular rhythm.     Pulses: Normal pulses.     Heart sounds: Normal heart sounds.  Pulmonary:     Effort: Pulmonary effort is normal. No respiratory distress.     Breath sounds: Normal breath sounds.  Abdominal:     General: Bowel sounds are normal. There is no distension.     Palpations:  Abdomen is soft.     Tenderness: There is no abdominal tenderness.  Musculoskeletal:     Cervical back: Neck supple.     Right lower leg: No edema.     Left lower leg: No edema.  Lymphadenopathy:     Cervical: No cervical adenopathy.  Skin:    General: Skin is warm and dry.     ASSESSMENT/ PLAN:  TODAY  Failure to thrive in adult Chronic respiratory failure with hypoxia hypercapnia COPD with hypoxia Vascular dementia without behavioral disturbance  She is comfortable at this time Will not make changes at this times Will continue to monitor her status    Synthia Innocent NP [facility]  call [phone]

## 2023-06-10 ENCOUNTER — Ambulatory Visit: Payer: Medicare PPO | Admitting: Internal Medicine

## 2023-06-16 DEATH — deceased
